# Patient Record
Sex: Male | Born: 1940
Health system: Southern US, Community
[De-identification: ages and names within clinical notes are randomized; demographics above are authoritative.]

## PROBLEM LIST (undated history)

## (undated) DIAGNOSIS — I4891 Unspecified atrial fibrillation: Secondary | ICD-10-CM

## (undated) DIAGNOSIS — I509 Heart failure, unspecified: Secondary | ICD-10-CM

## (undated) DIAGNOSIS — J189 Pneumonia, unspecified organism: Secondary | ICD-10-CM

## (undated) DIAGNOSIS — R739 Hyperglycemia, unspecified: Secondary | ICD-10-CM

## (undated) DIAGNOSIS — I639 Cerebral infarction, unspecified: Secondary | ICD-10-CM

## (undated) DIAGNOSIS — T7840XA Allergy, unspecified, initial encounter: Secondary | ICD-10-CM

## (undated) DIAGNOSIS — I10 Essential (primary) hypertension: Secondary | ICD-10-CM

## (undated) DIAGNOSIS — I447 Left bundle-branch block, unspecified: Secondary | ICD-10-CM

## (undated) DIAGNOSIS — K635 Polyp of colon: Secondary | ICD-10-CM

## (undated) DIAGNOSIS — N189 Chronic kidney disease, unspecified: Secondary | ICD-10-CM

## (undated) DIAGNOSIS — K5790 Diverticulosis of intestine, part unspecified, without perforation or abscess without bleeding: Secondary | ICD-10-CM

## (undated) DIAGNOSIS — E785 Hyperlipidemia, unspecified: Secondary | ICD-10-CM

## (undated) HISTORY — PX: KNEE ARTHROSCOPY: SUR90

## (undated) HISTORY — DX: Diverticulosis of intestine, part unspecified, without perforation or abscess without bleeding: K57.90

## (undated) HISTORY — PX: NECK SURGERY: SHX720

## (undated) HISTORY — DX: Allergy, unspecified, initial encounter: T78.40XA

## (undated) HISTORY — DX: Polyp of colon: K63.5

## (undated) HISTORY — DX: Hyperlipidemia, unspecified: E78.5

## (undated) HISTORY — DX: Unspecified atrial fibrillation: I48.91

## (undated) HISTORY — PX: AMPUTATION TOE: SHX6595

## (undated) HISTORY — PX: CATARACT EXTRACTION: SUR2

## (undated) HISTORY — PX: COLON SURGERY: SHX602

---
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
## 1999-06-24 ENCOUNTER — Emergency Department (HOSPITAL_COMMUNITY): Admission: EM | Admit: 1999-06-24 | Discharge: 1999-06-24 | Payer: Self-pay | Admitting: Emergency Medicine

## 1999-12-30 ENCOUNTER — Ambulatory Visit (HOSPITAL_COMMUNITY): Admission: RE | Admit: 1999-12-30 | Discharge: 1999-12-30 | Payer: Self-pay | Admitting: Internal Medicine

## 2000-06-22 ENCOUNTER — Emergency Department (HOSPITAL_COMMUNITY): Admission: EM | Admit: 2000-06-22 | Discharge: 2000-06-22 | Payer: Self-pay | Admitting: Emergency Medicine

## 2000-06-22 ENCOUNTER — Encounter: Payer: Self-pay | Admitting: Emergency Medicine

## 2005-03-31 ENCOUNTER — Emergency Department (HOSPITAL_COMMUNITY): Admission: EM | Admit: 2005-03-31 | Discharge: 2005-04-01 | Payer: Self-pay | Admitting: Emergency Medicine

## 2007-01-08 ENCOUNTER — Ambulatory Visit (HOSPITAL_COMMUNITY): Admission: RE | Admit: 2007-01-08 | Discharge: 2007-01-08 | Payer: Self-pay | Admitting: Ophthalmology

## 2007-01-21 ENCOUNTER — Emergency Department (HOSPITAL_COMMUNITY): Admission: EM | Admit: 2007-01-21 | Discharge: 2007-01-21 | Payer: Self-pay | Admitting: *Deleted

## 2009-06-15 ENCOUNTER — Ambulatory Visit (HOSPITAL_COMMUNITY): Admission: RE | Admit: 2009-06-15 | Discharge: 2009-06-16 | Payer: Self-pay | Admitting: Urology

## 2010-03-21 DIAGNOSIS — G589 Mononeuropathy, unspecified: Secondary | ICD-10-CM | POA: Insufficient documentation

## 2010-03-21 DIAGNOSIS — E785 Hyperlipidemia, unspecified: Secondary | ICD-10-CM | POA: Insufficient documentation

## 2010-03-21 DIAGNOSIS — N529 Male erectile dysfunction, unspecified: Secondary | ICD-10-CM | POA: Insufficient documentation

## 2010-03-21 DIAGNOSIS — E1139 Type 2 diabetes mellitus with other diabetic ophthalmic complication: Secondary | ICD-10-CM | POA: Insufficient documentation

## 2010-03-21 DIAGNOSIS — M5412 Radiculopathy, cervical region: Secondary | ICD-10-CM | POA: Insufficient documentation

## 2010-03-21 DIAGNOSIS — I10 Essential (primary) hypertension: Secondary | ICD-10-CM | POA: Insufficient documentation

## 2010-03-21 DIAGNOSIS — I447 Left bundle-branch block, unspecified: Secondary | ICD-10-CM | POA: Insufficient documentation

## 2010-03-22 ENCOUNTER — Ambulatory Visit: Payer: Self-pay | Admitting: Cardiovascular Disease

## 2010-04-06 ENCOUNTER — Telehealth (INDEPENDENT_AMBULATORY_CARE_PROVIDER_SITE_OTHER): Payer: Self-pay | Admitting: *Deleted

## 2010-04-07 ENCOUNTER — Ambulatory Visit: Payer: Self-pay

## 2010-04-07 ENCOUNTER — Encounter: Payer: Self-pay | Admitting: Cardiology

## 2010-04-07 ENCOUNTER — Encounter (HOSPITAL_COMMUNITY): Admission: RE | Admit: 2010-04-07 | Discharge: 2010-06-01 | Payer: Self-pay | Admitting: Cardiovascular Disease

## 2010-04-07 ENCOUNTER — Ambulatory Visit (HOSPITAL_COMMUNITY): Admission: RE | Admit: 2010-04-07 | Discharge: 2010-04-07 | Payer: Self-pay | Admitting: Cardiovascular Disease

## 2010-04-07 ENCOUNTER — Ambulatory Visit: Payer: Self-pay | Admitting: Cardiology

## 2010-04-07 ENCOUNTER — Encounter: Payer: Self-pay | Admitting: Cardiovascular Disease

## 2010-04-15 ENCOUNTER — Ambulatory Visit: Payer: Self-pay | Admitting: Cardiovascular Disease

## 2010-05-10 ENCOUNTER — Ambulatory Visit: Payer: Self-pay | Admitting: Cardiovascular Disease

## 2010-05-10 LAB — CONVERTED CEMR LAB
BUN: 23 mg/dL (ref 6–23)
Basophils Absolute: 0 10*3/uL (ref 0.0–0.1)
Basophils Relative: 0.4 % (ref 0.0–3.0)
CO2: 27 meq/L (ref 19–32)
Calcium: 8.8 mg/dL (ref 8.4–10.5)
Chloride: 103 meq/L (ref 96–112)
Creatinine, Ser: 1.2 mg/dL (ref 0.4–1.5)
Eosinophils Absolute: 0.1 10*3/uL (ref 0.0–0.7)
Eosinophils Relative: 2.7 % (ref 0.0–5.0)
GFR calc non Af Amer: 80.06 mL/min (ref >60)
Glucose, Bld: 147 mg/dL — ABNORMAL HIGH (ref 70–99)
HCT: 36.5 % — ABNORMAL LOW (ref 39.0–52.0)
Hemoglobin: 12.4 g/dL — ABNORMAL LOW (ref 13.0–17.0)
INR: 1 (ref 0.8–1.0)
Lymphocytes Relative: 30.2 % (ref 12.0–46.0)
Lymphs Abs: 1.7 10*3/uL (ref 0.7–4.0)
MCHC: 33.9 g/dL (ref 30.0–36.0)
MCV: 93.7 fL (ref 78.0–100.0)
Monocytes Absolute: 0.4 10*3/uL (ref 0.1–1.0)
Monocytes Relative: 6.6 % (ref 3.0–12.0)
Neutro Abs: 3.3 10*3/uL (ref 1.4–7.7)
Neutrophils Relative %: 60.1 % (ref 43.0–77.0)
Platelets: 194 10*3/uL (ref 150.0–400.0)
Potassium: 4.4 meq/L (ref 3.5–5.1)
Prothrombin Time: 10.2 s (ref 9.7–11.8)
RBC: 3.89 M/uL — ABNORMAL LOW (ref 4.22–5.81)
RDW: 14.9 % — ABNORMAL HIGH (ref 11.5–14.6)
Sodium: 142 meq/L (ref 135–145)
WBC: 5.5 10*3/uL (ref 4.5–10.5)

## 2010-05-11 ENCOUNTER — Ambulatory Visit: Payer: Self-pay | Admitting: Cardiovascular Disease

## 2010-05-11 ENCOUNTER — Inpatient Hospital Stay (HOSPITAL_BASED_OUTPATIENT_CLINIC_OR_DEPARTMENT_OTHER): Admission: RE | Admit: 2010-05-11 | Discharge: 2010-05-11 | Payer: Self-pay | Admitting: Cardiovascular Disease

## 2010-05-23 ENCOUNTER — Ambulatory Visit: Payer: Self-pay | Admitting: Cardiovascular Disease

## 2010-05-23 DIAGNOSIS — I2510 Atherosclerotic heart disease of native coronary artery without angina pectoris: Secondary | ICD-10-CM | POA: Insufficient documentation

## 2010-09-25 DIAGNOSIS — K5790 Diverticulosis of intestine, part unspecified, without perforation or abscess without bleeding: Secondary | ICD-10-CM

## 2010-09-25 DIAGNOSIS — K635 Polyp of colon: Secondary | ICD-10-CM

## 2010-09-25 HISTORY — DX: Polyp of colon: K63.5

## 2010-09-25 HISTORY — DX: Diverticulosis of intestine, part unspecified, without perforation or abscess without bleeding: K57.90

## 2010-10-25 NOTE — Assessment & Plan Note (Signed)
Summary: Cardiology Nuclear Study  Nuclear Med Background Indications for Stress Test: Evaluation for Ischemia, Abnormal EKG  Indications Comments: New LBBB  History: Echo  History Comments: 04/07/10 Echo:EF=45-50%  Symptoms: DOE, Palpitations, Rapid HR    Nuclear Pre-Procedure Cardiac Risk Factors: Family History - CAD, Hypertension, LBBB, Lipids, NIDDM, Smoker Caffeine/Decaff Intake: none NPO After: 8:30 PM Lungs: Clear.  O2 Sat 99% on RA. IV 0.9% NS with Angio Cath: 18g     IV Site: (R) AC IV Started by: Eliezer Lofts EMT-P Chest Size (in) 48     Height (in): 70 Weight (lb): 198 BMI: 28.51 Tech Comments: CBG= 148 @ 6:30 am this day, per patient.  Nuclear Med Study 1 or 2 day study:  1 day     Stress Test Type:  Adenosine Reading MD:  Kirk Ruths, MD     Referring MD:  Lauree Chandler, MD Resting Radionuclide:  Technetium 40m Tetrofosmin     Resting Radionuclide Dose:  11.0 mCi  Stress Radionuclide:  Technetium 24m Tetrofosmin     Stress Radionuclide Dose:  33.0 mCi   Stress Protocol  Dose of Adenosine:  50.4 mg    Stress Test Technologist:  Valetta Fuller CMA-N     Nuclear Technologist:  Charlton Amor CNMT  Rest Procedure  Myocardial perfusion imaging was performed at rest 45 minutes following the intravenous administration of Myoview Technetium 60m Tetrofosmin.  Stress Procedure  The patient received IV adenosine at 140 mcg/kg/min for 4 minutes. There was a brief episode of 2nd degree AVB with infusion. Myoview was injected at the 2 minute mark and quantitative spect images were obtained after a 45 minute delay.  QPS Raw Data Images:  There is no interference from other nuclear activity. Stress Images:  There is decreased uptake in the inferior wall and apex. Rest Images:  There is decreased uptake in the inferior wall and apex, less prominent compared to the stress images. Subtraction (SDS):  These findings are consistent with inferior thinning and small  prior apical infarct with very mild peri-infarct ischemia. Transient Ischemic Dilatation:  .93  (Normal <1.22)  Lung/Heart Ratio:  .33  (Normal <0.45)  Quantitative Gated Spect Images QGS EDV:  126 ml QGS ESV:  74 ml QGS EF:  41 % QGS cine images:  Apical hypokinesis; evidence of LVE.   Overall Impression  Exercise Capacity: Adenosine study with no exercise. BP Response: Normal blood pressure response. Clinical Symptoms: There is  chest pain ECG Impression: Uninterpretable due to baseline LBBB. Overall Impression: Abnormal adenosine nuclear study with inferior thinning and small prior apical infarct with mild peri-infarct ischemia.  Appended Document: Cardiology Nuclear Study Abnormal stress. I will need to see him back to discuss setting up a cath. Can we see when he is on the schedule to see me? If not next week, can we put him on to see me next week? thanks, cdm (see echo report too)  Appended Document: Cardiology Nuclear Study pt aware, appt sch for 7/22 at 9:45am

## 2010-10-25 NOTE — Assessment & Plan Note (Signed)
Summary: NP6/LBBB/SYMPTOMATIC/JML   Visit Type:  new pt visit Primary Provider:  Dr. Dagmar Hait  CC:  palpitations says he has had these all of his life..no other complaints today.  History of Present Illness: 70 yo AAM with history of hyperlipidemia, HTN, DM, neuropathy and diabetic retinopathy referred today for further evaluation of LBBB on EKG. He is a smoker and continues to smoke several cigarettes per day. He was in to see Dr. Dagmar Hait last week and had a routine EKG which showed LBBB. He has had no chest pain, SOB, dizziness, near syncope or syncope. No lower extremity edema. He has had no palpitations but is aware of his heart beating when laying on his side.  Current Medications (verified): 1)  Simvastatin 40 Mg Tabs (Simvastatin) .Marland Kitchen.. 1 Tab At Bedtime 2)  Metformin Hcl 500 Mg Tabs (Metformin Hcl) .Marland Kitchen.. 1 Tab Two Times A Day 3)  Actoplus Met 15-850 Mg Tabs (Pioglitazone Hcl-Metformin Hcl) .Marland Kitchen.. 1 Tab Three Times A Day 4)  Cozaar 100 Mg Tabs (Losartan Potassium) .... 1/2 Tab Two Times A Day  Allergies (verified): No Known Drug Allergies  Past History:  Past Medical History: Reviewed history from 03/21/2010 and no changes required. 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 Irine Seal 2011  Family History: Mother: deceased @ 29 ESRD/DM Father: deceased @ 61 accident Siblings: 26 sisters 1 brother (DM, CABG) Family History of Coronary Artery Disease:   Brother with CAD, now deceased.   Social History: Married, 3 daughters Retired -truck Geophysicist/field seismologist Tobacco Use - Yes. 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  Alcohol Use - yes, occasional No illlicit drugs  Review of Systems  The patient denies fatigue,  malaise, fever, weight gain/loss, vision loss, decreased hearing, hoarseness, chest pain, palpitations, shortness of breath, prolonged cough, wheezing, sleep apnea, coughing up blood, abdominal pain, blood in stool, nausea, vomiting, diarrhea, heartburn, incontinence, blood in urine, muscle weakness, joint pain, leg swelling, rash, skin lesions, headache, fainting, dizziness, depression, anxiety, enlarged lymph nodes, easy bruising or bleeding, and environmental allergies.    Vital Signs:  Patient profile:   70 year old male Height:      70 inches Weight:      199 pounds BMI:     28.66 Pulse rate:   84 / minute Pulse rhythm:   irregular BP sitting:   126 / 60  (left arm) Cuff size:   regular  Vitals Entered By: Julaine Hua, CMA (March 22, 2010 1:42 PM)  Physical Exam  General:  General: Well developed, well nourished, NAD HEENT: OP clear, mucus membranes moist SKIN: warm, dry Neuro: No focal deficits Musculoskeletal: Muscle strength 5/5 all ext Psychiatric: Mood and affect normal Neck: No JVD, no carotid bruits, no thyromegaly, no lymphadenopathy. Lungs:Clear bilaterally, no wheezes, rhonci, crackles CV: RRR no murmurs, gallops rubs Abdomen: soft, NT, ND, BS present Extremities: No edema, pulses 2+.    EKG  Procedure date:  03/22/2010  Findings:      NSR with sinus arrhythmia. Rate 84 bpm. LBBB  Impression & Recommendations:  Problem # 1:  LBBB (ICD-426.3)  He is asymptomatic at this time. He has no chest pain to suggest angina. I will arrange an echo to exclude structural heart disease and a Lexiscan stress myoview to assess for ischemia. I have had a long discussion with the  patient about the etiology of this rhythm disturbance.   Orders: EKG w/ Interpretation (93000) Echocardiogram (Echo)  Other Orders: Nuclear Stress Test (Nuc Stress Test)  Patient Instructions: 1)  Your physician recommends that you schedule a follow-up appointment in: 4 weeks 2)  Your  physician has requested that you have an echocardiogram.  Echocardiography is a painless test that uses sound waves to create images of your heart. It provides your doctor with information about the size and shape of your heart and how well your heart's chambers and valves are working.  This procedure takes approximately one hour. There are no restrictions for this procedure. 3)  Your physician has requested that you have an adenosine myoview.  For further information please visit [email or web address].  Please follow instruction sheet, as given.

## 2010-10-25 NOTE — Assessment & Plan Note (Signed)
Summary: 6wk f/u sl   Visit Type:  6 wk f/u Primary Provider:  Dr. Dagmar Hait  CC:  pt states he recently quit smoking....denies any cardiac complaints today.  History of Present Illness: 70 yo AAM with history of hyperlipidemia, HTN, DM, neuropathy and diabetic retinopathy seen as new patient in July 2011 for evaluation  of LBBB on EKG. He is a smoker and continues to smoke several cigarettes per day. He has no  chest pain, SOB, dizziness, near syncope or syncope. His stress test showed inferior wall thinning and apical ischemia. Overall EF of 41%. Diagnostic left heart cath was arranged and performed on May 11, 2010. He was found to mild non-obstructive CAD HIs LVEF is mildly reduced. I added Coreg twice daily. He has completely stopped smoking.     Current Medications (verified): 1)  Simvastatin 40 Mg Tabs (Simvastatin) .Marland Kitchen.. 1 Tab At Bedtime 2)  Metformin Hcl 500 Mg Tabs (Metformin Hcl) .Marland Kitchen.. 1 Tab Two Times A Day 3)  Actoplus Met 15-850 Mg Tabs (Pioglitazone Hcl-Metformin Hcl) .Marland Kitchen.. 1 Tab Three Times A Day 4)  Cozaar 100 Mg Tabs (Losartan Potassium) .... 1/2 Tab Two Times A Day 5)  Aspirin 81 Mg Tbec (Aspirin) .... Take One Tablet By Mouth Daily 6)  Carvedilol 3.125 Mg Tabs (Carvedilol) .Marland Kitchen.. 1 Tab Two Times A Day  Allergies (verified): No Known Drug Allergies  Past History:  Past Medical History: LBBB (ICD-426.3) CAD by cath-moderate with 50% mid LAD stenosis (August 2011) HYPERTENSION (ICD-401.9) DIABETIC  RETINOPATHY (ICD-250.50) NEUROPATHY (ICD-355.9) TOBACCO ABUSE (ICD-305.1) HYPERLIPIDEMIA (ICD-272.4) CERVICAL RADICULOPATHY (ICD-723.4) ORGANIC IMPOTENCE (ICD-607.84)    Social History: Reviewed history from 03/22/2010 and no changes required. Married, 3 daughters Retired -truck Geophysicist/field seismologist Tobacco Use - Yes. 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  Alcohol Use - yes, occasional No illlicit drugs  Review of Systems  The patient denies fatigue, malaise, fever, weight gain/loss, vision loss,  decreased hearing, hoarseness, chest pain, palpitations, shortness of breath, prolonged cough, wheezing, sleep apnea, coughing up blood, abdominal pain, blood in stool, nausea, vomiting, diarrhea, heartburn, incontinence, blood in urine, muscle weakness, joint pain, leg swelling, rash, skin lesions, headache, fainting, dizziness, depression, anxiety, enlarged lymph nodes, easy bruising or bleeding, and environmental allergies.    Vital Signs:  Patient profile:   70 year old male Height:      70 inches Weight:      196.4 pounds BMI:     28.28 Pulse rate:   80 / minute Pulse rhythm:   irregular BP sitting:   134 / 64  (left arm) Cuff size:   large  Vitals Entered By: Julaine Hua, CMA (May 23, 2010 11:14 AM)  Physical Exam  General:  General: Well developed, well nourished, NAD Musculoskeletal: Muscle strength 5/5 all ext Psychiatric: Mood and affect normal Neck: No JVD, no carotid bruits, no thyromegaly, no lymphadenopathy. Lungs:Clear bilaterally, no wheezes, rhonci, crackles CV: RRR no murmurs, gallops rubs Abdomen: soft, NT, ND, BS present Extremities: No edema, pulses 2+.    EKG  Procedure date:  05/23/2010  Findings:      NSR, rate 80 bpm. LBBB  Cardiac Cath  Procedure date:  05/11/2010  Findings:      HEMODYNAMIC FINDINGS:  Central aortic pressure 116/57.  Left ventricular pressure 116/30.  Left ventricular end-diastolic pressure 25.   ANGIOGRAPHIC FINDINGS: 1. The left main coronary artery had no evidence of disease.  This     artery bifurcated into the left anterior descending, ramus     intermediate branch, and  the circumflex branch. 2. The left anterior descending is a large vessel that courses to the     apex and wraps around the apex.  This vessel gives off one moderate-     sized diagonal branch and several very small-caliber diagonal     branches.  The first marginal branch has mild plaque disease.  The     other diagonal branches had mild plaque  disease.  The proximal LAD     has 20% plaque.  The mid LAD has an area of calcification with a     50% stenosis.  This does not appear to be flow limiting. 3. Circumflex artery gives off an early ramus intermediate branch,     which has mild plaque disease.  The AV groove circumflex gives off     several very small-caliber obtuse marginal branches that are free     of any significant disease.  The very distal portion of the AV     groove circumflex becomes very small caliber and appears to have a     60% stenosis. 4. The right coronary artery is a large dominant vessel that gives off     several small-caliber RV marginal branches.  The right coronary     artery has mild plaque in the proximal and midportion and     bifurcates into a posterolateral branch and a posterior descending     artery, which both have mild plaque. 5. Left ventricular angiogram was performed in the RAO projection and     showed global hypokinesis with ejection fraction of 40%.  Impression & Recommendations:  Problem # 1:  CAD, NATIVE VESSEL (ICD-414.01) He has no chest pain or SOB. He has mild to moderate disease by cath two weeks ago. Will continue ASA, Coreg and statin.  He has stopped smoking.   His updated medication list for this problem includes:    Aspirin 81 Mg Tbec (Aspirin) .Marland Kitchen... Take one tablet by mouth daily    Carvedilol 3.125 Mg Tabs (Carvedilol) .Marland Kitchen... 1 tab two times a day  Problem # 2:  LBBB (ICD-426.3) No changes.   His updated medication list for this problem includes:    Aspirin 81 Mg Tbec (Aspirin) .Marland Kitchen... Take one tablet by mouth daily    Carvedilol 3.125 Mg Tabs (Carvedilol) .Marland Kitchen... 1 tab two times a day  Patient Instructions: 1)  Your physician recommends that you schedule a follow-up appointment in 1 year. 2)  Your physician recommends that you continue on your current medications as directed. Please refer to the Current Medication list given to you today.

## 2010-10-25 NOTE — Letter (Signed)
Summary: Cardiac Catheterization Instructions- [place] [place]  [id number] [place]   [place], [place] [address]   Phone: [phone]  Fax: [phone]     04/15/2010 MRN: [id number]  [name] [address] [place], [place]  [address]  Dear [name],   You are scheduled for a Cardiac Catheterization on May 11, 2010 with Dr.McAlhany Please arrive to the 1st floor of the [facility] at [facility] at 6:30am  on the day of your procedure. Please do not arrive before 6:30 a.m. Call the Heart and Vascular Center at [phone] if you are unable to make your appointmnet. The Code to get into the parking garage under the building is 0002. Take the elevators to the 1st floor. You must have someone to drive you home. Someone must be with you for the first 24 hours after you arrive home. Please wear clothes that are easy to get on and off and wear slip-on shoes. Do not eat or drink after midnight except water with your medications that morning. Bring all your medications and current insurance cards with you.  _X__ DO NOT take these medications before your procedure: Do not take metformin or actoplus evening before and morning of procedure and for 48 hours after procedure.  _X__ Make sure you take your aspirin. On day of procedure take 4 81 mg aspirin tablets.  _X__ You may take ALL of your remaining medications with water that morning. ________________________________________________________________________________________________________________________________  Come to the [facility] office on [facility]. on May 09, 2010 for lab work.  The usual length of stay after your procedure is 2 to 3 hours. This can vary.  If you have any questions, please call the office at the number listed above.   Thompson Grayer, RN, BSN

## 2010-10-25 NOTE — Assessment & Plan Note (Signed)
Summary: abn myoview/hms   Visit Type:  Follow-up Primary Provider:  Dr. Dagmar Hait  CC:  Abnormal myoview.  History of Present Illness: 70 yo AAM with history of hyperlipidemia, HTN, DM, neuropathy and diabetic retinopathy herereferred today for further evaluation of LBBB on EKG. He is a smoker and continues to smoke several cigarettes per day. He was in to see Dr. Dagmar Hait last week and had a routine EKG which showed LBBB. He has had no chest pain, SOB, dizziness, near syncope or syncope. No lower extremity edema. He has had no palpitations but is aware of his heart beating when laying on his side.  His stress test showed inferior wall thinning and apical ischemia. Overall EF of 41%. He has no complaints.    Current Medications (verified): 1)  Simvastatin 40 Mg Tabs (Simvastatin) .Marland Kitchen.. 1 Tab At Bedtime 2)  Metformin Hcl 500 Mg Tabs (Metformin Hcl) .Marland Kitchen.. 1 Tab Two Times A Day 3)  Actoplus Met 15-850 Mg Tabs (Pioglitazone Hcl-Metformin Hcl) .Marland Kitchen.. 1 Tab Three Times A Day 4)  Cozaar 100 Mg Tabs (Losartan Potassium) .... 1/2 Tab Two Times A Day  Allergies (verified): No Known Drug Allergies  Past History:  Past Medical History: Reviewed history from 03/21/2010 and no changes required. LBBB (ICD-426.3) HYPERTENSION (ICD-401.9) DIABETIC  RETINOPATHY (ICD-250.50) NEUROPATHY (ICD-355.9) TOBACCO ABUSE (ICD-305.1) HYPERLIPIDEMIA (ICD-272.4) CERVICAL RADICULOPATHY (ICD-723.4) ORGANIC IMPOTENCE [id number])    Past Surgical History: Reviewed history from 03/22/2010 and no changes required. L Inguinal hernia repair Neck surgery Cataract Extraction both  eyes Penile implant .Marland KitchenMarland KitchenDr. Irine Seal 2011  Family History: Reviewed history from 03/22/2010 and no changes required. Mother: deceased @ 13 ESRD/DM Father: deceased @ 5 accident Siblings: 69 sisters 1 brother (DM, CABG) Family History of Coronary Artery Disease:   Brother with CAD, now deceased.   Social History: Reviewed history from  03/22/2010 and no changes required. Married, 3 daughters Retired -truck Geophysicist/field seismologist Tobacco Use - Yes. 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  Alcohol Use - yes, occasional No illlicit drugs  Review of Systems  The patient denies fatigue, malaise, fever, weight gain/loss, vision loss, decreased hearing, hoarseness, chest pain, palpitations, shortness of breath, prolonged cough, wheezing, sleep apnea, coughing up blood, abdominal pain, blood in stool, nausea, vomiting, diarrhea, heartburn, incontinence, blood in urine, muscle weakness, joint pain, leg swelling, rash, skin lesions, headache, fainting, dizziness, depression, anxiety, enlarged lymph nodes, easy bruising or bleeding, and environmental allergies.    Vital Signs:  Patient profile:   70 year old male Height:      70 inches Weight:      199.50 pounds BMI:     28.73 Pulse rate:   80 / minute Pulse rhythm:   irregular Resp:     18 per minute BP sitting:   130 / 66  (left arm) Cuff size:   large  Vitals Entered By: Sidney Ace (April 15, 2010 9:32 AM)  Physical Exam  General:  General: Well developed, well nourished, NAD HEENT: OP clear, mucus membranes moist SKIN: warm, dry Neuro: No focal deficits Musculoskeletal: Muscle strength 5/5 all ext Psychiatric: Mood and affect normal Neck: No JVD, no carotid bruits, no thyromegaly, no lymphadenopathy. Lungs:Clear bilaterally, no wheezes, rhonci, crackles CV: RRR no murmurs, gallops rubs Abdomen: soft, NT, ND, BS present Extremities: No edema, pulses 2+.    Nuclear Study  Procedure date:  04/07/2010  Findings:      Exercise Capacity: Adenosine study with no exercise. BP Response: Normal blood pressure response. Clinical Symptoms: There is  chest pain  ECG Impression: Uninterpretable due to baseline LBBB. Overall Impression: Abnormal adenosine nuclear study with inferior thinning and small prior apical infarct with mild peri-infarct ischemia.  Echocardiogram  Procedure date:   04/07/2010  Findings:      Study Conclusions            - Left ventricle: The cavity size was normal. Wall thickness was       increased in a pattern of mild LVH. There was mild focal basal       hypertrophy of the septum. Systolic function was mildly reduced.       The estimated ejection fraction was in the range of 45% to 50%.       Diffuse hypokinesis. Doppler parameters are consistent with       abnormal left ventricular relaxation (grade 1 diastolic       dysfunction).     - Ventricular septum: These changes are consistent with a left       bundle branch block.     - Left atrium: The atrium was mildly dilated.  Impression & Recommendations:  Problem # 1:  LBBB (ICD-426.3)  Abnormal myoview with possible ischemia, depressed LV function. Will arrange left heart cath to assess for CAD. Risks and benefits reviewed. Will arrange for August 17th in the outpatient cath lab. BMET, CBC and coags the week of procedure.  His updated medication list for this problem includes:    Aspirin 81 Mg Tbec (Aspirin) .Marland Kitchen... Take one tablet by mouth daily  Problem # 2:  HYPERTENSION (ICD-401.9)  Well controlled.   His updated medication list for this problem includes:    Cozaar 100 Mg Tabs (Losartan potassium) .Marland Kitchen... 1/2 tab two times a day    Aspirin 81 Mg Tbec (Aspirin) .Marland Kitchen... Take one tablet by mouth daily  Orders: Cardiac Catheterization (Cardiac Cath)  Patient Instructions: 1)  Your physician recommends that you schedule a follow-up appointment in: 6 weeks 2)  Your physician recommends that you return for lab work on May 09, 2010 3)  Your physician has recommended you make the following change in your medication: Start aspirin 81 mg by mouth daily. 4)  Your physician has requested that you have a cardiac catheterization.  Cardiac catheterization is used to diagnose and/or treat various heart conditions. Doctors may recommend this procedure for a number of different reasons. The most common  reason is to evaluate chest pain. Chest pain can be a symptom of coronary artery disease (CAD), and cardiac catheterization can show whether plaque is narrowing or blocking your heart's arteries. This procedure is also used to evaluate the valves, as well as measure the blood flow and oxygen levels in different parts of your heart.  For further information please visit [email or web address].  Please follow instruction sheet, as given.

## 2010-10-25 NOTE — Progress Notes (Signed)
Summary: Nuclear Pre-Procedure  Phone Note Outgoing Call   Call placed by: Perrin Maltese, EMT-P,  April 06, 2010 3:28 PM Summary of Call: .nuccall     Nuclear Med Background Indications for Stress Test: Evaluation for Ischemia, Abnormal EKG     Symptoms: Palpitations    Nuclear Pre-Procedure Cardiac Risk Factors: Family History - CAD, Hypertension, LBBB, Lipids, NIDDM, Smoker Height (in): 37  Nuclear Med Study Referring MD:  C.McAlhany

## 2010-12-30 LAB — BASIC METABOLIC PANEL
BUN: 20 mg/dL (ref 6–23)
BUN: 28 mg/dL — ABNORMAL HIGH (ref 6–23)
CO2: 27 mEq/L (ref 19–32)
CO2: 27 mEq/L (ref 19–32)
Calcium: 8.3 mg/dL — ABNORMAL LOW (ref 8.4–10.5)
Calcium: 9 mg/dL (ref 8.4–10.5)
Chloride: 105 mEq/L (ref 96–112)
Chloride: 108 mEq/L (ref 96–112)
Creatinine, Ser: 1.16 mg/dL (ref 0.4–1.5)
Creatinine, Ser: 1.35 mg/dL (ref 0.4–1.5)
GFR calc Af Amer: >60 mL/min (ref >60)
GFR calc Af Amer: >60 mL/min (ref >60)
GFR calc non Af Amer: 53 mL/min — ABNORMAL LOW (ref >60)
GFR calc non Af Amer: >60 mL/min (ref >60)
Glucose, Bld: 173 mg/dL — ABNORMAL HIGH (ref 70–99)
Glucose, Bld: 204 mg/dL — ABNORMAL HIGH (ref 70–99)
Potassium: 4.3 mEq/L (ref 3.5–5.1)
Potassium: 4.6 mEq/L (ref 3.5–5.1)
Sodium: 139 mEq/L (ref 135–145)
Sodium: 140 mEq/L (ref 135–145)

## 2010-12-30 LAB — GLUCOSE, CAPILLARY
Glucose-Capillary: 120 mg/dL — ABNORMAL HIGH (ref 70–99)
Glucose-Capillary: 124 mg/dL — ABNORMAL HIGH (ref 70–99)
Glucose-Capillary: 133 mg/dL — ABNORMAL HIGH (ref 70–99)
Glucose-Capillary: 162 mg/dL — ABNORMAL HIGH (ref 70–99)
Glucose-Capillary: 190 mg/dL — ABNORMAL HIGH (ref 70–99)

## 2010-12-30 LAB — HEMOGLOBIN AND HEMATOCRIT, BLOOD
HCT: 39.9 % (ref 39.0–52.0)
Hemoglobin: 13.4 g/dL (ref 13.0–17.0)

## 2011-07-27 ENCOUNTER — Encounter: Payer: Self-pay | Admitting: Gastroenterology

## 2011-07-27 ENCOUNTER — Ambulatory Visit (AMBULATORY_SURGERY_CENTER): Payer: Medicare HMO | Admitting: *Deleted

## 2011-07-27 VITALS — Ht 70.5 in | Wt 185.8 lb

## 2011-07-27 DIAGNOSIS — K921 Melena: Secondary | ICD-10-CM

## 2011-07-27 MED ORDER — PEG-KCL-NACL-NASULF-NA ASC-C 100 G PO SOLR: 1.0000 | Freq: Once | ORAL | Status: DC

## 2011-07-27 NOTE — Progress Notes (Signed)
Pt will bring in name of his cholesterol medication the day of the procedure

## 2011-08-10 ENCOUNTER — Encounter: Payer: Self-pay | Admitting: Gastroenterology

## 2011-08-10 ENCOUNTER — Ambulatory Visit (AMBULATORY_SURGERY_CENTER): Payer: Medicare PPO | Admitting: Gastroenterology

## 2011-08-10 VITALS — BP 150/87 | HR 70 | Temp 97.0°F | Resp 15 | Ht 70.0 in | Wt 185.0 lb

## 2011-08-10 DIAGNOSIS — K5730 Diverticulosis of large intestine without perforation or abscess without bleeding: Secondary | ICD-10-CM

## 2011-08-10 DIAGNOSIS — D126 Benign neoplasm of colon, unspecified: Secondary | ICD-10-CM

## 2011-08-10 DIAGNOSIS — K921 Melena: Secondary | ICD-10-CM

## 2011-08-10 LAB — GLUCOSE, CAPILLARY
Glucose-Capillary: 134 mg/dL — ABNORMAL HIGH (ref 70–99)
Glucose-Capillary: 129 mg/dL — ABNORMAL HIGH (ref 70–99)

## 2011-08-10 MED ORDER — SODIUM CHLORIDE 0.9 % IV SOLN: 500.0000 mL | INTRAVENOUS | Status: DC

## 2011-08-10 NOTE — Patient Instructions (Addendum)
Colon Polyps A polyp is extra tissue that grows inside your body. Colon polyps grow in the large intestine. The large intestine, also called the colon, is part of your digestive system. It is a long, hollow tube at the end of your digestive tract where your body makes and stores stool. Most polyps are not dangerous. They are benign. This means they are not cancerous. But over time, some types of polyps can turn into cancer. Polyps that are smaller than a pea are usually not harmful. But larger polyps could someday become or may already be cancerous. To be safe, doctors remove all polyps and test them.  WHO GETS POLYPS? Anyone can get polyps, but certain people are more likely than others. You may have a greater chance of getting polyps if:  You are over 50.   You have had polyps before.   Someone in your family has had polyps.   Someone in your family has had cancer of the large intestine.   Find out if someone in your family has had polyps. You may also be more likely to get polyps if you:   Eat a lot of fatty foods.   Smoke.   Drink alcohol.   Do not exercise.   Eat too much.  SYMPTOMS  Most small polyps do not cause symptoms. People often do not know they have one until their caregiver finds it during a regular checkup or while testing them for something else. Some people do have symptoms like these:  Bleeding from the anus. You might notice blood on your underwear or on toilet paper after you have had a bowel movement.   Constipation or diarrhea that lasts more than a week.   Blood in the stool. Blood can make stool look black or it can show up as red streaks in the stool.  If you have any of these symptoms, see your caregiver. HOW DOES THE DOCTOR TEST FOR POLYPS? The doctor can use four tests to check for polyps:  Digital rectal exam. The caregiver wears gloves and checks your rectum (the last part of the large intestine) to see if it feels normal. This test would find  polyps only in the rectum. Your caregiver may need to do one of the other tests listed below to find polyps higher up in the intestine.   Barium enema. The caregiver puts a liquid called barium into your rectum before taking x-rays of your large intestine. Barium makes your intestine look white in the pictures. Polyps are dark, so they are easy to see.   Sigmoidoscopy. With this test, the caregiver can see inside your large intestine. A thin flexible tube is placed into your rectum. The device is called a sigmoidoscope, which has a light and a tiny video camera in it. The caregiver uses the sigmoidoscope to look at the last third of your large intestine.   Colonoscopy. This test is like sigmoidoscopy, but the caregiver looks at all of the large intestine. It usually requires sedation. This is the most common method for finding and removing polyps.  TREATMENT   The caregiver will remove the polyp during sigmoidoscopy or colonoscopy. The polyp is then tested for cancer.   If you have had polyps, your caregiver may want you to get tested regularly in the future.  PREVENTION  There is not one sure way to prevent polyps. You might be able to lower your risk of getting them if you:  Eat more fruits and vegetables and less fatty  food.   Do not smoke.   Avoid alcohol.   Exercise every day.   Lose weight if you are overweight.   Eating more calcium and folate can also lower your risk of getting polyps. Some foods that are rich in calcium are milk, cheese, and broccoli. Some foods that are rich in folate are chickpeas, kidney beans, and spinach.   Aspirin might help prevent polyps. Studies are under way.  Document Released: 06/07/2004 Document Revised: 05/24/2011 Document Reviewed: 11/13/2007 [facility] Patient Information 2012 [place]  .Diverticulosis Diverticulosis is a common condition that develops when small pouches (diverticula) form in the wall of the colon. The risk of  diverticulosis increases with age. It happens more often in people who eat a low-fiber diet. Most individuals with diverticulosis have no symptoms. Those individuals with symptoms usually experience abdominal pain, constipation, or loose stools (diarrhea). HOME CARE INSTRUCTIONS   Increase the amount of fiber in your diet as directed by your caregiver or dietician. This may reduce symptoms of diverticulosis.   Your caregiver may recommend taking a dietary fiber supplement.   Drink at least 6 to 8 glasses of water each day to prevent constipation.   Try not to strain when you have a bowel movement.   Your caregiver may recommend avoiding nuts and seeds to prevent complications, although this is still an uncertain benefit.   Only take over-the-counter or prescription medicines for pain, discomfort, or fever as directed by your caregiver.  FOODS WITH HIGH FIBER CONTENT INCLUDE:  Fruits. Apple, peach, pear, tangerine, raisins, prunes.   Vegetables. Brussels sprouts, asparagus, broccoli, cabbage, carrot, cauliflower, romaine lettuce, spinach, summer squash, tomato, winter squash, zucchini.   Starchy Vegetables. Baked beans, kidney beans, lima beans, split peas, lentils, potatoes (with skin).   Grains. Whole wheat bread, brown rice, bran flake cereal, plain oatmeal, white rice, shredded wheat, bran muffins.  SEEK IMMEDIATE MEDICAL CARE IF:   You develop increasing pain or severe bloating.   You have an oral temperature above 102 F (38.9 C), not controlled by medicine.   You develop vomiting or bowel movements that are bloody or black.  Document Released: 06/08/2004 Document Revised: 05/24/2011 Document Reviewed: 02/09/2010 [facility] Patient Information 2012 [place].  Your polyp results will be mailed to you within 2 weeks.   You may resume your routine medications EXCEPT ASPIRIN AND NSAIDS. No nsaids or aspirin for 2 weeks.   Try to increase the fiber in your diet due to your  diverticulosis.   Please, call [phone] if you have any questions.

## 2011-08-11 ENCOUNTER — Telehealth: Payer: Self-pay

## 2011-08-11 NOTE — Telephone Encounter (Signed)

## 2011-08-16 ENCOUNTER — Telehealth: Payer: Self-pay

## 2011-08-16 NOTE — Telephone Encounter (Signed)
Pt scheduled for follow-up with Dr. Deatra Ina for 08/25/11@9 :30am. Pt aware of appt date and time.

## 2011-08-16 NOTE — Telephone Encounter (Signed)
Message copied by Faythe Casa on Wed Aug 16, 2011 12:02 PM ------      Message from: Erskine Emery D      Created: Wed Aug 16, 2011 11:27 AM       Pt needs an OV next week to discuss theraputic options.  Please inform him that there is no cancer.

## 2011-08-16 NOTE — Telephone Encounter (Signed)
Pt informed there is no cancer; he verbalized understanding.

## 2011-08-25 ENCOUNTER — Encounter: Payer: Self-pay | Admitting: Gastroenterology

## 2011-08-25 ENCOUNTER — Ambulatory Visit (INDEPENDENT_AMBULATORY_CARE_PROVIDER_SITE_OTHER): Payer: Medicare HMO | Admitting: Gastroenterology

## 2011-08-25 VITALS — BP 144/76 | HR 68 | Ht 70.0 in | Wt 193.0 lb

## 2011-08-25 DIAGNOSIS — D126 Benign neoplasm of colon, unspecified: Secondary | ICD-10-CM

## 2011-08-25 NOTE — Assessment & Plan Note (Signed)
He has a large, sessile right colon polyp. Options for removal were explained including surgical excision versus endoscopic mucosal resection. The risks of bleeding and perforation with the latter were explained to the patient. He wishes to proceed with colonoscopy and polyp removal within the next couple of months.

## 2011-08-25 NOTE — Patient Instructions (Signed)
You will need to call the office and speak to either Eleesha Purkey or Vaughan Basta to schedule your hospital Colonoscopy [phone]

## 2011-08-25 NOTE — Progress Notes (Signed)
History of Present Illness: [name] has returned following screening colonoscopy for Hemoccult positive stools. Multiple adenomatous polyps were seen and removed. A large right colon polyp was identified and biopsied only. Adenomatous changes only were seen. He has no chest complaints including abdominal pain, bleeding or change in bowel habits.    Past Medical History  Diagnosis Date  . Diabetes mellitus   . Hyperlipidemia   . Allergy   . Cataract   . Colon polyps 2012    Colonoscopy  . Diverticulosis 2012    Colonoscopy    Past Surgical History  Procedure Date  . Cataract extraction     both eyes  . Neck surgery   . Colon surgery     to repair intestines as child  . Knee arthroscopy     left   family history is negative for Colon cancer, and Esophageal cancer, and Stomach cancer, . Current Outpatient Prescriptions  Medication Sig Dispense Refill  . carvedilol (COREG) 3.125 MG tablet Take 3.125 mg by mouth 2 (two) times daily with a meal.        . losartan (COZAAR) 100 MG tablet Take 100 mg by mouth. Take 1/2 tablet twice daily       . metFORMIN (GLUCOPHAGE) 500 MG tablet Take 500 mg by mouth 2 (two) times daily with a meal.        . simvastatin (ZOCOR) 40 MG tablet Take 40 mg by mouth at bedtime.         Allergies as of 08/25/2011  . (No Known Allergies)    reports that he has been smoking.  He has never used smokeless tobacco. He reports that he does not drink alcohol or use illicit drugs.     Review of Systems: Pertinent positive and negative review of systems were noted in the above HPI section. All other review of systems were otherwise negative.  Vital signs were reviewed in today's medical record Physical Exam: General: Well developed , well nourished, no acute distress Head: Normocephalic and atraumatic Eyes:  sclerae anicteric, EOMI Ears: Normal auditory acuity Mouth: No deformity or lesions Neck: Supple, no masses or thyromegaly Lungs: Clear throughout  to auscultation Heart: Regular rate and rhythm; no murmurs, rubs or bruits Abdomen: Soft, non tender and non distended. No masses, hepatosplenomegaly or hernias noted. Normal Bowel sounds Rectal:deferred Musculoskeletal: Symmetrical with no gross deformities  Skin: No lesions on visible extremities Pulses:  Normal pulses noted Extremities: No clubbing, cyanosis, edema or deformities noted Neurological: Alert oriented x 4, grossly nonfocal Cervical Nodes:  No significant cervical adenopathy Inguinal Nodes: No significant inguinal adenopathy Psychological:  Alert and cooperative. Normal mood and affect

## 2011-08-31 ENCOUNTER — Ambulatory Visit: Payer: Medicare HMO | Admitting: Gastroenterology

## 2011-10-03 ENCOUNTER — Telehealth: Payer: Self-pay | Admitting: Gastroenterology

## 2011-10-04 NOTE — Telephone Encounter (Signed)
Spoke with patient and told him Dr. Kelby Fam nurse was working on scheduling him and we will call him back with an appointment.

## 2011-10-05 ENCOUNTER — Other Ambulatory Visit: Payer: Self-pay | Admitting: Gastroenterology

## 2011-10-05 MED ORDER — PEG-KCL-NACL-NASULF-NA ASC-C 100 G PO SOLR: 1.0000 | Freq: Once | ORAL | Status: DC

## 2011-10-05 NOTE — Telephone Encounter (Signed)
Pt scheduled for colon with erbe @[facility]  10/20/11 pt to arrive at 11:30am, procedure time 12:30pm. Moviprep rx sent to pharmacy and instructions mailed to pt.

## 2011-10-20 ENCOUNTER — Encounter (HOSPITAL_COMMUNITY)
Admission: RE | Disposition: A | Discharge status: Home / Self Care | Payer: Self-pay | Source: Ambulatory Visit | Attending: Gastroenterology

## 2011-10-20 ENCOUNTER — Other Ambulatory Visit: Payer: Self-pay | Admitting: Obstetrics & Gynecology

## 2011-10-20 ENCOUNTER — Encounter (HOSPITAL_COMMUNITY): Payer: Self-pay | Admitting: *Deleted

## 2011-10-20 ENCOUNTER — Ambulatory Visit (HOSPITAL_COMMUNITY)
Admission: RE | Admit: 2011-10-20 | Discharge: 2011-10-20 | Disposition: A | Discharge status: Home / Self Care | Payer: Medicare HMO | Source: Ambulatory Visit | Attending: Gastroenterology | Admitting: Gastroenterology

## 2011-10-20 DIAGNOSIS — D371 Neoplasm of uncertain behavior of stomach: Secondary | ICD-10-CM | POA: Insufficient documentation

## 2011-10-20 DIAGNOSIS — D378 Neoplasm of uncertain behavior of other specified digestive organs: Secondary | ICD-10-CM | POA: Insufficient documentation

## 2011-10-20 DIAGNOSIS — E785 Hyperlipidemia, unspecified: Secondary | ICD-10-CM | POA: Insufficient documentation

## 2011-10-20 DIAGNOSIS — D126 Benign neoplasm of colon, unspecified: Secondary | ICD-10-CM

## 2011-10-20 DIAGNOSIS — K5730 Diverticulosis of large intestine without perforation or abscess without bleeding: Secondary | ICD-10-CM | POA: Insufficient documentation

## 2011-10-20 DIAGNOSIS — Z79899 Other long term (current) drug therapy: Secondary | ICD-10-CM | POA: Insufficient documentation

## 2011-10-20 DIAGNOSIS — E119 Type 2 diabetes mellitus without complications: Secondary | ICD-10-CM | POA: Insufficient documentation

## 2011-10-20 HISTORY — PX: HOT HEMOSTASIS: SHX5433

## 2011-10-20 HISTORY — PX: COLONOSCOPY: SHX5424

## 2011-10-20 LAB — GLUCOSE, CAPILLARY: Glucose-Capillary: 141 mg/dL — ABNORMAL HIGH (ref 70–99)

## 2011-10-20 SURGERY — COLONOSCOPY
Anesthesia: Moderate Sedation

## 2011-10-20 MED ORDER — MIDAZOLAM HCL 2 MG/2ML IJ SOLN
INTRAMUSCULAR | Status: AC
  Filled 2011-10-20: qty 2

## 2011-10-20 MED ORDER — METHYLENE BLUE 1 % INJ SOLN
INTRAMUSCULAR | Status: DC | PRN
  Administered 2011-10-20: 4 mL via SUBMUCOSAL

## 2011-10-20 MED ORDER — MIDAZOLAM HCL 5 MG/5ML IJ SOLN
INTRAMUSCULAR | Status: DC | PRN
  Administered 2011-10-20 (×3): 2 mg via INTRAVENOUS
  Administered 2011-10-20: 1 mg via INTRAVENOUS
  Administered 2011-10-20 (×3): 2 mg via INTRAVENOUS

## 2011-10-20 MED ORDER — FENTANYL CITRATE 0.05 MG/ML IJ SOLN
INTRAMUSCULAR | Status: AC
  Filled 2011-10-20: qty 2

## 2011-10-20 MED ORDER — METHYLENE BLUE 1 % INJ SOLN
INTRAMUSCULAR | Status: AC
  Filled 2011-10-20: qty 10

## 2011-10-20 MED ORDER — MIDAZOLAM HCL 10 MG/2ML IJ SOLN
INTRAMUSCULAR | Status: AC
  Filled 2011-10-20: qty 2

## 2011-10-20 MED ORDER — FENTANYL NICU IV SYRINGE 50 MCG/ML
INJECTION | INTRAMUSCULAR | Status: DC | PRN
  Administered 2011-10-20 (×6): 25 ug via INTRAVENOUS

## 2011-10-20 MED ORDER — SODIUM CHLORIDE 0.9 % IV SOLN
Freq: Once | INTRAVENOUS | Status: AC
  Administered 2011-10-20: 500 mL via INTRAVENOUS

## 2011-10-20 NOTE — Op Note (Signed)
[facility] [place], [place]  [address]  COLONOSCOPY PROCEDURE REPORT  PATIENT:  [name], [name]  MR#:  [id number] BIRTHDATE:  01/10/1941, 69 yrs. old  GENDER:  male ENDOSCOPIST:  Sandy Salaam. Deatra Ina, MD REF. BY:  Prince Solian, M.D. PROCEDURE DATE:  10/20/2011 PROCEDURE:  Colonoscopy with polypectomy and submucosal injection, Colonoscopy with snare polypectomy, Colonoscopy with tumor ablation ASA CLASS:  Class II INDICATIONS:  excision of known colonic polyps MEDICATIONS:   These medications were titrated to patient response per physician's verbal order, Fentanyl 150 mcg IV, Versed 13 mg IV  DESCRIPTION OF PROCEDURE:   After the risks benefits and alternatives of the procedure were thoroughly explained, informed consent was obtained.  Digital rectal exam was performed and revealed no abnormalities.   The Pentax Colonoscope [id number] endoscope was introduced through the anus and advanced to the cecum, which was identified by both the appendix and ileocecal valve, without limitations.  The quality of the prep was excellent, using MoviPrep.  The instrument was then slowly withdrawn as the colon was fully examined. <<PROCEDUREIMAGES>>  FINDINGS:  A sessile polyp was found in the ascending colon. Large, sessile 3-4 cm polyp extending over a fold the polyp was viewed in both the cord and retroflex view. 4 cc of normal saline was injected submucosally in all areas surrounding the polyp. Nasal polypectomy was then performed utilizing electrocautery. The remnants of the polyp were then fold rise utilizing the argon coagulator. All specimens were then entered and removed and submitted to pathology (see image5, image6, and image3).  A sessile polyp was found in the cecum (see image2). 3 mm sessile polyp-removed with hot polypectomy snare and submitted to pathology  A sessile polyp was found in the proximal transverse colon (see image10). 5-6 mm sessile polyp removed  with hot polypectomy snare and submitted to pathology  Scattered diverticula were found. there is scattered diverticula in the proximal colon  This was otherwise a normal examination of the colon.   Retroflexed views in the rectum revealed no abnormalities.    The time to cecum =  minutes. The scope was then withdrawn in  1) 45  minutes from the cecum and the procedure completed. COMPLICATIONS:  None ENDOSCOPIC IMPRESSION: 1) Sessile polyp in the ascending colon 2) Sessile polyp in the cecum 3) Sessile polyp in the proximal transverse colon 4) Diverticula, scattered 5) Otherwise normal examination RECOMMENDATIONS: 1) Await biopsy results REPEAT EXAM:   You will receive a letter from Dr. Deatra Ina in 1-2 weeks, after reviewing the final pathology, with followup recommendations.  ______________________________ Sandy Salaam Deatra Ina, MD  CC:  n. eSIGNED:   Sandy Salaam. Vesna Kable at 10/20/2011 02:13 PM  [name], [id number]

## 2011-10-20 NOTE — H&P (Addendum)
[name]   08/25/2011 9:30 AM Office Visit  MRN: [id number]   Description: 71 year old male  Provider: Erskine Emery, MD  Department: [facility]        Diagnoses     Benign neoplasm of colon   - Primary    211.3      Reason for Visit     Follow-up    F/u from colon. Patient denies any GI complaints        Reason For Visit History Recorded        Vitals - Last Recorded       BP Pulse Ht Wt BMI    144/76  68  5\' 10"  (1.778 m)  87.544 kg (193 lb)  27.69 kg/m2       Progress Notes     Erskine Emery, MD  08/28/2011  8:59 AM  Signed History of Present Illness: [name] has returned following screening colonoscopy for Hemoccult positive stools. Multiple adenomatous polyps were seen and removed. A large right colon polyp was identified and biopsied only. Adenomatous changes only were seen. He has no chest complaints including abdominal pain, bleeding or change in bowel habits.        Past Medical History   Diagnosis  Date   .  Diabetes mellitus     .  Hyperlipidemia     .  Allergy     .  Cataract     .  Colon polyps  2012       Colonoscopy   .  Diverticulosis  2012       Colonoscopy     Past Surgical History   Procedure  Date   .  Cataract extraction         both eyes   .  Neck surgery     .  Colon surgery         to repair intestines as child   .  Knee arthroscopy         left    family history is negative for Colon cancer, and Esophageal cancer, and Stomach cancer, . Current Outpatient Prescriptions   Medication  Sig  Dispense  Refill   .  carvedilol (COREG) 3.125 MG tablet  Take 3.125 mg by mouth 2 (two) times daily with a meal.           .  losartan (COZAAR) 100 MG tablet  Take 100 mg by mouth. Take 1/2 tablet twice daily          .  metFORMIN (GLUCOPHAGE) 500 MG tablet  Take 500 mg by mouth 2 (two) times daily with a meal.           .  simvastatin (ZOCOR) 40 MG tablet  Take 40 mg by mouth at bedtime.            Allergies as of  08/25/2011   .  (No Known Allergies)    reports that he has been smoking.  He has never used smokeless tobacco. He reports that he does not drink alcohol or use illicit drugs.         Review of Systems: Pertinent positive and negative review of systems were noted in the above HPI section. All other review of systems were otherwise negative.   Vital signs were reviewed in today's medical record Physical Exam: General: Well developed , well nourished, no acute distress Head: Normocephalic and atraumatic Eyes:  sclerae anicteric, EOMI Ears: Normal auditory acuity Mouth:  No deformity or lesions Neck: Supple, no masses or thyromegaly Lungs: Clear throughout to auscultation Heart: Regular rate and rhythm; no murmurs, rubs or bruits Abdomen: Soft, non tender and non distended. No masses, hepatosplenomegaly or hernias noted. Normal Bowel sounds Rectal:deferred Musculoskeletal: Symmetrical with no gross deformities   Skin: No lesions on visible extremities Pulses:  Normal pulses noted Extremities: No clubbing, cyanosis, edema or deformities noted Neurological: Alert oriented x 4, grossly nonfocal Cervical Nodes:  No significant cervical adenopathy Inguinal Nodes: No significant inguinal adenopathy Psychological:  Alert and cooperative. Normal mood and affect                  Benign neoplasm of colon - Erskine Emery, MD  08/25/2011 10:19 AM  Signed He has a large, sessile right colon polyp. Options for removal were explained including surgical excision versus endoscopic mucosal resection. The risks of bleeding and perforation with the latter were explained to the patient. He wishes to proceed with colonoscopy and polyp removal within the next couple of months.     Electronic signature on 08/25/2011        Not recorded             Patient Instructions     You will need to call the office and speak to either Robin or Vaughan Basta to schedule your hospital Colonoscopy  [phone]       Level of Service     PR OFFICE CONSULTATION,LEVEL IV [id number]         All Flowsheet Templates (all recorded)     Encounter Cook    Anthropometrics Flowsheet               Referring Provider          Tivis Ringer, MD          Other Encounter Related Information     Allergies & Medications         Problem List         History         Patient-Entered Questionnaires     No data filed         The recent H&P (dated *08/19/11**) was reviewed, the patient was examined and there is no change in the patients condition since that H&P was completed.   Erskine Emery  10/20/2011, 12:41 PM

## 2011-10-23 ENCOUNTER — Encounter (HOSPITAL_COMMUNITY): Payer: Self-pay | Admitting: Gastroenterology

## 2011-10-27 NOTE — H&P (Signed)
[name] is a 71 year old African male a large, benign left colon polyp seen by prior colonoscopy in November, 2012. He is here for colonoscopy and polypectomy.  Physical exam is unremarkable including normal chest cardiac and abdominal exam.  Impression-large right colon polp  Plan-colonoscopy

## 2011-11-07 ENCOUNTER — Ambulatory Visit (INDEPENDENT_AMBULATORY_CARE_PROVIDER_SITE_OTHER): Payer: Medicare HMO | Admitting: Gastroenterology

## 2011-11-07 ENCOUNTER — Encounter: Payer: Self-pay | Admitting: Gastroenterology

## 2011-11-07 VITALS — BP 146/72 | HR 80 | Ht 70.0 in | Wt 189.0 lb

## 2011-11-07 DIAGNOSIS — D126 Benign neoplasm of colon, unspecified: Secondary | ICD-10-CM

## 2011-11-07 NOTE — Assessment & Plan Note (Addendum)
A large colon polyp in the descending colon was resected. Remnants were fulgurated utilizing the ATC. 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 I explained the findings to the patient and reviewed options including surgical resection of the area versus continued colonoscopic surveillance for polyp remnants and recurrence. He has chosen the latter.  Recommendations #1 as per discussed above I will repeat his colonoscopy in 3 months

## 2011-11-07 NOTE — Patient Instructions (Addendum)
You have been given a separate informational sheet regarding your tobacco use, the importance of quitting and local resources to help you quit. You will need to call back in 3 months to schedule your Colonoscopy for the [facility]

## 2011-11-07 NOTE — Progress Notes (Signed)
History of Present Illness:  [name] has returned following colonoscopy where he underwent EMR for a large right colon polyp. 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 He has no GI complaints.    Review of Systems: Pertinent positive and negative review of systems were noted in the above HPI section. All other review of systems were otherwise negative.    Current Medications, Allergies, Past Medical History, Past Surgical History, Family History and Social History were reviewed in [facility] record  Vital signs were reviewed in today's medical record. Physical Exam: General: Well developed , well nourished, no acute distress

## 2012-01-24 ENCOUNTER — Telehealth: Payer: Self-pay | Admitting: *Deleted

## 2012-01-24 NOTE — Telephone Encounter (Signed)
Message copied by Oda Kilts on Wed Jan 24, 2012  8:31 AM ------      Message from: Oda Kilts      Created: Tue Nov 07, 2011 10:41 AM      Regarding: colon with erby       MAKE SURE PT CALL TO SCHEDULE HIS COLON/ERBY FOR MAY

## 2012-02-06 NOTE — Telephone Encounter (Signed)
Patient wants to decide about when to have this. Will call back around the first of the week to schedule

## 2012-12-31 ENCOUNTER — Encounter: Payer: Self-pay | Admitting: Gastroenterology

## 2013-09-25 DIAGNOSIS — J189 Pneumonia, unspecified organism: Secondary | ICD-10-CM

## 2013-09-25 DIAGNOSIS — I639 Cerebral infarction, unspecified: Secondary | ICD-10-CM

## 2013-09-25 HISTORY — DX: Pneumonia, unspecified organism: J18.9

## 2013-09-25 HISTORY — DX: Cerebral infarction, unspecified: I63.9

## 2014-01-23 ENCOUNTER — Inpatient Hospital Stay (HOSPITAL_COMMUNITY)
Admission: EM | Admit: 2014-01-23 | Discharge: 2014-01-26 | DRG: 066 | Disposition: A | Discharge status: Home / Self Care | Payer: Medicare Other | Attending: Internal Medicine | Admitting: Internal Medicine

## 2014-01-23 ENCOUNTER — Inpatient Hospital Stay (HOSPITAL_COMMUNITY): Payer: Medicare Other

## 2014-01-23 ENCOUNTER — Emergency Department (HOSPITAL_COMMUNITY): Payer: Medicare Other

## 2014-01-23 ENCOUNTER — Encounter (HOSPITAL_COMMUNITY): Payer: Self-pay | Admitting: Emergency Medicine

## 2014-01-23 DIAGNOSIS — E785 Hyperlipidemia, unspecified: Secondary | ICD-10-CM | POA: Diagnosis present

## 2014-01-23 DIAGNOSIS — F17200 Nicotine dependence, unspecified, uncomplicated: Secondary | ICD-10-CM | POA: Diagnosis present

## 2014-01-23 DIAGNOSIS — R209 Unspecified disturbances of skin sensation: Secondary | ICD-10-CM | POA: Diagnosis present

## 2014-01-23 DIAGNOSIS — H5509 Other forms of nystagmus: Secondary | ICD-10-CM | POA: Diagnosis present

## 2014-01-23 DIAGNOSIS — Z9849 Cataract extraction status, unspecified eye: Secondary | ICD-10-CM

## 2014-01-23 DIAGNOSIS — E119 Type 2 diabetes mellitus without complications: Secondary | ICD-10-CM | POA: Diagnosis present

## 2014-01-23 DIAGNOSIS — I6330 Cerebral infarction due to thrombosis of unspecified cerebral artery: Principal | ICD-10-CM | POA: Diagnosis present

## 2014-01-23 DIAGNOSIS — I639 Cerebral infarction, unspecified: Secondary | ICD-10-CM | POA: Diagnosis present

## 2014-01-23 DIAGNOSIS — I10 Essential (primary) hypertension: Secondary | ICD-10-CM | POA: Diagnosis present

## 2014-01-23 DIAGNOSIS — E1165 Type 2 diabetes mellitus with hyperglycemia: Secondary | ICD-10-CM

## 2014-01-23 DIAGNOSIS — IMO0001 Reserved for inherently not codable concepts without codable children: Secondary | ICD-10-CM | POA: Diagnosis present

## 2014-01-23 DIAGNOSIS — R29898 Other symptoms and signs involving the musculoskeletal system: Secondary | ICD-10-CM | POA: Diagnosis present

## 2014-01-23 DIAGNOSIS — Z79899 Other long term (current) drug therapy: Secondary | ICD-10-CM

## 2014-01-23 DIAGNOSIS — R269 Unspecified abnormalities of gait and mobility: Secondary | ICD-10-CM | POA: Diagnosis present

## 2014-01-23 DIAGNOSIS — Z7982 Long term (current) use of aspirin: Secondary | ICD-10-CM

## 2014-01-23 DIAGNOSIS — I6789 Other cerebrovascular disease: Secondary | ICD-10-CM | POA: Diagnosis present

## 2014-01-23 DIAGNOSIS — H532 Diplopia: Secondary | ICD-10-CM | POA: Diagnosis present

## 2014-01-23 DIAGNOSIS — R29810 Facial weakness: Secondary | ICD-10-CM | POA: Diagnosis present

## 2014-01-23 DIAGNOSIS — Z87891 Personal history of nicotine dependence: Secondary | ICD-10-CM | POA: Diagnosis present

## 2014-01-23 DIAGNOSIS — M25469 Effusion, unspecified knee: Secondary | ICD-10-CM | POA: Diagnosis not present

## 2014-01-23 DIAGNOSIS — I2510 Atherosclerotic heart disease of native coronary artery without angina pectoris: Secondary | ICD-10-CM | POA: Diagnosis present

## 2014-01-23 DIAGNOSIS — R42 Dizziness and giddiness: Secondary | ICD-10-CM | POA: Diagnosis present

## 2014-01-23 DIAGNOSIS — E1139 Type 2 diabetes mellitus with other diabetic ophthalmic complication: Secondary | ICD-10-CM

## 2014-01-23 LAB — COMPREHENSIVE METABOLIC PANEL
ALT: 14 U/L (ref 0–53)
AST: 16 U/L (ref 0–37)
Albumin: 3.4 g/dL — ABNORMAL LOW (ref 3.5–5.2)
Alkaline Phosphatase: 73 U/L (ref 39–117)
BUN: 17 mg/dL (ref 6–23)
CO2: 21 mEq/L (ref 19–32)
Calcium: 8.7 mg/dL (ref 8.4–10.5)
Chloride: 101 mEq/L (ref 96–112)
Creatinine, Ser: 1.01 mg/dL (ref 0.50–1.35)
GFR calc Af Amer: 85 mL/min — ABNORMAL LOW (ref >90)
GFR calc non Af Amer: 73 mL/min — ABNORMAL LOW (ref >90)
Glucose, Bld: 233 mg/dL — ABNORMAL HIGH (ref 70–99)
Potassium: 4.3 mEq/L (ref 3.7–5.3)
Sodium: 138 mEq/L (ref 137–147)
Total Bilirubin: 0.4 mg/dL (ref 0.3–1.2)
Total Protein: 6.7 g/dL (ref 6.0–8.3)

## 2014-01-23 LAB — URINE MICROSCOPIC-ADD ON

## 2014-01-23 LAB — RAPID URINE DRUG SCREEN, HOSP PERFORMED
Amphetamines: NOT DETECTED
Barbiturates: NOT DETECTED
Benzodiazepines: NOT DETECTED
Cocaine: NOT DETECTED
Opiates: NOT DETECTED
Tetrahydrocannabinol: NOT DETECTED

## 2014-01-23 LAB — URINALYSIS, ROUTINE W REFLEX MICROSCOPIC
Bilirubin Urine: NEGATIVE
Glucose, UA: >1000 mg/dL — AB
Ketones, ur: 15 mg/dL — AB
Leukocytes, UA: NEGATIVE
Nitrite: NEGATIVE
Protein, ur: >300 mg/dL — AB
Specific Gravity, Urine: 1.025 (ref 1.005–1.030)
Urobilinogen, UA: 0.2 mg/dL (ref 0.0–1.0)
pH: 6.5 (ref 5.0–8.0)

## 2014-01-23 LAB — CBC
HCT: 39.8 % (ref 39.0–52.0)
Hemoglobin: 13.5 g/dL (ref 13.0–17.0)
MCH: 30.8 pg (ref 26.0–34.0)
MCHC: 33.9 g/dL (ref 30.0–36.0)
MCV: 90.7 fL (ref 78.0–100.0)
Platelets: 227 10*3/uL (ref 150–400)
RBC: 4.39 MIL/uL (ref 4.22–5.81)
RDW: 13.8 % (ref 11.5–15.5)
WBC: 10.4 10*3/uL (ref 4.0–10.5)

## 2014-01-23 LAB — I-STAT TROPONIN, ED: Troponin i, poc: 0.03 ng/mL (ref 0.00–0.08)

## 2014-01-23 LAB — GLUCOSE, CAPILLARY: Glucose-Capillary: 135 mg/dL — ABNORMAL HIGH (ref 70–99)

## 2014-01-23 LAB — DIFFERENTIAL
Basophils Absolute: 0 10*3/uL (ref 0.0–0.1)
Basophils Relative: 0 % (ref 0–1)
Eosinophils Absolute: 0 10*3/uL (ref 0.0–0.7)
Eosinophils Relative: 0 % (ref 0–5)
Lymphocytes Relative: 10 % — ABNORMAL LOW (ref 12–46)
Lymphs Abs: 1 10*3/uL (ref 0.7–4.0)
Monocytes Absolute: 0.3 10*3/uL (ref 0.1–1.0)
Monocytes Relative: 3 % (ref 3–12)
Neutro Abs: 9 10*3/uL — ABNORMAL HIGH (ref 1.7–7.7)
Neutrophils Relative %: 87 % — ABNORMAL HIGH (ref 43–77)

## 2014-01-23 LAB — ETHANOL: Alcohol, Ethyl (B): <11 mg/dL (ref 0–11)

## 2014-01-23 LAB — PROTIME-INR
INR: 0.99 (ref 0.00–1.49)
Prothrombin Time: 12.9 seconds (ref 11.6–15.2)

## 2014-01-23 LAB — APTT: aPTT: 31 seconds (ref 24–37)

## 2014-01-23 LAB — CBG MONITORING, ED: Glucose-Capillary: 205 mg/dL — ABNORMAL HIGH (ref 70–99)

## 2014-01-23 MED ORDER — INSULIN ASPART 100 UNIT/ML ~~LOC~~ SOLN: 0.0000 [IU] | Freq: Every day | SUBCUTANEOUS | Status: DC

## 2014-01-23 MED ORDER — ASPIRIN 325 MG PO TABS
325.0000 mg | ORAL_TABLET | Freq: Every day | ORAL | Status: DC
  Administered 2014-01-24 – 2014-01-26 (×4): 325 mg via ORAL
  Filled 2014-01-23 (×4): qty 1

## 2014-01-23 MED ORDER — ENOXAPARIN SODIUM 40 MG/0.4ML ~~LOC~~ SOLN
40.0000 mg | SUBCUTANEOUS | Status: DC
  Administered 2014-01-24 – 2014-01-25 (×3): 40 mg via SUBCUTANEOUS
  Filled 2014-01-23 (×4): qty 0.4

## 2014-01-23 MED ORDER — SENNOSIDES-DOCUSATE SODIUM 8.6-50 MG PO TABS: 1.0000 | ORAL_TABLET | Freq: Every evening | ORAL | Status: DC | PRN

## 2014-01-23 MED ORDER — ACETAMINOPHEN 650 MG RE SUPP: 650.0000 mg | RECTAL | Status: DC | PRN

## 2014-01-23 MED ORDER — MECLIZINE HCL 25 MG PO TABS
25.0000 mg | ORAL_TABLET | Freq: Three times a day (TID) | ORAL | Status: DC | PRN
  Filled 2014-01-23: qty 1

## 2014-01-23 MED ORDER — ASPIRIN 300 MG RE SUPP
300.0000 mg | Freq: Every day | RECTAL | Status: DC
  Filled 2014-01-23 (×3): qty 1

## 2014-01-23 MED ORDER — ONDANSETRON HCL 4 MG/2ML IJ SOLN: 4.0000 mg | Freq: Four times a day (QID) | INTRAMUSCULAR | Status: DC | PRN

## 2014-01-23 MED ORDER — ACETAMINOPHEN 325 MG PO TABS: 650.0000 mg | ORAL_TABLET | ORAL | Status: DC | PRN

## 2014-01-23 MED ORDER — INSULIN ASPART 100 UNIT/ML ~~LOC~~ SOLN
0.0000 [IU] | Freq: Three times a day (TID) | SUBCUTANEOUS | Status: DC
  Administered 2014-01-24: 3 [IU] via SUBCUTANEOUS
  Administered 2014-01-24 – 2014-01-25 (×2): 8 [IU] via SUBCUTANEOUS
  Administered 2014-01-25: 5 [IU] via SUBCUTANEOUS
  Administered 2014-01-25: 3 [IU] via SUBCUTANEOUS
  Administered 2014-01-26 (×2): 5 [IU] via SUBCUTANEOUS

## 2014-01-23 MED ORDER — SIMVASTATIN 40 MG PO TABS
40.0000 mg | ORAL_TABLET | Freq: Every day | ORAL | Status: DC
  Administered 2014-01-24 – 2014-01-25 (×3): 40 mg via ORAL
  Filled 2014-01-23 (×4): qty 1

## 2014-01-23 MED ORDER — SODIUM CHLORIDE 0.9 % IV SOLN
INTRAVENOUS | Status: DC
  Administered 2014-01-23 – 2014-01-26 (×4): via INTRAVENOUS

## 2014-01-23 MED ORDER — CARVEDILOL 3.125 MG PO TABS
3.1250 mg | ORAL_TABLET | Freq: Two times a day (BID) | ORAL | Status: DC
  Administered 2014-01-24 – 2014-01-26 (×5): 3.125 mg via ORAL
  Filled 2014-01-23 (×7): qty 1

## 2014-01-23 MED ORDER — ONDANSETRON HCL 4 MG/2ML IJ SOLN
4.0000 mg | Freq: Once | INTRAMUSCULAR | Status: AC
  Administered 2014-01-23: 4 mg via INTRAVENOUS
  Filled 2014-01-23: qty 2

## 2014-01-23 NOTE — ED Notes (Signed)
Pt states he awoke from sleep around 4 am feeling as if the room was spinning.  He tried to use the bathroom and fell.  Pt c/o vomiting, a falling several times.  No loc.  Hypertensive and vomiting in triage 207/106.  Also c/o R facial numbness, bil blurred vision, since 4 am as well.  AO x 4.

## 2014-01-23 NOTE — ED Provider Notes (Signed)
CSN: [id number]     Arrival date & time 01/23/14  1357 History   First MD Initiated Contact with Patient 01/23/14 1514     Chief Complaint  Patient presents with  . Dizziness     (Consider location/radiation/quality/duration/timing/severity/associated sxs/prior Treatment) HPI  This is a 73 y.o. male with PMH of DM, HLD, HTN, presenting with dizziness, paresthesias, weakness to the face. Patient was last seen normal at 11:30 PM last night, at which time he went to bed. Patient awoke at 0400 on day of presentation, noticed profound dizziness, tingling of the right side of, weakness to the right side of his face. It has been persistent, has not worsened or improved. Described as "tingly." No meds taken besides home medications. Patient denies weakness, numbness, tingling of the remainder of his body. He denies dysarthria, aphasia. Negative for chest pain, shortness of breath, syncope. The patient has fallen 3 times due to this dizziness, and has hit his head on one of these occasions.  Past Medical History  Diagnosis Date  . Diabetes mellitus   . Hyperlipidemia   . Allergy   . Cataract   . Colon polyps 2012    Colonoscopy  . Diverticulosis 2012    Colonoscopy    Past Surgical History  Procedure Laterality Date  . Cataract extraction      both eyes  . Neck surgery    . Colon surgery      to repair intestines as child  . Knee arthroscopy      left  . Colonoscopy  10/20/2011    Procedure: COLONOSCOPY;  Surgeon: Inda Castle, MD;  Location: [facility];  Service: Endoscopy;  Laterality: N/A;  . Hot hemostasis  10/20/2011    Procedure: HOT HEMOSTASIS (ARGON PLASMA COAGULATION/BICAP);  Surgeon: Inda Castle, MD;  Location: [facility];  Service: Endoscopy;  Laterality: N/A;   Family History  Problem Relation Age of Onset  . Colon cancer Neg Hx   . Esophageal cancer Neg Hx   . Stomach cancer Neg Hx   . Anesthesia problems Neg Hx   . Hypotension Neg Hx   . Malignant  hyperthermia Neg Hx   . Pseudochol deficiency Neg Hx    History  Substance Use Topics  . Smoking status: Current Some Day Smoker    Types: Cigarettes  . Smokeless tobacco: Never Used     Comment: tobacco info given 11/07/2011  . Alcohol Use: Yes     Comment: rarely    Review of Systems  Constitutional: Negative for fever and chills.  HENT: Negative for facial swelling.   Eyes: Negative for pain and visual disturbance.  Respiratory: Negative for chest tightness and shortness of breath.   Cardiovascular: Negative for chest pain.  Gastrointestinal: Negative for nausea and vomiting.  Genitourinary: Negative for dysuria.  Musculoskeletal: Negative for arthralgias and myalgias.  Neurological: Positive for dizziness and facial asymmetry. Negative for headaches.  Psychiatric/Behavioral: Negative for behavioral problems.      Allergies  Review of patient's allergies indicates no known allergies.  Home Medications   Prior to Admission medications   Medication Sig Start Date End Date Taking? Authorizing Provider  aspirin 81 MG tablet Take 81 mg by mouth daily.   Yes Historical Provider, MD  carvedilol (COREG) 3.125 MG tablet Take 3.125 mg by mouth 2 (two) times daily with a meal.     Yes Historical Provider, MD  losartan (COZAAR) 100 MG tablet Take 100 mg by mouth. Take 1/2 tablet twice daily  Yes Historical Provider, MD  metFORMIN (GLUCOPHAGE) 500 MG tablet Take 500 mg by mouth 2 (two) times daily with a meal.     Yes Historical Provider, MD  simvastatin (ZOCOR) 40 MG tablet Take 40 mg by mouth at bedtime.     Yes Historical Provider, MD   BP 180/63  Pulse 70  Temp(Src) 97.6 F (36.4 C) (Oral)  Resp 17  Ht 5\' 10"  (1.778 m)  Wt 179 lb (81.194 kg)  BMI 25.68 kg/m2  SpO2 96% Physical Exam  Constitutional: He is oriented to person, place, and time. He appears well-developed and well-nourished. No distress.  HENT:  Head: Normocephalic and atraumatic.  Mouth/Throat: No  oropharyngeal exudate.  Eyes: Conjunctivae are normal. Pupils are equal, round, and reactive to light. No scleral icterus.  Neck: Normal range of motion. No tracheal deviation present. No thyromegaly present.  Cardiovascular: Normal rate, regular rhythm and normal heart sounds.  Exam reveals no gallop and no friction rub.   No murmur heard. Pulmonary/Chest: Effort normal and breath sounds normal. No stridor. No respiratory distress. He has no wheezes. He has no rales. He exhibits no tenderness.  Abdominal: Soft. He exhibits no distension and no mass. There is no tenderness. There is no rebound and no guarding.  Musculoskeletal: Normal range of motion. He exhibits no edema.  Neurological: He is alert and oriented to person, place, and time. He has normal strength. A sensory deficit (decreased sensation on the right side of his face) is present. GCS eye subscore is 4. GCS verbal subscore is 5. GCS motor subscore is 6.  Reflex Scores:      Achilles reflexes are 2+ on the right side and 0 on the left side. Positive for right-sided facial droop, paresthesias  The patient has an abnormal test of skew, vertical nystagmus  Skin: Skin is warm and dry. He is not diaphoretic.    ED Course  Procedures (including critical care time) Labs Review Labs Reviewed  DIFFERENTIAL - Abnormal; Notable for the following:    Neutrophils Relative % 87 (*)    Neutro Abs 9.0 (*)    Lymphocytes Relative 10 (*)    All other components within normal limits  COMPREHENSIVE METABOLIC PANEL - Abnormal; Notable for the following:    Glucose, Bld 233 (*)    Albumin 3.4 (*)    GFR calc non Af Amer 73 (*)    GFR calc Af Amer 85 (*)    All other components within normal limits  URINALYSIS, ROUTINE W REFLEX MICROSCOPIC - Abnormal; Notable for the following:    Glucose, UA >1000 (*)    Hgb urine dipstick SMALL (*)    Ketones, ur 15 (*)    Protein, ur >300 (*)    All other components within normal limits  GLUCOSE,  CAPILLARY - Abnormal; Notable for the following:    Glucose-Capillary 135 (*)    All other components within normal limits  CBG MONITORING, ED - Abnormal; Notable for the following:    Glucose-Capillary 205 (*)    All other components within normal limits  PROTIME-INR  APTT  CBC  URINE RAPID DRUG SCREEN (HOSP PERFORMED)  ETHANOL  URINE MICROSCOPIC-ADD ON  HEMOGLOBIN A1C  LIPID PANEL  I-STAT TROPOININ, ED    Imaging Review Dg Chest 2 View  01/23/2014   CLINICAL DATA:  Dizziness.  Fell.  Smoker.  EXAM: CHEST  2 VIEW  COMPARISON:  None.  FINDINGS: Normal sized heart. Elevated right hemidiaphragm. Mildly prominent interstitial markings with  diffuse peribronchial thickening. Thoracic spine degenerative changes. No fracture or pneumothorax seen.  IMPRESSION: 1. Chronic bronchitic changes. 2. Elevated right hemidiaphragm.   Electronically Signed   By: Enrique Sack M.D.   On: 01/23/2014 20:43   Ct Head Wo Contrast  01/23/2014   CLINICAL DATA:  Cough.  EXAM: CT HEAD WITHOUT CONTRAST  TECHNIQUE: Contiguous axial images were obtained from the base of the skull through the vertex without intravenous contrast.  COMPARISON:  MR C SPINE W/O CM dated 01/21/2007 .  FINDINGS: Small lucency is noted left body of the pons. 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 White matter changes are present most consistent with chronic ischemia. No evidence of mass effect. No evidence of hemorrhage. Optic globes are normal. Visualized paranasal sinuses are clear. No acute bony abnormality.  IMPRESSION: 1. Focal lucency in the left anterior aspect of the body of the pons. 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 Gadolinium-enhanced MRI of the brain however should be considered for further evaluation to exclude a focal enhancing lesion. 2. Chronic white matter ischemia.  No acute abnormality.   Electronically Signed   By: Marcello Moores  Register   On: 01/23/2014  17:04     MDM   Final diagnoses:  None    This is a 73 y.o. male with PMH of DM, HLD, HTN, presenting with dizziness, paresthesias, weakness to the face. Patient was last seen normal at 11:30 PM last night, at which time he went to bed. Patient awoke at 0400 on day of presentation, noticed profound dizziness, tingling of the right side of, weakness to the right side of his face. It has been persistent, has not worsened or improved. Described as "tingly." No meds taken besides home medications. Patient denies weakness, numbness, tingling of the remainder of his body. He denies dysarthria, aphasia. Negative for chest pain, shortness of breath, syncope. The patient has fallen 3 times due to this dizziness, and has hit his head on one of these occasions.  Examination as above, he should blood pressure has been decreasing since bed from [id number] systolic in triage. Heart and lung sounds within normal limits. Neurologic exam pertinent for right-sided facial droop, significant horizontal and vertical nystagmus, abnormal test of skew exam.   Date: 01/23/2014  Rate: 106  Rhythm: sinus tachycardia  QRS Axis: normal  Intervals: normal  ST/T Wave abnormalities: nonspecific ST changes  Conduction Disutrbances:left bundle branch block  Narrative Interpretation: Sinus tachycardia, left sided bundle branch block  Old EKG Reviewed: unchanged in comparison to EKG taken on 05/23/2000  Considering age, multiple risk factors, concerning story for stroke, will order CT scan to rule out intracranial hemorrhage. 5 consult neurology they agree with admission, further workup. Once CT scan is resulted, will call hospitalist service for admission. Obviously, with last normal seen being yesterday evening, we are well without thrombolysis window. The patient remains stable at this time.  Negative for hypoglycemia. Troponin is normal. INR is normal. CBC, CMP grossly within normal limits. He is hyperglycemic, without anion  gap.  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  We'll admit the patient for further stroke workup. He remained stable  Patient is being admitted in stable condition.  I have discussed case and care has been guided by my attending physician, Dr. Audie Pinto.  Doy Hutching, MD 01/24/14 0020

## 2014-01-23 NOTE — ED Notes (Signed)
Clarified with MD that code stroke was canceled.

## 2014-01-23 NOTE — ED Notes (Signed)
Pt states since the dizziness he has fell three times and one time hit right side of face, pt denies LOC.  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  Pt denies chest pain.  Pt appears pale, states he has vomited some phlegm today.  PT is alert and follows commands.  Pt feels better with eyes closed and describes room as being in a 45 degree angle

## 2014-01-23 NOTE — Consult Note (Signed)
Referring Physician: Audie Pinto    Chief Complaint: stroke  HPI:                                                                                                                                         [name] is an 73 y.o. male who went to sleep last night at 11:30 PM feeling normal.  He awoke at 0415 hours this AM and noted the room was spinning from left to right.  This vertiginous sensation was present with eye both open and closed and movement did not effect the vertigo.  In addition he noted diplopia when looking to the right and left.  When he finally tried to stand he noted he was falling to the left. He fell three times prior to arrival in ED. Pateint was brought to the ED by family. Currently he is still having vertigo, and nauseated.  He states he takes a ASA daily.  On arrival to ED his BP was 223/80   Date last known well: Date: 01/22/2014 Time last known well: Time: 23:30 tPA Given: No: out of window  Past Medical History  Diagnosis Date  . Diabetes mellitus   . Hyperlipidemia   . Allergy   . Cataract   . Colon polyps 2012    Colonoscopy  . Diverticulosis 2012    Colonoscopy     Past Surgical History  Procedure Laterality Date  . Cataract extraction      both eyes  . Neck surgery    . Colon surgery      to repair intestines as child  . Knee arthroscopy      left  . Colonoscopy  10/20/2011    Procedure: COLONOSCOPY;  Surgeon: Inda Castle, MD;  Location: [facility];  Service: Endoscopy;  Laterality: N/A;  . Hot hemostasis  10/20/2011    Procedure: HOT HEMOSTASIS (ARGON PLASMA COAGULATION/BICAP);  Surgeon: Inda Castle, MD;  Location: [facility];  Service: Endoscopy;  Laterality: N/A;    Family History  Problem Relation Age of Onset  . Colon cancer Neg Hx   . Esophageal cancer Neg Hx   . Stomach cancer Neg Hx   . Anesthesia problems Neg Hx   . Hypotension Neg Hx   . Malignant hyperthermia Neg Hx   . Pseudochol deficiency Neg Hx    Social History:   reports that he has been smoking Cigarettes.  He has been smoking about 0.00 packs per day. He has never used smokeless tobacco. He reports that he drinks alcohol. He reports that he does not use illicit drugs.  Allergies: No Known Allergies  Medications:  No current facility-administered medications for this encounter.   Current Outpatient Prescriptions  Medication Sig Dispense Refill  . aspirin 81 MG tablet Take 81 mg by mouth daily.      . carvedilol (COREG) 3.125 MG tablet Take 3.125 mg by mouth 2 (two) times daily with a meal.        . losartan (COZAAR) 100 MG tablet Take 100 mg by mouth. Take 1/2 tablet twice daily       . metFORMIN (GLUCOPHAGE) 500 MG tablet Take 500 mg by mouth 2 (two) times daily with a meal.        . simvastatin (ZOCOR) 40 MG tablet Take 40 mg by mouth at bedtime.           ROS:                                                                                                                                       History obtained from the patient  General ROS: negative for - chills, fatigue, fever, night sweats, weight gain or weight loss Psychological ROS: negative for - behavioral disorder, hallucinations, memory difficulties, mood swings or suicidal ideation Ophthalmic ROS: negative for - blurry vision, double vision, eye pain or loss of vision ENT ROS: negative for - epistaxis, nasal discharge, oral lesions, sore throat, tinnitus or vertigo Allergy and Immunology ROS: negative for - hives or itchy/watery eyes Hematological and Lymphatic ROS: negative for - bleeding problems, bruising or swollen lymph nodes Endocrine ROS: negative for - galactorrhea, hair pattern changes, polydipsia/polyuria or temperature intolerance Respiratory ROS: negative for - cough, hemoptysis, shortness of breath or wheezing Cardiovascular ROS: negative for - chest  pain, dyspnea on exertion, edema or irregular heartbeat Gastrointestinal ROS: negative for - abdominal pain, diarrhea, hematemesis, nausea/vomiting or stool incontinence Genito-Urinary ROS: negative for - dysuria, hematuria, incontinence or urinary frequency/urgency Musculoskeletal ROS: negative for - joint swelling or muscular weakness Neurological ROS: as noted in HPI Dermatological ROS: negative for rash and skin lesion changes  Neurologic Examination:                                                                                                      Blood pressure 180/63, pulse 70, temperature 97.6 F (36.4 C), temperature source Oral, resp. rate 17, height 5\' 10"  (1.778 m), weight 81.194 kg (179 lb), SpO2 96.00%.   Mental Status: Alert, oriented, thought content appropriate.  Speech fluent without evidence of aphasia.  Able to follow 3 step commands without  difficulty. Cranial Nerves: II: Discs flat bilaterally; Visual fields grossly normal, pupils equal, round, reactive to light and accommodation III,IV, VI: ptosis not present, extra-ocular motions intact bilaterally, with disconjugate movements (right eye is more inferiorly displaced), vertical nystagmus more pronounced when looking up and lateral nystagmus with fast phase to the left (more pronounced when looking to the left) V,VII: smile symmetric but at rest right NLF decreased, facial light touch sensation normal bilaterally VIII: hearing normal bilaterally IX,X: gag reflex present XI: bilateral shoulder shrug XII: midline tongue extension without atrophy or fasciculations  Motor: Right : Upper extremity   5/5    Left:     Upper extremity   5/5  Lower extremity   5/5     Lower extremity   5/5 Tone and bulk:normal tone throughout; no atrophy noted Sensory: Pinprick and light touch intact throughout, bilaterally Deep Tendon Reflexes:  Right: Upper Extremity   Left: Upper extremity   biceps (C-5 to C-6) 2/4   biceps (C-5 to  C-6) 2/4 tricep (C7) 2/4    triceps (C7) 2/4 Brachioradialis (C6) 2/4  Brachioradialis (C6) 2/4  Lower Extremity Lower Extremity  quadriceps (L-2 to L-4) 0/4   quadriceps (L-2 to L-4) 0/4 Achilles (S1) 0/4   Achilles (S1) 0/4  Plantars: Right: downgoing   Left: downgoing Cerebellar: normal finger-to-nose,  normal heel-to-shin test Gait: not tested due to concern of falling CV: pulses palpable throughout    Lab Results: Basic Metabolic Panel:  Recent Labs Lab 01/23/14 1411  NA 138  K 4.3  CL 101  CO2 21  GLUCOSE 233*  BUN 17  CREATININE 1.01  CALCIUM 8.7    Liver Function Tests:  Recent Labs Lab 01/23/14 1411  AST 16  ALT 14  ALKPHOS 73  BILITOT 0.4  PROT 6.7  ALBUMIN 3.4*   No results found for this basename: LIPASE, AMYLASE,  in the last 168 hours No results found for this basename: AMMONIA,  in the last 168 hours  CBC:  Recent Labs Lab 01/23/14 1411  WBC 10.4  NEUTROABS 9.0*  HGB 13.5  HCT 39.8  MCV 90.7  PLT 227    Cardiac Enzymes: No results found for this basename: CKTOTAL, CKMB, CKMBINDEX, TROPONINI,  in the last 168 hours  Lipid Panel: No results found for this basename: CHOL, TRIG, HDL, CHOLHDL, VLDL, LDLCALC,  in the last 168 hours  CBG:  Recent Labs Lab 01/23/14 [place]*    Microbiology: No results found for this or any previous visit.  Coagulation Studies:  Recent Labs  01/23/14 1411  LABPROT 12.9  INR 0.99    Imaging: No results found.     Assessment and plan discussed with with attending physician and they are in agreement.    Etta Quill PA-C [facility] [phone]  01/23/2014, 3:40 PM   Assessment: 73 y.o. male presenting with new onset vertigo, N/V, and gait instability.  Exam is positive for vertical and horizontal nystagmus, disconjugate gaze and resting decrease in the right NLF. 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    ED has ordered CT brain (pending)  Stroke Risk Factors - diabetes mellitus, hyperlipidemia and hypertension    Recommend: 1. HgbA1c, fasting lipid panel 2. MRI, MRA  of the brain without contrast, MRA neck with and without contrast 3. PT consult, OT consult, Speech consult 4. Echocardiogram 5. Prophylactic therapy-Antiplatelet med: Aspirin -  dose 325 6. Risk  Factor modification   I personally participated in this patient's evaluation and management, including formulating the above clinical assessment and management recommendations.   Rush Farmer M.D. [facility] [phone]

## 2014-01-23 NOTE — H&P (Signed)
[facility] History and Physical  [name] [id number] DOB: [birth year] DOA: 01/23/2014  Referring physician: EDP PCP: Peter Ringer, MD   Chief Complaint: Right facial droop and numbness  HPI: [name] is a 73 y.o. male past medical history significant for diabetes mellitus, hypertension, hyperlipidemia who presents with above complaints. He states that he woke up at about 4 AM today with right facial weakness and some right upper extremity weakness as well. He states that the room felt like it was spinning and he was unable to get his balance. As a result he states he fell x3 today. He reports nausea or vomiting associated with the vertigo. Upon arrival in the ED CT scan of his head was done and showed focal lucency in the left anterior aspect of the body of the pons most likely an old infarct-no acute findings. Neurology was consulted and admission to [facility] is requested.    Review of Systems The patient denies anorexia, fever, weight loss,, vision loss, decreased hearing, hoarseness, chest pain, syncope, dyspnea on exertion, peripheral edema, hemoptysis, abdominal pain, melena, hematochezia, severe indigestion/heartburn, hematuria, incontinence, genital sores, muscle weakness bleeding, enlarged lymph nodes, angioedema, and breast masses.   Past Medical History  Diagnosis Date  . Diabetes mellitus   . Hyperlipidemia   . Allergy   . Cataract   . Colon polyps 2012    Colonoscopy  . Diverticulosis 2012    Colonoscopy    Past Surgical History  Procedure Laterality Date  . Cataract extraction      both eyes  . Neck surgery    . Colon surgery      to repair intestines as child  . Knee arthroscopy      left  . Colonoscopy  10/20/2011    Procedure: COLONOSCOPY;  Surgeon: Inda Castle, MD;  Location: [facility] ENDOSCOPY;  Service: Endoscopy;  Laterality: N/A;  . Hot hemostasis  10/20/2011    Procedure: HOT HEMOSTASIS (ARGON PLASMA COAGULATION/BICAP);  Surgeon:  Inda Castle, MD;  Location: [facility];  Service: Endoscopy;  Laterality: N/A;   Social History:  reports that he has been smoking Cigarettes.  He has been smoking about 0.00 packs per day. He has never used smokeless tobacco. He reports that he drinks alcohol. He reports that he does not use illicit drugs.  No Known Allergies  Family History  Problem Relation Age of Onset  . Colon cancer Neg Hx   . Esophageal cancer Neg Hx   . Stomach cancer Neg Hx   . Anesthesia problems Neg Hx   . Hypotension Neg Hx   . Malignant hyperthermia Neg Hx   . Pseudochol deficiency Neg Hx      Prior to Admission medications   Medication Sig Start Date End Date Taking? Authorizing Provider  aspirin 81 MG tablet Take 81 mg by mouth daily.   Yes Historical Provider, MD  carvedilol (COREG) 3.125 MG tablet Take 3.125 mg by mouth 2 (two) times daily with a meal.     Yes Historical Provider, MD  losartan (COZAAR) 100 MG tablet Take 100 mg by mouth. Take 1/2 tablet twice daily    Yes Historical Provider, MD  metFORMIN (GLUCOPHAGE) 500 MG tablet Take 500 mg by mouth 2 (two) times daily with a meal.     Yes Historical Provider, MD  simvastatin (ZOCOR) 40 MG tablet Take 40 mg by mouth at bedtime.     Yes Historical Provider, MD   Physical Exam: Filed Vitals:   01/23/14 1806  BP: 160/68  Pulse: 70  Temp:   Resp: 20    BP 160/68  Pulse 70  Temp(Src) 97.6 F (36.4 C) (Oral)  Resp 20  Ht 5\' 10"  (1.778 m)  Wt 81.194 kg (179 lb)  BMI 25.68 kg/m2  SpO2 96% Constitutional: Vital signs reviewed.  Patient is a well-developed and well-nourished  in no acute distress and cooperative with exam. Alert and oriented x3.  Head: Normocephalic and atraumatic, mildly diminished right nasolabial fold Ear: TM normal bilaterally Nose: No erythema or drainage noted.  Turbinates normal Mouth: no erythema or exudates, MMM Eyes: PERRL, EOMI, conjunctivae normal, No scleral icterus. Vertical nystagmus present  Neck:  Supple, Trachea midline normal ROM, No JVD, mass, thyromegaly, or carotid bruit present.  Cardiovascular: RRR, S1 normal, S2 normal, no MRG, pulses symmetric and intact bilaterally Pulmonary/Chest: normal respiratory effort, CTAB, no wheezes, rales, or rhonchi Abdominal: Soft. Non-tender, non-distended, bowel sounds are normal, no masses, organomegaly, or guarding present.  GU: no CVA tenderness  extremities: No cyanosis and no edema  Neurological: A&O x3, mildly diminished right nasolabial fold, otherwise Strength is normal and symmetric bilaterally, cranial nerve II-XII are grossly intact, no focal motor deficit, sensory intact to light touch bilaterally.  Skin: Warm, dry and intact. No rash, cyanosis, or clubbing.  Psychiatric: Normal mood and affect. speech and behavior is normal. Judgment and thought content normal. Cognition and memory are normal.                Labs on Admission:  Basic Metabolic Panel:  Recent Labs Lab 01/23/14 1411  NA 138  K 4.3  CL 101  CO2 21  GLUCOSE 233*  BUN 17  CREATININE 1.01  CALCIUM 8.7   Liver Function Tests:  Recent Labs Lab 01/23/14 1411  AST 16  ALT 14  ALKPHOS 73  BILITOT 0.4  PROT 6.7  ALBUMIN 3.4*   No results found for this basename: LIPASE, AMYLASE,  in the last 168 hours No results found for this basename: AMMONIA,  in the last 168 hours CBC:  Recent Labs Lab 01/23/14 1411  WBC 10.4  NEUTROABS 9.0*  HGB 13.5  HCT 39.8  MCV 90.7  PLT 227   Cardiac Enzymes: No results found for this basename: CKTOTAL, CKMB, CKMBINDEX, TROPONINI,  in the last 168 hours  BNP (last 3 results) No results found for this basename: PROBNP,  in the last 8760 hours CBG:  Recent Labs Lab 01/23/14 1415  GLUCAP 205*    Radiological Exams on Admission: Ct Head Wo Contrast  01/23/2014   CLINICAL DATA:  Cough.  EXAM: CT HEAD WITHOUT CONTRAST  TECHNIQUE: Contiguous axial images were obtained from the base of the skull through the  vertex without intravenous contrast.  COMPARISON:  MR C SPINE W/O CM dated 01/21/2007 .  FINDINGS: Small lucency is noted left body of the pons. 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 White matter changes are present most consistent with chronic ischemia. No evidence of mass effect. No evidence of hemorrhage. Optic globes are normal. Visualized paranasal sinuses are clear. No acute bony abnormality.  IMPRESSION: 1. Focal lucency in the left anterior aspect of the body of the pons. 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 Gadolinium-enhanced MRI of the brain however should be considered for further evaluation to exclude a focal enhancing lesion. 2. Chronic white matter ischemia.  No acute abnormality.   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 -Place on aspirin 325 mg as per neuro recommendations(note patient was on 81 mg aspirin prior to admission) -Appreciate neurology input, follow for further recommendations -PT OT ST consults  Diabetes   -Hold off metformin for now monitor Accu-Cheks with sliding scale insulin   HYPERLIPIDEMIA -Continue Zocor   TOBACCO ABUSE -Counseled to quit tobacco   HYPERTENSION -Continue carvedilol, hold off on Cozaar to allow for permissive hypertension in the first couple of days -Monitor and further treat accordingly   CAD, NATIVE VESSEL -He is chest pain-free, continue carvedilol        Code Status: Full Family Communication: None at Bedside Disposition Plan: Admit to neuro telemetry bed  Time spent:>62min  Sheila Oats [facility] Pager [phone]

## 2014-01-23 NOTE — Progress Notes (Signed)
Patient just got to the floor from ER. Patient is alert and oriented x4

## 2014-01-24 DIAGNOSIS — I6330 Cerebral infarction due to thrombosis of unspecified cerebral artery: Principal | ICD-10-CM

## 2014-01-24 DIAGNOSIS — E785 Hyperlipidemia, unspecified: Secondary | ICD-10-CM

## 2014-01-24 DIAGNOSIS — I517 Cardiomegaly: Secondary | ICD-10-CM

## 2014-01-24 LAB — GLUCOSE, CAPILLARY
Glucose-Capillary: 118 mg/dL — ABNORMAL HIGH (ref 70–99)
Glucose-Capillary: 298 mg/dL — ABNORMAL HIGH (ref 70–99)
Glucose-Capillary: 172 mg/dL — ABNORMAL HIGH (ref 70–99)
Glucose-Capillary: 196 mg/dL — ABNORMAL HIGH (ref 70–99)

## 2014-01-24 LAB — LIPID PANEL
Cholesterol: 124 mg/dL (ref 0–200)
HDL: 39 mg/dL — ABNORMAL LOW (ref >39)
LDL Cholesterol: 65 mg/dL (ref 0–99)
Total CHOL/HDL Ratio: 3.2 RATIO
Triglycerides: 101 mg/dL (ref <150)
VLDL: 20 mg/dL (ref 0–40)

## 2014-01-24 LAB — HEMOGLOBIN A1C
Hgb A1c MFr Bld: 9 % — ABNORMAL HIGH (ref <5.7)
Mean Plasma Glucose: 212 mg/dL — ABNORMAL HIGH (ref <117)

## 2014-01-24 MED ORDER — METFORMIN HCL 500 MG PO TABS
500.0000 mg | ORAL_TABLET | Freq: Two times a day (BID) | ORAL | Status: DC
  Administered 2014-01-24 – 2014-01-26 (×4): 500 mg via ORAL
  Filled 2014-01-24 (×6): qty 1

## 2014-01-24 MED ORDER — STROKE: EARLY STAGES OF RECOVERY BOOK
Freq: Once | Status: AC
  Administered 2014-01-24: 01:00:00
  Filled 2014-01-24: qty 1

## 2014-01-24 NOTE — Progress Notes (Signed)
Stroke Team Progress Note  HISTORY [name] is a 73 y.o. male who went to sleep 01/22/2014 at 11:30 PM feeling normal. He awoke at 0415 hours 01/23/2014 and noted the room was spinning from left to right. This vertiginous sensation was present with eye both open and closed and movement did not effect the vertigo. In addition he noted diplopia when looking to the right and left. When he finally tried to stand he noted he was falling to the left. He fell three times prior to arrival in ED. Pateint was brought to the ED by family. When seen by Dr. Nicole Kindred he was still having vertigo and nausea. He stated that he takes ASA daily.  On arrival to ED his BP was 223/80    Date last known well: Date: 01/22/2014  Time last known well: Time: 23:30  tPA Given: No: out of window   SUBJECTIVE Two family members were at the bedside. The patient states his vision is somewhat improved; although he still has occasional dizziness the room does not appear to be spinning, and the right side of his face is numb.  OBJECTIVE Most recent Vital Signs: Filed Vitals:   01/24/14 0200 01/24/14 0410 01/24/14 0639 01/24/14 1028  BP: 167/66 173/73 162/66 175/75  Pulse: 68 78 73 78  Temp: 97.6 F (36.4 C) 97.6 F (36.4 C) 97.8 F (36.6 C) 97.5 F (36.4 C)  TempSrc: Oral Oral Oral Oral  Resp: 18 18 18 18   Height:      Weight:      SpO2: 98% 98% 98% 96%   CBG (last 3)   Recent Labs  01/23/14 2259 01/24/14 0700 01/24/14 1128  GLUCAP 135* 118* 298*    IV Fluid Intake:   . sodium chloride 100 mL/hr at 01/23/14 2022    MEDICATIONS  . aspirin  300 mg Rectal Daily   Or  . aspirin  325 mg Oral Daily  . carvedilol  3.125 mg Oral BID WC  . enoxaparin (LOVENOX) injection  40 mg Subcutaneous Q24H  . insulin aspart  0-15 Units Subcutaneous TID WC  . insulin aspart  0-5 Units Subcutaneous QHS  . simvastatin  40 mg Oral QHS   PRN:  acetaminophen, acetaminophen, meclizine, ondansetron (ZOFRAN) IV,  senna-docusate  Diet:  Cardiac thin liquids Activity: Up with assistance DVT Prophylaxis:  Lovenox  CLINICALLY SIGNIFICANT STUDIES Basic Metabolic Panel:  Recent Labs Lab 01/23/14 1411  NA 138  K 4.3  CL 101  CO2 21  GLUCOSE 233*  BUN 17  CREATININE 1.01  CALCIUM 8.7   Liver Function Tests:  Recent Labs Lab 01/23/14 1411  AST 16  ALT 14  ALKPHOS 73  BILITOT 0.4  PROT 6.7  ALBUMIN 3.4*   CBC:  Recent Labs Lab 01/23/14 1411  WBC 10.4  NEUTROABS 9.0*  HGB 13.5  HCT 39.8  MCV 90.7  PLT 227   Coagulation:  Recent Labs Lab 01/23/14 1411  LABPROT 12.9  INR 0.99   Cardiac Enzymes: No results found for this basename: CKTOTAL, CKMB, CKMBINDEX, TROPONINI,  in the last 168 hours Urinalysis:  Recent Labs Lab 01/23/14 1600  COLORURINE YELLOW  LABSPEC 1.025  PHURINE 6.5  GLUCOSEU >1000*  HGBUR SMALL*  BILIRUBINUR NEGATIVE  KETONESUR 15*  PROTEINUR >300*  UROBILINOGEN 0.2  NITRITE NEGATIVE  LEUKOCYTESUR NEGATIVE   Lipid Panel    Component Value Date/Time   CHOL 124 01/24/2014 0406   TRIG 101 01/24/2014 0406   HDL 39* 01/24/2014 0406  CHOLHDL 3.2 01/24/2014 0406   VLDL 20 01/24/2014 0406   LDLCALC 65 01/24/2014 0406   HgbA1C  No results found for this basename: HGBA1C    Urine Drug Screen:     Component Value Date/Time   LABOPIA NONE DETECTED 01/23/2014 1601   COCAINSCRNUR NONE DETECTED 01/23/2014 1601   LABBENZ NONE DETECTED 01/23/2014 1601   AMPHETMU NONE DETECTED 01/23/2014 1601   THCU NONE DETECTED 01/23/2014 1601   LABBARB NONE DETECTED 01/23/2014 1601    Alcohol Level:  Recent Labs Lab 01/23/14 1424  ETH <11    Dg Chest 2 View 01/23/2014    1. Chronic bronchitic changes. 2. Elevated right hemidiaphragm.   Ct Head Wo Contrast 01/23/2014    1. Focal lucency in the left anterior aspect of the body of the pons. 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 Gadolinium-enhanced MRI of the brain however should be considered for further evaluation to exclude a focal  enhancing lesion. 2. Chronic white matter ischemia.  No acute abnormality.   [name] 01/24/2014    MRI HEAD   1. 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 No intracranial hemorrhage.  2. 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  3. Subcentimeter remote infarcts within the left pons and right periventricular white matter.    MRA HEAD 1. No proximal branch occlusion or high-grade flow-limiting stenosis identified within the intracranial circulation.  2. Mild multi focal atherosclerotic irregularity within the cavernous segments of the internal carotid arteries bilaterally left greater than right. There is associated short segment stenosis of approximately 50% within the left cavernous carotid artery.      2D Echocardiogram  pending  Carotid Doppler  pending   EKG sinus tachycardia rate 106 beats per minute . Left bundle-branch block. For complete results please see formal report.   Therapy Recommendations  pending  Physical Exam    Mental Status:  Alert, oriented, thought content appropriate. Speech fluent without evidence of aphasia. Able to follow 3 step commands without difficulty.  Cranial Nerves:  II: Discs flat bilaterally; Visual fields grossly normal, pupils equal, round, reactive to light and accommodation  III,IV, VI: ptosis not present, extra-ocular motions intact bilaterally, with disconjugate movements (right eye is more inferiorly displaced), vertical nystagmus more pronounced when looking up and lateral nystagmus with fast phase to the left (more pronounced when looking to the left)  V,VII: smile symmetric but at rest right NLF decreased. Decreased sensation right side of face. VIII: hearing normal bilaterally  IX,X: gag reflex present  XI: bilateral shoulder shrug  XII: midline tongue extension without atrophy or fasciculations  Motor:  Right : Upper extremity 5/5 Left:  Upper extremity 5/5  Lower extremity 5/5 Lower extremity 5/5  Tone and bulk:normal tone throughout; no atrophy noted  Sensory: Pinprick and light touch intact throughout, bilaterally  Deep Tendon Reflexes:  Right: Upper Extremity Left: Upper extremity  biceps (C-5 to C-6) 2/4 biceps (C-5 to C-6) 2/4  tricep (C7) 2/4 triceps (C7) 2/4  Brachioradialis (C6) 2/4 Brachioradialis (C6) 2/4  Lower Extremity Lower Extremity  quadriceps (L-2 to L-4) 0/4 quadriceps (L-2 to L-4) 0/4  Achilles (S1) 0/4 Achilles (S1) 0/4  Plantars:  Right: downgoing Left: downgoing  Cerebellar:  normal finger-to-nose, normal heel-to-shin test  Gait: not tested due to concern of falling   ASSESSMENT Mr. Latasha Holgate is a 73 y.o. 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 The infarct was felt to be thrombotic secondary to small vessel disease. On aspirin 81 mg orally every day prior to admission. Now on aspirin 325 mg orally every day for secondary stroke prevention. Patient with resultant diplopia, dizziness, and right facial numbness.. Stroke work up underway.   Diabetes mellitus - hemoglobin A1c pending  Hypertension  Hyperlipidemia - on Zocor prior to admission - cholesterol 124;  LDL 65  Ongoing tobacco use   Remote infarcts within the left pons and right periventricular white matter.    Hospital day # 1  TREATMENT/PLAN  Continue aspirin 325 mg orally every day for secondary stroke prevention.  Risk factor modification - smoking cessation was discussed with the patient.  Await therapy evaluations  Await 2-D echo, hemoglobin A1c, and carotid Dopplers   Mikey Bussing PA-C [facility] Pager [phone] 01/24/2014, 12:04 PM  I have personally obtained a history, examined the patient, evaluated imaging results, and formulated  the assessment and plan of care. I agree with the above.   Jim Like, DO Neurology-Stroke  To contact Stroke Continuity provider, please refer to [email or web address] After hours, contact General Neurology

## 2014-01-24 NOTE — Evaluation (Addendum)
Physical Therapy Evaluation Patient Details Name: [name] MRN: [id number] DOB: 1941-04-07 Today's Date: 01/24/2014   History of Present Illness  73 y.o. 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  Clinical Impression  Pt adm due to the above. Presents with limitations in functional mobility secondary to deficits indicated below. Pt to benefit from skilled acute PT to address deficits and maximize independence with mobility prior to D/C home. Will plan to address use of AD next session to assess need for further DME upon D/C.     Follow Up Recommendations Outpatient PT;Supervision/Assistance - 24 hour;Other (comment) (for neurorehab )    Equipment Recommendations  Other (comment) (TBD)    Recommendations for Other Services OT consult     Precautions / Restrictions Precautions Precautions: Fall Precaution Comments: c/o numbness on rt side of face  Restrictions Weight Bearing Restrictions: No      Mobility  Bed Mobility Overal bed mobility: Modified Independent             General bed mobility comments: HOB elevated and relying on handrails   Transfers Overall transfer level: Needs assistance Equipment used: None Transfers: Sit to/from Stand Sit to Stand: Min guard         General transfer comment: initial sit to stand pt with LOB to Rt and posteriorly and unable to achieve; cues to slow down and for technique; pt able to achieve second attempt to stand with min guard (A)   Ambulation/Gait Ambulation/Gait assistance: Min assist Ambulation Distance (Feet): 80 Feet Assistive device: 1 person hand held assist Gait Pattern/deviations: Step-through pattern;Narrow base of support;Staggering right;Staggering left;Ataxic Gait velocity: decreased Gait velocity interpretation: Below normal speed for  age/gender General Gait Details: pt staggering with head turns and when he becomes distracted; may benefit from cane to stabilize and reduce risk of falls; cues for safety and technique throughout; decreased safety awareness with gt   Stairs            Wheelchair Mobility    Modified Rankin (Stroke Patients Only) Modified Rankin (Stroke Patients Only) Pre-Morbid Rankin Score: No symptoms Modified Rankin: Moderately severe disability     Balance Overall balance assessment: Needs assistance Sitting-balance support: Feet supported;No upper extremity supported Sitting balance-Leahy Scale: Good   Postural control: Posterior lean Standing balance support: During functional activity;No upper extremity supported Standing balance-Leahy Scale: Fair Standing balance comment: initially unsteady standind at EOB; decreased ability to accept weightshift                              Pertinent Vitals/Pain C/o Rt sided face numbness.     Home Living Family/patient expects to be discharged to:: Private residence Living Arrangements: Spouse/significant other Available Help at Discharge: Family;Available 24 hours/day Type of Home: House Home Access: Stairs to enter Entrance Stairs-Rails: Can reach both;Left;Right Entrance Stairs-Number of Steps: 7 Home Layout: Two level;Able to live on main level with bedroom/bathroom Home Equipment: None      Prior Function Level of Independence: Independent         Comments: pt driving and independent with all ADLs      Hand Dominance        Extremity/Trunk Assessment   Upper Extremity Assessment: Defer to OT evaluation           Lower Extremity Assessment: Overall WFL for  tasks assessed      Cervical / Trunk Assessment: Kyphotic  Communication   Communication: No difficulties  Cognition Arousal/Alertness: Awake/alert Behavior During Therapy: Impulsive Overall Cognitive Status: Impaired/Different from  baseline Area of Impairment: Safety/judgement;Problem solving         Safety/Judgement: Decreased awareness of deficits   Problem Solving: Difficulty sequencing;Requires verbal cues;Requires tactile cues General Comments: pt attempting to get OOB when instructed not to yet; pt unawre of balance deficits and stated he can walk independently     General Comments General comments (skin integrity, edema, etc.): began stroke education     Exercises        Assessment/Plan    PT Assessment Patient needs continued PT services  PT Diagnosis Abnormality of gait   PT Problem List Decreased balance;Decreased activity tolerance;Decreased mobility;Decreased cognition;Decreased knowledge of use of DME;Decreased safety awareness;Impaired sensation  PT Treatment Interventions DME instruction;Gait training;Stair training;Functional mobility training;Therapeutic exercise;Balance training;Therapeutic activities;Neuromuscular re-education;Patient/family education   PT Goals (Current goals can be found in the Care Plan section) Acute Rehab PT Goals Patient Stated Goal: to go home PT Goal Formulation: With patient Time For Goal Achievement: 01/31/14 Potential to Achieve Goals: Good    Frequency Min 4X/week   Barriers to discharge Decreased caregiver support wife works during the day    Co-evaluation               End of Session Equipment Utilized During Treatment: Gait belt Activity Tolerance: Patient limited by fatigue Patient left: in bed;with call bell/phone within reach;with bed alarm set;with family/visitor present Nurse Communication: Mobility status;Precautions         Time: [id number] PT Time Calculation (min): 17 min   Charges:   PT Evaluation $Initial PT Evaluation Tier I: 1 Procedure PT Treatments $Gait Training: 8-22 mins   PT G CodesGustavus Bryant, Bliss 01/24/2014, 3:08 PM

## 2014-01-24 NOTE — Progress Notes (Signed)
Echo Lab  2D Echocardiogram completed.  Porter, RDCS 01/24/2014 10:31 AM

## 2014-01-24 NOTE — Progress Notes (Signed)
[facility] PROGRESS NOTE  [name] [id number] DOB: 02-13-1941 DOA: 01/23/2014 PCP: Tivis Ringer, MD  Assessment/Plan:  Right middle cerebellar peduncle CVA -As discussed above CT of head only with focal lucency in anterior aspect of pons-likely old infarct  -MRI with 7 mm acute ischemic infarct involving the right middle cerebellar peduncle, await carotid Dopplers, 2-D echocardiogram, hemoglobin A1c is 9.0, fasting lipid profile and follow.  -Continue aspirin 325 mg as per neuro recommendations(note patient was on 81 mg aspirin prior to admission)  -Appreciate neurology input, follow for further recommendations  -PT OT ST consults  Diabetes  -Poorly controlled diabetes, will follow and discuss the recommendation for insulin given his elevated A1c with patient -Resume at home and for now [place] to quit tobacco  HYPERTENSION  -Continue carvedilol, hold off on Cozaar to allow for permissive hypertension in the first couple of days  -Monitor and further treat accordingly  CAD, NATIVE VESSEL  -He remainschest pain-free, continue carvedilol   Code Status: Full Family Communication: Family/niece at bedside Disposition Plan: To home when medically ready   Consultants:  Neurology  Procedures:  Echocardiogram-results pending  Antibiotics:  None  HPI/Subjective: States no further vertigo, reports still problem with his balance  Objective: Filed Vitals:   01/24/14 1028  BP: 175/75  Pulse: 78  Temp: 97.5 F (36.4 C)  Resp: 18   No intake or output data in the 24 hours ending 01/24/14 1607 Filed Weights   01/23/14 1405 01/23/14 1943  Weight: 81.194 kg (179 lb) 84.188 kg (185 lb 9.6 oz)    Exam:  General: alert & oriented x 3 In NAD Cardiovascular: RRR, nl S1 s2 Respiratory: CTAB Abdomen: soft +BS NT/ND, no masses palpable Extremities: No cyanosis and no edema    Data Reviewed: Basic  Metabolic Panel:  Recent Labs Lab 01/23/14 1411  NA 138  K 4.3  CL 101  CO2 21  GLUCOSE 233*  BUN 17  CREATININE 1.01  CALCIUM 8.7   Liver Function Tests:  Recent Labs Lab 01/23/14 1411  AST 16  ALT 14  ALKPHOS 73  BILITOT 0.4  PROT 6.7  ALBUMIN 3.4*   No results found for this basename: LIPASE, AMYLASE,  in the last 168 hours No results found for this basename: AMMONIA,  in the last 168 hours CBC:  Recent Labs Lab 01/23/14 1411  WBC 10.4  NEUTROABS 9.0*  HGB 13.5  HCT 39.8  MCV 90.7  PLT 227   Cardiac Enzymes: No results found for this basename: CKTOTAL, CKMB, CKMBINDEX, TROPONINI,  in the last 168 hours BNP (last 3 results) No results found for this basename: PROBNP,  in the last 8760 hours CBG:  Recent Labs Lab 01/23/14 1415 01/23/14 2259 01/24/14 0700 01/24/14 1128  GLUCAP 205* 135* 118* 298*    No results found for this or any previous visit (from the past 240 hour(s)).   Studies: Dg Chest 2 View  01/23/2014   CLINICAL DATA:  Dizziness.  Fell.  Smoker.  EXAM: CHEST  2 VIEW  COMPARISON:  None.  FINDINGS: Normal sized heart. Elevated right hemidiaphragm. Mildly prominent interstitial markings with diffuse peribronchial thickening. Thoracic spine degenerative changes. No fracture or pneumothorax seen.  IMPRESSION: 1. Chronic bronchitic changes. 2. Elevated right hemidiaphragm.   Electronically Signed   By: Enrique Sack M.D.   On: 01/23/2014 20:43   Ct Head Wo Contrast  01/23/2014   CLINICAL DATA:  Cough.  EXAM: CT HEAD  WITHOUT CONTRAST  TECHNIQUE: Contiguous axial images were obtained from the base of the skull through the vertex without intravenous contrast.  COMPARISON:  MR C SPINE W/O CM dated 01/21/2007 .  FINDINGS: Small lucency is noted left body of the pons. 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 White matter changes are present most consistent with chronic ischemia. No  evidence of mass effect. No evidence of hemorrhage. Optic globes are normal. Visualized paranasal sinuses are clear. No acute bony abnormality.  IMPRESSION: 1. Focal lucency in the left anterior aspect of the body of the pons. 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 Gadolinium-enhanced MRI of the brain however should be considered for further evaluation to exclude a focal enhancing lesion. 2. Chronic white matter ischemia.  No acute abnormality.   Electronically Signed   By: Marcello Moores  Register   On: 01/23/2014 17:04   [name]  01/24/2014   CLINICAL DATA:  Right facial weakness with some right upper extremity weakness. Nausea vomiting, vertigo and gait instability. Evaluate for stroke.  EXAM: MRI HEAD WITHOUT CONTRAST  MRA HEAD WITHOUT CONTRAST  TECHNIQUE: Multiplanar, multiecho pulse sequences of the brain and surrounding structures were obtained without intravenous contrast. Angiographic images of the head were obtained using MRA technique without contrast.  COMPARISON:  Prior CT performed earlier on the same day.  FINDINGS: MRI HEAD FINDINGS  Mild diffuse prominence of the CSF containing spaces is compatible with generalized cerebral atrophy. Scattered and confluent T2/FLAIR hyperintensity within the periventricular and deep white matter are most compatible with chronic small vessel ischemic changes, mild for patient age. A remote subcentimeter a lacunar infarct noted within the periventricular white matter of the right corona radiata (series 8, image 15). Small remote infarct noted within the left pons as well.  No mass lesion, midline shift, or extra-axial fluid collection. Ventricles are normal in size without evidence of hydrocephalus.  There is a 7 mm focus of restricted diffusion within the right middle cerebellar peduncle, suggestive of acute ischemic infarct (series 4, image 8). No associated hemorrhage or significant mass effect. There is a small amount of associated T2/FLAIR hyperintensity.  Gray-white matter differentiation is maintained.  The cervicomedullary junction is normal. Pituitary gland is within normal limits. Pituitary stalk is midline. The globes and optic nerves demonstrate a normal appearance with normal signal intensity.  The bone marrow signal intensity is normal. Calvarium is intact. Visualized upper cervical spine is within normal limits.  Scalp soft tissues are unremarkable.  Paranasal sinuses are clear.  No mastoid effusion.  MRA HEAD FINDINGS  Anterior circulation: Normal flow related enhancement of the included cervical, petrous, cavernous and supra clinoid internal carotid arteries. Mild multi focal atherosclerotic irregularity seen within the cavernous segments of the internal carotid arteries bilaterally without high-grade flow-limiting stenosis, left greater than right. There is approximately 50% stenosis within the cavernous left ICA (series 5, image 71). Patent anterior communicating artery. Normal flow related enhancement of the anterior and middle cerebral arteries, including more distal segments.  Posterior circulation: Vertebral arteries are codominant. Basilar artery is patent, with normal flow related enhancement of the main branch vessels. Normal flow related enhancement of the posterior cerebral arteries.  No large vessel occlusion, hemodynamically significant stenosis, abnormal luminal irregularity, aneurysm within the anterior nor posterior circulation.  IMPRESSION: MRI HEAD IMPRESSION:  1. 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 No intracranial hemorrhage. 2.  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 3. Subcentimeter remote infarcts within the left pons and right periventricular white matter.  MRA HEAD IMPRESSION:  1. No proximal branch occlusion or high-grade flow-limiting stenosis identified within the intracranial circulation. 2. Mild multi focal atherosclerotic irregularity  within the cavernous segments of the internal carotid arteries bilaterally left greater than right. There is associated short segment stenosis of approximately 50% within the left cavernous carotid artery.   Electronically Signed   By: Jeannine Boga M.D.   On: 01/24/2014 05:33   Mr Jodene Nam Head/brain Wo Cm  01/24/2014   CLINICAL DATA:  Right facial weakness with some right upper extremity weakness. Nausea vomiting, vertigo and gait instability. Evaluate for stroke.  EXAM: MRI HEAD WITHOUT CONTRAST  MRA HEAD WITHOUT CONTRAST  TECHNIQUE: Multiplanar, multiecho pulse sequences of the brain and surrounding structures were obtained without intravenous contrast. Angiographic images of the head were obtained using MRA technique without contrast.  COMPARISON:  Prior CT performed earlier on the same day.  FINDINGS: MRI HEAD FINDINGS  Mild diffuse prominence of the CSF containing spaces is compatible with generalized cerebral atrophy. Scattered and confluent T2/FLAIR hyperintensity within the periventricular and deep white matter are most compatible with chronic small vessel ischemic changes, mild for patient age. A remote subcentimeter a lacunar infarct noted within the periventricular white matter of the right corona radiata (series 8, image 15). Small remote infarct noted within the left pons as well.  No mass lesion, midline shift, or extra-axial fluid collection. Ventricles are normal in size without evidence of hydrocephalus.  There is a 7 mm focus of restricted diffusion within the right middle cerebellar peduncle, suggestive of acute ischemic infarct (series 4, image 8). No associated hemorrhage or significant mass effect. There is a small amount of associated T2/FLAIR hyperintensity. Gray-white matter differentiation is maintained.  The cervicomedullary junction is normal. Pituitary gland is within normal limits. Pituitary stalk is midline. The globes and optic nerves demonstrate a normal appearance with normal  signal intensity.  The bone marrow signal intensity is normal. Calvarium is intact. Visualized upper cervical spine is within normal limits.  Scalp soft tissues are unremarkable.  Paranasal sinuses are clear.  No mastoid effusion.  MRA HEAD FINDINGS  Anterior circulation: Normal flow related enhancement of the included cervical, petrous, cavernous and supra clinoid internal carotid arteries. Mild multi focal atherosclerotic irregularity seen within the cavernous segments of the internal carotid arteries bilaterally without high-grade flow-limiting stenosis, left greater than right. There is approximately 50% stenosis within the cavernous left ICA (series 5, image 71). Patent anterior communicating artery. Normal flow related enhancement of the anterior and middle cerebral arteries, including more distal segments.  Posterior circulation: Vertebral arteries are codominant. Basilar artery is patent, with normal flow related enhancement of the main branch vessels. Normal flow related enhancement of the posterior cerebral arteries.  No large vessel occlusion, hemodynamically significant stenosis, abnormal luminal irregularity, aneurysm within the anterior nor posterior circulation.  IMPRESSION: MRI HEAD IMPRESSION:  1. 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 No intracranial hemorrhage. 2. 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 3. Subcentimeter remote infarcts within the left pons and right periventricular white matter.  MRA HEAD IMPRESSION:  1. No proximal branch occlusion or high-grade flow-limiting stenosis identified within the intracranial circulation. 2. Mild multi focal atherosclerotic irregularity within the cavernous segments of the internal carotid arteries bilaterally left greater than right. There is associated short segment stenosis of approximately  50% within the left cavernous carotid artery.   Electronically Signed   By:  Jeannine Boga M.D.   On: 01/24/2014 05:33    Scheduled Meds: . aspirin  300 mg Rectal Daily   Or  . aspirin  325 mg Oral Daily  . carvedilol  3.125 mg Oral BID WC  . enoxaparin (LOVENOX) injection  40 mg Subcutaneous Q24H  . insulin aspart  0-15 Units Subcutaneous TID WC  . insulin aspart  0-5 Units Subcutaneous QHS  . simvastatin  40 mg Oral QHS   Continuous Infusions: . sodium chloride 100 mL/hr at 01/24/14 1213    Active Problems:   HYPERLIPIDEMIA   TOBACCO ABUSE   HYPERTENSION   CAD, NATIVE VESSEL   Thrombotic stroke   Dizziness   Diabetes   Facial droop    Time spent: [facility] Pager [phone]. If 7PM-7AM, please contact night-coverage at [email or web address], password [facility] 01/24/2014, 4:07 PM  LOS: 1 day               81

## 2014-01-25 ENCOUNTER — Inpatient Hospital Stay (HOSPITAL_COMMUNITY): Payer: Medicare Other

## 2014-01-25 DIAGNOSIS — I6350 Cerebral infarction due to unspecified occlusion or stenosis of unspecified cerebral artery: Secondary | ICD-10-CM

## 2014-01-25 LAB — GLUCOSE, CAPILLARY
Glucose-Capillary: 232 mg/dL — ABNORMAL HIGH (ref 70–99)
Glucose-Capillary: 255 mg/dL — ABNORMAL HIGH (ref 70–99)
Glucose-Capillary: 167 mg/dL — ABNORMAL HIGH (ref 70–99)
Glucose-Capillary: 146 mg/dL — ABNORMAL HIGH (ref 70–99)

## 2014-01-25 LAB — BASIC METABOLIC PANEL
BUN: 14 mg/dL (ref 6–23)
CO2: 23 mEq/L (ref 19–32)
Calcium: 8.2 mg/dL — ABNORMAL LOW (ref 8.4–10.5)
Chloride: 109 mEq/L (ref 96–112)
Creatinine, Ser: 0.96 mg/dL (ref 0.50–1.35)
GFR calc Af Amer: >90 mL/min (ref >90)
GFR calc non Af Amer: 82 mL/min — ABNORMAL LOW (ref >90)
Glucose, Bld: 223 mg/dL — ABNORMAL HIGH (ref 70–99)
Potassium: 3.9 mEq/L (ref 3.7–5.3)
Sodium: 142 mEq/L (ref 137–147)

## 2014-01-25 MED ORDER — INSULIN GLARGINE 100 UNIT/ML ~~LOC~~ SOLN
8.0000 [IU] | Freq: Every day | SUBCUTANEOUS | Status: DC
  Administered 2014-01-25: 8 [IU] via SUBCUTANEOUS
  Filled 2014-01-25 (×2): qty 0.08

## 2014-01-25 MED ORDER — LOSARTAN POTASSIUM 50 MG PO TABS
100.0000 mg | ORAL_TABLET | Freq: Every day | ORAL | Status: DC
  Administered 2014-01-25 – 2014-01-26 (×2): 100 mg via ORAL
  Filled 2014-01-25 (×2): qty 2

## 2014-01-25 NOTE — Progress Notes (Signed)
Physical Therapy Treatment Patient Details Name: [name] MRN: [id number] DOB: 07/27/1941 Today's Date: 01/25/2014    History of Present Illness 73 y.o. 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    PT Comments    Session focused on increasing mobilization and assessing high level balance deficits. Pt scored 10 on DGI, indicating he is at high risk for falls. Pt educated throughout session on appropriate technique to ambulate with quad cane. Pt would benefit from ambulating with RW, however, he is adamantly against the use of RW. Pt and wife educated on balance deficits pt is experiencing and encouraged pt to have 24/7 (A) upon acute D/C. Will cont to follow per POC.   Follow Up Recommendations  Outpatient PT;Supervision/Assistance - 24 hour;Other (comment)     Equipment Recommendations  Other (comment) (quad cane )    Recommendations for Other Services OT consult     Precautions / Restrictions Precautions Precautions: Fall Precaution Comments: c/o numbness on rt side of face  Restrictions Weight Bearing Restrictions: No    Mobility  Bed Mobility Overal bed mobility: Modified Independent             General bed mobility comments: effortful; no physical (A) needed   Transfers Overall transfer level: Needs assistance Equipment used: None Transfers: Sit to/from Stand Sit to Stand: Supervision         General transfer comment: supervision for safety; pt bracing LEs on bedside; cues for safety   Ambulation/Gait Ambulation/Gait assistance: Min assist Ambulation Distance (Feet): 180 Feet Assistive device: 1 person hand held assist;Quad cane Gait Pattern/deviations: Narrow base of support;Staggering right;Scissoring;Decreased stride length Gait velocity: decreased; unable to safely incr at this time Gait  velocity interpretation: Below normal speed for age/gender General Gait Details: pt continues to stagger Rt; pt primarily with balance disturbances when challenged with high level gt; see DGI; pt also easily distractable in hallway and demo balance disturbances    Stairs Stairs: Yes Stairs assistance: Min guard Stair Management: Two rails;Step to pattern;Forwards Number of Stairs: 4 General stair comments: cues for technique and safety; min guard to stabilize   Wheelchair Mobility    Modified Rankin (Stroke Patients Only) Modified Rankin (Stroke Patients Only) Pre-Morbid Rankin Score: No symptoms Modified Rankin: Moderately severe disability     Balance Overall balance assessment: Needs assistance Sitting-balance support: Feet supported;No upper extremity supported Sitting balance-Leahy Scale: Good     Standing balance support: During functional activity;No upper extremity supported Standing balance-Leahy Scale: Fair Standing balance comment: performed static standing balance activiites; pt unable to maintain balance >5 seconds with narrow BOS; with standard BOS and eyese closed tolerated ~30 seconds; all activities with supervision; pt reaching out of BOS with bil UEs x10 each to incorporate functional balance challenges at bedside                High Level Balance Comments: see DGI    Cognition Arousal/Alertness: Awake/alert Behavior During Therapy: Impulsive Overall Cognitive Status: Impaired/Different from baseline Area of Impairment: Safety/judgement;Problem solving         Safety/Judgement: Decreased awareness of deficits   Problem Solving: Difficulty sequencing;Requires verbal cues;Requires tactile cues General Comments: pt continues to be impulsive; may be at baseline for safety awareness     Exercises      General Comments General comments (skin integrity, edema, etc.): discussed stroke education and risk factors; wife  concerned about pt smoking        Pertinent Vitals/Pain C/o numbness on rt side of face; no pain.     Home Living                      Prior Function            PT Goals (current goals can now be found in the care plan section) Acute Rehab PT Goals Patient Stated Goal: to go home PT Goal Formulation: With patient Time For Goal Achievement: 01/31/14 Potential to Achieve Goals: Good Progress towards PT goals: Progressing toward goals    Frequency  Min 4X/week    PT Plan Current plan remains appropriate    Co-evaluation             End of Session Equipment Utilized During Treatment: Gait belt Activity Tolerance: Patient tolerated treatment well Patient left: in bed;with call bell/phone within reach;with bed alarm set;with family/visitor present     Time: 1352-1416 PT Time Calculation (min): 24 min  Charges:  $Gait Training: 8-22 mins $Neuromuscular Re-education: 8-22 mins                    G CodesKennis Carina [place], [place]  [id number] 01/25/2014, 2:24 PM

## 2014-01-25 NOTE — Progress Notes (Addendum)
Stroke Team Progress Note  HISTORY [name] is a 73 y.o. male who went to sleep 01/22/2014 at 11:30 PM feeling normal. He awoke at 0415 hours 01/23/2014 and noted the room was spinning from left to right. This vertiginous sensation was present with eye both open and closed and movement did not effect the vertigo. In addition he noted diplopia when looking to the right and left. When he finally tried to stand he noted he was falling to the left. He fell three times prior to arrival in ED. Pateint was brought to the ED by family. When seen by Dr. Nicole Kindred he was still having vertigo and nausea. He stated that he takes ASA daily.  On arrival to ED his BP was 223/80    Date last known well: Date: 01/22/2014  Time last known well: Time: 23:30  tPA Given: No: out of window   SUBJECTIVE The patient's wife is at the bedside. Overall he is unchanged except his vision is improving. He states that the diplopia comes and goes. Anxious for discharge. His left knee is bothering him. He believes he injured it when he fell. It appears swollen. May need x-ray.  OBJECTIVE Most recent Vital Signs: Filed Vitals:   01/24/14 2211 01/25/14 0248 01/25/14 0618 01/25/14 0845  BP: 173/72 170/80 173/48 156/62  Pulse: 70 78 71 75  Temp: 98 F (36.7 C) 98.2 F (36.8 C) 98.1 F (36.7 C) 97.7 F (36.5 C)  TempSrc: Oral Oral Oral Oral  Resp: 16 18 18 18   Height:      Weight:      SpO2: 96% 99% 99% 97%   CBG (last 3)   Recent Labs  01/24/14 1610 01/24/14 2212 01/25/14 0703  GLUCAP 172* 196* 232*    IV Fluid Intake:   . sodium chloride 100 mL/hr at 01/25/14 0128    MEDICATIONS  . aspirin  300 mg Rectal Daily   Or  . aspirin  325 mg Oral Daily  . carvedilol  3.125 mg Oral BID WC  . enoxaparin (LOVENOX) injection  40 mg Subcutaneous Q24H  . insulin aspart  0-15 Units Subcutaneous TID WC  . insulin aspart  0-5 Units Subcutaneous QHS  . metFORMIN  500 mg Oral BID WC  . simvastatin  40 mg Oral QHS    PRN:  acetaminophen, acetaminophen, meclizine, ondansetron (ZOFRAN) IV, senna-docusate  Diet:  Cardiac thin liquids Activity: Up with assistance DVT Prophylaxis:  Lovenox  CLINICALLY SIGNIFICANT STUDIES Basic Metabolic Panel:   Recent Labs Lab 01/23/14 1411 01/25/14 0702  NA 138 142  K 4.3 3.9  CL 101 109  CO2 21 23  GLUCOSE 233* 223*  BUN 17 14  CREATININE 1.01 0.96  CALCIUM 8.7 8.2*   Liver Function Tests:   Recent Labs Lab 01/23/14 1411  AST 16  ALT 14  ALKPHOS 73  BILITOT 0.4  PROT 6.7  ALBUMIN 3.4*   CBC:   Recent Labs Lab 01/23/14 1411  WBC 10.4  NEUTROABS 9.0*  HGB 13.5  HCT 39.8  MCV 90.7  PLT 227   Coagulation:   Recent Labs Lab 01/23/14 1411  LABPROT 12.9  INR 0.99   Cardiac Enzymes: No results found for this basename: CKTOTAL, CKMB, CKMBINDEX, TROPONINI,  in the last 168 hours Urinalysis:   Recent Labs Lab 01/23/14 1600  COLORURINE YELLOW  LABSPEC 1.025  PHURINE 6.5  GLUCOSEU >1000*  HGBUR SMALL*  BILIRUBINUR NEGATIVE  KETONESUR 15*  PROTEINUR >300*  UROBILINOGEN 0.2  NITRITE NEGATIVE  LEUKOCYTESUR NEGATIVE   Lipid Panel    Component Value Date/Time   CHOL 124 01/24/2014 0406   TRIG 101 01/24/2014 0406   HDL 39* 01/24/2014 0406   CHOLHDL 3.2 01/24/2014 0406   VLDL 20 01/24/2014 0406   LDLCALC 65 01/24/2014 0406   HgbA1C  Lab Results  Component Value Date   HGBA1C 9.0* 01/24/2014    Urine Drug Screen:     Component Value Date/Time   LABOPIA NONE DETECTED 01/23/2014 1601   COCAINSCRNUR NONE DETECTED 01/23/2014 1601   LABBENZ NONE DETECTED 01/23/2014 1601   AMPHETMU NONE DETECTED 01/23/2014 1601   THCU NONE DETECTED 01/23/2014 1601   LABBARB NONE DETECTED 01/23/2014 1601    Alcohol Level:   Recent Labs Lab 01/23/14 1424  ETH <11    Dg Chest 2 View 01/23/2014    1. Chronic bronchitic changes. 2. Elevated right hemidiaphragm.   Ct Head Wo Contrast 01/23/2014    1. Focal lucency in the left anterior aspect of the body of the  pons. 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 Gadolinium-enhanced MRI of the brain however should be considered for further evaluation to exclude a focal enhancing lesion. 2. Chronic white matter ischemia.  No acute abnormality.   [name] 01/24/2014    MRI HEAD   1. 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 No intracranial hemorrhage.  2. 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  3. Subcentimeter remote infarcts within the left pons and right periventricular white matter.    MRA HEAD 1. No proximal branch occlusion or high-grade flow-limiting stenosis identified within the intracranial circulation.  2. Mild multi focal atherosclerotic irregularity within the cavernous segments of the internal carotid arteries bilaterally left greater than right. There is associated short segment stenosis of approximately 50% within the left cavernous carotid artery.      2D Echocardiogram  ejection fraction 40-45%. No cardiac source of emboli identified.  Carotid Doppler  pending   EKG sinus tachycardia rate 106 beats per minute . Left bundle-branch block. For complete results please see formal report.   Therapy Recommendations  outpatient physical therapy with 24-hour per day supervision recommended.  Physical Exam    Mental Status:  Alert, oriented, thought content appropriate. Speech fluent without evidence of aphasia. Able to follow 3 step commands without difficulty.  Cranial Nerves:  II: Discs flat bilaterally; Visual fields grossly normal, pupils equal, round, reactive to light and accommodation  III,IV, VI: ptosis not present, extra-ocular motions intact bilaterally, with disconjugate movements (right eye is more inferiorly displaced), vertical nystagmus more pronounced when looking up and lateral nystagmus with fast phase to the left (more pronounced when looking to the left)  V,VII: smile symmetric but  at rest right NLF decreased. Decreased sensation right side of face. VIII: hearing normal bilaterally  IX,X: gag reflex present  XI: bilateral shoulder shrug  XII: midline tongue extension without atrophy or fasciculations  Motor:  Right : Upper extremity 5/5 Left: Upper extremity 5/5  Lower extremity 5/5 Lower extremity 5/5  Tone and bulk:normal tone throughout; no atrophy noted  Sensory: Pinprick and light touch intact throughout, bilaterally  Deep Tendon Reflexes:  Right: Upper Extremity Left: Upper extremity  biceps (C-5 to C-6) 2/4 biceps (C-5 to C-6) 2/4  tricep (C7) 2/4 triceps (C7) 2/4  Brachioradialis (C6) 2/4 Brachioradialis (C6) 2/4  Lower Extremity Lower Extremity  quadriceps (L-2 to L-4) 0/4 quadriceps (L-2 to L-4) 0/4  Achilles (  S1) 0/4 Achilles (S1) 0/4  Plantars:  Right: downgoing Left: downgoing  Cerebellar:  normal finger-to-nose, normal heel-to-shin test  Gait: not tested due to concern of falling   ASSESSMENT Mr. [name] is a 73 y.o. 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 The infarct was felt to be thrombotic secondary to small vessel disease. On aspirin 81 mg orally every day prior to admission. Now on aspirin 325 mg orally every day for secondary stroke prevention. Patient with resultant diplopia, dizziness, and right facial numbness.. Stroke work up underway.   Diabetes mellitus - hemoglobin A1c 9.0  Hypertension  Hyperlipidemia - on Zocor prior to admission - cholesterol 124;  LDL 65  Ongoing tobacco use   Remote infarcts within the left pons and right periventricular white matter.    Hospital day # 2  TREATMENT/PLAN  Continue aspirin 325 mg orally every day for secondary stroke prevention.  Risk factor modification - smoking cessation was discussed with the patient.  Outpatient  physical therapy with 24-hour per day provision recommended.  Await carotid Dopplers   Followup Dr. Leonie Man in 2 months.   Mikey Bussing PA-C [facility] Pager [phone] 01/25/2014, 9:14 AM  I have personally obtained a history, examined the patient, evaluated imaging results, and formulated the assessment and plan of care. I agree with the above.   Jim Like, DO Neurology-Stroke  To contact Stroke Continuity provider, please refer to [email or web address] After hours, contact General Neurology

## 2014-01-25 NOTE — Progress Notes (Signed)
[facility] PROGRESS NOTE  [name] [id number] DOB: 1941/08/10 DOA: 01/23/2014 PCP: Tivis Ringer, MD  Assessment/Plan:  Right middle cerebellar peduncle CVA -As discussed above CT of head only with focal lucency in anterior aspect of pons-likely old infarct  -MRI with 7 mm acute ischemic infarct involving the right middle cerebellar peduncle, await carotid Dopplers, 2-D echocardiogram, hemoglobin A1c is 9.0, fasting lipid profile and follow.  -Continue aspirin 325 mg as per neuro recommendations(note patient was on 81 mg aspirin prior to admission) -Resume Cozaar for better blood pressure control  -Appreciate neurology input, follow for further recommendations  -PT -recommending outpatient PT Diabetes  -Poorly controlled diabetes, discussed patient/family will add at bedtime Lantus and follow -Continue metformin HYPERLIPIDEMIA  -Continue Zocor  TOBACCO ABUSE  -Counseled to quit tobacco  HYPERTENSION  -Continue carvedilol, resume Cozaar and follow -Monitor and further treat accordingly  CAD, NATIVE VESSEL  -He remains chest pain-free, continue carvedilol   Code Status: Full Family Communication: Wife at bedside Disposition Plan: To home when medically ready   Consultants:  Neurology  Procedures:  Echocardiogram-results pending  Antibiotics:  None  HPI/Subjective: Denies any new complaints, wife at bedside  Objective: Filed Vitals:   01/25/14 0845  BP: 156/62  Pulse: 75  Temp: 97.7 F (36.5 C)  Resp: 18    Intake/Output Summary (Last 24 hours) at 01/25/14 1052 Last data filed at 01/25/14 0400  Gross per 24 hour  Intake    120 ml  Output    750 ml  Net   -630 ml   Filed Weights   01/23/14 1405 01/23/14 1943  Weight: 81.194 kg (179 lb) 84.188 kg (185 lb 9.6 oz)    Exam:  General: alert & oriented x 3 In NAD Cardiovascular: RRR, nl S1 s2 Respiratory: CTAB Abdomen: soft +BS NT/ND, no masses palpable Extremities: No cyanosis and no  edema    Data Reviewed: Basic Metabolic Panel:  Recent Labs Lab 01/23/14 1411 01/25/14 0702  NA 138 142  K 4.3 3.9  CL 101 109  CO2 21 23  GLUCOSE 233* 223*  BUN 17 14  CREATININE 1.01 0.96  CALCIUM 8.7 8.2*   Liver Function Tests:  Recent Labs Lab 01/23/14 1411  AST 16  ALT 14  ALKPHOS 73  BILITOT 0.4  PROT 6.7  ALBUMIN 3.4*   No results found for this basename: LIPASE, AMYLASE,  in the last 168 hours No results found for this basename: AMMONIA,  in the last 168 hours CBC:  Recent Labs Lab 01/23/14 1411  WBC 10.4  NEUTROABS 9.0*  HGB 13.5  HCT 39.8  MCV 90.7  PLT 227   Cardiac Enzymes: No results found for this basename: CKTOTAL, CKMB, CKMBINDEX, TROPONINI,  in the last 168 hours BNP (last 3 results) No results found for this basename: PROBNP,  in the last 8760 hours CBG:  Recent Labs Lab 01/24/14 0700 01/24/14 1128 01/24/14 1610 01/24/14 2212 01/25/14 0703  GLUCAP 118* 298* 172* 196* 232*    No results found for this or any previous visit (from the past 240 hour(s)).   Studies: Dg Chest 2 View  01/23/2014   CLINICAL DATA:  Dizziness.  Fell.  Smoker.  EXAM: CHEST  2 VIEW  COMPARISON:  None.  FINDINGS: Normal sized heart. Elevated right hemidiaphragm. Mildly prominent interstitial markings with diffuse peribronchial thickening. Thoracic spine degenerative changes. No fracture or pneumothorax seen.  IMPRESSION: 1. Chronic bronchitic changes. 2. Elevated right hemidiaphragm.   Electronically Signed   By: Richardson Landry  Joneen Caraway M.D.   On: 01/23/2014 20:43   Ct Head Wo Contrast  01/23/2014   CLINICAL DATA:  Cough.  EXAM: CT HEAD WITHOUT CONTRAST  TECHNIQUE: Contiguous axial images were obtained from the base of the skull through the vertex without intravenous contrast.  COMPARISON:  MR C SPINE W/O CM dated 01/21/2007 .  FINDINGS: Small lucency is noted left body of the pons. Although this is most likely an old infarct, gadolinium-enhanced MRI of the brain is  suggested for further evaluation. White matter changes are present most consistent with chronic ischemia. No evidence of mass effect. No evidence of hemorrhage. Optic globes are normal. Visualized paranasal sinuses are clear. No acute bony abnormality.  IMPRESSION: 1. Focal lucency in the left anterior aspect of the body of the pons. 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 Gadolinium-enhanced MRI of the brain however should be considered for further evaluation to exclude a focal enhancing lesion. 2. Chronic white matter ischemia.  No acute abnormality.   Electronically Signed   By: Marcello Moores  Register   On: 01/23/2014 17:04   [name]  01/24/2014   CLINICAL DATA:  Right facial weakness with some right upper extremity weakness. Nausea vomiting, vertigo and gait instability. Evaluate for stroke.  EXAM: MRI HEAD WITHOUT CONTRAST  MRA HEAD WITHOUT CONTRAST  TECHNIQUE: Multiplanar, multiecho pulse sequences of the brain and surrounding structures were obtained without intravenous contrast. Angiographic images of the head were obtained using MRA technique without contrast.  COMPARISON:  Prior CT performed earlier on the same day.  FINDINGS: MRI HEAD FINDINGS  Mild diffuse prominence of the CSF containing spaces is compatible with generalized cerebral atrophy. Scattered and confluent T2/FLAIR hyperintensity within the periventricular and deep white matter are most compatible with chronic small vessel ischemic changes, mild for patient age. A remote subcentimeter a lacunar infarct noted within the periventricular white matter of the right corona radiata (series 8, image 15). Small remote infarct noted within the left pons as well.  No mass lesion, midline shift, or extra-axial fluid collection. Ventricles are normal in size without evidence of hydrocephalus.  There is a 7 mm focus of restricted diffusion within the right middle cerebellar peduncle, suggestive of acute ischemic infarct (series 4, image 8). No  associated hemorrhage or significant mass effect. There is a small amount of associated T2/FLAIR hyperintensity. Gray-white matter differentiation is maintained.  The cervicomedullary junction is normal. Pituitary gland is within normal limits. Pituitary stalk is midline. The globes and optic nerves demonstrate a normal appearance with normal signal intensity.  The bone marrow signal intensity is normal. Calvarium is intact. Visualized upper cervical spine is within normal limits.  Scalp soft tissues are unremarkable.  Paranasal sinuses are clear.  No mastoid effusion.  MRA HEAD FINDINGS  Anterior circulation: Normal flow related enhancement of the included cervical, petrous, cavernous and supra clinoid internal carotid arteries. Mild multi focal atherosclerotic irregularity seen within the cavernous segments of the internal carotid arteries bilaterally without high-grade flow-limiting stenosis, left greater than right. There is approximately 50% stenosis within the cavernous left ICA (series 5, image 71). Patent anterior communicating artery. Normal flow related enhancement of the anterior and middle cerebral arteries, including more distal segments.  Posterior circulation: Vertebral arteries are codominant. Basilar artery is patent, with normal flow related enhancement of the main branch vessels. Normal flow related enhancement of the posterior cerebral arteries.  No large vessel occlusion, hemodynamically significant stenosis, abnormal luminal irregularity, aneurysm within the anterior nor posterior circulation.  IMPRESSION: MRI HEAD IMPRESSION:  1. 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 No intracranial hemorrhage. 2. 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 3. Subcentimeter remote infarcts within the left pons and right periventricular white matter.  MRA HEAD IMPRESSION:  1. No proximal branch occlusion or high-grade  flow-limiting stenosis identified within the intracranial circulation. 2. Mild multi focal atherosclerotic irregularity within the cavernous segments of the internal carotid arteries bilaterally left greater than right. There is associated short segment stenosis of approximately 50% within the left cavernous carotid artery.   Electronically Signed   By: Jeannine Boga M.D.   On: 01/24/2014 05:33   Mr Jodene Nam Head/brain Wo Cm  01/24/2014   CLINICAL DATA:  Right facial weakness with some right upper extremity weakness. Nausea vomiting, vertigo and gait instability. Evaluate for stroke.  EXAM: MRI HEAD WITHOUT CONTRAST  MRA HEAD WITHOUT CONTRAST  TECHNIQUE: Multiplanar, multiecho pulse sequences of the brain and surrounding structures were obtained without intravenous contrast. Angiographic images of the head were obtained using MRA technique without contrast.  COMPARISON:  Prior CT performed earlier on the same day.  FINDINGS: MRI HEAD FINDINGS  Mild diffuse prominence of the CSF containing spaces is compatible with generalized cerebral atrophy. Scattered and confluent T2/FLAIR hyperintensity within the periventricular and deep white matter are most compatible with chronic small vessel ischemic changes, mild for patient age. A remote subcentimeter a lacunar infarct noted within the periventricular white matter of the right corona radiata (series 8, image 15). Small remote infarct noted within the left pons as well.  No mass lesion, midline shift, or extra-axial fluid collection. Ventricles are normal in size without evidence of hydrocephalus.  There is a 7 mm focus of restricted diffusion within the right middle cerebellar peduncle, suggestive of acute ischemic infarct (series 4, image 8). No associated hemorrhage or significant mass effect. There is a small amount of associated T2/FLAIR hyperintensity. Gray-white matter differentiation is maintained.  The cervicomedullary junction is normal. Pituitary gland is  within normal limits. Pituitary stalk is midline. The globes and optic nerves demonstrate a normal appearance with normal signal intensity.  The bone marrow signal intensity is normal. Calvarium is intact. Visualized upper cervical spine is within normal limits.  Scalp soft tissues are unremarkable.  Paranasal sinuses are clear.  No mastoid effusion.  MRA HEAD FINDINGS  Anterior circulation: Normal flow related enhancement of the included cervical, petrous, cavernous and supra clinoid internal carotid arteries. Mild multi focal atherosclerotic irregularity seen within the cavernous segments of the internal carotid arteries bilaterally without high-grade flow-limiting stenosis, left greater than right. There is approximately 50% stenosis within the cavernous left ICA (series 5, image 71). Patent anterior communicating artery. Normal flow related enhancement of the anterior and middle cerebral arteries, including more distal segments.  Posterior circulation: Vertebral arteries are codominant. Basilar artery is patent, with normal flow related enhancement of the main branch vessels. Normal flow related enhancement of the posterior cerebral arteries.  No large vessel occlusion, hemodynamically significant stenosis, abnormal luminal irregularity, aneurysm within the anterior nor posterior circulation.  IMPRESSION: MRI HEAD IMPRESSION:  1. 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 No intracranial hemorrhage. 2. 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 3. Subcentimeter remote infarcts within the left pons and right periventricular white matter.  MRA HEAD IMPRESSION:  1. No proximal branch occlusion or high-grade flow-limiting stenosis identified within the intracranial circulation. 2. Mild multi  focal atherosclerotic irregularity within the cavernous segments of the internal carotid arteries bilaterally left greater than right. There is  associated short segment stenosis of approximately 50% within the left cavernous carotid artery.   Electronically Signed   By: Jeannine Boga M.D.   On: 01/24/2014 05:33    Scheduled Meds: . aspirin  300 mg Rectal Daily   Or  . aspirin  325 mg Oral Daily  . carvedilol  3.125 mg Oral BID WC  . enoxaparin (LOVENOX) injection  40 mg Subcutaneous Q24H  . insulin aspart  0-15 Units Subcutaneous TID WC  . insulin aspart  0-5 Units Subcutaneous QHS  . metFORMIN  500 mg Oral BID WC  . simvastatin  40 mg Oral QHS   Continuous Infusions: . sodium chloride 100 mL/hr at 01/25/14 0128    Active Problems:   HYPERLIPIDEMIA   TOBACCO ABUSE   HYPERTENSION   CAD, NATIVE VESSEL   Thrombotic stroke   Dizziness   Diabetes   Facial droop    Time spent: [facility] Pager [phone]. If 7PM-7AM, please contact night-coverage at [email or web address], password [email or web address] 01/25/2014, 10:52 AM  LOS: 2 days               81

## 2014-01-25 NOTE — ED Provider Notes (Signed)
I saw and discussed the patient with the resident, reviewed the resident's note and I agree with the findings and plan  .Face to face Exam:  General:  Awake HEENT:  Atraumatic Resp:  Normal effort Abd:  Nondistended Neuro:He is alert and oriented to person, place, and time. He has normal strength. A sensory deficit (decreased sensation on the right side of his face) is present. GCS eye subscore is 4. GCS verbal subscore is 5.  Positive for right-sided facial droop, paresthesias  The patient has an abnormal test of skew, vertical nystagmus   CRITICAL CARE Performed by: Dot Lanes Total critical care time: 30 min Critical care time was exclusive of separately billable procedures and treating other patients. Critical care was necessary to treat or prevent imminent or life-threatening deterioration. Critical care was time spent personally by me on the following activities: development of treatment plan with patient and/or surrogate as well as nursing, discussions with consultants, evaluation of patient's response to treatment, examination of patient, obtaining history from patient or surrogate, ordering and performing treatments and interventions, ordering and review of laboratory studies, ordering and review of radiographic studies, pulse oximetry and re-evaluation of patient's condition.    Dot Lanes, MD 01/25/14 [phone]

## 2014-01-25 NOTE — Progress Notes (Signed)
VASCULAR LAB PRELIMINARY  PRELIMINARY  PRELIMINARY  PRELIMINARY  Carotid Dopplers completed.    Preliminary report:  1-39% ICA stenosis.  Vertebral artery flow is antegrade.  Iantha Fallen, RVT 01/25/2014, 11:39 AM

## 2014-01-26 LAB — GLUCOSE, CAPILLARY
Glucose-Capillary: 203 mg/dL — ABNORMAL HIGH (ref 70–99)
Glucose-Capillary: 230 mg/dL — ABNORMAL HIGH (ref 70–99)
Glucose-Capillary: 157 mg/dL — ABNORMAL HIGH (ref 70–99)

## 2014-01-26 MED ORDER — CLOPIDOGREL BISULFATE 75 MG PO TABS: 75.0000 mg | ORAL_TABLET | Freq: Every day | ORAL | Status: DC

## 2014-01-26 MED ORDER — ASPIRIN 325 MG PO TABS: 325.0000 mg | ORAL_TABLET | Freq: Every day | ORAL | Status: DC

## 2014-01-26 MED ORDER — INSULIN GLARGINE 100 UNIT/ML ~~LOC~~ SOLN
10.0000 [IU] | Freq: Every day | SUBCUTANEOUS | Status: DC
  Filled 2014-01-26: qty 0.1

## 2014-01-26 MED ORDER — INSULIN PEN NEEDLE 30G X 8 MM MISC: 1.0000 | Status: DC | PRN

## 2014-01-26 MED ORDER — LOSARTAN POTASSIUM 100 MG PO TABS: 100.0000 mg | ORAL_TABLET | Freq: Every day | ORAL | Status: DC

## 2014-01-26 MED ORDER — CARVEDILOL 6.25 MG PO TABS: 3.1250 mg | ORAL_TABLET | Freq: Two times a day (BID) | ORAL | Status: DC

## 2014-01-26 MED ORDER — HYDROCODONE-ACETAMINOPHEN 10-500 MG PO TABS: 1.0000 | ORAL_TABLET | Freq: Four times a day (QID) | ORAL | Status: DC | PRN

## 2014-01-26 MED ORDER — INSULIN GLARGINE 100 UNITS/ML SOLOSTAR PEN: 10.0000 [IU] | PEN_INJECTOR | Freq: Every day | SUBCUTANEOUS | Status: DC

## 2014-01-26 NOTE — Discharge Summary (Addendum)
Physician Discharge Summary  [name] [id number] DOB: 1940/11/09 DOA: 01/23/2014  PCP: Tivis Ringer, MD  Admit date: 01/23/2014 Discharge date: 01/26/2014  Time spent: >30 minutes  Recommendations for Outpatient Follow-up:  Follow-up Information   Follow up with SETHI,PRAMODKUMAR P, MD. Schedule an appointment as soon as possible for a visit in 2 months. (Stroke Clinic)    Specialties:  Neurology, Radiology   Contact information:   [address] [place] [address] [phone]       Follow up with Tivis Ringer, MD. (in 1week, call for appt upon discharge)    Specialty:  Internal Medicine   Contact information:   [place] [place] [address] [phone]        Discharge Diagnoses:  Active Problems:   HYPERLIPIDEMIA   TOBACCO ABUSE   HYPERTENSION   CAD, NATIVE VESSEL   Thrombotic stroke   Dizziness   Diabetes   Facial droop uncontrolled type 2 DM LV dysfunction Left knee effusion-follow up with PCP for ortho referral outpatient  Discharge Condition: Improved/stable  Diet recommendation: Modified carbohydrate  Filed Weights   01/23/14 1405 01/23/14 1943  Weight: 81.194 kg (179 lb) 84.188 kg (185 lb 9.6 oz)    History of present illness:  [name] is a 72 y.o. male past medical history significant for diabetes mellitus, hypertension, hyperlipidemia who presents with above complaints. He states that he woke up at about 4 AM today with right facial weakness and some right upper extremity weakness as well. He states that the room felt like it was spinning and he was unable to get his balance. As a result he states he fell x3 today. He reports nausea or vomiting associated with the vertigo. Upon arrival in the ED CT scan of his head was done and showed focal lucency in the left anterior aspect of the body of the pons most likely an old infarct-no acute findings. Neurology was consulted and  admission to [facility] requested.   Hospital Course:  Right middle cerebellar peduncle CVA  -As discussed above CT of head on admission showed focal lucency in anterior aspect of pons-likely old infarct  -MRI was done and revealed 7 mm acute ischemic infarct involving the right middle cerebellar peduncle 2-D echocardiogram was done and showed and EF of [id number] grade 1 diastolic dysfunction, and carotid Doppler showed no significant ICA stenosis. hemoglobin A1c is 9.0, fasting lipid profile and follow.  -Neurology was consulted and saw patient and recommended to continue Continue aspirin 325 mg (increased from 81 mg on admission) but Dr Leonie Man followed up today and pt was changed to plavix. -His blood pressure was monitored and initially permissive hypertension was allowed and subsequently his Cozaar was resumed and he was also maintained on Coreg for blood pressure control. -PT -recommending outpatient PT and this was set up by case management. -He is to followup with neurology outpatient as directed. LV dysfunction -No evidence of CHF -Patient was maintained on Coreg which she is to continue a 6.25 mg twice a day and followup with PCP -His Cozaar was also resumed and his to continue this upon discharge.  Diabetes  -Poorly controlled diabetes-A1c was done and elevated at 9, discussed patient/family Lantus at bedtime was added -His to continue his metformin on discharge. His blood glucose control improved with the addition of Lantus. He is to followup with his PCP for close monitoring of his sugars and further adjustment of his medications as clinically appropriate. HYPERLIPIDEMIA  -Continue  Zocor  TOBACCO ABUSE  -Counseled to quit tobacco  HYPERTENSION  -Continue carvedilol, resume Cozaar and follow up with PCP as above CAD, NATIVE VESSEL  -He remained chest pain-free, he was maintained on carvedilol Left knee effusion -Patient complain of pain in left knee and x-ray showed a moderate effusion.  His pain improved on followup and he states that he had some knee surgery in the past on that knee.he states he would followup with his PCP -he does not remember the name of the orthopedic M.D. that performed the procedure in the past.    Procedures:   Echo  Study Conclusions  - Left ventricle: E/e'>10 consistent with elevated LV filling pressures The cavity size was normal. There was moderate concentric hypertrophy. Systolic function was mildly to moderately reduced. The estimated ejection fraction was in the range of 40% to 45%. Wall motion was normal; there were no regional wall motion abnormalities. There was an increased relative contribution of atrial contraction to ventricular filling. Doppler parameters are consistent with abnormal left ventricular relaxation (grade 1 diastolic dysfunction). - Left atrium: The atrium was mildly dilated. - Right ventricle: The cavity size was mildly dilated. Wall thickness was normal. Transthoracic echocardiography   Carotid Doppler-no significant ICA stenosis   Consultations:   neurology  Discharge Exam: Filed Vitals:   01/26/14 0955  BP: 162/73  Pulse: 70  Temp: 97.6 F (36.4 C)  Resp: 18   am:  General: alert & oriented x 3 In NAD  Cardiovascular: RRR, nl S1 s2  Respiratory: CTAB  Abdomen: soft +BS NT/ND, no masses palpable  Extremities: No cyanosis and no edema. Left knee with ballotable effusion. Nontender no warmth and no erythema    Discharge Instructions You were cared for by a hospitalist during your hospital stay. If you have any questions about your discharge medications or the care you received while you were in the hospital after you are discharged, you can call the unit and asked to speak with the hospitalist on call if the hospitalist that took care of you is not available. Once you are discharged, your primary care physician will handle any further medical issues. Please note that NO REFILLS for any discharge  medications will be authorized once you are discharged, as it is imperative that you return to your primary care physician (or establish a relationship with a primary care physician if you do not have one) for your aftercare needs so that they can reassess your need for medications and monitor your lab values.     Medication List         Plavix 75 MG tablet  Take 1 tablet 75 mg total) by mouth daily.     carvedilol 6.25 MG tablet  Commonly known as:  COREG  Take 0.5 tablets (3.125 mg total) by mouth 2 (two) times daily with a meal.     HYDROcodone-acetaminophen 10-500 MG per tablet  Commonly known as:  LORTAB  Take 1 tablet by mouth every 6 (six) hours as needed for pain.     insulin glargine 100 unit/mL Sopn  Commonly known as:  LANTUS  Inject 0.1 mLs (10 Units total) into the skin at bedtime.     Insulin Pen Needle 30G X 8 MM Misc  Commonly known as:  CAREFINE PEN NEEDLES  Inject 10 each into the skin as needed.     losartan 100 MG tablet  Commonly known as:  COZAAR  Take 1 tablet (100 mg total) by mouth daily.  metFORMIN 500 MG tablet  Commonly known as:  GLUCOPHAGE  Take 500 mg by mouth 2 (two) times daily with a meal.     simvastatin 40 MG tablet  Commonly known as:  ZOCOR  Take 40 mg by mouth at bedtime.       No Known Allergies     Follow-up Information   Follow up with Forbes Cellar, MD. Schedule an appointment as soon as possible for a visit in 2 months. (Stroke Clinic)    Specialties:  Neurology, Radiology   Contact information:   [address] [place] [address] [phone]       Follow up with Tivis Ringer, MD. (in 1week, call for appt upon discharge)    Specialty:  Internal Medicine   Contact information:   [place] [place] [address] [phone]        The results of significant diagnostics from this hospitalization (including imaging, microbiology, ancillary and  laboratory) are listed below for reference.    Significant Diagnostic Studies: Dg Chest 2 View  01/23/2014   CLINICAL DATA:  Dizziness.  Fell.  Smoker.  EXAM: CHEST  2 VIEW  COMPARISON:  None.  FINDINGS: Normal sized heart. Elevated right hemidiaphragm. Mildly prominent interstitial markings with diffuse peribronchial thickening. Thoracic spine degenerative changes. No fracture or pneumothorax seen.  IMPRESSION: 1. Chronic bronchitic changes. 2. Elevated right hemidiaphragm.   Electronically Signed   By: Enrique Sack M.D.   On: 01/23/2014 20:43   Ct Head Wo Contrast  01/23/2014   CLINICAL DATA:  Cough.  EXAM: CT HEAD WITHOUT CONTRAST  TECHNIQUE: Contiguous axial images were obtained from the base of the skull through the vertex without intravenous contrast.  COMPARISON:  MR C SPINE W/O CM dated 01/21/2007 .  FINDINGS: Small lucency is noted left body of the pons. 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 White matter changes are present most consistent with chronic ischemia. No evidence of mass effect. No evidence of hemorrhage. Optic globes are normal. Visualized paranasal sinuses are clear. No acute bony abnormality.  IMPRESSION: 1. Focal lucency in the left anterior aspect of the body of the pons. 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 Gadolinium-enhanced MRI of the brain however should be considered for further evaluation to exclude a focal enhancing lesion. 2. Chronic white matter ischemia.  No acute abnormality.   Electronically Signed   By: Marcello Moores  Register   On: 01/23/2014 17:04   [name]  01/24/2014   CLINICAL DATA:  Right facial weakness with some right upper extremity weakness. Nausea vomiting, vertigo and gait instability. Evaluate for stroke.  EXAM: MRI HEAD WITHOUT CONTRAST  MRA HEAD WITHOUT CONTRAST  TECHNIQUE: Multiplanar, multiecho pulse sequences of the brain and surrounding structures were obtained without  intravenous contrast. Angiographic images of the head were obtained using MRA technique without contrast.  COMPARISON:  Prior CT performed earlier on the same day.  FINDINGS: MRI HEAD FINDINGS  Mild diffuse prominence of the CSF containing spaces is compatible with generalized cerebral atrophy. Scattered and confluent T2/FLAIR hyperintensity within the periventricular and deep white matter are most compatible with chronic small vessel ischemic changes, mild for patient age. A remote subcentimeter a lacunar infarct noted within the periventricular white matter of the right corona radiata (series 8, image 15). Small remote infarct noted within the left pons as well.  No mass lesion, midline shift, or  extra-axial fluid collection. Ventricles are normal in size without evidence of hydrocephalus.  There is a 7 mm focus of restricted diffusion within the right middle cerebellar peduncle, suggestive of acute ischemic infarct (series 4, image 8). No associated hemorrhage or significant mass effect. There is a small amount of associated T2/FLAIR hyperintensity. Gray-white matter differentiation is maintained.  The cervicomedullary junction is normal. Pituitary gland is within normal limits. Pituitary stalk is midline. The globes and optic nerves demonstrate a normal appearance with normal signal intensity.  The bone marrow signal intensity is normal. Calvarium is intact. Visualized upper cervical spine is within normal limits.  Scalp soft tissues are unremarkable.  Paranasal sinuses are clear.  No mastoid effusion.  MRA HEAD FINDINGS  Anterior circulation: Normal flow related enhancement of the included cervical, petrous, cavernous and supra clinoid internal carotid arteries. Mild multi focal atherosclerotic irregularity seen within the cavernous segments of the internal carotid arteries bilaterally without high-grade flow-limiting stenosis, left greater than right. There is approximately 50% stenosis within the cavernous  left ICA (series 5, image 71). Patent anterior communicating artery. Normal flow related enhancement of the anterior and middle cerebral arteries, including more distal segments.  Posterior circulation: Vertebral arteries are codominant. Basilar artery is patent, with normal flow related enhancement of the main branch vessels. Normal flow related enhancement of the posterior cerebral arteries.  No large vessel occlusion, hemodynamically significant stenosis, abnormal luminal irregularity, aneurysm within the anterior nor posterior circulation.  IMPRESSION: MRI HEAD IMPRESSION:  1. 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 No intracranial hemorrhage. 2. 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 3. Subcentimeter remote infarcts within the left pons and right periventricular white matter.  MRA HEAD IMPRESSION:  1. No proximal branch occlusion or high-grade flow-limiting stenosis identified within the intracranial circulation. 2. Mild multi focal atherosclerotic irregularity within the cavernous segments of the internal carotid arteries bilaterally left greater than right. There is associated short segment stenosis of approximately 50% within the left cavernous carotid artery.   Electronically Signed   By: Jeannine Boga M.D.   On: 01/24/2014 05:33   Dg Knee Complete 4 Views Left  01/25/2014   CLINICAL DATA:  Left lower rib pain.  Fall 2 days ago.  EXAM: LEFT KNEE - COMPLETE 4+ VIEW  COMPARISON:  None.  FINDINGS: No convincing fracture. The joint is normally aligned. There are minor marginal osteophytes from the patella. Well-defined foci of ossification lie below the patella projecting within the substance of the proximal patellar tendon.  Bones are demineralized.  There is a moderate joint effusion.  Vascular calcifications are noted posteriorly.  IMPRESSION: No fracture or dislocation.  Moderate joint effusion.   Electronically  Signed   By: Lajean Manes M.D.   On: 01/25/2014 17:25   Mr Jodene Nam Head/brain Wo Cm  01/24/2014   CLINICAL DATA:  Right facial weakness with some right upper extremity weakness. Nausea vomiting, vertigo and gait instability. Evaluate for stroke.  EXAM: MRI HEAD WITHOUT CONTRAST  MRA HEAD WITHOUT CONTRAST  TECHNIQUE: Multiplanar, multiecho pulse sequences of the brain and surrounding structures were obtained without intravenous contrast. Angiographic images of the head were obtained using MRA technique without contrast.  COMPARISON:  Prior CT performed earlier on the same day.  FINDINGS: MRI HEAD FINDINGS  Mild diffuse prominence of the CSF containing spaces is compatible with generalized cerebral atrophy. Scattered and confluent T2/FLAIR hyperintensity within the periventricular and deep white matter are most compatible with  chronic small vessel ischemic changes, mild for patient age. A remote subcentimeter a lacunar infarct noted within the periventricular white matter of the right corona radiata (series 8, image 15). Small remote infarct noted within the left pons as well.  No mass lesion, midline shift, or extra-axial fluid collection. Ventricles are normal in size without evidence of hydrocephalus.  There is a 7 mm focus of restricted diffusion within the right middle cerebellar peduncle, suggestive of acute ischemic infarct (series 4, image 8). No associated hemorrhage or significant mass effect. There is a small amount of associated T2/FLAIR hyperintensity. Gray-white matter differentiation is maintained.  The cervicomedullary junction is normal. Pituitary gland is within normal limits. Pituitary stalk is midline. The globes and optic nerves demonstrate a normal appearance with normal signal intensity.  The bone marrow signal intensity is normal. Calvarium is intact. Visualized upper cervical spine is within normal limits.  Scalp soft tissues are unremarkable.  Paranasal sinuses are clear.  No mastoid effusion.   MRA HEAD FINDINGS  Anterior circulation: Normal flow related enhancement of the included cervical, petrous, cavernous and supra clinoid internal carotid arteries. Mild multi focal atherosclerotic irregularity seen within the cavernous segments of the internal carotid arteries bilaterally without high-grade flow-limiting stenosis, left greater than right. There is approximately 50% stenosis within the cavernous left ICA (series 5, image 71). Patent anterior communicating artery. Normal flow related enhancement of the anterior and middle cerebral arteries, including more distal segments.  Posterior circulation: Vertebral arteries are codominant. Basilar artery is patent, with normal flow related enhancement of the main branch vessels. Normal flow related enhancement of the posterior cerebral arteries.  No large vessel occlusion, hemodynamically significant stenosis, abnormal luminal irregularity, aneurysm within the anterior nor posterior circulation.  IMPRESSION: MRI HEAD IMPRESSION:  1. 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 No intracranial hemorrhage. 2. 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 3. Subcentimeter remote infarcts within the left pons and right periventricular white matter.  MRA HEAD IMPRESSION:  1. No proximal branch occlusion or high-grade flow-limiting stenosis identified within the intracranial circulation. 2. Mild multi focal atherosclerotic irregularity within the cavernous segments of the internal carotid arteries bilaterally left greater than right. There is associated short segment stenosis of approximately 50% within the left cavernous carotid artery.   Electronically Signed   By: Jeannine Boga M.D.   On: 01/24/2014 05:33    Microbiology: No results found for this or any previous visit (from the past 240 hour(s)).   Labs: Basic Metabolic Panel:  Recent Labs Lab 01/23/14 1411 01/25/14 0702   NA 138 142  K 4.3 3.9  CL 101 109  CO2 21 23  GLUCOSE 233* 223*  BUN 17 14  CREATININE 1.01 0.96  CALCIUM 8.7 8.2*   Liver Function Tests:  Recent Labs Lab 01/23/14 1411  AST 16  ALT 14  ALKPHOS 73  BILITOT 0.4  PROT 6.7  ALBUMIN 3.4*   No results found for this basename: LIPASE, AMYLASE,  in the last 168 hours No results found for this basename: AMMONIA,  in the last 168 hours CBC:  Recent Labs Lab 01/23/14 1411  WBC 10.4  NEUTROABS 9.0*  HGB 13.5  HCT 39.8  MCV 90.7  PLT 227   Cardiac Enzymes: No results found for this basename: CKTOTAL, CKMB, CKMBINDEX, TROPONINI,  in the last 168 hours BNP: BNP (last 3 results) No results found for this basename: PROBNP,  in the last 8760 hours CBG:  Recent Labs Lab 01/25/14 1125 01/25/14 1802 01/25/14 2143 01/26/14 0636 01/26/14 1140  GLUCAP 255* 167* 146* 203* 230*       Signed:  [facility] 01/26/2014, 12:43 PM

## 2014-01-26 NOTE — Clinical Documentation Improvement (Signed)
Possible Clinical Conditions?   _______Diabetes Type 1  or 2 _______Controlled or Uncontrolled  Manifestations:  _______DM retinopathy  _______DM PVD _______DM neuropathy   _______DM nephropathy _______Other Condition _______Cannot Clinically determine    Risk Factors: Poorly controlled diabetes, elevated A1c noted per 5/02 progress notes.  Diagnostics: 5/02:  HgbA1c:  9.0 5/02:  Mean plasma glucose: 212  Thank You, Theron Arista, Clinical Documentation Specialist:  [facility] Information Management

## 2014-01-26 NOTE — Care Management Note (Signed)
    Page 1 of 1   01/26/2014     4:06:29 PM CARE MANAGEMENT NOTE 01/26/2014  Patient:  [name],[name]   Account Number:  [id number]  Date Initiated:  01/26/2014  Documentation initiated by:  Lorne Skeens  Subjective/Objective Assessment:   Patient was admitted with CVA. Lives at home with wife.     Action/Plan:   Will follow for discharge needs pending PT/OT evals and physician orders.   Anticipated DC Date:  01/26/2014   Anticipated DC Plan:  HOME/SELF CARE      DC Planning Services  CM consult  OP Neuro Rehab      Choice offered to / List presented to:  C-1 Patient           Status of service:  Completed, signed off Medicare Important Message given?   (If response is "NO", the following Medicare IM given date fields will be blank) Date Medicare IM given:   Date Additional Medicare IM given:    Discharge Disposition:  HOME/SELF CARE  Per UR Regulation:  Reviewed for med. necessity/level of care/duration of stay  If discussed at [place] of Stay Meetings, dates discussed:    Comments:  01/26/14 [facility], MSN, CM- Met with patient and wife to discuss outpatient PT.  Patient is agreeable and has chosen [facility] outpatient neuro rehab.  Information was faxed.  RN updated.

## 2014-01-26 NOTE — Progress Notes (Signed)
Physical Therapy Treatment Patient Details Name: [name] MRN: [id number] DOB: 04-23-1941 Today's Date: 01/26/2014    History of Present Illness 73 y.o. 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    PT Comments    Pt very pleasant & willing to participate in therapy.  Cont's to be somewhat impulsive & unaware of balance deficits as he states multiple times throughout session that he can walk independently.  Pt ambulated entire unit with min guard due to mild unsteadiness.       Follow Up Recommendations  Outpatient PT;Supervision/Assistance - 24 hour;Other (comment)     Equipment Recommendations       Recommendations for Other Services OT consult     Precautions / Restrictions Precautions Precautions: Fall Restrictions Weight Bearing Restrictions: No    Mobility  Bed Mobility                  Transfers Overall transfer level: Needs assistance Equipment used: None Transfers: Sit to/from Stand Sit to Stand: Supervision         General transfer comment: Supervision for safety  Ambulation/Gait Ambulation/Gait assistance: Min assist Ambulation Distance (Feet): 400 Feet Assistive device: Quad cane Gait Pattern/deviations: Step-through pattern;Narrow base of support;Staggering left;Staggering right     General Gait Details: Pt staggering at times especially with head turns in all directions.  Cues for safe use of quad cane.  Pt kept saying that he can walk independently but was more unsteady with cane.     Stairs Stairs: Yes Stairs assistance: Supervision Stair Management: Two rails;Forwards;Alternating pattern Number of Stairs: 4    Wheelchair Mobility    Modified Rankin (Stroke Patients Only) Modified Rankin (Stroke Patients Only) Pre-Morbid Rankin Score: No symptoms Modified Rankin:  Moderately severe disability     Balance                                    Cognition Arousal/Alertness: Awake/alert Behavior During Therapy: Impulsive Overall Cognitive Status: Impaired/Different from baseline Area of Impairment: Safety/judgement;Problem solving         Safety/Judgement: Decreased awareness of deficits   Problem Solving: Difficulty sequencing;Requires verbal cues;Requires tactile cues General Comments: pt continues to be impulsive; may be at baseline for safety awareness.   Unaware of balance deficits as he states he can walk independently.      Exercises      General Comments        Pertinent Vitals/Pain C/o Lt knee pain but did not rate.      Home Living                      Prior Function            PT Goals (current goals can now be found in the care plan section) Acute Rehab PT Goals Patient Stated Goal: to go home PT Goal Formulation: With patient Time For Goal Achievement: 01/31/14 Potential to Achieve Goals: Good Progress towards PT goals: Progressing toward goals    Frequency  Min 4X/week    PT Plan Current plan remains appropriate    Co-evaluation             End of Session Equipment Utilized During Treatment: Gait belt Activity Tolerance: Patient tolerated treatment well Patient left: in chair;with call bell/phone  within reach;with family/visitor present     Time: 1036-1100 PT Time Calculation (min): 24 min  Charges:  $Gait Training: 23-37 mins                      Sena Hitch 01/26/2014, 2:27 PM   Sarajane Marek, PTA [phone] 01/26/2014

## 2014-01-26 NOTE — Progress Notes (Signed)
Stroke Team Progress Note  HISTORY [name] is a 73 y.o. male who went to sleep 01/22/2014 at 11:30 PM feeling normal. He awoke at 0415 hours 01/23/2014 and noted the room was spinning from left to right. This vertiginous sensation was present with eye both open and closed and movement did not effect the vertigo. In addition he noted diplopia when looking to the right and left. When he finally tried to stand he noted he was falling to the left. He fell three times prior to arrival in ED. Pateint was brought to the ED by family. When seen by Dr. Nicole Kindred he was still having vertigo and nausea. He stated that he takes ASA daily. On arrival to ED his BP was 223/80. tPA was not Given: as pt was out of window  SUBJECTIVE The patient's wife is at the bedside.   OBJECTIVE Most recent Vital Signs: Filed Vitals:   01/26/14 0146 01/26/14 0150 01/26/14 0551 01/26/14 0955  BP: 177/70 172/69 179/94 162/73  Pulse: 77 72  70  Temp: 98.1 F (36.7 C)  98.3 F (36.8 C) 97.6 F (36.4 C)  TempSrc: Oral  Oral Oral  Resp: 18  18 18   Height:      Weight:      SpO2: 97%  96% 100%   CBG (last 3)   Recent Labs  01/25/14 2143 01/26/14 0636 01/26/14 1140  GLUCAP 146* 203* 230*   MEDICATIONS  . aspirin  300 mg Rectal Daily   Or  . aspirin  325 mg Oral Daily  . carvedilol  3.125 mg Oral BID WC  . enoxaparin (LOVENOX) injection  40 mg Subcutaneous Q24H  . insulin aspart  0-15 Units Subcutaneous TID WC  . insulin glargine  10 Units Subcutaneous QHS  . losartan  100 mg Oral Daily  . metFORMIN  500 mg Oral BID WC  . simvastatin  40 mg Oral QHS   PRN:  acetaminophen, acetaminophen, meclizine, ondansetron (ZOFRAN) IV, senna-docusate  Diet:  Cardiac thin liquids Activity: Up with assistance DVT Prophylaxis:  Lovenox  CLINICALLY SIGNIFICANT STUDIES Basic Metabolic Panel:   Recent Labs Lab 01/23/14 1411 01/25/14 0702  NA 138 142  K 4.3 3.9  CL 101 109  CO2 21 23  GLUCOSE 233* 223*  BUN 17 14   CREATININE 1.01 0.96  CALCIUM 8.7 8.2*   Liver Function Tests:   Recent Labs Lab 01/23/14 1411  AST 16  ALT 14  ALKPHOS 73  BILITOT 0.4  PROT 6.7  ALBUMIN 3.4*   CBC:   Recent Labs Lab 01/23/14 1411  WBC 10.4  NEUTROABS 9.0*  HGB 13.5  HCT 39.8  MCV 90.7  PLT 227   Coagulation:   Recent Labs Lab 01/23/14 1411  LABPROT 12.9  INR 0.99   Cardiac Enzymes: No results found for this basename: CKTOTAL, CKMB, CKMBINDEX, TROPONINI,  in the last 168 hours Urinalysis:   Recent Labs Lab 01/23/14 1600  COLORURINE YELLOW  LABSPEC 1.025  PHURINE 6.5  GLUCOSEU >1000*  HGBUR SMALL*  BILIRUBINUR NEGATIVE  KETONESUR 15*  PROTEINUR >300*  UROBILINOGEN 0.2  NITRITE NEGATIVE  LEUKOCYTESUR NEGATIVE   Lipid Panel    Component Value Date/Time   CHOL 124 01/24/2014 0406   TRIG 101 01/24/2014 0406   HDL 39* 01/24/2014 0406   CHOLHDL 3.2 01/24/2014 0406   VLDL 20 01/24/2014 0406   LDLCALC 65 01/24/2014 0406   HgbA1C  Lab Results  Component Value Date   HGBA1C 9.0* 01/24/2014  Urine Drug Screen:     Component Value Date/Time   LABOPIA NONE DETECTED 01/23/2014 1601   COCAINSCRNUR NONE DETECTED 01/23/2014 1601   LABBENZ NONE DETECTED 01/23/2014 1601   AMPHETMU NONE DETECTED 01/23/2014 1601   THCU NONE DETECTED 01/23/2014 1601   LABBARB NONE DETECTED 01/23/2014 1601    Alcohol Level:   Recent Labs Lab 01/23/14 1424  ETH <11    Dg Chest 2 View 01/23/2014    1. Chronic bronchitic changes. 2. Elevated right hemidiaphragm.   Ct Head Wo Contrast 01/23/2014    1. Focal lucency in the left anterior aspect of the body of the pons. 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 Gadolinium-enhanced MRI of the brain however should be considered for further evaluation to exclude a focal enhancing lesion. 2. Chronic white matter ischemia.  No acute abnormality.   [name] 01/24/2014    MRI HEAD   1. 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 No intracranial hemorrhage.  2. 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  3. Subcentimeter remote infarcts within the left pons and right periventricular white matter.    MRA HEAD 1. No proximal branch occlusion or high-grade flow-limiting stenosis identified within the intracranial circulation.  2. Mild multi focal atherosclerotic irregularity within the cavernous segments of the internal carotid arteries bilaterally left greater than right. There is associated short segment stenosis of approximately 50% within the left cavernous carotid artery.     2D Echocardiogram  ejection fraction 40-45%. No cardiac source of emboli identified.  Carotid Doppler  No evidence of hemodynamically significant internal carotid artery stenosis. Vertebral artery flow is antegrade.   EKG sinus tachycardia rate 106 beats per minute . Left bundle-branch block. For complete results please see formal report.   Therapy Recommendations  outpatient physical therapy with 24-hour per day supervision recommended.  Physical Exam   Mental Status:  Alert, oriented, thought content appropriate. Speech fluent without evidence of aphasia. Able to follow 3 step commands without difficulty.  Cranial Nerves:  II: Discs flat bilaterally; Visual fields grossly normal, pupils equal, round, reactive to light and accommodation  III,IV, VI: ptosis not present, extra-ocular motions intact bilaterally, with disconjugate movements (right eye is more inferiorly displaced), vertical nystagmus more pronounced when looking up and lateral nystagmus with fast phase to the left (more pronounced when looking to the left)  V,VII: smile symmetric but at rest right NLF decreased. Decreased sensation right side of face. VIII: hearing normal bilaterally  IX,X: gag reflex present  XI: bilateral shoulder shrug  XII: midline tongue extension without atrophy or fasciculations  Motor:  Right : Upper extremity  5/5 Left: Upper extremity 5/5  Lower extremity 5/5 Lower extremity 5/5  Tone and bulk:normal tone throughout; no atrophy noted  Sensory: Pinprick and light touch intact throughout, bilaterally  Deep Tendon Reflexes:  Right: Upper Extremity Left: Upper extremity  biceps (C-5 to C-6) 2/4 biceps (C-5 to C-6) 2/4  tricep (C7) 2/4 triceps (C7) 2/4  Brachioradialis (C6) 2/4 Brachioradialis (C6) 2/4  Lower Extremity Lower Extremity  quadriceps (L-2 to L-4) 0/4 quadriceps (L-2 to L-4) 0/4  Achilles (S1) 0/4 Achilles (S1) 0/4  Plantars:  Right: downgoing Left: downgoing  Cerebellar:  normal finger-to-nose, normal heel-to-shin test  Gait: not tested due to concern of falling  ASSESSMENT [name] is a 73 y.o. 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 The infarct was felt to be thrombotic secondary to small vessel disease. On aspirin 81 mg orally every day prior to admission. Now on aspirin 325 mg orally every day for secondary stroke prevention. Patient with resultant diplopia, dizziness, and right facial numbness. Stroke work up completed.   Diabetes mellitus - hemoglobin A1c 9.0, goal < 7.0  Hypertension  Hyperlipidemia - on Zocor prior to admission - cholesterol 124;  LDL 65  Ongoing tobacco use   Remote infarcts within the left pons and right periventricular white matter.  Hospital day # 3  TREATMENT/PLAN  Change aspirin to clopidogrel 75 mg orally every day for secondary stroke prevention.  Outpatient physical therapy with 24-hour per day provision recommended.  No further stroke workup indicated. Patient has a 10-15% risk of having another stroke over the next year, the highest risk is within 2 weeks of the most recent stroke/TIA (risk of having a stroke following a stroke or TIA is the same). Ongoing risk factor  control by Primary Care Physician Stroke Service will sign off. Please call should any needs arise. Follow up with Dr. Leonie Man, [facility], in 2 months.  Burnetta Sabin, MSN, RN, ANVP-BC, ANP-BC, [facility] Pager: [phone] 01/26/2014 12:41 PM  I have personally obtained a history, examined the patient, evaluated imaging results, and formulated the assessment and plan of care. I agree with the above. Antony Contras, MD    To contact Stroke Continuity provider, please refer to [email or web address] After hours, contact General Neurology

## 2014-01-26 NOTE — Evaluation (Signed)
Occupational Therapy Evaluation Patient Details Name: [name] MRN: [id number] DOB: [birth year] Today's Date: 01/26/2014    History of Present Illness 73 y.o. 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   Clinical Impression   Pt presents with below problem list. Pt independent with ADLs, PTA. Feel pt will benefit from acute OT to increase independence and safety prior to d/c. Pt with inconsistencies with field testing on left side. Recommending pt get a full visual field assessment at opthamologist.     Follow Up Recommendations  Outpatient OT;Supervision - Intermittent (full visual field assessment at opthamologist)   Equipment Recommendations  None recommended by OT    Recommendations for Other Services       Precautions / Restrictions Precautions Precautions: Fall Precaution Comments: c/o numbness on rt side of face  Restrictions Weight Bearing Restrictions: No      Mobility Bed Mobility                  Transfers Overall transfer level: Needs assistance   Transfers: Sit to/from Stand Sit to Stand: Supervision              Balance                                            ADL Overall ADL's : Needs assistance/impaired                     Lower Body Dressing: Sit to/from stand;Supervision/safety   Toilet Transfer: Min guard;Ambulation (couch)       Tub/ Shower Transfer: Ambulation;Supervision/safety (quad cane; practiced stepping over)   Functional mobility during ADLs: Min guard (quad cane)-tried walking with and without cane.  General ADL Comments: Recommended sitting for bathing and dressing. Recommended spouse be with him for shower transfer. Discussed rugs. Educated on signs/symptoms of stroke and importance of getting help right away. Recommended to quit  smoking and to avoid canned foods, as they have increased sodium.   Recommended pt get a full visual field assessment completed. Spouse states MD said no driving. Educated on gross motor activity pt could do at home. Recommended pt use cane for balance at this time-tried walking with and without it.     Vision       Tracking/Visual Pursuits: Able to track stimulus in all quads without difficulty Saccades: Within functional limits Visual fields-inconsistent in left field           Perception     Praxis      Pertinent Vitals/Pain No apparent distress.     Hand Dominance     Extremity/Trunk Assessment Upper Extremity Assessment Upper Extremity Assessment: RUE deficits/detail RUE Coordination: decreased gross motor   Lower Extremity Assessment Lower Extremity Assessment: Defer to PT evaluation       Communication Communication Communication: No difficulties   Cognition Arousal/Alertness: Awake/alert Behavior During Therapy: Impulsive Overall Cognitive Status:  (unsure if this is baseline) Area of Impairment: Safety/judgement         Safety/Judgement: Decreased awareness of safety         General Comments       Exercises       Shoulder Instructions      Home Living Family/patient expects to be discharged to:: Private  residence Living Arrangements: Spouse/significant other Available Help at Discharge: Family (wife states she is not there 24/7) Type of Home: House Home Access: Stairs to enter [employer] of Steps: 7 Entrance Stairs-Rails: Can reach both;Left;Right Home Layout: Two level;Able to live on main level with bedroom/bathroom Alternate Level Stairs-Number of Steps: 12 Alternate Level Stairs-Rails: Right;Left;Can reach both Bathroom Shower/Tub: Tub/shower unit;Walk-in shower         Home Equipment: Shower seat;Shower seat - built in          Prior Functioning/Environment Level of Independence: Independent         Comments: pt driving and independent with all ADLs     OT Diagnosis: Other (comment);Disturbance of vision (decreased balance)   OT Problem List: Impaired vision/perception;Impaired balance (sitting and/or standing);Decreased coordination;Decreased safety awareness;Decreased knowledge of use of DME or AE   OT Treatment/Interventions: Self-care/ADL training;DME and/or AE instruction;Therapeutic activities;Visual/perceptual remediation/compensation;Patient/family education;Balance training    OT Goals(Current goals can be found in the care plan section) Acute Rehab OT Goals Patient Stated Goal: go home OT Goal Formulation: With patient Time For Goal Achievement: 02/02/14 Potential to Achieve Goals: Good ADL Goals Pt Will Perform Lower Body Dressing: with modified independence;sit to/from stand (including gathering clothing from drawers) Pt Will Transfer to Toilet: with modified independence;ambulating Additional ADL Goal #1: Pt will participate in further vision assessment.  OT Frequency: Min 2X/week   Barriers to D/C:            Co-evaluation              End of Session Equipment Utilized During Treatment: Gait belt  Activity Tolerance: Patient tolerated treatment well Patient left: in chair;with family/visitor present   Time: [id number] OT Time Calculation (min): 32 min Charges:  OT General Charges $OT Visit: 1 Procedure OT Evaluation $Initial OT Evaluation Tier I: 1 Procedure OT Treatments $Therapeutic Activity: 8-22 mins G-CodesBenito Mccreedy OTR/L [id number] 01/26/2014, 6:36 PM

## 2014-01-26 NOTE — Progress Notes (Signed)
Utilization Review Completed.Neoma Laming T Dowell5/12/2013

## 2014-01-30 ENCOUNTER — Ambulatory Visit: Payer: Medicare Other | Attending: Internal Medicine | Admitting: Physical Therapy

## 2014-01-30 DIAGNOSIS — M6281 Muscle weakness (generalized): Secondary | ICD-10-CM | POA: Insufficient documentation

## 2014-01-30 DIAGNOSIS — R269 Unspecified abnormalities of gait and mobility: Secondary | ICD-10-CM | POA: Insufficient documentation

## 2014-01-30 DIAGNOSIS — IMO0001 Reserved for inherently not codable concepts without codable children: Secondary | ICD-10-CM | POA: Insufficient documentation

## 2014-02-03 ENCOUNTER — Ambulatory Visit: Payer: Medicare Other | Admitting: Rehabilitative and Restorative Service Providers"

## 2014-02-05 ENCOUNTER — Ambulatory Visit: Payer: Medicare Other | Admitting: Physical Therapy

## 2014-02-10 ENCOUNTER — Ambulatory Visit: Payer: Medicare Other | Admitting: Physical Therapy

## 2014-02-12 ENCOUNTER — Ambulatory Visit: Payer: Medicare Other | Admitting: Physical Therapy

## 2014-02-17 ENCOUNTER — Ambulatory Visit: Payer: Medicare Other | Admitting: Physical Therapy

## 2014-02-19 ENCOUNTER — Ambulatory Visit: Payer: Medicare Other | Admitting: Physical Therapy

## 2014-02-24 ENCOUNTER — Ambulatory Visit: Payer: Medicare Other | Admitting: Physical Therapy

## 2014-02-26 ENCOUNTER — Ambulatory Visit: Payer: Medicare Other | Admitting: Physical Therapy

## 2014-04-17 ENCOUNTER — Telehealth: Payer: Self-pay

## 2014-04-17 NOTE — Telephone Encounter (Signed)
Spoke to patient. On wait list. Scheduled earlier appt for 04/21/14. Patient verbalized understanding.

## 2014-04-21 ENCOUNTER — Ambulatory Visit (INDEPENDENT_AMBULATORY_CARE_PROVIDER_SITE_OTHER): Payer: Medicare Other | Admitting: Nurse Practitioner

## 2014-04-21 ENCOUNTER — Encounter: Payer: Self-pay | Admitting: Nurse Practitioner

## 2014-04-21 VITALS — BP 117/62 | HR 46 | Temp 97.7°F | Ht 70.0 in | Wt 183.0 lb

## 2014-04-21 DIAGNOSIS — Z87891 Personal history of nicotine dependence: Secondary | ICD-10-CM

## 2014-04-21 DIAGNOSIS — E1139 Type 2 diabetes mellitus with other diabetic ophthalmic complication: Secondary | ICD-10-CM

## 2014-04-21 DIAGNOSIS — I6330 Cerebral infarction due to thrombosis of unspecified cerebral artery: Secondary | ICD-10-CM

## 2014-04-21 DIAGNOSIS — I639 Cerebral infarction, unspecified: Secondary | ICD-10-CM

## 2014-04-21 MED ORDER — CLOPIDOGREL BISULFATE 75 MG PO TABS: 75.0000 mg | ORAL_TABLET | Freq: Every day | ORAL | Status: DC

## 2014-04-21 NOTE — Progress Notes (Signed)
I agree with the above plan 

## 2014-04-21 NOTE — Progress Notes (Signed)
PATIENT: [name] DOB: 1941/04/12  REASON FOR VISIT: hospital follow up for stroke HISTORY FROM: patient  HISTORY OF PRESENT ILLNESS: [name] is a 73 y.o. male who comes in for first office visit post stroke.  He went to sleep 01/22/2014 at 11:30 PM feeling normal. He awoke at 0415 hours 01/23/2014 and noted the room was spinning from left to right. This vertiginous sensation was present with eyes both open and closed and movement did not effect the vertigo. In addition he noted diplopia when looking to the right and left. When he finally tried to stand he noted he was falling to the left. He fell three times prior to arrival in ED. Pateint was brought to the ED by family. When seen by Dr. Nicole Kindred he was still having vertigo and nausea. He stated that he takes ASA daily. On arrival to ED his BP was 223/80. tPA was not given as pt was out of window.  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 Subcentimeter remote infarcts within the left pons and right periventricular white matter. MRA showed no proximal branch occlusion or high-grade flow-limiting stenosis identified within the intracranial circulation. Mild multi focal atherosclerotic irregularity within the cavernous segments of the internal carotid arteries bilaterally left greater than right. There is associated short segment stenosis of approximately 50% within the left cavernous carotid artery.  2D Echocardiogram showed an ejection fraction 40-45%. No cardiac source of emboli identified. Carotid Doppler with no evidence of hemodynamically significant internal carotid artery stenosis.Therapy recommendations were for outpatient physical therapy with 24-hour per day supervision. LDL 65, Hgb a1c 9.0.  Since discharge his diplopia resolved quickly and he has only mild numbness on the right lower half of  his face with a change in taste sensation. He states he stumble sometimes, veering to the right but has not fallen. Blood pressure is well controlled, it is 117/62 in the office today. He is quit smoking completely. He's changed his diet and reduced his blood sugars to average fasting between 110 and 130 daily. He's been diabetic since age 89 and has been on oral medications.  He does not want to use insulin. His hemoglobin A1c has not been rechecked since hospital discharge. He was to be discharged on Plavix but states he was never started on that medication, has been using 325 mg aspirin daily instead. He denies any significant bruising or bleeding.  REVIEW OF SYSTEMS: Full 14 system review of systems performed and notable only for:  Eye itching, eye redness, joint pain, walking difficulty, itching and all the systems negative  ALLERGIES: No Known Allergies  HOME MEDICATIONS: Outpatient Prescriptions Prior to Visit  Medication Sig Dispense Refill  . carvedilol (COREG) 6.25 MG tablet Take 0.5 tablets (3.125 mg total) by mouth 2 (two) times daily with a meal.      . HYDROcodone-acetaminophen (LORTAB) 10-500 MG per tablet Take 1 tablet by mouth every 6 (six) hours as needed for pain.  30 tablet  0  . losartan (COZAAR) 100 MG tablet Take 1 tablet (100 mg total) by mouth daily.  30 tablet  0  . metFORMIN (GLUCOPHAGE) 500 MG tablet Take 500 mg by mouth 2 (two) times daily with a meal.        . simvastatin (ZOCOR) 40 MG tablet Take 40 mg by mouth at bedtime.        . clopidogrel (  PLAVIX) 75 MG tablet Take 1 tablet (75 mg total) by mouth daily with breakfast.  30 tablet  0   PHYSICAL EXAM Filed Vitals:   04/21/14 0851  BP: 117/62  Pulse: 46  Temp: 97.7 F (36.5 C)  TempSrc: Oral  Height: 5\' 10"  (1.778 m)  Weight: 183 lb (83.008 kg)   Body mass index is 26.26 kg/(m^2).  Generalized: Well developed, in no acute distress, pleasant AA male Head: normocephalic and atraumatic. Oropharynx benign    Neck: Supple, no carotid bruits  Cardiac: Regular rate rhythm, no murmur  Musculoskeletal: No deformity   Neurological examination  Mentation: Alert oriented to time, place, history taking. Follows all commands speech and language fluent  Cranial nerve II-XII: Pupils were equal round reactive to light extraocular movements were full, visual field were full on confrontational test. Smile symmetric but at rest right NLF decreased. Decreased sensation right side of face.  Hearing was intact to finger rubbing bilaterally. Uvula tongue midline. head turning and shoulder shrug and were normal and symmetric.Tongue protrusion into cheek strength decreased on the right.  Motor: normal bulk and tone, full strength in the BUE, BLE, fine finger movements normal, no pronator drift. No focal weakness  Sensory: normal and symmetric to light touch, pinprick, and vibration  Coordination: finger-nose-finger, heel-to-shin bilaterally, no dysmetria  Reflexes: Deep tendon reflexes in the upper and lower extremities are present and symmetric.  Gait and Station: Rising up from seated position without assistance, normal stance, without trunk ataxia, moderate stride, good arm swing, smooth turning, able to perform tiptoe, and heel walking without difficulty. Romberg negative.  DIAGNOSTIC DATA (LABS, IMAGING, TESTING) - I reviewed patient records, labs, notes, testing and imaging myself where available.  Lab Results  Component Value Date   WBC 10.4 01/23/2014   HGB 13.5 01/23/2014   HCT 39.8 01/23/2014   MCV 90.7 01/23/2014   PLT 227 01/23/2014      Component Value Date/Time   NA 142 01/25/2014 0702   K 3.9 01/25/2014 0702   CL 109 01/25/2014 0702   CO2 23 01/25/2014 0702   GLUCOSE 223* 01/25/2014 0702   BUN 14 01/25/2014 0702   CREATININE 0.96 01/25/2014 0702   CALCIUM 8.2* 01/25/2014 0702   PROT 6.7 01/23/2014 1411   ALBUMIN 3.4* 01/23/2014 1411   AST 16 01/23/2014 1411   ALT 14 01/23/2014 1411   ALKPHOS 73 01/23/2014 1411   BILITOT  0.4 01/23/2014 1411   GFRNONAA 82* 01/25/2014 0702   GFRAA >90 01/25/2014 0702   Lab Results  Component Value Date   CHOL 124 01/24/2014   HDL 39* 01/24/2014   LDLCALC 65 01/24/2014   TRIG 101 01/24/2014   CHOLHDL 3.2 01/24/2014   Lab Results  Component Value Date   HGBA1C 9.0* 01/24/2014    ASSESSMENT: Mr. Zalyn Sannicolas is a 73 y.o. AA male presenting with vertigo, diplopia, and falls TPA was not administered secondary to late presentation. 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 The infarct was felt to be thrombotic secondary to small vessel disease. Patient with resultant intermittent dizziness, and right facial numbness. Vascular risk factors of Diabetes mellitus (50 year history); Hypertension; Hyperlipidemia; former tobacco use; Remote infarcts within the left pons and right periventricular white matter.  PLAN: Start clopidogrel 75 mg orally every day  for secondary stroke prevention. When you start this medication, stop the aspirin. Maintain strict control of hypertension with blood pressure goal below 140/90, diabetes with hemoglobin  A1c goal below 7% and lipids with LDL cholesterol goal below 70 mg/dL. Commended patient on quitting smoking. Followup in the future in 3 months with Dr. Erlinda Hong or Leonie Man, sooner as needed.  Meds ordered this encounter  Medications  . DISCONTD: ASPIRIN ADULT LOW STRENGTH PO    Sig: Take 123 mg by mouth daily.  . clopidogrel (PLAVIX) 75 MG tablet    Sig: Take 1 tablet (75 mg total) by mouth daily with breakfast.    Dispense:  90 tablet    Refill:  3    Order Specific Question:  Supervising Provider    Answer:  Leonie Man, PRAMOD [[id number]]   Rudi Rummage LAM, MSN, FNP-BC, A/GNP-C 04/21/2014, 10:23 AM [facility] [address] [place], [place] [address] [phone]  Note: This document was prepared with digital dictation and possible smart phrase technology. Any transcriptional errors that result  from this process are unintentional.

## 2014-04-21 NOTE — Patient Instructions (Signed)
Start clopidogrel 75 mg orally every day  for secondary stroke prevention. When you start this medication, stop the aspirin. Maintain strict control of hypertension with blood pressure goal below 140/90, diabetes with hemoglobin A1c goal below 7% and lipids with LDL cholesterol goal below 70 mg/dL.  Followup in the future in 3 months, sooner as needed.  Stroke Prevention Some medical conditions and behaviors are associated with an increased chance of having a stroke. You may prevent a stroke by making healthy choices and managing medical conditions. HOW CAN I REDUCE MY RISK OF HAVING A STROKE?   Stay physically active. Get at least 30 minutes of activity on most or all days.  Do not smoke. It may also be helpful to avoid exposure to secondhand smoke.  Limit alcohol use. Moderate alcohol use is considered to be:  No more than 2 drinks per day for men.  No more than 1 drink per day for nonpregnant women.  Eat healthy foods. This involves:  Eating 5 or more servings of fruits and vegetables a day.  Making dietary changes that address high blood pressure (hypertension), high cholesterol, diabetes, or obesity.  Manage your cholesterol levels.  Making food choices that are high in fiber and low in saturated fat, trans fat, and cholesterol may control cholesterol levels.  Take any prescribed medicines to control cholesterol as directed by your health care provider.  Manage your diabetes.  Controlling your carbohydrate and sugar intake is recommended to manage diabetes.  Take any prescribed medicines to control diabetes as directed by your health care provider.  Control your hypertension.  Making food choices that are low in salt (sodium), saturated fat, trans fat, and cholesterol is recommended to manage hypertension.  Take any prescribed medicines to control hypertension as directed by your health care provider.  Maintain a healthy weight.  Reducing calorie intake and making  food choices that are low in sodium, saturated fat, trans fat, and cholesterol are recommended to manage weight.  Stop drug abuse.  Avoid taking birth control pills.  Talk to your health care provider about the risks of taking birth control pills if you are over 57 years old, smoke, get migraines, or have ever had a blood clot.  Get evaluated for sleep disorders (sleep apnea).  Talk to your health care provider about getting a sleep evaluation if you snore a lot or have excessive sleepiness.  Take medicines only as directed by your health care provider.  For some people, aspirin or blood thinners (anticoagulants) are helpful in reducing the risk of forming abnormal blood clots that can lead to stroke. If you have the irregular heart rhythm of atrial fibrillation, you should be on a blood thinner unless there is a good reason you cannot take them.  Understand all your medicine instructions.  Make sure that other conditions (such as anemia or atherosclerosis) are addressed. SEEK IMMEDIATE MEDICAL CARE IF:   You have sudden weakness or numbness of the face, arm, or leg, especially on one side of the body.  Your face or eyelid droops to one side.  You have sudden confusion.  You have trouble speaking (aphasia) or understanding.  You have sudden trouble seeing in one or both eyes.  You have sudden trouble walking.  You have dizziness.  You have a loss of balance or coordination.  You have a sudden, severe headache with no known cause.  You have new chest pain or an irregular heartbeat. Any of these symptoms may represent a serious problem  that is an emergency. Do not wait to see if the symptoms will go away. Get medical help at once. Call your local emergency services (911 in [place] Do not drive yourself to the hospital. Document Released: 10/19/2004 Document Revised: 01/26/2014 Document Reviewed: 03/14/2013 [facility] Patient Information 2015 [place], [place]. This information is  not intended to replace advice given to you by your health care provider. Make sure you discuss any questions you have with your health care provider.

## 2014-05-05 ENCOUNTER — Ambulatory Visit: Payer: Self-pay | Admitting: Nurse Practitioner

## 2014-07-16 ENCOUNTER — Encounter: Payer: Self-pay | Admitting: Neurology

## 2014-07-16 ENCOUNTER — Ambulatory Visit: Payer: Medicare Other | Admitting: Neurology

## 2014-07-16 ENCOUNTER — Ambulatory Visit (INDEPENDENT_AMBULATORY_CARE_PROVIDER_SITE_OTHER): Payer: Medicare Other | Admitting: Neurology

## 2014-07-16 VITALS — BP 152/78 | HR 76 | Ht 70.0 in | Wt 183.4 lb

## 2014-07-16 DIAGNOSIS — I63011 Cerebral infarction due to thrombosis of right vertebral artery: Secondary | ICD-10-CM

## 2014-07-16 DIAGNOSIS — E1159 Type 2 diabetes mellitus with other circulatory complications: Secondary | ICD-10-CM

## 2014-07-16 DIAGNOSIS — I10 Essential (primary) hypertension: Secondary | ICD-10-CM

## 2014-07-16 DIAGNOSIS — Z87891 Personal history of nicotine dependence: Secondary | ICD-10-CM

## 2014-07-16 NOTE — Patient Instructions (Signed)
-   check BP and glucose at home - had blood draw yesterday and follow up with PCP next Tuesday for stroke risk factor modification - continue the plavix but stop the ASA 325mg  - continue zocor for stroke prevention - follow up as needed.

## 2014-07-16 NOTE — Progress Notes (Signed)
PATIENT: [name] DOB: [birth year]  REASON FOR VISIT: hospital follow up for stroke HISTORY FROM: patient  HISTORY OF PRESENT ILLNESS: [name] is a 73 y.o. male who comes in for first office visit post stroke.  He went to sleep 01/22/2014 at 11:30 PM feeling normal. He awoke at 0415 hours 01/23/2014 and noted the room was spinning from left to right. This vertiginous sensation was present with eyes both open and closed and movement did not effect the vertigo. In addition he noted diplopia when looking to the right and left. When he finally tried to stand he noted he was falling to the left. He fell three times prior to arrival in ED. Pateint was brought to the ED by family. When seen by Dr. Nicole Kindred he was still having vertigo and nausea. He stated that he takes ASA daily. On arrival to ED his BP was 223/80. tPA was not given as pt was out of window.  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 Subcentimeter remote infarcts within the left pons and right periventricular white matter. MRA showed no proximal branch occlusion or high-grade flow-limiting stenosis identified within the intracranial circulation. Mild multi focal atherosclerotic irregularity within the cavernous segments of the internal carotid arteries bilaterally left greater than right. There is associated short segment stenosis of approximately 50% within the left cavernous carotid artery.  2D Echocardiogram showed an ejection fraction 40-45%. No cardiac source of emboli identified. Carotid Doppler with no evidence of hemodynamically significant internal carotid artery stenosis.Therapy recommendations were for outpatient physical therapy with 24-hour per day supervision. LDL 65, Hgb a1c 9.0.  Since discharge his diplopia resolved quickly and he has only mild numbness on the right lower half of  his face with a change in taste sensation. He states he stumble sometimes, veering to the right but has not fallen. Blood pressure is well controlled, it is 117/62 in the office today. He is quit smoking completely. He's changed his diet and reduced his blood sugars to average fasting between 110 and 130 daily. He's been diabetic since age 58 and has been on oral medications.  He does not want to use insulin. His hemoglobin A1c has not been rechecked since hospital discharge. He was to be discharged on Plavix but states he was never started on that medication, has been using 325 mg aspirin daily instead. He denies any significant bruising or bleeding.  Interval history: During the interval time, he was started on plavix and instructed to stop ASA 325mg . However, he stated that he still taking ASA 325mg  along with plavix. I instructed him to stop ASA. He stated that he has not had any stroke like symptoms. He sometimes leaning toward right on walking if too tired. He intermittently has left finger tips numbness. He did not check BP at home and today was 152/78. He said his glucose level at home 120-130. He had blood draw yesterday and he will go to see his PCP next Tuesday.  REVIEW OF SYSTEMS: Full 14 system review of systems performed and notable only for:  Eye itching, eye redness, joint pain, walking difficulty, itching and all the systems negative  ALLERGIES: No Known Allergies  HOME MEDICATIONS: Outpatient Prescriptions Prior to Visit  Medication Sig Dispense Refill  . carvedilol (COREG) 6.25 MG tablet Take 0.5 tablets (3.125 mg total) by mouth 2 (two) times daily with a meal.      .  HYDROcodone-acetaminophen (LORTAB) 10-500 MG per tablet Take 1 tablet by mouth every 6 (six) hours as needed for pain.  30 tablet  0  . losartan (COZAAR) 100 MG tablet Take 1 tablet (100 mg total) by mouth daily.  30 tablet  0  . metFORMIN (GLUCOPHAGE) 500 MG tablet Take 500 mg by mouth 2 (two) times daily with a  meal.        . simvastatin (ZOCOR) 40 MG tablet Take 40 mg by mouth at bedtime.        . clopidogrel (PLAVIX) 75 MG tablet Take 1 tablet (75 mg total) by mouth daily with breakfast.  30 tablet  0   PHYSICAL EXAM Filed Vitals:   07/16/14 0904  BP: 152/78  Pulse: 76  Height: 5\' 10"  (1.778 m)  Weight: 183 lb 6.4 oz (83.19 kg)   Body mass index is 26.32 kg/(m^2).  Generalized: Well developed, in no acute distress, pleasant AA male Head: normocephalic and atraumatic. Oropharynx benign  Neck: Supple, no carotid bruits  Cardiac: Regular rate rhythm, no murmur  Musculoskeletal: No deformity   Neurological examination  Mentation: Alert oriented to time, place, history taking. Follows all commands speech and language fluent  Cranial nerve II-XII: Pupils were equal round reactive to light extraocular movements were full, visual field were full on confrontational test. Smile symmetric but at rest right NLF decreased. Decreased sensation right side of face, V2/V3 distribution, 70% of left.  Hearing was intact to finger rubbing bilaterally. Uvula tongue midline. head turning and shoulder shrug and were normal and symmetric.Tongue protrusion into cheek strength decreased on the right.  Motor: normal bulk and tone, full strength in the BUE, BLE, fine finger movements normal, no pronator drift. No focal weakness  Sensory: normal and symmetric to light touch, pinprick, and vibration  Coordination: finger-nose-finger, heel-to-shin bilaterally, no dysmetria  Reflexes: Deep tendon reflexes in the upper and lower extremities are present and symmetric.  Gait and Station: Rising up from seated position without assistance, normal stance, without trunk ataxia, moderate stride, good arm swing, smooth turning, able to perform tiptoe, and heel walking without difficulty. Romberg negative. Wide-based gait noted, mild difficulty with tandem gait.  DIAGNOSTIC DATA (LABS, IMAGING, TESTING) - I reviewed patient records,  labs, notes, testing and imaging myself where available.  Lab Results  Component Value Date   WBC 10.4 01/23/2014   HGB 13.5 01/23/2014   HCT 39.8 01/23/2014   MCV 90.7 01/23/2014   PLT 227 01/23/2014      Component Value Date/Time   NA 142 01/25/2014 0702   K 3.9 01/25/2014 0702   CL 109 01/25/2014 0702   CO2 23 01/25/2014 0702   GLUCOSE 223* 01/25/2014 0702   BUN 14 01/25/2014 0702   CREATININE 0.96 01/25/2014 0702   CALCIUM 8.2* 01/25/2014 0702   PROT 6.7 01/23/2014 1411   ALBUMIN 3.4* 01/23/2014 1411   AST 16 01/23/2014 1411   ALT 14 01/23/2014 1411   ALKPHOS 73 01/23/2014 1411   BILITOT 0.4 01/23/2014 1411   GFRNONAA 82* 01/25/2014 0702   GFRAA >90 01/25/2014 0702   Lab Results  Component Value Date   CHOL 124 01/24/2014   HDL 39* 01/24/2014   LDLCALC 65 01/24/2014   TRIG 101 01/24/2014   CHOLHDL 3.2 01/24/2014   Lab Results  Component Value Date   HGBA1C 9.0* 01/24/2014    ASSESSMENT: Mr. Merritt Achee is a 73 y.o. AA male was admitted in 01/2014 due to vertigo, diplopia, and falls. MRI revealed  a 7 mm acute ischemic infarct involving the right middle cerebellar peduncle as well as old left pon and periventricular WM lacunar strokes. The acute infarct was felt to be thrombotic secondary to small vessel disease. Vascular risk factors of Diabetes mellitus (50 year history); Hypertension; Hyperlipidemia; former tobacco use; Remote infarcts within the left pons and right periventricular white matter. He is doing well, and close follow up with PCP for stroke risk factor modification. Continue plavix and zocor. Stop ASA  PLAN: - continue plavix and zocor for stroke prevention - stop ASA 325mg   - follow up with PCP for stroke risk factor modification. Maintain strict control of hypertension with blood pressure goal below 140/90, diabetes with hemoglobin A1c goal below 7% and lipids with LDL cholesterol goal below 70 mg/dL.  - continue to quit smoking - check BP and glucose at home, record and bring over to PCP visit - RTC  PRN  No orders of the defined types were placed in this encounter.   Rosalin Hawking, MD PhD [facility] [address] [place], [place] [address] [phone]  Patient Instructions  - check BP and glucose at home - had blood draw yesterday and follow up with PCP next Tuesday for stroke risk factor modification - continue the plavix but stop the ASA 325mg  - continue zocor for stroke prevention - follow up as needed.

## 2014-11-20 ENCOUNTER — Other Ambulatory Visit (HOSPITAL_COMMUNITY): Payer: Self-pay | Admitting: Radiology

## 2014-11-20 DIAGNOSIS — J441 Chronic obstructive pulmonary disease with (acute) exacerbation: Secondary | ICD-10-CM

## 2014-11-20 DIAGNOSIS — R0602 Shortness of breath: Secondary | ICD-10-CM

## 2014-11-25 ENCOUNTER — Encounter (HOSPITAL_COMMUNITY): Payer: Medicare PPO

## 2014-11-27 ENCOUNTER — Ambulatory Visit (HOSPITAL_COMMUNITY)
Admission: RE | Admit: 2014-11-27 | Discharge: 2014-11-27 | Disposition: A | Discharge status: Home / Self Care | Payer: Medicare Other | Source: Ambulatory Visit | Attending: Internal Medicine | Admitting: Internal Medicine

## 2014-11-27 ENCOUNTER — Ambulatory Visit (HOSPITAL_BASED_OUTPATIENT_CLINIC_OR_DEPARTMENT_OTHER): Payer: Medicare Other | Admitting: Radiology

## 2014-11-27 ENCOUNTER — Other Ambulatory Visit (HOSPITAL_COMMUNITY): Payer: Self-pay | Admitting: Internal Medicine

## 2014-11-27 DIAGNOSIS — I447 Left bundle-branch block, unspecified: Secondary | ICD-10-CM

## 2014-11-27 DIAGNOSIS — R0602 Shortness of breath: Secondary | ICD-10-CM

## 2014-11-27 DIAGNOSIS — J441 Chronic obstructive pulmonary disease with (acute) exacerbation: Secondary | ICD-10-CM | POA: Diagnosis not present

## 2014-11-27 DIAGNOSIS — I2510 Atherosclerotic heart disease of native coronary artery without angina pectoris: Secondary | ICD-10-CM

## 2014-11-27 LAB — PULMONARY FUNCTION TEST
DL/VA % pred: 67 %
DL/VA: 3.12 ml/min/mmHg/L
DLCO cor % pred: 31 %
DLCO cor: 10.04 ml/min/mmHg
DLCO unc % pred: 31 %
DLCO unc: 10.04 ml/min/mmHg
FEF 25-75 Post: 1.63 L/sec
FEF 25-75 Pre: 1.43 L/sec
FEF2575-%Change-Post: 13 %
FEF2575-%Pred-Post: 66 %
FEF2575-%Pred-Pre: 58 %
FEV1-%Change-Post: 2 %
FEV1-%Pred-Post: 66 %
FEV1-%Pred-Pre: 64 %
FEV1-Post: 1.91 L
FEV1-Pre: 1.86 L
FEV1FVC-%Change-Post: 5 %
FEV1FVC-%Pred-Pre: 98 %
FEV6-%Change-Post: -2 %
FEV6-%Pred-Post: 66 %
FEV6-%Pred-Pre: 68 %
FEV6-Post: 2.41 L
FEV6-Pre: 2.47 L
FEV6FVC-%Pred-Post: 105 %
FEV6FVC-%Pred-Pre: 105 %
FVC-%Change-Post: -2 %
FVC-%Pred-Post: 63 %
FVC-%Pred-Pre: 64 %
FVC-Post: 2.41 L
FVC-Pre: 2.47 L
Post FEV1/FVC ratio: 79 %
Post FEV6/FVC ratio: 100 %
Pre FEV1/FVC ratio: 75 %
Pre FEV6/FVC Ratio: 100 %
RV % pred: 67 %
RV: 1.66 L
TLC % pred: 59 %
TLC: 4.22 L

## 2014-11-27 MED ORDER — ALBUTEROL SULFATE (2.5 MG/3ML) 0.083% IN NEBU
2.5000 mg | INHALATION_SOLUTION | Freq: Once | RESPIRATORY_TRACT | Status: AC
  Administered 2014-11-27: 2.5 mg via RESPIRATORY_TRACT

## 2014-11-27 NOTE — Progress Notes (Signed)
Echocardiogram performed.  

## 2014-12-16 ENCOUNTER — Encounter: Payer: Self-pay | Admitting: Cardiovascular Disease

## 2014-12-16 ENCOUNTER — Ambulatory Visit (INDEPENDENT_AMBULATORY_CARE_PROVIDER_SITE_OTHER): Payer: Medicare Other | Admitting: Cardiovascular Disease

## 2014-12-16 VITALS — BP 160/80 | HR 76 | Ht 70.0 in | Wt 174.8 lb

## 2014-12-16 DIAGNOSIS — I10 Essential (primary) hypertension: Secondary | ICD-10-CM

## 2014-12-16 DIAGNOSIS — I5022 Chronic systolic (congestive) heart failure: Secondary | ICD-10-CM | POA: Diagnosis not present

## 2014-12-16 MED ORDER — FUROSEMIDE 40 MG PO TABS: 40.0000 mg | ORAL_TABLET | Freq: Every day | ORAL | Status: DC

## 2014-12-16 MED ORDER — POTASSIUM CHLORIDE CRYS ER 20 MEQ PO TBCR: 20.0000 meq | EXTENDED_RELEASE_TABLET | Freq: Every day | ORAL | Status: DC

## 2014-12-16 MED ORDER — CARVEDILOL 6.25 MG PO TABS: ORAL_TABLET | ORAL | Status: DC

## 2014-12-16 NOTE — Patient Instructions (Addendum)
Your physician has recommended you make the following change in your medication:  1) START Coreg 6.25 mg twice a day 2) START Lasix 40 mg daily 3) START Potassium 20 mg daily  Your physician recommends that you return for lab work in: April 11th (BMET)   Your physician recommends that you schedule a follow-up appointment in: April 11th with Dr. Acie Fredrickson

## 2014-12-16 NOTE — Progress Notes (Signed)
Cardiology Office Note   Date:  12/16/2014   ID:  [name], DOB 06/26/1941, MRN [id number]  PCP:  Tivis Ringer, MD  Cardiologist:   [name], MD   Chief Complaint  Patient presents with  . Congestive Heart Failure   Problem list: 1. Essential hypertension-  2. History of stroke - April 2015 3. Acute on chronic congestive heart failure    History of Present Illness: [name] is a 74 y.o. male who presents for evaluation for recently found CHF He has noticed that he is fatigued recently.  He has a long history of hypertension. He has a history of mildly reduced left ventricle systolic function. He was recent seen by his pulmonary doctors or worsening shortness of breath. He was found have pneumonia. An echocardiogram revealed that he has severe left trigger dysfunction with an ejection fraction of around 25%. He denies having any chest pain. He eats a low salt diet. His wife is a Microbiologist at [employer] and feeds him a very low-salt diet.  He denies any chest pain. He has shortness of breath with exertion. He occasionally has shortness of breath at rest. He denies any syncopal episodes.  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  Past Medical History  Diagnosis Date  . Diabetes mellitus   . Hyperlipidemia   . Allergy   . Cataract   . Colon polyps 2012    Colonoscopy  . Diverticulosis 2012    Colonoscopy     Past Surgical History  Procedure Laterality Date  . Cataract extraction      both eyes  . Neck surgery    . Colon surgery      to repair intestines as child  . Knee arthroscopy      left  . Colonoscopy  10/20/2011    Procedure: COLONOSCOPY;  Surgeon: Inda Castle, MD;  Location: [facility];  Service: Endoscopy;  Laterality: N/A;  . Hot hemostasis  10/20/2011    Procedure: HOT HEMOSTASIS (ARGON PLASMA COAGULATION/BICAP);  Surgeon: Inda Castle, MD;   Location: [facility];  Service: Endoscopy;  Laterality: N/A;     Current Outpatient Prescriptions  Medication Sig Dispense Refill  . carvedilol (COREG) 6.25 MG tablet Take 0.5 tablets (3.125 mg total) by mouth 2 (two) times daily with a meal.    . clopidogrel (PLAVIX) 75 MG tablet Take 1 tablet (75 mg total) by mouth daily with breakfast. 90 tablet 3  . HYDROcodone-acetaminophen (LORTAB) 10-500 MG per tablet Take 1 tablet by mouth every 6 (six) hours as needed for pain. 30 tablet 0  . losartan (COZAAR) 100 MG tablet Take 1 tablet (100 mg total) by mouth daily. 30 tablet 0  . metFORMIN (GLUCOPHAGE) 500 MG tablet Take 500 mg by mouth 2 (two) times daily with a meal.      . simvastatin (ZOCOR) 40 MG tablet Take 40 mg by mouth at bedtime.       No current facility-administered medications for this visit.    Allergies:   Review of patient's allergies indicates no known allergies.    Social History:  The patient  reports that he quit smoking about 9 months ago. His smoking use included Cigarettes. He has never used smokeless tobacco. He reports that he drinks alcohol. He reports that he does not use illicit drugs.   Family History:  The patient's family history is negative for Colon cancer,  Esophageal cancer, Stomach cancer, Anesthesia problems, Hypotension, Malignant hyperthermia, and Pseudochol deficiency.    ROS:  Please see the history of present illness.    Review of Systems: Constitutional:  denies fever, chills, diaphoresis, appetite change and fatigue.  HEENT: denies photophobia, eye pain, redness, hearing loss, ear pain, congestion, sore throat, rhinorrhea, sneezing, neck pain, neck stiffness and tinnitus.  Respiratory: admits to SOB, DOE,    Cardiovascular: denies chest pain, palpitations and leg swelling.  Gastrointestinal: denies nausea, vomiting, abdominal pain, diarrhea, constipation, blood in stool.  Genitourinary: denies dysuria, urgency, frequency, hematuria, flank pain  and difficulty urinating.  Musculoskeletal: denies  myalgias, back pain, joint swelling, arthralgias and gait problem.   Skin: denies pallor, rash and wound.  Neurological: denies dizziness, seizures, syncope, weakness, light-headedness, numbness and headaches.   Hematological: denies adenopathy, easy bruising, personal or family bleeding history.  Psychiatric/ Behavioral: denies suicidal ideation, mood changes, confusion, nervousness, sleep disturbance and agitation.       All other systems are reviewed and negative.    PHYSICAL EXAM: VS:  BP 160/80 mmHg  Pulse 76  Ht 5\' 10"  (1.778 m)  Wt 174 lb 12.8 oz (79.289 kg)  BMI 25.08 kg/m2 , BMI Body mass index is 25.08 kg/(m^2). GEN: Well nourished, well developed, in no acute distress HEENT: normal Neck: no JVD, carotid bruits, or masses Cardiac: RRR; no murmurs, rubs, or gallops,no edema  Respiratory:  clear to auscultation bilaterally, normal work of breathing GI: soft, nontender, nondistended, + BS MS: no deformity or atrophy Skin: warm and dry, no rash Neuro:  Strength and sensation are intact Psych: normal   EKG:  EKG is ordered today. The ekg ordered today demonstrates :  Normal sinus rhythm at 76. Sinus arrhythmia. He has left ventricular hypertrophy with QRS widening and repolarization every now.   Recent Labs: 01/23/2014: ALT 14; Hemoglobin 13.5; Platelets 227 01/25/2014: BUN 14; Creatinine 0.96; Potassium 3.9; Sodium 142    Lipid Panel    Component Value Date/Time   CHOL 124 01/24/2014 0406   TRIG 101 01/24/2014 0406   HDL 39* 01/24/2014 0406   CHOLHDL 3.2 01/24/2014 0406   VLDL 20 01/24/2014 0406   LDLCALC 65 01/24/2014 0406      Wt Readings from Last 3 Encounters:  12/16/14 174 lb 12.8 oz (79.289 kg)  07/16/14 183 lb 6.4 oz (83.19 kg)  04/21/14 183 lb (83.008 kg)      Other studies Reviewed: Additional studies/ records that were reviewed today include: . Review of the above records demonstrates:     ASSESSMENT AND PLAN:  1. Essential hypertension-  - his blood pressure remains elevated. We will add Lasix 40 mg a day as well as potassium chloride 20 mg a day. This will help his hypertension as well as his CHF. He's very cautious about his diet. If his blood pressure remains elevated after these changes, we will try him on valsartan instead of losartan.  2. History of stroke - April 2015- he's not felt well since his stroke last year. He still has some episodes. He'll follow-up with his general medical doctor.  3. Acute on chronic congestive heart failure- [name] has a history of mild CHF by echocardiogram last year. His most recent echo shows severe left ventricular dysfunction.   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 He does not have any episodes of chest pain so there is no evidence of ischemic cardiomyopathy. We'll need to get a stress Myoview study at some point in the  future.  We'll start him on Lasix 40 mg a day and potassium chloride 20 mg a day.  We'll increase carvedilol to 6.25 g twice day. We'll consider adding Aldactone at some point if needed.  We will continue to titrate his other medications.  I'll see him in 2 and half to 3 weeks.- April 11 at a work in slot.    Current medicines are reviewed at length with the patient today.  The patient does not have concerns regarding medicines.  The following changes have been made:  no change  Labs/ tests ordered today include:  No orders of the defined types were placed in this encounter.     Disposition:   FU with me in 2-3 weeks     Signed, Nahser, Wonda Cheng, MD  12/16/2014 11:04 AM    [address], [place], [place]  [address] Phone: [phone]; Fax: [phone]

## 2014-12-22 ENCOUNTER — Telehealth: Payer: Self-pay | Admitting: Cardiovascular Disease

## 2014-12-22 NOTE — Telephone Encounter (Signed)
Spoke with patient and advised him that he is cleared to travel per Dr. Acie Fredrickson.  Patient verbalized understanding and gratitude and states he will not leave until after his appointment with Dr. Acie Fredrickson on 4/11.

## 2014-12-22 NOTE — Telephone Encounter (Signed)
New message    Patient wife calling wants to know can patient fly with his medical condition. Leaving on 4.11.2016.

## 2015-01-04 ENCOUNTER — Encounter: Payer: Self-pay | Admitting: Cardiovascular Disease

## 2015-01-04 ENCOUNTER — Other Ambulatory Visit (INDEPENDENT_AMBULATORY_CARE_PROVIDER_SITE_OTHER): Payer: Medicare Other | Admitting: *Deleted

## 2015-01-04 ENCOUNTER — Ambulatory Visit (INDEPENDENT_AMBULATORY_CARE_PROVIDER_SITE_OTHER): Payer: Medicare Other | Admitting: Cardiovascular Disease

## 2015-01-04 VITALS — BP 120/50 | HR 76 | Ht 70.0 in | Wt 166.1 lb

## 2015-01-04 DIAGNOSIS — I5022 Chronic systolic (congestive) heart failure: Secondary | ICD-10-CM | POA: Diagnosis not present

## 2015-01-04 NOTE — Progress Notes (Signed)
Cardiology Office Note   Date:  01/04/2015   ID:  [name], DOB 1941/02/21, MRN [id number]  PCP:  Tivis Ringer, MD  Cardiologist:   [name], MD   Chief Complaint  Patient presents with  . Follow-up   Problem list: 1. Essential hypertension-  2. History of stroke - April 2015 3. Acute on chronic congestive heart failure    History of Present Illness: [name] is a 74 y.o. male who presents for evaluation for recently found CHF He has noticed that he is fatigued recently.  He has a long history of hypertension. He has a history of mildly reduced left ventricle systolic function. He was recent seen by his pulmonary doctors or worsening shortness of breath. He was found have pneumonia. An echocardiogram revealed that he has severe left trigger dysfunction with an ejection fraction of around 25%. He denies having any chest pain. He eats a low salt diet. His wife is a Microbiologist at [facility] and feeds him a very low-salt diet.  He denies any chest pain. He has shortness of breath with exertion. He occasionally has shortness of breath at rest. He denies any syncopal episodes.  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  January 04, 2015;  He is feeling much better. We've added Lasix and potassium to his medical regimen.  Breathing well.   Past Medical History  Diagnosis Date  . Diabetes mellitus   . Hyperlipidemia   . Allergy   . Cataract   . Colon polyps 2012    Colonoscopy  . Diverticulosis 2012    Colonoscopy     Past Surgical History  Procedure Laterality Date  . Cataract extraction      both eyes  . Neck surgery    . Colon surgery      to repair intestines as child  . Knee arthroscopy      left  . Colonoscopy  10/20/2011    Procedure: COLONOSCOPY;  Surgeon: Inda Castle, MD;  Location: [facility];  Service: Endoscopy;  Laterality: N/A;  . Hot hemostasis   10/20/2011    Procedure: HOT HEMOSTASIS (ARGON PLASMA COAGULATION/BICAP);  Surgeon: Inda Castle, MD;  Location: [facility];  Service: Endoscopy;  Laterality: N/A;     Current Outpatient Prescriptions  Medication Sig Dispense Refill  . carvedilol (COREG) 6.25 MG tablet Take one tablet by mouth daily 30 tablet 6  . clopidogrel (PLAVIX) 75 MG tablet Take 1 tablet (75 mg total) by mouth daily with breakfast. 90 tablet 3  . furosemide (LASIX) 40 MG tablet Take 1 tablet (40 mg total) by mouth daily. 90 tablet 3  . glimepiride (AMARYL) 1 MG tablet Take 1 mg by mouth 2 (two) times daily.    Marland Kitchen losartan (COZAAR) 100 MG tablet Take 1 tablet (100 mg total) by mouth daily. 30 tablet 0  . metFORMIN (GLUCOPHAGE) 500 MG tablet Take 500 mg by mouth 2 (two) times daily with a meal.      . potassium chloride SA (K-DUR,KLOR-CON) 20 MEQ tablet Take 1 tablet (20 mEq total) by mouth daily. 90 tablet 3  . simvastatin (ZOCOR) 40 MG tablet Take 40 mg by mouth at bedtime.       No current facility-administered medications for this visit.    Allergies:   Review of patient's allergies indicates no known allergies.    Social History:  The patient  reports that  he quit smoking about 10 months ago. His smoking use included Cigarettes. He has never used smokeless tobacco. He reports that he drinks alcohol. He reports that he does not use illicit drugs.   Family History:  The patient's family history is negative for Colon cancer, Esophageal cancer, Stomach cancer, Anesthesia problems, Hypotension, Malignant hyperthermia, and Pseudochol deficiency.    ROS:  Please see the history of present illness.    Review of Systems: Constitutional:  denies fever, chills, diaphoresis, appetite change and fatigue.  HEENT: denies photophobia, eye pain, redness, hearing loss, ear pain, congestion, sore throat, rhinorrhea, sneezing, neck pain, neck stiffness and tinnitus.  Respiratory: admits to SOB, DOE,    Cardiovascular:  denies chest pain, palpitations and leg swelling.  Gastrointestinal: denies nausea, vomiting, abdominal pain, diarrhea, constipation, blood in stool.  Genitourinary: denies dysuria, urgency, frequency, hematuria, flank pain and difficulty urinating.  Musculoskeletal: denies  myalgias, back pain, joint swelling, arthralgias and gait problem.   Skin: denies pallor, rash and wound.  Neurological: denies dizziness, seizures, syncope, weakness, light-headedness, numbness and headaches.   Hematological: denies adenopathy, easy bruising, personal or family bleeding history.  Psychiatric/ Behavioral: denies suicidal ideation, mood changes, confusion, nervousness, sleep disturbance and agitation.       All other systems are reviewed and negative.    PHYSICAL EXAM: VS:  BP 120/50 mmHg  Pulse 76  Ht 5\' 10"  (1.778 m)  Wt 166 lb 1.9 oz (75.352 kg)  BMI 23.84 kg/m2  SpO2 97% , BMI Body mass index is 23.84 kg/(m^2). GEN: Well nourished, well developed, in no acute distress HEENT: normal Neck: no JVD, carotid bruits, or masses Cardiac: RRR; no murmurs, rubs, or gallops,no edema  Respiratory:  clear to auscultation bilaterally, normal work of breathing GI: soft, nontender, nondistended, + BS MS: no deformity or atrophy Skin: warm and dry, no rash Neuro:  Strength and sensation are intact Psych: normal   EKG:  EKG is not ordered today.   Recent Labs: 01/23/2014: ALT 14; Hemoglobin 13.5; Platelets 227 01/25/2014: BUN 14; Creatinine 0.96; Potassium 3.9; Sodium 142    Lipid Panel    Component Value Date/Time   CHOL 124 01/24/2014 0406   TRIG 101 01/24/2014 0406   HDL 39* 01/24/2014 0406   CHOLHDL 3.2 01/24/2014 0406   VLDL 20 01/24/2014 0406   LDLCALC 65 01/24/2014 0406      Wt Readings from Last 3 Encounters:  01/04/15 166 lb 1.9 oz (75.352 kg)  12/16/14 174 lb 12.8 oz (79.289 kg)  07/16/14 183 lb 6.4 oz (83.19 kg)      Other studies Reviewed: Additional studies/ records that  were reviewed today include: . Review of the above records demonstrates:    ASSESSMENT AND PLAN:  1. Essential hypertension-  BP is much better.  Continue current meds.   2. History of stroke - April 2015- he's not felt well since his stroke last year. He still has some episodes. He'll follow-up with his general medical doctor.  3. Acute on chronic congestive heart failure- Mr. Kerin has a history of mild CHF by echocardiogram last year. His most recent echo shows severe left ventricular dysfunction.   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 He does not have any episodes of chest pain so there is no evidence of ischemic cardiomyopathy. We'll need to get a stress Myoview study at some point in the future.  He's doing well very well on the Lasix and potassium. He's also doing quite well on carvedilol. We will  consider adding Aldactone at some point in the future.   Current medicines are reviewed at length with the patient today.  The patient does not have concerns regarding medicines.  The following changes have been made:  no change  Labs/ tests ordered today include:  No orders of the defined types were placed in this encounter.     Disposition:   FU with me in 3 months    Signed, Nahser, Wonda Cheng, MD  01/04/2015 3:59 PM    [facility] [address], [place], [place]  [address] Phone: [phone]; Fax: [phone]

## 2015-01-04 NOTE — Patient Instructions (Addendum)
Medication Instructions:  Your physician recommends that you continue on your current medications as directed. Please refer to the Current Medication list given to you today.   Labwork: Your physician recommends that you have lab work:  TODAY - basic metabolic panel   Testing/Procedures: Your physician has requested that you have an echocardiogram in 3 months BEFORE your visit with Dr. Acie Fredrickson. Echocardiography is a painless test that uses sound waves to create images of your heart. It provides your doctor with information about the size and shape of your heart and how well your heart's chambers and valves are working. This procedure takes approximately one hour. There are no restrictions for this procedure.   Follow-Up: Your physician recommends that you schedule a follow-up appointment in: 3 months with Dr. Acie Fredrickson (needs echo before office visit)

## 2015-01-05 LAB — BASIC METABOLIC PANEL
BUN: 28 mg/dL — ABNORMAL HIGH (ref 6–23)
CO2: 26 mEq/L (ref 19–32)
Calcium: 9.2 mg/dL (ref 8.4–10.5)
Chloride: 108 mEq/L (ref 96–112)
Creatinine, Ser: 1.42 mg/dL (ref 0.40–1.50)
GFR: 63.15 mL/min (ref >60.00)
Glucose, Bld: 108 mg/dL — ABNORMAL HIGH (ref 70–99)
Potassium: 5.3 mEq/L — ABNORMAL HIGH (ref 3.5–5.1)
Sodium: 139 mEq/L (ref 135–145)

## 2015-01-06 ENCOUNTER — Telehealth: Payer: Self-pay | Admitting: Nurse Practitioner

## 2015-01-06 DIAGNOSIS — I10 Essential (primary) hypertension: Secondary | ICD-10-CM

## 2015-01-06 DIAGNOSIS — I5022 Chronic systolic (congestive) heart failure: Secondary | ICD-10-CM

## 2015-01-06 MED ORDER — POTASSIUM CHLORIDE CRYS ER 20 MEQ PO TBCR: 10.0000 meq | EXTENDED_RELEASE_TABLET | Freq: Every day | ORAL | Status: DC

## 2015-01-06 NOTE — Telephone Encounter (Signed)
Called patient and reviewed lab results and plan of care to reduce potassium supplement to 10 meq daily and repeat lab work in 1 month.  Lab appointment scheduled for 5/17 and medication change documented in patient's chart.  Patient verbalized understanding and agreement.

## 2015-02-09 ENCOUNTER — Other Ambulatory Visit (INDEPENDENT_AMBULATORY_CARE_PROVIDER_SITE_OTHER): Payer: Medicare Other

## 2015-02-09 DIAGNOSIS — I5022 Chronic systolic (congestive) heart failure: Secondary | ICD-10-CM

## 2015-02-09 DIAGNOSIS — I10 Essential (primary) hypertension: Secondary | ICD-10-CM

## 2015-02-09 LAB — BASIC METABOLIC PANEL
BUN: 27 mg/dL — ABNORMAL HIGH (ref 6–23)
CO2: 28 mEq/L (ref 19–32)
Calcium: 9.7 mg/dL (ref 8.4–10.5)
Chloride: 105 mEq/L (ref 96–112)
Creatinine, Ser: 1.5 mg/dL (ref 0.40–1.50)
GFR: 59.27 mL/min — ABNORMAL LOW (ref >60.00)
Glucose, Bld: 163 mg/dL — ABNORMAL HIGH (ref 70–99)
Potassium: 5.1 mEq/L (ref 3.5–5.1)
Sodium: 137 mEq/L (ref 135–145)

## 2015-03-31 ENCOUNTER — Other Ambulatory Visit: Payer: Self-pay | Admitting: Cardiovascular Disease

## 2015-03-31 ENCOUNTER — Other Ambulatory Visit: Payer: Self-pay

## 2015-03-31 ENCOUNTER — Ambulatory Visit (HOSPITAL_COMMUNITY): Payer: Medicare Other | Attending: Cardiovascular Disease

## 2015-03-31 ENCOUNTER — Other Ambulatory Visit (HOSPITAL_COMMUNITY): Payer: Medicare Other

## 2015-03-31 DIAGNOSIS — I5022 Chronic systolic (congestive) heart failure: Secondary | ICD-10-CM

## 2015-03-31 DIAGNOSIS — I509 Heart failure, unspecified: Secondary | ICD-10-CM | POA: Diagnosis present

## 2015-03-31 DIAGNOSIS — I517 Cardiomegaly: Secondary | ICD-10-CM | POA: Diagnosis not present

## 2015-03-31 DIAGNOSIS — I358 Other nonrheumatic aortic valve disorders: Secondary | ICD-10-CM | POA: Insufficient documentation

## 2015-04-05 ENCOUNTER — Other Ambulatory Visit: Payer: Self-pay

## 2015-04-05 ENCOUNTER — Telehealth: Payer: Self-pay | Admitting: Nurse Practitioner

## 2015-04-05 DIAGNOSIS — I10 Essential (primary) hypertension: Secondary | ICD-10-CM

## 2015-04-05 DIAGNOSIS — I5022 Chronic systolic (congestive) heart failure: Secondary | ICD-10-CM

## 2015-04-05 MED ORDER — CARVEDILOL 12.5 MG PO TABS: ORAL_TABLET | ORAL | Status: DC

## 2015-04-05 MED ORDER — CLOPIDOGREL BISULFATE 75 MG PO TABS: 75.0000 mg | ORAL_TABLET | Freq: Every day | ORAL | Status: DC

## 2015-04-05 NOTE — Telephone Encounter (Signed)
-----   Message from [name], MD sent at 04/01/2015  5:42 PM EDT ----- persistantly low EF. increse coreg to 12. 5 bid  Will need an office visit in 2 months

## 2015-04-05 NOTE — Telephone Encounter (Signed)
Reviewed results of echo and plan of care with patient who verbalized understanding and agreement to increase Coreg to 12.5 mg twice daily.  I advised him that I will send refills of the 12.5 mg tabs and that until that time he may take 2 of his 6.25 mg tabs twice daily.  I scheduled him to see Dr. Acie Fredrickson on 8/10 for follow-up.  After a few minutes, the patient called me back with his pill bottle to verify that he has the correct understanding of the instructions given.  He verbalized understanding and thanked me for the call.

## 2015-05-05 ENCOUNTER — Ambulatory Visit (INDEPENDENT_AMBULATORY_CARE_PROVIDER_SITE_OTHER): Payer: Medicare Other | Admitting: Cardiovascular Disease

## 2015-05-05 ENCOUNTER — Encounter: Payer: Self-pay | Admitting: Cardiovascular Disease

## 2015-05-05 VITALS — BP 118/66 | HR 67 | Ht 70.0 in | Wt 166.4 lb

## 2015-05-05 DIAGNOSIS — I5022 Chronic systolic (congestive) heart failure: Secondary | ICD-10-CM | POA: Diagnosis not present

## 2015-05-05 MED ORDER — SPIRONOLACTONE 25 MG PO TABS: 12.5000 mg | ORAL_TABLET | Freq: Every day | ORAL | Status: DC

## 2015-05-05 NOTE — Patient Instructions (Signed)
Medication Instructions:  START Aldactone 12.5 mg once daily  Labwork: Your physician recommends that you return for lab work in: 1 week for basic metabolic panel   Testing/Procedures: None Ordered   Follow-Up: Your physician wants you to follow-up in: 6 months with Dr. Acie Fredrickson.  You will receive a reminder letter in the mail two months in advance. If you don't receive a letter, please call our office to schedule the follow-up appointment.

## 2015-05-05 NOTE — Progress Notes (Signed)
Cardiology Office Note   Date:  05/05/2015   ID:  [name], DOB [birth year], MRN [id number]  PCP:  Tivis Ringer, MD  Cardiologist:   [name], MD   Chief Complaint  Patient presents with  . Follow-up    chronic systolic chf   Problem list: 1. Essential hypertension-  2. History of stroke - April 2015 3. Acute on chronic congestive heart failure    History of Present Illness: [name] is a 74 y.o. male who presents for evaluation for recently found CHF He has noticed that he is fatigued recently.  He has a long history of hypertension. He has a history of mildly reduced left ventricle systolic function. He was recent seen by his pulmonary doctors or worsening shortness of breath. He was found have pneumonia. An echocardiogram revealed that he has severe left ventricular dysfunction with an ejection fraction of around 25%. He denies having any chest pain. He eats a low salt diet. His wife is a Microbiologist at [employer] [facility] and feeds him a very low-salt diet.  He denies any chest pain. He has shortness of breath with exertion. He occasionally has shortness of breath at rest. He denies any syncopal episodes.  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  January 04, 2015;  He is feeling much better. We've added Lasix and potassium to his medical regimen.  Breathing well.   May 05, 2015: Doing well. Minimal dyspnea Occasional episodes of orthostatic hypotension.  Overall doing well.    Past Medical History  Diagnosis Date  . Diabetes mellitus   . Hyperlipidemia   . Allergy   . Cataract   . Colon polyps 2012    Colonoscopy  . Diverticulosis 2012    Colonoscopy     Past Surgical History  Procedure Laterality Date  . Cataract extraction      both eyes  . Neck surgery    . Colon surgery      to repair intestines as child  . Knee arthroscopy      left  . Colonoscopy   10/20/2011    Procedure: COLONOSCOPY;  Surgeon: Inda Castle, MD;  Location: [facility];  Service: Endoscopy;  Laterality: N/A;  . Hot hemostasis  10/20/2011    Procedure: HOT HEMOSTASIS (ARGON PLASMA COAGULATION/BICAP);  Surgeon: Inda Castle, MD;  Location: [facility];  Service: Endoscopy;  Laterality: N/A;     Current Outpatient Prescriptions  Medication Sig Dispense Refill  . carvedilol (COREG) 12.5 MG tablet Take one tablet by mouth daily 62 tablet 11  . clopidogrel (PLAVIX) 75 MG tablet Take 1 tablet (75 mg total) by mouth daily with breakfast. 90 tablet 1  . furosemide (LASIX) 40 MG tablet Take 1 tablet (40 mg total) by mouth daily. 90 tablet 3  . glimepiride (AMARYL) 1 MG tablet Take 1 mg by mouth 2 (two) times daily.    Marland Kitchen losartan (COZAAR) 100 MG tablet Take 1 tablet (100 mg total) by mouth daily. 30 tablet 0  . metFORMIN (GLUCOPHAGE) 500 MG tablet Take 500 mg by mouth 2 (two) times daily with a meal.      . potassium chloride SA (K-DUR,KLOR-CON) 20 MEQ tablet Take 0.5 tablets (10 mEq total) by mouth daily. 90 tablet 3  . simvastatin (ZOCOR) 40 MG tablet Take 40 mg by mouth at bedtime.       No current facility-administered medications for  this visit.    Allergies:   Review of patient's allergies indicates no known allergies.    Social History:  The patient  reports that he quit smoking about 14 months ago. His smoking use included Cigarettes. He has never used smokeless tobacco. He reports that he drinks alcohol. He reports that he does not use illicit drugs.   Family History:  The patient's family history is negative for Colon cancer, Esophageal cancer, Stomach cancer, Anesthesia problems, Hypotension, Malignant hyperthermia, and Pseudochol deficiency.    ROS:  Please see the history of present illness.    Review of Systems: Constitutional:  denies fever, chills, diaphoresis, appetite change and fatigue.  HEENT: denies photophobia, eye pain, redness, hearing loss,  ear pain, congestion, sore throat, rhinorrhea, sneezing, neck pain, neck stiffness and tinnitus.  Respiratory: admits to SOB, DOE,    Cardiovascular: denies chest pain, palpitations and leg swelling.  Gastrointestinal: denies nausea, vomiting, abdominal pain, diarrhea, constipation, blood in stool.  Genitourinary: denies dysuria, urgency, frequency, hematuria, flank pain and difficulty urinating.  Musculoskeletal: denies  myalgias, back pain, joint swelling, arthralgias and gait problem.   Skin: denies pallor, rash and wound.  Neurological: denies dizziness, seizures, syncope, weakness, light-headedness, numbness and headaches.   Hematological: denies adenopathy, easy bruising, personal or family bleeding history.  Psychiatric/ Behavioral: denies suicidal ideation, mood changes, confusion, nervousness, sleep disturbance and agitation.       All other systems are reviewed and negative.    PHYSICAL EXAM: VS:  BP 118/66 mmHg  Pulse 67  Ht 5\' 10"  (1.778 m)  Wt 75.479 kg (166 lb 6.4 oz)  BMI 23.88 kg/m2  SpO2 90% , BMI Body mass index is 23.88 kg/(m^2). GEN: Well nourished, well developed, in no acute distress HEENT: normal Neck: no JVD, carotid bruits, or masses Cardiac: RRR; no murmurs, rubs, or gallops,no edema  Respiratory:  clear to auscultation bilaterally, normal work of breathing GI: soft, nontender, nondistended, + BS MS: no deformity or atrophy Skin: warm and dry, no rash Neuro:  Strength and sensation are intact Psych: normal   EKG:  EKG is not ordered today.   Recent Labs: 02/09/2015: BUN 27*; Creatinine, Ser 1.50; Potassium 5.1; Sodium 137    Lipid Panel    Component Value Date/Time   CHOL 124 01/24/2014 0406   TRIG 101 01/24/2014 0406   HDL 39* 01/24/2014 0406   CHOLHDL 3.2 01/24/2014 0406   VLDL 20 01/24/2014 0406   LDLCALC 65 01/24/2014 0406      Wt Readings from Last 3 Encounters:  05/05/15 75.479 kg (166 lb 6.4 oz)  01/04/15 75.352 kg (166 lb 1.9  oz)  12/16/14 79.289 kg (174 lb 12.8 oz)      Other studies Reviewed: Additional studies/ records that were reviewed today include: . Review of the above records demonstrates:    ASSESSMENT AND PLAN:  1. Essential hypertension-  BP is much better.  Continue current meds.   2. History of stroke - April 2015- he's not felt well since his stroke last year. He still has some episodes. He'll follow-up with his general medical doctor.  3. Acute on chronic congestive heart failure- [name] has a history of mild CHF by echocardiogram last year. His most recent echo shows severe left ventricular dysfunction.   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 He does not have any episodes of chest pain so there is no evidence of ischemic cardiomyopathy.   We rechecked his echo several months after initiating standard CHF therapy  and his ejection fraction had not changed much. We will add Aldactone 12.5 mg once a day. We'll check a basic metabolic profile in one week. I'll see him again in approximately 6 weeks. I anticipate getting an echocardiogram  in several months. If his ejection fraction remains low then we will need to consider referring him to electrophysiology.  He's already having some episodes of orthostatic hypotension. I'm not sure that we can titrate up his medications to much further but we will continue to try.  Current medicines are reviewed at length with the patient today.  The patient does not have concerns regarding medicines.  The following changes have been made:  no change  Labs/ tests ordered today include:  No orders of the defined types were placed in this encounter.     Disposition:   FU with me in 6 weeks   Signed, Joann Kulpa, Wonda Cheng, MD  05/05/2015 11:43 AM    [facility] [address], [place], [place]  [address] Phone: [phone]; Fax: [phone]

## 2015-05-12 ENCOUNTER — Other Ambulatory Visit: Payer: Medicare Other

## 2015-05-24 ENCOUNTER — Telehealth: Payer: Self-pay

## 2015-05-24 NOTE — Telephone Encounter (Signed)
Patient states [facility] requesting a list of all his medications. When reviewing these, Losartan is on his list of "taking" meds. It has not been refilled, ever, unless his PCP is giving it. Should he be taking it?

## 2015-05-24 NOTE — Telephone Encounter (Signed)
He should be taking Losartan 100 mg a day If he has not been on it, he could start with 1/2 tablet a day for the first week or so Please get BMP in 3-4 weeks after starting

## 2015-05-25 NOTE — Telephone Encounter (Signed)
Patient has verified that he is taking Losartan 1/2 of a 100mg  bid.

## 2015-05-25 NOTE — Telephone Encounter (Signed)
Continue same dose. Will address at next office visit

## 2015-06-01 ENCOUNTER — Other Ambulatory Visit: Payer: Self-pay

## 2015-06-01 MED ORDER — SPIRONOLACTONE 25 MG PO TABS: 12.5000 mg | ORAL_TABLET | Freq: Every day | ORAL | Status: DC

## 2015-06-01 NOTE — Telephone Encounter (Signed)
[name], MD at 05/05/2015 11:43 AM  Current medicines are reviewed at length with the patient today. The patient does not have concerns regarding medicines. The following changes have been made: no change   spironolactone (ALDACTONE) 25 MG tablet 45 tablet 3 05/05/2015    Take 0.5 tablets (12.5 mg total) by mouth daily. - Oral    90 day supply refill request received from [facility]

## 2015-11-01 ENCOUNTER — Encounter: Payer: Self-pay | Admitting: Cardiovascular Disease

## 2015-11-01 ENCOUNTER — Ambulatory Visit (INDEPENDENT_AMBULATORY_CARE_PROVIDER_SITE_OTHER): Payer: Medicare Other | Admitting: Cardiovascular Disease

## 2015-11-01 ENCOUNTER — Other Ambulatory Visit: Payer: Self-pay | Admitting: *Deleted

## 2015-11-01 ENCOUNTER — Encounter: Payer: Self-pay | Admitting: *Deleted

## 2015-11-01 VITALS — BP 132/70 | HR 76 | Ht 70.0 in | Wt 173.0 lb

## 2015-11-01 DIAGNOSIS — I5022 Chronic systolic (congestive) heart failure: Secondary | ICD-10-CM

## 2015-11-01 DIAGNOSIS — I10 Essential (primary) hypertension: Secondary | ICD-10-CM

## 2015-11-01 DIAGNOSIS — I447 Left bundle-branch block, unspecified: Secondary | ICD-10-CM

## 2015-11-01 MED ORDER — SPIRONOLACTONE 25 MG PO TABS: 25.0000 mg | ORAL_TABLET | Freq: Every day | ORAL | Status: DC

## 2015-11-01 NOTE — Patient Instructions (Signed)
Medication Instructions:  Increase spironolactone to 25mg  daily  Labwork: Your physician recommends that you return for lab work in: 1 week--BMET.   Testing/Procedures: Your physician has requested that you have a lexiscan myoview. For further information please visit [email or web address]. Please follow instruction sheet, as given.  Your physician has requested that you have an echocardiogram. Echocardiography is a painless test that uses sound waves to create images of your heart. It provides your doctor with information about the size and shape of your heart and how well your heart's chambers and valves are working. This procedure takes approximately one hour. There are no restrictions for this procedure. IN 6 WEEKS     Follow-Up: Your physician recommends that you schedule a follow-up appointment in: 3 months with Dr Jetty Duhamel will do an EKG at that time.         If you need a refill on your cardiac medications before your next appointment, please call your pharmacy.

## 2015-11-01 NOTE — Progress Notes (Signed)
Cardiology Office Note   Date:  11/01/2015   ID:  [name], DOB 1941/09/20, MRN [id number]  PCP:  Peter Ringer, MD  Cardiologist:   [name], MD   Chief Complaint  [name] presents with  . Follow-up   Problem list: 1. Essential hypertension-  2. History of stroke - April 2015 3. Acute on chronic congestive heart failure    History of Present Illness: [name] is a 75 y.o. male who presents for evaluation for recently found CHF He has noticed that he is fatigued recently.  He has a long history of hypertension. He has a history of mildly reduced left ventricle systolic function. He was recent seen by his pulmonary doctors or worsening shortness of breath. He was found have pneumonia. An echocardiogram revealed that he has severe left ventricular dysfunction with an ejection fraction of around 25%. He denies having any chest pain. He eats a low salt diet. His wife is a Microbiologist at [facility] [employer] and feeds him a very low-salt diet.  He denies any chest pain. He has shortness of breath with exertion. He occasionally has shortness of breath at rest. He denies any syncopal episodes.  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  January 04, 2015;  He is feeling much better. We've added Lasix and potassium to his medical regimen.  Breathing well.   May 05, 2015: Doing well. Minimal dyspnea Occasional episodes of orthostatic hypotension.  Overall doing well.   Feb. 6, 2017: Doing well. BP is well controlled.  Has had some chills,  Sneezing , coughing .  Has talked to Dr. Dagmar Hait about this   Does yardwork with out trouble   His EF is persistly low - EF 25-30% by echo in July.  - Left ventricle: Septal apical and inferior wall hypokinesis. Peter cavity size was severely dilated. Wall thickness was increased in a pattern of mild LVH. There was mild focal basal hypertrophy of Peter  septum. Systolic function was severely reduced. Peter estimated ejection fraction was in Peter range of 25% to 30%. Doppler parameters are consistent with abnormal left ventricular relaxation (grade 1 diastolic dysfunction). Doppler parameters are consistent with elevated ventricular end-diastolic filling pressure. - Aortic valve: Calcified left coronary cusp. - Left atrium: Peter atrium was mildly dilated. 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   EF at that time was 40 %  Past Medical History  Diagnosis Date  . Diabetes mellitus   . Hyperlipidemia   . Allergy   . Cataract   . Colon polyps 2012    Colonoscopy  . Diverticulosis 2012    Colonoscopy     Past Surgical History  Procedure Laterality Date  . Cataract extraction      both eyes  . Neck surgery    . Colon surgery      to repair intestines as child  . Knee arthroscopy      left  . Colonoscopy  10/20/2011    Procedure: COLONOSCOPY;  Surgeon: Inda Castle, MD;  Location: [facility];  Service: Endoscopy;  Laterality: N/A;  . Hot hemostasis  10/20/2011    Procedure: HOT HEMOSTASIS (ARGON PLASMA COAGULATION/BICAP);  Surgeon: Inda Castle, MD;  Location: [facility];  Service: Endoscopy;  Laterality: N/A;     Current Outpatient Prescriptions  Medication Sig Dispense Refill  . carvedilol (COREG) 12.5 MG tablet Take one tablet by mouth daily 62 tablet 11  . clopidogrel (PLAVIX) 75 MG tablet Take 1 tablet (75 mg total) by mouth daily with breakfast. 90 tablet 1  . furosemide (LASIX) 40 MG tablet Take 1 tablet (40 mg total) by mouth daily. 90 tablet 3  . glimepiride (AMARYL) 1 MG tablet Take 1 mg by mouth 2 (two) times daily.    Marland Kitchen losartan (COZAAR) 100 MG tablet Take 1 tablet (100 mg total) by mouth daily. 30 tablet 0  . metFORMIN (GLUCOPHAGE) 1000 MG tablet Take 1,000 mg by mouth 2 (two) times daily.     .  montelukast (SINGULAIR) 10 MG tablet Take 10 mg by mouth at bedtime.    . potassium chloride SA (K-DUR,KLOR-CON) 20 MEQ tablet Take 0.5 tablets (10 mEq total) by mouth daily. 90 tablet 3  . simvastatin (ZOCOR) 40 MG tablet Take 40 mg by mouth at bedtime.      Marland Kitchen spironolactone (ALDACTONE) 25 MG tablet Take 0.5 tablets (12.5 mg total) by mouth daily. 45 tablet 2  . Umeclidinium-Vilanterol (ANORO ELLIPTA) 62.5-25 MCG/INH AEPB Inhale 1 puff into Peter lungs daily.     No current facility-administered medications for this visit.    Allergies:   Review of [name]'s allergies indicates no known allergies.    Social History:  [name]  reports that he quit smoking about 20 months ago. His smoking use included Cigarettes. He has never used smokeless tobacco. He reports that he drinks alcohol. He reports that he does not use illicit drugs.   Family History:  [name]'s family history is negative for Colon cancer, Esophageal cancer, Stomach cancer, Anesthesia problems, Hypotension, Malignant hyperthermia, and Pseudochol deficiency.    ROS:  Please see Peter history of present illness.    Review of Systems: Constitutional:  denies fever, chills, diaphoresis, appetite change and fatigue.  HEENT: denies photophobia, eye pain, redness, hearing loss, ear pain, congestion, sore throat, rhinorrhea, sneezing, neck pain, neck stiffness and tinnitus.  Respiratory: admits to SOB, DOE,    Cardiovascular: denies chest pain, palpitations and leg swelling.  Gastrointestinal: denies nausea, vomiting, abdominal pain, diarrhea, constipation, blood in stool.  Genitourinary: denies dysuria, urgency, frequency, hematuria, flank pain and difficulty urinating.  Musculoskeletal: denies  myalgias, back pain, joint swelling, arthralgias and gait problem.   Skin: denies pallor, rash and wound.  Neurological: denies dizziness, seizures, syncope, weakness, light-headedness, numbness and headaches.   Hematological: denies  adenopathy, easy bruising, personal or family bleeding history.  Psychiatric/ Behavioral: denies suicidal ideation, mood changes, confusion, nervousness, sleep disturbance and agitation.       All other systems are reviewed and negative.    PHYSICAL EXAM: VS:  BP 132/70 mmHg  Pulse 76  Ht 5\' 10"  (1.778 m)  Wt 173 lb (78.472 kg)  BMI 24.82 kg/m2 , BMI Body mass index is 24.82 kg/(m^2). GEN: Well nourished, well developed, in no acute distress HEENT: normal Neck: no JVD, carotid bruits, or masses Cardiac: RRR; no murmurs, rubs, or gallops,no edema  Respiratory:  clear to auscultation bilaterally, normal work of breathing GI: soft, nontender, nondistended, + BS MS: no deformity or atrophy Skin: warm and dry, no rash Neuro:  Strength and sensation are intact Psych: normal   EKG:  EKG is ordered today.   NSR at 76 with LBBB.    Recent Labs: 02/09/2015: BUN 27*; Creatinine, Ser 1.50; Potassium 5.1; Sodium 137    Lipid Panel  Component Value Date/Time   CHOL 124 01/24/2014 0406   TRIG 101 01/24/2014 0406   HDL 39* 01/24/2014 0406   CHOLHDL 3.2 01/24/2014 0406   VLDL 20 01/24/2014 0406   LDLCALC 65 01/24/2014 0406      Wt Readings from Last 3 Encounters:  11/01/15 173 lb (78.472 kg)  05/05/15 166 lb 6.4 oz (75.479 kg)  01/04/15 166 lb 1.9 oz (75.352 kg)      Other studies Reviewed: Additional studies/ records that were reviewed today include: . Review of Peter above records demonstrates:    ASSESSMENT AND PLAN:  1. Essential hypertension-  BP is much better.   Will continue to gradually titreate up his meds as needed.   2. History of stroke - April 2015- he's not felt well since his stroke last year. He still has some episodes. He'll follow-up with his general medical doctor.  3. Acute on chronic congestive heart failure- [name] has a history of mild CHF by echocardiogram last year.   His EF is still low  He had a cardiac cathetererization  in 2011 which  revealed mild coronary artery irregularities.  . His ejection fraction was 40% at that time.   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    He has never had any angina  Will increase his Spirinolactone to 25, we will need a basic medical profile in one week. Will get a myoview to ensure that his recent decline of EF is not due to worsening CAD Assuming that Peter Myoview shows no ischemia, we will get an echocardiogram in 6 weeks and refer him to keep EP  if his ejection fraction is less than 35%. At this point I think that he'll need a biventricular pacer/ICD. Long discussion with [name] and his daughter regarding why this is necessary.  Peter following changes have been made:    Labs/ tests ordered today include:  No orders of Peter defined types were placed in this encounter.     Disposition:   FU with me in 3 months  Signed, Quitman Norberto, Wonda Cheng, MD  11/01/2015 9:42 AM    [facility] [address], [place], [place]  [address] Phone: [phone]; Fax: [phone]

## 2015-11-03 ENCOUNTER — Other Ambulatory Visit: Payer: Self-pay

## 2015-11-03 DIAGNOSIS — I10 Essential (primary) hypertension: Secondary | ICD-10-CM

## 2015-11-03 DIAGNOSIS — I5022 Chronic systolic (congestive) heart failure: Secondary | ICD-10-CM

## 2015-11-03 MED ORDER — CARVEDILOL 12.5 MG PO TABS: ORAL_TABLET | ORAL | Status: DC

## 2015-11-08 ENCOUNTER — Other Ambulatory Visit (INDEPENDENT_AMBULATORY_CARE_PROVIDER_SITE_OTHER): Payer: Medicare Other | Admitting: *Deleted

## 2015-11-08 DIAGNOSIS — I447 Left bundle-branch block, unspecified: Secondary | ICD-10-CM | POA: Diagnosis not present

## 2015-11-08 DIAGNOSIS — I5022 Chronic systolic (congestive) heart failure: Secondary | ICD-10-CM | POA: Diagnosis not present

## 2015-11-08 LAB — BASIC METABOLIC PANEL
BUN: 31 mg/dL — ABNORMAL HIGH (ref 7–25)
CO2: 21 mmol/L (ref 20–31)
Calcium: 9.3 mg/dL (ref 8.6–10.3)
Chloride: 109 mmol/L (ref 98–110)
Creat: 1.71 mg/dL — ABNORMAL HIGH (ref 0.70–1.18)
Glucose, Bld: 135 mg/dL — ABNORMAL HIGH (ref 65–99)
Potassium: 5 mmol/L (ref 3.5–5.3)
Sodium: 140 mmol/L (ref 135–146)

## 2015-11-08 NOTE — Addendum Note (Signed)
Addended by: Eulis Foster on: 11/08/2015 09:01 AM   Modules accepted: Orders

## 2015-11-10 ENCOUNTER — Telehealth (HOSPITAL_COMMUNITY): Payer: Self-pay | Admitting: *Deleted

## 2015-11-10 NOTE — Telephone Encounter (Signed)
Patient given detailed instructions per Myocardial Perfusion Study Information Sheet for the test on 11/12/15 at [facility]. Patient notified to arrive 15 minutes early and that it is imperative to arrive on time for appointment to keep from having the test rescheduled.  If you need to cancel or reschedule your appointment, please call the office within 24 hours of your appointment. Failure to do so may result in a cancellation of your appointment, and a $50 no show fee. Patient verbalized understanding.Trigg Delarocha, Ranae Palms

## 2015-11-12 ENCOUNTER — Ambulatory Visit (HOSPITAL_COMMUNITY): Payer: Medicare Other | Attending: Cardiology

## 2015-11-12 DIAGNOSIS — I447 Left bundle-branch block, unspecified: Secondary | ICD-10-CM | POA: Diagnosis not present

## 2015-11-12 DIAGNOSIS — R9439 Abnormal result of other cardiovascular function study: Secondary | ICD-10-CM | POA: Diagnosis not present

## 2015-11-12 DIAGNOSIS — I110 Hypertensive heart disease with heart failure: Secondary | ICD-10-CM | POA: Insufficient documentation

## 2015-11-12 DIAGNOSIS — R0609 Other forms of dyspnea: Secondary | ICD-10-CM | POA: Diagnosis not present

## 2015-11-12 DIAGNOSIS — I5022 Chronic systolic (congestive) heart failure: Secondary | ICD-10-CM

## 2015-11-12 DIAGNOSIS — R5383 Other fatigue: Secondary | ICD-10-CM | POA: Diagnosis not present

## 2015-11-12 LAB — MYOCARDIAL PERFUSION IMAGING
Estimated workload: 1 METS
LV dias vol: 164 mL
LV sys vol: 108 mL
Peak HR: 101 {beats}/min
Percent of predicted max HR: 68 %
RATE: 0.25
Rest HR: 73 {beats}/min
SDS: 3
SRS: 5
SSS: 8
Stage 1 DBP: 77 mmHg
Stage 1 Grade: 0 %
Stage 1 HR: 68 {beats}/min
Stage 1 SBP: 154 mmHg
Stage 1 Speed: 0 mph
Stage 2 Grade: 0 %
Stage 2 HR: 68 {beats}/min
Stage 2 Speed: 0 mph
Stage 3 DBP: 89 mmHg
Stage 3 Grade: 0 %
Stage 3 HR: 85 {beats}/min
Stage 3 SBP: 136 mmHg
Stage 3 Speed: 0 mph
Stage 4 Grade: 0 %
Stage 4 HR: 101 {beats}/min
Stage 4 Speed: 0 mph
Stage 5 DBP: 76 mmHg
Stage 5 Grade: 0 %
Stage 5 HR: 96 {beats}/min
Stage 5 SBP: 138 mmHg
Stage 5 Speed: 0 mph
Stage 6 DBP: 71 mmHg
Stage 6 Grade: 0 %
Stage 6 HR: 90 {beats}/min
Stage 6 SBP: 148 mmHg
Stage 6 Speed: 0 mph
TID: 1.22

## 2015-11-12 MED ORDER — TECHNETIUM TC 99M SESTAMIBI GENERIC - CARDIOLITE
10.2000 | Freq: Once | INTRAVENOUS | Status: AC | PRN
  Administered 2015-11-12: 10 via INTRAVENOUS

## 2015-11-12 MED ORDER — REGADENOSON 0.4 MG/5ML IV SOLN
0.4000 mg | Freq: Once | INTRAVENOUS | Status: AC
  Administered 2015-11-12: 0.4 mg via INTRAVENOUS

## 2015-11-12 MED ORDER — TECHNETIUM TC 99M SESTAMIBI GENERIC - CARDIOLITE
32.4000 | Freq: Once | INTRAVENOUS | Status: AC | PRN
  Administered 2015-11-12: 32 via INTRAVENOUS

## 2015-11-15 ENCOUNTER — Telehealth: Payer: Self-pay | Admitting: *Deleted

## 2015-11-15 ENCOUNTER — Telehealth: Payer: Self-pay | Admitting: Cardiovascular Disease

## 2015-11-15 NOTE — Telephone Encounter (Signed)
Creatinine is up a little but not enough to warrant stopping the medication     Continue meds.     We will continue to follow closely        ----- Message -----     From: Richmond Campbell, LPN     Sent: [id number] 10:32 AM      To: [name], MD    PT'S DAUGHTER  AWARE .Adonis Housekeeper

## 2015-11-15 NOTE — Telephone Encounter (Signed)
She would like his stress test and lab results from last week please.

## 2015-11-15 NOTE — Telephone Encounter (Signed)
PT'S DAUGHTER AWARE OF MYOVIEW RESULTS .Adonis Housekeeper

## 2015-12-13 ENCOUNTER — Other Ambulatory Visit: Payer: Self-pay

## 2015-12-13 ENCOUNTER — Ambulatory Visit (HOSPITAL_COMMUNITY): Payer: Medicare Other | Attending: Cardiology

## 2015-12-13 DIAGNOSIS — I110 Hypertensive heart disease with heart failure: Secondary | ICD-10-CM | POA: Insufficient documentation

## 2015-12-13 DIAGNOSIS — I5022 Chronic systolic (congestive) heart failure: Secondary | ICD-10-CM | POA: Insufficient documentation

## 2015-12-13 DIAGNOSIS — I059 Rheumatic mitral valve disease, unspecified: Secondary | ICD-10-CM | POA: Insufficient documentation

## 2015-12-13 DIAGNOSIS — E785 Hyperlipidemia, unspecified: Secondary | ICD-10-CM | POA: Insufficient documentation

## 2015-12-13 DIAGNOSIS — I253 Aneurysm of heart: Secondary | ICD-10-CM | POA: Diagnosis not present

## 2015-12-13 DIAGNOSIS — I447 Left bundle-branch block, unspecified: Secondary | ICD-10-CM | POA: Insufficient documentation

## 2015-12-13 DIAGNOSIS — E119 Type 2 diabetes mellitus without complications: Secondary | ICD-10-CM | POA: Insufficient documentation

## 2015-12-13 DIAGNOSIS — I509 Heart failure, unspecified: Secondary | ICD-10-CM | POA: Diagnosis present

## 2016-02-07 ENCOUNTER — Encounter: Payer: Self-pay | Admitting: Cardiovascular Disease

## 2016-02-07 ENCOUNTER — Ambulatory Visit (INDEPENDENT_AMBULATORY_CARE_PROVIDER_SITE_OTHER): Payer: Medicare Other | Admitting: Cardiovascular Disease

## 2016-02-07 VITALS — BP 110/60 | HR 75 | Ht 70.0 in | Wt 163.0 lb

## 2016-02-07 DIAGNOSIS — I5022 Chronic systolic (congestive) heart failure: Secondary | ICD-10-CM | POA: Diagnosis not present

## 2016-02-07 DIAGNOSIS — Z13220 Encounter for screening for lipoid disorders: Secondary | ICD-10-CM | POA: Diagnosis not present

## 2016-02-07 LAB — COMPREHENSIVE METABOLIC PANEL
ALT: 14 U/L (ref 9–46)
AST: 17 U/L (ref 10–35)
Albumin: 3.9 g/dL (ref 3.6–5.1)
Alkaline Phosphatase: 65 U/L (ref 40–115)
BUN: 37 mg/dL — ABNORMAL HIGH (ref 7–25)
CO2: 20 mmol/L (ref 20–31)
Calcium: 9.2 mg/dL (ref 8.6–10.3)
Chloride: 109 mmol/L (ref 98–110)
Creat: 1.97 mg/dL — ABNORMAL HIGH (ref 0.70–1.18)
Glucose, Bld: 131 mg/dL — ABNORMAL HIGH (ref 65–99)
Potassium: 5.5 mmol/L — ABNORMAL HIGH (ref 3.5–5.3)
Sodium: 140 mmol/L (ref 135–146)
Total Bilirubin: 0.4 mg/dL (ref 0.2–1.2)
Total Protein: 6.5 g/dL (ref 6.1–8.1)

## 2016-02-07 LAB — LIPID PANEL
Cholesterol: 109 mg/dL — ABNORMAL LOW (ref 125–200)
HDL: 29 mg/dL — ABNORMAL LOW (ref >=40)
LDL Cholesterol: 57 mg/dL (ref <130)
Total CHOL/HDL Ratio: 3.8 Ratio (ref <=5.0)
Triglycerides: 117 mg/dL (ref <150)
VLDL: 23 mg/dL (ref <30)

## 2016-02-07 NOTE — Patient Instructions (Signed)

## 2016-02-07 NOTE — Progress Notes (Signed)
Cardiology Office Note   Date:  02/07/2016   ID:  [name], DOB 1941-06-28, MRN [id number]  PCP:  Tivis Ringer, MD  Cardiologist:   Mertie Moores, MD   Chief Complaint  Patient presents with  . Congestive Heart Failure   Problem list: 1. Essential hypertension-  2. History of stroke - April 2015 3. Acute on chronic congestive heart failure 4. hyperlipidemia   History of Present Illness: [name] is a 75 y.o. male who presents for evaluation for recently found CHF He has noticed that he is fatigued recently.  He has a long history of hypertension. He has a history of mildly reduced left ventricle systolic function. He was recent seen by his pulmonary doctors or worsening shortness of breath. He was found have pneumonia. An echocardiogram revealed that he has severe left ventricular dysfunction with an ejection fraction of around 25%. He denies having any chest pain. He eats a low salt diet. His wife is a Microbiologist at [employer] and feeds him a very low-salt diet.  He denies any chest pain. He has shortness of breath with exertion. He occasionally has shortness of breath at rest. He denies any syncopal episodes.  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  January 04, 2015;  He is feeling much better. We've added Lasix and potassium to his medical regimen.  Breathing well.   May 05, 2015: Doing well. Minimal dyspnea Occasional episodes of orthostatic hypotension.  Overall doing well.   Feb. 6, 2017: Doing well. BP is well controlled.  Has had some chills,  Sneezing , coughing .  Has talked to Dr. Dagmar Hait about this   Does yardwork with out trouble   His EF is persistly low - EF 25-30% by echo in July.  - Left ventricle: Septal apical and inferior wall hypokinesis. The cavity size was severely dilated. Wall thickness was increased in a pattern of mild LVH. There was mild  focal basal hypertrophy of the septum. Systolic function was severely reduced. The estimated ejection fraction was in the range of 25% to 30%. Doppler parameters are consistent with abnormal left ventricular relaxation (grade 1 diastolic dysfunction). Doppler parameters are consistent with elevated ventricular end-diastolic filling pressure. - Aortic valve: Calcified left coronary cusp. - Left atrium: The atrium was mildly dilated. 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   EF at that time was 40 %  Feb 07, 2016:  Doing ok Echo December 13 2015 showed slight improvement in his LV function - EF 35-40%.  Still active, does yard work without any problems.  Still smoking some .   Some DOE with strenuous exertion   Past Medical History  Diagnosis Date  . Diabetes mellitus   . Hyperlipidemia   . Allergy   . Cataract   . Colon polyps 2012    Colonoscopy  . Diverticulosis 2012    Colonoscopy     Past Surgical History  Procedure Laterality Date  . Cataract extraction      both eyes  . Neck surgery    . Colon surgery      to repair intestines as child  . Knee arthroscopy      left  . Colonoscopy  10/20/2011    Procedure: COLONOSCOPY;  Surgeon: Inda Castle, MD;  Location: [facility];  Service: Endoscopy;  Laterality: N/A;  . Hot hemostasis  10/20/2011    Procedure: HOT HEMOSTASIS (ARGON PLASMA COAGULATION/BICAP);  Surgeon: Inda Castle, MD;  Location: [facility];  Service: Endoscopy;  Laterality: N/A;     Current Outpatient Prescriptions  Medication Sig Dispense Refill  . carvedilol (COREG) 12.5 MG tablet Take one tablet by mouth daily 90 tablet 3  . clopidogrel (PLAVIX) 75 MG tablet Take 1 tablet (75 mg total) by mouth daily with breakfast. 90 tablet 1  . furosemide (LASIX) 40 MG tablet Take 1 tablet (40 mg total) by mouth daily. 90 tablet 3  . glimepiride  (AMARYL) 1 MG tablet Take 1 mg by mouth 2 (two) times daily.    Marland Kitchen losartan (COZAAR) 100 MG tablet Take 1 tablet (100 mg total) by mouth daily. 30 tablet 0  . metFORMIN (GLUCOPHAGE) 1000 MG tablet Take 1,000 mg by mouth 2 (two) times daily.     . montelukast (SINGULAIR) 10 MG tablet Take 10 mg by mouth at bedtime.    . potassium chloride SA (K-DUR,KLOR-CON) 20 MEQ tablet Take 0.5 tablets (10 mEq total) by mouth daily. 90 tablet 3  . simvastatin (ZOCOR) 40 MG tablet Take 40 mg by mouth at bedtime.      Marland Kitchen spironolactone (ALDACTONE) 25 MG tablet Take 1 tablet (25 mg total) by mouth daily. 90 tablet 1   No current facility-administered medications for this visit.   Allergies:   Review of patient's allergies indicates no known allergies.   Social History:  The patient  reports that he quit smoking about 1 years ago. His smoking use included Cigarettes. He has never used smokeless tobacco. He reports that he drinks alcohol. He reports that he does not use illicit drugs.   Family History:  The patient's family history is negative for Colon cancer, Esophageal cancer, Stomach cancer, Anesthesia problems, Hypotension, Malignant hyperthermia, and Pseudochol deficiency.    ROS:  Please see the history of present illness.   Review of Systems: Constitutional:  denies fever, chills, diaphoresis, appetite change and fatigue.  HEENT: denies photophobia, eye pain, redness, hearing loss, ear pain, congestion, sore throat, rhinorrhea, sneezing, neck pain, neck stiffness and tinnitus.  Respiratory: admits to SOB, DOE,    Cardiovascular: denies chest pain, palpitations and leg swelling.  Gastrointestinal: denies nausea, vomiting, abdominal pain, diarrhea, constipation, blood in stool.  Genitourinary: denies dysuria, urgency, frequency, hematuria, flank pain and difficulty urinating.  Musculoskeletal: denies  myalgias, back pain, joint swelling, arthralgias and gait problem.   Skin: denies pallor, rash and wound.   Neurological: denies dizziness, seizures, syncope, weakness, light-headedness, numbness and headaches.   Hematological: denies adenopathy, easy bruising, personal or family bleeding history.  Psychiatric/ Behavioral: denies suicidal ideation, mood changes, confusion, nervousness, sleep disturbance and agitation.      All other systems are reviewed and negative.    PHYSICAL EXAM: VS:  BP 110/60 mmHg  Pulse 75  Ht '5\' 10"'  (1.778 m)  Wt 163 lb (73.936 kg)  BMI 23.39 kg/m2 , BMI Body mass index is 23.39 kg/(m^2). GEN: Well nourished, well developed, in no acute distress HEENT: normal Neck: no JVD, carotid bruits, or masses Cardiac: RRR; no murmurs, rubs, or gallops,no edema  Respiratory:  clear to auscultation bilaterally, normal work of breathing GI: soft, nontender, nondistended, + BS MS: no deformity or atrophy Skin: warm and dry, no rash Neuro:  Strength and sensation are intact Psych: normal   EKG:  EKG is ordered today.   NSR at 75  with LBBB.   Recent Labs: 11/08/2015: BUN 31*; Creat 1.71*; Potassium 5.0; Sodium 140    Lipid Panel    Component Value Date/Time   CHOL 124 01/24/2014 0406   TRIG 101 01/24/2014 0406   HDL 39* 01/24/2014 0406   CHOLHDL 3.2 01/24/2014 0406   VLDL 20 01/24/2014 0406   LDLCALC 65 01/24/2014 0406    Wt Readings from Last 3 Encounters:  02/07/16 163 lb (73.936 kg)  11/01/15 173 lb (78.472 kg)  05/05/15 166 lb 6.4 oz (75.479 kg)     Other studies Reviewed: Additional studies/ records that were reviewed today include: . Review of the above records demonstrates:   ASSESSMENT AND PLAN:  1. Essential hypertension-  BP is much better.   Continue same meds    2. History of stroke - April 2015- he's not felt well since his stroke last year. He still has some episodes. He'll follow-up with his general medical doctor.  3. Acute on chronic congestive heart failure- Mr. Winkels has a history of mild CHF by echocardiogram last year.   His EF is  still low  He had a cardiac cathetererization  in 2011 which revealed mild coronary artery irregularities.  . His ejection fraction was 40% at that time.   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    He has never had any angina   on Spirinolactone to 25 a day , lasix 40 a day , losartan 100 a day , coreg 12.5 BID    4. Hyperlipidemia:   Currently on Simvastatin 40 at night. Will check CMET and lipid profile.  Labs/ tests ordered today include:   Orders Placed This Encounter  Procedures  . Comp Met (CMET)  . Lipid Profile  . EKG 12-Lead    Disposition:   FU with me in 6 months   Mertie Moores, MD  02/07/2016 8:16 AM    [facility] [address], [place], [place]  [address] Phone: [phone]; Fax: [phone]

## 2016-02-09 ENCOUNTER — Other Ambulatory Visit: Payer: Self-pay

## 2016-02-09 DIAGNOSIS — I10 Essential (primary) hypertension: Secondary | ICD-10-CM

## 2016-02-16 ENCOUNTER — Telehealth: Payer: Self-pay | Admitting: Nurse Practitioner

## 2016-02-16 ENCOUNTER — Other Ambulatory Visit (INDEPENDENT_AMBULATORY_CARE_PROVIDER_SITE_OTHER): Payer: Medicare Other | Admitting: *Deleted

## 2016-02-16 DIAGNOSIS — I447 Left bundle-branch block, unspecified: Secondary | ICD-10-CM

## 2016-02-16 DIAGNOSIS — I5022 Chronic systolic (congestive) heart failure: Secondary | ICD-10-CM

## 2016-02-16 LAB — BASIC METABOLIC PANEL
BUN: 30 mg/dL — ABNORMAL HIGH (ref 7–25)
CO2: 20 mmol/L (ref 20–31)
Calcium: 8.6 mg/dL (ref 8.6–10.3)
Chloride: 111 mmol/L — ABNORMAL HIGH (ref 98–110)
Creat: 1.72 mg/dL — ABNORMAL HIGH (ref 0.70–1.18)
Glucose, Bld: 133 mg/dL — ABNORMAL HIGH (ref 65–99)
Potassium: 5.6 mmol/L — ABNORMAL HIGH (ref 3.5–5.3)
Sodium: 140 mmol/L (ref 135–146)

## 2016-02-16 MED ORDER — SPIRONOLACTONE 25 MG PO TABS: 12.5000 mg | ORAL_TABLET | Freq: Every day | ORAL | Status: DC

## 2016-02-16 NOTE — Addendum Note (Signed)
Addended by: Eulis Foster on: 02/16/2016 07:59 AM   Modules accepted: Orders

## 2016-02-16 NOTE — Telephone Encounter (Signed)
Spoke with patient to review today's repeat bmet.  I advised patient that per Dr. Acie Fredrickson he is to decrease spironolactone to 12.5 mg daily and repeat bmet in 2 weeks.  He states he decreased the amount of dietary potassium he was consuming several weeks ago.  He is scheduled for repeat bmet on Wed. June 7.  He thanked me for the call.

## 2016-02-16 NOTE — Addendum Note (Signed)
Addended by: Eulis Foster on: 02/16/2016 08:00 AM   Modules accepted: Orders

## 2016-02-27 ENCOUNTER — Inpatient Hospital Stay (HOSPITAL_COMMUNITY)
Admission: EM | Admit: 2016-02-27 | Discharge: 2016-02-29 | DRG: 065 | Disposition: A | Discharge status: Home / Self Care | Payer: Medicare Other | Attending: Internal Medicine | Admitting: Internal Medicine

## 2016-02-27 ENCOUNTER — Encounter (HOSPITAL_COMMUNITY): Payer: Self-pay | Admitting: Emergency Medicine

## 2016-02-27 ENCOUNTER — Inpatient Hospital Stay (HOSPITAL_COMMUNITY): Payer: Medicare Other

## 2016-02-27 ENCOUNTER — Emergency Department (HOSPITAL_COMMUNITY): Payer: Medicare Other

## 2016-02-27 DIAGNOSIS — I10 Essential (primary) hypertension: Secondary | ICD-10-CM | POA: Diagnosis not present

## 2016-02-27 DIAGNOSIS — Z7984 Long term (current) use of oral hypoglycemic drugs: Secondary | ICD-10-CM | POA: Diagnosis not present

## 2016-02-27 DIAGNOSIS — E1149 Type 2 diabetes mellitus with other diabetic neurological complication: Secondary | ICD-10-CM | POA: Diagnosis not present

## 2016-02-27 DIAGNOSIS — G8191 Hemiplegia, unspecified affecting right dominant side: Secondary | ICD-10-CM | POA: Diagnosis present

## 2016-02-27 DIAGNOSIS — I639 Cerebral infarction, unspecified: Principal | ICD-10-CM | POA: Diagnosis present

## 2016-02-27 DIAGNOSIS — Z79899 Other long term (current) drug therapy: Secondary | ICD-10-CM

## 2016-02-27 DIAGNOSIS — Z8673 Personal history of transient ischemic attack (TIA), and cerebral infarction without residual deficits: Secondary | ICD-10-CM

## 2016-02-27 DIAGNOSIS — Z833 Family history of diabetes mellitus: Secondary | ICD-10-CM | POA: Diagnosis not present

## 2016-02-27 DIAGNOSIS — E1129 Type 2 diabetes mellitus with other diabetic kidney complication: Secondary | ICD-10-CM

## 2016-02-27 DIAGNOSIS — E119 Type 2 diabetes mellitus without complications: Secondary | ICD-10-CM | POA: Diagnosis present

## 2016-02-27 DIAGNOSIS — D509 Iron deficiency anemia, unspecified: Secondary | ICD-10-CM | POA: Diagnosis present

## 2016-02-27 DIAGNOSIS — I6789 Other cerebrovascular disease: Secondary | ICD-10-CM | POA: Diagnosis not present

## 2016-02-27 DIAGNOSIS — R062 Wheezing: Secondary | ICD-10-CM

## 2016-02-27 DIAGNOSIS — I2510 Atherosclerotic heart disease of native coronary artery without angina pectoris: Secondary | ICD-10-CM | POA: Diagnosis present

## 2016-02-27 DIAGNOSIS — I5022 Chronic systolic (congestive) heart failure: Secondary | ICD-10-CM | POA: Diagnosis present

## 2016-02-27 DIAGNOSIS — Z7902 Long term (current) use of antithrombotics/antiplatelets: Secondary | ICD-10-CM

## 2016-02-27 DIAGNOSIS — I6350 Cerebral infarction due to unspecified occlusion or stenosis of unspecified cerebral artery: Secondary | ICD-10-CM | POA: Diagnosis not present

## 2016-02-27 DIAGNOSIS — D649 Anemia, unspecified: Secondary | ICD-10-CM | POA: Diagnosis present

## 2016-02-27 DIAGNOSIS — Z87891 Personal history of nicotine dependence: Secondary | ICD-10-CM

## 2016-02-27 DIAGNOSIS — F17200 Nicotine dependence, unspecified, uncomplicated: Secondary | ICD-10-CM | POA: Diagnosis present

## 2016-02-27 DIAGNOSIS — E785 Hyperlipidemia, unspecified: Secondary | ICD-10-CM | POA: Diagnosis present

## 2016-02-27 DIAGNOSIS — I110 Hypertensive heart disease with heart failure: Secondary | ICD-10-CM | POA: Diagnosis present

## 2016-02-27 LAB — URINALYSIS, ROUTINE W REFLEX MICROSCOPIC
Bilirubin Urine: NEGATIVE
Glucose, UA: NEGATIVE mg/dL
Hgb urine dipstick: NEGATIVE
Ketones, ur: NEGATIVE mg/dL
Leukocytes, UA: NEGATIVE
Nitrite: NEGATIVE
Protein, ur: NEGATIVE mg/dL
Specific Gravity, Urine: 1.019 (ref 1.005–1.030)
pH: 5.5 (ref 5.0–8.0)

## 2016-02-27 LAB — CBC WITH DIFFERENTIAL/PLATELET
Basophils Absolute: 0 10*3/uL (ref 0.0–0.1)
Basophils Relative: 0 %
Eosinophils Absolute: 0.1 10*3/uL (ref 0.0–0.7)
Eosinophils Relative: 2 %
HCT: 34.7 % — ABNORMAL LOW (ref 39.0–52.0)
Hemoglobin: 11.2 g/dL — ABNORMAL LOW (ref 13.0–17.0)
Lymphocytes Relative: 28 %
Lymphs Abs: 1.7 10*3/uL (ref 0.7–4.0)
MCH: 29.2 pg (ref 26.0–34.0)
MCHC: 32.3 g/dL (ref 30.0–36.0)
MCV: 90.6 fL (ref 78.0–100.0)
Monocytes Absolute: 0.4 10*3/uL (ref 0.1–1.0)
Monocytes Relative: 7 %
Neutro Abs: 3.9 10*3/uL (ref 1.7–7.7)
Neutrophils Relative %: 63 %
Platelets: 228 10*3/uL (ref 150–400)
RBC: 3.83 MIL/uL — ABNORMAL LOW (ref 4.22–5.81)
RDW: 14.2 % (ref 11.5–15.5)
WBC: 6.1 10*3/uL (ref 4.0–10.5)

## 2016-02-27 LAB — COMPREHENSIVE METABOLIC PANEL
ALT: 15 U/L — ABNORMAL LOW (ref 17–63)
AST: 19 U/L (ref 15–41)
Albumin: 3.1 g/dL — ABNORMAL LOW (ref 3.5–5.0)
Alkaline Phosphatase: 62 U/L (ref 38–126)
Anion gap: 6 (ref 5–15)
BUN: 25 mg/dL — ABNORMAL HIGH (ref 6–20)
CO2: 24 mmol/L (ref 22–32)
Calcium: 9.2 mg/dL (ref 8.9–10.3)
Chloride: 109 mmol/L (ref 101–111)
Creatinine, Ser: 1.43 mg/dL — ABNORMAL HIGH (ref 0.61–1.24)
GFR calc Af Amer: 55 mL/min — ABNORMAL LOW (ref >60)
GFR calc non Af Amer: 47 mL/min — ABNORMAL LOW (ref >60)
Glucose, Bld: 117 mg/dL — ABNORMAL HIGH (ref 65–99)
Potassium: 4.8 mmol/L (ref 3.5–5.1)
Sodium: 139 mmol/L (ref 135–145)
Total Bilirubin: 0.5 mg/dL (ref 0.3–1.2)
Total Protein: 6.1 g/dL — ABNORMAL LOW (ref 6.5–8.1)

## 2016-02-27 LAB — RAPID URINE DRUG SCREEN, HOSP PERFORMED
Amphetamines: NOT DETECTED
Barbiturates: NOT DETECTED
Benzodiazepines: NOT DETECTED
Cocaine: NOT DETECTED
Opiates: NOT DETECTED
Tetrahydrocannabinol: NOT DETECTED

## 2016-02-27 LAB — TROPONIN I: Troponin I: <0.03 ng/mL (ref <0.031)

## 2016-02-27 LAB — PROTIME-INR
INR: 1.05 (ref 0.00–1.49)
Prothrombin Time: 13.9 seconds (ref 11.6–15.2)

## 2016-02-27 LAB — GLUCOSE, CAPILLARY: Glucose-Capillary: 193 mg/dL — ABNORMAL HIGH (ref 65–99)

## 2016-02-27 LAB — APTT: aPTT: 36 seconds (ref 24–37)

## 2016-02-27 MED ORDER — INSULIN ASPART 100 UNIT/ML ~~LOC~~ SOLN: 0.0000 [IU] | Freq: Every day | SUBCUTANEOUS | Status: DC

## 2016-02-27 MED ORDER — IPRATROPIUM-ALBUTEROL 0.5-2.5 (3) MG/3ML IN SOLN: 3.0000 mL | Freq: Four times a day (QID) | RESPIRATORY_TRACT | Status: DC | PRN

## 2016-02-27 MED ORDER — STROKE: EARLY STAGES OF RECOVERY BOOK
Freq: Once | Status: AC
Start: 2016-02-27 — End: 2016-02-28
  Administered 2016-02-28: 06:00:00
  Filled 2016-02-27: qty 1

## 2016-02-27 MED ORDER — INSULIN ASPART 100 UNIT/ML ~~LOC~~ SOLN
0.0000 [IU] | Freq: Three times a day (TID) | SUBCUTANEOUS | Status: DC
  Administered 2016-02-28 – 2016-02-29 (×3): 2 [IU] via SUBCUTANEOUS
  Administered 2016-02-29: 5 [IU] via SUBCUTANEOUS

## 2016-02-27 MED ORDER — SODIUM CHLORIDE 0.9 % IV SOLN
100.0000 mL/h | INTRAVENOUS | Status: DC
  Administered 2016-02-27 – 2016-02-28 (×2): 100 mL/h via INTRAVENOUS

## 2016-02-27 MED ORDER — SODIUM CHLORIDE 0.9% FLUSH
3.0000 mL | Freq: Two times a day (BID) | INTRAVENOUS | Status: DC
  Administered 2016-02-28: 3 mL via INTRAVENOUS

## 2016-02-27 MED ORDER — SODIUM CHLORIDE 0.9 % IV BOLUS (SEPSIS)
500.0000 mL | Freq: Once | INTRAVENOUS | Status: AC
  Administered 2016-02-27: 500 mL via INTRAVENOUS

## 2016-02-27 MED ORDER — ENOXAPARIN SODIUM 40 MG/0.4ML ~~LOC~~ SOLN
40.0000 mg | Freq: Every day | SUBCUTANEOUS | Status: DC
Start: 2016-02-28 — End: 2016-02-29
  Administered 2016-02-28 (×2): 40 mg via SUBCUTANEOUS
  Filled 2016-02-27 (×2): qty 0.4

## 2016-02-27 NOTE — ED Notes (Signed)
Admitting at bedside.  Requesting pt to chest xray before going to room on 19M.  Aaron Edelman, RN to accompany to xray and then to floor.

## 2016-02-27 NOTE — ED Notes (Signed)
Patient complains of extremity weakness since 1000 this morning.  States he feels similar to how he felt one year ago when he had a previous stroke.  Patient alert and oriented at this time.

## 2016-02-27 NOTE — ED Notes (Signed)
Pt provided with [place] Sandwich and Diet Coke.

## 2016-02-27 NOTE — ED Notes (Signed)
Pt taken to room by nurse first for evaluation by EDP. No code stroke called at this time.

## 2016-02-27 NOTE — ED Provider Notes (Signed)
CSN: [id number]     Arrival date & time 02/27/16  1717 History   First MD Initiated Contact with Patient 02/27/16 1725     No chief complaint on file.    (Consider location/radiation/quality/duration/timing/severity/associated sxs/prior Treatment) HPI Patient presents with concern of right-sided numbness. Symptoms began 7.5 hours prior to my evaluation. precipitant. Since onset symptoms of been persistent, with no new right headedness, syncope, chest pain, dyspnea, weakness in any extremity, falling. No recent medication changes, diet changes. Patient has history of prior stroke with family states that he has been compliant with all medication.  Past Medical History  Diagnosis Date  . Diabetes mellitus   . Hyperlipidemia   . Allergy   . Cataract   . Colon polyps 2012    Colonoscopy  . Diverticulosis 2012    Colonoscopy    Past Surgical History  Procedure Laterality Date  . Cataract extraction      both eyes  . Neck surgery    . Colon surgery      to repair intestines as child  . Knee arthroscopy      left  . Colonoscopy  10/20/2011    Procedure: COLONOSCOPY;  Surgeon: Inda Castle, MD;  Location: [facility];  Service: Endoscopy;  Laterality: N/A;  . Hot hemostasis  10/20/2011    Procedure: HOT HEMOSTASIS (ARGON PLASMA COAGULATION/BICAP);  Surgeon: Inda Castle, MD;  Location: [facility];  Service: Endoscopy;  Laterality: N/A;   Family History  Problem Relation Age of Onset  . Colon cancer Neg Hx   . Esophageal cancer Neg Hx   . Stomach cancer Neg Hx   . Anesthesia problems Neg Hx   . Hypotension Neg Hx   . Malignant hyperthermia Neg Hx   . Pseudochol deficiency Neg Hx    Social History  Substance Use Topics  . Smoking status: Former Smoker    Types: Cigarettes    Quit date: 02/23/2014  . Smokeless tobacco: Never Used     Comment: tobacco info given 11/07/2011  . Alcohol Use: Yes     Comment: rarely    Review of Systems  Constitutional:       Per  HPI, otherwise negative  HENT:       Per HPI, otherwise negative  Respiratory:       Per HPI, otherwise negative  Cardiovascular:       Per HPI, otherwise negative  Gastrointestinal: Negative for vomiting.  Endocrine:       Negative aside from HPI  Genitourinary:       Neg aside from HPI   Musculoskeletal:       Per HPI, otherwise negative  Skin: Negative.   Neurological: Positive for numbness. Negative for syncope.      Allergies  Review of patient's allergies indicates no known allergies.  Home Medications   Prior to Admission medications   Medication Sig Start Date End Date Taking? Authorizing Provider  carvedilol (COREG) 12.5 MG tablet Take one tablet by mouth daily 11/03/15   [name], MD  clopidogrel (PLAVIX) 75 MG tablet Take 1 tablet (75 mg total) by mouth daily with breakfast. 04/05/15   Rosalin Hawking, MD  furosemide (LASIX) 40 MG tablet Take 1 tablet (40 mg total) by mouth daily. 12/16/14   [name], MD  glimepiride (AMARYL) 1 MG tablet Take 1 mg by mouth 2 (two) times daily.    Historical Provider, MD  losartan (COZAAR) 100 MG tablet Take 1 tablet (100 mg total) by  mouth daily. 01/26/14   Sheila Oats, MD  metFORMIN (GLUCOPHAGE) 1000 MG tablet Take 1,000 mg by mouth 2 (two) times daily.  10/11/15   Historical Provider, MD  montelukast (SINGULAIR) 10 MG tablet Take 10 mg by mouth at bedtime.    Historical Provider, MD  potassium chloride SA (K-DUR,KLOR-CON) 20 MEQ tablet Take 0.5 tablets (10 mEq total) by mouth daily. 01/06/15   [name], MD  simvastatin (ZOCOR) 40 MG tablet Take 40 mg by mouth at bedtime.      Historical Provider, MD  spironolactone (ALDACTONE) 25 MG tablet Take 0.5 tablets (12.5 mg total) by mouth daily. 02/16/16   Wonda Cheng Nahser, MD   BP 152/62 mmHg  Pulse 70  Resp 17  SpO2 100% Physical Exam  Constitutional: He is oriented to person, place, and time. He appears well-developed. No distress.  HENT:  Head: Normocephalic and  atraumatic.  Eyes: Conjunctivae and EOM are normal.  Cardiovascular: Normal rate and regular rhythm.   Pulmonary/Chest: Effort normal. No stridor. No respiratory distress.  Abdominal: He exhibits no distension.  Musculoskeletal: He exhibits no edema.  Neurological: He is alert and oriented to person, place, and time. He displays no atrophy and no tremor. No cranial nerve deficit or sensory deficit. He exhibits normal muscle tone. He displays no seizure activity. Coordination normal.  Skin: Skin is warm and dry.  Psychiatric: He has a normal mood and affect.  Nursing note and vitals reviewed.   ED Course  Procedures (including critical care time) Labs Review Labs Reviewed  COMPREHENSIVE METABOLIC PANEL - Abnormal; Notable for the following:    Glucose, Bld 117 (*)    BUN 25 (*)    Creatinine, Ser 1.43 (*)    Total Protein 6.1 (*)    Albumin 3.1 (*)    ALT 15 (*)    GFR calc non Af Amer 47 (*)    GFR calc Af Amer 55 (*)    All other components within normal limits  CBC WITH DIFFERENTIAL/PLATELET - Abnormal; Notable for the following:    RBC 3.83 (*)    Hemoglobin 11.2 (*)    HCT 34.7 (*)    All other components within normal limits  APTT  TROPONIN I  PROTIME-INR  URINE RAPID DRUG SCREEN, HOSP PERFORMED  URINALYSIS, ROUTINE W REFLEX MICROSCOPIC (NOT AT [facility])  ETHANOL    Imaging Review Ct Head Wo Contrast  02/27/2016  CLINICAL DATA:  75 year old male with bilateral upper and lower extremity weakness, blurred vision and slurred speech. EXAM: CT HEAD WITHOUT CONTRAST TECHNIQUE: Contiguous axial images were obtained from the base of the skull through the vertex without intravenous contrast. COMPARISON:  01/24/2014 MR and prior studies FINDINGS: Mild chronic small-vessel white matter ischemic changes again noted. No acute intracranial abnormalities are identified, including mass lesion or mass effect, hydrocephalus, extra-axial fluid collection, midline shift, hemorrhage, or acute  infarction. The visualized bony calvarium is unremarkable. IMPRESSION: No evidence of acute intracranial abnormality. Mild chronic small-vessel white matter ischemic changes. Electronically Signed   By: Margarette Canada M.D.   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 Upper and lower extremity weakness, blurred vision and slurred speech. Initial encounter. 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 EXAM: MRI HEAD WITHOUT CONTRAST TECHNIQUE: Multiplanar, multiecho pulse sequences of the brain and surrounding structures were obtained without intravenous contrast. COMPARISON:  Head CT  without contrast 02/27/2016. Brain MRI 01/24/2014. FINDINGS: Major intracranial vascular flow voids are stable. Linear and nodular restricted diffusion in the left paracentral pons (series 4, image 17) with no associated hemorrhage or mass effect. No other restricted diffusion identified. Extensive chronic small vessel ischemia in the brainstem with other progression since 2015 - including new chronic lacunes the central and right paracentral pons. Expected evolution of the right middle cerebellar peduncle lacune seen on the prior study. A chronic lacune in the right thalamus is new since 2015. Occasional chronic cerebral white matter lacunar infarcts appear stable. No supratentorial cortical encephalomalacia. No chronic hemorrhage identified. No midline shift, mass effect, evidence of mass lesion, ventriculomegaly, extra-axial collection or acute intracranial hemorrhage. Cervicomedullary junction and pituitary are within normal limits. Negative visualized cervical spine. Visible internal auditory structures appear normal. Stable and relatively well pneumatized paranasal sinuses and mastoids. Postoperative changes to both globes. Negative orbit and scalp soft tissues otherwise. Bone marrow signal is stable and  within normal limits. IMPRESSION: 1. Acute lacunar infarcts in the left pons. No associated hemorrhage or mass effect. 2. Underlying advanced chronic small vessel disease with progression with other progression in the brain stem and right thalamus since 2015. Electronically Signed   By: Genevie Ann M.D.   On: 02/27/2016 21:39   I have personally reviewed and evaluated these images and lab results as part of my medical decision-making.   EKG Interpretation   Date/Time:  Sunday February 27 2016 17:30:30 EDT Ventricular Rate:  70 PR Interval:  205 QRS Duration: 134 QT Interval:  389 QTC Calculation: 420 R Axis:   1 Text Interpretation:  Sinus rhythm Left bundle branch block Abnormal ekg  Confirmed by Brizeida Mcmurry  MD ([id number]) on 02/27/2016 5:34:42 PM     10 :12 PM Patient and family aware of results, I discussed them with neurology, and our neurologist has seen the patient. As the patient is oriented Plavix, new medication for additional emergent treatment.  MDM   Patient with history of prior stroke presents with new right leg, right arm dysesthesia. Patient has no objective deficiency, but given his description of a new sensory loss, history of stroke CT, MRI were performed. Patient's found to have a new acute stroke. After discussion with neurology patient was admitted for further evaluation, management.   Carmin Muskrat, MD 02/27/16 2213

## 2016-02-27 NOTE — Consult Note (Signed)
Admission H&P    Chief Complaint: New onset numbness and weakness involving right extremities.  HPI: [name] is an 75 y.o. male with a history of diabetes mellitus, hyperlipidemia and previous stroke presenting with new onset weakness and numbness involving right extremities as well as slight dizziness. Features also become more slurred. CT scan of his head was unremarkable. MRI showed acute left pontine infarction. Patient has been taking Plavix 75 mg per day. He has noticed slight worsening of dysphagia and some onset of his symptoms at about 10 AM today. Weakness and numbness involving right extremities has improved but still aren't back to baseline. NIH stroke score was 5.  LSN: 10 AM on 02/27/2016 tPA Given: No: Beyond time under for treatment consideration mRankin:  Past Medical History  Diagnosis Date  . Diabetes mellitus   . Hyperlipidemia   . Allergy   . Cataract   . Colon polyps 2012    Colonoscopy  . Diverticulosis 2012    Colonoscopy     Past Surgical History  Procedure Laterality Date  . Cataract extraction      both eyes  . Neck surgery    . Colon surgery      to repair intestines as child  . Knee arthroscopy      left  . Colonoscopy  10/20/2011    Procedure: COLONOSCOPY;  Surgeon: Inda Castle, MD;  Location: [facility];  Service: Endoscopy;  Laterality: N/A;  . Hot hemostasis  10/20/2011    Procedure: HOT HEMOSTASIS (ARGON PLASMA COAGULATION/BICAP);  Surgeon: Inda Castle, MD;  Location: [facility];  Service: Endoscopy;  Laterality: N/A;    Family History  Problem Relation Age of Onset  . Colon cancer Neg Hx   . Esophageal cancer Neg Hx   . Stomach cancer Neg Hx   . Anesthesia problems Neg Hx   . Hypotension Neg Hx   . Malignant hyperthermia Neg Hx   . Pseudochol deficiency Neg Hx    Social History:  reports that he quit smoking about 2 years ago. His smoking use included Cigarettes. He has never used smokeless tobacco. He reports that he  drinks alcohol. He reports that he does not use illicit drugs.  Allergies: No Known Allergies  Medications: Preadmission medications were reviewed by me.  ROS: History obtained from the patient  General ROS: negative for - chills, fatigue, fever, night sweats, weight gain or weight loss Psychological ROS: negative for - behavioral disorder, hallucinations, memory difficulties, mood swings or suicidal ideation Ophthalmic ROS: negative for - blurry vision, double vision, eye pain or loss of vision ENT ROS: negative for - epistaxis, nasal discharge, oral lesions, sore throat, tinnitus or vertigo Allergy and Immunology ROS: negative for - hives or itchy/watery eyes Hematological and Lymphatic ROS: negative for - bleeding problems, bruising or swollen lymph nodes Endocrine ROS: negative for - galactorrhea, hair pattern changes, polydipsia/polyuria or temperature intolerance Respiratory ROS: negative for - cough, hemoptysis, shortness of breath or wheezing Cardiovascular ROS: negative for - chest pain, dyspnea on exertion, edema or irregular heartbeat Gastrointestinal ROS: negative for - abdominal pain, diarrhea, hematemesis, nausea/vomiting or stool incontinence Genito-Urinary ROS: negative for - dysuria, hematuria, incontinence or urinary frequency/urgency Musculoskeletal ROS: negative for - joint swelling or muscular weakness Neurological ROS: as noted in HPI Dermatological ROS: negative for rash and skin lesion changes  Physical Examination: Blood pressure 143/62, pulse 67, temperature 98.2 F (36.8 C), temperature source Oral, resp. rate 18, height '5\' 10"'  (1.778 m), weight 72.576 kg (  160 lb), SpO2 99 %.  HEENT-  Normocephalic, no lesions, without obvious abnormality.  Normal external eye and conjunctiva.  Normal TM's bilaterally.  Normal auditory canals and external ears. Normal external nose, mucus membranes and septum.  Normal pharynx. Neck supple with no masses, nodes, nodules or  enlargement. Cardiovascular - regular rate and rhythm, S1, S2 normal, no murmur, click, rub or gallop Lungs - chest clear, no wheezing, rales, normal symmetric air entry Abdomen - soft, non-tender; bowel sounds normal; no masses,  no organomegaly Extremities - no joint deformities, effusion, or inflammation and no edema  Neurologic Examination: Mental Status: Alert, oriented, thought content appropriate.  Speech slightly slurred without evidence of aphasia. Able to follow commands without difficulty. Cranial Nerves: II-Visual fields were normal. III/IV/VI-Pupils were equal and reacted normally to light. Extraocular movements were full and conjugate.    V/VII-this perception of tactile sensation over right side of the face compared to left no facial weakness. VIII-normal. X-mild dysarthria; symmetrical palatal movement. XI: trapezius strength/neck flexion strength normal bilaterally XII-midline tongue extension with normal strength. Motor: Slight drift of right upper and lower extremities; motor exam otherwise unremarkable. Sensory: Reduced perception of tactile sensation over right extremities compared to left extremities. Deep Tendon Reflexes: 1+ and symmetric. Plantars: Mute bilaterally Cerebellar: Slight difficulty with to nose testing with use of right upper extremity. Carotid auscultation: Normal  Results for orders placed or performed during the hospital encounter of 02/27/16 (from the past 48 hour(s))  APTT     Status: None   Collection Time: 02/27/16  5:44 PM  Result Value Ref Range   aPTT 36 24 - 37 seconds  Comprehensive metabolic panel     Status: Abnormal   Collection Time: 02/27/16  5:44 PM  Result Value Ref Range   Sodium 139 135 - 145 mmol/L   Potassium 4.8 3.5 - 5.1 mmol/L   Chloride 109 101 - 111 mmol/L   CO2 24 22 - 32 mmol/L   Glucose, Bld 117 (H) 65 - 99 mg/dL   BUN 25 (H) 6 - 20 mg/dL   Creatinine, Ser 1.43 (H) 0.61 - 1.24 mg/dL   Calcium 9.2 8.9 - 10.3  mg/dL   Total Protein 6.1 (L) 6.5 - 8.1 g/dL   Albumin 3.1 (L) 3.5 - 5.0 g/dL   AST 19 15 - 41 U/L   ALT 15 (L) 17 - 63 U/L   Alkaline Phosphatase 62 38 - 126 U/L   Total Bilirubin 0.5 0.3 - 1.2 mg/dL   GFR calc non Af Amer 47 (L) >60 mL/min   GFR calc Af Amer 55 (L) >60 mL/min    Comment: (NOTE) The eGFR has been calculated using the CKD EPI equation. This calculation has not been validated in all clinical situations. eGFR's persistently <60 mL/min signify possible Chronic Kidney Disease.    Anion gap 6 5 - 15  CBC WITH DIFFERENTIAL     Status: Abnormal   Collection Time: 02/27/16  5:44 PM  Result Value Ref Range   WBC 6.1 4.0 - 10.5 K/uL   RBC 3.83 (L) 4.22 - 5.81 MIL/uL   Hemoglobin 11.2 (L) 13.0 - 17.0 g/dL   HCT 34.7 (L) 39.0 - 52.0 %   MCV 90.6 78.0 - 100.0 fL   MCH 29.2 26.0 - 34.0 pg   MCHC 32.3 30.0 - 36.0 g/dL   RDW 14.2 11.5 - 15.5 %   Platelets 228 150 - 400 K/uL   Neutrophils Relative % 63 %   Neutro  Abs 3.9 1.7 - 7.7 K/uL   Lymphocytes Relative 28 %   Lymphs Abs 1.7 0.7 - 4.0 K/uL   Monocytes Relative 7 %   Monocytes Absolute 0.4 0.1 - 1.0 K/uL   Eosinophils Relative 2 %   Eosinophils Absolute 0.1 0.0 - 0.7 K/uL   Basophils Relative 0 %   Basophils Absolute 0.0 0.0 - 0.1 K/uL  Troponin I     Status: None   Collection Time: 02/27/16  5:44 PM  Result Value Ref Range   Troponin I <0.03 <0.031 ng/mL    Comment:        NO INDICATION OF MYOCARDIAL INJURY.   Protime-INR     Status: None   Collection Time: 02/27/16  5:44 PM  Result Value Ref Range   Prothrombin Time 13.9 11.6 - 15.2 seconds   INR 1.05 0.00 - 1.49  Urine rapid drug screen (hosp performed)not at [facility]     Status: None   Collection Time: 02/27/16  7:45 PM  Result Value Ref Range   Opiates NONE DETECTED NONE DETECTED   Cocaine NONE DETECTED NONE DETECTED   Benzodiazepines NONE DETECTED NONE DETECTED   Amphetamines NONE DETECTED NONE DETECTED   Tetrahydrocannabinol NONE DETECTED NONE DETECTED    Barbiturates NONE DETECTED NONE DETECTED    Comment:        DRUG SCREEN FOR MEDICAL PURPOSES ONLY.  IF CONFIRMATION IS NEEDED FOR ANY PURPOSE, NOTIFY LAB WITHIN 5 DAYS.        LOWEST DETECTABLE LIMITS FOR URINE DRUG SCREEN Drug Class       Cutoff (ng/mL) Amphetamine      1000 Barbiturate      200 Benzodiazepine   631 Tricyclics       497 Opiates          300 Cocaine          300 THC              50   Urinalysis, Routine w reflex microscopic (not at [facility])     Status: None   Collection Time: 02/27/16  7:45 PM  Result Value Ref Range   Color, Urine YELLOW YELLOW   APPearance CLEAR CLEAR   Specific Gravity, Urine 1.019 1.005 - 1.030   pH 5.5 5.0 - 8.0   Glucose, UA NEGATIVE NEGATIVE mg/dL   Hgb urine dipstick NEGATIVE NEGATIVE   Bilirubin Urine NEGATIVE NEGATIVE   Ketones, ur NEGATIVE NEGATIVE mg/dL   Protein, ur NEGATIVE NEGATIVE mg/dL   Nitrite NEGATIVE NEGATIVE   Leukocytes, UA NEGATIVE NEGATIVE    Comment: MICROSCOPIC NOT DONE ON URINES WITH NEGATIVE PROTEIN, BLOOD, LEUKOCYTES, NITRITE, OR GLUCOSE <1000 mg/dL.   Ct Head Wo Contrast  02/27/2016  CLINICAL DATA:  75 year old male with bilateral upper and lower extremity weakness, blurred vision and slurred speech. EXAM: CT HEAD WITHOUT CONTRAST TECHNIQUE: Contiguous axial images were obtained from the base of the skull through the vertex without intravenous contrast. COMPARISON:  01/24/2014 MR and prior studies FINDINGS: Mild chronic small-vessel white matter ischemic changes again noted. No acute intracranial abnormalities are identified, including mass lesion or mass effect, hydrocephalus, extra-axial fluid collection, midline shift, hemorrhage, or acute infarction. The visualized bony calvarium is unremarkable. IMPRESSION: No evidence of acute intracranial abnormality. Mild chronic small-vessel white matter ischemic changes. Electronically Signed   By: Margarette Canada M.D.   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 Upper  and lower extremity weakness, blurred vision and slurred speech. Initial encounter. 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 EXAM: MRI HEAD WITHOUT CONTRAST TECHNIQUE: Multiplanar, multiecho pulse sequences of the brain and surrounding structures were obtained without intravenous contrast. COMPARISON:  Head CT without contrast 02/27/2016. Brain MRI 01/24/2014. FINDINGS: Major intracranial vascular flow voids are stable. Linear and nodular restricted diffusion in the left paracentral pons (series 4, image 17) with no associated hemorrhage or mass effect. No other restricted diffusion identified. Extensive chronic small vessel ischemia in the brainstem with other progression since 2015 - including new chronic lacunes the central and right paracentral pons. Expected evolution of the right middle cerebellar peduncle lacune seen on the prior study. A chronic lacune in the right thalamus is new since 2015. Occasional chronic cerebral white matter lacunar infarcts appear stable. No supratentorial cortical encephalomalacia. No chronic hemorrhage identified. No midline shift, mass effect, evidence of mass lesion, ventriculomegaly, extra-axial collection or acute intracranial hemorrhage. Cervicomedullary junction and pituitary are within normal limits. Negative visualized cervical spine. Visible internal auditory structures appear normal. Stable and relatively well pneumatized paranasal sinuses and mastoids. Postoperative changes to both globes. Negative orbit and scalp soft tissues otherwise. Bone marrow signal is stable and within normal limits. IMPRESSION: 1. Acute lacunar infarcts in the left pons. No associated hemorrhage or mass effect. 2. Underlying advanced chronic small vessel disease with progression with other progression in the brain stem and right thalamus since 2015. Electronically  Signed   By: Genevie Ann M.D.   On: 02/27/2016 21:39    Assessment: 75 y.o. male with multiple risk factors for stroke as well as previous stroke presenting with acute recurrent ischemic stroke involving the left medial pons.  Stroke Risk Factors - diabetes mellitus, family history and hyperlipidemia, smoking  Plan: 1. HgbA1c, fasting lipid panel 2. MRA  of the brain without contrast 3. PT consult, OT consult, Speech consult 4. Echocardiogram 5. Carotid dopplers 6. Prophylactic therapy-Antiplatelet med: Plavix  7. Risk factor modification 8. Telemetry monitoring  C.R. Nicole Kindred, MD  02/27/2016, 10:29 PM

## 2016-02-27 NOTE — H&P (Signed)
History and Physical    [name] [id number] DOB: [birth year] DOA: 02/27/2016  PCP: Tivis Ringer, MD   Patient coming from: Home.  Chief Complaint: Right-sided weakness.  HPI: [name] is a 75 y.o. male with medical history significant of CVA, type 2 diabetes, hyperlipidemia, seasonal allergy, tobacco use disorder, cataracts, diverticulosis, polyposis coli who comes in the emergency department due to right-sided weakness since 10 AM.  Per patient, he woke up around 6:30 in the morning and was feeling normal. Around 10 AM, he went outside his yard and started experiencing numbness/ weakness of the right sided extremities, gait disturbance and slurred speech. Since then, his symptoms have improved, but still persists. Per patient's wife, his speech is close to baseline, but still mildly slurred. The patient came into the emergency department about 7-1/2 hours after symptoms started, outside of the window for tPA administration.  ED Course: Workup is significant for anemia and acute lacunar infarcts of the pons.  Review of Systems: As per HPI otherwise 10 point review of systems negative.    Past Medical History  Diagnosis Date  . Diabetes mellitus   . Hyperlipidemia   . Allergy   . Cataract   . Colon polyps 2012    Colonoscopy  . Diverticulosis 2012    Colonoscopy     Past Surgical History  Procedure Laterality Date  . Cataract extraction      both eyes  . Neck surgery    . Colon surgery      to repair intestines as child  . Knee arthroscopy      left  . Colonoscopy  10/20/2011    Procedure: COLONOSCOPY;  Surgeon: Inda Castle, MD;  Location: [facility];  Service: Endoscopy;  Laterality: N/A;  . Hot hemostasis  10/20/2011    Procedure: HOT HEMOSTASIS (ARGON PLASMA COAGULATION/BICAP);  Surgeon: Inda Castle, MD;  Location: [facility];  Service: Endoscopy;  Laterality: N/A;     reports that he quit smoking about 2 years ago. His smoking use included  Cigarettes. He has never used smokeless tobacco. He reports that he drinks alcohol. He reports that he does not use illicit drugs.  No Known Allergies  Family History  Problem Relation Age of Onset  . Colon cancer Neg Hx   . Esophageal cancer Neg Hx   . Stomach cancer Neg Hx   . Anesthesia problems Neg Hx   . Hypotension Neg Hx   . Malignant hyperthermia Neg Hx   . Pseudochol deficiency Neg Hx   . Diabetes Mellitus II Mother     Prior to Admission medications   Medication Sig Start Date End Date Taking? Authorizing Provider  carvedilol (COREG) 12.5 MG tablet Take one tablet by mouth daily 11/03/15  Yes [name], MD  clopidogrel (PLAVIX) 75 MG tablet Take 1 tablet (75 mg total) by mouth daily with breakfast. 04/05/15  Yes Rosalin Hawking, MD  furosemide (LASIX) 40 MG tablet Take 1 tablet (40 mg total) by mouth daily. 12/16/14  Yes [name], MD  glimepiride (AMARYL) 1 MG tablet Take 1 mg by mouth 2 (two) times daily.   Yes Historical Provider, MD  losartan (COZAAR) 100 MG tablet Take 1 tablet (100 mg total) by mouth daily. 01/26/14  Yes Adeline Saralyn Pilar, MD  metFORMIN (GLUCOPHAGE) 1000 MG tablet Take 1,000 mg by mouth 2 (two) times daily.  10/11/15  Yes Historical Provider, MD  montelukast (SINGULAIR) 10 MG tablet Take 10 mg by mouth every morning.  Yes Historical Provider, MD  potassium chloride SA (K-DUR,KLOR-CON) 20 MEQ tablet Take 0.5 tablets (10 mEq total) by mouth daily. 01/06/15  Yes [name], MD  simvastatin (ZOCOR) 40 MG tablet Take 40 mg by mouth daily.    Yes Historical Provider, MD  spironolactone (ALDACTONE) 25 MG tablet Take 0.5 tablets (12.5 mg total) by mouth daily. 02/16/16  Yes [name], MD    Physical Exam: Filed Vitals:   02/27/16 1945 02/27/16 2230 02/27/16 2245 02/27/16 2300  BP: 143/62 156/62 173/84 161/66  Pulse: 67 66 68 65  Temp:      TempSrc:      Resp: 18 18 27 17   Height:      Weight:      SpO2: 99% 100% 99% 99%       Constitutional: NAD, calm, comfortable Filed Vitals:   02/27/16 1945 02/27/16 2230 02/27/16 2245 02/27/16 2300  BP: 143/62 156/62 173/84 161/66  Pulse: 67 66 68 65  Temp:      TempSrc:      Resp: 18 18 27 17   Height:      Weight:      SpO2: 99% 100% 99% 99%   Eyes: PERRL, lids and conjunctivae normal ENMT: Mucous membranes are moist. Posterior pharynx clear of any exudate or lesions.Normal dentition.  Neck: normal, supple, no masses, no thyromegaly Respiratory: Bilateral wheezing, scattered rhonchi, no crackles. Normal respiratory effort. No accessory muscle use.  Cardiovascular: Regular rate and rhythm, no murmurs / rubs / gallops. No extremity edema. 2+ pedal pulses. No carotid bruits.  Abdomen: no tenderness, no masses palpated. No hepatosplenomegaly. Bowel sounds positive.  Musculoskeletal: no clubbing / cyanosis. No joint deformity upper and lower extremities. Good ROM, no contractures. Normal muscle tone.  Skin: no rashes, lesions, ulcers. No induration Neurologic: CN 2-12 grossly intact. Decreased sensation on right lower extremity, mild right pronator drift, 4.5/ 5 right-sided hemiparesis. Mildly impaired nose to finger coordination on the right. Psychiatric: Normal judgment and insight. Alert and oriented x 4. Normal mood.     Labs on Admission: I have personally reviewed following labs and imaging studies  CBC:  Recent Labs Lab 02/27/16 1744  WBC 6.1  NEUTROABS 3.9  HGB 11.2*  HCT 34.7*  MCV 90.6  PLT [id number]   Basic Metabolic Panel:  Recent Labs Lab 02/27/16 1744  NA 139  K 4.8  CL 109  CO2 24  GLUCOSE 117*  BUN 25*  CREATININE 1.43*  CALCIUM 9.2   GFR: Estimated Creatinine Clearance: 47.9 mL/min (by C-G formula based on Cr of 1.43). Liver Function Tests:  Recent Labs Lab 02/27/16 1744  AST 19  ALT 15*  ALKPHOS 62  BILITOT 0.5  PROT 6.1*  ALBUMIN 3.1*   Coagulation Profile:  Recent Labs Lab 02/27/16 1744  INR 1.05   Cardiac  Enzymes:  Recent Labs Lab 02/27/16 1744  TROPONINI <0.03   Urine analysis:    Component Value Date/Time   COLORURINE YELLOW 02/27/2016 1945   APPEARANCEUR CLEAR 02/27/2016 1945   LABSPEC 1.019 02/27/2016 1945   PHURINE 5.5 02/27/2016 1945   GLUCOSEU NEGATIVE 02/27/2016 1945   HGBUR NEGATIVE 02/27/2016 1945   BILIRUBINUR NEGATIVE 02/27/2016 1945   KETONESUR NEGATIVE 02/27/2016 1945   PROTEINUR NEGATIVE 02/27/2016 1945   UROBILINOGEN 0.2 01/23/2014 1600   NITRITE NEGATIVE 02/27/2016 1945   LEUKOCYTESUR NEGATIVE 02/27/2016 1945    Radiological Exams on Admission: Ct Head Wo Contrast  02/27/2016  CLINICAL DATA:  75 year old male with bilateral upper  and lower extremity weakness, blurred vision and slurred speech. EXAM: CT HEAD WITHOUT CONTRAST TECHNIQUE: Contiguous axial images were obtained from the base of the skull through the vertex without intravenous contrast. COMPARISON:  01/24/2014 MR and prior studies FINDINGS: Mild chronic small-vessel white matter ischemic changes again noted. No acute intracranial abnormalities are identified, including mass lesion or mass effect, hydrocephalus, extra-axial fluid collection, midline shift, hemorrhage, or acute infarction. The visualized bony calvarium is unremarkable. IMPRESSION: No evidence of acute intracranial abnormality. Mild chronic small-vessel white matter ischemic changes. Electronically Signed   By: Margarette Canada M.D.   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 Upper and lower extremity weakness, blurred vision and slurred speech. Initial encounter. 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 EXAM: MRI HEAD WITHOUT CONTRAST TECHNIQUE: Multiplanar, multiecho pulse sequences of the brain and surrounding structures were obtained without intravenous contrast. COMPARISON:  Head CT without contrast 02/27/2016. Brain MRI  01/24/2014. FINDINGS: Major intracranial vascular flow voids are stable. Linear and nodular restricted diffusion in the left paracentral pons (series 4, image 17) with no associated hemorrhage or mass effect. No other restricted diffusion identified. Extensive chronic small vessel ischemia in the brainstem with other progression since 2015 - including new chronic lacunes the central and right paracentral pons. Expected evolution of the right middle cerebellar peduncle lacune seen on the prior study. A chronic lacune in the right thalamus is new since 2015. Occasional chronic cerebral white matter lacunar infarcts appear stable. No supratentorial cortical encephalomalacia. No chronic hemorrhage identified. No midline shift, mass effect, evidence of mass lesion, ventriculomegaly, extra-axial collection or acute intracranial hemorrhage. Cervicomedullary junction and pituitary are within normal limits. Negative visualized cervical spine. Visible internal auditory structures appear normal. Stable and relatively well pneumatized paranasal sinuses and mastoids. Postoperative changes to both globes. Negative orbit and scalp soft tissues otherwise. Bone marrow signal is stable and within normal limits. IMPRESSION: 1. Acute lacunar infarcts in the left pons. No associated hemorrhage or mass effect. 2. Underlying advanced chronic small vessel disease with progression with other progression in the brain stem and right thalamus since 2015. Electronically Signed   By: Genevie Ann M.D.   On: 02/27/2016 21:39    EKG: Independently reviewed. Vent. rate 70 BPM PR interval 205 ms QRS duration 134 ms QT/QTc 389/420 ms P-R-T axes 71 1 152 Sinus rhythm Left bundle branch block Abnormal ekg  Assessment/Plan Principal Problem:   Acute CVA (cerebrovascular accident) ([place]) Admit to telemetry/inpatient. Frequent neuro checks. Check fasting lipids and hemoglobin A1c. Check echocardiogram. Check carotid Doppler. PT/OT  evaluation Neurology is following.  Active Problems:   Hyperlipidemia On simvastatin. Monitor LFTs.    CAD, NATIVE VESSEL Resume Plavix, simvastatin and carvedilol once he passes a swallow screen    Essential hypertension Resume carvedilol, furosemide, losartan, spironolactone and potassium supplementation once he passes the swallow screening. Monitor blood pressure    Chronic systolic CHF (congestive heart failure) ([facility]) Resume carvedilol, furosemide, losartan, spironolactone and potassium supplementation once he passes the swallow screening.    DM2 (diabetes mellitus, type 2) (HCC) Carbohydrate modified diet once cleared for oral intake. CBG monitoring with regular insulin sliding scale.  Resume metformin and Amaryl once cleared for oral intake.    Anemia Check anemia panel. Monitor H&H.    Tobacco use disorder Declined nicotine replacement therapy.    DVT prophylaxis: Lovenox subcutaneous. Code Status: Full code.  Family Communication: His wife is present in the room. Disposition Plan: Admit for further evaluation and treatment. Consults called: Neurology (Dr. Wallie Char) Admission status: Inpatient/telemetry   Reubin Milan MD [facility] Pager [phone].  If 7PM-7AM, please contact night-coverage [email or web address] Password [email or web address]  02/27/2016, 11:16 PM

## 2016-02-27 NOTE — ED Notes (Signed)
MD at bedside. 

## 2016-02-28 ENCOUNTER — Ambulatory Visit (HOSPITAL_COMMUNITY): Payer: Medicare Other

## 2016-02-28 ENCOUNTER — Inpatient Hospital Stay (HOSPITAL_COMMUNITY): Payer: Medicare Other

## 2016-02-28 DIAGNOSIS — E1149 Type 2 diabetes mellitus with other diabetic neurological complication: Secondary | ICD-10-CM

## 2016-02-28 DIAGNOSIS — I639 Cerebral infarction, unspecified: Principal | ICD-10-CM

## 2016-02-28 DIAGNOSIS — E785 Hyperlipidemia, unspecified: Secondary | ICD-10-CM

## 2016-02-28 DIAGNOSIS — D509 Iron deficiency anemia, unspecified: Secondary | ICD-10-CM

## 2016-02-28 DIAGNOSIS — I5022 Chronic systolic (congestive) heart failure: Secondary | ICD-10-CM

## 2016-02-28 DIAGNOSIS — I10 Essential (primary) hypertension: Secondary | ICD-10-CM

## 2016-02-28 DIAGNOSIS — F17200 Nicotine dependence, unspecified, uncomplicated: Secondary | ICD-10-CM | POA: Diagnosis present

## 2016-02-28 LAB — CBC
HCT: 31.1 % — ABNORMAL LOW (ref 39.0–52.0)
Hemoglobin: 10 g/dL — ABNORMAL LOW (ref 13.0–17.0)
MCH: 29.2 pg (ref 26.0–34.0)
MCHC: 32.2 g/dL (ref 30.0–36.0)
MCV: 90.7 fL (ref 78.0–100.0)
Platelets: 210 10*3/uL (ref 150–400)
RBC: 3.43 MIL/uL — ABNORMAL LOW (ref 4.22–5.81)
RDW: 14.2 % (ref 11.5–15.5)
WBC: 5 10*3/uL (ref 4.0–10.5)

## 2016-02-28 LAB — VITAMIN B12: Vitamin B-12: 308 pg/mL (ref 180–914)

## 2016-02-28 LAB — LIPID PANEL
Cholesterol: 97 mg/dL (ref 0–200)
HDL: 28 mg/dL — ABNORMAL LOW (ref >40)
LDL Cholesterol: 51 mg/dL (ref 0–99)
Total CHOL/HDL Ratio: 3.5 RATIO
Triglycerides: 92 mg/dL (ref <150)
VLDL: 18 mg/dL (ref 0–40)

## 2016-02-28 LAB — COMPREHENSIVE METABOLIC PANEL
ALT: 14 U/L — ABNORMAL LOW (ref 17–63)
AST: 18 U/L (ref 15–41)
Albumin: 3 g/dL — ABNORMAL LOW (ref 3.5–5.0)
Alkaline Phosphatase: 55 U/L (ref 38–126)
Anion gap: 5 (ref 5–15)
BUN: 23 mg/dL — ABNORMAL HIGH (ref 6–20)
CO2: 23 mmol/L (ref 22–32)
Calcium: 8.3 mg/dL — ABNORMAL LOW (ref 8.9–10.3)
Chloride: 110 mmol/L (ref 101–111)
Creatinine, Ser: 1.34 mg/dL — ABNORMAL HIGH (ref 0.61–1.24)
GFR calc Af Amer: 59 mL/min — ABNORMAL LOW (ref >60)
GFR calc non Af Amer: 51 mL/min — ABNORMAL LOW (ref >60)
Glucose, Bld: 210 mg/dL — ABNORMAL HIGH (ref 65–99)
Potassium: 4.4 mmol/L (ref 3.5–5.1)
Sodium: 138 mmol/L (ref 135–145)
Total Bilirubin: 0.6 mg/dL (ref 0.3–1.2)
Total Protein: 5.5 g/dL — ABNORMAL LOW (ref 6.5–8.1)

## 2016-02-28 LAB — RETICULOCYTES
RBC.: 3.43 MIL/uL — ABNORMAL LOW (ref 4.22–5.81)
Retic Count, Absolute: 41.2 10*3/uL (ref 19.0–186.0)
Retic Ct Pct: 1.2 % (ref 0.4–3.1)

## 2016-02-28 LAB — IRON AND TIBC
Iron: 45 ug/dL (ref 45–182)
Saturation Ratios: 16 % — ABNORMAL LOW (ref 17.9–39.5)
TIBC: 277 ug/dL (ref 250–450)
UIBC: 232 ug/dL

## 2016-02-28 LAB — GLUCOSE, CAPILLARY
Glucose-Capillary: 127 mg/dL — ABNORMAL HIGH (ref 65–99)
Glucose-Capillary: 137 mg/dL — ABNORMAL HIGH (ref 65–99)
Glucose-Capillary: 173 mg/dL — ABNORMAL HIGH (ref 65–99)

## 2016-02-28 LAB — ETHANOL: Alcohol, Ethyl (B): <5 mg/dL (ref <5)

## 2016-02-28 LAB — FOLATE: Folate: 31.4 ng/mL (ref >5.9)

## 2016-02-28 LAB — FERRITIN: Ferritin: 18 ng/mL — ABNORMAL LOW (ref 24–336)

## 2016-02-28 MED ORDER — IOPAMIDOL (ISOVUE-370) INJECTION 76%
INTRAVENOUS | Status: AC
  Administered 2016-02-28: 50 mL
  Filled 2016-02-28: qty 50

## 2016-02-28 MED ORDER — GLUCERNA SHAKE PO LIQD
237.0000 mL | ORAL | Status: DC
  Filled 2016-02-28 (×2): qty 237

## 2016-02-28 NOTE — Evaluation (Signed)
Physical Therapy Evaluation Patient Details Name: [name] MRN: [id number] DOB: 10-02-1940 Today's Date: 02/28/2016   History of Present Illness  75 y.o. male admitted with a CVA which started while he was outside working in the yard and he noticed having Rt sided weakness. PMH: CVA, type 2 diabetes, hyperlipidemia, seasonal allergy, tobacco use disorder, cataracts, diverticulosis, polyposis coli.   Clinical Impression  Pt overall doing well with mobility but having mild instability during ambulation with independent to min assist to recover. Discussed with pt and daughter recommendation of having assist when up and walking at home. Pt states that his wife may take a couple weeks off work to stay at home with him. Anticipate pt will continue to progress and D/C to home with family support.     Follow Up Recommendations Outpatient PT;Supervision for mobility/OOB (pt declinced HHPT)    Equipment Recommendations  None recommended by PT    Recommendations for Other Services       Precautions / Restrictions Precautions Precautions: Fall Restrictions Weight Bearing Restrictions: No      Mobility  Bed Mobility               General bed mobility comments: up in chair upon arrival  Transfers Overall transfer level: Needs assistance Equipment used: None Transfers: Sit to/from Stand Sit to Stand: Min guard         General transfer comment: min guard for safety, mild instability but no gross loss of balance.   Ambulation/Gait Ambulation/Gait assistance: Min guard Ambulation Distance (Feet): 300 Feet Assistive device: None Gait Pattern/deviations: Step-through pattern Gait velocity: mild decrease   General Gait Details: Pt with intermittant mild loss of balance but able to recover with min support.   Stairs            Wheelchair Mobility    Modified Rankin (Stroke Patients Only) Modified Rankin (Stroke Patients Only) Pre-Morbid Rankin Score: No  symptoms Modified Rankin: Moderately severe disability     Balance Overall balance assessment: Needs assistance Sitting-balance support: No upper extremity supported Sitting balance-Leahy Scale: Good     Standing balance support: During functional activity Standing balance-Leahy Scale: Good Standing balance comment: mild instability during ambulation                             Pertinent Vitals/Pain Pain Assessment: No/denies pain    Home Living Family/patient expects to be discharged to:: Private residence Living Arrangements: Spouse/significant other Available Help at Discharge: Family Type of Home: House Home Access: Stairs to enter Entrance Stairs-Rails: Right;Left;Can reach both Entrance Stairs-Number of Steps: 7 Home Layout: Two level;Able to live on main level with bedroom/bathroom   Additional Comments: Pt reports that his spouse does work full time but she might be able to take some time off.     Prior Function Level of Independence: Independent         Comments: Enjoys yardwork.      Hand Dominance        Extremity/Trunk Assessment   Upper Extremity Assessment: Overall WFL for tasks assessed           Lower Extremity Assessment: Overall WFL for tasks assessed         Communication   Communication: No difficulties  Cognition Arousal/Alertness: Awake/alert Behavior During Therapy: WFL for tasks assessed/performed Overall Cognitive Status: Within Functional Limits for tasks assessed  General Comments      Exercises        Assessment/Plan    PT Assessment Patient needs continued PT services  PT Diagnosis Difficulty walking   PT Problem List Decreased strength;Decreased activity tolerance;Decreased balance;Decreased mobility  PT Treatment Interventions DME instruction;Gait training;Stair training;Functional mobility training;Therapeutic activities;Therapeutic exercise;Balance  training;Neuromuscular re-education;Patient/family education   PT Goals (Current goals can be found in the Care Plan section) Acute Rehab PT Goals Patient Stated Goal: go home PT Goal Formulation: With patient Time For Goal Achievement: 03/13/16 Potential to Achieve Goals: Good    Frequency Min 4X/week   Barriers to discharge        Co-evaluation               End of Session Equipment Utilized During Treatment: Gait belt Activity Tolerance: Patient tolerated treatment well Patient left: in chair;with call bell/phone within reach;with chair alarm set;with family/visitor present Nurse Communication: Mobility status         Time: [id number] PT Time Calculation (min) (ACUTE ONLY): 20 min   Charges:   PT Evaluation $PT Eval Moderate Complexity: 1 Procedure     PT G Codes:        Cassell Clement, PT, CSCS Pager [phone] Office [phone]  02/28/2016, 1:54 PM

## 2016-02-28 NOTE — Evaluation (Signed)
Occupational Therapy Evaluation Patient Details Name: [name] MRN: [id number] DOB: [birth year] Today's Date: 02/28/2016    History of Present Illness 75 y.o. male admitted due to right sided weakness. PMH: CVA, type 2 diabetes, hyperlipidemia, seasonal allergy, tobacco use disorder, cataracts, diverticulosis, polyposis coli, CHF. MRI showed Acute lacunar infarcts in the left pons.   Clinical Impression   Pt admitted with above. Pt independent with ADLs, PTA. Feel pt will benefit from acute OT to increase independence prior to d/c. Pt with apparent vision deficit. Recommended pt get a full vision assessment at eye doctor and not to drive until he gets the results. Recommending Outpatient OT.     Follow Up Recommendations  Outpatient OT;Supervision/Assistance - 24 hour;Other (comment) (full visual field assessment at eye doctor)    Equipment Recommendations  None recommended by OT    Recommendations for Other Services       Precautions / Restrictions Precautions Precautions: Fall Restrictions Weight Bearing Restrictions: No      Mobility Bed Mobility Overal bed mobility: Needs Assistance Bed Mobility: Supine to Sit;Sit to Supine     Supine to sit: Supervision Sit to supine: Supervision    Transfers Overall transfer level: Needs assistance Transfers: Sit to/from Stand Sit to Stand: Supervision           Balance LOB when simulating functional task while standing with single leg stance and without UE support-assist given.  History of falls.                          ADL Overall ADL's : Needs assistance/impaired     Grooming: Wash/dry hands;Supervision/safety;Standing;Set up       Lower Body Bathing: Minimal assistance (standing-assist for balance) Lower Body Bathing Details (indicate cue type and reason): pt at setup and supervision level for LB bathing for doing part of LB task sitting and higher areas standing     Lower Body Dressing: Sit  to/from stand;Supervision/safety;Set up   Toilet Transfer: Min guard;Ambulation;Regular Toilet (stood and urinated at toilet)   Toileting- Water quality scientist and Hygiene: Supervision/safety (standing)       Functional mobility during ADLs:  (Min guard for ambulation; Assist for balance when simulating LB bathing while standing with single leg stance without UE support)  General ADL Comments: Recommended sitting for LB bathing on chair.      Vision Pt has glasses for all the time; reports history of cataract surgery; reports no change from baseline Vision Assessment?: Yes Visual Fields:  (inconsistent in both right and left fields)   Perception     Praxis      Pertinent Vitals/Pain Pain Assessment: 0-10 Pain Score: 7  Pain Location: right leg Pain Descriptors / Indicators: Aching Pain Intervention(s): Monitored during session     Hand Dominance Right   Extremity/Trunk Assessment Upper Extremity Assessment Upper Extremity Assessment: RUE deficits/detail;LUE deficits/detail RUE Deficits / Details: right elbow flexors slightly weaker compared to left; slight weakness in bilateral shoulder flexors RUE Sensation: decreased light touch RUE Coordination: decreased gross motor LUE Deficits / Details: slight weakness in bilateral shoulder flexors LUE Coordination: decreased gross motor (slightly off)   Lower Extremity Assessment Lower Extremity Assessment: Defer to PT evaluation; RLE felt slightly weaker       Communication Communication Communication: No difficulties   Cognition Arousal/Alertness: Awake/alert Behavior During Therapy: WFL for tasks assessed/performed Overall Cognitive Status:  (unsure of baseline-decreased safety awareness)  General Comments       Exercises       Shoulder Instructions      Home Living Family/patient expects to be discharged to:: Private residence Living Arrangements: Spouse/significant  other Available Help at Discharge: Family Type of Home: House Home Access: Stairs to enter [employer] of Steps: 7 Entrance Stairs-Rails: Right;Left;Can reach both [place]: Two level;Able to live on main level with bedroom/bathroom     Bathroom Shower/Tub: Occupational psychologist: Standard     Home Equipment: Shower seat   Additional Comments: per PT eval, pt reports that his spouse does work full time but she might be able to take some time off.       Prior Functioning/Environment Level of Independence: Independent        Comments: Enjoys yardwork.     OT Diagnosis: Disturbance of vision; Other (comment)-(decreased balance)   OT Problem List: Decreased strength;Pain;Decreased coordination;Decreased safety awareness;Decreased knowledge of precautions;Impaired balance (sitting and/or standing);Impaired vision/perception   OT Treatment/Interventions: Self-care/ADL training;DME and/or AE instruction;Therapeutic exercise;Therapeutic activities;Cognitive remediation/compensation;Visual/perceptual remediation/compensation;Patient/family education;Balance training    OT Goals(Current goals can be found in the care plan section) Acute Rehab OT Goals Patient Stated Goal: not stated OT Goal Formulation: With patient Time For Goal Achievement: 03/06/16 Potential to Achieve Goals: Good ADL Goals Pt Will Perform Lower Body Dressing: with modified independence;sit to/from stand (including going to gather items from low surface) Pt Will Transfer to Toilet: with modified independence;ambulating;regular height toilet Additional ADL Goal #1: Pt will independently perform HEP for bilateral UEs to increase gross motor coordination.  OT Frequency: Min 2X/week   Barriers to D/C:            Co-evaluation              End of Session Equipment Utilized During Treatment: Gait belt  Activity Tolerance: Patient tolerated treatment well Patient left: in bed;with  bed alarm set;with family/visitor present;with call bell/phone within reach   Time: 1639-1700 OT Time Calculation (min): 21 min Charges:  OT General Charges $OT Visit: 1 Procedure OT Evaluation $OT Eval Moderate Complexity: 1 Procedure G-CodesBenito Mccreedy OTR/L [id number] 02/28/2016, 5:19 PM

## 2016-02-28 NOTE — Care Management Important Message (Signed)
Important Message  Patient Details  Name: [name] MRN: [id number] Date of Birth: 07-05-1941   Medicare Important Message Given:  Yes    Loann Quill 02/28/2016, 11:59 AM

## 2016-02-28 NOTE — Progress Notes (Signed)
Pt arrived to unit via stretcher with RN from ED accompanied by spouse. Vs taken, oriented pt to unit policy.

## 2016-02-28 NOTE — Progress Notes (Signed)
[facility] PROGRESS NOTE  [name] [id number] DOB: 10-16-1940 DOA: 02/27/2016 PCP: Tivis Ringer, MD  Interim summary and HPI 75 y.o. male with medical history significant of CVA, type 2 diabetes, hyperlipidemia, seasonal allergy, tobacco use disorder, cataracts, diverticulosis, polyposis coli who comes in the emergency department due to right-sided weakness since 10 AM.  Per patient, he woke up around 6:30 in the morning and was feeling normal. Around 10 AM, he went outside his yard and started experiencing numbness/ weakness of the right sided extremities, gait disturbance and slurred speech. Since then, his symptoms have improved, but still persists. Per patient's wife, his speech is close to baseline, but still mildly slurred. The patient came into the emergency department about 7-1/2 hours   Complete work up and follow neurology recommendations  Assessment/Plan: Acute CVA (cerebrovascular accident) [facility]) Follow neurology rec's continue plavix for secondary prevention Complete stroke work up Follow A1C and continue risk factors modifications    Hyperlipidemia On simvastatin.   CAD, NATIVE VESSEL Continue statins, plavix and b-blockers  No CP or SOB   Essential hypertension Resume carvedilol, furosemide, losartan, spironolactone and potassium supplementation once he passes the swallow screening. Monitor blood pressure   Chronic systolic CHF (congestive heart failure) (HCC) Continue carvedilol, furosemide, losartan, spironolactone and potassium supplementation. Daily weights Low sodium diet Strict intake and output  Repeat echo as part of stroke work up pending    DM2 (diabetes mellitus, type 2) (HCC) Carbohydrate modified diet  CBG monitoring with regular insulin sliding scale.  Resume metformin and Amaryl at discharge Follow A1C   Anemia Anemia panel demonstrating iron deficiency anemia  Hgb 10.0 No overt bleeding appreciated At discharge  will start iron supplementation    Tobacco use disorder Declined nicotine replacement therapy. Cessation counseling provided   Code Status: Full Family Communication: no family at bedisde Disposition Plan: home with HHPT recommended (patient decline it); will complete work up and follow neurology rec's   Consultants:  Neurology   Procedures:  MRI: with Acute lacunar infarcts in the left pons. No associated hemorrhage or mass effect.  2-D echo: Pending  CT head and neck: pending   Antibiotics:  None   HPI/Subjective: Afebrile, CP, no SOB. No focal motor deficit appreciated. No dysarthria and no apraxia  Objective: Filed Vitals:   02/28/16 1120 02/28/16 1338  BP: 110/62 134/60  Pulse: 71 65  Temp: 98.1 F (36.7 C) 98.3 F (36.8 C)  Resp: 20 20    Intake/Output Summary (Last 24 hours) at 02/28/16 1432 Last data filed at 02/28/16 0700  Gross per 24 hour  Intake    240 ml  Output    425 ml  Net   -185 ml   Filed Weights   02/27/16 1737  Weight: 72.576 kg (160 lb)    Exam:   General:  Afebrile, no chest pain, no shortness of breath. Patient without dysarthria, oriented 3 and able to follow commands.  Cardiovascular: S1 and S2, no rubs, no gallops  Respiratory: Good air movement bilaterally, no wheezing, scattered rhonchi  Abdomen: Soft, nontender, nondistended, positive bowel sounds  Musculoskeletal: No edema, no cyanosis or clubbing  neurologic exam: Cranial nerves grossly intact, no dysarthria, apraxia or focal motor deficit. Patient endorses some numbness sensation of his right upper extremity and also mild weakness on his right lower extremity (muscle strength appears to be 5 out of 5 bilaterally).  Data Reviewed: Basic Metabolic Panel:  Recent Labs Lab 02/27/16 1744 02/28/16 0255  NA 139 138  K  4.8 4.4  CL 109 110  CO2 24 23  GLUCOSE 117* 210*  BUN 25* 23*  CREATININE 1.43* 1.34*  CALCIUM 9.2 8.3*   Liver Function Tests:  Recent  Labs Lab 02/27/16 1744 02/28/16 0255  AST 19 18  ALT 15* 14*  ALKPHOS 62 55  BILITOT 0.5 0.6  PROT 6.1* 5.5*  ALBUMIN 3.1* 3.0*   CBC:  Recent Labs Lab 02/27/16 1744 02/28/16 0255  WBC 6.1 5.0  NEUTROABS 3.9  --   HGB 11.2* 10.0*  HCT 34.7* 31.1*  MCV 90.6 90.7  PLT 228 210   Cardiac Enzymes:  Recent Labs Lab 02/27/16 1744  TROPONINI <0.03   CBG:  Recent Labs Lab 02/27/16 2342 02/28/16 1119  GLUCAP 193* 127*    No results found for this or any previous visit (from the past 240 hour(s)).   Studies: Dg Chest 2 View  02/28/2016  CLINICAL DATA:  Wheezing EXAM: CHEST  2 VIEW COMPARISON:  01/23/2014 FINDINGS: Chronic elevation of the right diaphragm. Stable borderline cardiomegaly. Negative aortic and hilar contours. No edema, consolidation, effusion, or pneumothorax. IMPRESSION: Stable exam.  No evidence of acute disease. Electronically Signed   By: Monte Fantasia M.D.   On: 02/28/2016 00:02   Ct Head Wo Contrast  02/27/2016  CLINICAL DATA:  75 year old male with bilateral upper and lower extremity weakness, blurred vision and slurred speech. EXAM: CT HEAD WITHOUT CONTRAST TECHNIQUE: Contiguous axial images were obtained from the base of the skull through the vertex without intravenous contrast. COMPARISON:  01/24/2014 MR and prior studies FINDINGS: Mild chronic small-vessel white matter ischemic changes again noted. No acute intracranial abnormalities are identified, including mass lesion or mass effect, hydrocephalus, extra-axial fluid collection, midline shift, hemorrhage, or acute infarction. The visualized bony calvarium is unremarkable. IMPRESSION: No evidence of acute intracranial abnormality. Mild chronic small-vessel white matter ischemic changes. Electronically Signed   By: Margarette Canada M.D.   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 Upper and lower extremity weakness, blurred  vision and slurred speech. Initial encounter. 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 EXAM: MRI HEAD WITHOUT CONTRAST TECHNIQUE: Multiplanar, multiecho pulse sequences of the brain and surrounding structures were obtained without intravenous contrast. COMPARISON:  Head CT without contrast 02/27/2016. Brain MRI 01/24/2014. FINDINGS: Major intracranial vascular flow voids are stable. Linear and nodular restricted diffusion in the left paracentral pons (series 4, image 17) with no associated hemorrhage or mass effect. No other restricted diffusion identified. Extensive chronic small vessel ischemia in the brainstem with other progression since 2015 - including new chronic lacunes the central and right paracentral pons. Expected evolution of the right middle cerebellar peduncle lacune seen on the prior study. A chronic lacune in the right thalamus is new since 2015. Occasional chronic cerebral white matter lacunar infarcts appear stable. No supratentorial cortical encephalomalacia. No chronic hemorrhage identified. No midline shift, mass effect, evidence of mass lesion, ventriculomegaly, extra-axial collection or acute intracranial hemorrhage. Cervicomedullary junction and pituitary are within normal limits. Negative visualized cervical spine. Visible internal auditory structures appear normal. Stable and relatively well pneumatized paranasal sinuses and mastoids. Postoperative changes to both globes. Negative orbit and scalp soft tissues otherwise. Bone marrow signal is stable and within normal limits. IMPRESSION: 1. Acute lacunar infarcts in the left pons. No associated hemorrhage or mass effect. 2. Underlying advanced chronic small vessel disease with progression with other  progression in the brain stem and right thalamus since 2015. Electronically Signed   By: Genevie Ann M.D.   On: 02/27/2016 21:39    Scheduled Meds: . enoxaparin (LOVENOX) injection  40 mg Subcutaneous QHS   . feeding supplement (GLUCERNA SHAKE)  237 mL Oral Q24H  . insulin aspart  0-5 Units Subcutaneous QHS  . insulin aspart  0-9 Units Subcutaneous TID WC  . sodium chloride flush  3 mL Intravenous Q12H   Continuous Infusions: . sodium chloride 100 mL/hr (02/28/16 0024)    Principal Problem:   Acute CVA (cerebrovascular accident) ([place]) Active Problems:   Hyperlipidemia   CAD, NATIVE VESSEL   Essential hypertension   Chronic systolic CHF (congestive heart failure) (HCC)   DM2 (diabetes mellitus, type 2) ([place])   Anemia   Tobacco use disorder    Time spent: 30 minutes    Barton Dubois  [facility] Pager [phone]. If 7PM-7AM, please contact night-coverage at [email or web address], password [facility] 02/28/2016, 2:32 PM  LOS: 1 day

## 2016-02-28 NOTE — Progress Notes (Signed)
Initial Nutrition Assessment   INTERVENTION:  Provide Glucerna Shake po once daily, provides 220 kcal and 10 grams of protein Monitor PO intake for adequacy   NUTRITION DIAGNOSIS:   Predicted suboptimal nutrient intake related to acute illness, poor appetite as evidenced by per patient/family report, mild depletion of muscle mass.   GOAL:   Patient will meet greater than or equal to 90% of their needs   MONITOR:   PO intake, Supplement acceptance, Labs, Skin, I & O's, Weight trends  REASON FOR ASSESSMENT:   Malnutrition Screening Tool    ASSESSMENT:   75 y.o. male with medical history significant of CVA, type 2 diabetes, hyperlipidemia, seasonal allergy, tobacco use disorder, cataracts, diverticulosis, polyposis coli who comes in the emergency department due to right-sided weakness .  Pt states that he used to weigh 265 lbs 2 years ago, lost weight after having a stroke followed by rehab and pneumonia. She reports maintaining 160 lbs over the past 5-6 months. He states that his appetite varies and he occasionally drinks Carnation Instant breakfast. He reports having a poor appetite yesterday, but is feeling better today and ate 100% of breakfast. He denies having and diet or nutrition questions or concerns at this time. RD encouraged adequate healthful PO intake.   Labs: low calcium, low hemoglobin, low ferritin  Diet Order:  Diet Carb Modified Fluid consistency:: Thin; Room service appropriate?: Yes  Skin:  Reviewed, no issues  Last BM:  6/4  Height:   Ht Readings from Last 1 Encounters:  02/27/16 5\' 10"  (1.778 m)    Weight:   Wt Readings from Last 1 Encounters:  02/27/16 160 lb (72.576 kg)    Ideal Body Weight:  75.5 kg  BMI:  Body mass index is 22.96 kg/(m^2).  Estimated Nutritional Needs:   Kcal:  1800-2000  Protein:  85-95 grams  Fluid:  1.8-2 L/day  EDUCATION NEEDS:   No education needs identified at this time  [place],  LDN Inpatient Clinical Dietitian Pager: [phone] After Hours Pager: [phone]

## 2016-02-28 NOTE — Progress Notes (Signed)
STROKE TEAM PROGRESS NOTE   HISTORY OF PRESENT ILLNESS (per record) [name] is an 75 y.o. male with a history of diabetes mellitus, hyperlipidemia and previous stroke presenting with new onset weakness and numbness involving right extremities as well as slight dizziness. Features also become more slurred. CT scan of his head was unremarkable. MRI showed acute left pontine infarction. Patient has been taking Plavix 75 mg per day. He has noticed slight worsening of dysphagia and some onset of his symptoms at about 10 AM today. Weakness and numbness involving right extremities has improved but still aren't back to baseline. NIH stroke score was 5. He was LKW at 10 AM on 02/27/2016. Patient was not administered IV t-PA secondary to being beyond time under for treatment consideration. He was admitted for further evaluation and treatment.   SUBJECTIVE (INTERVAL HISTORY) No family at bedside. Pt stated that he is back to his baseline. Occasional choking which has been since his last stroke in 01/2014.    OBJECTIVE Temp:  [98 F (36.7 C)-98.3 F (36.8 C)] 98.1 F (36.7 C) (06/05 1120) Pulse Rate:  [63-75] 65 (06/04 2335) Cardiac Rhythm:  [-] Other (Comment) (06/05 0700) Resp:  [12-27] 20 (06/05 1120) BP: (110-173)/(54-84) 110/62 mmHg (06/05 1120) SpO2:  [93 %-100 %] 100 % (06/05 1120) Weight:  [72.576 kg (160 lb)] 72.576 kg (160 lb) (06/04 1737)  CBC:  Recent Labs Lab 02/27/16 1744 02/28/16 0255  WBC 6.1 5.0  NEUTROABS 3.9  --   HGB 11.2* 10.0*  HCT 34.7* 31.1*  MCV 90.6 90.7  PLT 228 [id number]    Basic Metabolic Panel:  Recent Labs Lab 02/27/16 1744 02/28/16 0255  NA 139 138  K 4.8 4.4  CL 109 110  CO2 24 23  GLUCOSE 117* 210*  BUN 25* 23*  CREATININE 1.43* 1.34*  CALCIUM 9.2 8.3*    Lipid Panel:    Component Value Date/Time   CHOL 97 02/28/2016 0255   TRIG 92 02/28/2016 0255   HDL 28* 02/28/2016 0255   CHOLHDL 3.5 02/28/2016 0255   VLDL 18 02/28/2016 0255   LDLCALC 51  02/28/2016 0255   HgbA1c:  Lab Results  Component Value Date   HGBA1C 9.0* 01/24/2014   Urine Drug Screen:    Component Value Date/Time   LABOPIA NONE DETECTED 02/27/2016 1945   COCAINSCRNUR NONE DETECTED 02/27/2016 1945   LABBENZ NONE DETECTED 02/27/2016 1945   AMPHETMU NONE DETECTED 02/27/2016 1945   THCU NONE DETECTED 02/27/2016 1945   LABBARB NONE DETECTED 02/27/2016 1945      IMAGING I have personally reviewed the radiological images below and agree with the radiology interpretations.  Ct Head Wo Contrast 02/27/2016  No evidence of acute intracranial abnormality. Mild chronic small-vessel white matter ischemic changes.   [name] (neuro Protocol) 02/27/2016  1. Acute lacunar infarcts in the left pons. No associated hemorrhage or mass effect. 2. Underlying advanced chronic small vessel disease with progression with other progression in the brain stem and right thalamus since 2015.   CTA head and neck  Atherosclerosis of the aortic arch. 30% stenosis of the proximal left subclavian artery. Atherosclerosis of both carotid bifurcations. 30% stenosis of the right external carotid artery. No measurable stenosis of either internal carotid artery. Both vertebral arteries widely patent at the origin and through the neck. Atherosclerotic calcification in both carotid siphon regions but without stenosis greater than 50% on either side. No more distal anterior circulation disease demonstrated. No correctable intracranial posterior circulation disease.  Mild atherosclerotic irregularity of the distal vertebral arteries and the basilar artery without a measurable focal stenosis. No missing posterior circulation branch vessels.  TTE 12/13/15 - Left ventricle: The cavity size was normal. Wall thickness was  increased in a pattern of mild LVH. There was mild focal basal  hypertrophy of the septum. Systolic function was moderately  reduced. The estimated ejection fraction  was in the range of 35%  to 40%. Diffuse hypokinesis. Doppler parameters are consistent  with abnormal left ventricular relaxation (grade 1 diastolic  dysfunction). Doppler parameters are consistent with high  ventricular filling pressure. - Ventricular septum: Septal motion showed abnormal function and  dyssynergy. - Mitral valve: Calcified annulus. - Left atrium: The atrium was mildly dilated. - Atrial septum: There was an atrial septal aneurysm. Impressions: - Moderate global reduction in LV function (EF 40); mild LVH; grade  1 diastolic dysfunction with elevated LV filling pressure; mild  LAE; trace MR.   PHYSICAL EXAM General - Well nourished, well developed, in no apparent distress.  Ophthalmologic - Fundi not visualized due to noncooperation.  Cardiovascular - Regular rate and rhythm.  Mental Status -  Level of arousal and orientation to time, place, and person were intact. Language including expression, naming, repetition, comprehension was assessed and found intact. Fund of Knowledge was assessed and was intact.  Cranial Nerves II - XII - II - Visual field intact OU. III, IV, VI - Extraocular movements intact. V - Facial sensation intact bilaterally. VII - Facial movement intact bilaterally. VIII - Hearing & vestibular intact bilaterally. X - Palate elevates symmetrically. XI - Chin turning & shoulder shrug intact bilaterally. XII - Tongue protrusion intact.  Motor Strength - The patient's strength was normal in all extremities and pronator drift was absent.  Bulk was normal and fasciculations were absent.   Motor Tone - Muscle tone was assessed at the neck and appendages and was normal.  Reflexes - The patient's reflexes were 1+ in all extremities and he had no pathological reflexes.  Sensory - Light touch, temperature/pinprick were assessed and were symmetrical.    Coordination - The patient had normal movements in the hands and feet with no ataxia or  dysmetria.  Tremor was absent.  Gait and Station - deferred    ASSESSMENT/PLAN Mr. [name] is a 75 y.o. male with history of CVA, type 2 diabetes, hyperlipidemia, seasonal allergy, tobacco use disorder, cataracts, diverticulosis, polyposis coli presenting with right sided weakness. He did not receive IV t-PA due to delay in arrival.   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 small vessel disease   CTA H&N  unremarkable  2D Echo  11/2015 EF 35-40%   LDL 51  HgbA1c pending  Lovenox 40 mg sq daily for VTE prophylaxis  Diet Carb Modified Fluid consistency:: Thin; Room service appropriate?: Yes  clopidogrel 75 mg daily prior to admission, now on ASA 325mg . Continue ASA on discharge.  Patient counseled to be compliant with his antithrombotic medications  Ongoing aggressive stroke risk factor management  Therapy recommendations:  pending   Disposition:  pending   Hypertension  Stable Permissive hypertension (OK if < 220/120) but gradually normalize in 5-7 days  Hyperlipidemia  Home meds:  zocor 40  resumed zocor in hospital  LDL 51, at goal < 70  Continue statin at discharge  Diabetes  HgbA1c pending, goal < 7.0  SSI  CBG monitoring  Tobacco abuse  Current smoker  Smoking cessation counseling provided  Pt is willing to quit  Other Stroke Risk Factors  Advanced age  Hx stroke/TIA  01/2014 right cerebellar peduncle infarct secondary to small vessel disease  Remote Left pontine and periventricular white matter infarcts  Coronary artery disease  Chronic systolic CHF  Other Active Problems  Anemia  Hospital day # 1  Neurology will sign off. Please call with questions. Pt will follow up with Cecille Rubin NP at [facility] in about 2 months. Thanks for the consult.  Rosalin Hawking, MD PhD Stroke Neurology 02/29/2016 12:38 AM     To contact Stroke Continuity provider,  please refer to [email or web address] After hours, contact General Neurology

## 2016-02-29 ENCOUNTER — Inpatient Hospital Stay (HOSPITAL_COMMUNITY): Payer: Medicare Other

## 2016-02-29 DIAGNOSIS — I6789 Other cerebrovascular disease: Secondary | ICD-10-CM

## 2016-02-29 LAB — GLUCOSE, CAPILLARY
Glucose-Capillary: 182 mg/dL — ABNORMAL HIGH (ref 65–99)
Glucose-Capillary: 165 mg/dL — ABNORMAL HIGH (ref 65–99)
Glucose-Capillary: 294 mg/dL — ABNORMAL HIGH (ref 65–99)

## 2016-02-29 LAB — ECHOCARDIOGRAM COMPLETE
Height: 70 in
Weight: 2761.92 oz

## 2016-02-29 LAB — HEMOGLOBIN A1C
Hgb A1c MFr Bld: 7.4 % — ABNORMAL HIGH (ref 4.8–5.6)
Mean Plasma Glucose: 166 mg/dL

## 2016-02-29 MED ORDER — ASPIRIN EC 325 MG PO TBEC
325.0000 mg | DELAYED_RELEASE_TABLET | Freq: Every day | ORAL | Status: DC
  Administered 2016-02-29: 325 mg via ORAL
  Filled 2016-02-29: qty 1

## 2016-02-29 MED ORDER — GLUCERNA SHAKE PO LIQD
237.0000 mL | ORAL | Status: DC
Start: 2016-02-29 — End: 2016-04-28

## 2016-02-29 MED ORDER — NIFEREX PO TABS: 150.0000 mg | ORAL_TABLET | Freq: Every day | ORAL | Status: DC

## 2016-02-29 MED ORDER — ASPIRIN 325 MG PO TBEC
325.0000 mg | DELAYED_RELEASE_TABLET | Freq: Every day | ORAL | Status: DC
Start: 2016-02-29 — End: 2016-04-28

## 2016-02-29 MED ORDER — SIMVASTATIN 40 MG PO TABS
40.0000 mg | ORAL_TABLET | Freq: Every day | ORAL | Status: DC
  Administered 2016-02-29: 40 mg via ORAL
  Filled 2016-02-29: qty 1

## 2016-02-29 NOTE — Progress Notes (Signed)
Pt discharged home with spouse. IV removed. Telemetry discontinued. Discharge information given. Pt to leave unit via wheelchair with staff. Wendee Copp

## 2016-02-29 NOTE — Progress Notes (Signed)
OT Cancellation Note  Patient Details Name: [name] MRN: [id number] DOB: 1941-05-30   Cancelled Treatment:    Reason Eval/Treat Not Completed: Other (comment) (Pt with SLP.)  Benito Mccreedy OTR/L [id number] 02/29/2016, 11:51 AM

## 2016-02-29 NOTE — Discharge Summary (Signed)
Physician Discharge Summary  [name] [id number] DOB: 06/17/1941 DOA: 02/27/2016  PCP: Tivis Ringer, MD  Admit date: 02/27/2016 Discharge date: 02/29/2016  Time spent: 35 minutes  Recommendations for Outpatient Follow-up:  Repeat BMET to follow electrolytes and renal function Follow BP and adjust medications as needed   Discharge Diagnoses:  Principal Problem:   Acute CVA (cerebrovascular accident) ([name]) Active Problems:   Hyperlipidemia   CAD, NATIVE VESSEL   Essential hypertension   Chronic systolic CHF (congestive heart failure) ([place])   DM2 (diabetes mellitus, type 2) ([place])   Anemia   Tobacco use disorder   Discharge Condition: stable and improved. Discharge home with outpatient referral for physical therapy. Follow up with PCP in 2 weeks and follow up with neurology service in 2 months.   Diet recommendation: heart healthy and low carbohydrates diet   Filed Weights   02/27/16 1737 02/29/16 0424 02/29/16 0522  Weight: 72.576 kg (160 lb) 78.4 kg (172 lb 13.5 oz) 78.3 kg (172 lb 9.9 oz)    History of present illness:  75 y.o. male with medical history significant of CVA, type 2 diabetes, hyperlipidemia, seasonal allergy, tobacco use disorder, cataracts, diverticulosis, polyposis coli who comes in the emergency department due to right-sided weakness since 10 AM. Per patient, he woke up around 6:30 in the morning and was feeling normal. Around 10 AM, he went outside his yard and started experiencing numbness/ weakness of the right sided extremities, gait disturbance and slurred speech. Since then, his symptoms have improved, but still persists. Per patient's wife, his speech is close to baseline, but still mildly slurred. The patient came into the emergency department about 7-1/2 hours   Hospital Course:  Acute CVA (cerebrovascular accident) [facility]) Following neurology rec's will discharge on aspirin and plavix for 3 months; following most likely plavix for long term  secondary prevention. Continue risk factors modifications Advise to quit smoking  No acute source of emboli on echo or CT angio head/neck   Hyperlipidemia On simvastatin; LDL stable and at goal.   CAD, NATIVE VESSEL Continue statins, plavix and b-blockers  No CP or SOB   Essential hypertension Continue carvedilol, furosemide, losartan, spironolactone  BP is fairly well controlled Encourage low sodium diet    Chronic systolic CHF (congestive heart failure) ([place]) Compensated currently  Continue carvedilol, furosemide, losartan, spironolactone and potassium supplementation. Daily weights Low sodium diet Repeat echo unchanged  Will continue outpatient follow up with cardiology service    DM2 (diabetes mellitus, type 2) ([place]) Carbohydrate modified diet encourage Resume metformin and Amaryl at discharge A1C 7.4 (goal is less than 7.0)   Anemia Anemia panel demonstrating iron deficiency anemia (ferritin 18)  Hgb 10.0 No overt bleeding appreciated At discharge will start iron supplementation (discharge on niferex daily)    Tobacco use disorder Declined nicotine replacement therapy. Cessation counseling provided  Patient will try to quit  Procedures:  MRI: with Acute lacunar infarcts in the left pons. No associated hemorrhage or mass effect.  2-D echo: - Left ventricle: The cavity size was normal. Wall thickness was  increased in a pattern of mild LVH. There was focal basal  hypertrophy. Systolic function was moderately reduced. The  estimated ejection fraction was in the range of 35% to 40%.  Doppler parameters are consistent with abnormal left ventricular  relaxation (grade 1 diastolic dysfunction). - Aortic valve: There was trivial regurgitation. - Mitral valve: There was mild regurgitation. - Left atrium: The atrium was mildly dilated. - Pericardium, extracardiac: A trivial pericardial effusion  was  identified.   CT head and neck: unremarkable    Consultations:  Neurology   Discharge Exam: Filed Vitals:   02/29/16 1036 02/29/16 1333  BP: 151/52 160/59  Pulse: 60 65  Temp: 98 F (36.7 C) 98.4 F (36.9 C)  Resp: 18 18    General: Afebrile, no chest pain, no shortness of breath. Patient without dysarthria, oriented 3 and able to follow commands.  Cardiovascular: S1 and S2, no rubs, no gallops  Respiratory: Good air movement bilaterally, no wheezing, scattered rhonchi  Abdomen: Soft, nontender, nondistended, positive bowel sounds  Musculoskeletal: No edema, no cyanosis or clubbing  neurologic exam: Cranial nerves grossly intact, no dysarthria, apraxia or focal motor deficit. Patient endorses some numbness sensation of his right upper extremity and also mild weakness on his right lower extremity (muscle strength appears to be 5 out of 5 bilaterally).   Discharge Instructions   Discharge Instructions    Ambulatory referral to Neurology    Complete by:  As directed   Follow up with Cecille Rubin, NP, at [facility] in about 2 months. Thanks.     Ambulatory referral to Occupational Therapy    Complete by:  As directed      Ambulatory referral to Physical Therapy    Complete by:  As directed      Diet - low sodium heart healthy    Complete by:  As directed      Discharge instructions    Complete by:  As directed   Take medications as prescribed Follow low sodium and low carbohydrates diet Check weight on daily basis Stop smoking Follow up with PCP in 2 weeks Follow up with neurology as instructed          Current Discharge Medication List    START taking these medications   Details  aspirin EC 325 MG EC tablet Take 1 tablet (325 mg total) by mouth daily. Qty: 30 tablet, Refills: 3    feeding supplement, GLUCERNA SHAKE, (GLUCERNA SHAKE) LIQD Take 237 mLs by mouth daily.    Iron Combinations (NIFEREX) TABS Take 150 mg by mouth daily. Qty: 30 tablet, Refills: 3      CONTINUE these medications which have NOT  CHANGED   Details  carvedilol (COREG) 12.5 MG tablet Take one tablet by mouth daily Qty: 90 tablet, Refills: 3   Associated Diagnoses: Essential hypertension; Chronic systolic congestive heart failure (HCC)    clopidogrel (PLAVIX) 75 MG tablet Take 1 tablet (75 mg total) by mouth daily with breakfast. Qty: 90 tablet, Refills: 1    furosemide (LASIX) 40 MG tablet Take 1 tablet (40 mg total) by mouth daily. Qty: 90 tablet, Refills: 3   Associated Diagnoses: Essential hypertension; Chronic systolic congestive heart failure (HCC)    glimepiride (AMARYL) 1 MG tablet Take 1 mg by mouth 2 (two) times daily.    losartan (COZAAR) 100 MG tablet Take 1 tablet (100 mg total) by mouth daily. Qty: 30 tablet, Refills: 0    metFORMIN (GLUCOPHAGE) 1000 MG tablet Take 1,000 mg by mouth 2 (two) times daily.     montelukast (SINGULAIR) 10 MG tablet Take 10 mg by mouth every morning.     potassium chloride SA (K-DUR,KLOR-CON) 20 MEQ tablet Take 0.5 tablets (10 mEq total) by mouth daily. Qty: 90 tablet, Refills: 3   Associated Diagnoses: Essential hypertension; Chronic systolic congestive heart failure (HCC)    simvastatin (ZOCOR) 40 MG tablet Take 40 mg by mouth daily.     spironolactone (ALDACTONE)  25 MG tablet Take 0.5 tablets (12.5 mg total) by mouth daily. Qty: 90 tablet, Refills: 1   Associated Diagnoses: LBBB (left bundle branch block); Chronic systolic CHF (congestive heart failure) (HCC)       No Known Allergies Follow-up Information    Follow up with Dennie Bible, NP. Schedule an appointment as soon as possible for a visit in 2 months.   Specialty:  Family Medicine   Why:  stroke clinic   Contact information:   [address] [place] [address] [phone]       Follow up with [facility].   Specialty:  Rehabilitation   Why:  They will contact you to set up the first appointment.   Contact information:   [address] [place] [id number] [place] [id number] [phone]      Follow up with Tivis Ringer, MD. Schedule an appointment as soon as possible for a visit in 2 weeks.   Specialty:  Internal Medicine   Contact information:   [place] [address] [phone]       The results of significant diagnostics from this hospitalization (including imaging, microbiology, ancillary and laboratory) are listed below for reference.    Significant Diagnostic Studies: Ct Angio Head W/cm &/or Wo Cm  02/28/2016  CLINICAL DATA:  Acute presentation yesterday with right-sided weakness. Acute infarction left pons. EXAM: CT ANGIOGRAPHY HEAD AND NECK TECHNIQUE: Multidetector CT imaging of the head and neck was performed using the standard protocol during bolus administration of intravenous contrast. Multiplanar CT image reconstructions and MIPs were obtained to evaluate the vascular anatomy. Carotid stenosis measurements (when applicable) are obtained utilizing NASCET criteria, using the distal internal carotid diameter as the denominator. CONTRAST:  50 cc Isovue 370 COMPARISON:  MRI 02/27/2016.  CT 02/27/2016. FINDINGS: CT HEAD One can appreciate old small vessel infarctions in the pons. The acute left-sided pontine stroke cannot be specifically resolved. There is cerebellar atrophy. There is an old lacunar infarction in the right thalamus. Cerebral hemispheres show generalized atrophy. No sign of any new insult since yesterday's studies. No hemorrhage. No mass effect. No hydrocephalus or extra-axial collection. CTA NECK Aortic arch: Atherosclerosis of the arch but no aneurysm or dissection. Branching pattern of the brachiocephalic vessels from the arch is normal. 30% stenosis at the left subclavian artery origin. Right carotid system: Common carotid artery widely patent to the bifurcation. Atherosclerotic disease at the carotid bifurcation. 30% stenosis of the proximal external carotid  artery. No stenosis of the proximal internal carotid artery when compared to the diameter of the more distal cervical internal carotid artery. Left carotid system: Common carotid widely patent to the bifurcation with mild non stenotic atherosclerotic change. Mild atherosclerosis of the carotid bifurcation on the left but no stenosis of the proximal external or internal carotid artery. No cervical internal carotid stenosis beyond the bifurcation. Vertebral arteries:Right vertebral artery origin is widely patent. This vessel is widely patent through the cervical region. Left vertebral artery origin is widely patent. The vessel is tortuous proximally but widely patent through the cervical region. Skeleton: Chronic spondylosis with old fusion at C5-6. Other neck: No soft tissue mass or lymphadenopathy. Mild scarring and emphysema at the lung apices. CTA HEAD Anterior circulation: Both internal carotid arteries show atherosclerotic calcification in the siphon regions. Stenosis estimated at 50% bilaterally. Supraclinoid internal carotid arteries are widely patent. The anterior and middle cerebral vessels are patent without proximal stenosis, aneurysm or vascular malformation. Posterior circulation: Both  vertebral arteries are widely patent at the foramen magnum. Both vertebral arteries supply PICA and provide ongoing flow to the basilar. There is mild irregularity of the distal vertebral arteries but no stenosis. The basilar shows mild atherosclerotic irregularity but no stenosis. Superior cerebellar and posterior cerebral arteries are patent bilaterally, with patent posterior communicating arteries bilaterally. 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 Atherosclerosis of both carotid bifurcations. 30% stenosis of the right external carotid artery. No measurable  stenosis of either internal carotid artery. Both vertebral arteries widely patent at the origin and through the neck. Atherosclerotic calcification in both carotid siphon regions but without stenosis greater than 50% on either side. No more distal anterior circulation disease demonstrated. No correctable intracranial posterior circulation disease. Mild atherosclerotic irregularity of the distal vertebral arteries and the basilar artery without a measurable focal stenosis. No missing posterior circulation branch vessels. Electronically Signed   By: Nelson Chimes M.D.   On: 02/28/2016 19:16   Dg Chest 2 View  02/28/2016  CLINICAL DATA:  Wheezing EXAM: CHEST  2 VIEW COMPARISON:  01/23/2014 FINDINGS: Chronic elevation of the right diaphragm. Stable borderline cardiomegaly. Negative aortic and hilar contours. No edema, consolidation, effusion, or pneumothorax. IMPRESSION: Stable exam.  No evidence of acute disease. Electronically Signed   By: Monte Fantasia M.D.   On: 02/28/2016 00:02   Ct Head Wo Contrast  02/27/2016  CLINICAL DATA:  75 year old male with bilateral upper and lower extremity weakness, blurred vision and slurred speech. EXAM: CT HEAD WITHOUT CONTRAST TECHNIQUE: Contiguous axial images were obtained from the base of the skull through the vertex without intravenous contrast. COMPARISON:  01/24/2014 MR and prior studies FINDINGS: Mild chronic small-vessel white matter ischemic changes again noted. No acute intracranial abnormalities are identified, including mass lesion or mass effect, hydrocephalus, extra-axial fluid collection, midline shift, hemorrhage, or acute infarction. The visualized bony calvarium is unremarkable. IMPRESSION: No evidence of acute intracranial abnormality. Mild chronic small-vessel white matter ischemic changes. Electronically Signed   By: Margarette Canada M.D.   On: 02/27/2016 18:27   Ct Angio Neck W/cm &/or Wo/cm  02/28/2016  CLINICAL DATA:  Acute presentation yesterday with  right-sided weakness. Acute infarction left pons. EXAM: CT ANGIOGRAPHY HEAD AND NECK TECHNIQUE: Multidetector CT imaging of the head and neck was performed using the standard protocol during bolus administration of intravenous contrast. Multiplanar CT image reconstructions and MIPs were obtained to evaluate the vascular anatomy. Carotid stenosis measurements (when applicable) are obtained utilizing NASCET criteria, using the distal internal carotid diameter as the denominator. CONTRAST:  50 cc Isovue 370 COMPARISON:  MRI 02/27/2016.  CT 02/27/2016. FINDINGS: CT HEAD One can appreciate old small vessel infarctions in the pons. The acute left-sided pontine stroke cannot be specifically resolved. There is cerebellar atrophy. There is an old lacunar infarction in the right thalamus. Cerebral hemispheres show generalized atrophy. No sign of any new insult since yesterday's studies. No hemorrhage. No mass effect. No hydrocephalus or extra-axial collection. CTA NECK Aortic arch: Atherosclerosis of the arch but no aneurysm or dissection. Branching pattern of the brachiocephalic vessels from the arch is normal. 30% stenosis at the left subclavian artery origin. Right carotid system: Common carotid artery widely patent to the bifurcation. Atherosclerotic disease at the carotid bifurcation. 30% stenosis of the proximal external carotid artery. No stenosis of the proximal internal carotid artery when compared to the diameter of the  more distal cervical internal carotid artery. Left carotid system: Common carotid widely patent to the bifurcation with mild non stenotic atherosclerotic change. Mild atherosclerosis of the carotid bifurcation on the left but no stenosis of the proximal external or internal carotid artery. No cervical internal carotid stenosis beyond the bifurcation. Vertebral arteries:Right vertebral artery origin is widely patent. This vessel is widely patent through the cervical region. Left vertebral artery  origin is widely patent. The vessel is tortuous proximally but widely patent through the cervical region. Skeleton: Chronic spondylosis with old fusion at C5-6. Other neck: No soft tissue mass or lymphadenopathy. Mild scarring and emphysema at the lung apices. CTA HEAD Anterior circulation: Both internal carotid arteries show atherosclerotic calcification in the siphon regions. Stenosis estimated at 50% bilaterally. Supraclinoid internal carotid arteries are widely patent. The anterior and middle cerebral vessels are patent without proximal stenosis, aneurysm or vascular malformation. Posterior circulation: Both vertebral arteries are widely patent at the foramen magnum. Both vertebral arteries supply PICA and provide ongoing flow to the basilar. There is mild irregularity of the distal vertebral arteries but no stenosis. The basilar shows mild atherosclerotic irregularity but no stenosis. Superior cerebellar and posterior cerebral arteries are patent bilaterally, with patent posterior communicating arteries bilaterally. 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 Atherosclerosis of both carotid bifurcations. 30% stenosis of the right external carotid artery. No measurable stenosis of either internal carotid artery. Both vertebral arteries widely patent at the origin and through the neck. Atherosclerotic calcification in both carotid siphon regions but without stenosis greater than 50% on either side. No more distal anterior circulation disease demonstrated. No correctable intracranial posterior circulation disease. Mild atherosclerotic irregularity of the distal vertebral arteries and the basilar artery without a measurable focal stenosis. No missing posterior circulation branch vessels. Electronically Signed   By: Nelson Chimes M.D.   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 Upper and lower extremity weakness, blurred vision and slurred speech. Initial encounter. 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 EXAM: MRI HEAD WITHOUT CONTRAST TECHNIQUE: Multiplanar, multiecho pulse sequences of the brain and surrounding structures were obtained without intravenous contrast. COMPARISON:  Head CT without contrast 02/27/2016. Brain MRI 01/24/2014. FINDINGS: Major intracranial vascular flow voids are stable. Linear and nodular restricted diffusion in the left paracentral pons (series 4, image 17) with no associated hemorrhage or mass effect. No other restricted diffusion identified. Extensive chronic small vessel ischemia in the brainstem with other progression since 2015 - including new chronic lacunes the central and right paracentral pons. Expected evolution of the right middle cerebellar peduncle lacune seen on the prior study. A chronic lacune in the right thalamus is new since 2015. Occasional chronic cerebral white matter lacunar infarcts appear stable. No supratentorial cortical encephalomalacia. No chronic hemorrhage identified. No midline shift, mass effect, evidence of mass lesion, ventriculomegaly, extra-axial collection or acute intracranial hemorrhage. Cervicomedullary junction and pituitary are within normal limits. Negative visualized cervical spine. Visible internal auditory structures appear normal. Stable and relatively well pneumatized paranasal sinuses and mastoids. Postoperative changes to both globes. Negative orbit and scalp soft tissues otherwise. Bone marrow signal is stable and within normal limits. IMPRESSION: 1. Acute lacunar infarcts in the left pons. No associated hemorrhage or mass effect. 2. Underlying advanced chronic small vessel  disease with progression with other progression in the brain stem and right thalamus  since 2015. Electronically Signed   By: Genevie Ann M.D.   On: 02/27/2016 21:39   Labs: Basic Metabolic Panel:  Recent Labs Lab 02/27/16 1744 02/28/16 0255  NA 139 138  K 4.8 4.4  CL 109 110  CO2 24 23  GLUCOSE 117* 210*  BUN 25* 23*  CREATININE 1.43* 1.34*  CALCIUM 9.2 8.3*   Liver Function Tests:  Recent Labs Lab 02/27/16 1744 02/28/16 0255  AST 19 18  ALT 15* 14*  ALKPHOS 62 55  BILITOT 0.5 0.6  PROT 6.1* 5.5*  ALBUMIN 3.1* 3.0*   CBC:  Recent Labs Lab 02/27/16 1744 02/28/16 0255  WBC 6.1 5.0  NEUTROABS 3.9  --   HGB 11.2* 10.0*  HCT 34.7* 31.1*  MCV 90.6 90.7  PLT 228 210   Cardiac Enzymes:  Recent Labs Lab 02/27/16 1744  TROPONINI <0.03   CBG:  Recent Labs Lab 02/28/16 1635 02/28/16 2133 02/29/16 0521 02/29/16 1148 02/29/16 1630  GLUCAP 137* 173* 182* 165* 294*    Signed:  Barton Dubois MD.  [facility] 02/29/2016, 5:26 PM

## 2016-02-29 NOTE — Progress Notes (Signed)
  Echocardiogram 2D Echocardiogram has been performed.  Diamond Nickel 02/29/2016, 9:16 AM

## 2016-02-29 NOTE — Evaluation (Signed)
Speech Language Pathology Evaluation Patient Details Name: [name] MRN: [id number] DOB: 03-17-1941 Today's Date: 02/29/2016 Time: [id number] SLP Time Calculation (min) (ACUTE ONLY): 46 min  Problem List:  Patient Active Problem List   Diagnosis Date Noted  . Tobacco use disorder 02/28/2016  . Acute CVA (cerebrovascular accident) ([place]) 02/27/2016  . DM2 (diabetes mellitus, type 2) ([place]) 02/27/2016  . Anemia 02/27/2016  . Chronic systolic CHF (congestive heart failure) ([place]) 05/05/2015  . 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 Essential hypertension 07/16/2014  . Former smoker 04/21/2014  . Thrombotic stroke ([place]) 01/23/2014  . Dizziness 01/23/2014  . Diabetes ([place]) 01/23/2014  . Facial droop 01/23/2014  . Benign neoplasm of colon 08/25/2011  . CAD, NATIVE VESSEL 05/23/2010  . DIABETIC  RETINOPATHY 03/21/2010  . Hyperlipidemia 03/21/2010  . NEUROPATHY 03/21/2010  . HYPERTENSION 03/21/2010  . LBBB 03/21/2010  . 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 CERVICAL RADICULOPATHY 03/21/2010   Past Medical History:  Past Medical History  Diagnosis Date  . Diabetes mellitus   . Hyperlipidemia   . Allergy   . Cataract   . Colon polyps 2012    Colonoscopy  . Diverticulosis 2012    Colonoscopy    Past Surgical History:  Past Surgical History  Procedure Laterality Date  . Cataract extraction      both eyes  . Neck surgery    . Colon surgery      to repair intestines as child  . Knee arthroscopy      left  . Colonoscopy  10/20/2011    Procedure: COLONOSCOPY;  Surgeon: Inda Castle, MD;  Location: [facility];  Service: Endoscopy;  Laterality: N/A;  . Hot hemostasis  10/20/2011    Procedure: HOT HEMOSTASIS (ARGON PLASMA COAGULATION/BICAP);  Surgeon: Inda Castle, MD;  Location: [facility];  Service: Endoscopy;  Laterality: N/A;   HPI:  [name] is an 75 y.o. male with a history of diabetes mellitus, hyperlipidemia and  previous stroke presenting with new onset weakness and numbness involving right extremities as well as slight dizziness. Features also become more slurred. CT scan of his head was unremarkable. MRI showed acute left pontine infarction. Patient has been taking Plavix 75 mg per day. He has noticed slight worsening of dysphagia and some onset of his symptoms at about 10 AM today. Weakness and numbness involving right extremities has improved but still aren't back to baseline. NIH stroke score was 5. He was LKW at 10 AM on 02/27/2016. Patient was not administered IV t-PA secondary to being beyond time under for treatment consideration. He was admitted for further evaluation and treatment.   Assessment / Plan / Recommendation Clinical Impression  Pt seen for speech language assessment. Pt quite conversant and pleasant and denied any cognitive-linguistic deficits. Pt given the [facility] Cognitive Assessment scoring 27/30 points which is consistent WNL function. Pt did demonstrate difficulty with sentence level reading and spelling at the word level, however pt reports that this is his baseline function. Encouraged pt to complete complex cognitive-linguistic tasks such as medication and financial management with his spouse present as a precaution. Pt agreed with recommendation. No further SLP services indicated at this time.    SLP Assessment  Patient does not need any further Speech Lanaguage Pathology Services    Follow Up Recommendations       Frequency and Duration           SLP Evaluation Prior Functioning  Cognitive/Linguistic Baseline: Information not available  Type of Home: House  Lives With: Spouse Vocation: Retired   Associate Professor  Overall Cognitive Status: Within Functional Limits for tasks assessed Arousal/Alertness: Awake/alert Orientation Level: Oriented X4    Comprehension  Auditory Comprehension Overall Auditory Comprehension: Appears within functional limits for tasks  assessed Visual Recognition/Discrimination Discrimination: Within Function Limits Reading Comprehension Reading Status: Impaired Word level: Within functional limits Sentence Level: Impaired    Expression Expression Primary Mode of Expression: Verbal Verbal Expression Overall Verbal Expression: Appears within functional limits for tasks assessed Written Expression Dominant Hand: Right Written Expression: Exceptions to [facility] Dictation Ability: Word   Oral / Motor  Oral Motor/Sensory Function Overall Oral Motor/Sensory Function: Within functional limits Motor Speech Overall Motor Speech: Appears within functional limits for tasks assessed   [place] MA, [place] Pager [phone] 02/29/2016, 12:09 PM

## 2016-02-29 NOTE — Progress Notes (Signed)
Physical Therapy Treatment Patient Details Name: [name] MRN: [id number] DOB: 1941/02/22 Today's Date: 02/29/2016    History of Present Illness 75 y.o. male admitted with a CVA which started while he was outside working in the yard and he noticed having Rt sided weakness. PMH: CVA, type 2 diabetes, hyperlipidemia, seasonal allergy, tobacco use disorder, cataracts, diverticulosis, polyposis coli.     PT Comments    Patient is making progress toward mobility goals however continues to demo balance deficits. DGI score of 17 indicative of falls. Continue to recommend OPPT for further skilled PT services to maximize independence and safety with mobility.   Follow Up Recommendations  Outpatient PT;Supervision for mobility/OOB (pt declinced HHPT)     Equipment Recommendations  None recommended by PT    Recommendations for Other Services       Precautions / Restrictions Precautions Precautions: Fall Restrictions Weight Bearing Restrictions: No    Mobility  Bed Mobility               General bed mobility comments: up in chair upon arrival  Transfers Overall transfer level: Needs assistance Equipment used: None   Sit to Stand: Supervision         General transfer comment: supervision for safety; pt a little impulsive to stand; no unsteadiness noted upon standing X 2 from recliner  Ambulation/Gait Ambulation/Gait assistance: Min guard Ambulation Distance (Feet): 250 Feet Assistive device: None Gait Pattern/deviations: Step-through pattern     General Gait Details: unsteadiness noted; grossly min guard with min A X1 for recover of LOB   Stairs Stairs: Yes Stairs assistance: Min guard Stair Management: One rail Right;Forwards Number of Stairs: 10 General stair comments: cues for safety awareness; min guard for safety  Wheelchair Mobility    Modified Rankin (Stroke Patients Only) Modified Rankin (Stroke Patients Only) Pre-Morbid Rankin Score: No  symptoms Modified Rankin: Moderately severe disability     Balance Overall balance assessment: Needs assistance   Sitting balance-Leahy Scale: Good       Standing balance-Leahy Scale: Good                      Cognition Arousal/Alertness: Awake/alert Behavior During Therapy: WFL for tasks assessed/performed Overall Cognitive Status: Within Functional Limits for tasks assessed                      Exercises      General Comments        Pertinent Vitals/Pain Pain Assessment: No/denies pain    Home Living                      Prior Function            PT Goals (current goals can now be found in the care plan section) Acute Rehab PT Goals Patient Stated Goal: go home PT Goal Formulation: With patient Time For Goal Achievement: 03/13/16 Potential to Achieve Goals: Good Progress towards PT goals: Progressing toward goals    Frequency  Min 4X/week    PT Plan Current plan remains appropriate    Co-evaluation             End of Session Equipment Utilized During Treatment: Gait belt Activity Tolerance: Patient tolerated treatment well Patient left: in chair;with call bell/phone within reach;with chair alarm set;with family/visitor present     Time: [id number] PT Time Calculation (min) (ACUTE ONLY): 19 min  Charges:  $Gait Training: 8-22 mins  G Codes:      [place] April, PTA Pager: [phone]   02/29/2016, 9:58 AM

## 2016-02-29 NOTE — Progress Notes (Signed)
Occupational Therapy Treatment Patient Details Name: [name] MRN: [id number] DOB: Feb 03, 1941 Today's Date: 02/29/2016    History of present illness 75 y.o. male admitted with a CVA which started while he was outside working in the yard and he noticed having Rt sided weakness. PMH: CVA, type 2 diabetes, hyperlipidemia, seasonal allergy, tobacco use disorder, cataracts, diverticulosis, polyposis coli.    OT comments  Education provided in session and handout provided. Continue to recommend Outpatient OT.   Follow Up Recommendations  Outpatient OT;Supervision/Assistance - 24 hour;Other (comment) (full visual field assessment)    Equipment Recommendations  None recommended by OT    Recommendations for Other Services      Precautions / Restrictions Precautions Precautions: Fall Restrictions Weight Bearing Restrictions: No       Mobility Bed Mobility Overal bed mobility: Modified Independent                Transfers Overall transfer level: Needs assistance   Transfers: Sit to/from Stand Sit to Stand: Supervision              Balance      Min guard for ambulation. Able to retrieve items out of closet from low surface without LOB.                             ADL Overall ADL's : Needs assistance/impaired                         Toilet Transfer: Min guard;Ambulation (sit to stand from bed)           Functional mobility during ADLs: Min guard General ADL Comments: Educated on BE FAST stroke education and recommended pt to stop smoking.       Vision  see exercise section                   Perception     Praxis      Cognition  Awake/Alert Behavior During Therapy: WFL for tasks assessed/performed Overall Cognitive Status: No family/caregiver present to determine baseline cognitive functioning       Memory: Decreased short-term memory               Extremity/Trunk Assessment               Exercises  Other Exercises Other Exercises: educated on gross motor coordination activities and pt went and retrieved items to fold and folded them. He also tossed object back and forth with therapist. He also tossed object back and forth with himself while counting, which was difficult for him.  Other Exercises: pt did vision tracking exercise and missed some letters. Educated pt on vision strategies. Reports difficulty reading at baseline.   Shoulder Instructions       General Comments      Pertinent Vitals/ Pain       Pain Assessment: Faces Faces Pain Scale: No hurt  Home Living       Type of Home: House                              Lives With: Spouse    Prior Functioning/Environment              Frequency Min 2X/week     Progress Toward Goals  OT Goals(current goals can now be found in the care plan section)  Progress towards  OT goals: Progressing toward goals  Acute Rehab OT Goals Patient Stated Goal: go home OT Goal Formulation: With patient Time For Goal Achievement: 03/06/16 Potential to Achieve Goals: Good ADL Goals Pt Will Perform Lower Body Dressing: with modified independence;sit to/from stand (including going to gather items from low surface) Pt Will Transfer to Toilet: with modified independence;ambulating;regular height toilet Additional ADL Goal #1: Pt will independently perform HEP for bilateral UEs to increase gross motor coordination. Additional ADL Goal #2: Pt will participate in vision activities.   Plan Discharge plan remains appropriate    Co-evaluation                 End of Session     Activity Tolerance Patient tolerated treatment well   Patient Left in bed;with call bell/phone within reach;with bed alarm set   Nurse Communication Other (comment) (Outpatient OT; wants drink in room)        Time: [id number] OT Time Calculation (min): 19 min  Charges: OT General Charges $OT Visit: 1 Procedure OT  Treatments $Therapeutic Activity: 8-22 mins  Benito Mccreedy OTR/L [id number] 02/29/2016, 3:20 PM

## 2016-03-01 ENCOUNTER — Other Ambulatory Visit (INDEPENDENT_AMBULATORY_CARE_PROVIDER_SITE_OTHER): Payer: Medicare Other | Admitting: *Deleted

## 2016-03-01 DIAGNOSIS — I5022 Chronic systolic (congestive) heart failure: Secondary | ICD-10-CM | POA: Diagnosis not present

## 2016-03-01 LAB — BASIC METABOLIC PANEL
BUN: 26 mg/dL — ABNORMAL HIGH (ref 7–25)
CO2: 20 mmol/L (ref 20–31)
Calcium: 8.3 mg/dL — ABNORMAL LOW (ref 8.6–10.3)
Chloride: 110 mmol/L (ref 98–110)
Creat: 1.32 mg/dL — ABNORMAL HIGH (ref 0.70–1.18)
Glucose, Bld: 195 mg/dL — ABNORMAL HIGH (ref 65–99)
Potassium: 4.5 mmol/L (ref 3.5–5.3)
Sodium: 141 mmol/L (ref 135–146)

## 2016-03-13 ENCOUNTER — Ambulatory Visit: Payer: Medicare Other | Admitting: Occupational Therapy

## 2016-03-13 ENCOUNTER — Ambulatory Visit: Payer: Medicare Other | Attending: Internal Medicine | Admitting: Rehabilitation

## 2016-03-13 ENCOUNTER — Encounter: Payer: Self-pay | Admitting: Rehabilitation

## 2016-03-13 DIAGNOSIS — R278 Other lack of coordination: Secondary | ICD-10-CM

## 2016-03-13 DIAGNOSIS — I69318 Other symptoms and signs involving cognitive functions following cerebral infarction: Secondary | ICD-10-CM | POA: Insufficient documentation

## 2016-03-13 DIAGNOSIS — I69351 Hemiplegia and hemiparesis following cerebral infarction affecting right dominant side: Secondary | ICD-10-CM | POA: Diagnosis present

## 2016-03-13 DIAGNOSIS — M6281 Muscle weakness (generalized): Secondary | ICD-10-CM | POA: Insufficient documentation

## 2016-03-13 DIAGNOSIS — R2681 Unsteadiness on feet: Secondary | ICD-10-CM

## 2016-03-13 DIAGNOSIS — R2689 Other abnormalities of gait and mobility: Secondary | ICD-10-CM

## 2016-03-13 NOTE — Therapy (Signed)
[facility] [address] [place], [place], [address] Phone: [phone]   Fax:  [phone]  Physical Therapy Evaluation  Patient Details  Name: [name] MRN: [id number] Date of Birth: 09/28/1940 Referring Provider: Prince Solian, MD  Encounter Date: 03/13/2016      PT End of Session - 03/13/16 0930    Visit Number 1   Number of Visits 7   Date for PT Re-Evaluation 04/27/16   Authorization Type [employer] [place] code on every 10th visit   PT Start Time 0845   PT Stop Time 0928   PT Time Calculation (min) 43 min   Activity Tolerance Patient tolerated treatment well   Behavior During Therapy [facility] for tasks assessed/performed      Past Medical History  Diagnosis Date  . Diabetes mellitus   . Hyperlipidemia   . Allergy   . Cataract   . Colon polyps 2012    Colonoscopy  . Diverticulosis 2012    Colonoscopy     Past Surgical History  Procedure Laterality Date  . Cataract extraction      both eyes  . Neck surgery    . Colon surgery      to repair intestines as child  . Knee arthroscopy      left  . Colonoscopy  10/20/2011    Procedure: COLONOSCOPY;  Surgeon: Inda Castle, MD;  Location: [facility];  Service: Endoscopy;  Laterality: N/A;  . Hot hemostasis  10/20/2011    Procedure: HOT HEMOSTASIS (ARGON PLASMA COAGULATION/BICAP);  Surgeon: Inda Castle, MD;  Location: [facility];  Service: Endoscopy;  Laterality: N/A;    There were no vitals filed for this visit.       Subjective Assessment - 03/13/16 0852    Subjective "I had a stroke and the doctor recommended it."    Limitations Walking;House hold activities   Patient Stated Goals "To get my strength back on the right side and get right in my mind."    Currently in Pain? No/denies   Multiple Pain Sites No            [facility] PT Assessment - 03/13/16 0853    Assessment   Medical Diagnosis L CVA   Referring Provider Prince Solian, MD   Onset Date/Surgical Date 02/27/16   Hand Dominance Right   Precautions   Precautions Fall   Restrictions   Weight Bearing Restrictions No   Balance Screen   Has the patient fallen in the past 6 months Yes   How many times? 5   Has the patient had a decrease in activity level because of a fear of falling?  No   Is the patient reluctant to leave their home because of a fear of falling?  No   Home Social worker Private residence   Living Arrangements Spouse/significant other   Available Help at Discharge Family   Type of [place] to enter   Entrance Stairs-Number of Steps 7   Entrance Stairs-Rails Right;Left;Can reach both   [place] Two level;Laundry or work area in basement  has room, Banker, den Museum/gallery conservator of Steps 7   Alternate Level Stairs-Rails Right   Chief Financial Officer seat;Hand held shower head;Grab bars - tub/shower  walk in shower   Prior Function   Level of [place] Retired   Leisure travel (inside and outside of country)   Associate Professor  Overall Cognitive Status Impaired/Different from baseline   Sensation   Light Touch Impaired by gross assessment  R side duller than L   Additional Comments Pt reports intermittent numbness/tingling in R foot and toes   Coordination   Gross Motor Movements are Fluid and Coordinated Yes  in LEs   Fine Motor Movements are Fluid and Coordinated Yes  in LEs   ROM / Strength   AROM / PROM / Strength Strength   Strength   Overall Strength Deficits   Overall Strength Comments R LE grossly 4/5 (esp at hip flex), LLE 5/5   Transfers   Transfers Sit to Stand;Stand to Sit   Sit to Stand 6: Modified independent (Device/Increase time)   Five time sit to stand comments  8.75 secs without UE support   Stand to Sit 6: Modified independent (Device/Increase time)   Ambulation/Gait   Ambulation/Gait Yes   Ambulation/Gait  Assistance 6: Modified independent (Device/Increase time);5: Supervision   Ambulation/Gait Assistance Details mod I in straight path, S around turns   Ambulation Distance (Feet) 345 Feet   Assistive device None   Gait Pattern Step-through pattern;Decreased stride length;Narrow base of support  narrow BOS at times   Ambulation Surface Level;Indoor   Gait velocity 4.00 ft/sec   Stairs Yes   Stairs Assistance 6: Modified independent (Device/Increase time)   Stair Management Technique One rail Right;Step to pattern;Forwards   Number of Stairs 4   Height of Stairs 6   Functional Gait  Assessment   Gait assessed  Yes   Gait Level Surface Walks 20 ft in less than 7 sec but greater than 5.5 sec, uses assistive device, slower speed, mild gait deviations, or deviates 6-10 in outside of the 12 in walkway width.   Change in Gait Speed Able to change speed, demonstrates mild gait deviations, deviates 6-10 in outside of the 12 in walkway width, or no gait deviations, unable to achieve a major change in velocity, or uses a change in velocity, or uses an assistive device.   Gait with Horizontal Head Turns Performs head turns with moderate changes in gait velocity, slows down, deviates 10-15 in outside 12 in walkway width but recovers, can continue to walk.   Gait with Vertical Head Turns Performs task with slight change in gait velocity (eg, minor disruption to smooth gait path), deviates 6 - 10 in outside 12 in walkway width or uses assistive device   Gait and Pivot Turn Pivot turns safely in greater than 3 sec and stops with no loss of balance, or pivot turns safely within 3 sec and stops with mild imbalance, requires small steps to catch balance.   Step Over Obstacle Is able to step over one shoe box (4.5 in total height) without changing gait speed. No evidence of imbalance.   Gait with Narrow Base of Support Ambulates 4-7 steps.   Gait with Eyes Closed Walks 20 ft, slow speed, abnormal gait pattern,  evidence for imbalance, deviates 10-15 in outside 12 in walkway width. Requires more than 9 sec to ambulate 20 ft.   Ambulating Backwards Walks 20 ft, uses assistive device, slower speed, mild gait deviations, deviates 6-10 in outside 12 in walkway width.   Steps Alternating feet, must use rail.   Total Score 17   FGA comment: < 19 = high risk fall                           PT Education -  03/13/16 1117    Education provided Yes   Education Details PT POC, goals, FGA results   Person(s) Educated Patient   Methods Explanation   Comprehension Verbalized understanding          PT Short Term Goals - 03/13/16 1127    PT SHORT TERM GOAL #1   Title Pt will initiate HEP in order to indicate improved functional mobility and decreased fall risk.  (Target Date: 04/03/16)   Status New           PT Long Term Goals - 03/13/16 1128    PT LONG TERM GOAL #1   Title Pt will be independent with HEP in order to indicate decreased fall risk and improved functional mobility. (Target Date: 04/24/16)   Status New   PT LONG TERM GOAL #2   Title Pt will improve FGA score >19/30 in order to indicate decreased fall risk.     Status New   PT LONG TERM GOAL #3   Title Pt will verbalize understanding of warning signs and risk factors of CVA in order to reduce time to seeking medical attention in case of future stroke.     PT LONG TERM GOAL #4   Title Pt will ambulate >500' on varying outdoor surfaces (including grass) at mod I level in order to indicate safe community negotiation.                 Plan - 03/13/16 0931    Clinical Impression Statement Pt presents s/p R CVA with residual L sided weakness, decreased balance, and decreased sensation.  Note history of DM with neuropathy, CHF and CAD and is current smoker which could all impact progress with therapy.  Upon PT evaluation, note that FGA is 17/30 indicative of high fall risk, but gait speed is 4.00 ft/sec.  Note however  that the faster pt tends to ambulate, the more likely he is to loose his balance, therefore feel he would be safer with somewhat slower gait speed.  Also note that he stepped on thumbtac from roof shingle approx 2-3 weeks prior to CVA and still has healing wound.  Pt states that it is somewhat painful and note areas of darkness and calus around area.  Pt to see PCP tomorrow, therefore encouraged pt to discuss this with MD.  Pt is of evolving presentation and moderate complexity per PT POC.  Pt will benefit from skilled OP neuro PT in order to address deficits.     Rehab Potential Good   Clinical Impairments Affecting Rehab Potential impulsivity, co-payment   PT Frequency 1x / week   PT Duration 6 weeks   PT Treatment/Interventions ADLs/Self Care Home Management;Gait training;DME Instruction;Stair training;Functional mobility training;Therapeutic activities;Therapeutic exercise;Balance training;Neuromuscular re-education;Patient/family education;Energy conservation;Vestibular   PT Next Visit Plan est HEP for balance, high level balance with gait (head turns, ball toss), slower gait speed for safety   Consulted and Agree with Plan of Care Patient      Patient will benefit from skilled therapeutic intervention in order to improve the following deficits and impairments:  Abnormal gait, Decreased balance, Decreased cognition, Decreased coordination, Decreased knowledge of precautions, Decreased mobility, Decreased safety awareness, Decreased skin integrity, Decreased strength, Difficulty walking, Impaired sensation, Dizziness, Impaired perceived functional ability, Postural dysfunction  Visit Diagnosis: Unsteadiness on feet - Plan: PT plan of care cert/re-cert  Other abnormalities of gait and mobility - Plan: PT plan of care cert/re-cert  Muscle weakness (generalized) - Plan: PT plan of care cert/re-cert  G-Codes - 03/13/16 1136    Functional Assessment Tool Used FGA: 17/30   Functional  Limitation Mobility: Walking and moving around   Mobility: Walking and Moving Around Current Status [phone]) At least 40 percent but less than 60 percent impaired, limited or restricted   Mobility: Walking and Moving Around Goal Status [phone]) At least 20 percent but less than 40 percent impaired, limited or restricted       Problem List Patient Active Problem List   Diagnosis Date Noted  . Tobacco use disorder 02/28/2016  . Acute CVA (cerebrovascular accident) ([place]) 02/27/2016  . DM2 (diabetes mellitus, type 2) ([place]) 02/27/2016  . Anemia 02/27/2016  . Chronic systolic CHF (congestive heart failure) ([place]) 05/05/2015  . 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 Essential hypertension 07/16/2014  . Former smoker 04/21/2014  . Thrombotic stroke ([place]) 01/23/2014  . Dizziness 01/23/2014  . Diabetes ([place]) 01/23/2014  . Facial droop 01/23/2014  . Benign neoplasm of colon 08/25/2011  . CAD, NATIVE VESSEL 05/23/2010  . DIABETIC  RETINOPATHY 03/21/2010  . Hyperlipidemia 03/21/2010  . NEUROPATHY 03/21/2010  . HYPERTENSION 03/21/2010  . LBBB 03/21/2010  . 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 CERVICAL RADICULOPATHY 03/21/2010    [name], PT, MPT [facility] [address] [place], [place], [address] Phone: [phone]   Fax:  [phone] 03/13/2016, 11:38 AM  Name: [name] MRN: [id number] Date of Birth: May 22, 1941

## 2016-03-14 NOTE — Therapy (Signed)
[facility] [address] [place], [place], [address] Phone: [phone]   Fax:  [phone]  Occupational Therapy Evaluation  Patient Details  Name: [name] MRN: [id number] Date of Birth: May 04, 1941 Referring Provider: Dr. Barton Dubois  Encounter Date: 03/13/2016      OT End of Session - 03/14/16 1501    Visit Number 1   Number of Visits 9   Date for OT Re-Evaluation 05/13/16   Authorization Type [employer] Medicare, no auth/no visit limit   Authorization - Visit Number 1   Authorization - Number of Visits 10   OT Start Time 0805   OT Stop Time 0845   OT Time Calculation (min) 40 min   Activity Tolerance Patient tolerated treatment well   Behavior During Therapy [facility] for tasks assessed/performed      Past Medical History  Diagnosis Date  . Diabetes mellitus   . Hyperlipidemia   . Allergy   . Cataract   . Colon polyps 2012    Colonoscopy  . Diverticulosis 2012    Colonoscopy     Past Surgical History  Procedure Laterality Date  . Cataract extraction      both eyes  . Neck surgery    . Colon surgery      to repair intestines as child  . Knee arthroscopy      left  . Colonoscopy  10/20/2011    Procedure: COLONOSCOPY;  Surgeon: Inda Castle, MD;  Location: [facility];  Service: Endoscopy;  Laterality: N/A;  . Hot hemostasis  10/20/2011    Procedure: HOT HEMOSTASIS (ARGON PLASMA COAGULATION/BICAP);  Surgeon: Inda Castle, MD;  Location: [facility];  Service: Endoscopy;  Laterality: N/A;    There were no vitals filed for this visit.      Subjective Assessment - 03/13/16 0811    Subjective  Pt reports that has not yet returned to yardwork   Pertinent History hx of back pain   Patient Stated Goals improve strength   Currently in Pain? No/denies  hx of chronic low back/R knee pain is stand too long (0-5/10), none currently--OT will not directly address due to nature but will monitor as relates  to treatment           [facility] OT Assessment - 03/14/16 0001    Assessment   Diagnosis CVA   Referring Provider Dr. Barton Dubois   Onset Date 02/27/16  hospitalized 6/4/-02/29/16   Precautions   Precautions Fall   Balance Screen   Has the patient fallen in the past 6 months Yes   How many times? 2x/months prior to CVA and 2x since CVA  turning, getting up from chair too fast   [place] expects to be discharged to: Private residence   Lives With [place] Retired  was a Passenger transport manager   Leisure travel (inside and outside of country)   ADL   ADL comments Pt performing BADLs mod I.   IADL   Prior Level of Function Light Housekeeping independent:  yard work, Comptroller light daily tasks such as dishwashing, bed making  has not attempted yard work yet   Prior Level of Function Meal Prep wife has always performed   Meal Prep Able to complete simple cold meal and snack prep  warms and serves   Prior Level of Function Community Mobility pt reports  that MD told him to wait to drive, but pt reports driving short distances without problems; cautioned pt not to drive without MD clearance   Community Mobility Drives own vehicle   Medication Management Is responsible for taking medication in correct dosages at correct time   Prior Level of Function Financial Management wife has always done this   Mobility   Mobility Status History of falls   Mobility Status Comments Pt reports that he stepped on roofing tack approx 43month prior CVA and caused him to limp   Written Expression   Handwriting 90% legible;Increased time  copying text   Vision - History   Baseline Vision Bifocals   Visual History --  hx of cataract surgery   Additional Comments Pt denies change in vision   Vision Assessment   Vision Assessment Vision not tested  to be assessed in functional  context prn   Cognition   Overall Cognitive Status Impaired/Different from baseline  no change per pt report   Mini Mental State Exam  due to time constraints, unable to complete language and orientation sections of MOCA today, naming with 3/3 correct   Area of Impairment Attention;Memory;Awareness   Attention Comments Attention section on MOCA 5/6 with decr ability to perform serial subtraction'   Memory Decreased short-term memory   Memory Comments 2/5 correct for delayed recall on MOCA   Awareness Intellectual;Emergent   Awareness Comments decr for cognitive changes initially   Executive Function Organizing;Sequencing;Self Correcting  visuospatial/executive function section of MOCA (2/5)    Sequencing --  max cues for trailmaking of MOCA   Organizing Impaired   Organizing Impairment --  skipped number, cues for # on clock drawing   Self Correcting Impaired   Sensation   Light Touch Impaired by gross assessment   Additional Comments Pt reports numbness in R fingertips   Coordination   9 Hole Peg Test Right;Left   Right 9 Hole Peg Test 36.69   Left 9 Hole Peg Test 29.37   AROM   Overall AROM  Within functional limits for tasks performed   Strength   Overall Strength Deficits   Overall Strength Comments LUE grossly 5/5; RUE proximal strength grossly 4- to 4/5   Hand Function   Right Hand Grip (lbs) 52   Left Hand Grip (lbs) 71                         OT Education - 03/13/16 1719    Education provided Yes   Education Details OT POC; Recommended against driving if physician has not cleared pt   Person(s) Educated Patient   Methods Explanation   Comprehension Verbalized understanding          OT Short Term Goals - 03/14/16 1513    OT SHORT TERM GOAL #1   Title Pt will be independent with initial HEP for RUE.--check STGs 04/12/16   Baseline 4   Period Weeks   Status New   OT SHORT TERM GOAL #2   Title Pt will improve R grip strength by at least 8lbs  in prep for yard work   Baseline R-52lbs, L-71lbs   Time 4   Period Weeks   Status New   OT SHORT TERM GOAL #3   Title Pt will improve coordination for ADLs as shown by improving time on 9-hole peg test by at least 6sec.   Baseline 36.69sec   Time 4   Period Weeks   Status New  OT SHORT TERM GOAL #4   Title Pt will verbalize understanding of memory compensation strategies for ADLs.   Time 4   Period Weeks   Status New           OT Long Term Goals - 03/14/16 1516    OT LONG TERM GOAL #1   Title Pt will be independent with updated HEP--check LTGs 05/12/16   Time 8   Period Weeks   Status New   OT LONG TERM GOAL #2   Title Pt will improve R grip strength by at least 15lbs in prep for yard work   Baseline R-52lbs   Time 8   Period Weeks   Status New   OT LONG TERM GOAL #3   Title Pt will report resuming yardwork mod I.   Time 8   Period Weeks   Status New   OT LONG TERM GOAL #4   Title Pt will verbalize understanding of cognitive HEP prn.   Time 8   Period Weeks   Status New   OT LONG TERM GOAL #5   Title Pt will verbalize understanding of CVA warning signs and risk factors.   Time 8   Period Weeks   Status New               Plan - 03/14/16 1504    Clinical Impression Statement Pt is a 75 y.o. male s/p CVA 02/27/16.  Pt was hospitalized 02/27/16-02/29/16.  Pt with PMH that includes hx of chronic back pain, DM, CHF, HTN, and diabetic retinopathy per medical records.  Pt also reports that he stepped on nail with R foot prior to CVA and that it is not healed yet.  Pt presents with decr RUE strength, decr coordination, and cognitive deficits.  Pt would benefit from occupational therapy to address these deficits to improve ADL/IADL performance/safety.     Rehab Potential Good   OT Frequency 1x / week   OT Duration 8 weeks  +eval   OT Treatment/Interventions Self-care/ADL training;Passive range of motion;Moist Heat;Contrast Bath;Visual/perceptual  remediation/compensation;Cognitive remediation/compensation;Fluidtherapy;Electrical Stimulation;Cryotherapy;Parrafin;DME and/or AE instruction;Therapeutic activities;Therapeutic exercises;Ultrasound;Neuromuscular education;Manual Therapy;Splinting;Patient/family education;Therapist, nutritional;Therapeutic exercise   Plan initiate HEP (theraband, coordination, putty)   Recommended Other Services receiving PT   Consulted and Agree with Plan of Care Patient      Patient will benefit from skilled therapeutic intervention in order to improve the following deficits and impairments:  Decreased cognition, Decreased coordination, Decreased range of motion, Decreased activity tolerance, Impaired UE functional use, Decreased balance, Decreased mobility, Decreased strength  Visit Diagnosis: Hemiplegia and hemiparesis following cerebral infarction affecting right dominant side (HCC)  Other lack of coordination  Other symptoms and signs involving cognitive functions following cerebral infarction      G-Codes - 03-31-2016 1520    Functional Assessment Tool Used R 9-hole peg test 36.69sec; R grip strength 52lbs   Functional Limitation Carrying, moving and handling objects   Carrying, Moving and Handling Objects Current Status [id number]) At least 20 percent but less than 40 percent impaired, limited or restricted   Carrying, Moving and Handling Objects Goal Status [id number]) At least 1 percent but less than 20 percent impaired, limited or restricted      Problem List Patient Active Problem List   Diagnosis Date Noted  . Tobacco use disorder 02/28/2016  . Acute CVA (cerebrovascular accident) ([place]) 02/27/2016  . DM2 (diabetes mellitus, type 2) ([place]) 02/27/2016  . Anemia 02/27/2016  . Chronic systolic CHF (congestive heart failure) ([place]) 05/05/2015  .  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 Essential hypertension 07/16/2014  . Former smoker 04/21/2014  .  Thrombotic stroke ([place]) 01/23/2014  . Dizziness 01/23/2014  . Diabetes ([place]) 01/23/2014  . Facial droop 01/23/2014  . Benign neoplasm of colon 08/25/2011  . CAD, NATIVE VESSEL 05/23/2010  . DIABETIC  RETINOPATHY 03/21/2010  . Hyperlipidemia 03/21/2010  . NEUROPATHY 03/21/2010  . HYPERTENSION 03/21/2010  . LBBB 03/21/2010  . 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 CERVICAL RADICULOPATHY 03/21/2010    [facility] 03/14/2016, 3:25 PM  [facility] [address] [place], [place], [address] Phone: [phone]   Fax:  [phone]  Name: [name] MRN: [id number] Date of Birth: 1940-11-10  Vianne Bulls, OTR/L [facility] [address] [place], [place]  [address] [phone] phone [phone] 03/14/2016 3:25 PM

## 2016-03-20 ENCOUNTER — Telehealth: Payer: Self-pay | Admitting: *Deleted

## 2016-03-20 ENCOUNTER — Ambulatory Visit (INDEPENDENT_AMBULATORY_CARE_PROVIDER_SITE_OTHER): Payer: Medicare Other | Admitting: Podiatry

## 2016-03-20 ENCOUNTER — Ambulatory Visit (INDEPENDENT_AMBULATORY_CARE_PROVIDER_SITE_OTHER): Payer: Medicare Other

## 2016-03-20 ENCOUNTER — Encounter: Payer: Self-pay | Admitting: Podiatry

## 2016-03-20 VITALS — BP 110/65 | HR 78 | Temp 98.3°F | Resp 18

## 2016-03-20 DIAGNOSIS — R52 Pain, unspecified: Secondary | ICD-10-CM

## 2016-03-20 DIAGNOSIS — M86171 Other acute osteomyelitis, right ankle and foot: Secondary | ICD-10-CM | POA: Diagnosis not present

## 2016-03-20 DIAGNOSIS — T148 Other injury of unspecified body region: Secondary | ICD-10-CM | POA: Diagnosis not present

## 2016-03-20 DIAGNOSIS — T148XXA Other injury of unspecified body region, initial encounter: Secondary | ICD-10-CM

## 2016-03-20 DIAGNOSIS — Z01812 Encounter for preprocedural laboratory examination: Secondary | ICD-10-CM

## 2016-03-20 LAB — CBC WITH DIFFERENTIAL/PLATELET
Basophils Absolute: 0 cells/uL (ref 0–200)
Basophils Relative: 0 %
Eosinophils Absolute: 94 cells/uL (ref 15–500)
Eosinophils Relative: 1 %
HCT: 35.2 % — ABNORMAL LOW (ref 38.5–50.0)
Hemoglobin: 11.2 g/dL — ABNORMAL LOW (ref 13.2–17.1)
Lymphocytes Relative: 18 %
Lymphs Abs: 1692 cells/uL (ref 850–3900)
MCH: 29.6 pg (ref 27.0–33.0)
MCHC: 31.8 g/dL — ABNORMAL LOW (ref 32.0–36.0)
MCV: 93.1 fL (ref 80.0–100.0)
MPV: 9.7 fL (ref 7.5–12.5)
Monocytes Absolute: 846 cells/uL (ref 200–950)
Monocytes Relative: 9 %
Neutro Abs: 6768 cells/uL (ref 1500–7800)
Neutrophils Relative %: 72 %
Platelets: 241 10*3/uL (ref 140–400)
RBC: 3.78 MIL/uL — ABNORMAL LOW (ref 4.20–5.80)
RDW: 14.5 % (ref 11.0–15.0)
WBC: 9.4 10*3/uL (ref 3.8–10.8)

## 2016-03-20 LAB — BASIC METABOLIC PANEL
BUN: 25 mg/dL (ref 7–25)
CO2: 22 mmol/L (ref 20–31)
Calcium: 8.8 mg/dL (ref 8.6–10.3)
Chloride: 109 mmol/L (ref 98–110)
Creat: 1.57 mg/dL — ABNORMAL HIGH (ref 0.70–1.18)
Glucose, Bld: 138 mg/dL — ABNORMAL HIGH (ref 65–99)
Potassium: 5.7 mmol/L — ABNORMAL HIGH (ref 3.5–5.3)
Sodium: 139 mmol/L (ref 135–146)

## 2016-03-20 LAB — C-REACTIVE PROTEIN: CRP: 4.4 mg/dL — ABNORMAL HIGH (ref <0.60)

## 2016-03-20 LAB — SEDIMENTATION RATE: Sed Rate: 27 mm/hr — ABNORMAL HIGH (ref 0–20)

## 2016-03-20 MED ORDER — CIPROFLOXACIN HCL 500 MG PO TABS: 500.0000 mg | ORAL_TABLET | Freq: Two times a day (BID) | ORAL | Status: DC

## 2016-03-20 MED ORDER — CEPHALEXIN 500 MG PO CAPS: 500.0000 mg | ORAL_CAPSULE | Freq: Three times a day (TID) | ORAL | Status: DC

## 2016-03-20 NOTE — Progress Notes (Signed)
   Subjective:    Patient ID: [name], male    DOB: 1941-07-07, 75 y.o.   MRN: [id number]  HPI  75 year old male presents the also family members for concerns of the wound to the bottom his right foot which is been ongoing for about 8 weeks. At that time he stepped on a roofing tack in April and he states that approximate 4 inch nail went through a shingle through his shoe and into his foot. He did remove the nail in total including the tip. He has recently had a tetanus shot and previously was on clindamycin after the injury however since and the wound has not healed and is been progressing. He has been soaking his foot in Epson salt that was burning so he discontinued. He has noticed increased swelling and redness to his right foot. He denies any systemic complaints such as fevers, chills, nausea, vomiting. No calf pain, chest pain, shortness of breath.  Review of Systems  All other systems reviewed and are negative.      Objective:   Physical Exam General: AAO x3, NAD  Dermatological: On the plantar aspect of the right foot submetatarsal one is a hyperkeratotic lesion with central ulceration. The wound today measures 0.5 x 0.5 x 1.5 cm. There is no probing to bone however does probe to the first interspace. There is no undermining or tunneling. There is no fluctuance or crepitus. There is no malodor. There is no drainage or pus expressed. There is localized edema and erythema on the first MTPJ. There is edema to the lesser digits as well. There is no ascending cellulitis. No other open lesions are present.  Vascular: Dorsalis Pedis artery and Posterior Tibial artery pedal pulses aredecreased on the right.  No varicosities and no lower extremity edema present bilateral. There is no pain with calf compression, swelling, warmth, erythema.   Neruologic:Sensation decreased with Derrel Nip monofilament, decreased vibratory sensation.   Musculoskeletal: No gross boney pedal deformities  bilateral. No pain, crepitus, or limitation noted with foot and ankle range of motion bilateral. Muscular strength 5/5 in all groups tested bilateral.  Gait: Unassisted, Nonantalgic.      Assessment & Plan:  75 year old male right foot puncture wound with cellulitis, possible abscess -Treatment options discussed including all alternatives, risks, and complications -Etiology of symptoms were discussed -X-rays were obtained and reviewed with the patient. No evidence of foreign body. There is radiolucency within the medial aspect of the first metatarsal head. This could be a sign of infection versus cysts which are chronic. No old x-rays to compare. -Given the nonhealing of the wound is decreased pulses will order arterial studies. -Order MRI to rule out abscess, possible osteomyelitis -Will start Keflex and ciprofloxacin -Wound culture obtained -I discussed with him that if there is any signs or symptoms of worsening infection and he is to go immediately to the emergency room and will likely need IV antibiotics. -Follow-up in 1 week or sooner if needed.  Celesta Gentile, DPM

## 2016-03-20 NOTE — Telephone Encounter (Addendum)
Dr. Jacqualyn Posey cancelled MRI right foot with/without contrast due to pt's kidney function.  Orders changed to MRI right foot without contrast. 03/20/2016-[employer] MEDICARE COMPLETE DOES NOT REQUIRE PRIOR AUTHORIZATION AT [place] CASE# [id number]. FAXED TO [place]. 03/21/2016-Pt called for his antibiotic.  Pt states the antibiotic was called to a [facility].  I told pt I could change to a local pharmacy so he could begin the medication immediately. Pt request the medication be called to the [facility].  Done. Pt called again states his other antibiotic is not at [facility].  I reviewed pt's medication list and there was Cipro to be called to the [facility].  Done and informed pt. 03/22/2016-03/20/2016 blood work faxed to Dr. Dagmar Hait.

## 2016-03-21 ENCOUNTER — Encounter: Payer: Self-pay | Admitting: Podiatry

## 2016-03-21 MED ORDER — CEPHALEXIN 500 MG PO CAPS: 500.0000 mg | ORAL_CAPSULE | Freq: Three times a day (TID) | ORAL | Status: DC

## 2016-03-21 MED ORDER — CIPROFLOXACIN HCL 500 MG PO TABS: 500.0000 mg | ORAL_TABLET | Freq: Two times a day (BID) | ORAL | Status: DC

## 2016-03-22 ENCOUNTER — Telehealth: Payer: Self-pay | Admitting: Podiatry

## 2016-03-22 ENCOUNTER — Observation Stay (HOSPITAL_COMMUNITY)
Admission: EM | Admit: 2016-03-22 | Discharge: 2016-03-27 | Disposition: A | Discharge status: Home / Self Care | Payer: Medicare Other | Attending: Internal Medicine | Admitting: Internal Medicine

## 2016-03-22 ENCOUNTER — Encounter (HOSPITAL_COMMUNITY): Payer: Self-pay | Admitting: *Deleted

## 2016-03-22 DIAGNOSIS — Z7902 Long term (current) use of antithrombotics/antiplatelets: Secondary | ICD-10-CM | POA: Diagnosis not present

## 2016-03-22 DIAGNOSIS — L97519 Non-pressure chronic ulcer of other part of right foot with unspecified severity: Secondary | ICD-10-CM | POA: Diagnosis not present

## 2016-03-22 DIAGNOSIS — I5022 Chronic systolic (congestive) heart failure: Secondary | ICD-10-CM | POA: Diagnosis not present

## 2016-03-22 DIAGNOSIS — N183 Chronic kidney disease, stage 3 (moderate): Secondary | ICD-10-CM | POA: Insufficient documentation

## 2016-03-22 DIAGNOSIS — E785 Hyperlipidemia, unspecified: Secondary | ICD-10-CM | POA: Diagnosis not present

## 2016-03-22 DIAGNOSIS — E1122 Type 2 diabetes mellitus with diabetic chronic kidney disease: Secondary | ICD-10-CM | POA: Diagnosis not present

## 2016-03-22 DIAGNOSIS — E1149 Type 2 diabetes mellitus with other diabetic neurological complication: Secondary | ICD-10-CM

## 2016-03-22 DIAGNOSIS — W450XXA Nail entering through skin, initial encounter: Secondary | ICD-10-CM | POA: Diagnosis not present

## 2016-03-22 DIAGNOSIS — R52 Pain, unspecified: Secondary | ICD-10-CM

## 2016-03-22 DIAGNOSIS — R262 Difficulty in walking, not elsewhere classified: Secondary | ICD-10-CM | POA: Diagnosis not present

## 2016-03-22 DIAGNOSIS — L03115 Cellulitis of right lower limb: Secondary | ICD-10-CM | POA: Diagnosis present

## 2016-03-22 DIAGNOSIS — I130 Hypertensive heart and chronic kidney disease with heart failure and stage 1 through stage 4 chronic kidney disease, or unspecified chronic kidney disease: Secondary | ICD-10-CM | POA: Insufficient documentation

## 2016-03-22 DIAGNOSIS — Z6823 Body mass index (BMI) 23.0-23.9, adult: Secondary | ICD-10-CM | POA: Insufficient documentation

## 2016-03-22 DIAGNOSIS — Z833 Family history of diabetes mellitus: Secondary | ICD-10-CM | POA: Insufficient documentation

## 2016-03-22 DIAGNOSIS — Z87891 Personal history of nicotine dependence: Secondary | ICD-10-CM | POA: Insufficient documentation

## 2016-03-22 DIAGNOSIS — E440 Moderate protein-calorie malnutrition: Secondary | ICD-10-CM | POA: Insufficient documentation

## 2016-03-22 DIAGNOSIS — L02619 Cutaneous abscess of unspecified foot: Secondary | ICD-10-CM | POA: Diagnosis present

## 2016-03-22 DIAGNOSIS — L089 Local infection of the skin and subcutaneous tissue, unspecified: Secondary | ICD-10-CM

## 2016-03-22 DIAGNOSIS — Z79899 Other long term (current) drug therapy: Secondary | ICD-10-CM | POA: Insufficient documentation

## 2016-03-22 DIAGNOSIS — N179 Acute kidney failure, unspecified: Secondary | ICD-10-CM | POA: Diagnosis not present

## 2016-03-22 DIAGNOSIS — Z8673 Personal history of transient ischemic attack (TIA), and cerebral infarction without residual deficits: Secondary | ICD-10-CM | POA: Insufficient documentation

## 2016-03-22 DIAGNOSIS — L0291 Cutaneous abscess, unspecified: Secondary | ICD-10-CM

## 2016-03-22 DIAGNOSIS — Z7982 Long term (current) use of aspirin: Secondary | ICD-10-CM | POA: Diagnosis not present

## 2016-03-22 DIAGNOSIS — I10 Essential (primary) hypertension: Secondary | ICD-10-CM | POA: Diagnosis present

## 2016-03-22 DIAGNOSIS — E1129 Type 2 diabetes mellitus with other diabetic kidney complication: Secondary | ICD-10-CM

## 2016-03-22 DIAGNOSIS — E119 Type 2 diabetes mellitus without complications: Secondary | ICD-10-CM

## 2016-03-22 DIAGNOSIS — S91331A Puncture wound without foreign body, right foot, initial encounter: Secondary | ICD-10-CM | POA: Diagnosis present

## 2016-03-22 DIAGNOSIS — E11628 Type 2 diabetes mellitus with other skin complications: Secondary | ICD-10-CM | POA: Diagnosis present

## 2016-03-22 DIAGNOSIS — E11621 Type 2 diabetes mellitus with foot ulcer: Secondary | ICD-10-CM | POA: Diagnosis not present

## 2016-03-22 DIAGNOSIS — L03119 Cellulitis of unspecified part of limb: Secondary | ICD-10-CM | POA: Diagnosis present

## 2016-03-22 HISTORY — DX: Cerebral infarction, unspecified: I63.9

## 2016-03-22 LAB — CBC WITH DIFFERENTIAL/PLATELET
Basophils Absolute: 0 K/uL (ref 0.0–0.1)
Basophils Relative: 0 %
Eosinophils Absolute: 0 K/uL (ref 0.0–0.7)
Eosinophils Relative: 0 %
HCT: 32.6 % — ABNORMAL LOW (ref 39.0–52.0)
Hemoglobin: 10.7 g/dL — ABNORMAL LOW (ref 13.0–17.0)
Lymphocytes Relative: 10 %
Lymphs Abs: 1.4 K/uL (ref 0.7–4.0)
MCH: 30 pg (ref 26.0–34.0)
MCHC: 32.8 g/dL (ref 30.0–36.0)
MCV: 91.3 fL (ref 78.0–100.0)
Monocytes Absolute: 1.2 K/uL — ABNORMAL HIGH (ref 0.1–1.0)
Monocytes Relative: 9 %
Neutro Abs: 10.6 K/uL — ABNORMAL HIGH (ref 1.7–7.7)
Neutrophils Relative %: 81 %
Platelets: 247 K/uL (ref 150–400)
RBC: 3.57 MIL/uL — ABNORMAL LOW (ref 4.22–5.81)
RDW: 13.6 % (ref 11.5–15.5)
WBC: 13.2 K/uL — ABNORMAL HIGH (ref 4.0–10.5)

## 2016-03-22 LAB — COMPREHENSIVE METABOLIC PANEL
ALT: 11 U/L — ABNORMAL LOW (ref 17–63)
AST: 15 U/L (ref 15–41)
Albumin: 3.1 g/dL — ABNORMAL LOW (ref 3.5–5.0)
Alkaline Phosphatase: 62 U/L (ref 38–126)
Anion gap: 6 (ref 5–15)
BUN: 23 mg/dL — ABNORMAL HIGH (ref 6–20)
CO2: 23 mmol/L (ref 22–32)
Calcium: 9 mg/dL (ref 8.9–10.3)
Chloride: 104 mmol/L (ref 101–111)
Creatinine, Ser: 1.92 mg/dL — ABNORMAL HIGH (ref 0.61–1.24)
GFR calc Af Amer: 39 mL/min — ABNORMAL LOW (ref >60)
GFR calc non Af Amer: 33 mL/min — ABNORMAL LOW (ref >60)
Glucose, Bld: 261 mg/dL — ABNORMAL HIGH (ref 65–99)
Potassium: 4.5 mmol/L (ref 3.5–5.1)
Sodium: 133 mmol/L — ABNORMAL LOW (ref 135–145)
Total Bilirubin: 0.6 mg/dL (ref 0.3–1.2)
Total Protein: 6.3 g/dL — ABNORMAL LOW (ref 6.5–8.1)

## 2016-03-22 LAB — I-STAT CG4 LACTIC ACID, ED: Lactic Acid, Venous: 0.88 mmol/L (ref 0.5–1.9)

## 2016-03-22 MED ORDER — VANCOMYCIN HCL 10 G IV SOLR
1500.0000 mg | Freq: Once | INTRAVENOUS | Status: AC
  Administered 2016-03-22: 1500 mg via INTRAVENOUS
  Filled 2016-03-22: qty 1500

## 2016-03-22 NOTE — ED Provider Notes (Signed)
CSN: [id number]     Arrival date & time 03/22/16  2105 History  By signing my name below, I, Altamease Oiler, attest that this documentation has been prepared under the direction and in the presence of Everlene Balls, MD. Electronically Signed: Altamease Oiler, ED Scribe. 03/22/2016. 11:32 PM   Chief Complaint  Patient presents with  . Foot Pain   The history is provided by the patient. No language interpreter was used.   Lajarvis Farleigh is a 75 y.o. male with PMHx of DM who presents to the Emergency Department complaining of ongoing, shooting, 8/10 in severity, right foot pain with onset in April of this year after he stepped on a nail. He was seen immediately after the injury and his tetanus was updated. Since then he has had ongoing pain and drainage from the wound in his foot. He has been seen by his PCP for the injury and finished 2 rounds of abx. Most recently he was seen by a podiatrist 2 days ago where he had some imaging but since then he notes increased pain and swelling. The pain is now radiating up the leg to the knee.  Past Medical History  Diagnosis Date  . Diabetes mellitus   . Hyperlipidemia   . Allergy   . Cataract   . Colon polyps 2012    Colonoscopy  . Diverticulosis 2012    Colonoscopy   . Stroke [facility])    Past Surgical History  Procedure Laterality Date  . Cataract extraction      both eyes  . Neck surgery    . Colon surgery      to repair intestines as child  . Knee arthroscopy      left  . Colonoscopy  10/20/2011    Procedure: COLONOSCOPY;  Surgeon: Inda Castle, MD;  Location: [facility] ENDOSCOPY;  Service: Endoscopy;  Laterality: N/A;  . Hot hemostasis  10/20/2011    Procedure: HOT HEMOSTASIS (ARGON PLASMA COAGULATION/BICAP);  Surgeon: Inda Castle, MD;  Location: [facility];  Service: Endoscopy;  Laterality: N/A;   Family History  Problem Relation Age of Onset  . Colon cancer Neg Hx   . Esophageal cancer Neg Hx   . Stomach cancer Neg Hx   . Anesthesia  problems Neg Hx   . Hypotension Neg Hx   . Malignant hyperthermia Neg Hx   . Pseudochol deficiency Neg Hx   . Diabetes Mellitus II Mother    Social History  Substance Use Topics  . Smoking status: Former Smoker    Types: Cigarettes    Quit date: 02/23/2014  . Smokeless tobacco: Never Used     Comment: tobacco info given 11/07/2011  . Alcohol Use: Yes     Comment: rarely    Review of Systems  10 Systems reviewed and all are negative for acute change except as noted in the HPI.  Allergies  Review of patient's allergies indicates no known allergies.  Home Medications   Prior to Admission medications   Medication Sig Start Date End Date Taking? Authorizing Provider  aspirin EC 325 MG EC tablet Take 1 tablet (325 mg total) by mouth daily. Patient not taking: Reported on 03/13/2016 02/29/16   Barton Dubois, MD  carvedilol (COREG) 12.5 MG tablet Take one tablet by mouth daily 11/03/15   [name], MD  cephALEXin (KEFLEX) 500 MG capsule Take 1 capsule (500 mg total) by mouth 3 (three) times daily. 03/21/16   Trula Slade, DPM  ciprofloxacin (CIPRO) 500 MG tablet  Take 1 tablet (500 mg total) by mouth 2 (two) times daily. 03/21/16   Trula Slade, DPM  clopidogrel (PLAVIX) 75 MG tablet Take 1 tablet (75 mg total) by mouth daily with breakfast. 04/05/15   Rosalin Hawking, MD  feeding supplement, GLUCERNA SHAKE, (GLUCERNA SHAKE) LIQD Take 237 mLs by mouth daily. 02/29/16   Barton Dubois, MD  furosemide (LASIX) 40 MG tablet Take 1 tablet (40 mg total) by mouth daily. 12/16/14   [name], MD  glimepiride (AMARYL) 1 MG tablet Take 1 mg by mouth 2 (two) times daily.    Historical Provider, MD  Iron Combinations (NIFEREX) TABS Take 150 mg by mouth daily. 02/29/16   Barton Dubois, MD  losartan (COZAAR) 100 MG tablet Take 1 tablet (100 mg total) by mouth daily. 01/26/14   Sheila Oats, MD  metFORMIN (GLUCOPHAGE) 1000 MG tablet Take 1,000 mg by mouth 2 (two) times daily.  10/11/15   Historical  Provider, MD  montelukast (SINGULAIR) 10 MG tablet Take 10 mg by mouth every morning.     Historical Provider, MD  potassium chloride SA (K-DUR,KLOR-CON) 20 MEQ tablet Take 0.5 tablets (10 mEq total) by mouth daily. Patient not taking: Reported on 03/13/2016 01/06/15   [name], MD  simvastatin (ZOCOR) 40 MG tablet Take 40 mg by mouth daily.     Historical Provider, MD  spironolactone (ALDACTONE) 25 MG tablet Take 0.5 tablets (12.5 mg total) by mouth daily. 02/16/16   [name], MD   BP 128/56 mmHg  Pulse 96  Temp(Src) 99.8 F (37.7 C) (Oral)  Resp 18  Ht 5\' 10"  (1.778 m)  Wt 161 lb (73.029 kg)  BMI 23.10 kg/m2  SpO2 100% Physical Exam  Constitutional: He is oriented to person, place, and time. Vital signs are normal. He appears well-developed and well-nourished.  Non-toxic appearance. He does not appear ill. No distress.  HENT:  Head: Normocephalic and atraumatic.  Nose: Nose normal.  Mouth/Throat: Oropharynx is clear and moist. No oropharyngeal exudate.  Eyes: Conjunctivae and EOM are normal. Pupils are equal, round, and reactive to light. No scleral icterus.  Neck: Normal range of motion. Neck supple. No tracheal deviation, no edema, no erythema and normal range of motion present. No thyroid mass and no thyromegaly present.  Cardiovascular: Normal rate, regular rhythm, S1 normal, S2 normal, normal heart sounds, intact distal pulses and normal pulses.  Exam reveals no gallop and no friction rub.   No murmur heard. Pulmonary/Chest: Effort normal and breath sounds normal. No respiratory distress. He has no wheezes. He has no rhonchi. He has no rales.  Abdominal: Soft. Normal appearance and bowel sounds are normal. He exhibits no distension, no ascites and no mass. There is no hepatosplenomegaly. There is no tenderness. There is no rebound, no guarding and no CVA tenderness.  Musculoskeletal: Normal range of motion.  RLE: Right foot with diffuse erythema Swelling to the foot  and ankle Warmth to the foot and ankle TTP from the foot to mid tibia Normal pulses and sensation  Lymphadenopathy:    He has no cervical adenopathy.  Neurological: He is alert and oriented to person, place, and time. He has normal strength. No cranial nerve deficit or sensory deficit.  Skin: Skin is warm, dry and intact. No petechiae and no rash noted. He is not diaphoretic. No erythema. No pallor.  Psychiatric: He has a normal mood and affect. His behavior is normal. Judgment normal.  Nursing note and vitals reviewed.  ED Course  Procedures (including critical care time) DIAGNOSTIC STUDIES: Oxygen Saturation is 100% on RA,  normal by my interpretation.    COORDINATION OF CARE: 11:21 PM Discussed treatment plan which includes lab work, right foot XR, and IV abx with pt at bedside and pt agreed to plan.  Labs Review Labs Reviewed  COMPREHENSIVE METABOLIC PANEL - Abnormal; Notable for the following:    Sodium 133 (*)    Glucose, Bld 261 (*)    BUN 23 (*)    Creatinine, Ser 1.92 (*)    Total Protein 6.3 (*)    Albumin 3.1 (*)    ALT 11 (*)    GFR calc non Af Amer 33 (*)    GFR calc Af Amer 39 (*)    All other components within normal limits  CBC WITH DIFFERENTIAL/PLATELET - Abnormal; Notable for the following:    WBC 13.2 (*)    RBC 3.57 (*)    Hemoglobin 10.7 (*)    HCT 32.6 (*)    Neutro Abs 10.6 (*)    Monocytes Absolute 1.2 (*)    All other components within normal limits  SEDIMENTATION RATE - Abnormal; Notable for the following:    Sed Rate 57 (*)    All other components within normal limits  C-REACTIVE PROTEIN - Abnormal; Notable for the following:    CRP 9.4 (*)    All other components within normal limits  PREALBUMIN - Abnormal; Notable for the following:    Prealbumin 12.3 (*)    All other components within normal limits  GLUCOSE, CAPILLARY - Abnormal; Notable for the following:    Glucose-Capillary 302 (*)    All other components within normal limits   GLUCOSE, CAPILLARY - Abnormal; Notable for the following:    Glucose-Capillary 137 (*)    All other components within normal limits  CULTURE, BLOOD (ROUTINE X 2)  CULTURE, BLOOD (ROUTINE X 2)  HIV ANTIBODY (ROUTINE TESTING)  I-STAT CG4 LACTIC ACID, ED  I-STAT CG4 LACTIC ACID, ED    Imaging Review [place] Extrem Low Right Ltd  03/23/2016  CLINICAL DATA:  Puncture 2 months ago. EXAM: ULTRASOUND right LOWER EXTREMITY LIMITED TECHNIQUE: Ultrasound examination of the lower extremity soft tissues was performed in the area of clinical concern. COMPARISON:  Radiographs 03/22/2016 FINDINGS: Directed sonographic evaluation of the area of concern in the plantar surface of the right foot demonstrates a complex collection measuring approximately 10 x 12 x 16 mm. This is in the superficial plantar soft tissues at the level of the first MTP. It probably represents an abscess, given the clinical history, but hematoma can also have this appearance. IMPRESSION: Complex collection at the area of concern on the plantar surface of the foot, consistent with abscess or hematoma. Electronically Signed   By: Andreas Newport M.D.   On: 03/23/2016 04:19   Dg Foot Complete Right  03/23/2016  CLINICAL DATA:  Diabetic foot wound EXAM: RIGHT FOOT COMPLETE - 3+ VIEW COMPARISON:  03/20/2016 FINDINGS: There is no evidence of fracture or dislocation. There is no evidence of arthropathy or other focal bone abnormality. Soft tissues are unremarkable. IMPRESSION: Negative. Electronically Signed   By: Andreas Newport M.D.   On: 03/23/2016 00:41   I have personally reviewed and evaluated these images and lab results as part of my medical decision-making.   EKG Interpretation None      MDM   Final diagnoses:  None    Patient presents to the ED for foot pain worsening  despite outpt abx.  He has failed outpatient therapy and will reuiqre IV abx.  Xray obtained to eval for osteo.  He was given vanc.  Will page hospitalist for  further care.   12:34 AM xray neg for osteo but does reveals an abscess, Dr. Alcario Drought accepts patient to med surg  I personally performed the services described in this documentation, which was scribed in my presence. The recorded information has been reviewed and is accurate.      Everlene Balls, MD 03/23/16 [phone]

## 2016-03-22 NOTE — ED Notes (Signed)
The pt is a diabetic and he has had pain in his rt foot since he stepped on a nail 2-3 weeks ago he was placed on an antibiotiche was out of town.  When he came back to town his doctor sent him to a foot doctor who placed him on an antibiotic x2.  For the past 2 days he has had severe pain in his rt foot up to his rt knee.

## 2016-03-22 NOTE — Telephone Encounter (Signed)
-----   Message from Trula Slade, DPM sent at 03/21/2016  4:43 PM EDT ----- Please send lab work to primary care doctor as potasium is elevated. Thanks.

## 2016-03-22 NOTE — Telephone Encounter (Signed)
Patient called the on call number at 8:26pm stating that he foot has become more swollen today and has had more pain to his foot. He has been on PO abx and the infection appears to be worsening. I recommend him go directly to the ER. At this point, will likely need IV abx. I would go ahead and get an MRI (this was ordered as an outpatient).Also, would go ahead and get arterial studies.   I will be more than happy to follow him while in the hospital if he gets admitted. If he does not get admitted, I will have him follow-up with me in the office this week.   Celesta Gentile, DPM

## 2016-03-23 ENCOUNTER — Inpatient Hospital Stay (HOSPITAL_COMMUNITY): Payer: Medicare Other

## 2016-03-23 ENCOUNTER — Ambulatory Visit: Payer: Medicare Other | Admitting: Physical Therapy

## 2016-03-23 ENCOUNTER — Ambulatory Visit: Payer: Medicare Other | Admitting: Occupational Therapy

## 2016-03-23 ENCOUNTER — Emergency Department (HOSPITAL_COMMUNITY): Payer: Medicare Other

## 2016-03-23 ENCOUNTER — Observation Stay (HOSPITAL_COMMUNITY): Payer: Medicare Other

## 2016-03-23 DIAGNOSIS — N179 Acute kidney failure, unspecified: Secondary | ICD-10-CM | POA: Diagnosis not present

## 2016-03-23 DIAGNOSIS — L089 Local infection of the skin and subcutaneous tissue, unspecified: Secondary | ICD-10-CM | POA: Diagnosis not present

## 2016-03-23 DIAGNOSIS — E440 Moderate protein-calorie malnutrition: Secondary | ICD-10-CM | POA: Insufficient documentation

## 2016-03-23 DIAGNOSIS — L03119 Cellulitis of unspecified part of limb: Secondary | ICD-10-CM | POA: Diagnosis not present

## 2016-03-23 DIAGNOSIS — E11628 Type 2 diabetes mellitus with other skin complications: Secondary | ICD-10-CM | POA: Diagnosis present

## 2016-03-23 DIAGNOSIS — E1169 Type 2 diabetes mellitus with other specified complication: Secondary | ICD-10-CM

## 2016-03-23 DIAGNOSIS — L02619 Cutaneous abscess of unspecified foot: Secondary | ICD-10-CM | POA: Diagnosis present

## 2016-03-23 DIAGNOSIS — E1149 Type 2 diabetes mellitus with other diabetic neurological complication: Secondary | ICD-10-CM | POA: Diagnosis not present

## 2016-03-23 DIAGNOSIS — I5022 Chronic systolic (congestive) heart failure: Secondary | ICD-10-CM | POA: Diagnosis not present

## 2016-03-23 DIAGNOSIS — S91331A Puncture wound without foreign body, right foot, initial encounter: Secondary | ICD-10-CM | POA: Diagnosis present

## 2016-03-23 LAB — GLUCOSE, CAPILLARY
Glucose-Capillary: 302 mg/dL — ABNORMAL HIGH (ref 65–99)
Glucose-Capillary: 137 mg/dL — ABNORMAL HIGH (ref 65–99)
Glucose-Capillary: 230 mg/dL — ABNORMAL HIGH (ref 65–99)
Glucose-Capillary: 186 mg/dL — ABNORMAL HIGH (ref 65–99)
Glucose-Capillary: 261 mg/dL — ABNORMAL HIGH (ref 65–99)

## 2016-03-23 LAB — PREALBUMIN: Prealbumin: 12.3 mg/dL — ABNORMAL LOW (ref 18–38)

## 2016-03-23 LAB — RENAL FUNCTION PANEL
Albumin: 2.5 g/dL — ABNORMAL LOW (ref 3.5–5.0)
Anion gap: 5 (ref 5–15)
BUN: 25 mg/dL — ABNORMAL HIGH (ref 6–20)
CO2: 22 mmol/L (ref 22–32)
Calcium: 8.3 mg/dL — ABNORMAL LOW (ref 8.9–10.3)
Chloride: 111 mmol/L (ref 101–111)
Creatinine, Ser: 1.9 mg/dL — ABNORMAL HIGH (ref 0.61–1.24)
GFR calc Af Amer: 39 mL/min — ABNORMAL LOW (ref >60)
GFR calc non Af Amer: 34 mL/min — ABNORMAL LOW (ref >60)
Glucose, Bld: 153 mg/dL — ABNORMAL HIGH (ref 65–99)
Phosphorus: 3.4 mg/dL (ref 2.5–4.6)
Potassium: 4.2 mmol/L (ref 3.5–5.1)
Sodium: 138 mmol/L (ref 135–145)

## 2016-03-23 LAB — SEDIMENTATION RATE: Sed Rate: 57 mm/hr — ABNORMAL HIGH (ref 0–16)

## 2016-03-23 LAB — HIV ANTIBODY (ROUTINE TESTING W REFLEX): HIV Screen 4th Generation wRfx: NONREACTIVE

## 2016-03-23 LAB — I-STAT CG4 LACTIC ACID, ED: Lactic Acid, Venous: 0.8 mmol/L (ref 0.5–1.9)

## 2016-03-23 LAB — C-REACTIVE PROTEIN: CRP: 9.4 mg/dL — ABNORMAL HIGH (ref <1.0)

## 2016-03-23 MED ORDER — PIPERACILLIN-TAZOBACTAM 3.375 G IVPB
3.3750 g | Freq: Three times a day (TID) | INTRAVENOUS | Status: DC
  Filled 2016-03-23 (×2): qty 50

## 2016-03-23 MED ORDER — POTASSIUM CHLORIDE CRYS ER 20 MEQ PO TBCR: 10.0000 meq | EXTENDED_RELEASE_TABLET | Freq: Every day | ORAL | Status: DC

## 2016-03-23 MED ORDER — PIPERACILLIN-TAZOBACTAM 3.375 G IVPB 30 MIN
3.3750 g | INTRAVENOUS | Status: AC
  Administered 2016-03-23: 3.375 g via INTRAVENOUS
  Filled 2016-03-23: qty 50

## 2016-03-23 MED ORDER — SPIRONOLACTONE 12.5 MG HALF TABLET: 12.5000 mg | ORAL_TABLET | Freq: Every day | ORAL | Status: DC

## 2016-03-23 MED ORDER — LOSARTAN POTASSIUM 50 MG PO TABS: 100.0000 mg | ORAL_TABLET | Freq: Every day | ORAL | Status: DC

## 2016-03-23 MED ORDER — SIMVASTATIN 40 MG PO TABS
40.0000 mg | ORAL_TABLET | Freq: Every day | ORAL | Status: DC
  Administered 2016-03-23 – 2016-03-27 (×5): 40 mg via ORAL
  Filled 2016-03-23 (×5): qty 1

## 2016-03-23 MED ORDER — GLUCERNA SHAKE PO LIQD
237.0000 mL | Freq: Three times a day (TID) | ORAL | Status: DC
  Administered 2016-03-23 – 2016-03-27 (×11): 237 mL via ORAL

## 2016-03-23 MED ORDER — MONTELUKAST SODIUM 10 MG PO TABS
10.0000 mg | ORAL_TABLET | Freq: Every morning | ORAL | Status: DC
  Administered 2016-03-23 – 2016-03-27 (×5): 10 mg via ORAL
  Filled 2016-03-23 (×5): qty 1

## 2016-03-23 MED ORDER — INSULIN ASPART 100 UNIT/ML ~~LOC~~ SOLN
0.0000 [IU] | Freq: Three times a day (TID) | SUBCUTANEOUS | Status: DC
  Administered 2016-03-23: 3 [IU] via SUBCUTANEOUS
  Administered 2016-03-23: 5 [IU] via SUBCUTANEOUS
  Administered 2016-03-23: 2 [IU] via SUBCUTANEOUS
  Administered 2016-03-24: 8 [IU] via SUBCUTANEOUS
  Administered 2016-03-24: 3 [IU] via SUBCUTANEOUS
  Administered 2016-03-24: 5 [IU] via SUBCUTANEOUS
  Administered 2016-03-25: 11 [IU] via SUBCUTANEOUS
  Administered 2016-03-25: 5 [IU] via SUBCUTANEOUS
  Administered 2016-03-25: 8 [IU] via SUBCUTANEOUS
  Administered 2016-03-26: 11 [IU] via SUBCUTANEOUS
  Administered 2016-03-26: 8 [IU] via SUBCUTANEOUS
  Administered 2016-03-26: 5 [IU] via SUBCUTANEOUS
  Administered 2016-03-27: 11 [IU] via SUBCUTANEOUS
  Administered 2016-03-27: 3 [IU] via SUBCUTANEOUS

## 2016-03-23 MED ORDER — INSULIN ASPART 100 UNIT/ML ~~LOC~~ SOLN
4.0000 [IU] | Freq: Once | SUBCUTANEOUS | Status: AC
  Administered 2016-03-23: 4 [IU] via SUBCUTANEOUS

## 2016-03-23 MED ORDER — HYDROCODONE-ACETAMINOPHEN 5-325 MG PO TABS
1.0000 | ORAL_TABLET | Freq: Four times a day (QID) | ORAL | Status: DC | PRN
  Administered 2016-03-23 – 2016-03-27 (×11): 1 via ORAL
  Filled 2016-03-23 (×11): qty 1

## 2016-03-23 MED ORDER — CLOPIDOGREL BISULFATE 75 MG PO TABS
75.0000 mg | ORAL_TABLET | Freq: Every day | ORAL | Status: DC
  Administered 2016-03-23 – 2016-03-27 (×5): 75 mg via ORAL
  Filled 2016-03-23 (×5): qty 1

## 2016-03-23 MED ORDER — VANCOMYCIN HCL IN DEXTROSE 750-5 MG/150ML-% IV SOLN
750.0000 mg | INTRAVENOUS | Status: DC
  Administered 2016-03-23 – 2016-03-26 (×4): 750 mg via INTRAVENOUS
  Filled 2016-03-23 (×5): qty 150

## 2016-03-23 MED ORDER — FUROSEMIDE 20 MG PO TABS: 40.0000 mg | ORAL_TABLET | Freq: Every day | ORAL | Status: DC

## 2016-03-23 MED ORDER — CARVEDILOL 12.5 MG PO TABS
6.2500 mg | ORAL_TABLET | Freq: Two times a day (BID) | ORAL | Status: DC
  Administered 2016-03-23 – 2016-03-27 (×7): 6.25 mg via ORAL
  Filled 2016-03-23 (×8): qty 1

## 2016-03-23 MED ORDER — PIPERACILLIN-TAZOBACTAM 3.375 G IVPB
3.3750 g | Freq: Three times a day (TID) | INTRAVENOUS | Status: DC
  Administered 2016-03-23 – 2016-03-27 (×13): 3.375 g via INTRAVENOUS
  Filled 2016-03-23 (×16): qty 50

## 2016-03-23 MED ORDER — ENOXAPARIN SODIUM 40 MG/0.4ML ~~LOC~~ SOLN
40.0000 mg | SUBCUTANEOUS | Status: DC
  Administered 2016-03-23 – 2016-03-27 (×5): 40 mg via SUBCUTANEOUS
  Filled 2016-03-23 (×5): qty 0.4

## 2016-03-23 MED ORDER — ASPIRIN EC 325 MG PO TBEC
325.0000 mg | DELAYED_RELEASE_TABLET | Freq: Every day | ORAL | Status: DC
  Administered 2016-03-23 – 2016-03-27 (×5): 325 mg via ORAL
  Filled 2016-03-23 (×5): qty 1

## 2016-03-23 NOTE — Progress Notes (Signed)
Pharmacy Antibiotic Note  [name] is a 75 y.o. male admitted on 03/22/2016 with R foot wound infection.  Pharmacy has been consulted for Vancomycin and Zosyn dosing. Pt on po cipro and cephalexin PTA since 6/26. SCr up to 1.92 (baseline ~1.3)  Pt received Vanc 1.5gm In ED ~2340.  Plan: Zosyn 3.375gm IV now over 30 min then 3.375gm IV q8h - subsequent doses over 4 hours Vancomycin 750mg  IV q24h Will f/u micro data, renal function, and pt's clinical condition Vanc trough prn  Height: 5\' 10"  (177.8 cm) Weight: 161 lb (73.029 kg) IBW/kg (Calculated) : 73  Temp (24hrs), Avg:99.8 F (37.7 C), Min:99.8 F (37.7 C), Max:99.8 F (37.7 C)   Recent Labs Lab 03/20/16 0948 03/20/16 1102 03/22/16 2118 03/22/16 2128 03/22/16 2133  WBC 9.4  --  13.2*  --   --   CREATININE  --  1.57*  --  1.92*  --   LATICACIDVEN  --   --   --   --  0.88    Estimated Creatinine Clearance: 35.9 mL/min (by C-G formula based on Cr of 1.92).    No Known Allergies  Antimicrobials this admission: 6/29 Vanc >>  6/29 Zosyn >>   Dose adjustments this admission: n/a  Microbiology results: 6/29 BCx x2:  6/26 R foot wound cx (o/p):   Thank you for allowing pharmacy to be a part of this patient's care.  Sherlon Handing, PharmD, BCPS Clinical pharmacist, pager [phone] 03/23/2016 12:27 AM

## 2016-03-23 NOTE — Progress Notes (Signed)
PROGRESS NOTE    [name]  [id number] DOB: March 28, 1941 DOA: 03/22/2016 PCP: Tivis Ringer, MD  Outpatient Specialists:   Brief Narrative: 75 y.o. male with medical history significant of DM, patient presents to the ED with c/o ongoing pain, drainage, from R foot wound. He stepped on a Nail which went through his shoe and into his foot in May this year. Had tetanus shot right after this, but developed infection. 2 courses of ABx as outpatient have failed. Saw podiatrist who performed X ray 2 days ago which is worrisome for possible osteo. Drainage and erythema has been worsening and spreading up his ankle since then.  Assessment & Plan:   Principal Problem:   Diabetic infection of right foot ([place]) Active Problems:   Essential hypertension   Chronic systolic CHF (congestive heart failure) (HCC)   DM2 (diabetes mellitus, type 2) (HCC)   Cellulitis and abscess of foot   Puncture wound of foot, right, complicated  Assessment/Plan Principal Problem:  Diabetic infection of right foot ([place]) Active Problems:  Essential hypertension  Chronic systolic CHF (congestive heart failure) (HCC)  DM2 (diabetes mellitus, type 2) (HCC)  Cellulitis and abscess of foot  Puncture wound of foot, right, complicated   Diabetic infection of R foot - Diabetic foot pathway Zosyn and vanc due to stepping on nail through shoe, prior wound care recently, and failure of 2 courses of outpatient ABx Will "hydrate" patient by holding his diuretics US soft tissue ordered to look for drainable abscess of foot Ortho consult.  AKI on CKD stage 3 - creatinine increased slightly: "hydrate" patient. Repeat BMP in am.  CHF - Hold ARB and diuretics Continue ASA and Plavix for now\  DM2 - actually control isnt that bad at baseline, A1C was 7.4 earlier this month. Hold home PO  hypoglycemics Moderate dose SSI ac/hs   DVT prophylaxis: Lovenox Code Status: Full Family Communication: Wife at bedside Consults called: None Admission status: Admit to inpatient  Subjective: Nil complaints. No fever or chills. No SOB.  Objective: Filed Vitals:   03/22/16 2345 03/23/16 0045 03/23/16 0120 03/23/16 0550  BP:  135/56 146/53 121/47  Pulse: 86 88 86 86  Temp:   98.9 F (37.2 C) 98.8 F (37.1 C)  TempSrc:   Oral Oral  Resp:      Height:      Weight:      SpO2: 100% 100% 100% 100%    Intake/Output Summary (Last 24 hours) at 03/23/16 0758 Last data filed at 03/23/16 [id number]  Gross per 24 hour  Intake    600 ml  Output    300 ml  Net    300 ml   Filed Weights   03/22/16 2108  Weight: 73.029 kg (161 lb)    Examination:  General exam: Appears calm and comfortable  Respiratory system: Clear to auscultation. Respiratory effort normal. Cardiovascular system: S1 & S2 heard. Gastrointestinal system: Abdomen is nondistended, soft and nontender.  Central nervous system: Alert and oriented. No focal neurological deficits. Extremities: Ulcer right plantar surface, edema RLE  Data Reviewed: I have personally reviewed following labs and imaging studies  CBC:  Recent Labs Lab 03/20/16 0948 03/22/16 2118  WBC 9.4 13.2*  NEUTROABS 6768 10.6*  HGB 11.2* 10.7*  HCT 35.2* 32.6*  MCV 93.1 91.3  PLT 241 [id number]   Basic Metabolic Panel:  Recent Labs Lab 03/20/16 1102 03/22/16 2128  NA 139 133*  K 5.7* 4.5  CL 109 104  CO2 22 23  GLUCOSE  138* 261*  BUN 25 23*  CREATININE 1.57* 1.92*  CALCIUM 8.8 9.0   GFR: Estimated Creatinine Clearance: 35.9 mL/min (by C-G formula based on Cr of 1.92). Liver Function Tests:  Recent Labs Lab 03/22/16 2128  AST 15  ALT 11*  ALKPHOS 62  BILITOT 0.6  PROT 6.3*  ALBUMIN 3.1*   No results for input(s): LIPASE, AMYLASE in the last 168 hours. No results for input(s): AMMONIA in the last 168  hours. Coagulation Profile: No results for input(s): INR, PROTIME in the last 168 hours. Cardiac Enzymes: No results for input(s): CKTOTAL, CKMB, CKMBINDEX, TROPONINI in the last 168 hours. BNP (last 3 results) No results for input(s): PROBNP in the last 8760 hours. HbA1C: No results for input(s): HGBA1C in the last 72 hours. CBG:  Recent Labs Lab 03/23/16 0128 03/23/16 0635  GLUCAP 302* 137*   Lipid Profile: No results for input(s): CHOL, HDL, LDLCALC, TRIG, CHOLHDL, LDLDIRECT in the last 72 hours. Thyroid Function Tests: No results for input(s): TSH, T4TOTAL, FREET4, T3FREE, THYROIDAB in the last 72 hours. Anemia Panel: No results for input(s): VITAMINB12, FOLATE, FERRITIN, TIBC, IRON, RETICCTPCT in the last 72 hours. Urine analysis:    Component Value Date/Time   COLORURINE YELLOW 02/27/2016 1945   APPEARANCEUR CLEAR 02/27/2016 1945   LABSPEC 1.019 02/27/2016 1945   PHURINE 5.5 02/27/2016 1945   GLUCOSEU NEGATIVE 02/27/2016 1945   HGBUR NEGATIVE 02/27/2016 1945   BILIRUBINUR NEGATIVE 02/27/2016 1945   KETONESUR NEGATIVE 02/27/2016 1945   PROTEINUR NEGATIVE 02/27/2016 1945   UROBILINOGEN 0.2 01/23/2014 1600   NITRITE NEGATIVE 02/27/2016 1945   LEUKOCYTESUR NEGATIVE 02/27/2016 1945   Sepsis Labs: @LABRCNTIP (procalcitonin:4,lacticidven:4)  )No results found for this or any previous visit (from the past 240 hour(s)).       Radiology Studies: [place] Extrem Low Right Ltd  03/23/2016  CLINICAL DATA:  Puncture 2 months ago. EXAM: ULTRASOUND right LOWER EXTREMITY LIMITED TECHNIQUE: Ultrasound examination of the lower extremity soft tissues was performed in the area of clinical concern. COMPARISON:  Radiographs 03/22/2016 FINDINGS: Directed sonographic evaluation of the area of concern in the plantar surface of the right foot demonstrates a complex collection measuring approximately 10 x 12 x 16 mm. This is in the superficial plantar soft tissues at the level of the first MTP.  It probably represents an abscess, given the clinical history, but hematoma can also have this appearance. IMPRESSION: Complex collection at the area of concern on the plantar surface of the foot, consistent with abscess or hematoma. Electronically Signed   By: Andreas Newport M.D.   On: 03/23/2016 04:19   Dg Foot Complete Right  03/23/2016  CLINICAL DATA:  Diabetic foot wound EXAM: RIGHT FOOT COMPLETE - 3+ VIEW COMPARISON:  03/20/2016 FINDINGS: There is no evidence of fracture or dislocation. There is no evidence of arthropathy or other focal bone abnormality. Soft tissues are unremarkable. IMPRESSION: Negative. Electronically Signed   By: Andreas Newport M.D.   On: 03/23/2016 00:41        Scheduled Meds: . aspirin EC  325 mg Oral Daily  . carvedilol  6.25 mg Oral BID WC  . clopidogrel  75 mg Oral Q breakfast  . enoxaparin (LOVENOX) injection  40 mg Subcutaneous Q24H  . insulin aspart  0-15 Units Subcutaneous TID WC  . montelukast  10 mg Oral q morning - 10a  . piperacillin-tazobactam (ZOSYN)  IV  3.375 g Intravenous Q8H  . simvastatin  40 mg Oral Daily  . vancomycin  750  mg Intravenous Q24H   Continuous Infusions:    LOS: 0 days    Time spent: 25 Mins    Dana Allan, MD  [facility] Pager #: [phone] 7PM-7AM contact night coverage as above

## 2016-03-23 NOTE — Progress Notes (Signed)
Results for ROBBY, BURROUGHS (MRN [id number]) as of 03/23/2016 11:21  Ref. Range 03/23/2016 01:28 03/23/2016 06:35  Glucose-Capillary Latest Ref Range: 65-99 mg/dL 302 (H) 137 (H)  CBG elevated on admission. Infection in right foot that has been going on for several weeks. Has seen podiatrist. 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  Home meds: Amaryl, Metformin Currently on Novolog MODERATE correction scale TID.  Will continue to monitor blood sugars while in the hospital. Harvel Ricks RN BSN CDE

## 2016-03-23 NOTE — Progress Notes (Addendum)
Initial Nutrition Assessment  DOCUMENTATION CODES:   Non-severe (moderate) malnutrition in context of chronic illness  INTERVENTION:   Glucerna Shake po TID, each supplement provides 220 kcal and 10 grams of protein  NUTRITION DIAGNOSIS:   Increased nutrient needs related to wound healing as evidenced by estimated needs  GOAL:   Patient will meet greater than or equal to 90% of their needs  MONITOR:   PO intake, Supplement acceptance, Labs, Weight trends, Skin, I & O's  REASON FOR ASSESSMENT:   Consult Wound healing  ASSESSMENT:   75 y.o. Male with medical history significant of DM, patient presents to the ED with c/o ongoing pain, drainage, from R foot wound. He stepped on a Nail which went through his shoe and into his foot in April. Had tetanus shot right after this, but developed infection. 2 courses of ABx as outpatient have failed. Saw podiatrist who performed X ray 2 days ago which is worrisome for possible osteo. Drainage and erythema has been worsening and spreading up his ankle since then.  Patient reports his appetite is doing better. States he wasn't eating as well PTA; small amounts. He also revels he's lost about 100 lbs in the past 2 years from a stroke followed by PNA. Drinks [employer] at home >> would like to try Glucerna Shakes >> will order. Nutrition-Focused physical exam completed. Findings are moderate fat depletion, moderate muscle depletion, and no edema.   Diet Order:  Diet Carb Modified Fluid consistency:: Thin; Room service appropriate?: Yes  Skin:  Wound (see comment) (R foot puncture wound)  Last BM:  6/27  Height:   Ht Readings from Last 1 Encounters:  03/22/16 5\' 10"  (1.778 m)    Weight:   Wt Readings from Last 1 Encounters:  03/22/16 161 lb (73.029 kg)    Ideal Body Weight:  75.4 kg  BMI:  Body mass index is 23.1 kg/(m^2).  Estimated Nutritional Needs:   Kcal:  1700-1900  Protein:  80-90 gm  Fluid:   1.7-1.9 L  EDUCATION NEEDS:   No education needs identified at this time  Arthur Holms, RD, LDN Pager #: [phone] After-Hours Pager #: [phone]

## 2016-03-23 NOTE — Consult Note (Signed)
WOC wound consult note Reason for Consult: right foot wound with [place] positive for abscess/MRI pending.  Contacted hospitalist to verify if ortho would be consulted.  Requested that I contact Dr. Jacqualyn Posey patient current podiatrist, he does not see patients inpatient.  I have notified hospitalist of same.   Wound type: trauma with DM/ 2 courses of antibiotics.  Maintain dry dressing until MRI and ortho consult can be obtained.  WOC will follow along with you for topical care if ortho does not feel surgical intervention required.   [name] [place] RN,CWOCN [phone]

## 2016-03-23 NOTE — Care Management Important Message (Signed)
Important Message  Patient Details  Name: [name] MRN: [id number] Date of Birth: [birth year]   Medicare Important Message Given:  Yes    Loann Quill 03/23/2016, 9:56 AM

## 2016-03-23 NOTE — H&P (Signed)
History and Physical    Devontea Bonno [id number] DOB: Mar 24, 1941 DOA: 03/22/2016   PCP: Tivis Ringer, MD Chief Complaint:  Chief Complaint  Patient presents with  . Foot Pain    HPI: Terance Westland is a 75 y.o. male with medical history significant of DM, patient presents to the ED with c/o ongoing pain, drainage, from R foot wound.  He stepped on a Nail which went through his shoe and into his foot in April.  Had tetanus shot right after this, but developed infection.  2 courses of ABx as outpatient have failed.  Saw podiatrist who performed X ray 2 days ago which is worrisome for possible osteo.  Drainage and erythema has been worsening and spreading up his ankle since then.   Review of Systems: As per HPI otherwise 10 point review of systems negative.    Past Medical History  Diagnosis Date  . Diabetes mellitus   . Hyperlipidemia   . Allergy   . Cataract   . Colon polyps 2012    Colonoscopy  . Diverticulosis 2012    Colonoscopy   . Stroke [facility])     Past Surgical History  Procedure Laterality Date  . Cataract extraction      both eyes  . Neck surgery    . Colon surgery      to repair intestines as child  . Knee arthroscopy      left  . Colonoscopy  10/20/2011    Procedure: COLONOSCOPY;  Surgeon: Inda Castle, MD;  Location: [facility] ENDOSCOPY;  Service: Endoscopy;  Laterality: N/A;  . Hot hemostasis  10/20/2011    Procedure: HOT HEMOSTASIS (ARGON PLASMA COAGULATION/BICAP);  Surgeon: Inda Castle, MD;  Location: [facility];  Service: Endoscopy;  Laterality: N/A;     reports that he quit smoking about 2 years ago. His smoking use included Cigarettes. He has never used smokeless tobacco. He reports that he drinks alcohol. He reports that he does not use illicit drugs.  No Known Allergies  Family History  Problem Relation Age of Onset  . Colon cancer Neg Hx   . Esophageal cancer Neg Hx   . Stomach cancer Neg Hx   . Anesthesia problems Neg Hx   .  Hypotension Neg Hx   . Malignant hyperthermia Neg Hx   . Pseudochol deficiency Neg Hx   . Diabetes Mellitus II Mother       Prior to Admission medications   Medication Sig Start Date End Date Taking? Authorizing Provider  aspirin EC 325 MG EC tablet Take 1 tablet (325 mg total) by mouth daily. 02/29/16  Yes Barton Dubois, MD  carvedilol (COREG) 12.5 MG tablet Take one tablet by mouth daily 11/03/15  Yes [name], MD  clopidogrel (PLAVIX) 75 MG tablet Take 1 tablet (75 mg total) by mouth daily with breakfast. 04/05/15  Yes Rosalin Hawking, MD  feeding supplement, GLUCERNA SHAKE, (GLUCERNA SHAKE) LIQD Take 237 mLs by mouth daily. 02/29/16  Yes Barton Dubois, MD  furosemide (LASIX) 40 MG tablet Take 1 tablet (40 mg total) by mouth daily. 12/16/14  Yes [name], MD  glimepiride (AMARYL) 1 MG tablet Take 1 mg by mouth 2 (two) times daily.   Yes Historical Provider, MD  Iron Combinations (NIFEREX) TABS Take 150 mg by mouth daily. 02/29/16  Yes Barton Dubois, MD  losartan (COZAAR) 100 MG tablet Take 1 tablet (100 mg total) by mouth daily. 01/26/14  Yes Sheila Oats, MD  metFORMIN (GLUCOPHAGE) 1000 MG  tablet Take 1,000 mg by mouth 2 (two) times daily.  10/11/15  Yes Historical Provider, MD  montelukast (SINGULAIR) 10 MG tablet Take 10 mg by mouth every morning.    Yes Historical Provider, MD  potassium chloride SA (K-DUR,KLOR-CON) 20 MEQ tablet Take 0.5 tablets (10 mEq total) by mouth daily. 01/06/15  Yes [name], MD  simvastatin (ZOCOR) 40 MG tablet Take 40 mg by mouth daily.    Yes Historical Provider, MD  spironolactone (ALDACTONE) 25 MG tablet Take 0.5 tablets (12.5 mg total) by mouth daily. 02/16/16  Yes [name], MD    Physical Exam: Danley Danker Vitals:   03/22/16 2108 03/22/16 2315 03/22/16 2330 03/22/16 2345  BP: 128/56 155/66 141/64   Pulse: 96 81 85 86  Temp: 99.8 F (37.7 C)     TempSrc: Oral     Resp: 18     Height: 5\' 10"  (1.778 m)     Weight: 73.029 kg (161 lb)       SpO2: 100% 100% 100% 100%      Constitutional: NAD, calm, comfortable Eyes: PERRL, lids and conjunctivae normal ENMT: Mucous membranes are moist. Posterior pharynx clear of any exudate or lesions.Normal dentition.  Neck: normal, supple, no masses, no thyromegaly Respiratory: clear to auscultation bilaterally, no wheezing, no crackles. Normal respiratory effort. No accessory muscle use.  Cardiovascular: Regular rate and rhythm, no murmurs / rubs / gallops. No extremity edema. 2+ pedal pulses. No carotid bruits.  Abdomen: no tenderness, no masses palpated. No hepatosplenomegaly. Bowel sounds positive.  Musculoskeletal: no clubbing / cyanosis. No joint deformity upper and lower extremities. Good ROM, no contractures. Normal muscle tone.  Skin: no rashes, lesions, ulcers. No induration Neurologic: CN 2-12 grossly intact. Sensation intact, DTR normal. Strength 5/5 in all 4.  Psychiatric: Normal judgment and insight. Alert and oriented x 3. Normal mood.    Labs on Admission: I have personally reviewed following labs and imaging studies  CBC:  Recent Labs Lab 03/20/16 0948 03/22/16 2118  WBC 9.4 13.2*  NEUTROABS 6768 10.6*  HGB 11.2* 10.7*  HCT 35.2* 32.6*  MCV 93.1 91.3  PLT 241 [id number]   Basic Metabolic Panel:  Recent Labs Lab 03/20/16 1102 03/22/16 2128  NA 139 133*  K 5.7* 4.5  CL 109 104  CO2 22 23  GLUCOSE 138* 261*  BUN 25 23*  CREATININE 1.57* 1.92*  CALCIUM 8.8 9.0   GFR: Estimated Creatinine Clearance: 35.9 mL/min (by C-G formula based on Cr of 1.92). Liver Function Tests:  Recent Labs Lab 03/22/16 2128  AST 15  ALT 11*  ALKPHOS 62  BILITOT 0.6  PROT 6.3*  ALBUMIN 3.1*   No results for input(s): LIPASE, AMYLASE in the last 168 hours. No results for input(s): AMMONIA in the last 168 hours. Coagulation Profile: No results for input(s): INR, PROTIME in the last 168 hours. Cardiac Enzymes: No results for input(s): CKTOTAL, CKMB, CKMBINDEX, TROPONINI in  the last 168 hours. BNP (last 3 results) No results for input(s): PROBNP in the last 8760 hours. HbA1C: No results for input(s): HGBA1C in the last 72 hours. CBG: No results for input(s): GLUCAP in the last 168 hours. Lipid Profile: No results for input(s): CHOL, HDL, LDLCALC, TRIG, CHOLHDL, LDLDIRECT in the last 72 hours. Thyroid Function Tests: No results for input(s): TSH, T4TOTAL, FREET4, T3FREE, THYROIDAB in the last 72 hours. Anemia Panel: No results for input(s): VITAMINB12, FOLATE, FERRITIN, TIBC, IRON, RETICCTPCT in the last 72 hours. Urine analysis:  Component Value Date/Time   COLORURINE YELLOW 02/27/2016 1945   APPEARANCEUR CLEAR 02/27/2016 1945   LABSPEC 1.019 02/27/2016 1945   PHURINE 5.5 02/27/2016 1945   GLUCOSEU NEGATIVE 02/27/2016 1945   HGBUR NEGATIVE 02/27/2016 1945   BILIRUBINUR NEGATIVE 02/27/2016 1945   KETONESUR NEGATIVE 02/27/2016 1945   PROTEINUR NEGATIVE 02/27/2016 1945   UROBILINOGEN 0.2 01/23/2014 1600   NITRITE NEGATIVE 02/27/2016 1945   LEUKOCYTESUR NEGATIVE 02/27/2016 1945   Sepsis Labs: @LABRCNTIP (procalcitonin:4,lacticidven:4) )No results found for this or any previous visit (from the past 240 hour(s)).   Radiological Exams on Admission: Dg Foot Complete Right  03/23/2016  CLINICAL DATA:  Diabetic foot wound EXAM: RIGHT FOOT COMPLETE - 3+ VIEW COMPARISON:  03/20/2016 FINDINGS: There is no evidence of fracture or dislocation. There is no evidence of arthropathy or other focal bone abnormality. Soft tissues are unremarkable. IMPRESSION: Negative. Electronically Signed   By: Andreas Newport M.D.   On: 03/23/2016 00:41    EKG: Independently reviewed.  Assessment/Plan Principal Problem:   Diabetic infection of right foot ([place]) Active Problems:   Essential hypertension   Chronic systolic CHF (congestive heart failure) (H[facility])   DM2 (diabetes mellitus, type 2) (H[facility])   Cellulitis and abscess of foot   Puncture wound of foot, right,  complicated   Diabetic infection of R foot -  Diabetic foot pathway  Zosyn and vanc due to stepping on nail through shoe, prior wound care recently, and failure of 2 courses of outpatient ABx  Will "hydrate" patient by holding his diuretics  US soft tissue ordered to look for drainable abscess of foot  CKD stage 3 - creatinine increased slightly:  "hydrate" patient by holding diuretics  Hold losartan  CHF -  Hold ARB and diuretics  Continue ASA and Plavix for now\  DM2 - actually control isnt that bad at baseline, A1C was 7.4 earlier this month.  Hold home PO hypoglycemics  Moderate dose SSI ac/hs   DVT prophylaxis: Lovenox Code Status: Full Family Communication: Wife at bedside Consults called: None Admission status: Admit to inpatient   Etta Quill DO [facility] Pager [phone] from 7PM-7AM  If 7AM-7PM, please contact the day physician for the patient [email or web address] Password [email or web address]  03/23/2016, 12:50 AM

## 2016-03-23 NOTE — Care Management Obs Status (Signed)
[place] NOTIFICATION   Patient Details  Name: Cristal Kinzer MRN: [id number] Date of Birth: 07-Jun-1941   Medicare Observation Status Notification Given:  Yes  Code 59 given  Ninfa Meeker, RN 03/23/2016, 11:54 AM

## 2016-03-23 NOTE — Consult Note (Signed)
ORTHOPAEDIC CONSULTATION  REQUESTING PHYSICIAN: Bonnell Public, MD  Chief Complaint: Painful draining ulcer plantar aspect right foot MTP joint  HPI: [name] is a 75 y.o. male who presents with an ulcer plantar aspect right great toe MTP joint. Patient states he stepped on a nail in April. He has been having pain in drainage since that time. He has seen Dr. Earleen Newport in podiatry and has undergone conservative therapy he has started a course of oral antibiotics for 2 days states that the pain has gotten worse so he brought himself to the emergency room for evaluation and treatment.  Past Medical History  Diagnosis Date  . Diabetes mellitus   . Hyperlipidemia   . Allergy   . Cataract   . Colon polyps 2012    Colonoscopy  . Diverticulosis 2012    Colonoscopy   . Stroke [facility])    Past Surgical History  Procedure Laterality Date  . Cataract extraction      both eyes  . Neck surgery    . Colon surgery      to repair intestines as child  . Knee arthroscopy      left  . Colonoscopy  10/20/2011    Procedure: COLONOSCOPY;  Surgeon: Inda Castle, MD;  Location: [facility] ENDOSCOPY;  Service: Endoscopy;  Laterality: N/A;  . Hot hemostasis  10/20/2011    Procedure: HOT HEMOSTASIS (ARGON PLASMA COAGULATION/BICAP);  Surgeon: Inda Castle, MD;  Location: [facility];  Service: Endoscopy;  Laterality: N/A;   Social History   Social History  . Marital Status: Married    Spouse Name: N/A  . Number of Children: 3  . Years of Education: N/A   Occupational History  . Retired    Social History Main Topics  . Smoking status: Former Smoker    Types: Cigarettes    Quit date: 02/23/2014  . Smokeless tobacco: Never Used     Comment: tobacco info given 11/07/2011  . Alcohol Use: Yes     Comment: rarely  . Drug Use: No  . Sexual Activity: Not Asked   Other Topics Concern  . None   Social History Narrative   Family History  Problem Relation Age of Onset  . Colon cancer Neg Hx    . Esophageal cancer Neg Hx   . Stomach cancer Neg Hx   . Anesthesia problems Neg Hx   . Hypotension Neg Hx   . Malignant hyperthermia Neg Hx   . Pseudochol deficiency Neg Hx   . Diabetes Mellitus II Mother    - negative except otherwise stated in the family history section No Known Allergies Prior to Admission medications   Medication Sig Start Date End Date Taking? Authorizing Provider  aspirin EC 325 MG EC tablet Take 1 tablet (325 mg total) by mouth daily. 02/29/16  Yes Barton Dubois, MD  carvedilol (COREG) 12.5 MG tablet Take one tablet by mouth daily 11/03/15  Yes [name], MD  clopidogrel (PLAVIX) 75 MG tablet Take 1 tablet (75 mg total) by mouth daily with breakfast. 04/05/15  Yes Rosalin Hawking, MD  feeding supplement, GLUCERNA SHAKE, (GLUCERNA SHAKE) LIQD Take 237 mLs by mouth daily. 02/29/16  Yes Barton Dubois, MD  furosemide (LASIX) 40 MG tablet Take 1 tablet (40 mg total) by mouth daily. 12/16/14  Yes [name], MD  glimepiride (AMARYL) 1 MG tablet Take 1 mg by mouth 2 (two) times daily.   Yes Historical Provider, MD  Iron Combinations (NIFEREX) TABS Take  150 mg by mouth daily. 02/29/16  Yes Barton Dubois, MD  losartan (COZAAR) 100 MG tablet Take 1 tablet (100 mg total) by mouth daily. 01/26/14  Yes Adeline Saralyn Pilar, MD  metFORMIN (GLUCOPHAGE) 1000 MG tablet Take 1,000 mg by mouth 2 (two) times daily.  10/11/15  Yes Historical Provider, MD  montelukast (SINGULAIR) 10 MG tablet Take 10 mg by mouth every morning.    Yes Historical Provider, MD  potassium chloride SA (K-DUR,KLOR-CON) 20 MEQ tablet Take 0.5 tablets (10 mEq total) by mouth daily. 01/06/15  Yes [name], MD  simvastatin (ZOCOR) 40 MG tablet Take 40 mg by mouth daily.    Yes Historical Provider, MD  spironolactone (ALDACTONE) 25 MG tablet Take 0.5 tablets (12.5 mg total) by mouth daily. 02/16/16  Yes [name], MD   Mr Foot Right Wo Contrast  03/23/2016  CLINICAL DATA:  Stepped on roofing tack in the yard in  April. EXAM: MRI OF THE RIGHT FOREFOOT WITHOUT CONTRAST TECHNIQUE: Multiplanar, multisequence MR imaging was performed. No intravenous contrast was administered. COMPARISON:  None. FINDINGS: Bones/Joint/Cartilage Mild osteoarthritis of the talonavicular joint with subchondral reactive marrow changes. Otherwise no marrow signal abnormality. No fracture or dislocation. Normal alignment. No joint effusion. Collaterals Collateral ligaments are intact. Tendons Flexor, peroneal and extensor compartment tendons are intact. Small amount of fluid in the flexor hallucis longus tendon sheath. Muscles Mild edema throughout the plantar musculature likely neurogenic. Soft tissue Soft tissue puncture wound along the plantar aspect at the level of the first MTP joint with severe soft tissue edema within the soft tissues deep to the puncture wound extending down to the level of the joint most consistent with severe cellulitis. No drainable fluid collection. No hematoma. IMPRESSION: 1. No osteomyelitis of the right forefoot. Severe cellulitis along the plantar aspect of the first MTP joint. 2. Mild tenosynovitis of the flexor hallucis longus. Electronically Signed   By: Kathreen Devoid   On: 03/23/2016 17:58   [place] Extrem Low Right Ltd  03/23/2016  CLINICAL DATA:  Puncture 2 months ago. EXAM: ULTRASOUND right LOWER EXTREMITY LIMITED TECHNIQUE: Ultrasound examination of the lower extremity soft tissues was performed in the area of clinical concern. COMPARISON:  Radiographs 03/22/2016 FINDINGS: Directed sonographic evaluation of the area of concern in the plantar surface of the right foot demonstrates a complex collection measuring approximately 10 x 12 x 16 mm. This is in the superficial plantar soft tissues at the level of the first MTP. It probably represents an abscess, given the clinical history, but hematoma can also have this appearance. IMPRESSION: Complex collection at the area of concern on the plantar surface of the foot,  consistent with abscess or hematoma. Electronically Signed   By: Andreas Newport M.D.   On: 03/23/2016 04:19   Dg Foot Complete Right  03/23/2016  CLINICAL DATA:  Diabetic foot wound EXAM: RIGHT FOOT COMPLETE - 3+ VIEW COMPARISON:  03/20/2016 FINDINGS: There is no evidence of fracture or dislocation. There is no evidence of arthropathy or other focal bone abnormality. Soft tissues are unremarkable. IMPRESSION: Negative. Electronically Signed   By: Andreas Newport M.D.   On: 03/23/2016 00:41   - pertinent xrays, CT, MRI studies were reviewed and independently interpreted  Positive ROS: All other systems have been reviewed and were otherwise negative with the exception of those mentioned in the HPI and as above.  Physical Exam: General: Alert, no acute distress Cardiovascular: No pedal edema Respiratory: No cyanosis, no use of accessory musculature  GI: No organomegaly, abdomen is soft and non-tender Skin: Ulcer 5 mm in diameter beneath the first metatarsal head right foot. There is some drainage from a blister along the side of the ulcer. Neurologic: Patient does not have protective sensation. Psychiatric: Patient is competent for consent with normal mood and affect Lymphatic: No axillary or cervical lymphadenopathy  MUSCULOSKELETAL:  On examination patient has calcified digital vessels seen on plain radiographs do not show any evidence of any destructive bony changes. Review of the MRI scan shows no evidence of osteomyelitis there is no signs of abscess by the MRI scan. The ultrasound is suggestive of an abscess. This may be the fluid collection beneath the skin. Patient has no ascending cellulitis. Patient does have a palpable dorsalis pedis and posterior tibial pulse.  Assessment: Assessment: Cellulitis right foot plantar aspect MTP joint without osteomyelitis  Plan: Plan: I agree with continuing the IV antibiotics. I will reevaluate Sunday or Monday to see how he is doing. If the  infection cannot be resolved with IV antibiotics his only option would be a first ray amputation. There is insufficient soft tissue for wound closure if this wound has to be extensively debrided.  Thank you for the consult and the opportunity to see Mr. Hovanes Fanta, [place] [phone] 6:28 PM

## 2016-03-24 ENCOUNTER — Observation Stay (HOSPITAL_BASED_OUTPATIENT_CLINIC_OR_DEPARTMENT_OTHER): Payer: Medicare Other

## 2016-03-24 DIAGNOSIS — M79604 Pain in right leg: Secondary | ICD-10-CM | POA: Diagnosis not present

## 2016-03-24 DIAGNOSIS — N179 Acute kidney failure, unspecified: Secondary | ICD-10-CM | POA: Diagnosis not present

## 2016-03-24 DIAGNOSIS — L03119 Cellulitis of unspecified part of limb: Secondary | ICD-10-CM | POA: Diagnosis not present

## 2016-03-24 DIAGNOSIS — I5022 Chronic systolic (congestive) heart failure: Secondary | ICD-10-CM | POA: Diagnosis not present

## 2016-03-24 DIAGNOSIS — R52 Pain, unspecified: Secondary | ICD-10-CM | POA: Diagnosis not present

## 2016-03-24 DIAGNOSIS — E1169 Type 2 diabetes mellitus with other specified complication: Secondary | ICD-10-CM | POA: Diagnosis not present

## 2016-03-24 LAB — GLUCOSE, CAPILLARY
Glucose-Capillary: 237 mg/dL — ABNORMAL HIGH (ref 65–99)
Glucose-Capillary: 169 mg/dL — ABNORMAL HIGH (ref 65–99)
Glucose-Capillary: 296 mg/dL — ABNORMAL HIGH (ref 65–99)

## 2016-03-24 LAB — CBC WITH DIFFERENTIAL/PLATELET
Basophils Absolute: 0 10*3/uL (ref 0.0–0.1)
Basophils Relative: 0 %
Eosinophils Absolute: 0.2 10*3/uL (ref 0.0–0.7)
Eosinophils Relative: 2 %
HCT: 28 % — ABNORMAL LOW (ref 39.0–52.0)
Hemoglobin: 9.1 g/dL — ABNORMAL LOW (ref 13.0–17.0)
Lymphocytes Relative: 11 %
Lymphs Abs: 1.1 10*3/uL (ref 0.7–4.0)
MCH: 29.7 pg (ref 26.0–34.0)
MCHC: 32.5 g/dL (ref 30.0–36.0)
MCV: 91.5 fL (ref 78.0–100.0)
Monocytes Absolute: 1.1 10*3/uL — ABNORMAL HIGH (ref 0.1–1.0)
Monocytes Relative: 11 %
Neutro Abs: 7 10*3/uL (ref 1.7–7.7)
Neutrophils Relative %: 76 %
Platelets: 241 10*3/uL (ref 150–400)
RBC: 3.06 MIL/uL — ABNORMAL LOW (ref 4.22–5.81)
RDW: 13.9 % (ref 11.5–15.5)
WBC: 9.3 10*3/uL (ref 4.0–10.5)

## 2016-03-24 LAB — RENAL FUNCTION PANEL
Albumin: 2.7 g/dL — ABNORMAL LOW (ref 3.5–5.0)
Albumin: 2.5 g/dL — ABNORMAL LOW (ref 3.5–5.0)
Anion gap: 6 (ref 5–15)
Anion gap: 7 (ref 5–15)
BUN: 26 mg/dL — ABNORMAL HIGH (ref 6–20)
BUN: 25 mg/dL — ABNORMAL HIGH (ref 6–20)
CO2: 23 mmol/L (ref 22–32)
CO2: 23 mmol/L (ref 22–32)
Calcium: 8.6 mg/dL — ABNORMAL LOW (ref 8.9–10.3)
Calcium: 8.6 mg/dL — ABNORMAL LOW (ref 8.9–10.3)
Chloride: 108 mmol/L (ref 101–111)
Chloride: 108 mmol/L (ref 101–111)
Creatinine, Ser: 1.7 mg/dL — ABNORMAL HIGH (ref 0.61–1.24)
Creatinine, Ser: 1.74 mg/dL — ABNORMAL HIGH (ref 0.61–1.24)
GFR calc Af Amer: 45 mL/min — ABNORMAL LOW
GFR calc Af Amer: 43 mL/min — ABNORMAL LOW (ref >60)
GFR calc non Af Amer: 38 mL/min — ABNORMAL LOW
GFR calc non Af Amer: 37 mL/min — ABNORMAL LOW (ref >60)
Glucose, Bld: 128 mg/dL — ABNORMAL HIGH (ref 65–99)
Glucose, Bld: 102 mg/dL — ABNORMAL HIGH (ref 65–99)
Phosphorus: 3.2 mg/dL (ref 2.5–4.6)
Phosphorus: 3.2 mg/dL (ref 2.5–4.6)
Potassium: 3.9 mmol/L (ref 3.5–5.1)
Potassium: 3.9 mmol/L (ref 3.5–5.1)
Sodium: 137 mmol/L (ref 135–145)
Sodium: 138 mmol/L (ref 135–145)

## 2016-03-24 LAB — VAS US LOWER EXTREMITY ARTERIAL DUPLEX
RIGHT ANT DIST TIBAL SYS PSV: 45 cm/s
RIGHT POST TIB DIST SYS: -48 cm/s
Right ant tibeal sys PSV: 37 cm/s
Right peroneal sys PSV: -82 cm/s
Right popliteal dist sys PSV: 75 cm/s
Right post tibial sys PSV: -65 cm/s
Right super femoral dist sys PSV: -78 cm/s
Right super femoral mid sys PSV: -168 cm/s
Right super femoral prox sys PSV: -50 cm/s

## 2016-03-24 MED ORDER — MUPIROCIN CALCIUM 2 % EX CREA
TOPICAL_CREAM | Freq: Two times a day (BID) | CUTANEOUS | Status: DC
  Administered 2016-03-24 – 2016-03-27 (×6): via TOPICAL
  Filled 2016-03-24 (×2): qty 15

## 2016-03-24 NOTE — Progress Notes (Signed)
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 Left sided ABI is suggestive of mild arterial insufficiency disease.     RIGHT    LEFT    PRESSURE WAVEFORM  PRESSURE WAVEFORM  BRACHIAL 170 Normal BRACHIAL 180 Normal  DP 86 Monophasic DP 151 Biphasic  PT 88 Monophasic PT 155 Biphasic  GREAT TOE  NA GREAT TOE  NA    RIGHT LEFT  ABI 0.49 0.86     [name] D, RVT 03/24/2016, 12:30 PM

## 2016-03-24 NOTE — Progress Notes (Signed)
Preliminary results by tech - Right Lower Ext. Arterial Duplex Completed. A short segment of occlusive disease in the distal superficial femoral artery with monophasic waveforms demonstrated distally. There is also a short segment of occlusive disease in the mid anterior tibial artery.  Oda Cogan, BS, RDMS, RVT

## 2016-03-24 NOTE — Progress Notes (Signed)
PROGRESS NOTE    Prajit Baltazar  [id number] DOB: 1941/08/03 DOA: 03/22/2016 PCP: Tivis Ringer, MD  Outpatient Specialists:   Brief Narrative: 75 y.o. male with medical history significant of DM, patient presents to the ED with c/o ongoing pain, drainage, from R foot wound. He stepped on a Nail which went through his shoe and into his foot in May this year. Had tetanus shot right after this, but developed infection. 2 courses of ABx as outpatient have failed. Saw podiatrist who performed X ray 2 days ago which is worrisome for possible osteo. Drainage and erythema has been worsening and spreading up his ankle since then.  Assessment & Plan:   Principal Problem:   Diabetic infection of right foot ([place]) Active Problems:   Essential hypertension   Chronic systolic CHF (congestive heart failure) (HCC)   DM2 (diabetes mellitus, type 2) (HCC)   Cellulitis and abscess of foot   Puncture wound of foot, right, complicated   Malnutrition of moderate degree   AKI (acute kidney injury) ([place])  Assessment/Plan Principal Problem:  Diabetic infection of right foot (HCC) Active Problems:  Essential hypertension  Chronic systolic CHF (congestive heart failure) (HCC)  DM2 (diabetes mellitus, type 2) (HCC)  Cellulitis and abscess of foot  Puncture wound of foot, right, complicated   Diabetic infection of R foot - Diabetic foot pathway : Zosyn and vanc. For vascular evaluation.  AKI on CKD stage 3 - Relatively stable Scr  CHF - Hold ARB and diuretics Continue ASA and Plavix for now.  DM2 - actually control isnt that bad at baseline, A1C was 7.4 earlier this month. Hold home PO hypoglycemics Moderate dose SSI ac/hs   DVT prophylaxis: Lovenox Code Status: Full Family Communication: Wife at bedside Consults called: None Admission status: Admit to inpatient  Subjective: Nil complaints. No fever or chills. No  SOB.  Objective: Filed Vitals:   03/23/16 1300 03/23/16 1706 03/23/16 2105 03/24/16 0742  BP: 129/61 129/61 154/65 149/60  Pulse: 77 77 71 75  Temp: 98.6 F (37 C)  98.8 F (37.1 C) 98.2 F (36.8 C)  TempSrc: Oral  Oral Oral  Resp: 18  18 16   Height:      Weight:      SpO2: 100%  99% 100%    Intake/Output Summary (Last 24 hours) at 03/24/16 0923 Last data filed at 03/24/16 0427  Gross per 24 hour  Intake    200 ml  Output    600 ml  Net   -400 ml   Filed Weights   03/22/16 2108  Weight: 73.029 kg (161 lb)    Examination:  General exam: Appears calm and comfortable  Respiratory system: Clear to auscultation. Respiratory effort normal. Cardiovascular system: S1 & S2 heard. Gastrointestinal system: Abdomen is nondistended, soft and nontender.  Central nervous system: Alert and oriented. No focal neurological deficits. Extremities: Ulcer right plantar surface, edema RLE  Data Reviewed: I have personally reviewed following labs and imaging studies  CBC:  Recent Labs Lab 03/20/16 0948 03/22/16 2118  WBC 9.4 13.2*  NEUTROABS 6768 10.6*  HGB 11.2* 10.7*  HCT 35.2* 32.6*  MCV 93.1 91.3  PLT 241 [id number]   Basic Metabolic Panel:  Recent Labs Lab 03/20/16 1102 03/22/16 2128 03/23/16 0858 03/24/16 0750  NA 139 133* 138 137  K 5.7* 4.5 4.2 3.9  CL 109 104 111 108  CO2 22 23 22 23   GLUCOSE 138* 261* 153* 128*  BUN 25 23* 25* 26*  CREATININE 1.57* 1.92*  1.90* 1.70*  CALCIUM 8.8 9.0 8.3* 8.6*  PHOS  --   --  3.4 3.2   GFR: Estimated Creatinine Clearance: 40.6 mL/min (by C-G formula based on Cr of 1.7). Liver Function Tests:  Recent Labs Lab 03/22/16 2128 03/23/16 0858 03/24/16 0750  AST 15  --   --   ALT 11*  --   --   ALKPHOS 62  --   --   BILITOT 0.6  --   --   PROT 6.3*  --   --   ALBUMIN 3.1* 2.5* 2.7*   No results for input(s): LIPASE, AMYLASE in the last 168 hours. No results for input(s): AMMONIA in the last 168 hours. Coagulation  Profile: No results for input(s): INR, PROTIME in the last 168 hours. Cardiac Enzymes: No results for input(s): CKTOTAL, CKMB, CKMBINDEX, TROPONINI in the last 168 hours. BNP (last 3 results) No results for input(s): PROBNP in the last 8760 hours. HbA1C: No results for input(s): HGBA1C in the last 72 hours. CBG:  Recent Labs Lab 03/23/16 0635 03/23/16 1130 03/23/16 1714 03/23/16 2109 03/24/16 0650  GLUCAP 137* 230* 186* 261* 237*   Lipid Profile: No results for input(s): CHOL, HDL, LDLCALC, TRIG, CHOLHDL, LDLDIRECT in the last 72 hours. Thyroid Function Tests: No results for input(s): TSH, T4TOTAL, FREET4, T3FREE, THYROIDAB in the last 72 hours. Anemia Panel: No results for input(s): VITAMINB12, FOLATE, FERRITIN, TIBC, IRON, RETICCTPCT in the last 72 hours. Urine analysis:    Component Value Date/Time   COLORURINE YELLOW 02/27/2016 1945   APPEARANCEUR CLEAR 02/27/2016 1945   LABSPEC 1.019 02/27/2016 1945   PHURINE 5.5 02/27/2016 1945   GLUCOSEU NEGATIVE 02/27/2016 1945   HGBUR NEGATIVE 02/27/2016 1945   BILIRUBINUR NEGATIVE 02/27/2016 1945   KETONESUR NEGATIVE 02/27/2016 1945   PROTEINUR NEGATIVE 02/27/2016 1945   UROBILINOGEN 0.2 01/23/2014 1600   NITRITE NEGATIVE 02/27/2016 1945   LEUKOCYTESUR NEGATIVE 02/27/2016 1945   Sepsis Labs: @LABRCNTIP (procalcitonin:4,lacticidven:4)  )No results found for this or any previous visit (from the past 240 hour(s)).       Radiology Studies: Mr Foot Right Wo Contrast  03/23/2016  CLINICAL DATA:  Stepped on roofing tack in the yard in April. EXAM: MRI OF THE RIGHT FOREFOOT WITHOUT CONTRAST TECHNIQUE: Multiplanar, multisequence MR imaging was performed. No intravenous contrast was administered. COMPARISON:  None. FINDINGS: Bones/Joint/Cartilage Mild osteoarthritis of the talonavicular joint with subchondral reactive marrow changes. Otherwise no marrow signal abnormality. No fracture or dislocation. Normal alignment. No joint  effusion. Collaterals Collateral ligaments are intact. Tendons Flexor, peroneal and extensor compartment tendons are intact. Small amount of fluid in the flexor hallucis longus tendon sheath. Muscles Mild edema throughout the plantar musculature likely neurogenic. Soft tissue Soft tissue puncture wound along the plantar aspect at the level of the first MTP joint with severe soft tissue edema within the soft tissues deep to the puncture wound extending down to the level of the joint most consistent with severe cellulitis. No drainable fluid collection. No hematoma. IMPRESSION: 1. No osteomyelitis of the right forefoot. Severe cellulitis along the plantar aspect of the first MTP joint. 2. Mild tenosynovitis of the flexor hallucis longus. Electronically Signed   By: Kathreen Devoid   On: 03/23/2016 17:58   [place] Extrem Low Right Ltd  03/23/2016  CLINICAL DATA:  Puncture 2 months ago. EXAM: ULTRASOUND right LOWER EXTREMITY LIMITED TECHNIQUE: Ultrasound examination of the lower extremity soft tissues was performed in the area of clinical concern. COMPARISON:  Radiographs 03/22/2016 FINDINGS: Directed sonographic  evaluation of the area of concern in the plantar surface of the right foot demonstrates a complex collection measuring approximately 10 x 12 x 16 mm. This is in the superficial plantar soft tissues at the level of the first MTP. It probably represents an abscess, given the clinical history, but hematoma can also have this appearance. IMPRESSION: Complex collection at the area of concern on the plantar surface of the foot, consistent with abscess or hematoma. Electronically Signed   By: Andreas Newport M.D.   On: 03/23/2016 04:19   Dg Foot Complete Right  03/23/2016  CLINICAL DATA:  Diabetic foot wound EXAM: RIGHT FOOT COMPLETE - 3+ VIEW COMPARISON:  03/20/2016 FINDINGS: There is no evidence of fracture or dislocation. There is no evidence of arthropathy or other focal bone abnormality. Soft tissues are  unremarkable. IMPRESSION: Negative. Electronically Signed   By: Andreas Newport M.D.   On: 03/23/2016 00:41        Scheduled Meds: . aspirin EC  325 mg Oral Daily  . carvedilol  6.25 mg Oral BID WC  . clopidogrel  75 mg Oral Q breakfast  . enoxaparin (LOVENOX) injection  40 mg Subcutaneous Q24H  . feeding supplement (GLUCERNA SHAKE)  237 mL Oral TID BM  . insulin aspart  0-15 Units Subcutaneous TID WC  . montelukast  10 mg Oral q morning - 10a  . mupirocin cream   Topical BID  . piperacillin-tazobactam (ZOSYN)  IV  3.375 g Intravenous Q8H  . simvastatin  40 mg Oral Daily  . vancomycin  750 mg Intravenous Q24H   Continuous Infusions:    LOS: 1 day    Time spent: 25 Mins    Dana Allan, MD  [facility] Pager #: [phone] 7PM-7AM contact night coverage as above

## 2016-03-25 DIAGNOSIS — E1149 Type 2 diabetes mellitus with other diabetic neurological complication: Secondary | ICD-10-CM

## 2016-03-25 DIAGNOSIS — S91331S Puncture wound without foreign body, right foot, sequela: Secondary | ICD-10-CM

## 2016-03-25 DIAGNOSIS — E440 Moderate protein-calorie malnutrition: Secondary | ICD-10-CM

## 2016-03-25 DIAGNOSIS — I739 Peripheral vascular disease, unspecified: Secondary | ICD-10-CM

## 2016-03-25 DIAGNOSIS — E1169 Type 2 diabetes mellitus with other specified complication: Secondary | ICD-10-CM | POA: Diagnosis not present

## 2016-03-25 DIAGNOSIS — I10 Essential (primary) hypertension: Secondary | ICD-10-CM

## 2016-03-25 DIAGNOSIS — L03119 Cellulitis of unspecified part of limb: Secondary | ICD-10-CM | POA: Diagnosis not present

## 2016-03-25 DIAGNOSIS — N179 Acute kidney failure, unspecified: Secondary | ICD-10-CM | POA: Diagnosis not present

## 2016-03-25 LAB — GLUCOSE, CAPILLARY
Glucose-Capillary: 194 mg/dL — ABNORMAL HIGH (ref 65–99)
Glucose-Capillary: 303 mg/dL — ABNORMAL HIGH (ref 65–99)
Glucose-Capillary: 210 mg/dL — ABNORMAL HIGH (ref 65–99)
Glucose-Capillary: 268 mg/dL — ABNORMAL HIGH (ref 65–99)
Glucose-Capillary: 224 mg/dL — ABNORMAL HIGH (ref 65–99)

## 2016-03-25 LAB — VANCOMYCIN, TROUGH: Vancomycin Tr: 11 ug/mL — ABNORMAL LOW (ref 15–20)

## 2016-03-25 MED ORDER — DOCUSATE SODIUM 100 MG PO CAPS
100.0000 mg | ORAL_CAPSULE | Freq: Two times a day (BID) | ORAL | Status: DC
  Administered 2016-03-25 – 2016-03-27 (×4): 100 mg via ORAL
  Filled 2016-03-25 (×4): qty 1

## 2016-03-25 MED ORDER — POLYETHYLENE GLYCOL 3350 17 G PO PACK
17.0000 g | PACK | Freq: Every day | ORAL | Status: DC
  Administered 2016-03-25 – 2016-03-27 (×3): 17 g via ORAL
  Filled 2016-03-25 (×3): qty 1

## 2016-03-25 MED ORDER — HYDRALAZINE HCL 25 MG PO TABS
25.0000 mg | ORAL_TABLET | Freq: Four times a day (QID) | ORAL | Status: DC | PRN
  Administered 2016-03-27: 25 mg via ORAL
  Filled 2016-03-25: qty 1

## 2016-03-25 NOTE — Care Management Note (Signed)
Case Management Note  Patient Details  Name: [name] MRN: [id number] Date of Birth: Nov 26, 1940  Subjective/Objective: 75 yo M with an ulcer plantar aspect right great toe MTP joint. Pt stepped on a nail in April. He is receiving IV antibiotics. If the infection cannot be resolved with IV antibiotics his only option would be a first ray amputation. There is insufficient soft tissue for wound closure if this wound has to be extensively debrided.   Action/Plan: received referral to assist with [facility] needs and DME   Expected Discharge Date:                  Expected Discharge Plan:     In-House Referral:     Discharge planning Services  CM Consult  Post Acute Care Choice:    Choice offered to:     DME Arranged:    DME Agency:     HH Arranged:    [place] Agency:     Status of Service:  In process, will continue to follow  If discussed at Long Length of Stay Meetings, dates discussed:    Additional Comments: pt is not ready to be d/c. He may need a first ray amputation if the infection cannot be resolved with IV antibiotics. Will continue to f/u to assist with the d/c needs.    Norina Buzzard, RN 03/25/2016, 11:16 AM

## 2016-03-25 NOTE — Progress Notes (Signed)
PROGRESS NOTE    [name]  [id number] DOB: 04-23-1941 DOA: 03/22/2016 PCP: Tivis Ringer, MD  Outpatient Specialists:   Brief Narrative: 75 y.o. male with medical history significant of DM, patient presents to the ED with c/o ongoing pain, drainage, from R foot wound. He stepped on a Nail which went through his shoe and into his foot in May this year. Had tetanus shot right after this, but developed infection. 2 courses of ABx as outpatient have failed. Saw podiatrist who performed X ray 2 days ago which is worrisome for possible osteo. Drainage and erythema has been worsening and spreading up his ankle since then.  Assessment & Plan:   Principal Problem:   Diabetic infection of right foot ([place]) Active Problems:   Essential hypertension   Chronic systolic CHF (congestive heart failure) (HCC)   DM2 (diabetes mellitus, type 2) (HCC)   Cellulitis and abscess of foot   Puncture wound of foot, right, complicated   Malnutrition of moderate degree   AKI (acute kidney injury) ([place])  Assessment/Plan Principal Problem:  Diabetic infection of right foot (HCC) Active Problems:  Essential hypertension  Chronic systolic CHF (congestive heart failure) (HCC)  DM2 (diabetes mellitus, type 2) (HCC)  Cellulitis and abscess of foot  Puncture wound of foot, right, complicated   Diabetic infection of R foot - Diabetic foot pathway : Zosyn and vanc. Right leg ABI was 0.49 and left leg 0.86.  AKI on CKD stage 3 - Relatively stable Scr  CHF - Hold ARB and diuretics Continue ASA and Plavix for now.  DM2 - actually control isnt that bad at baseline, A1C was 7.4 earlier this month. Hold home PO hypoglycemics Moderate dose SSI ac/hs  PAD - Will defer to the Surgery team.   DVT prophylaxis: Lovenox Code Status: Full Family Communication: Wife at bedside Consults called: None Admission status: Admit to  inpatient  Subjective: Nil complaints. No fever or chills. No SOB.  Objective: Filed Vitals:   03/24/16 1401 03/24/16 1705 03/24/16 2100 03/25/16 0527  BP: 147/83 140/85 184/58 161/57  Pulse: 75 100 78 78  Temp: 98.6 F (37 C)  98.6 F (37 C) 99.9 F (37.7 C)  TempSrc: Oral  Oral Oral  Resp: 16  16 15   Height:      Weight:      SpO2: 100%  100% 99%    Intake/Output Summary (Last 24 hours) at 03/25/16 0918 Last data filed at 03/25/16 0833  Gross per 24 hour  Intake    315 ml  Output    600 ml  Net   -285 ml   Filed Weights   03/22/16 2108  Weight: 73.029 kg (161 lb)    Examination:  General exam: Appears calm and comfortable  Respiratory system: Clear to auscultation. Respiratory effort normal. Cardiovascular system: S1 & S2 heard. Gastrointestinal system: Abdomen is nondistended, soft and nontender.  Central nervous system: Alert and oriented. No focal neurological deficits. Extremities: Ulcer right plantar surface, edema RLE  Data Reviewed: I have personally reviewed following labs and imaging studies  CBC:  Recent Labs Lab 03/20/16 0948 03/22/16 2118 03/24/16 0925  WBC 9.4 13.2* 9.3  NEUTROABS 6768 10.6* 7.0  HGB 11.2* 10.7* 9.1*  HCT 35.2* 32.6* 28.0*  MCV 93.1 91.3 91.5  PLT 241 247 [id number]   Basic Metabolic Panel:  Recent Labs Lab 03/20/16 1102 03/22/16 2128 03/23/16 0858 03/24/16 0750 03/24/16 0925  NA 139 133* 138 137 138  K 5.7* 4.5 4.2 3.9 3.9  CL 109 104 111 108 108  CO2 22 23 22 23 23   GLUCOSE 138* 261* 153* 128* 102*  BUN 25 23* 25* 26* 25*  CREATININE 1.57* 1.92* 1.90* 1.70* 1.74*  CALCIUM 8.8 9.0 8.3* 8.6* 8.6*  PHOS  --   --  3.4 3.2 3.2   GFR: Estimated Creatinine Clearance: 39.6 mL/min (by C-G formula based on Cr of 1.74). Liver Function Tests:  Recent Labs Lab 03/22/16 2128 03/23/16 0858 03/24/16 0750 03/24/16 0925  AST 15  --   --   --   ALT 11*  --   --   --   ALKPHOS 62  --   --   --   BILITOT 0.6  --   --   --    PROT 6.3*  --   --   --   ALBUMIN 3.1* 2.5* 2.7* 2.5*   No results for input(s): LIPASE, AMYLASE in the last 168 hours. No results for input(s): AMMONIA in the last 168 hours. Coagulation Profile: No results for input(s): INR, PROTIME in the last 168 hours. Cardiac Enzymes: No results for input(s): CKTOTAL, CKMB, CKMBINDEX, TROPONINI in the last 168 hours. BNP (last 3 results) No results for input(s): PROBNP in the last 8760 hours. HbA1C: No results for input(s): HGBA1C in the last 72 hours. CBG:  Recent Labs Lab 03/24/16 0650 03/24/16 1301 03/24/16 1648 03/24/16 2231 03/25/16 0643  GLUCAP 237* 169* 296* 194* 303*   Lipid Profile: No results for input(s): CHOL, HDL, LDLCALC, TRIG, CHOLHDL, LDLDIRECT in the last 72 hours. Thyroid Function Tests: No results for input(s): TSH, T4TOTAL, FREET4, T3FREE, THYROIDAB in the last 72 hours. Anemia Panel: No results for input(s): VITAMINB12, FOLATE, FERRITIN, TIBC, IRON, RETICCTPCT in the last 72 hours. Urine analysis:    Component Value Date/Time   COLORURINE YELLOW 02/27/2016 1945   APPEARANCEUR CLEAR 02/27/2016 1945   LABSPEC 1.019 02/27/2016 1945   PHURINE 5.5 02/27/2016 1945   GLUCOSEU NEGATIVE 02/27/2016 1945   HGBUR NEGATIVE 02/27/2016 1945   BILIRUBINUR NEGATIVE 02/27/2016 1945   KETONESUR NEGATIVE 02/27/2016 1945   PROTEINUR NEGATIVE 02/27/2016 1945   UROBILINOGEN 0.2 01/23/2014 1600   NITRITE NEGATIVE 02/27/2016 1945   LEUKOCYTESUR NEGATIVE 02/27/2016 1945   Sepsis Labs: @LABRCNTIP (procalcitonin:4,lacticidven:4)  ) Recent Results (from the past 240 hour(s))  Blood Cultures x 2 sites     Status: None (Preliminary result)   Collection Time: 03/22/16 10:30 PM  Result Value Ref Range Status   Specimen Description BLOOD RIGHT FOREARM  Final   Special Requests BOTTLES DRAWN AEROBIC AND ANAEROBIC 5ML  Final   Culture NO GROWTH 1 DAY  Final   Report Status PENDING  Incomplete  Blood Cultures x 2 sites     Status:  None (Preliminary result)   Collection Time: 03/23/16 12:50 AM  Result Value Ref Range Status   Specimen Description BLOOD LEFT ARM  Final   Special Requests AEROBIC BOTTLE ONLY 10ML  Final   Culture NO GROWTH 1 DAY  Final   Report Status PENDING  Incomplete         Radiology Studies: Mr Foot Right Wo Contrast  03/23/2016  CLINICAL DATA:  Stepped on roofing tack in the yard in April. EXAM: MRI OF THE RIGHT FOREFOOT WITHOUT CONTRAST TECHNIQUE: Multiplanar, multisequence MR imaging was performed. No intravenous contrast was administered. COMPARISON:  None. FINDINGS: Bones/Joint/Cartilage Mild osteoarthritis of the talonavicular joint with subchondral reactive marrow changes. Otherwise no marrow signal abnormality. No fracture or dislocation. Normal alignment. No  joint effusion. Collaterals Collateral ligaments are intact. Tendons Flexor, peroneal and extensor compartment tendons are intact. Small amount of fluid in the flexor hallucis longus tendon sheath. Muscles Mild edema throughout the plantar musculature likely neurogenic. Soft tissue Soft tissue puncture wound along the plantar aspect at the level of the first MTP joint with severe soft tissue edema within the soft tissues deep to the puncture wound extending down to the level of the joint most consistent with severe cellulitis. No drainable fluid collection. No hematoma. IMPRESSION: 1. No osteomyelitis of the right forefoot. Severe cellulitis along the plantar aspect of the first MTP joint. 2. Mild tenosynovitis of the flexor hallucis longus. Electronically Signed   By: Kathreen Devoid   On: 03/23/2016 17:58        Scheduled Meds: . aspirin EC  325 mg Oral Daily  . carvedilol  6.25 mg Oral BID WC  . clopidogrel  75 mg Oral Q breakfast  . enoxaparin (LOVENOX) injection  40 mg Subcutaneous Q24H  . feeding supplement (GLUCERNA SHAKE)  237 mL Oral TID BM  . insulin aspart  0-15 Units Subcutaneous TID WC  . montelukast  10 mg Oral q morning  - 10a  . mupirocin cream   Topical BID  . piperacillin-tazobactam (ZOSYN)  IV  3.375 g Intravenous Q8H  . simvastatin  40 mg Oral Daily  . vancomycin  750 mg Intravenous Q24H   Continuous Infusions:    LOS: 2 days    Time spent: 25 Mins    Dana Allan, MD  [facility] Pager #: [phone] 7PM-7AM contact night coverage as above

## 2016-03-25 NOTE — Progress Notes (Signed)
Pharmacy Antibiotic Note  [name] is a 75 y.o. male admitted on 03/22/2016 with R foot wound infection.  Pharmacy has been consulted for Vancomycin and Zosyn dosing. Pt on po cipro and cephalexin PTA since 6/26.  Orthopedics following- to have amputation if no improvement on antibiotics.  VT tonight in range at 74mcg/mL.  Plan: -Zosyn 3.375gm IV q8h EI -Vancomycin 750mg  IV q24h -Will f/u micro data, renal function, and pt's clinical condition   Height: 5\' 10"  (177.8 cm) Weight: 161 lb (73.029 kg) IBW/kg (Calculated) : 73  Temp (24hrs), Avg:99.8 F (37.7 C), Min:99.3 F (37.4 C), Max:100.1 F (37.8 C)   Recent Labs Lab 03/20/16 0948 03/20/16 1102 03/22/16 2118 03/22/16 2128 03/22/16 2133 03/23/16 0100 03/23/16 0858 03/24/16 0750 03/24/16 0925 03/25/16 2113  WBC 9.4  --  13.2*  --   --   --   --   --  9.3  --   CREATININE  --  1.57*  --  1.92*  --   --  1.90* 1.70* 1.74*  --   LATICACIDVEN  --   --   --   --  0.88 0.80  --   --   --   --   VANCOTROUGH  --   --   --   --   --   --   --   --   --  11*    Estimated Creatinine Clearance: 39.6 mL/min (by C-G formula based on Cr of 1.74).    No Known Allergies  Antimicrobials this admission: Vanc 6/29>>  Zosyn 6/29 >>   Dose adjustments this admission: 7/1 VT = 74mcg/mL on 750mg  IV q24h  Microbiology results: 6/29 BCx x2: ngtd 6/26 R foot wound cx (o/p): no results yet  Thank you for allowing pharmacy to be a part of this patient's care.  Valor [place] D. Tekeisha Hakim, PharmD, BCPS Clinical Pharmacist Pager: [phone] 03/25/2016 10:17 PM

## 2016-03-26 DIAGNOSIS — L03119 Cellulitis of unspecified part of limb: Secondary | ICD-10-CM | POA: Diagnosis not present

## 2016-03-26 DIAGNOSIS — I5022 Chronic systolic (congestive) heart failure: Secondary | ICD-10-CM | POA: Diagnosis not present

## 2016-03-26 DIAGNOSIS — E1169 Type 2 diabetes mellitus with other specified complication: Secondary | ICD-10-CM | POA: Diagnosis not present

## 2016-03-26 DIAGNOSIS — N179 Acute kidney failure, unspecified: Secondary | ICD-10-CM | POA: Diagnosis not present

## 2016-03-26 LAB — RENAL FUNCTION PANEL
Albumin: 2.2 g/dL — ABNORMAL LOW (ref 3.5–5.0)
Anion gap: 7 (ref 5–15)
BUN: 28 mg/dL — ABNORMAL HIGH (ref 6–20)
CO2: 23 mmol/L (ref 22–32)
Calcium: 8.3 mg/dL — ABNORMAL LOW (ref 8.9–10.3)
Chloride: 106 mmol/L (ref 101–111)
Creatinine, Ser: 1.53 mg/dL — ABNORMAL HIGH (ref 0.61–1.24)
GFR calc Af Amer: 51 mL/min — ABNORMAL LOW (ref >60)
GFR calc non Af Amer: 44 mL/min — ABNORMAL LOW (ref >60)
Glucose, Bld: 316 mg/dL — ABNORMAL HIGH (ref 65–99)
Phosphorus: 3.3 mg/dL (ref 2.5–4.6)
Potassium: 4.3 mmol/L (ref 3.5–5.1)
Sodium: 136 mmol/L (ref 135–145)

## 2016-03-26 LAB — GLUCOSE, CAPILLARY
Glucose-Capillary: 325 mg/dL — ABNORMAL HIGH (ref 65–99)
Glucose-Capillary: 208 mg/dL — ABNORMAL HIGH (ref 65–99)
Glucose-Capillary: 273 mg/dL — ABNORMAL HIGH (ref 65–99)
Glucose-Capillary: 170 mg/dL — ABNORMAL HIGH (ref 65–99)

## 2016-03-26 MED ORDER — INSULIN GLARGINE 100 UNIT/ML ~~LOC~~ SOLN
10.0000 [IU] | Freq: Every day | SUBCUTANEOUS | Status: DC
  Administered 2016-03-26 – 2016-03-27 (×2): 10 [IU] via SUBCUTANEOUS
  Filled 2016-03-26 (×3): qty 0.1

## 2016-03-26 NOTE — Progress Notes (Signed)
PROGRESS NOTE    [name]  [id number] DOB: 1941/06/19 DOA: 03/22/2016 PCP: Tivis Ringer, MD  Outpatient Specialists:   Brief Narrative: 75 y.o. male with medical history significant of DM, patient presents to the ED with c/o ongoing pain, drainage, from R foot wound. He stepped on a Nail which went through his shoe and into his foot in May this year. Had tetanus shot right after this, but developed infection. 2 courses of ABx as outpatient have failed. Saw podiatrist who performed X ray 2 days ago which is worrisome for possible osteo. Drainage and erythema has been worsening and spreading up his ankle since then.  Assessment & Plan:   Principal Problem:   Diabetic infection of right foot ([place]) Active Problems:   Essential hypertension   Chronic systolic CHF (congestive heart failure) (HCC)   DM2 (diabetes mellitus, type 2) (HCC)   Cellulitis and abscess of foot   Puncture wound of foot, right, complicated   Malnutrition of moderate degree   AKI (acute kidney injury) ([place])  Assessment/Plan Principal Problem:  Diabetic infection of right foot (HCC) Active Problems:  Essential hypertension  Chronic systolic CHF (congestive heart failure) (HCC)  DM2 (diabetes mellitus, type 2) (HCC)  Cellulitis and abscess of foot  Puncture wound of foot, right, complicated   Diabetic infection of R foot - Diabetic foot pathway : Zosyn and vanc. Right leg ABI was 0.49 and left leg 0.86.  AKI on CKD stage 3 - Relatively stable Scr  CHF - Hold ARB and diuretics Continue ASA and Plavix for now.  DM2 - actually control isnt that bad at baseline, A1C was 7.4 earlier this month. Hold home PO hypoglycemics Moderate dose SSI ac/hs  PAD - Will defer to the Surgery team.   DVT prophylaxis: Lovenox Code Status: Full Family Communication: Wife at bedside Consults called: None Admission status: Admit to  inpatient  Subjective: Seen alongside patient's Daughter. Nil complaints. No fever or chills. No SOB.  Objective: Filed Vitals:   03/25/16 0527 03/25/16 1422 03/25/16 2036 03/26/16 0533  BP: 161/57 133/60 109/60 163/51  Pulse: 78 71 83 67  Temp: 99.9 F (37.7 C) 99.3 F (37.4 C) 100.1 F (37.8 C) 98.8 F (37.1 C)  TempSrc: Oral Oral Oral Oral  Resp: 15 16 16 16   Height:      Weight:      SpO2: 99% 100% 100% 100%    Intake/Output Summary (Last 24 hours) at 03/26/16 0913 Last data filed at 03/26/16 0534  Gross per 24 hour  Intake    120 ml  Output    600 ml  Net   -480 ml   Filed Weights   03/22/16 2108  Weight: 73.029 kg (161 lb)    Examination:  General exam: Appears calm and comfortable  Respiratory system: Clear to auscultation. Respiratory effort normal. Cardiovascular system: S1 & S2 heard. Gastrointestinal system: Abdomen is nondistended, soft and nontender.  Central nervous system: Alert and oriented. No focal neurological deficits. Extremities: Ulcer right plantar surface, edema RLE  Data Reviewed: I have personally reviewed following labs and imaging studies  CBC:  Recent Labs Lab 03/20/16 0948 03/22/16 2118 03/24/16 0925  WBC 9.4 13.2* 9.3  NEUTROABS 6768 10.6* 7.0  HGB 11.2* 10.7* 9.1*  HCT 35.2* 32.6* 28.0*  MCV 93.1 91.3 91.5  PLT 241 247 [id number]   Basic Metabolic Panel:  Recent Labs Lab 03/22/16 2128 03/23/16 0858 03/24/16 0750 03/24/16 0925 03/26/16 0424  NA 133* 138 137 138 136  K 4.5 4.2 3.9 3.9 4.3  CL 104 111 108 108 106  CO2 23 22 23 23 23   GLUCOSE 261* 153* 128* 102* 316*  BUN 23* 25* 26* 25* 28*  CREATININE 1.92* 1.90* 1.70* 1.74* 1.53*  CALCIUM 9.0 8.3* 8.6* 8.6* 8.3*  PHOS  --  3.4 3.2 3.2 3.3   GFR: Estimated Creatinine Clearance: 45.1 mL/min (by C-G formula based on Cr of 1.53). Liver Function Tests:  Recent Labs Lab 03/22/16 2128 03/23/16 0858 03/24/16 0750 03/24/16 0925 03/26/16 0424  AST 15  --   --   --    --   ALT 11*  --   --   --   --   ALKPHOS 62  --   --   --   --   BILITOT 0.6  --   --   --   --   PROT 6.3*  --   --   --   --   ALBUMIN 3.1* 2.5* 2.7* 2.5* 2.2*   No results for input(s): LIPASE, AMYLASE in the last 168 hours. No results for input(s): AMMONIA in the last 168 hours. Coagulation Profile: No results for input(s): INR, PROTIME in the last 168 hours. Cardiac Enzymes: No results for input(s): CKTOTAL, CKMB, CKMBINDEX, TROPONINI in the last 168 hours. BNP (last 3 results) No results for input(s): PROBNP in the last 8760 hours. HbA1C: No results for input(s): HGBA1C in the last 72 hours. CBG:  Recent Labs Lab 03/25/16 0643 03/25/16 1141 03/25/16 1630 03/25/16 2053 03/26/16 0634  GLUCAP 303* 210* 268* 224* 325*   Lipid Profile: No results for input(s): CHOL, HDL, LDLCALC, TRIG, CHOLHDL, LDLDIRECT in the last 72 hours. Thyroid Function Tests: No results for input(s): TSH, T4TOTAL, FREET4, T3FREE, THYROIDAB in the last 72 hours. Anemia Panel: No results for input(s): VITAMINB12, FOLATE, FERRITIN, TIBC, IRON, RETICCTPCT in the last 72 hours. Urine analysis:    Component Value Date/Time   COLORURINE YELLOW 02/27/2016 1945   APPEARANCEUR CLEAR 02/27/2016 1945   LABSPEC 1.019 02/27/2016 1945   PHURINE 5.5 02/27/2016 1945   GLUCOSEU NEGATIVE 02/27/2016 1945   HGBUR NEGATIVE 02/27/2016 1945   BILIRUBINUR NEGATIVE 02/27/2016 1945   KETONESUR NEGATIVE 02/27/2016 1945   PROTEINUR NEGATIVE 02/27/2016 1945   UROBILINOGEN 0.2 01/23/2014 1600   NITRITE NEGATIVE 02/27/2016 1945   LEUKOCYTESUR NEGATIVE 02/27/2016 1945   Sepsis Labs: @LABRCNTIP (procalcitonin:4,lacticidven:4)  ) Recent Results (from the past 240 hour(s))  Blood Cultures x 2 sites     Status: None (Preliminary result)   Collection Time: 03/22/16 10:30 PM  Result Value Ref Range Status   Specimen Description BLOOD RIGHT FOREARM  Final   Special Requests BOTTLES DRAWN AEROBIC AND ANAEROBIC 5ML  Final    Culture NO GROWTH 2 DAYS  Final   Report Status PENDING  Incomplete  Blood Cultures x 2 sites     Status: None (Preliminary result)   Collection Time: 03/23/16 12:50 AM  Result Value Ref Range Status   Specimen Description BLOOD LEFT ARM  Final   Special Requests AEROBIC BOTTLE ONLY 10ML  Final   Culture NO GROWTH 2 DAYS  Final   Report Status PENDING  Incomplete         Radiology Studies: No results found.      Scheduled Meds: . aspirin EC  325 mg Oral Daily  . carvedilol  6.25 mg Oral BID WC  . clopidogrel  75 mg Oral Q breakfast  . docusate sodium  100 mg Oral BID  .  enoxaparin (LOVENOX) injection  40 mg Subcutaneous Q24H  . feeding supplement (GLUCERNA SHAKE)  237 mL Oral TID BM  . insulin aspart  0-15 Units Subcutaneous TID WC  . insulin glargine  10 Units Subcutaneous Daily  . montelukast  10 mg Oral q morning - 10a  . mupirocin cream   Topical BID  . piperacillin-tazobactam (ZOSYN)  IV  3.375 g Intravenous Q8H  . polyethylene glycol  17 g Oral Daily  . simvastatin  40 mg Oral Daily  . vancomycin  750 mg Intravenous Q24H   Continuous Infusions:    LOS: 3 days    Time spent: 25 Mins    Dana Allan, MD  [facility] Pager #: [phone] 7PM-7AM contact night coverage as above

## 2016-03-27 ENCOUNTER — Ambulatory Visit: Payer: Medicare Other | Admitting: Occupational Therapy

## 2016-03-27 ENCOUNTER — Ambulatory Visit: Payer: Medicare Other | Admitting: Podiatry

## 2016-03-27 DIAGNOSIS — E1149 Type 2 diabetes mellitus with other diabetic neurological complication: Secondary | ICD-10-CM | POA: Diagnosis not present

## 2016-03-27 DIAGNOSIS — N179 Acute kidney failure, unspecified: Secondary | ICD-10-CM | POA: Diagnosis not present

## 2016-03-27 DIAGNOSIS — L03119 Cellulitis of unspecified part of limb: Secondary | ICD-10-CM | POA: Diagnosis not present

## 2016-03-27 DIAGNOSIS — E1169 Type 2 diabetes mellitus with other specified complication: Secondary | ICD-10-CM | POA: Diagnosis not present

## 2016-03-27 LAB — GLUCOSE, CAPILLARY
Glucose-Capillary: 158 mg/dL — ABNORMAL HIGH (ref 65–99)
Glucose-Capillary: 301 mg/dL — ABNORMAL HIGH (ref 65–99)

## 2016-03-27 MED ORDER — MUPIROCIN CALCIUM 2 % EX CREA: TOPICAL_CREAM | Freq: Two times a day (BID) | CUTANEOUS | Status: AC

## 2016-03-27 MED ORDER — INSULIN GLARGINE 100 UNIT/ML ~~LOC~~ SOLN: 10.0000 [IU] | Freq: Every day | SUBCUTANEOUS | Status: DC

## 2016-03-27 MED ORDER — HYDROCODONE-ACETAMINOPHEN 5-325 MG PO TABS: 1.0000 | ORAL_TABLET | Freq: Four times a day (QID) | ORAL | Status: DC | PRN

## 2016-03-27 MED ORDER — CARVEDILOL 6.25 MG PO TABS: 6.2500 mg | ORAL_TABLET | Freq: Two times a day (BID) | ORAL | Status: DC

## 2016-03-27 MED ORDER — DOXYCYCLINE HYCLATE 50 MG PO CAPS: 100.0000 mg | ORAL_CAPSULE | Freq: Two times a day (BID) | ORAL | Status: DC

## 2016-03-27 MED ORDER — POLYETHYLENE GLYCOL 3350 17 G PO PACK: 17.0000 g | PACK | Freq: Every day | ORAL | Status: DC

## 2016-03-27 MED ORDER — DOCUSATE SODIUM 100 MG PO CAPS: 100.0000 mg | ORAL_CAPSULE | Freq: Two times a day (BID) | ORAL | Status: DC

## 2016-03-27 NOTE — Evaluation (Signed)
Physical Therapy Evaluation Patient Details Name: [name] MRN: [id number] DOB: [birth year] Today's Date: 03/27/2016   History of Present Illness  75 y.o. male with medical history significant of DM and stroke who presented with on going pain, drainage, from R foot wound.  Clinical Impression  Pt seen for initial evaluation and treatment including ambulating 170 ft with SPC and supervision as well as up/down stairs. By end of session, the patient states that he feels confident with his mobility at home and he denies any questions or concerns. Pt refusing trial of rw and use of darco shoe. PT to follow acutely for mobility progression.     Follow Up Recommendations No PT follow up;Supervision for mobility/OOB    Equipment Recommendations  None recommended by PT (pt declines rw)    Recommendations for Other Services       Precautions / Restrictions Precautions Precaution Comments: Rt foot wound Restrictions Weight Bearing Restrictions: No      Mobility  Bed Mobility Overal bed mobility: Independent             General bed mobility comments: supine to sit, bed flat  Transfers Overall transfer level: Independent Equipment used: None             General transfer comment: no instability with standing  Ambulation/Gait Ambulation/Gait assistance: Supervision Ambulation Distance (Feet): 170 Feet Assistive device: Straight cane Gait Pattern/deviations: Step-through pattern;Decreased step length - right;Decreased weight shift to right Gait velocity: decreased   General Gait Details: Pt declining use of darco shoe, choosing to bear weight through lateral aspect of Rt foot. Pt without loss of balance during ambulation.   Stairs Stairs: Yes Stairs assistance: Supervision Stair Management: One rail Left;Forwards;Backwards;With cane Number of Stairs: 5 General stair comments: going up forward and down backwards. Pt reports feeling confident with performing stairs at  home.   Wheelchair Mobility    Modified Rankin (Stroke Patients Only)       Balance Overall balance assessment: Needs assistance Sitting-balance support: No upper extremity supported Sitting balance-Leahy Scale: Normal     Standing balance support: Single extremity supported Standing balance-Leahy Scale: Fair Standing balance comment: using SPC when standing.                              Pertinent Vitals/Pain Pain Assessment: 0-10 Pain Score: 5  Pain Location: Rt foot Pain Descriptors / Indicators: Pins and needles;Shooting Pain Intervention(s): Monitored during session;Limited activity within patient's tolerance    Home Living Family/patient expects to be discharged to:: Private residence Living Arrangements: Spouse/significant other Available Help at Discharge: Family;Available PRN/intermittently Type of Home: House Home Access: Stairs to enter Entrance Stairs-Rails: Right;Left;Can reach both Entrance Stairs-Number of Steps: 7 Home Layout: Two level;Able to live on main level with bedroom/bathroom Home Equipment: Kasandra Knudsen - single point Additional Comments: Spouse does work.     Prior Function Level of Independence: Independent               Hand Dominance        Extremity/Trunk Assessment   Upper Extremity Assessment: Overall WFL for tasks assessed           Lower Extremity Assessment: Overall WFL for tasks assessed         Communication   Communication: No difficulties  Cognition Arousal/Alertness: Awake/alert Behavior During Therapy: WFL for tasks assessed/performed Overall Cognitive Status: Within Functional Limits for tasks assessed  General Comments General comments (skin integrity, edema, etc.): pt reports that he feels confident with going home.     Exercises        Assessment/Plan    PT Assessment Patient needs continued PT services  PT Diagnosis Difficulty walking   PT Problem List  Decreased strength;Decreased range of motion;Decreased activity tolerance;Decreased balance;Decreased mobility  PT Treatment Interventions DME instruction;Gait training;Functional mobility training;Stair training;Therapeutic activities;Therapeutic exercise   PT Goals (Current goals can be found in the Care Plan section) Acute Rehab PT Goals Patient Stated Goal: go home PT Goal Formulation: With patient Time For Goal Achievement: 04/10/16 Potential to Achieve Goals: Good    Frequency Min 3X/week   Barriers to discharge        Co-evaluation               End of Session Equipment Utilized During Treatment: Gait belt Activity Tolerance: Patient tolerated treatment well Patient left: in chair;with call bell/phone within reach Nurse Communication: Mobility status    Functional Assessment Tool Used: clinical judgment Functional Limitation: Mobility: Walking and moving around Mobility: Walking and Moving Around Current Status [phone]): At least 1 percent but less than 20 percent impaired, limited or restricted Mobility: Walking and Moving Around Goal Status [phone]): At least 1 percent but less than 20 percent impaired, limited or restricted    Time: 1307-1332 PT Time Calculation (min) (ACUTE ONLY): 25 min   Charges:   PT Evaluation $PT Eval Moderate Complexity: 1 Procedure PT Treatments $Gait Training: 8-22 mins   PT G Codes:   PT G-Codes **NOT FOR INPATIENT CLASS** Functional Assessment Tool Used: clinical judgment Functional Limitation: Mobility: Walking and moving around Mobility: Walking and Moving Around Current Status [id number]): At least 1 percent but less than 20 percent impaired, limited or restricted Mobility: Walking and Moving Around Goal Status [phone]): At least 1 percent but less than 20 percent impaired, limited or restricted    Cassell Clement, PT, CSCS Pager [phone] Office [phone]  03/27/2016, 1:45 PM

## 2016-03-27 NOTE — Discharge Summary (Signed)
Physician Discharge Summary  Patient ID: [name] MRN: [id number] DOB/AGE: 73-Jul-1942 73 y.o.  Admit date: 03/22/2016 Discharge date: 03/27/2016  Admission Diagnoses:  Discharge Diagnoses:  Principal Problem:   Diabetic infection of right foot ([place]) Active Problems:   Essential hypertension   Chronic systolic CHF (congestive heart failure) (HCC)   DM2 (diabetes mellitus, type 2) (HCC)   Cellulitis and abscess of foot   Puncture wound of foot, right, complicated   Malnutrition of moderate degree   AKI (acute kidney injury) ([place])   Discharged Condition: stable  Hospital Course: 75 year old male with medical history significant of DM. Patietn presented to the ED with ongoing pain, drainage from right foot wound. Patient stepped on a Nail which went through his shoe and into his foot in May of this year. Patient had tetanus shot right afterwards, but developed infection. Patient failed two courses of out patient antibiotics. Patient saw a Podiatrist who performed X ray 2 days prior to admission that was said to be worrisome for osteomyelitis. The drainage and erythema were also worsening and spreading upwards. Patient was admitted and started on IV antibiotics. Orthopedic team was consulted. MRI and vascular work up were advised. Vascular work up revealed likely severe arterial insufficiency at the right lower extremity and mild on the left. MRI was negative for osteomyelitis. With IV antibiotics, the drainage has resolved. Patient will be discharged back on oral doxycycline for one month, as per the Orthopedic surgeon. Patient will follow up with the Orthopedic team in a month for further work up of the peripheral arterial disease.  Consults: orthopedic surgery  Significant Diagnostic Studies: MRI right foot revealed no osteomyelitis of the right forefoot. Severe cellulitis along the plantar aspect of the first MTP joint, and mild tenosynovitis of the flexor hallucis longus were  noted.  Discharge Medication - Please see Med Rec.  Discharge Exam: Blood pressure 170/64, pulse 67, temperature 98.8 F (37.1 C), temperature source Oral, resp. rate 18, height 5\' 10"  (1.778 m), weight 73.029 kg (161 lb), SpO2 98 %.  Disposition: 01-Home or Self Care  Discharge Instructions    Diet - low sodium heart healthy    Complete by:  As directed      Diet Carb Modified    Complete by:  As directed      Discharge instructions    Complete by:  As directed   Follow up with Orthopedic Surgeon (Dr. Sharol Given) in one week. Follow up with PCP in one week. Activity as per Physical Therapy     Increase activity slowly    Complete by:  As directed             Medication List    STOP taking these medications        furosemide 40 MG tablet  Commonly known as:  LASIX     metFORMIN 1000 MG tablet  Commonly known as:  GLUCOPHAGE     potassium chloride SA 20 MEQ tablet  Commonly known as:  K-DUR,KLOR-CON     spironolactone 25 MG tablet  Commonly known as:  ALDACTONE      TAKE these medications        aspirin 325 MG EC tablet  Take 1 tablet (325 mg total) by mouth daily.     carvedilol 6.25 MG tablet  Commonly known as:  COREG  Take 1 tablet (6.25 mg total) by mouth 2 (two) times daily with a meal.     clopidogrel 75 MG tablet  Commonly  known as:  PLAVIX  Take 1 tablet (75 mg total) by mouth daily with breakfast.     docusate sodium 100 MG capsule  Commonly known as:  COLACE  Take 1 capsule (100 mg total) by mouth 2 (two) times daily.     doxycycline 50 MG capsule  Commonly known as:  VIBRAMYCIN  Take 2 capsules (100 mg total) by mouth 2 (two) times daily.     feeding supplement (GLUCERNA SHAKE) Liqd  Take 237 mLs by mouth daily.     glimepiride 1 MG tablet  Commonly known as:  AMARYL  Take 1 mg by mouth 2 (two) times daily.     HYDROcodone-acetaminophen 5-325 MG tablet  Commonly known as:  NORCO/VICODIN  Take 1 tablet by mouth every 6 (six) hours as needed  for moderate pain.     insulin glargine 100 UNIT/ML injection  Commonly known as:  LANTUS  Inject 0.1 mLs (10 Units total) into the skin daily.     losartan 100 MG tablet  Commonly known as:  COZAAR  Take 1 tablet (100 mg total) by mouth daily.     montelukast 10 MG tablet  Commonly known as:  SINGULAIR  Take 10 mg by mouth every morning.     mupirocin cream 2 %  Commonly known as:  BACTROBAN  Apply topically 2 (two) times daily.     NIFEREX Tabs  Take 150 mg by mouth daily.     polyethylene glycol packet  Commonly known as:  MIRALAX / GLYCOLAX  Take 17 g by mouth daily.     simvastatin 40 MG tablet  Commonly known as:  ZOCOR  Take 40 mg by mouth daily.           Follow-up Information    Follow up with DUDA,MARCUS V, MD In 1 week.   Specialty:  Orthopedic Surgery   Contact information:   [place] [address] [phone]       Signed: Bonnell Public 03/27/2016, 12:05 PM

## 2016-03-27 NOTE — Progress Notes (Signed)
Patient ID: [name], male   DOB: [birth year], 75 y.o.   MRN: [id number] The cellulitis and drainage from the patient's right great toe ulcer has completely resolved. There is no fluctuance no signs of abscess. He does have a palpable pulse however vascular studies show an ankle-brachial indices approximate 50% on the right with monophasic flow. Most likely patient will need vascular follow-up for this. I will follow-up in the office in 1 week. Recommend discharge on oral doxycycline for 1 month.

## 2016-03-28 LAB — CULTURE, BLOOD (ROUTINE X 2)
Culture: NO GROWTH
Culture: NO GROWTH

## 2016-03-28 LAB — HEMOGLOBIN A1C
Hgb A1c MFr Bld: 7.1 % — ABNORMAL HIGH (ref 4.8–5.6)
Mean Plasma Glucose: 157 mg/dL

## 2016-03-30 ENCOUNTER — Ambulatory Visit: Payer: Medicare Other | Admitting: Rehabilitation

## 2016-04-03 ENCOUNTER — Ambulatory Visit: Payer: Medicare Other | Admitting: Rehabilitation

## 2016-04-03 ENCOUNTER — Telehealth (HOSPITAL_COMMUNITY): Payer: Self-pay | Admitting: Vascular Surgery

## 2016-04-03 ENCOUNTER — Ambulatory Visit: Payer: Medicare Other | Admitting: Occupational Therapy

## 2016-04-03 NOTE — Telephone Encounter (Signed)
Appointment scheduled for 07/11 at 2pm with Dr. Donnetta Hutching

## 2016-04-03 NOTE — Telephone Encounter (Signed)
-----   Message from [name], RN sent at 04/03/2016 10:22 AM EDT ----- Regarding: urgent referral from Dr. Donna Christen: [phone] rec'd phone call with urgent referral from Dr. Jess Barters office.  This pt. Has wet gangrene right great toe.  Dr. Sharol Given wants the pt. seen tomorrow (no vasc. MD here today)  I will place the ABI's from [facility]. At front desk; Colletta Maryland from Dr. Jess Barters office is sending a referral.  (sending this message to [place] [facility] and [place], as not sure which one will get the referral packet.  Any questions, please call [facility] @ Dr. Jess Barters @ [phone].  thanks

## 2016-04-04 ENCOUNTER — Encounter: Payer: Self-pay | Admitting: Vascular Surgery

## 2016-04-04 ENCOUNTER — Ambulatory Visit (INDEPENDENT_AMBULATORY_CARE_PROVIDER_SITE_OTHER): Payer: Medicare Other | Admitting: Vascular Surgery

## 2016-04-04 ENCOUNTER — Other Ambulatory Visit: Payer: Self-pay

## 2016-04-04 VITALS — BP 113/60 | HR 80 | Temp 97.9°F | Ht 70.0 in | Wt 164.2 lb

## 2016-04-04 DIAGNOSIS — I70261 Atherosclerosis of native arteries of extremities with gangrene, right leg: Secondary | ICD-10-CM

## 2016-04-04 NOTE — Progress Notes (Signed)
Vascular and Vein Specialist of [facility]  Patient name: [name] MRN: [id number] DOB: [birth year] Sex: male  REASON FOR CONSULT: Gangrene right foot  HPI: [name] is a 75 y.o. 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 He saw Dr. Sharol Given yesterday with debridement at his right foot. He has extensive swelling and changes of inflammation in his right foot. He has an open wound probably 3 cm in diameter at the plantar aspect at the base of his great toe. He does have skin slough around this area extending into the medial aspect. No obvious deep tissue infection. Does have extensive history of congestive heart failure and recent history of stroke. Is recovering from a stroke.  Past Medical History  Diagnosis Date  . Diabetes mellitus   . Hyperlipidemia   . Allergy   . Cataract   . Colon polyps 2012    Colonoscopy  . Diverticulosis 2012    Colonoscopy   . Stroke ([place])   . Atrial fibrillation (HCC)     Family History  Problem Relation Age of Onset  . Colon cancer Neg Hx   . Esophageal cancer Neg Hx   . Stomach cancer Neg Hx   . Anesthesia problems Neg Hx   . Hypotension Neg Hx   . Malignant hyperthermia Neg Hx   . Pseudochol deficiency Neg Hx   . Diabetes Mellitus II Mother   . Heart disease Brother     before age 48    SOCIAL HISTORY: Social History   Social History  . Marital Status: Married    Spouse Name: N/A  . Number of Children: 3  . Years of Education: N/A   Occupational History  . Retired    Social History Main Topics  . Smoking status: Current Every Day Smoker -- 0.25 packs/day    Types: Cigarettes  . Smokeless tobacco: Never Used     Comment: tobacco info given 11/07/2011  . Alcohol Use: 0.0 oz/week    0 Standard drinks or equivalent per week     Comment: rarely  . Drug Use: No  . Sexual Activity: Not on file   Other Topics  Concern  . Not on file   Social History Narrative    No Known Allergies  Current Outpatient Prescriptions  Medication Sig Dispense Refill  . aspirin EC 325 MG EC tablet Take 1 tablet (325 mg total) by mouth daily. 30 tablet 3  . carvedilol (COREG) 6.25 MG tablet Take 1 tablet (6.25 mg total) by mouth 2 (two) times daily with a meal. 60 tablet 2  . clopidogrel (PLAVIX) 75 MG tablet Take 1 tablet (75 mg total) by mouth daily with breakfast. 90 tablet 1  . docusate sodium (COLACE) 100 MG capsule Take 1 capsule (100 mg total) by mouth 2 (two) times daily. 10 capsule 0  . doxycycline (VIBRAMYCIN) 50 MG capsule Take 2 capsules (100 mg total) by mouth 2 (two) times daily. 60 capsule 0  . feeding supplement, GLUCERNA SHAKE, (GLUCERNA SHAKE) LIQD Take 237 mLs by mouth daily.    Marland Kitchen glimepiride (AMARYL) 1 MG tablet Take 1 mg by mouth 2 (two) times daily.    Marland Kitchen HYDROcodone-acetaminophen (NORCO/VICODIN) 5-325 MG tablet Take 1 tablet by mouth every 6 (six) hours as needed for moderate pain. 40 tablet 0  . insulin glargine (LANTUS) 100 UNIT/ML injection Inject 0.1 mLs (10 Units total) into the skin daily. 10 mL 11  .  Iron Combinations (NIFEREX) TABS Take 150 mg by mouth daily. 30 tablet 3  . losartan (COZAAR) 100 MG tablet Take 1 tablet (100 mg total) by mouth daily. 30 tablet 0  . montelukast (SINGULAIR) 10 MG tablet Take 10 mg by mouth every morning.     . polyethylene glycol (MIRALAX / GLYCOLAX) packet Take 17 g by mouth daily. 14 each 0  . simvastatin (ZOCOR) 40 MG tablet Take 40 mg by mouth daily.      No current facility-administered medications for this visit.    REVIEW OF SYSTEMS:  [X]  denotes positive finding, [ ]  denotes negative finding Cardiac  Comments:  Chest pain or chest pressure:    Shortness of breath upon exertion:    Short of breath when lying flat:    Irregular heart rhythm:        Vascular    Pain in calf, thigh, or hip brought on by ambulation:    Pain in feet at night  that wakes you up from your sleep:  x   Blood clot in your veins:    Leg swelling:  x       Pulmonary    Oxygen at home:    Productive cough:     Wheezing:         Neurologic    Sudden weakness in arms or legs:  x   Sudden numbness in arms or legs:  x   Sudden onset of difficulty speaking or slurred speech: x   Temporary loss of vision in one eye:     Problems with dizziness:         Gastrointestinal    Blood in stool:     Vomited blood:         Genitourinary    Burning when urinating:     Blood in urine:        Psychiatric    Major depression:         Hematologic    Bleeding problems:    Problems with blood clotting too easily:        Skin    Rashes or ulcers:        Constitutional    Fever or chills: x     PHYSICAL EXAM: Filed Vitals:   04/04/16 1340  BP: 113/60  Pulse: 80  Temp: 97.9 F (36.6 C)  TempSrc: Oral  Height: 5\' 10"  (1.778 m)  Weight: 164 lb 3.2 oz (74.481 kg)  SpO2: 100%    GENERAL: The patient is a well-nourished male, in no acute distress. The vital signs are documented above. CARDIAC: There is a regular rate and rhythm.  VASCULAR: 2+ radial 2+ femoral pulses bilaterally. 2+ left popliteal and absent left distal pulses. Absent right popliteal and distal pulses. Carotid arteries without bruits bilaterally PULMONARY: There is good air exchange bilaterally without wheezing or rales. ABDOMEN: Soft and non-tender with normal pitched bowel sounds.  MUSCULOSKELETAL: There are no major deformities or cyanosis. NEUROLOGIC: No focal weakness or paresthesias are detected. SKIN: Open ulcer on the as directed plantar aspect of his right foot at the base of his great toe. Significant surrounding erythema with no fluctuance. PSYCHIATRIC: The patient has a normal affect.  DATA:  Noninvasive vascular lab studies from 03/24/2016 were reviewed. This reveals ankle arm index of 0.49 on the right and 0.86 on the left. Duplex revealed SFA and probable tibial  occlusive disease on the right  MEDICAL ISSUES: Discussed this at length with patient and his wife present.  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 Clearly does not have adequate flow for healing of his foot. He does have a baseline creatinine of 1.5-1.7. We will pl   limited contrast study tomorrow in the peripheral vascular lab for evaluation of right lower extremity arterial flow. Explain the abnormal likelihood he will require right femoral to popliteal or tibial bypass for limb salvage in conjunction with partial foot amputation with Dr. Sharol Given.   Rosetta Posner, MD FACS Vascular and Vein Specialists of [facility] Tel [phone] Pager [phone]

## 2016-04-05 ENCOUNTER — Other Ambulatory Visit: Payer: Self-pay

## 2016-04-05 ENCOUNTER — Encounter (HOSPITAL_COMMUNITY)
Admission: RE | Disposition: A | Discharge status: Home / Self Care | Payer: Self-pay | Source: Ambulatory Visit | Attending: Vascular Surgery

## 2016-04-05 ENCOUNTER — Ambulatory Visit (HOSPITAL_COMMUNITY)
Admission: RE | Admit: 2016-04-05 | Discharge: 2016-04-05 | Disposition: A | Discharge status: Home / Self Care | Payer: Medicare Other | Source: Ambulatory Visit | Attending: Vascular Surgery | Admitting: Vascular Surgery

## 2016-04-05 DIAGNOSIS — E1152 Type 2 diabetes mellitus with diabetic peripheral angiopathy with gangrene: Principal | ICD-10-CM | POA: Diagnosis present

## 2016-04-05 DIAGNOSIS — Z7984 Long term (current) use of oral hypoglycemic drugs: Secondary | ICD-10-CM

## 2016-04-05 DIAGNOSIS — Z79899 Other long term (current) drug therapy: Secondary | ICD-10-CM

## 2016-04-05 DIAGNOSIS — Z8673 Personal history of transient ischemic attack (TIA), and cerebral infarction without residual deficits: Secondary | ICD-10-CM

## 2016-04-05 DIAGNOSIS — Z7982 Long term (current) use of aspirin: Secondary | ICD-10-CM | POA: Insufficient documentation

## 2016-04-05 DIAGNOSIS — I2510 Atherosclerotic heart disease of native coronary artery without angina pectoris: Secondary | ICD-10-CM | POA: Diagnosis present

## 2016-04-05 DIAGNOSIS — Z8249 Family history of ischemic heart disease and other diseases of the circulatory system: Secondary | ICD-10-CM | POA: Insufficient documentation

## 2016-04-05 DIAGNOSIS — Z7902 Long term (current) use of antithrombotics/antiplatelets: Secondary | ICD-10-CM

## 2016-04-05 DIAGNOSIS — I4891 Unspecified atrial fibrillation: Secondary | ICD-10-CM | POA: Insufficient documentation

## 2016-04-05 DIAGNOSIS — I110 Hypertensive heart disease with heart failure: Secondary | ICD-10-CM | POA: Diagnosis present

## 2016-04-05 DIAGNOSIS — I70261 Atherosclerosis of native arteries of extremities with gangrene, right leg: Secondary | ICD-10-CM

## 2016-04-05 DIAGNOSIS — E785 Hyperlipidemia, unspecified: Secondary | ICD-10-CM | POA: Diagnosis present

## 2016-04-05 DIAGNOSIS — Z794 Long term (current) use of insulin: Secondary | ICD-10-CM | POA: Insufficient documentation

## 2016-04-05 DIAGNOSIS — L97519 Non-pressure chronic ulcer of other part of right foot with unspecified severity: Secondary | ICD-10-CM | POA: Insufficient documentation

## 2016-04-05 DIAGNOSIS — I509 Heart failure, unspecified: Secondary | ICD-10-CM | POA: Diagnosis present

## 2016-04-05 DIAGNOSIS — F17210 Nicotine dependence, cigarettes, uncomplicated: Secondary | ICD-10-CM

## 2016-04-05 DIAGNOSIS — E1140 Type 2 diabetes mellitus with diabetic neuropathy, unspecified: Secondary | ICD-10-CM | POA: Diagnosis present

## 2016-04-05 DIAGNOSIS — E1151 Type 2 diabetes mellitus with diabetic peripheral angiopathy without gangrene: Secondary | ICD-10-CM | POA: Insufficient documentation

## 2016-04-05 DIAGNOSIS — E11621 Type 2 diabetes mellitus with foot ulcer: Secondary | ICD-10-CM | POA: Insufficient documentation

## 2016-04-05 DIAGNOSIS — D62 Acute posthemorrhagic anemia: Secondary | ICD-10-CM | POA: Diagnosis not present

## 2016-04-05 HISTORY — PX: PERIPHERAL VASCULAR CATHETERIZATION: SHX172C

## 2016-04-05 LAB — POCT I-STAT, CHEM 8
BUN: 26 mg/dL — ABNORMAL HIGH (ref 6–20)
Calcium, Ion: 1.24 mmol/L — ABNORMAL HIGH (ref 1.12–1.23)
Chloride: 107 mmol/L (ref 101–111)
Creatinine, Ser: 1.8 mg/dL — ABNORMAL HIGH (ref 0.61–1.24)
Glucose, Bld: 262 mg/dL — ABNORMAL HIGH (ref 65–99)
HCT: 29 % — ABNORMAL LOW (ref 39.0–52.0)
Hemoglobin: 9.9 g/dL — ABNORMAL LOW (ref 13.0–17.0)
Potassium: 5.1 mmol/L (ref 3.5–5.1)
Sodium: 141 mmol/L (ref 135–145)
TCO2: 24 mmol/L (ref 0–100)

## 2016-04-05 LAB — GLUCOSE, CAPILLARY: Glucose-Capillary: 186 mg/dL — ABNORMAL HIGH (ref 65–99)

## 2016-04-05 SURGERY — LOWER EXTREMITY ANGIOGRAPHY
Laterality: Right

## 2016-04-05 MED ORDER — HYDRALAZINE HCL 20 MG/ML IJ SOLN
INTRAMUSCULAR | Status: DC | PRN
  Administered 2016-04-05: 10 mg via INTRAVENOUS

## 2016-04-05 MED ORDER — SODIUM CHLORIDE 0.9 % IV SOLN: 1.0000 mL/kg/h | INTRAVENOUS | Status: DC

## 2016-04-05 MED ORDER — OXYCODONE-ACETAMINOPHEN 5-325 MG PO TABS
ORAL_TABLET | ORAL | Status: AC
  Filled 2016-04-05: qty 2

## 2016-04-05 MED ORDER — IODIXANOL 320 MG/ML IV SOLN
INTRAVENOUS | Status: DC | PRN
  Administered 2016-04-05: 55 mL via INTRAVENOUS

## 2016-04-05 MED ORDER — OXYCODONE-ACETAMINOPHEN 5-325 MG PO TABS
1.0000 | ORAL_TABLET | ORAL | Status: DC | PRN
  Administered 2016-04-05: 2 via ORAL

## 2016-04-05 MED ORDER — LIDOCAINE HCL (PF) 1 % IJ SOLN
INTRAMUSCULAR | Status: AC
  Filled 2016-04-05: qty 30

## 2016-04-05 MED ORDER — LIDOCAINE HCL (PF) 1 % IJ SOLN
INTRAMUSCULAR | Status: DC | PRN
  Administered 2016-04-05: 21 mL

## 2016-04-05 MED ORDER — HEPARIN (PORCINE) IN NACL 2-0.9 UNIT/ML-% IJ SOLN
INTRAMUSCULAR | Status: DC | PRN
  Administered 2016-04-05: 1000 mL

## 2016-04-05 MED ORDER — SODIUM CHLORIDE 0.9 % IV SOLN
INTRAVENOUS | Status: DC
  Administered 2016-04-05: 13:00:00 via INTRAVENOUS

## 2016-04-05 MED ORDER — HEPARIN (PORCINE) IN NACL 2-0.9 UNIT/ML-% IJ SOLN
INTRAMUSCULAR | Status: AC
  Filled 2016-04-05: qty 1000

## 2016-04-05 MED ORDER — HYDRALAZINE HCL 20 MG/ML IJ SOLN
INTRAMUSCULAR | Status: AC
  Filled 2016-04-05: qty 1

## 2016-04-05 SURGICAL SUPPLY — 9 items
CATH ANGIO 5F PIGTAIL 65CM (CATHETERS) ×3 IMPLANT
CATH CROSS OVER TEMPO 5F (CATHETERS) ×3 IMPLANT
CATH STRAIGHT 5FR 65CM (CATHETERS) ×3 IMPLANT
KIT PV (KITS) ×3 IMPLANT
SHEATH PINNACLE 5F 10CM (SHEATH) ×3 IMPLANT
SYR MEDRAD MARK V 150ML (SYRINGE) ×3 IMPLANT
TRANSDUCER W/STOPCOCK (MISCELLANEOUS) ×3 IMPLANT
TRAY PV CATH (CUSTOM PROCEDURE TRAY) ×3 IMPLANT
WIRE HITORQ VERSACORE ST 145CM (WIRE) ×3 IMPLANT

## 2016-04-05 NOTE — Progress Notes (Signed)
Site area: Left groin 5 french arterial sheath was removed  Site Prior to Removal:  Level 0  Pressure Applied For 20 MINUTES    Bedrest Beginning at 1710p  Manual:   Yes.    Patient Status During Pull:  stable  Post Pull Groin Site:  Level 0  Post Pull Instructions Given:  Yes.    Post Pull Pulses Present:  Yes.    Dressing Applied:  Yes.    Comments:  VS remain stable during sheath pull

## 2016-04-05 NOTE — Discharge Instructions (Signed)
NO METFORMIN/GLUCOPHAGE FOR 2 DAYS ° ° °Angiogram, Care After °These instructions give you information about caring for yourself after your procedure. Your doctor may also give you more specific instructions. Call your doctor if you have any problems or questions after your procedure.  °HOME CARE °· Take medicines only as told by your doctor. °· Follow your doctor's instructions about: °¨ Care of the area where the tube was inserted. °¨ Bandage (dressing) changes and removal. °· You may shower 24-48 hours after the procedure or as told by your doctor. °· Do not take baths, swim, or use a hot tub until your doctor approves. °· Every day, check the area where the tube was inserted. Watch for: °¨ Redness, swelling, or pain. °¨ Fluid, blood, or pus. °· Do not apply powder or lotion to the site. °· Do not lift anything that is heavier than 10 lb (4.5 kg) for 5 days or as told by your doctor. °· Ask your doctor when you can: °¨ Return to work or school. °¨ Do physical activities or play sports. °¨ Have sex. °· Do not drive or operate heavy machinery for 24 hours or as told by your doctor. °· Have someone with you for the first 24 hours after the procedure. °· Keep all follow-up visits as told by your doctor. This is important. °GET HELP IF: °· You have a fever.   °· You have chills.   °· You have more bleeding from the area where the tube was inserted. Hold pressure on the area. °· You have redness, swelling, or pain in the area where the tube was inserted. °· You have fluid or pus coming from the area. °GET HELP RIGHT AWAY IF:  °· You have a lot of pain in the area where the tube was inserted. °· The area where the tube was inserted is bleeding, and the bleeding does not stop after 30 minutes of holding steady pressure on the area. °· The area near or just beyond the insertion site becomes pale, cool, tingly, or numb. °  °This information is not intended to replace advice given to you by your health care provider. Make  sure you discuss any questions you have with your health care provider. °  °Document Released: 12/08/2008 Document Revised: 10/02/2014 Document Reviewed: 02/12/2013 °Elsevier Interactive Patient Education ©2016 [employer] ° °

## 2016-04-05 NOTE — H&P (View-Only) (Signed)
Vascular and Vein Specialist of [facility]  Patient name: [name] MRN: [id number] DOB: Jun 28, 1941 Sex: male  REASON FOR CONSULT: Gangrene right foot  HPI: [name] is a 75 y.o. 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 He saw Dr. Sharol Given yesterday with debridement at his right foot. He has extensive swelling and changes of inflammation in his right foot. He has an open wound probably 3 cm in diameter at the plantar aspect at the base of his great toe. He does have skin slough around this area extending into the medial aspect. No obvious deep tissue infection. Does have extensive history of congestive heart failure and recent history of stroke. Is recovering from a stroke.  Past Medical History  Diagnosis Date  . Diabetes mellitus   . Hyperlipidemia   . Allergy   . Cataract   . Colon polyps 2012    Colonoscopy  . Diverticulosis 2012    Colonoscopy   . Stroke ([place])   . Atrial fibrillation (HCC)     Family History  Problem Relation Age of Onset  . Colon cancer Neg Hx   . Esophageal cancer Neg Hx   . Stomach cancer Neg Hx   . Anesthesia problems Neg Hx   . Hypotension Neg Hx   . Malignant hyperthermia Neg Hx   . Pseudochol deficiency Neg Hx   . Diabetes Mellitus II Mother   . Heart disease Brother     before age 37    SOCIAL HISTORY: Social History   Social History  . Marital Status: Married    Spouse Name: N/A  . Number of Children: 3  . Years of Education: N/A   Occupational History  . Retired    Social History Main Topics  . Smoking status: Current Every Day Smoker -- 0.25 packs/day    Types: Cigarettes  . Smokeless tobacco: Never Used     Comment: tobacco info given 11/07/2011  . Alcohol Use: 0.0 oz/week    0 Standard drinks or equivalent per week     Comment: rarely  . Drug Use: No  . Sexual Activity: Not on file   Other Topics  Concern  . Not on file   Social History Narrative    No Known Allergies  Current Outpatient Prescriptions  Medication Sig Dispense Refill  . aspirin EC 325 MG EC tablet Take 1 tablet (325 mg total) by mouth daily. 30 tablet 3  . carvedilol (COREG) 6.25 MG tablet Take 1 tablet (6.25 mg total) by mouth 2 (two) times daily with a meal. 60 tablet 2  . clopidogrel (PLAVIX) 75 MG tablet Take 1 tablet (75 mg total) by mouth daily with breakfast. 90 tablet 1  . docusate sodium (COLACE) 100 MG capsule Take 1 capsule (100 mg total) by mouth 2 (two) times daily. 10 capsule 0  . doxycycline (VIBRAMYCIN) 50 MG capsule Take 2 capsules (100 mg total) by mouth 2 (two) times daily. 60 capsule 0  . feeding supplement, GLUCERNA SHAKE, (GLUCERNA SHAKE) LIQD Take 237 mLs by mouth daily.    Marland Kitchen glimepiride (AMARYL) 1 MG tablet Take 1 mg by mouth 2 (two) times daily.    Marland Kitchen HYDROcodone-acetaminophen (NORCO/VICODIN) 5-325 MG tablet Take 1 tablet by mouth every 6 (six) hours as needed for moderate pain. 40 tablet 0  . insulin glargine (LANTUS) 100 UNIT/ML injection Inject 0.1 mLs (10 Units total) into the skin daily. 10 mL 11  .  Iron Combinations (NIFEREX) TABS Take 150 mg by mouth daily. 30 tablet 3  . losartan (COZAAR) 100 MG tablet Take 1 tablet (100 mg total) by mouth daily. 30 tablet 0  . montelukast (SINGULAIR) 10 MG tablet Take 10 mg by mouth every morning.     . polyethylene glycol (MIRALAX / GLYCOLAX) packet Take 17 g by mouth daily. 14 each 0  . simvastatin (ZOCOR) 40 MG tablet Take 40 mg by mouth daily.      No current facility-administered medications for this visit.    REVIEW OF SYSTEMS:  [X]  denotes positive finding, [ ]  denotes negative finding Cardiac  Comments:  Chest pain or chest pressure:    Shortness of breath upon exertion:    Short of breath when lying flat:    Irregular heart rhythm:        Vascular    Pain in calf, thigh, or hip brought on by ambulation:    Pain in feet at night  that wakes you up from your sleep:  x   Blood clot in your veins:    Leg swelling:  x       Pulmonary    Oxygen at home:    Productive cough:     Wheezing:         Neurologic    Sudden weakness in arms or legs:  x   Sudden numbness in arms or legs:  x   Sudden onset of difficulty speaking or slurred speech: x   Temporary loss of vision in one eye:     Problems with dizziness:         Gastrointestinal    Blood in stool:     Vomited blood:         Genitourinary    Burning when urinating:     Blood in urine:        Psychiatric    Major depression:         Hematologic    Bleeding problems:    Problems with blood clotting too easily:        Skin    Rashes or ulcers:        Constitutional    Fever or chills: x     PHYSICAL EXAM: Filed Vitals:   04/04/16 1340  BP: 113/60  Pulse: 80  Temp: 97.9 F (36.6 C)  TempSrc: Oral  Height: 5\' 10"  (1.778 m)  Weight: 164 lb 3.2 oz (74.481 kg)  SpO2: 100%    GENERAL: The patient is a well-nourished male, in no acute distress. The vital signs are documented above. CARDIAC: There is a regular rate and rhythm.  VASCULAR: 2+ radial 2+ femoral pulses bilaterally. 2+ left popliteal and absent left distal pulses. Absent right popliteal and distal pulses. Carotid arteries without bruits bilaterally PULMONARY: There is good air exchange bilaterally without wheezing or rales. ABDOMEN: Soft and non-tender with normal pitched bowel sounds.  MUSCULOSKELETAL: There are no major deformities or cyanosis. NEUROLOGIC: No focal weakness or paresthesias are detected. SKIN: Open ulcer on the as directed plantar aspect of his right foot at the base of his great toe. Significant surrounding erythema with no fluctuance. PSYCHIATRIC: The patient has a normal affect.  DATA:  Noninvasive vascular lab studies from 03/24/2016 were reviewed. This reveals ankle arm index of 0.49 on the right and 0.86 on the left. Duplex revealed SFA and probable tibial  occlusive disease on the right  MEDICAL ISSUES: Discussed this at length with patient and his wife present.  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 Clearly does not have adequate flow for healing of his foot. He does have a baseline creatinine of 1.5-1.7. We will pl   limited contrast study tomorrow in the peripheral vascular lab for evaluation of right lower extremity arterial flow. Explain the abnormal likelihood he will require right femoral to popliteal or tibial bypass for limb salvage in conjunction with partial foot amputation with Dr. Sharol Given.   Rosetta Posner, MD FACS Vascular and Vein Specialists of [facility] Tel [phone] Pager [phone]

## 2016-04-05 NOTE — Progress Notes (Signed)
Pt reports sore under his big great toe that will not heal. Toe is wrapped in gauze and is not visible to me. Pt denies any fevers or history of MRSA

## 2016-04-05 NOTE — Interval H&P Note (Signed)
History and Physical Interval Note:  04/05/2016 3:39 PM  [name]  has presented today for surgery, with the diagnosis of right leg - right foot grangreen  The various methods of treatment have been discussed with the patient and family. After consideration of risks, benefits and other options for treatment, the patient has consented to  Procedure(s): Lower Extremity Angiography (N/A) as a surgical intervention .  The patient's history has been reviewed, patient examined, no change in status, stable for surgery.  I have reviewed the patient's chart and labs.  Questions were answered to the patient's satisfaction.     Curt Jews

## 2016-04-05 NOTE — Op Note (Signed)
    OPERATIVE REPORT  DATE OF SURGERY: 04/05/2016  PATIENT: [name], 75 y.o. 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  PHYSICIAN ASSISTANT: Nurse  ANESTHESIA:  1% lidocaine local  EBL: Minimal ml     BLOOD ADMINISTERED: None  DRAINS: None  SPECIMEN: None  COUNTS CORRECT:  YES  PLAN OF CARE: Holding area stable   PATIENT DISPOSITION:  PACU - hemodynamically stable  PROCEDURE DETAILS: Patient was taken to the perfect cath lab placed supine position where the area both groins were prepped and draped in sterile fashion. Using local anesthesia and SonoSite visualization, the left common femoral artery was entered with an 18-gauge needle a guidewire was passed up the level suprarenal aorta. A 5 French sheath was passed over the guidewire. A pigtail catheter positioned the level of the suprarenal aorta and AP projections undertaken. This revealed widely patent renal arteries bilaterally with widely patent aorta, common iliac and external iliac arteries. Patient had severe disease in the internal iliac artery laterally. The patient did have elevated creatinine and therefore a crossover catheter was exchanged for the pigtail catheter and this was used to direct a guidewire down to the level of the common femoral artery. A crossover catheter was exchanged for a endhole catheter. Right leg runoff was obtained via the endhole catheter. This revealed patency of the common femoral artery. The proximal superficial femoral and profundus femoris arteries were patent. There was irregularity distal to the origin of the deep femoral artery. The superficial artery was diffusely diseased and had several areas of subtotal occlusion also area of total occlusion at above the level of the  adductor canal. The above-knee popliteal region reconstituted and was patent through the knee and the below-knee popliteal artery. There was single vessel runoff via the peroneal artery which did have some irregularity at its origin. The posterior tibial and peroneal arteries were subtotally occluded throughout their course. Patient tolerated procedure without immediate complication was transferred to the holding area in stable condition.  Findings #1 widely patent aortoiliac segments #2 irregularity in the deep femoral artery on the right with some flow-limiting narrowing #3 areas of subtotal occlusion and complete occlusion in the right superficial femoral artery with reconstitution of above-knee popliteal artery #4 single-vessel runoff via the peroneal artery with some irregularity at the origin of the peroneal artery.   Curt Jews, M.D. 04/05/2016 4:31 PM

## 2016-04-06 ENCOUNTER — Encounter (HOSPITAL_COMMUNITY): Payer: Self-pay | Admitting: Vascular Surgery

## 2016-04-06 MED ORDER — SODIUM CHLORIDE 0.9 % IV SOLN: INTRAVENOUS | Status: DC

## 2016-04-06 MED ORDER — DEXTROSE 5 % IV SOLN
1.5000 g | INTRAVENOUS | Status: AC
  Administered 2016-04-07: 1.5 g via INTRAVENOUS

## 2016-04-06 NOTE — Progress Notes (Addendum)
[name] and [name] report that the medication list that I was reading them is not correct, "he does not take Colace or Insulin, he only took Insulin when they gave it too him in the hospital.  [name] reports that CBG's run around 200. [name] stated that when his" CBG is less than 200 that he starts to feel deadly sick."  [name] denies chest pain or shortness of breath, lightheadedness or diaphoreses.  [name] denies any residual effects from stroke. PCP is [name], I faxed a request for last office visit, labs and medication list. [name] stated that patient is to not take Metformin the am of surgery.

## 2016-04-06 NOTE — Progress Notes (Signed)
Anesthesia Chart Review:  Pt is a 75 year old male scheduled for R fem-pop bypass graft on 04/07/2016 with Deitra Mayo, MD.   Pt is a same day work up.   Cardiologist is Mertie Moores, MD.   PMH includes:  Atrial fibrillation, stroke (02/27/16, 12/2013), CHF, DM, hyperlipidemia. Current smoker. BMI 23  Medications include: ASA, carvedilol, plavix, glimepiride, lantus, iron, losartan, simvastatin. Pt to continue plavix and ASA.   Labs will be obtained DOS.   Chest x-ray 02/27/16 reviewed. No evidence of acute disease  EKG 02/27/16: sinus rhythm. LBBB.   Aortogram with R leg arteriogram 04/03/16 #1 widely patent aortoiliac segments #2 irregularity in the deep femoral artery on the right with some flow-limiting narrowing #3 areas of subtotal occlusion and complete occlusion in the right superficial femoral artery with reconstitution of above-knee popliteal artery #4 single-vessel runoff via the peroneal artery with some irregularity at the origin of the peroneal artery.  Echo 02/29/16:  - Left ventricle: The cavity size was normal. Wall thickness was increased in a pattern of mild LVH. There was focal basal hypertrophy. Systolic function was moderately reduced. The estimated ejection fraction was in the range of 35% to 40%. Doppler parameters are consistent with abnormal left ventricular relaxation (grade 1 diastolic dysfunction). - Aortic valve: There was trivial regurgitation. - Mitral valve: There was mild regurgitation. - Left atrium: The atrium was mildly dilated. - Pericardium, extracardiac: A trivial pericardial effusion was identified.  Nuclear stress test 11/12/15:   Nuclear stress EF: 34%.  There is a small defect of mild severity present in the basal inferior location. The defect is non-reversible.There is a small defect of moderate severity present in the apical inferior and apex location. The defect is non-reversible. No ischemia is noted.  This is a high risk study due to  LV dysfunction and mildly increased TID at 1.22.  The left ventricular ejection fraction is moderately decreased (30-44%).  Nondiagnostic EKG due to baseline EKG changes. Moderate - severe LV dysfunction ( which has been known)  No ischemia.  Continue medical therapy   Cardiac cath 05/11/10:  1. Moderate nonobstructive disease (50%) LAD with mild plaque in other vessels 2. Mild LV global systolic function  Reviewed recent stroke history with Dr. Deatra Canter.   If labs acceptable DOS, I anticipate pt can proceed with surgery as scheduled.   Willeen Cass, FNP-BC [facility]/Anesthesiology Phone: [phone] 04/06/2016 2:37 PM

## 2016-04-06 NOTE — Anesthesia Preprocedure Evaluation (Addendum)
Anesthesia Evaluation  Patient identified by MRN, date of birth, ID band Patient awake    Reviewed: Allergy & Precautions, NPO status , Patient's Chart, lab work & pertinent test results  History of Anesthesia Complications Negative for: history of anesthetic complications  Airway Mallampati: I  TM Distance: >3 FB Neck ROM: Limited    Dental  (+) Edentulous Upper, Poor Dentition   Pulmonary pneumonia, unresolved, Current Smoker,    breath sounds clear to auscultation       Cardiovascular hypertension, + CAD, + Peripheral Vascular Disease and +CHF   Rhythm:Regular Rate:Normal   EF 35-40   Neuro/Psych Diabetic neuropathy  Left facial droop   Neuromuscular disease CVA, Residual Symptoms    GI/Hepatic   Endo/Other  diabetes, Poorly Controlled, Type 2  Renal/GU Renal InsufficiencyRenal disease     Musculoskeletal   Abdominal Normal abdominal exam  (+)   Peds  Hematology  (+) anemia ,   Anesthesia Other Findings Organic impotence   Reproductive/Obstetrics                           ECHO 02/29/16 -Left ventricle: The cavity size was normal. Wall thickness was increased in a pattern of mild LVH. There was focal basal hypertrophy. Systolic function was moderately reduced. The estimated ejection fraction was in the range of 35% to 40%. Doppler parameters are consistent with abnormal left ventricular relaxation (grade 1 diastolic dysfunction). - Aortic valve: There was trivial regurgitation. - Mitral valve: There was mild regurgitation. - Left atrium: The atrium was mildly dilated. - Pericardium, extracardiac: A trivial pericardial effusion was identified.  EKG 02/27/16: Sinus rhythm Left bundle branch block Abnormal ekg  BP Readings from Last 3 Encounters:  04/05/16 137/70  04/04/16 113/60  03/27/16 170/64   Lab Results  Component Value Date   WBC 9.3 03/24/2016   HGB 9.9* 04/05/2016   HCT 29.0* 04/05/2016   MCV 91.5 03/24/2016   PLT 241 03/24/2016     Chemistry      Component Value Date/Time   NA 141 04/05/2016 1302   K 5.1 04/05/2016 1302   CL 107 04/05/2016 1302   CO2 23 03/26/2016 0424   BUN 26* 04/05/2016 1302   CREATININE 1.80* 04/05/2016 1302   CREATININE 1.57* 03/20/2016 1102      Component Value Date/Time   CALCIUM 8.3* 03/26/2016 0424   ALKPHOS 62 03/22/2016 2128   AST 15 03/22/2016 2128   ALT 11* 03/22/2016 2128   BILITOT 0.6 03/22/2016 2128      Anesthesia Physical Anesthesia Plan  ASA: III  Anesthesia Plan: General   Post-op Pain Management:    Induction: Intravenous  Airway Management Planned: Oral ETT  Additional Equipment: Arterial line  Intra-op Plan:   Post-operative Plan: Extubation in OR  Informed Consent:   Dental advisory given  Plan Discussed with: Anesthesiologist, CRNA and Surgeon  Anesthesia Plan Comments:         Anesthesia Quick Evaluation

## 2016-04-07 ENCOUNTER — Inpatient Hospital Stay (HOSPITAL_COMMUNITY)
Admission: RE | Admit: 2016-04-07 | Discharge: 2016-04-10 | DRG: 253 | Disposition: A | Discharge status: Home Health | Payer: Medicare Other | Source: Ambulatory Visit | Attending: Vascular Surgery | Admitting: Vascular Surgery

## 2016-04-07 ENCOUNTER — Inpatient Hospital Stay (HOSPITAL_COMMUNITY): Payer: Medicare Other | Admitting: Emergency Medicine

## 2016-04-07 ENCOUNTER — Encounter (HOSPITAL_COMMUNITY): Payer: Self-pay | Admitting: Urology

## 2016-04-07 ENCOUNTER — Encounter (HOSPITAL_COMMUNITY)
Admission: RE | Disposition: A | Discharge status: Home Health | Payer: Self-pay | Source: Ambulatory Visit | Attending: Vascular Surgery

## 2016-04-07 DIAGNOSIS — Z7984 Long term (current) use of oral hypoglycemic drugs: Secondary | ICD-10-CM | POA: Diagnosis not present

## 2016-04-07 DIAGNOSIS — Z79899 Other long term (current) drug therapy: Secondary | ICD-10-CM | POA: Diagnosis not present

## 2016-04-07 DIAGNOSIS — E1140 Type 2 diabetes mellitus with diabetic neuropathy, unspecified: Secondary | ICD-10-CM | POA: Diagnosis present

## 2016-04-07 DIAGNOSIS — M79671 Pain in right foot: Secondary | ICD-10-CM | POA: Diagnosis present

## 2016-04-07 DIAGNOSIS — Z794 Long term (current) use of insulin: Secondary | ICD-10-CM | POA: Diagnosis not present

## 2016-04-07 DIAGNOSIS — E785 Hyperlipidemia, unspecified: Secondary | ICD-10-CM | POA: Diagnosis present

## 2016-04-07 DIAGNOSIS — I4891 Unspecified atrial fibrillation: Secondary | ICD-10-CM | POA: Diagnosis present

## 2016-04-07 DIAGNOSIS — I110 Hypertensive heart disease with heart failure: Secondary | ICD-10-CM | POA: Diagnosis present

## 2016-04-07 DIAGNOSIS — I2510 Atherosclerotic heart disease of native coronary artery without angina pectoris: Secondary | ICD-10-CM | POA: Diagnosis present

## 2016-04-07 DIAGNOSIS — D62 Acute posthemorrhagic anemia: Secondary | ICD-10-CM | POA: Diagnosis not present

## 2016-04-07 DIAGNOSIS — I509 Heart failure, unspecified: Secondary | ICD-10-CM | POA: Diagnosis present

## 2016-04-07 DIAGNOSIS — Z8673 Personal history of transient ischemic attack (TIA), and cerebral infarction without residual deficits: Secondary | ICD-10-CM | POA: Diagnosis not present

## 2016-04-07 DIAGNOSIS — Z7982 Long term (current) use of aspirin: Secondary | ICD-10-CM | POA: Diagnosis not present

## 2016-04-07 DIAGNOSIS — I70261 Atherosclerosis of native arteries of extremities with gangrene, right leg: Secondary | ICD-10-CM | POA: Diagnosis present

## 2016-04-07 DIAGNOSIS — Z7902 Long term (current) use of antithrombotics/antiplatelets: Secondary | ICD-10-CM | POA: Diagnosis not present

## 2016-04-07 DIAGNOSIS — E1152 Type 2 diabetes mellitus with diabetic peripheral angiopathy with gangrene: Secondary | ICD-10-CM | POA: Diagnosis present

## 2016-04-07 DIAGNOSIS — F17210 Nicotine dependence, cigarettes, uncomplicated: Secondary | ICD-10-CM | POA: Diagnosis present

## 2016-04-07 HISTORY — PX: FEMORAL-POPLITEAL BYPASS GRAFT: SHX937

## 2016-04-07 HISTORY — PX: VEIN HARVEST: SHX6363

## 2016-04-07 HISTORY — DX: Pneumonia, unspecified organism: J18.9

## 2016-04-07 HISTORY — DX: Essential (primary) hypertension: I10

## 2016-04-07 HISTORY — DX: Left bundle-branch block, unspecified: I44.7

## 2016-04-07 HISTORY — DX: Chronic kidney disease, unspecified: N18.9

## 2016-04-07 LAB — TYPE AND SCREEN
ABO/RH(D): A POS
Antibody Screen: NEGATIVE

## 2016-04-07 LAB — ABO/RH: ABO/RH(D): A POS

## 2016-04-07 LAB — GLUCOSE, CAPILLARY
Glucose-Capillary: 347 mg/dL — ABNORMAL HIGH (ref 65–99)
Glucose-Capillary: 192 mg/dL — ABNORMAL HIGH (ref 65–99)
Glucose-Capillary: 134 mg/dL — ABNORMAL HIGH (ref 65–99)
Glucose-Capillary: 101 mg/dL — ABNORMAL HIGH (ref 65–99)
Glucose-Capillary: 108 mg/dL — ABNORMAL HIGH (ref 65–99)
Glucose-Capillary: 97 mg/dL (ref 65–99)
Glucose-Capillary: 162 mg/dL — ABNORMAL HIGH (ref 65–99)

## 2016-04-07 LAB — CBC
HCT: 23.9 % — ABNORMAL LOW (ref 39.0–52.0)
Hemoglobin: 7.8 g/dL — ABNORMAL LOW (ref 13.0–17.0)
MCH: 29.5 pg (ref 26.0–34.0)
MCHC: 32.6 g/dL (ref 30.0–36.0)
MCV: 90.5 fL (ref 78.0–100.0)
Platelets: 490 10*3/uL — ABNORMAL HIGH (ref 150–400)
RBC: 2.64 MIL/uL — ABNORMAL LOW (ref 4.22–5.81)
RDW: 14 % (ref 11.5–15.5)
WBC: 11.7 10*3/uL — ABNORMAL HIGH (ref 4.0–10.5)

## 2016-04-07 LAB — URINALYSIS, ROUTINE W REFLEX MICROSCOPIC
Bilirubin Urine: NEGATIVE
Glucose, UA: >1000 mg/dL — AB
Hgb urine dipstick: NEGATIVE
Ketones, ur: NEGATIVE mg/dL
Leukocytes, UA: NEGATIVE
Nitrite: NEGATIVE
Protein, ur: 30 mg/dL — AB
Specific Gravity, Urine: 1.024 (ref 1.005–1.030)
pH: 5.5 (ref 5.0–8.0)

## 2016-04-07 LAB — POCT I-STAT 4, (NA,K, GLUC, HGB,HCT)
Glucose, Bld: 327 mg/dL — ABNORMAL HIGH (ref 65–99)
HCT: 31 % — ABNORMAL LOW (ref 39.0–52.0)
Hemoglobin: 10.5 g/dL — ABNORMAL LOW (ref 13.0–17.0)
Potassium: 4.6 mmol/L (ref 3.5–5.1)
Sodium: 140 mmol/L (ref 135–145)

## 2016-04-07 LAB — SURGICAL PCR SCREEN
MRSA, PCR: NEGATIVE
Staphylococcus aureus: NEGATIVE

## 2016-04-07 LAB — CREATININE, SERUM
Creatinine, Ser: 1.44 mg/dL — ABNORMAL HIGH (ref 0.61–1.24)
GFR calc Af Amer: 55 mL/min — ABNORMAL LOW (ref >60)
GFR calc non Af Amer: 47 mL/min — ABNORMAL LOW (ref >60)

## 2016-04-07 LAB — PROTIME-INR
INR: 1.09 (ref 0.00–1.49)
Prothrombin Time: 14.3 seconds (ref 11.6–15.2)

## 2016-04-07 LAB — URINE MICROSCOPIC-ADD ON

## 2016-04-07 LAB — APTT: aPTT: 38 seconds — ABNORMAL HIGH (ref 24–37)

## 2016-04-07 SURGERY — BYPASS GRAFT FEMORAL-POPLITEAL ARTERY
Anesthesia: General | Site: Leg Upper | Laterality: Right

## 2016-04-07 MED ORDER — ASPIRIN EC 325 MG PO TBEC
325.0000 mg | DELAYED_RELEASE_TABLET | Freq: Every day | ORAL | Status: DC
  Administered 2016-04-07 – 2016-04-10 (×4): 325 mg via ORAL
  Filled 2016-04-07 (×4): qty 1

## 2016-04-07 MED ORDER — HEPARIN (PORCINE) IN NACL 100-0.45 UNIT/ML-% IJ SOLN
400.0000 [IU]/h | INTRAMUSCULAR | Status: DC
  Filled 2016-04-07: qty 250

## 2016-04-07 MED ORDER — SODIUM CHLORIDE 0.9 % IV SOLN
INTRAVENOUS | Status: DC
  Administered 2016-04-07: 13:00:00 via INTRAVENOUS

## 2016-04-07 MED ORDER — DEXTROSE 5 % IV SOLN
INTRAVENOUS | Status: AC
  Filled 2016-04-07: qty 1.5

## 2016-04-07 MED ORDER — OXYCODONE-ACETAMINOPHEN 5-325 MG PO TABS
1.0000 | ORAL_TABLET | ORAL | Status: DC | PRN
  Administered 2016-04-07 – 2016-04-10 (×13): 2 via ORAL
  Administered 2016-04-10: 1 via ORAL
  Filled 2016-04-07 (×14): qty 2

## 2016-04-07 MED ORDER — MUPIROCIN 2 % EX OINT
TOPICAL_OINTMENT | CUTANEOUS | Status: AC
  Filled 2016-04-07: qty 22

## 2016-04-07 MED ORDER — MAGNESIUM SULFATE 2 GM/50ML IV SOLN
2.0000 g | Freq: Every day | INTRAVENOUS | Status: DC | PRN
  Filled 2016-04-07: qty 50

## 2016-04-07 MED ORDER — FENTANYL CITRATE (PF) 100 MCG/2ML IJ SOLN: 25.0000 ug | INTRAMUSCULAR | Status: DC | PRN

## 2016-04-07 MED ORDER — ROCURONIUM BROMIDE 50 MG/5ML IV SOLN
INTRAVENOUS | Status: AC
  Filled 2016-04-07: qty 2

## 2016-04-07 MED ORDER — OXYCODONE HCL 5 MG/5ML PO SOLN: 5.0000 mg | Freq: Once | ORAL | Status: DC | PRN

## 2016-04-07 MED ORDER — PIPERACILLIN-TAZOBACTAM 3.375 G IVPB
3.3750 g | Freq: Three times a day (TID) | INTRAVENOUS | Status: DC
  Administered 2016-04-07 – 2016-04-10 (×9): 3.375 g via INTRAVENOUS
  Filled 2016-04-07 (×14): qty 50

## 2016-04-07 MED ORDER — MORPHINE SULFATE (PF) 2 MG/ML IV SOLN
2.0000 mg | INTRAVENOUS | Status: DC | PRN
  Administered 2016-04-07 (×2): 2 mg via INTRAVENOUS
  Filled 2016-04-07 (×2): qty 1

## 2016-04-07 MED ORDER — PANTOPRAZOLE SODIUM 40 MG PO TBEC
40.0000 mg | DELAYED_RELEASE_TABLET | Freq: Every day | ORAL | Status: DC
  Administered 2016-04-07 – 2016-04-10 (×4): 40 mg via ORAL
  Filled 2016-04-07 (×4): qty 1

## 2016-04-07 MED ORDER — 0.9 % SODIUM CHLORIDE (POUR BTL) OPTIME
TOPICAL | Status: DC | PRN
  Administered 2016-04-07: 3000 mL

## 2016-04-07 MED ORDER — ONDANSETRON HCL 4 MG/2ML IJ SOLN: 4.0000 mg | Freq: Four times a day (QID) | INTRAMUSCULAR | Status: DC | PRN

## 2016-04-07 MED ORDER — ONDANSETRON HCL 4 MG/2ML IJ SOLN: 4.0000 mg | Freq: Once | INTRAMUSCULAR | Status: DC | PRN

## 2016-04-07 MED ORDER — LIDOCAINE 2% (20 MG/ML) 5 ML SYRINGE
INTRAMUSCULAR | Status: DC | PRN
  Administered 2016-04-07: 40 mg via INTRAVENOUS

## 2016-04-07 MED ORDER — ONDANSETRON HCL 4 MG/2ML IJ SOLN
INTRAMUSCULAR | Status: AC
  Filled 2016-04-07: qty 2

## 2016-04-07 MED ORDER — SODIUM CHLORIDE 0.9 % IV SOLN
INTRAVENOUS | Status: DC
  Administered 2016-04-07: 5.7 [IU]/h via INTRAVENOUS
  Filled 2016-04-07: qty 2.5

## 2016-04-07 MED ORDER — PHENYLEPHRINE 40 MCG/ML (10ML) SYRINGE FOR IV PUSH (FOR BLOOD PRESSURE SUPPORT)
PREFILLED_SYRINGE | INTRAVENOUS | Status: AC
  Filled 2016-04-07: qty 20

## 2016-04-07 MED ORDER — SODIUM CHLORIDE 0.9 % IV SOLN
INTRAVENOUS | Status: DC | PRN
  Administered 2016-04-07: 500 mL

## 2016-04-07 MED ORDER — FENTANYL CITRATE (PF) 250 MCG/5ML IJ SOLN
INTRAMUSCULAR | Status: DC | PRN
  Administered 2016-04-07 (×2): 50 ug via INTRAVENOUS
  Administered 2016-04-07: 100 ug via INTRAVENOUS
  Administered 2016-04-07: 50 ug via INTRAVENOUS

## 2016-04-07 MED ORDER — ROCURONIUM BROMIDE 100 MG/10ML IV SOLN
INTRAVENOUS | Status: DC | PRN
  Administered 2016-04-07: 20 mg via INTRAVENOUS
  Administered 2016-04-07: 50 mg via INTRAVENOUS
  Administered 2016-04-07: 20 mg via INTRAVENOUS
  Administered 2016-04-07: 10 mg via INTRAVENOUS

## 2016-04-07 MED ORDER — CARVEDILOL 6.25 MG PO TABS
6.2500 mg | ORAL_TABLET | Freq: Two times a day (BID) | ORAL | Status: DC
  Administered 2016-04-08 – 2016-04-10 (×5): 6.25 mg via ORAL
  Filled 2016-04-07 (×6): qty 1

## 2016-04-07 MED ORDER — GUAIFENESIN-DM 100-10 MG/5ML PO SYRP: 15.0000 mL | ORAL_SOLUTION | ORAL | Status: DC | PRN

## 2016-04-07 MED ORDER — ENOXAPARIN SODIUM 40 MG/0.4ML ~~LOC~~ SOLN: 40.0000 mg | SUBCUTANEOUS | Status: DC

## 2016-04-07 MED ORDER — INSULIN ASPART 100 UNIT/ML ~~LOC~~ SOLN
0.0000 [IU] | Freq: Three times a day (TID) | SUBCUTANEOUS | Status: DC
  Administered 2016-04-07 – 2016-04-08 (×3): 2 [IU] via SUBCUTANEOUS
  Administered 2016-04-08: 1 [IU] via SUBCUTANEOUS
  Administered 2016-04-09 (×2): 5 [IU] via SUBCUTANEOUS
  Administered 2016-04-09: 2 [IU] via SUBCUTANEOUS
  Administered 2016-04-10: 3 [IU] via SUBCUTANEOUS
  Administered 2016-04-10: 2 [IU] via SUBCUTANEOUS

## 2016-04-07 MED ORDER — CHLORHEXIDINE GLUCONATE CLOTH 2 % EX PADS: 6.0000 | MEDICATED_PAD | Freq: Once | CUTANEOUS | Status: DC

## 2016-04-07 MED ORDER — PROTAMINE SULFATE 10 MG/ML IV SOLN
INTRAVENOUS | Status: AC
  Filled 2016-04-07: qty 5

## 2016-04-07 MED ORDER — LACTATED RINGERS IV SOLN
INTRAVENOUS | Status: DC | PRN
  Administered 2016-04-07: 07:00:00 via INTRAVENOUS

## 2016-04-07 MED ORDER — ACETAMINOPHEN 325 MG PO TABS
325.0000 mg | ORAL_TABLET | ORAL | Status: DC | PRN
  Administered 2016-04-09: 650 mg via ORAL
  Filled 2016-04-07: qty 2

## 2016-04-07 MED ORDER — SIMVASTATIN 40 MG PO TABS
40.0000 mg | ORAL_TABLET | Freq: Every day | ORAL | Status: DC
  Administered 2016-04-07 – 2016-04-10 (×4): 40 mg via ORAL
  Filled 2016-04-07 (×4): qty 1

## 2016-04-07 MED ORDER — SUGAMMADEX SODIUM 200 MG/2ML IV SOLN
INTRAVENOUS | Status: DC | PRN
  Administered 2016-04-07: 200 mg via INTRAVENOUS

## 2016-04-07 MED ORDER — MIDAZOLAM HCL 5 MG/5ML IJ SOLN
INTRAMUSCULAR | Status: DC | PRN
  Administered 2016-04-07 (×2): 1 mg via INTRAVENOUS

## 2016-04-07 MED ORDER — HYDRALAZINE HCL 20 MG/ML IJ SOLN
INTRAMUSCULAR | Status: AC
  Administered 2016-04-07: 5 mg via INTRAVENOUS
  Filled 2016-04-07: qty 1

## 2016-04-07 MED ORDER — CLOPIDOGREL BISULFATE 75 MG PO TABS
75.0000 mg | ORAL_TABLET | Freq: Every day | ORAL | Status: DC
  Administered 2016-04-08 – 2016-04-10 (×3): 75 mg via ORAL
  Filled 2016-04-07 (×3): qty 1

## 2016-04-07 MED ORDER — SODIUM CHLORIDE 0.9 % IV SOLN: 500.0000 mL | Freq: Once | INTRAVENOUS | Status: DC | PRN

## 2016-04-07 MED ORDER — PHENYLEPHRINE HCL 10 MG/ML IJ SOLN
INTRAMUSCULAR | Status: DC | PRN
  Administered 2016-04-07: 120 ug via INTRAVENOUS
  Administered 2016-04-07: 80 ug via INTRAVENOUS

## 2016-04-07 MED ORDER — PROTAMINE SULFATE 10 MG/ML IV SOLN
INTRAVENOUS | Status: DC | PRN
  Administered 2016-04-07: 30 mg via INTRAVENOUS

## 2016-04-07 MED ORDER — PHENOL 1.4 % MT LIQD: 1.0000 | OROMUCOSAL | Status: DC | PRN

## 2016-04-07 MED ORDER — PAPAVERINE HCL 30 MG/ML IJ SOLN
INTRAMUSCULAR | Status: DC | PRN
  Administered 2016-04-07: 60 mg via INTRAVENOUS

## 2016-04-07 MED ORDER — ROCURONIUM BROMIDE 50 MG/5ML IV SOLN
INTRAVENOUS | Status: AC
  Filled 2016-04-07: qty 1

## 2016-04-07 MED ORDER — THROMBIN 20000 UNITS EX SOLR
CUTANEOUS | Status: AC
  Filled 2016-04-07: qty 20000

## 2016-04-07 MED ORDER — METOPROLOL TARTRATE 5 MG/5ML IV SOLN: 2.0000 mg | INTRAVENOUS | Status: DC | PRN

## 2016-04-07 MED ORDER — PHENYLEPHRINE HCL 10 MG/ML IJ SOLN
10.0000 mg | INTRAVENOUS | Status: DC | PRN
  Administered 2016-04-07: 40 ug/min via INTRAVENOUS

## 2016-04-07 MED ORDER — LABETALOL HCL 5 MG/ML IV SOLN
10.0000 mg | INTRAVENOUS | Status: AC | PRN
  Administered 2016-04-07 (×4): 10 mg via INTRAVENOUS

## 2016-04-07 MED ORDER — PROPOFOL 10 MG/ML IV BOLUS
INTRAVENOUS | Status: DC | PRN
  Administered 2016-04-07: 130 mg via INTRAVENOUS

## 2016-04-07 MED ORDER — PROPOFOL 10 MG/ML IV BOLUS
INTRAVENOUS | Status: AC
  Filled 2016-04-07: qty 40

## 2016-04-07 MED ORDER — FENTANYL CITRATE (PF) 250 MCG/5ML IJ SOLN
INTRAMUSCULAR | Status: AC
  Filled 2016-04-07: qty 10

## 2016-04-07 MED ORDER — HEPARIN SODIUM (PORCINE) 1000 UNIT/ML IJ SOLN
INTRAMUSCULAR | Status: AC
  Filled 2016-04-07: qty 1

## 2016-04-07 MED ORDER — SUGAMMADEX SODIUM 200 MG/2ML IV SOLN
INTRAVENOUS | Status: AC
  Filled 2016-04-07: qty 2

## 2016-04-07 MED ORDER — HYDRALAZINE HCL 20 MG/ML IJ SOLN
5.0000 mg | INTRAMUSCULAR | Status: AC | PRN
  Administered 2016-04-07 (×2): 5 mg via INTRAVENOUS

## 2016-04-07 MED ORDER — LABETALOL HCL 5 MG/ML IV SOLN
INTRAVENOUS | Status: AC
  Filled 2016-04-07: qty 4

## 2016-04-07 MED ORDER — POTASSIUM CHLORIDE CRYS ER 20 MEQ PO TBCR: 20.0000 meq | EXTENDED_RELEASE_TABLET | Freq: Every day | ORAL | Status: DC | PRN

## 2016-04-07 MED ORDER — OXYCODONE HCL 5 MG PO TABS: 5.0000 mg | ORAL_TABLET | Freq: Once | ORAL | Status: DC | PRN

## 2016-04-07 MED ORDER — MIDAZOLAM HCL 2 MG/2ML IJ SOLN
INTRAMUSCULAR | Status: AC
  Filled 2016-04-07: qty 2

## 2016-04-07 MED ORDER — LOSARTAN POTASSIUM 50 MG PO TABS
100.0000 mg | ORAL_TABLET | Freq: Every day | ORAL | Status: DC
  Administered 2016-04-08 – 2016-04-10 (×3): 100 mg via ORAL
  Filled 2016-04-07 (×4): qty 2

## 2016-04-07 MED ORDER — HEPARIN SODIUM (PORCINE) 1000 UNIT/ML IJ SOLN
INTRAMUSCULAR | Status: DC | PRN
  Administered 2016-04-07: 7000 [IU] via INTRAVENOUS

## 2016-04-07 MED ORDER — INSULIN GLARGINE 100 UNIT/ML ~~LOC~~ SOLN
10.0000 [IU] | Freq: Every day | SUBCUTANEOUS | Status: DC
  Administered 2016-04-07 – 2016-04-10 (×4): 10 [IU] via SUBCUTANEOUS
  Filled 2016-04-07 (×4): qty 0.1

## 2016-04-07 MED ORDER — ACETAMINOPHEN 325 MG RE SUPP: 325.0000 mg | RECTAL | Status: DC | PRN

## 2016-04-07 MED ORDER — LACTATED RINGERS IV SOLN
INTRAVENOUS | Status: DC | PRN
  Administered 2016-04-07 (×2): via INTRAVENOUS

## 2016-04-07 MED ORDER — ETOMIDATE 2 MG/ML IV SOLN
INTRAVENOUS | Status: AC
  Filled 2016-04-07: qty 10

## 2016-04-07 MED ORDER — PAPAVERINE HCL 30 MG/ML IJ SOLN
INTRAMUSCULAR | Status: AC
  Filled 2016-04-07: qty 2

## 2016-04-07 MED ORDER — IOPAMIDOL (ISOVUE-300) INJECTION 61%
INTRAVENOUS | Status: AC
  Filled 2016-04-07: qty 50

## 2016-04-07 MED ORDER — ALBUMIN HUMAN 5 % IV SOLN
INTRAVENOUS | Status: DC | PRN
  Administered 2016-04-07 (×2): via INTRAVENOUS

## 2016-04-07 MED ORDER — MUPIROCIN 2 % EX OINT
TOPICAL_OINTMENT | Freq: Once | CUTANEOUS | Status: AC
  Administered 2016-04-07: 06:00:00 via NASAL
  Filled 2016-04-07: qty 22

## 2016-04-07 MED ORDER — INSULIN ASPART 100 UNIT/ML ~~LOC~~ SOLN
5.0000 [IU] | Freq: Once | SUBCUTANEOUS | Status: AC
  Administered 2016-04-07: 5 [IU] via SUBCUTANEOUS
  Filled 2016-04-07: qty 0.05

## 2016-04-07 MED ORDER — DOCUSATE SODIUM 100 MG PO CAPS
100.0000 mg | ORAL_CAPSULE | Freq: Two times a day (BID) | ORAL | Status: DC
  Administered 2016-04-07 – 2016-04-10 (×7): 100 mg via ORAL
  Filled 2016-04-07 (×7): qty 1

## 2016-04-07 MED ORDER — ALUM & MAG HYDROXIDE-SIMETH 200-200-20 MG/5ML PO SUSP
15.0000 mL | ORAL | Status: DC | PRN
Start: 2016-04-07 — End: 2016-04-10

## 2016-04-07 MED ORDER — LIDOCAINE 2% (20 MG/ML) 5 ML SYRINGE
INTRAMUSCULAR | Status: AC
  Filled 2016-04-07: qty 5

## 2016-04-07 SURGICAL SUPPLY — 60 items
BANDAGE ELASTIC 4 VELCRO ST LF (GAUZE/BANDAGES/DRESSINGS) IMPLANT
BANDAGE ESMARK 6X9 LF (GAUZE/BANDAGES/DRESSINGS) ×1 IMPLANT
BNDG ESMARK 6X9 LF (GAUZE/BANDAGES/DRESSINGS) ×2
BNDG GAUZE ELAST 4 BULKY (GAUZE/BANDAGES/DRESSINGS) ×2 IMPLANT
CANISTER SUCTION 2500CC (MISCELLANEOUS) ×2 IMPLANT
CANNULA VESSEL 3MM 2 BLNT TIP (CANNULA) ×6 IMPLANT
CLIP TI MEDIUM 24 (CLIP) ×2 IMPLANT
CLIP TI WIDE RED SMALL 24 (CLIP) ×4 IMPLANT
CUFF TOURNIQUET SINGLE 24IN (TOURNIQUET CUFF) ×2 IMPLANT
CUFF TOURNIQUET SINGLE 34IN LL (TOURNIQUET CUFF) IMPLANT
CUFF TOURNIQUET SINGLE 44IN (TOURNIQUET CUFF) IMPLANT
DRAIN CHANNEL 15F RND FF W/TCR (WOUND CARE) IMPLANT
DRAPE PROXIMA HALF (DRAPES) IMPLANT
DRAPE X-RAY CASS 24X20 (DRAPES) IMPLANT
DRSG COVADERM 4X10 (GAUZE/BANDAGES/DRESSINGS) IMPLANT
DRSG COVADERM 4X8 (GAUZE/BANDAGES/DRESSINGS) IMPLANT
ELECT REM PT RETURN 9FT ADLT (ELECTROSURGICAL) ×2
ELECTRODE REM PT RTRN 9FT ADLT (ELECTROSURGICAL) ×1 IMPLANT
EVACUATOR SILICONE 100CC (DRAIN) IMPLANT
GAUZE SPONGE 4X4 16PLY XRAY LF (GAUZE/BANDAGES/DRESSINGS) ×2 IMPLANT
GLOVE BIO SURGEON STRL SZ7.5 (GLOVE) ×2 IMPLANT
GLOVE BIOGEL M 6.5 STRL (GLOVE) ×4 IMPLANT
GLOVE BIOGEL PI IND STRL 6.5 (GLOVE) ×4 IMPLANT
GLOVE BIOGEL PI IND STRL 8 (GLOVE) ×1 IMPLANT
GLOVE BIOGEL PI INDICATOR 6.5 (GLOVE) ×4
GLOVE BIOGEL PI INDICATOR 8 (GLOVE) ×1
GLOVE ECLIPSE 6.5 STRL STRAW (GLOVE) ×2 IMPLANT
GOWN STRL REUS W/ TWL LRG LVL3 (GOWN DISPOSABLE) ×3 IMPLANT
GOWN STRL REUS W/TWL LRG LVL3 (GOWN DISPOSABLE) ×6
KIT BASIN OR (CUSTOM PROCEDURE TRAY) ×2 IMPLANT
KIT ROOM TURNOVER OR (KITS) ×2 IMPLANT
LIQUID BAND (GAUZE/BANDAGES/DRESSINGS) ×12 IMPLANT
MARKER GRAFT CORONARY BYPASS (MISCELLANEOUS) ×2 IMPLANT
MARKER SKIN DUAL TIP RULER LAB (MISCELLANEOUS) ×2 IMPLANT
NS IRRIG 1000ML POUR BTL (IV SOLUTION) ×4 IMPLANT
PACK PERIPHERAL VASCULAR (CUSTOM PROCEDURE TRAY) ×2 IMPLANT
PAD ARMBOARD 7.5X6 YLW CONV (MISCELLANEOUS) ×4 IMPLANT
PADDING CAST COTTON 6X4 STRL (CAST SUPPLIES) IMPLANT
SET COLLECT BLD 21X3/4 12 (NEEDLE) IMPLANT
SPONGE GAUZE 4X4 12PLY STER LF (GAUZE/BANDAGES/DRESSINGS) ×2 IMPLANT
SPONGE SURGIFOAM ABS GEL 100 (HEMOSTASIS) IMPLANT
STAPLER VISISTAT (STAPLE) IMPLANT
STOPCOCK 4 WAY LG BORE MALE ST (IV SETS) IMPLANT
SUT ETHILON 3 0 PS 1 (SUTURE) IMPLANT
SUT PROLENE 5 0 C 1 24 (SUTURE) ×4 IMPLANT
SUT PROLENE 6 0 BV (SUTURE) ×4 IMPLANT
SUT PROLENE 7 0 BV 1 (SUTURE) ×4 IMPLANT
SUT SILK 2 0 FS (SUTURE) ×2 IMPLANT
SUT SILK 2 0 SH (SUTURE) ×4 IMPLANT
SUT SILK 3 0 (SUTURE) ×2
SUT SILK 3-0 18XBRD TIE 12 (SUTURE) ×1 IMPLANT
SUT VIC AB 2-0 CTB1 (SUTURE) ×4 IMPLANT
SUT VIC AB 3-0 SH 27 (SUTURE) ×14
SUT VIC AB 3-0 SH 27X BRD (SUTURE) ×7 IMPLANT
SUT VICRYL 4-0 PS2 18IN ABS (SUTURE) ×16 IMPLANT
SYR 20CC LL (SYRINGE) ×2 IMPLANT
TRAY FOLEY W/METER SILVER 16FR (SET/KITS/TRAYS/PACK) ×2 IMPLANT
TUBING EXTENTION W/L.L. (IV SETS) IMPLANT
UNDERPAD 30X30 INCONTINENT (UNDERPADS AND DIAPERS) ×2 IMPLANT
WATER STERILE IRR 1000ML POUR (IV SOLUTION) ×2 IMPLANT

## 2016-04-07 NOTE — H&P (View-Only) (Signed)
Vascular and Vein Specialist of [facility]  Patient name: [name] MRN: [id number] DOB: 04-Apr-1941 Sex: male  REASON FOR CONSULT: Gangrene right foot  HPI: [name] is a 75 y.o. 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 He saw Dr. Sharol Given yesterday with debridement at his right foot. He has extensive swelling and changes of inflammation in his right foot. He has an open wound probably 3 cm in diameter at the plantar aspect at the base of his great toe. He does have skin slough around this area extending into the medial aspect. No obvious deep tissue infection. Does have extensive history of congestive heart failure and recent history of stroke. Is recovering from a stroke.  Past Medical History  Diagnosis Date  . Diabetes mellitus   . Hyperlipidemia   . Allergy   . Cataract   . Colon polyps 2012    Colonoscopy  . Diverticulosis 2012    Colonoscopy   . Stroke ([place])   . Atrial fibrillation (HCC)     Family History  Problem Relation Age of Onset  . Colon cancer Neg Hx   . Esophageal cancer Neg Hx   . Stomach cancer Neg Hx   . Anesthesia problems Neg Hx   . Hypotension Neg Hx   . Malignant hyperthermia Neg Hx   . Pseudochol deficiency Neg Hx   . Diabetes Mellitus II Mother   . Heart disease Brother     before age 67    SOCIAL HISTORY: Social History   Social History  . Marital Status: Married    Spouse Name: N/A  . Number of Children: 3  . Years of Education: N/A   Occupational History  . Retired    Social History Main Topics  . Smoking status: Current Every Day Smoker -- 0.25 packs/day    Types: Cigarettes  . Smokeless tobacco: Never Used     Comment: tobacco info given 11/07/2011  . Alcohol Use: 0.0 oz/week    0 Standard drinks or equivalent per week     Comment: rarely  . Drug Use: No  . Sexual Activity: Not on file   Other Topics  Concern  . Not on file   Social History Narrative    No Known Allergies  Current Outpatient Prescriptions  Medication Sig Dispense Refill  . aspirin EC 325 MG EC tablet Take 1 tablet (325 mg total) by mouth daily. 30 tablet 3  . carvedilol (COREG) 6.25 MG tablet Take 1 tablet (6.25 mg total) by mouth 2 (two) times daily with a meal. 60 tablet 2  . clopidogrel (PLAVIX) 75 MG tablet Take 1 tablet (75 mg total) by mouth daily with breakfast. 90 tablet 1  . docusate sodium (COLACE) 100 MG capsule Take 1 capsule (100 mg total) by mouth 2 (two) times daily. 10 capsule 0  . doxycycline (VIBRAMYCIN) 50 MG capsule Take 2 capsules (100 mg total) by mouth 2 (two) times daily. 60 capsule 0  . feeding supplement, GLUCERNA SHAKE, (GLUCERNA SHAKE) LIQD Take 237 mLs by mouth daily.    Marland Kitchen glimepiride (AMARYL) 1 MG tablet Take 1 mg by mouth 2 (two) times daily.    Marland Kitchen HYDROcodone-acetaminophen (NORCO/VICODIN) 5-325 MG tablet Take 1 tablet by mouth every 6 (six) hours as needed for moderate pain. 40 tablet 0  . insulin glargine (LANTUS) 100 UNIT/ML injection Inject 0.1 mLs (10 Units total) into the skin daily. 10 mL 11  .  Iron Combinations (NIFEREX) TABS Take 150 mg by mouth daily. 30 tablet 3  . losartan (COZAAR) 100 MG tablet Take 1 tablet (100 mg total) by mouth daily. 30 tablet 0  . montelukast (SINGULAIR) 10 MG tablet Take 10 mg by mouth every morning.     . polyethylene glycol (MIRALAX / GLYCOLAX) packet Take 17 g by mouth daily. 14 each 0  . simvastatin (ZOCOR) 40 MG tablet Take 40 mg by mouth daily.      No current facility-administered medications for this visit.    REVIEW OF SYSTEMS:  [X]  denotes positive finding, [ ]  denotes negative finding Cardiac  Comments:  Chest pain or chest pressure:    Shortness of breath upon exertion:    Short of breath when lying flat:    Irregular heart rhythm:        Vascular    Pain in calf, thigh, or hip brought on by ambulation:    Pain in feet at night  that wakes you up from your sleep:  x   Blood clot in your veins:    Leg swelling:  x       Pulmonary    Oxygen at home:    Productive cough:     Wheezing:         Neurologic    Sudden weakness in arms or legs:  x   Sudden numbness in arms or legs:  x   Sudden onset of difficulty speaking or slurred speech: x   Temporary loss of vision in one eye:     Problems with dizziness:         Gastrointestinal    Blood in stool:     Vomited blood:         Genitourinary    Burning when urinating:     Blood in urine:        Psychiatric    Major depression:         Hematologic    Bleeding problems:    Problems with blood clotting too easily:        Skin    Rashes or ulcers:        Constitutional    Fever or chills: x     PHYSICAL EXAM: Filed Vitals:   04/04/16 1340  BP: 113/60  Pulse: 80  Temp: 97.9 F (36.6 C)  TempSrc: Oral  Height: 5\' 10"  (1.778 m)  Weight: 164 lb 3.2 oz (74.481 kg)  SpO2: 100%    GENERAL: The patient is a well-nourished male, in no acute distress. The vital signs are documented above. CARDIAC: There is a regular rate and rhythm.  VASCULAR: 2+ radial 2+ femoral pulses bilaterally. 2+ left popliteal and absent left distal pulses. Absent right popliteal and distal pulses. Carotid arteries without bruits bilaterally PULMONARY: There is good air exchange bilaterally without wheezing or rales. ABDOMEN: Soft and non-tender with normal pitched bowel sounds.  MUSCULOSKELETAL: There are no major deformities or cyanosis. NEUROLOGIC: No focal weakness or paresthesias are detected. SKIN: Open ulcer on the as directed plantar aspect of his right foot at the base of his great toe. Significant surrounding erythema with no fluctuance. PSYCHIATRIC: The patient has a normal affect.  DATA:  Noninvasive vascular lab studies from 03/24/2016 were reviewed. This reveals ankle arm index of 0.49 on the right and 0.86 on the left. Duplex revealed SFA and probable tibial  occlusive disease on the right  MEDICAL ISSUES: Discussed this at length with patient and his wife present.  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 Clearly does not have adequate flow for healing of his foot. He does have a baseline creatinine of 1.5-1.7. We will pl   limited contrast study tomorrow in the peripheral vascular lab for evaluation of right lower extremity arterial flow. Explain the abnormal likelihood he will require right femoral to popliteal or tibial bypass for limb salvage in conjunction with partial foot amputation with Dr. Sharol Given.   Rosetta Posner, MD FACS [facility] Vein Specialists of [facility] Tel [phone] Pager [phone]

## 2016-04-07 NOTE — Transfer of Care (Signed)
Immediate Anesthesia Transfer of Care Note  Patient: [name]  Procedure(s) Performed: Procedure(s): BYPASS GRAFT FEMORAL-POPLITEAL ARTERY-RIGHT (Right) VEIN HARVEST GREATER SAPHENOUS VIEN  (Right)  Patient Location: PACU  Anesthesia Type:General  Level of Consciousness: awake, alert , oriented and patient cooperative  Airway & Oxygen Therapy: Patient Spontanous Breathing and Patient connected to nasal cannula oxygen  Post-op Assessment: Report given to RN and Post -op Vital signs reviewed and stable  Post vital signs: Reviewed and stable  Last Vitals:  Filed Vitals:   04/07/16 0635 04/07/16 1145  BP: 174/68 148/66  Pulse:  72  Temp:  36.6 C  Resp:  13    Last Pain:  Filed Vitals:   04/07/16 1154  PainSc: 8          Complications: No apparent anesthesia complications

## 2016-04-07 NOTE — Interval H&P Note (Signed)
History and Physical Interval Note:  04/07/2016 6:25 AM  [name]  has presented today for surgery, with the diagnosis of Peripheral Vascular Disease with Gangrene right great toe  I70.261  The various methods of treatment have been discussed with the patient and family. After consideration of risks, benefits and other options for treatment, the patient has consented to  Procedure(s): BYPASS GRAFT FEMORAL-POPLITEAL ARTERY-RIGHT (Right) as a surgical intervention .  The patient's history has been reviewed, patient examined, no change in status, stable for surgery.  I have reviewed the patient's chart and labs.  Questions were answered to the patient's satisfaction.     Deitra Mayo

## 2016-04-07 NOTE — Op Note (Signed)
NAME: [name]    MRN: [id number] DOB: 05/11/1941    DATE OF OPERATION: 04/07/2016  PREOP DIAGNOSIS: Peripheral vascular disease with gangrene of the right foot  POSTOP DIAGNOSIS: same  PROCEDURE:  1. Right common femoral artery endarterectomy 2. Right femoral to above-knee popliteal artery bypass with non-reversed translocated saphenous vein graft  SURGEON: Judeth Cornfield. Scot Dock, MD, FACS  ASSIST: Silva Bandy, [facility]  ANESTHESIA: Gen.   EBL: minimal  INDICATIONS: [name] is a 75 y.o. male who presented with a gangrenous wound of his right great toe and evidence of infrainguinal arterial occlusive disease. He underwent an arteriogram which showed superficial femoral artery occlusive dise and tibial disease with single-vessel runoff via the perineal artery. It was felt that his only chance for limb salvage was attempted revascularization. He was set up for femoral to above-knee popliteal artery bypass grafting.  FINDINGS: Anterior tibial, posterior tibial, and peroneal signal at the completion of the procedure.His creatinine was 1.8 and I did not do an intraoperative arteriogram.  TECHNIQUE: the patient was taken to the operating room and received a general anesthetic. The right lower extremity was prepped and draped in usual sterile fashion. A longitudinal incision was made in the right groin Through this incision the common femoral artery, superficial femoral artery, and deep femoral arteries were dissected free. The saphenofemoral junction was dissected free. Using 4 additional incisions along the medial aspect of the right leg the saphenous vein was harvested to the level of the knee. Branches were divided and clips and 3-0 silk ties. The vein was marginal in size.  A separate incision was made in the medial right leg between the vastus medialis and the sartorius. The dissection was carried down to the above-knee popliteal artery.This was patent A tunnel was created from his  incision to the groin incision. The vein was ligated distally and removed from its bed. The vein was clamped at the saphenofemoral junction and the vein excised from the femoral vein. The femoral vein was oversewn with a 5-0 Prolene suture. It was distended up gently with heparinized saline. It was a 3.5-4 mm vein. The patient was heparinized.  The common femoral, superficial femoral, and deep femoral arteries were controlled. A longitudinal arteriotomy was made in the common femoral artery. There was a plaque in the posterior wall that was fairly large and I felt would compromise flow into the graft. I therefore endarterectomized this and divided the plaque distally with a nice endpoint. No tacking sutures were required. The vein was used in a non-reversed fashion. He proximal valves were sharply excised. The vein was spatulated. It was sewn end-to-side to the common femoral artery using continuous 5-0 Prolene suture. Prior to completing this anastomosis the arteries were backbled and flushed appropriately and the anastomosis completed. Of note there was excellent inflow.  Next using a retrograde Mills valvulotome the valves were sharply excised. Excellent flow was established to the graft. It was then marked with an twisting.It was then brought to the previously created tunnel for anastomosis to the above-knee popliteal artery. A tourniquet was placed on the upper thigh. The leg was exsanguinated with an Esmarch bandage. The tourniquet was inflated to 300 mmHg. Under tourniquet control, a longitudinal arteriotomy was made in the above-knee popliteal artery. The vein was cut to appropriate length, spatulated, and sewn end-to-side to the above-knee popliteal artery using continuous 6-0 Prolene suture. At the completion was a good peroneal, posterior tibial, anterior tibial signal with the Doppler. I did not  do an intraoperative arteriogram as he had mild renal insufficiency.  The heparin was partially reversed  with protamine.The wounds were irrigated and hemostasis obtained. The groin incision was closed with a deeper 2-0 Vicryl, subcutaneous layer of 3-0 Vicryl, skin closed with 4-0 Vicryl. The incision for exposure of the above-knee pop the artery was closed with 2 deep layers of 3-0 Vicryl and the skin closed with 4-0 Vicryl. The vein harvest incisions were closed with a deep layer 3-0 Vicryl and the skin closed with 4-0 Vicryl. Sterile dressings were applied.The patient tolerated the procedure well and was transferred to the recovery room in stable condition. All needle and sponge counts were correct.  Peter Mayo, MD, FACS [facility] and Vein Specialists of [facility]  DATE OF DICTATION:   04/07/2016

## 2016-04-07 NOTE — Progress Notes (Signed)
Pharmacy Antibiotic Note  [name] is a 75 y.o. male admitted on 04/07/2016 with R foot gangrene.  Pharmacy has been consulted for Zosyn dosing.  Plan: Zosyn 3.375g IV q8h (4 hour infusion).  Monitor clinical progress, c/s, renal function, abx plan/LOT  Height: 5\' 10"  (177.8 cm) Weight: 161 lb (73.029 kg) IBW/kg (Calculated) : 73  Temp (24hrs), Avg:98 F (36.7 C), Min:97.9 F (36.6 C), Max:98.2 F (36.8 C)   Recent Labs Lab 04/05/16 1302 04/07/16 1212  WBC  --  11.7*  CREATININE 1.80* 1.44*    Estimated Creatinine Clearance: 47.9 mL/min (by C-G formula based on Cr of 1.44).    Allergies  Allergen Reactions  . No Known Allergies     Antimicrobials this admission: 7/14 Zosyn >>   Dose adjustments this admission: n/a  Microbiology results: 7/14 MRSA PCR: negative   Thank you for allowing [place] to participate in this patients care. Jens Som, PharmD Pager: [phone]  04/07/2016 3:10 PM

## 2016-04-07 NOTE — Progress Notes (Signed)
Patient complaining that bladder feels full and that foley is not emptying. Adjusted foley and bladder scanned patient. Scanner showed 62cc. Will continue to monitor.

## 2016-04-07 NOTE — Anesthesia Postprocedure Evaluation (Signed)
Anesthesia Post Note  Patient: Byrne Nankervis  Procedure(s) Performed: Procedure(s) (LRB): BYPASS GRAFT FEMORAL-POPLITEAL ARTERY-RIGHT (Right) VEIN HARVEST GREATER SAPHENOUS VIEN  (Right)  Patient location during evaluation: PACU Anesthesia Type: General Level of consciousness: awake, awake and alert and oriented Pain management: pain level controlled Vital Signs Assessment: post-procedure vital signs reviewed and stable Respiratory status: spontaneous breathing, nonlabored ventilation and respiratory function stable Cardiovascular status: blood pressure returned to baseline Postop Assessment: no headache Anesthetic complications: no    Last Vitals:  Filed Vitals:   04/07/16 1415 04/07/16 1417  BP: 131/60 135/63  Pulse: 81 80  Temp: 36.7 C 36.8 C  Resp: 16     Last Pain:  Filed Vitals:   04/07/16 1529  PainSc: 8                  Ramone Gander COKER

## 2016-04-07 NOTE — Progress Notes (Signed)
  Vascular and Vein Specialists Day of Surgery Note  Subjective:  Patient seen in PACU. Feels really cold. Right foot is sore.   Filed Vitals:   04/07/16 1400 04/07/16 1415  BP: 135/58 131/60  Pulse: 78 81  Temp:    Resp: 15 16   Audible doppler flow right peroneal, dorsalis pedis and posterior tibial. Peroneal is main runoff.  Right groin clean and intact. No hematoma.  Right thigh ACE wrap clean.  Right foot dressed.   Assessment/Plan:  This is a 75 y.o. male who is s/p right common femoral artery endarterectomy, right femoral to above-knee popliteal bypass with non-reversed saphenous vein graft.   Stable post-op. Bypass is patent.  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  Zosyn for right foot gangrene.  To 3S soon.    Virgina Jock, [place] Pager: [phone] 04/07/2016 2:29 PM

## 2016-04-07 NOTE — Anesthesia Procedure Notes (Signed)
Procedure Name: Intubation Date/Time: 04/07/2016 7:46 AM Performed by: Mervyn Gay Pre-anesthesia Checklist: Patient identified, Patient being monitored, Timeout performed, Emergency Drugs available and Suction available Patient Re-evaluated:Patient Re-evaluated prior to inductionOxygen Delivery Method: Circle System Utilized Preoxygenation: Pre-oxygenation with 100% oxygen Intubation Type: IV induction Ventilation: Mask ventilation without difficulty, Oral airway inserted - appropriate to patient size and Two handed mask ventilation required Laryngoscope Size: Miller and 3 Grade View: Grade I Tube type: Oral Tube size: 8.0 mm Number of attempts: 1 Airway Equipment and Method: Stylet Placement Confirmation: ETT inserted through vocal cords under direct vision,  positive ETCO2 and breath sounds checked- equal and bilateral Secured at: 23 cm Tube secured with: Tape Dental Injury: Teeth and Oropharynx as per pre-operative assessment

## 2016-04-07 NOTE — Progress Notes (Signed)
Per Dr. Linna Caprice, give 5 units novolog subQ. Insulin given.

## 2016-04-07 NOTE — Progress Notes (Signed)
Pt blood sugar 327 on Istat4. Dr. Lissa Hoard and Dr. Scot Dock notified. No new orders at this time

## 2016-04-07 NOTE — Progress Notes (Signed)
Inpatient Diabetes Program Recommendations  AACE/ADA: New Consensus Statement on Inpatient Glycemic Control (2015)  Target Ranges:  Prepandial:   less than 140 mg/dL      Peak postprandial:   less than 180 mg/dL (1-2 hours)      Critically ill patients:  140 - 180 mg/dL   Results for [name], [name] (MRN [id number]) as of 04/07/2016 11:16  Ref. Range 04/07/2016 07:19  Glucose-Capillary Latest Ref Range: 65-99 mg/dL 347 (H)   Review of Glycemic Control  Diabetes history: DM2 Outpatient Diabetes medications: Lantus 10 units daily, Amaryl 1 mg BID, Metformin 500 mg BID Current orders for Inpatient glycemic control: Currently on insulin drip in OR  Inpatient Diabetes Program Recommendations: Insulin - Basal: If patient is admitted after surgery, please consider ordering Lantus 10 units Q24H. Correction (SSI): If patient is admitted after surgery, please consider ordering CBGs with Novolog sensitive correction Q4H if NPO or ACHS if diet ordered.  Thanks, Barnie Alderman, RN, MSN, CDE Diabetes Coordinator Inpatient Diabetes Program [phone] (Team Pager from [place] to [place]) [phone] ([place] office) [phone] [facility] office) [phone] [facility] office)

## 2016-04-08 LAB — BASIC METABOLIC PANEL
Anion gap: 6 (ref 5–15)
BUN: 18 mg/dL (ref 6–20)
CO2: 22 mmol/L (ref 22–32)
Calcium: 8 mg/dL — ABNORMAL LOW (ref 8.9–10.3)
Chloride: 110 mmol/L (ref 101–111)
Creatinine, Ser: 1.6 mg/dL — ABNORMAL HIGH (ref 0.61–1.24)
GFR calc Af Amer: 48 mL/min — ABNORMAL LOW (ref >60)
GFR calc non Af Amer: 41 mL/min — ABNORMAL LOW (ref >60)
Glucose, Bld: 150 mg/dL — ABNORMAL HIGH (ref 65–99)
Potassium: 4.8 mmol/L (ref 3.5–5.1)
Sodium: 138 mmol/L (ref 135–145)

## 2016-04-08 LAB — GLUCOSE, CAPILLARY
Glucose-Capillary: 157 mg/dL — ABNORMAL HIGH (ref 65–99)
Glucose-Capillary: 182 mg/dL — ABNORMAL HIGH (ref 65–99)
Glucose-Capillary: 144 mg/dL — ABNORMAL HIGH (ref 65–99)
Glucose-Capillary: 125 mg/dL — ABNORMAL HIGH (ref 65–99)

## 2016-04-08 LAB — CBC
HCT: 24 % — ABNORMAL LOW (ref 39.0–52.0)
Hemoglobin: 7.5 g/dL — ABNORMAL LOW (ref 13.0–17.0)
MCH: 28.5 pg (ref 26.0–34.0)
MCHC: 31.3 g/dL (ref 30.0–36.0)
MCV: 91.3 fL (ref 78.0–100.0)
Platelets: 468 10*3/uL — ABNORMAL HIGH (ref 150–400)
RBC: 2.63 MIL/uL — ABNORMAL LOW (ref 4.22–5.81)
RDW: 14.2 % (ref 11.5–15.5)
WBC: 10.8 10*3/uL — ABNORMAL HIGH (ref 4.0–10.5)

## 2016-04-08 MED ORDER — ENOXAPARIN SODIUM 40 MG/0.4ML ~~LOC~~ SOLN
40.0000 mg | SUBCUTANEOUS | Status: DC
  Administered 2016-04-08 – 2016-04-09 (×2): 40 mg via SUBCUTANEOUS
  Filled 2016-04-08 (×2): qty 0.4

## 2016-04-08 NOTE — Progress Notes (Addendum)
  [facility] Progress Note  Subjective  - POD #1  Right leg incisions sore. Says "color" is coming back to right foot. Denies feeling weak or dizzy.   Objective Filed Vitals:   04/08/16 0405 04/08/16 0704  BP: 114/53 123/50  Pulse: 73 70  Temp: 98 F (36.7 C)   Resp: 18 10    Intake/Output Summary (Last 24 hours) at 04/08/16 0750 Last data filed at 04/08/16 0704  Gross per 24 hour  Intake 3281.25 ml  Output    500 ml  Net 2781.25 ml   Right groin incision and right medial thigh incisions clean. No hematoma.  Right foot with dry gangrene to plantar aspect of great toe.  Strong doppler flow right peroneal, DP and PT  Assessment/Planning: 75 y.o. male is s/p: right common femoral artery endarterectomy, right femoral to above-knee popliteal bypass with non-reversed saphenous vein graft 1 Day Post-Op   Doing well this am.  Heparin 400 units/hr was ordered yesterday due to marginal vein conduit was not started as ordered. Bypass is patent this am. Will start lovenox today for DVT prophylaxis and restart Plavix (recent CVA).  ABLA: Hgb 7.5 this am. Asymptomatic. Will hold on blood transfusion for now.  Creatinine stable at 1.6. Baseline creatinine 1.7 in June.  Dry dressing to right foot. Continue zosyn.  Mobilize and OOB today.  Transfer to [facility] 04/08/2016 7:50 AM --  Laboratory CBC    Component Value Date/Time   WBC 10.8* 04/08/2016 0000   HGB 7.5* 04/08/2016 0000   HCT 24.0* 04/08/2016 0000   PLT 468* 04/08/2016 0000    BMET    Component Value Date/Time   NA 138 04/08/2016 0000   K 4.8 04/08/2016 0000   CL 110 04/08/2016 0000   CO2 22 04/08/2016 0000   GLUCOSE 150* 04/08/2016 0000   BUN 18 04/08/2016 0000   CREATININE 1.60* 04/08/2016 0000   CREATININE 1.57* 03/20/2016 1102   CALCIUM 8.0* 04/08/2016 0000   GFRNONAA 41* 04/08/2016 0000   GFRAA 48* 04/08/2016 0000    COAG Lab Results  Component Value Date   INR  1.09 04/07/2016   INR 1.05 02/27/2016   INR 0.99 01/23/2014   No results found for: PTT  Antibiotics Anti-infectives    Start     Dose/Rate Route Frequency Ordered Stop   04/07/16 1600  piperacillin-tazobactam (ZOSYN) IVPB 3.375 g     3.375 g 12.5 mL/hr over 240 Minutes Intravenous Every 8 hours 04/07/16 1450     04/07/16 0700  cefUROXime (ZINACEF) 1.5 g in dextrose 5 % 50 mL IVPB     1.5 g 100 mL/hr over 30 Minutes Intravenous To ShortStay Surgical 04/06/16 1007 04/07/16 0750   04/07/16 0555  dextrose 5 % with cefUROXime (ZINACEF) ADS Med    Comments:  Debbe Bales, Meredit: cabinet override      04/07/16 0555 04/07/16 [place], PA-C [facility] Office: [phone] Pager: [phone] 04/08/2016 7:50 AM  Addendum  I have independently interviewed and examined the patient, and I agree with the physician assistant's findings.  Inc c/d/i, warm foot with dopplerable signals.  Ok transfer to floor.  Agree no need to immediately transfuse.  Adele Barthel, MD [facility] [place] Office: [phone] Pager: [phone]  04/08/2016, 9:53 AM

## 2016-04-08 NOTE — Progress Notes (Signed)
Called report to [facility] on [place], will transfer pt in wheelchair on telemetry. Consuelo Pandy RN

## 2016-04-08 NOTE — Progress Notes (Signed)
Pt received from ICU nurse tech. Pt oriented to room and equipment. Pt VSS, telemetry applied, CCMD notified. Call light within reach, will continue to monitor.   Fritz Pickerel, RN

## 2016-04-08 NOTE — Progress Notes (Signed)
After reviewing orders at shift change I noticed that pt was not on heparin gtt, Virgina Jock PAC at bedside. I verified with her that pt was suppose to be on heparin gtt, she stated yes. I informed her that pt had not been on one all night. She started lovenox and pt had good doppler pulses in right foot. Safety zone portal completed. Consuelo Pandy RN

## 2016-04-08 NOTE — Evaluation (Signed)
Physical Therapy Evaluation Patient Details Name: [name] MRN: [id number] DOB: 1941/09/11 Today's Date: 04/08/2016   History of Present Illness  75 y.o. 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 Will need continued skilled PT to address deficits and maximize function. Will see as indicated and progress as tolerated.    Follow Up Recommendations Home health PT    Equipment Recommendations  Rolling walker with 5" wheels    Recommendations for Other Services       Precautions / Restrictions Precautions Precautions: Fall Restrictions Weight Bearing Restrictions: No      Mobility  Bed Mobility Overal bed mobility: Needs Assistance Bed Mobility: Supine to Sit;Sit to Supine     Supine to sit: Supervision Sit to supine: Supervision   General bed mobility comments: increased time to perform, no physical assist required. VCs for safety  Transfers Overall transfer level: Needs assistance Equipment used: Rolling walker (2 wheeled) Transfers: Sit to/from Stand Sit to Stand: Min assist         General transfer comment: VCs for hand placement, 3 attempts to come to standing dur to spasm like sharp pain in RLE  Ambulation/Gait Ambulation/Gait assistance: Min guard Ambulation Distance (Feet): 70 Feet Assistive device: Rolling walker (2 wheeled) Gait Pattern/deviations: Step-to pattern;Decreased stride length;Decreased weight shift to right;Antalgic;Narrow base of support Gait velocity: decreased   General Gait Details: Moderate cues for sequnecing and safety with use of RW. Patient with intermittent step through attempt but mostly step to gait pattern. Reports pain and tightness in right groin  region  Stairs         General stair comments: did not attempt this session  Wheelchair Mobility    Modified Rankin (Stroke Patients Only)       Balance     Sitting balance-Leahy Scale: Normal     Standing balance support: Bilateral upper extremity supported Standing balance-Leahy Scale: Fair Standing balance comment: reliance on RW for UE support                             Pertinent Vitals/Pain Pain Assessment: 0-10 Pain Score: 8  Pain Location: right LE and groin area Pain Descriptors / Indicators: Aching;Operative site guarding;Sharp;Sore    Home Living Family/patient expects to be discharged to:: Private residence Living Arrangements: Spouse/significant other Available Help at Discharge: Family;Available PRN/intermittently Type of Home: House Home Access: Stairs to enter Entrance Stairs-Rails: Right;Left;Can reach both Entrance Stairs-Number of Steps: 7 Home Layout: Two level;Able to live on main level with bedroom/bathroom Home Equipment: Kasandra Knudsen - single point Additional Comments: Spouse does work.     Prior Function Level of Independence: Independent               Hand Dominance   Dominant Hand: Right    Extremity/Trunk Assessment   Upper Extremity Assessment: Overall WFL for tasks assessed           Lower Extremity Assessment: RLE deficits/detail         Communication   Communication: No difficulties  Cognition Arousal/Alertness: Awake/alert Behavior During Therapy: Impulsive Overall Cognitive Status: No family/caregiver present to determine baseline cognitive functioning  General Comments General comments (skin integrity, edema, etc.): educated on elevation for edema control    Exercises        Assessment/Plan    PT Assessment Patient needs continued PT services  PT Diagnosis Difficulty walking   PT Problem List Decreased strength;Decreased range of motion;Decreased activity  tolerance;Decreased balance;Decreased mobility  PT Treatment Interventions DME instruction;Gait training;Functional mobility training;Stair training;Therapeutic activities;Therapeutic exercise   PT Goals (Current goals can be found in the Care Plan section) Acute Rehab PT Goals Patient Stated Goal: go home PT Goal Formulation: With patient Time For Goal Achievement: 04/10/16 Potential to Achieve Goals: Good    Frequency Min 3X/week   Barriers to discharge        Co-evaluation               End of Session Equipment Utilized During Treatment: Gait belt Activity Tolerance: Patient tolerated treatment well Patient left: in bed;with call bell/phone within reach;with bed alarm set Nurse Communication: Mobility status         Time: [id number] PT Time Calculation (min) (ACUTE ONLY): 21 min   Charges:   PT Evaluation $PT Eval Moderate Complexity: 1 Procedure     PT G CodesDuncan Dull 05/01/2016, 3:15 PM Alben Deeds, [place] DPT  [phone]

## 2016-04-09 LAB — BASIC METABOLIC PANEL
Anion gap: 6 (ref 5–15)
BUN: 23 mg/dL — ABNORMAL HIGH (ref 6–20)
CO2: 23 mmol/L (ref 22–32)
Calcium: 7.9 mg/dL — ABNORMAL LOW (ref 8.9–10.3)
Chloride: 107 mmol/L (ref 101–111)
Creatinine, Ser: 1.73 mg/dL — ABNORMAL HIGH (ref 0.61–1.24)
GFR calc Af Amer: 44 mL/min — ABNORMAL LOW (ref >60)
GFR calc non Af Amer: 38 mL/min — ABNORMAL LOW (ref >60)
Glucose, Bld: 194 mg/dL — ABNORMAL HIGH (ref 65–99)
Potassium: 4.5 mmol/L (ref 3.5–5.1)
Sodium: 136 mmol/L (ref 135–145)

## 2016-04-09 LAB — CBC
HCT: 23.5 % — ABNORMAL LOW (ref 39.0–52.0)
Hemoglobin: 7.4 g/dL — ABNORMAL LOW (ref 13.0–17.0)
MCH: 28.8 pg (ref 26.0–34.0)
MCHC: 31.5 g/dL (ref 30.0–36.0)
MCV: 91.4 fL (ref 78.0–100.0)
Platelets: 417 10*3/uL — ABNORMAL HIGH (ref 150–400)
RBC: 2.57 MIL/uL — ABNORMAL LOW (ref 4.22–5.81)
RDW: 14.2 % (ref 11.5–15.5)
WBC: 10.8 10*3/uL — ABNORMAL HIGH (ref 4.0–10.5)

## 2016-04-09 LAB — GLUCOSE, CAPILLARY
Glucose-Capillary: 178 mg/dL — ABNORMAL HIGH (ref 65–99)
Glucose-Capillary: 266 mg/dL — ABNORMAL HIGH (ref 65–99)
Glucose-Capillary: 284 mg/dL — ABNORMAL HIGH (ref 65–99)
Glucose-Capillary: 169 mg/dL — ABNORMAL HIGH (ref 65–99)

## 2016-04-09 NOTE — Progress Notes (Addendum)
  [facility] Progress Note  Subjective  - POD #2  Soreness with incisions. Right toe still hurts but improved from pre-op.   Objective Filed Vitals:   04/08/16 2015 04/09/16 0556  BP: 147/61 154/58  Pulse: 79 71  Temp: 98.5 F (36.9 C) 98.1 F (36.7 C)  Resp: 18 16    Intake/Output Summary (Last 24 hours) at 04/09/16 0819 Last data filed at 04/09/16 0556  Gross per 24 hour  Intake    700 ml  Output    500 ml  Net    200 ml   Right groin and thigh incisions clean and intact. Right great toe with eschar at plantar aspect with some fibrinous exudate. No odor. No drainage.  Right DP, PT and peroneal doppler signals.   Assessment/Planning: 75 y.o. male is s/p: right common femoral artery endarterectomy, right femoral to above-knee popliteal bypass with non-reversed saphenous vein graft 2 Days Post-Op   Bypass is patent with strong doppler flow. Continue dry dressings to right foot.  Continue to mobilize. ABLA asymptomatic. Monitor.  Plan d/c home tomorrow with [facility] services.   Alvia Grove 04/09/2016 8:19 AM --  Laboratory CBC    Component Value Date/Time   WBC 10.8* 04/09/2016 0219   HGB 7.4* 04/09/2016 0219   HCT 23.5* 04/09/2016 0219   PLT 417* 04/09/2016 0219    BMET    Component Value Date/Time   NA 136 04/09/2016 0219   K 4.5 04/09/2016 0219   CL 107 04/09/2016 0219   CO2 23 04/09/2016 0219   GLUCOSE 194* 04/09/2016 0219   BUN 23* 04/09/2016 0219   CREATININE 1.73* 04/09/2016 0219   CREATININE 1.57* 03/20/2016 1102   CALCIUM 7.9* 04/09/2016 0219   GFRNONAA 38* 04/09/2016 0219   GFRAA 44* 04/09/2016 0219    COAG Lab Results  Component Value Date   INR 1.09 04/07/2016   INR 1.05 02/27/2016   INR 0.99 01/23/2014   No results found for: PTT  Antibiotics Anti-infectives    Start     Dose/Rate Route Frequency Ordered Stop   04/07/16 1600  piperacillin-tazobactam (ZOSYN) IVPB 3.375 g     3.375 g 12.5 mL/hr over 240  Minutes Intravenous Every 8 hours 04/07/16 1450     04/07/16 0700  cefUROXime (ZINACEF) 1.5 g in dextrose 5 % 50 mL IVPB     1.5 g 100 mL/hr over 30 Minutes Intravenous To ShortStay Surgical 04/06/16 1007 04/07/16 0750   04/07/16 0555  dextrose 5 % with cefUROXime (ZINACEF) ADS Med    Comments:  Debbe Bales, Meredit: cabinet override      04/07/16 0555 04/07/16 [place], PA-C [facility] Office: [phone] Pager: [phone] 04/09/2016 8:19 AM   Addendum  I have independently interviewed and examined the patient, and I agree with the physician assistant's findings.  Pt still with marginal pain control this AM.  Will keep her another day.  Dr. Scot Dock back tomorrow.  Home PT will need to be coordinated.  ABI pending  Adele Barthel, MD [facility] [place] Office: [phone] Pager: [phone]  04/09/2016, 9:11 AM

## 2016-04-09 NOTE — Progress Notes (Signed)
Physical Therapy Treatment Patient Details Name: [name] MRN: [id number] DOB: 04/21/1941 Today's Date: 04/09/2016    History of Present Illness 75 y.o. 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   PMH:  CHF, CVA    PT Comments    Patient making good progress with mobility and gait.  Initiated stair training today.  Patient with difficulty remembering sequencing going up/down stairs.  Follow Up Recommendations  Home health PT     Equipment Recommendations  Rolling walker with 5" wheels    Recommendations for Other Services       Precautions / Restrictions Precautions Precautions: Fall Restrictions Weight Bearing Restrictions: No    Mobility  Bed Mobility               General bed mobility comments: Patient OOB  Transfers Overall transfer level: Needs assistance Equipment used: Rolling walker (2 wheeled) Transfers: Sit to/from Stand Sit to Stand: Min guard         General transfer comment: Verbal cues for hand placement to rise from bed.  Patient sat on EOB abruptly at end of gait.  Discussed safety with returning to sitting.  Able to sit in chair in more controlled manner.  Ambulation/Gait Ambulation/Gait assistance: Supervision Ambulation Distance (Feet): 200 Feet Assistive device: Rolling walker (2 wheeled) Gait Pattern/deviations: Step-through pattern;Decreased stance time - right;Decreased step length - left;Decreased step length - right;Decreased stride length;Decreased weight shift to right;Antalgic Gait velocity: decreased Gait velocity interpretation: Below normal speed for age/gender General Gait Details: Verbal cues for safety - patient impulsive, letting go of RW with both hands while talking.   Two standing rest breaks due to pain.   Stairs Stairs: Yes Stairs assistance: Min guard Stair Management: Two  rails;Step to pattern;Forwards Number of Stairs: 4 General stair comments: Verbal cues for safe negotiation of stairs using 2 rails and step-to pattern.  Required repeated cueing for sequencing.   Wheelchair Mobility    Modified Rankin (Stroke Patients Only)       Balance                                    Cognition Arousal/Alertness: Awake/alert Behavior During Therapy: Impulsive Overall Cognitive Status: No family/caregiver present to determine baseline cognitive functioning                      Exercises      General Comments        Pertinent Vitals/Pain Pain Assessment: 0-10 Pain Score: 9  Pain Location: Rt groin and LE Pain Descriptors / Indicators: Aching;Sore Pain Intervention(s): Monitored during session;Repositioned    Home Living                      Prior Function            PT Goals (current goals can now be found in the care plan section) Acute Rehab PT Goals Patient Stated Goal: go home Progress towards PT goals: Progressing toward goals    Frequency  Min 3X/week    PT Plan Current plan remains appropriate    Co-evaluation             End of Session Equipment Utilized During Treatment: Gait belt Activity Tolerance: Patient tolerated treatment well Patient left: in chair;with call bell/phone within  reach     Time: 1505-1540 PT Time Calculation (min) (ACUTE ONLY): 35 min  Charges:  $Gait Training: 23-37 mins                    G Codes:      [name] 2016/04/13, 5:19 PM [name], [name] Pager [phone]

## 2016-04-09 NOTE — Progress Notes (Signed)
OT Cancellation Note  Patient Details Name: [name] MRN: [id number] DOB: 12/18/1940   Cancelled Treatment:    Reason Eval/Treat Not Completed: Other (comment) (pt eating dinner). Will follow up for OT eval tomorrow as time allows.  Binnie Kand M.S., OTR/L Pager: [phone]  04/09/2016, 4:53 PM

## 2016-04-10 ENCOUNTER — Encounter: Payer: Medicare Other | Admitting: Occupational Therapy

## 2016-04-10 ENCOUNTER — Ambulatory Visit: Payer: Medicare Other | Admitting: Rehabilitation

## 2016-04-10 ENCOUNTER — Inpatient Hospital Stay (HOSPITAL_COMMUNITY): Payer: Medicare Other

## 2016-04-10 ENCOUNTER — Encounter (HOSPITAL_COMMUNITY): Payer: Self-pay | Admitting: Vascular Surgery

## 2016-04-10 ENCOUNTER — Telehealth: Payer: Self-pay | Admitting: Vascular Surgery

## 2016-04-10 DIAGNOSIS — I70261 Atherosclerosis of native arteries of extremities with gangrene, right leg: Secondary | ICD-10-CM

## 2016-04-10 LAB — GLUCOSE, CAPILLARY
Glucose-Capillary: 202 mg/dL — ABNORMAL HIGH (ref 65–99)
Glucose-Capillary: 172 mg/dL — ABNORMAL HIGH (ref 65–99)

## 2016-04-10 MED ORDER — CEPHALEXIN 500 MG PO CAPS: 500.0000 mg | ORAL_CAPSULE | Freq: Three times a day (TID) | ORAL | Status: DC

## 2016-04-10 MED ORDER — DIPHENHYDRAMINE HCL 25 MG PO CAPS
25.0000 mg | ORAL_CAPSULE | Freq: Four times a day (QID) | ORAL | Status: DC | PRN
  Administered 2016-04-10 (×2): 25 mg via ORAL
  Filled 2016-04-10 (×2): qty 1

## 2016-04-10 MED ORDER — HYDROCODONE-ACETAMINOPHEN 5-325 MG PO TABS: 1.0000 | ORAL_TABLET | Freq: Four times a day (QID) | ORAL | Status: DC | PRN

## 2016-04-10 NOTE — Progress Notes (Signed)
VASCULAR LAB PRELIMINARY  PRELIMINARY  PRELIMINARY  PRELIMINARY  VASCULAR LAB PRELIMINARY  ARTERIAL  ABI completed:    RIGHT    LEFT    PRESSURE WAVEFORM  PRESSURE WAVEFORM  BRACHIAL 133 Triphasic BRACHIAL 134 Triphasic  AT 86 monophasic AT 168 Biphasic  PT 94 monophasic PT 165 Biphasic    RIGHT LEFT  ABI 0.70 1.23     Right ABI's appears to be  moderate arterial disease  with monophasic waveforms.  Left  ABI's are with on normal limits. However, waveforms are biphasic, appears mild arterial disease.    Janifer Adie, RVT, RDMS 04/10/2016, 12:18 PM

## 2016-04-10 NOTE — Care Management Important Message (Signed)
Important Message  Patient Details  Name: [name] MRN: [id number] Date of Birth: 03-29-1941   Medicare Important Message Given:  Yes    [name] 04/10/2016, 3:00 PM

## 2016-04-10 NOTE — Telephone Encounter (Signed)
-----   Message from Mena Goes, RN sent at 04/10/2016  9:15 AM EDT ----- Regarding: 2 weeks postop    ----- Message -----    From: Dario Ave    Sent: 04/10/2016   8:15 AM      To: Vvs Charge Pool Subject: Kay's log                                        ----- Message -----    From: Alvia Grove, PA-C    Sent: 04/10/2016   7:42 AM      To: Vvs Charge Pool  S/p right common femoral artery endarterectomy, right femoral to above-knee popliteal bypass with non-reversed saphenous vein graft 04/07/16  F/u with [facility] in 2 weeks  Thanks Maudie Mercury

## 2016-04-10 NOTE — Care Management Note (Signed)
Case Management Note Marvetta Gibbons RN, BSN Unit 2W-Case Manager [phone]  Patient Details  Name: [name] MRN: [id number] Date of Birth: 07-09-1941  Subjective/Objective:    Pt admitted  S/p- right common femoral artery endarterectomy, right femoral to above-knee popliteal bypass               Action/Plan: PTA pt lived at home with spouse- plan to return home- order placed for HHPT per PT recommendations- spoke with pt and wife at bedside- choice offered for [facility] agency (list provided)- per wife they have used [facility] in past and would like to use them again. Referral called to [place] with [facility] for HHPT- pts' address confirmed as [address], [place] [address] phone [phone]-- pt states that he has a RW at home- no further CM needs noted.   Expected Discharge Date: 04/10/16              Expected Discharge Plan:  [place]  In-House Referral:     Discharge planning Services  CM Consult  Post Acute Care Choice:  Home Health Choice offered to:  Patient, Spouse  DME Arranged:    DME Agency:     HH Arranged:  PT [place]:  [place]  Status of Service:  Completed, signed off  If discussed at [place] of Stay Meetings, dates discussed:    Additional Comments:  Peter Patricia, RN 04/10/2016, 2:39 PM

## 2016-04-10 NOTE — Progress Notes (Addendum)
  [facility] Progress Note Subjective  - POD #3  Complaining of itching around incisions and IV site. Pain with right foot slightly improved.   Objective Filed Vitals:   04/09/16 2043 04/10/16 0416  BP: 125/58 152/54  Pulse: 74 66  Temp: 98 F (36.7 C) 98 F (36.7 C)  Resp: 19 17    Intake/Output Summary (Last 24 hours) at 04/10/16 0734 Last data filed at 04/10/16 0417  Gross per 24 hour  Intake    720 ml  Output   1050 ml  Net   -330 ml   Doppler flow right peroneal, DP and PT Right leg incisions healing well. No erythema. No drainage.  Right great toe ulceration plantar aspect with serous drainage. No odor. No purulence.   Assessment/Planning: 75 y.o. male is s/p: right common femoral artery endarterectomy, right femoral to above-knee popliteal bypass with non-reversed saphenous vein graft 3 Days Post-Op   Bypass graft is patent.  Will need to continue dressing changes to right great toe at home. Discussed that he is at risk of needing a toe amputation in the future.  Will ask Dr. Scot Dock what po abx to send pt home on.  Making good progress with mobility.   Pain well controlled on po pain meds.  D/c home today with HHPT.   VQI: On ASA, plavix and statin.   Alvia Grove 04/10/2016 7:34 AM  I have interviewed the patient and examined the patient. I agree with the findings by the PA. Home today with HHPT on po Keflex. Dr. Sharol Given can follow the toe as an outpt.  Gae Gallop, MD [phone]  Laboratory CBC    Component Value Date/Time   WBC 10.8* 04/09/2016 0219   HGB 7.4* 04/09/2016 0219   HCT 23.5* 04/09/2016 0219   PLT 417* 04/09/2016 0219    BMET    Component Value Date/Time   NA 136 04/09/2016 0219   K 4.5 04/09/2016 0219   CL 107 04/09/2016 0219   CO2 23 04/09/2016 0219   GLUCOSE 194* 04/09/2016 0219   BUN 23* 04/09/2016 0219   CREATININE 1.73* 04/09/2016 0219   CREATININE 1.57* 03/20/2016 1102   CALCIUM 7.9*  04/09/2016 0219   GFRNONAA 38* 04/09/2016 0219   GFRAA 44* 04/09/2016 0219    COAG Lab Results  Component Value Date   INR 1.09 04/07/2016   INR 1.05 02/27/2016   INR 0.99 01/23/2014   No results found for: PTT  Antibiotics Anti-infectives    Start     Dose/Rate Route Frequency Ordered Stop   04/07/16 1600  piperacillin-tazobactam (ZOSYN) IVPB 3.375 g     3.375 g 12.5 mL/hr over 240 Minutes Intravenous Every 8 hours 04/07/16 1450     04/07/16 0700  cefUROXime (ZINACEF) 1.5 g in dextrose 5 % 50 mL IVPB     1.5 g 100 mL/hr over 30 Minutes Intravenous To ShortStay Surgical 04/06/16 1007 04/07/16 0750   04/07/16 0555  dextrose 5 % with cefUROXime (ZINACEF) ADS Med    Comments:  Debbe Bales, Meredit: cabinet override      04/07/16 0555 04/07/16 [place], PA-C [facility] Office: [phone] Pager: [phone] 04/10/2016 7:34 AM

## 2016-04-10 NOTE — Progress Notes (Signed)
Physical Therapy Treatment Patient Details Name: [name] MRN: [id number] DOB: 04-03-1941 Today's Date: 04/10/2016    History of Present Illness 75 y.o. 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   PMH:  CHF, CVA    PT Comments    Pt admitted with above diagnosis. Pt currently with functional limitations due to safety and endurance deficits. Pt ambulated with RW with overall better safety. Feel pt is probably at baseline and wife can supervise at home.  Progressing well overall.   Pt will benefit from skilled PT to increase their independence and safety with mobility to allow discharge to the venue listed below.    Follow Up Recommendations  Home health PT;Supervision/Assistance - 24 hour (HHOT)     Equipment Recommendations  Rolling walker with 5" wheels    Recommendations for Other Services       Precautions / Restrictions Precautions Precautions: Fall Precaution Comments: Rt foot wound Restrictions Weight Bearing Restrictions: No    Mobility  Bed Mobility Overal bed mobility: Needs Assistance Bed Mobility: Supine to Sit     Supine to sit: Independent Sit to supine: Independent   General bed mobility comments: HOB flat, no use of bedrails, exited on L side of bed to simulate home environment.  Transfers Overall transfer level: Modified independent Equipment used: Rolling walker (2 wheeled) Transfers: Sit to/from Stand Sit to Stand: Modified independent (Device/Increase time)         General transfer comment: Safe with transfers sit to stand.  Ambulation/Gait Ambulation/Gait assistance: Supervision Ambulation Distance (Feet): 400 Feet Assistive device: Rolling walker (2 wheeled) Gait Pattern/deviations: Step-through pattern;Decreased stride length Gait velocity: decreased Gait velocity interpretation: Below normal  speed for age/gender General Gait Details: Verbal cues for safety - patient impulsive at times, letting go of RW with one hand while talking.     Stairs Stairs: Yes Stairs assistance: Min guard Stair Management: Two rails;Step to pattern;Forwards Number of Stairs: 5 General stair comments: Verbal cues for safe negotiation of stairs using 2 rails and step-to pattern.  Required repeated cueing for sequencing.   Wheelchair Mobility    Modified Rankin (Stroke Patients Only)       Balance Overall balance assessment: Needs assistance Sitting-balance support: No upper extremity supported;Feet supported Sitting balance-Leahy Scale: Normal     Standing balance support: Bilateral upper extremity supported;During functional activity Standing balance-Leahy Scale: Fair Standing balance comment: relies on RW for balance with ambulation however can stand statically without RW.                    Cognition Arousal/Alertness: Awake/alert Behavior During Therapy: WFL for tasks assessed/performed Overall Cognitive Status: Impaired/Different from baseline Area of Impairment: Safety/judgement;Memory Orientation Level: Disoriented to;Time   Memory: Decreased short-term memory   Safety/Judgement: Decreased awareness of safety     General Comments: Pt with decreased safety awareness and becomes easily distracted during tasks.     Exercises General Exercises - Lower Extremity Ankle Circles/Pumps: AROM;Both;10 reps;Seated Long Arc Quad: AROM;Both;10 reps;Seated    General Comments        Pertinent Vitals/Pain Pain Assessment: Faces Pain Score: 9  Faces Pain Scale: Hurts little more Pain Location: right groin Pain Descriptors / Indicators: Sore Pain Intervention(s): Limited activity within patient's tolerance;Monitored during session;Patient requesting pain meds-RN notified;Repositioned  VSS    Home Living Family/patient expects to be discharged to:: Private  residence Living Arrangements:  Spouse/significant other Available Help at Discharge: Family;Available PRN/intermittently Type of Home: House Home Access: Stairs to enter Entrance Stairs-Rails: Right;Left;Can reach both Home Layout: Two level;Able to live on main level with bedroom/bathroom Home Equipment: Gilford Rile - 2 wheels;Cane - single point;Bedside commode;Shower seat;Grab bars - toilet;Grab bars - tub/shower;Hand held shower head Additional Comments: Spouse works as a Automotive engineer at [employer].    Prior Function Level of Independence: Independent with assistive device(s)      Comments: Occasionally used RW. Retired Administrator.   PT Goals (current goals can now be found in the care plan section) Acute Rehab PT Goals Patient Stated Goal: to go home this afternoon Progress towards PT goals: Progressing toward goals    Frequency  Min 3X/week    PT Plan Current plan remains appropriate    Co-evaluation             End of Session Equipment Utilized During Treatment: Gait belt Activity Tolerance: Patient tolerated treatment well Patient left: in bed;with call bell/phone within reach;with bed alarm set     Time: [id number] PT Time Calculation (min) (ACUTE ONLY): 19 min  Charges:                       G CodesIrwin Brakeman F 2016/05/01, 11:06 AM  [facility] [phone] (pager)

## 2016-04-10 NOTE — Telephone Encounter (Signed)
Sched appt 8/9 at 8:30. Lm on hm# to inform pt of appt.

## 2016-04-10 NOTE — Progress Notes (Signed)
Occupational Therapy Evaluation Patient Details Name: [name] MRN: [id number] DOB: 13-May-1941 Today's Date: 04/10/2016    History of Present Illness 75 y.o. 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   PMH:  CHF, CVA   Clinical Impression   PTA, pt was independent with ADLs and occasionally used SPC for mobility. Pt currently requires min assist for LB ADLs and supervision for basic transfers due to decreased safety awareness and increased distractibility. Pt plans to d/c home with intermittent assistance from his wife and daughter. Pt will benefit from continued acute OT to increase independence and safety with ADLs and mobility to allow for safe discharge home. No OT follow up or DME recommended at this time.     Follow Up Recommendations  No OT follow up;Supervision - Intermittent    Equipment Recommendations  None recommended by OT    Recommendations for Other Services       Precautions / Restrictions Restrictions Weight Bearing Restrictions: No      Mobility Bed Mobility Overal bed mobility: Needs Assistance Bed Mobility: Supine to Sit           General bed mobility comments: HOB flat, no use of bedrails, exited on L side of bed to simulate home environment.  Transfers Overall transfer level: Needs assistance Equipment used: Rolling walker (2 wheeled) Transfers: Sit to/from Stand Sit to Stand: Supervision         General transfer comment: Supervision for safety. Pt continues to "flop" into recliner despite cues due to increased RLE pain.    Balance Overall balance assessment: Needs assistance Sitting-balance support: No upper extremity supported;Feet supported Sitting balance-Leahy Scale: Normal     Standing balance support: No upper extremity supported;During functional activity Standing balance-Leahy Scale:  Fair Standing balance comment: able to maintain balance without UE support for static standing balance                            ADL Overall ADL's : Needs assistance/impaired Eating/Feeding: Modified independent;Sitting   Grooming: Wash/dry hands;Supervision/safety;Standing   Upper Body Bathing: Supervision/ safety;Sitting   Lower Body Bathing: Minimal assistance;Sit to/from stand Lower Body Bathing Details (indicate cue type and reason): wife or daughter can assist Upper Body Dressing : Supervision/safety;Sitting   Lower Body Dressing: Minimal assistance;Sit to/from stand Lower Body Dressing Details (indicate cue type and reason): wife or daughter can assist Toilet Transfer: Supervision/safety;Ambulation;Regular Toilet;Grab bars;RW   Toileting- Water quality scientist and Hygiene: Supervision/safety;Sit to/from stand   Tub/ Shower Transfer: Walk-in shower;Supervision/safety;Ambulation;Rolling walker   Functional mobility during ADLs: Supervision/safety;Rolling walker       Vision Vision Assessment?: No apparent visual deficits   Perception     Praxis      Pertinent Vitals/Pain Pain Assessment: 0-10 Pain Score: 9  Pain Location: R groin Pain Descriptors / Indicators: Sore Pain Intervention(s): Limited activity within patient's tolerance;Monitored during session;Repositioned;Premedicated before session     Hand Dominance Right   Extremity/Trunk Assessment Upper Extremity Assessment Upper Extremity Assessment: Overall WFL for tasks assessed   Lower Extremity Assessment Lower Extremity Assessment: RLE deficits/detail RLE Deficits / Details: decreased strength and ROM as expected post op RLE: Unable to fully assess due to pain RLE Sensation: history of peripheral neuropathy   Cervical / Trunk Assessment Cervical / Trunk Assessment: Normal   Communication Communication Communication: No difficulties   Cognition Arousal/Alertness: Awake/alert Behavior  During Therapy: WFL for tasks assessed/performed Overall Cognitive Status: Impaired/Different from baseline Area of Impairment: Memory;Orientation;Safety/judgement Orientation Level: Disoriented to;Time   Memory: Decreased short-term memory   Safety/Judgement: Decreased awareness of safety     General Comments: Pt thought he was eating dinner on OT arrival and required reassurance multiple times that it was 8:00 AM and he was eating breakfast throughout entire session. Pt also with decreased safety awareness and becomes easily distracted during tasks.    General Comments       Exercises       Shoulder Instructions      Home Living Family/patient expects to be discharged to:: Private residence Living Arrangements: Spouse/significant other Available Help at Discharge: Family;Available PRN/intermittently Type of Home: House Home Access: Stairs to enter [employer] of Steps: 7 Entrance Stairs-Rails: Right;Left;Can reach both Home Layout: Two level;Able to live on main level with bedroom/bathroom Alternate Level Stairs-Number of Steps: 12 Alternate Level Stairs-Rails: Right;Left Bathroom Shower/Tub: Occupational psychologist: Standard     Home Equipment: Environmental consultant - 2 wheels;Cane - single point;Bedside commode;Shower seat;Grab bars - toilet;Grab bars - tub/shower;Hand held shower head   Additional Comments: Spouse works as a Automotive engineer at [employer].      Prior Functioning/Environment Level of Independence: Independent with assistive device(s)        Comments: Occasionally used RW. Retired Administrator.    OT Diagnosis: Acute pain   OT Problem List: Decreased strength;Decreased range of motion;Impaired balance (sitting and/or standing);Decreased activity tolerance;Decreased safety awareness;Decreased knowledge of use of DME or AE;Pain;Impaired sensation   OT Treatment/Interventions: Self-care/ADL training;Therapeutic exercise;Energy conservation;DME and/or AE  instruction;Therapeutic activities;Patient/family education;Balance training    OT Goals(Current goals can be found in the care plan section) Acute Rehab OT Goals Patient Stated Goal: to go home this afternoon OT Goal Formulation: With patient Time For Goal Achievement: 04/24/16 Potential to Achieve Goals: Good ADL Goals Pt Will Perform Lower Body Dressing: with modified independence;with adaptive equipment;sit to/from stand Pt Will Transfer to Toilet: with modified independence;ambulating;regular height toilet;grab bars Pt Will Perform Toileting - Clothing Manipulation and hygiene: with modified independence;sit to/from stand Pt Will Perform Tub/Shower Transfer: Shower transfer;with supervision;ambulating;shower seat;grab bars;rolling walker Additional ADL Goal #1: Pt will verbalize 2 fall prevention strategies with no verbal cues to increase safety at home.  OT Frequency: Min 2X/week   Barriers to D/C:            Co-evaluation              End of Session Equipment Utilized During Treatment: Gait belt;Rolling walker Nurse Communication: Mobility status  Activity Tolerance: Patient tolerated treatment well Patient left: in chair;with call bell/phone within reach   Time: 0755-0817 OT Time Calculation (min): 22 min Charges:  OT General Charges $OT Visit: 1 Procedure OT Evaluation $OT Eval Moderate Complexity: 1 Procedure G-Codes:    Redmond Baseman, OTR/L [phone] 04/10/2016, 8:31 AM

## 2016-04-10 NOTE — Progress Notes (Signed)
[name] to be D/C'd Home per MD order. Discussed with the patient and all questions fully answered.    Medication List    STOP taking these medications        doxycycline 50 MG capsule  Commonly known as:  VIBRAMYCIN      TAKE these medications        aspirin 325 MG EC tablet  Take 1 tablet (325 mg total) by mouth daily.     carvedilol 6.25 MG tablet  Commonly known as:  COREG  Take 1 tablet (6.25 mg total) by mouth 2 (two) times daily with a meal.     cephALEXin 500 MG capsule  Commonly known as:  KEFLEX  Take 1 capsule (500 mg total) by mouth 3 (three) times daily.     clopidogrel 75 MG tablet  Commonly known as:  PLAVIX  Take 1 tablet (75 mg total) by mouth daily with breakfast.     docusate sodium 100 MG capsule  Commonly known as:  COLACE  Take 1 capsule (100 mg total) by mouth 2 (two) times daily.     feeding supplement (GLUCERNA SHAKE) Liqd  Take 237 mLs by mouth daily.     glimepiride 1 MG tablet  Commonly known as:  AMARYL  Take 1 mg by mouth 2 (two) times daily.     HYDROcodone-acetaminophen 5-325 MG tablet  Commonly known as:  NORCO/VICODIN  Take 1 tablet by mouth every 6 (six) hours as needed for moderate pain.     insulin glargine 100 UNIT/ML injection  Commonly known as:  LANTUS  Inject 0.1 mLs (10 Units total) into the skin daily.     losartan 100 MG tablet  Commonly known as:  COZAAR  Take 1 tablet (100 mg total) by mouth daily.     metFORMIN 500 MG tablet  Commonly known as:  GLUCOPHAGE  Take 500 mg by mouth 2 (two) times daily with a meal.     NIFEREX Tabs  Take 150 mg by mouth daily.     polyethylene glycol packet  Commonly known as:  MIRALAX / GLYCOLAX  Take 17 g by mouth daily.     simvastatin 40 MG tablet  Commonly known as:  ZOCOR  Take 40 mg by mouth daily.        VVS, Skin clean, dry and intact without evidence of skin break down, no evidence of skin tears noted.  IV catheter discontinued intact. Site without signs and  symptoms of complications. Dressing and pressure applied.  An After Visit Summary was printed and given to the patient.  Patient escorted via [place], and D/C home via private auto.  Cyndra Numbers  04/10/2016 4:26 PM

## 2016-04-13 NOTE — Discharge Summary (Signed)
[facility] Discharge Summary  [name] 02-03-1941 74 y.o. 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 male who presented for evaluation of severe peripheral occlusive disease and gangrenous changes of his right foot. He saw Dr. Sharol Given recently with debridement at his right foot. He has extensive swelling and changes of inflammation in his right foot. He has an open wound probably 3 cm in diameter at the plantar aspect at the base of his great toe. He does have skin slough around this area extending into the medial aspect. No obvious deep tissue infection. Does have extensive history of congestive heart failure and recent history of stroke. Is recovering from a stroke.  Hospital Course:  The patient was admitted to the hospital and taken to the operating room on 04/07/2016 and underwent:  1. Right common femoral artery endarterectomy 2. Right femoral to above-knee popliteal artery bypass with non-reversed translocated saphenous vein graft    The patient tolerated the procedure well and was transported to the PACU in stable condition.   The patient was started on zosyn post-operatively for his right great toe gangrene. His post-operative course was uncomplicated. He had good doppler flow to his right peroneal, DP and PT arteries. His incisions were healing well. His right great toe gangrene was stable. He was ambulating adequately with assistance and pain well controlled. He was discharged home on POD 3 in good condition.    CBC    Component Value Date/Time   WBC 10.8* 04/09/2016 0219   RBC 2.57* 04/09/2016 0219   RBC 3.43* 02/28/2016 0255   HGB 7.4* 04/09/2016 0219   HCT 23.5* 04/09/2016 0219   PLT 417* 04/09/2016 0219   MCV 91.4 04/09/2016 0219   MCH 28.8 04/09/2016 0219   MCHC  31.5 04/09/2016 0219   RDW 14.2 04/09/2016 0219   LYMPHSABS 1.1 03/24/2016 0925   MONOABS 1.1* 03/24/2016 0925   EOSABS 0.2 03/24/2016 0925   BASOSABS 0.0 03/24/2016 0925    BMET    Component Value Date/Time   NA 136 04/09/2016 0219   K 4.5 04/09/2016 0219   CL 107 04/09/2016 0219   CO2 23 04/09/2016 0219   GLUCOSE 194* 04/09/2016 0219   BUN 23* 04/09/2016 0219   CREATININE 1.73* 04/09/2016 0219   CREATININE 1.57* 03/20/2016 1102   CALCIUM 7.9* 04/09/2016 0219   GFRNONAA 38* 04/09/2016 0219   GFRAA 44* 04/09/2016 0219     Discharge Instructions:   The patient is discharged to home with extensive instructions on wound care and progressive ambulation.  They are instructed not to drive or perform any heavy lifting until returning to see the physician in his office.  Discharge Instructions    Call MD for:  redness, tenderness, or signs of infection (pain, swelling, bleeding, redness, odor or green/yellow discharge around incision site)    Complete by:  As directed      Call MD for:  severe or increased pain, loss or decreased feeling  in affected limb(s)    Complete by:  As directed      Call MD for:  temperature >100.5    Complete by:  As directed      Discharge wound care:    Complete by:  As directed   Soak right foot daily for 15 mins in lukewarm dial soap soaks.  Twice a day: Apply dry gauze between right toes. Apply gauze moistened with saline to wound. Apply dry gauze on top and wrap foot daily with gauze wrap.     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 Cellulitis and abscess of foot 03/23/2016  . Diabetic infection of right foot ([place]) 03/23/2016  . Puncture wound of foot, right, complicated [id number]  . Malnutrition of moderate degree 03/23/2016  . AKI (acute kidney injury) ([place])   . Tobacco use disorder 02/28/2016  . Acute CVA (cerebrovascular accident) ([place]) 02/27/2016  . DM2 (diabetes mellitus, type 2) ([place]) 02/27/2016  . Anemia 02/27/2016  . Chronic systolic CHF (congestive heart failure) ([place]) 05/05/2015  . 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 Essential hypertension 07/16/2014  . Former smoker 04/21/2014  . 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 Dizziness 01/23/2014  . Diabetes ([place]) 01/23/2014  . Facial droop 01/23/2014  . Benign neoplasm of colon 08/25/2011  . CAD, NATIVE VESSEL 05/23/2010  . DIABETIC  RETINOPATHY 03/21/2010  . Hyperlipidemia 03/21/2010  . NEUROPATHY 03/21/2010  . HYPERTENSION 03/21/2010  . LBBB 03/21/2010  . 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 CERVICAL RADICULOPATHY 03/21/2010   Past Medical History  Diagnosis Date  . Diabetes mellitus   . Hyperlipidemia   . Allergy   . Cataract   . Colon polyps 2012    Colonoscopy  . Diverticulosis 2012    Colonoscopy   . Atrial fibrillation ([place])   . LBBB (left bundle branch block)   . Hypertension   . Stroke ([place]) 2015  . Pneumonia 2015    3 times  . Chronic kidney disease     Renal Insuffiecny       Medication List    STOP taking these medications        doxycycline 50 MG capsule  Commonly known as:  VIBRAMYCIN      TAKE these medications        aspirin 325 MG EC tablet  Take 1 tablet (325 mg total) by mouth daily.     carvedilol 6.25 MG tablet  Commonly known as:  COREG  Take 1 tablet (6.25 mg total) by mouth 2 (two) times daily with a meal.     cephALEXin 500 MG  capsule  Commonly known as:  KEFLEX  Take 1 capsule (500 mg total) by mouth 3 (three) times daily.     clopidogrel 75 MG tablet  Commonly known as:  PLAVIX  Take 1 tablet (75 mg total) by mouth daily with breakfast.     docusate sodium 100 MG capsule  Commonly known as:  COLACE  Take 1 capsule (100 mg total) by mouth 2 (two) times daily.     feeding supplement (GLUCERNA SHAKE) Liqd  Take 237 mLs  by mouth daily.     glimepiride 1 MG tablet  Commonly known as:  AMARYL  Take 1 mg by mouth 2 (two) times daily.     HYDROcodone-acetaminophen 5-325 MG tablet  Commonly known as:  NORCO/VICODIN  Take 1 tablet by mouth every 6 (six) hours as needed for moderate pain.     insulin glargine 100 UNIT/ML injection  Commonly known as:  LANTUS  Inject 0.1 mLs (10 Units total) into the skin daily.     losartan 100 MG tablet  Commonly known as:  COZAAR  Take 1 tablet (100 mg total) by mouth daily.     metFORMIN 500 MG tablet  Commonly known as:  GLUCOPHAGE  Take 500 mg by mouth 2 (two) times daily with a meal.     NIFEREX Tabs  Take 150 mg by mouth daily.     polyethylene glycol packet  Commonly known as:  MIRALAX / GLYCOLAX  Take 17 g by mouth daily.     simvastatin 40 MG tablet  Commonly known as:  ZOCOR  Take 40 mg by mouth daily.        Norco #30 No Refill  Disposition: Home  Patient's condition: is Good  Follow up: 1. Dr. Scot Dock in 2 weeks   Virgina Jock, PA-C [facility] [phone] 04/13/2016  9:41 AM  - For VQI Registry use --- Instructions: Press F2 to tab through selections.  Delete question if not applicable.   Post-op:  Wound infection: No  Graft infection: No  Transfusion: No   New Arrhythmia: No Ipsilateral amputation: No, [ ]  Minor, [ ]  BKA, [ ]  AKA Discharge patency: [x ] Primary, [ ]  Primary assisted, [ ]  Secondary, [ ]  Occluded Patency judged by: [x ] Dopper only, [ ]  Palpable graft pulse, [ ]  Palpable distal pulse, [ ]   ABI inc. > 0.15, [ ]  Duplex Discharge ABI: R 0.70, L 1.23 D/C Ambulatory Status: Ambulatory with Assistance  Complications: MI: No, [ ]  Troponin only, [ ]  EKG or Clinical CHF: No Resp failure:No, [ ]  Pneumonia, [ ]  Ventilator Chg in renal function: No, [ ]  Inc. Cr > 0.5, [ ]  Temp. Dialysis, [ ]  Permanent dialysis Stroke: No, [ ]  Minor, [ ]  Major Return to OR: No  Reason for return to OR: [ ]  Bleeding, [ ]  Infection, [ ]  Thrombosis, [ ]  Revision  Discharge medications: Statin use:  yes ASA use:  yes Plavix use:  yes Beta blocker use: no Coumadin use: no

## 2016-04-17 ENCOUNTER — Ambulatory Visit: Payer: Medicare Other | Admitting: Rehabilitation

## 2016-04-17 ENCOUNTER — Encounter: Payer: Medicare Other | Admitting: Occupational Therapy

## 2016-04-24 ENCOUNTER — Ambulatory Visit: Payer: Medicare Other | Admitting: Physical Therapy

## 2016-04-24 ENCOUNTER — Encounter: Payer: Medicare Other | Admitting: Occupational Therapy

## 2016-04-27 ENCOUNTER — Encounter (HOSPITAL_COMMUNITY): Payer: Self-pay | Admitting: *Deleted

## 2016-04-27 ENCOUNTER — Encounter: Payer: Self-pay | Admitting: Vascular Surgery

## 2016-04-27 ENCOUNTER — Other Ambulatory Visit (HOSPITAL_COMMUNITY): Payer: Self-pay | Admitting: Family

## 2016-04-27 NOTE — Progress Notes (Signed)
Pt denies chest pain or sob. Verifies that allergies, medical and surgical history are the same since his surgery on 04/07/16. Requested pt to bring in all his medications or a list of his medications in the AM for pharmacy to review. He voiced understanding. Instructed pt not to take his dinner dose of Glimiperide tonight and no oral diabetic medications in the AM. Instructed pt to check his blood sugar every 2 hours upon waking up until he leaves for the hospital.  If blood sugar is 70 or below, treat with 1/2 cup of clear juice (apple or cranberry) and recheck blood sugar 15 minutes after drinking juice. If blood sugar continues to be 70 or below, call the Short Stay department and ask to speak to a nurse. He voiced understanding.

## 2016-04-28 ENCOUNTER — Ambulatory Visit (HOSPITAL_COMMUNITY)
Admission: RE | Admit: 2016-04-28 | Discharge: 2016-04-28 | Disposition: A | Discharge status: Home / Self Care | Payer: Medicare Other | Source: Ambulatory Visit | Attending: Orthopedic Surgery | Admitting: Orthopedic Surgery

## 2016-04-28 ENCOUNTER — Ambulatory Visit (HOSPITAL_COMMUNITY): Payer: Medicare Other | Admitting: Certified Registered Nurse Anesthetist

## 2016-04-28 ENCOUNTER — Encounter (HOSPITAL_COMMUNITY): Payer: Self-pay | Admitting: *Deleted

## 2016-04-28 ENCOUNTER — Other Ambulatory Visit: Payer: Self-pay | Admitting: Cardiovascular Disease

## 2016-04-28 ENCOUNTER — Encounter (HOSPITAL_COMMUNITY)
Admission: RE | Disposition: A | Discharge status: Home / Self Care | Payer: Self-pay | Source: Ambulatory Visit | Attending: Orthopedic Surgery

## 2016-04-28 DIAGNOSIS — E785 Hyperlipidemia, unspecified: Secondary | ICD-10-CM | POA: Diagnosis not present

## 2016-04-28 DIAGNOSIS — I509 Heart failure, unspecified: Secondary | ICD-10-CM | POA: Insufficient documentation

## 2016-04-28 DIAGNOSIS — I447 Left bundle-branch block, unspecified: Secondary | ICD-10-CM

## 2016-04-28 DIAGNOSIS — Z79899 Other long term (current) drug therapy: Secondary | ICD-10-CM | POA: Insufficient documentation

## 2016-04-28 DIAGNOSIS — E1122 Type 2 diabetes mellitus with diabetic chronic kidney disease: Secondary | ICD-10-CM | POA: Diagnosis not present

## 2016-04-28 DIAGNOSIS — Z7902 Long term (current) use of antithrombotics/antiplatelets: Secondary | ICD-10-CM | POA: Insufficient documentation

## 2016-04-28 DIAGNOSIS — L97519 Non-pressure chronic ulcer of other part of right foot with unspecified severity: Secondary | ICD-10-CM | POA: Diagnosis not present

## 2016-04-28 DIAGNOSIS — I2510 Atherosclerotic heart disease of native coronary artery without angina pectoris: Secondary | ICD-10-CM | POA: Insufficient documentation

## 2016-04-28 DIAGNOSIS — I130 Hypertensive heart and chronic kidney disease with heart failure and stage 1 through stage 4 chronic kidney disease, or unspecified chronic kidney disease: Secondary | ICD-10-CM | POA: Diagnosis not present

## 2016-04-28 DIAGNOSIS — I5022 Chronic systolic (congestive) heart failure: Secondary | ICD-10-CM

## 2016-04-28 DIAGNOSIS — E1169 Type 2 diabetes mellitus with other specified complication: Secondary | ICD-10-CM | POA: Insufficient documentation

## 2016-04-28 DIAGNOSIS — Z87891 Personal history of nicotine dependence: Secondary | ICD-10-CM | POA: Diagnosis not present

## 2016-04-28 DIAGNOSIS — M869 Osteomyelitis, unspecified: Secondary | ICD-10-CM | POA: Diagnosis present

## 2016-04-28 DIAGNOSIS — Z95820 Peripheral vascular angioplasty status with implants and grafts: Secondary | ICD-10-CM | POA: Insufficient documentation

## 2016-04-28 DIAGNOSIS — E1152 Type 2 diabetes mellitus with diabetic peripheral angiopathy with gangrene: Secondary | ICD-10-CM | POA: Diagnosis not present

## 2016-04-28 DIAGNOSIS — Z7984 Long term (current) use of oral hypoglycemic drugs: Secondary | ICD-10-CM | POA: Diagnosis not present

## 2016-04-28 DIAGNOSIS — N189 Chronic kidney disease, unspecified: Secondary | ICD-10-CM | POA: Diagnosis not present

## 2016-04-28 DIAGNOSIS — I4891 Unspecified atrial fibrillation: Secondary | ICD-10-CM | POA: Diagnosis not present

## 2016-04-28 DIAGNOSIS — Z8673 Personal history of transient ischemic attack (TIA), and cerebral infarction without residual deficits: Secondary | ICD-10-CM | POA: Diagnosis not present

## 2016-04-28 DIAGNOSIS — E11621 Type 2 diabetes mellitus with foot ulcer: Secondary | ICD-10-CM | POA: Insufficient documentation

## 2016-04-28 DIAGNOSIS — I70261 Atherosclerosis of native arteries of extremities with gangrene, right leg: Secondary | ICD-10-CM

## 2016-04-28 HISTORY — PX: AMPUTATION: SHX166

## 2016-04-28 LAB — BASIC METABOLIC PANEL
Anion gap: 6 (ref 5–15)
BUN: 22 mg/dL — ABNORMAL HIGH (ref 6–20)
CO2: 24 mmol/L (ref 22–32)
Calcium: 9.1 mg/dL (ref 8.9–10.3)
Chloride: 110 mmol/L (ref 101–111)
Creatinine, Ser: 1.5 mg/dL — ABNORMAL HIGH (ref 0.61–1.24)
GFR calc Af Amer: 52 mL/min — ABNORMAL LOW (ref >60)
GFR calc non Af Amer: 45 mL/min — ABNORMAL LOW (ref >60)
Glucose, Bld: 138 mg/dL — ABNORMAL HIGH (ref 65–99)
Potassium: 4.8 mmol/L (ref 3.5–5.1)
Sodium: 140 mmol/L (ref 135–145)

## 2016-04-28 LAB — CBC
HCT: 29.6 % — ABNORMAL LOW (ref 39.0–52.0)
Hemoglobin: 9.2 g/dL — ABNORMAL LOW (ref 13.0–17.0)
MCH: 28.8 pg (ref 26.0–34.0)
MCHC: 31.1 g/dL (ref 30.0–36.0)
MCV: 92.8 fL (ref 78.0–100.0)
Platelets: 273 10*3/uL (ref 150–400)
RBC: 3.19 MIL/uL — ABNORMAL LOW (ref 4.22–5.81)
RDW: 15.8 % — ABNORMAL HIGH (ref 11.5–15.5)
WBC: 7.2 10*3/uL (ref 4.0–10.5)

## 2016-04-28 LAB — GLUCOSE, CAPILLARY
Glucose-Capillary: 125 mg/dL — ABNORMAL HIGH (ref 65–99)
Glucose-Capillary: 97 mg/dL (ref 65–99)

## 2016-04-28 SURGERY — AMPUTATION, FOOT, RAY
Anesthesia: General | Laterality: Right

## 2016-04-28 MED ORDER — FENTANYL CITRATE (PF) 100 MCG/2ML IJ SOLN
INTRAMUSCULAR | Status: AC
  Filled 2016-04-28: qty 2

## 2016-04-28 MED ORDER — FENTANYL CITRATE (PF) 100 MCG/2ML IJ SOLN
25.0000 ug | INTRAMUSCULAR | Status: DC | PRN
  Administered 2016-04-28 (×3): 50 ug via INTRAVENOUS

## 2016-04-28 MED ORDER — PROPOFOL 10 MG/ML IV BOLUS
INTRAVENOUS | Status: DC | PRN
Start: 2016-04-28 — End: 2016-04-28
  Administered 2016-04-28: 140 mg via INTRAVENOUS

## 2016-04-28 MED ORDER — LACTATED RINGERS IV SOLN
INTRAVENOUS | Status: DC
  Administered 2016-04-28: 12:00:00 via INTRAVENOUS

## 2016-04-28 MED ORDER — MIDAZOLAM HCL 2 MG/2ML IJ SOLN
INTRAMUSCULAR | Status: AC
  Filled 2016-04-28: qty 2

## 2016-04-28 MED ORDER — PROMETHAZINE HCL 25 MG/ML IJ SOLN: 6.2500 mg | INTRAMUSCULAR | Status: DC | PRN

## 2016-04-28 MED ORDER — FENTANYL CITRATE (PF) 250 MCG/5ML IJ SOLN
INTRAMUSCULAR | Status: AC
  Filled 2016-04-28: qty 5

## 2016-04-28 MED ORDER — LIDOCAINE HCL (CARDIAC) 20 MG/ML IV SOLN
INTRAVENOUS | Status: DC | PRN
  Administered 2016-04-28: 100 mg via INTRAVENOUS

## 2016-04-28 MED ORDER — FENTANYL CITRATE (PF) 100 MCG/2ML IJ SOLN
25.0000 ug | INTRAMUSCULAR | Status: DC | PRN
  Administered 2016-04-28 (×3): 25 ug via INTRAVENOUS

## 2016-04-28 MED ORDER — FENTANYL CITRATE (PF) 100 MCG/2ML IJ SOLN
INTRAMUSCULAR | Status: DC | PRN
  Administered 2016-04-28 (×2): 50 ug via INTRAVENOUS

## 2016-04-28 MED ORDER — CEFAZOLIN SODIUM-DEXTROSE 2-4 GM/100ML-% IV SOLN
2.0000 g | INTRAVENOUS | Status: AC
  Administered 2016-04-28: 2 g via INTRAVENOUS

## 2016-04-28 MED ORDER — CEFAZOLIN SODIUM-DEXTROSE 2-4 GM/100ML-% IV SOLN
INTRAVENOUS | Status: AC
  Filled 2016-04-28: qty 100

## 2016-04-28 MED ORDER — ONDANSETRON HCL 4 MG/2ML IJ SOLN
INTRAMUSCULAR | Status: DC | PRN
  Administered 2016-04-28: 4 mg via INTRAVENOUS

## 2016-04-28 MED ORDER — HYDROMORPHONE HCL 1 MG/ML IJ SOLN
INTRAMUSCULAR | Status: AC
  Filled 2016-04-28: qty 1

## 2016-04-28 MED ORDER — HYDROCODONE-ACETAMINOPHEN 5-325 MG PO TABS: 1.0000 | ORAL_TABLET | Freq: Four times a day (QID) | ORAL | 0 refills | Status: DC | PRN

## 2016-04-28 MED ORDER — CHLORHEXIDINE GLUCONATE 4 % EX LIQD
60.0000 mL | Freq: Once | CUTANEOUS | Status: DC
Start: 2016-04-28 — End: 2016-04-28

## 2016-04-28 MED ORDER — 0.9 % SODIUM CHLORIDE (POUR BTL) OPTIME
TOPICAL | Status: DC | PRN
  Administered 2016-04-28: 1000 mL

## 2016-04-28 MED ORDER — MIDAZOLAM HCL 5 MG/5ML IJ SOLN
INTRAMUSCULAR | Status: DC | PRN
  Administered 2016-04-28: 2 mg via INTRAVENOUS

## 2016-04-28 SURGICAL SUPPLY — 31 items
BLADE SAW SGTL MED 73X18.5 STR (BLADE) IMPLANT
BNDG COHESIVE 4X5 TAN STRL (GAUZE/BANDAGES/DRESSINGS) ×2 IMPLANT
BNDG GAUZE ELAST 4 BULKY (GAUZE/BANDAGES/DRESSINGS) ×2 IMPLANT
COVER SURGICAL LIGHT HANDLE (MISCELLANEOUS) ×4 IMPLANT
DRAPE U-SHAPE 47X51 STRL (DRAPES) ×4 IMPLANT
DRSG ADAPTIC 3X8 NADH LF (GAUZE/BANDAGES/DRESSINGS) ×2 IMPLANT
DRSG PAD ABDOMINAL 8X10 ST (GAUZE/BANDAGES/DRESSINGS) ×4 IMPLANT
DURAPREP 26ML APPLICATOR (WOUND CARE) ×2 IMPLANT
ELECT REM PT RETURN 9FT ADLT (ELECTROSURGICAL) ×2
ELECTRODE REM PT RTRN 9FT ADLT (ELECTROSURGICAL) ×1 IMPLANT
GAUZE SPONGE 4X4 12PLY STRL (GAUZE/BANDAGES/DRESSINGS) ×2 IMPLANT
GLOVE BIOGEL PI IND STRL 9 (GLOVE) ×1 IMPLANT
GLOVE BIOGEL PI INDICATOR 9 (GLOVE) ×1
GLOVE SURG ORTHO 9.0 STRL STRW (GLOVE) ×2 IMPLANT
GOWN STRL REUS W/ TWL LRG LVL3 (GOWN DISPOSABLE) ×1 IMPLANT
GOWN STRL REUS W/ TWL XL LVL3 (GOWN DISPOSABLE) ×2 IMPLANT
GOWN STRL REUS W/TWL LRG LVL3 (GOWN DISPOSABLE) ×1
GOWN STRL REUS W/TWL XL LVL3 (GOWN DISPOSABLE) ×2
KIT BASIN OR (CUSTOM PROCEDURE TRAY) ×2 IMPLANT
KIT PREVENA INCISION MGT 13 (CANNISTER) ×2 IMPLANT
KIT ROOM TURNOVER OR (KITS) ×2 IMPLANT
NS IRRIG 1000ML POUR BTL (IV SOLUTION) ×2 IMPLANT
PACK ORTHO EXTREMITY (CUSTOM PROCEDURE TRAY) ×2 IMPLANT
PAD ARMBOARD 7.5X6 YLW CONV (MISCELLANEOUS) ×4 IMPLANT
SPONGE LAP 18X18 X RAY DECT (DISPOSABLE) ×2 IMPLANT
STOCKINETTE IMPERVIOUS LG (DRAPES) IMPLANT
SUT ETHILON 2 0 PSLX (SUTURE) ×4 IMPLANT
TOWEL OR 17X24 6PK STRL BLUE (TOWEL DISPOSABLE) ×2 IMPLANT
TOWEL OR 17X26 10 PK STRL BLUE (TOWEL DISPOSABLE) ×2 IMPLANT
UNDERPAD 30X30 INCONTINENT (UNDERPADS AND DIAPERS) ×2 IMPLANT
WATER STERILE IRR 1000ML POUR (IV SOLUTION) IMPLANT

## 2016-04-28 NOTE — Anesthesia Preprocedure Evaluation (Addendum)
Anesthesia Evaluation  Patient identified by MRN, date of birth, ID band Patient awake and Patient confused    Reviewed: Allergy & Precautions, NPO status , Patient's Chart, lab work & pertinent test results  Airway Mallampati: II  TM Distance: >3 FB Neck ROM: Full    Dental no notable dental hx. (+) Dental Advisory Given   Pulmonary pneumonia, former smoker,    Pulmonary exam normal breath sounds clear to auscultation       Cardiovascular hypertension, + CAD, + Peripheral Vascular Disease and +CHF  Normal cardiovascular exam+ dysrhythmias  Rhythm:Regular Rate:Normal  Echo  02-29-16: Study Conclusions  - Left ventricle: The cavity size was normal. Wall thickness was   increased in a pattern of mild LVH. There was focal basal   hypertrophy. Systolic function was moderately reduced. The   estimated ejection fraction was in the range of 35% to 40%.   Doppler parameters are consistent with abnormal left ventricular   relaxation (grade 1 diastolic dysfunction). - Aortic valve: There was trivial regurgitation. - Mitral valve: There was mild regurgitation. - Left atrium: The atrium was mildly dilated. - Pericardium, extracardiac: A trivial pericardial effusion was   identified.   Neuro/Psych PSYCHIATRIC DISORDERS  Neuromuscular disease CVA    GI/Hepatic negative GI ROS, Neg liver ROS,   Endo/Other  negative endocrine ROSdiabetes  Renal/GU Renal InsufficiencyRenal disease  negative genitourinary   Musculoskeletal negative musculoskeletal ROS (+)   Abdominal   Peds negative pediatric ROS (+)  Hematology  (+) anemia ,   Anesthesia Other Findings   Reproductive/Obstetrics negative OB ROS                          Anesthesia Physical Anesthesia Plan  ASA: III  Anesthesia Plan: General   Post-op Pain Management:    Induction: Intravenous  Airway Management Planned: LMA  Additional  Equipment:   Intra-op Plan:   Post-operative Plan: Extubation in OR  Informed Consent: I have reviewed the patients History and Physical, chart, labs and discussed the procedure including the risks, benefits and alternatives for the proposed anesthesia with the patient or authorized representative who has indicated his/her understanding and acceptance.   Dental advisory given  Plan Discussed with: CRNA  Anesthesia Plan Comments:         Anesthesia Quick Evaluation

## 2016-04-28 NOTE — Progress Notes (Signed)
Orthopedic Tech Progress Note Patient Details:  [name] 1941/08/07 [id number]  Ortho Devices Type of Ortho Device: Postop shoe/boot Ortho Device/Splint Interventions: Application   [place] Pink 04/28/2016, 2:55 PM

## 2016-04-28 NOTE — Anesthesia Procedure Notes (Signed)
Procedure Name: LMA Insertion Date/Time: 04/28/2016 1:22 PM Performed by: Oletta Lamas Pre-anesthesia Checklist: Patient identified, Emergency Drugs available, Suction available and Patient being monitored Patient Re-evaluated:Patient Re-evaluated prior to inductionOxygen Delivery Method: Circle System Utilized Preoxygenation: Pre-oxygenation with 100% oxygen Intubation Type: IV induction Ventilation: Mask ventilation without difficulty LMA: LMA inserted LMA Size: 4.0 Number of attempts: 1 Airway Equipment and Method: Bite block Placement Confirmation: positive ETCO2 Tube secured with: Tape Dental Injury: Teeth and Oropharynx as per pre-operative assessment

## 2016-04-28 NOTE — Anesthesia Procedure Notes (Signed)
Performed by: Nikeya Maxim B       

## 2016-04-28 NOTE — Anesthesia Postprocedure Evaluation (Signed)
Anesthesia Post Note  Patient: [name]  Procedure(s) Performed: Procedure(s) (LRB): Right Transmetatarsal Amputation (Right)  Patient location during evaluation: PACU Anesthesia Type: General Level of consciousness: awake and alert Pain management: pain level controlled Vital Signs Assessment: post-procedure vital signs reviewed and stable Respiratory status: spontaneous breathing, nonlabored ventilation, respiratory function stable and patient connected to nasal cannula oxygen Cardiovascular status: blood pressure returned to baseline and stable Postop Assessment: no signs of nausea or vomiting Anesthetic complications: no    Last Vitals:  Vitals:   04/28/16 1540 04/28/16 1545  BP: (!) 149/68   Pulse: 72 73  Resp: 13 10  Temp:      Last Pain:  Vitals:   04/28/16 1532  TempSrc:   PainSc: 8                  Riley Papin J

## 2016-04-28 NOTE — Op Note (Signed)
04/28/2016  1:55 PM  PATIENT:  [name]    PRE-OPERATIVE DIAGNOSIS:  Osteomyelitis, Gangrene Right Foot  POST-OPERATIVE DIAGNOSIS:  Same  PROCEDURE:  Right Transmetatarsal Amputation application of Prevena wound VAC  SURGEON:  DUDA,MARCUS V, MD  PHYSICIAN ASSISTANT:None ANESTHESIA:   General  PREOPERATIVE INDICATIONS:  [name] is a  75 y.o. male with a diagnosis of Osteomyelitis, Gangrene Right Foot who failed conservative measures and elected for surgical management.    The risks benefits and alternatives were discussed with the patient preoperatively including but not limited to the risks of infection, bleeding, nerve injury, cardiopulmonary complications, the need for revision surgery, among others, and the patient was willing to proceed.  OPERATIVE IMPLANTS: Prevena wound VAC  OPERATIVE FINDINGS: Good petechial bleeding no abscess  OPERATIVE PROCEDURE: Patient brought the operating room and underwent a general anesthetic. After adequate levels anesthesia obtained patient's right lower extremity was prepped using DuraPrep draped into a sterile field a timeout was called. A fishmouth incision was made through the forefoot just proximal to the necrotic ulcers. This was carried sharply down to bone. A oscillating saw was used to perform a transmetatarsal amputation. Electrocautery was used for hemostasis wound was irrigated with normal saline. The incision was closed using 2-0 nylon. A Prevena wound VAC was applied this had a good suction fit patient was extubated taken the PACU in stable condition plan for discharge to home prescription for Vicodin for pain.

## 2016-04-28 NOTE — Transfer of Care (Signed)
Immediate Anesthesia Transfer of Care Note  Patient: [name]  Procedure(s) Performed: Procedure(s): Right Transmetatarsal Amputation (Right)  Patient Location: PACU  Anesthesia Type:General  Level of Consciousness: awake, alert , oriented and patient cooperative  Airway & Oxygen Therapy: Patient Spontanous Breathing and Patient connected to face mask oxygen  Post-op Assessment: Report given to RN and Post -op Vital signs reviewed and stable  Post vital signs: Reviewed and stable  Last Vitals:  Vitals:   04/28/16 1124  BP: (!) 176/57  Pulse: 74  Resp: 18  Temp: 36.9 C    Last Pain:  Vitals:   04/28/16 1137  TempSrc:   PainSc: 8       Patients Stated Pain Goal: 3 ([id number])  Complications: No apparent anesthesia complications

## 2016-04-28 NOTE — H&P (Signed)
[name] is an 75 y.o. male.   Chief Complaint: Peripheral vascular disease gangrenous changes right forefoot HPI: Patient is a 75 year old gentleman with peripheral vascular disease he is status post angiography studies and presents at this time for transmetatarsal amputation for the gangrenous changes of the right forefoot.  Past Medical History:  Diagnosis Date  . Allergy   . Atrial fibrillation ([place])   . Cataract   . Chronic kidney disease    Renal Insuffiecny  . Colon polyps 2012   Colonoscopy  . Diabetes mellitus    type 2  . Diverticulosis 2012   Colonoscopy   . Hyperlipidemia   . Hypertension   . LBBB (left bundle branch block)   . Pneumonia 2015   3 times  . Stroke [facility]) 2015    Past Surgical History:  Procedure Laterality Date  . CATARACT EXTRACTION Bilateral    both eyes  . COLON SURGERY     to repair intestines as child  . COLONOSCOPY  10/20/2011   Procedure: COLONOSCOPY;  Surgeon: Inda Castle, MD;  Location: [facility];  Service: Endoscopy;  Laterality: N/A;  . FEMORAL-POPLITEAL BYPASS GRAFT Right 04/07/2016   Procedure: BYPASS GRAFT FEMORAL-POPLITEAL ARTERY-RIGHT;  Surgeon: Angelia Mould, MD;  Location: [place];  Service: Vascular;  Laterality: Right;  . HOT HEMOSTASIS  10/20/2011   Procedure: HOT HEMOSTASIS (ARGON PLASMA COAGULATION/BICAP);  Surgeon: Inda Castle, MD;  Location: [facility];  Service: Endoscopy;  Laterality: N/A;  . KNEE ARTHROSCOPY Left    left  . NECK SURGERY     disectomy  . PERIPHERAL VASCULAR CATHETERIZATION Right 04/05/2016   Procedure: Lower Extremity Angiography;  Surgeon: Rosetta Posner, MD;  Location: [place] [facility];  Service: Cardiovascular;  Laterality: Right;  . PERIPHERAL VASCULAR CATHETERIZATION N/A 04/05/2016   Procedure: Abdominal Aortogram;  Surgeon: Rosetta Posner, MD;  Location: [place] [facility];  Service: Cardiovascular;  Laterality: N/A;  . VEIN HARVEST Right 04/07/2016   Procedure: VEIN HARVEST  GREATER SAPHENOUS VIEN ;  Surgeon: Angelia Mould, MD;  Location: [facility];  Service: Vascular;  Laterality: Right;    Family History  Problem Relation Age of Onset  . Diabetes Mellitus II Mother   . Heart disease Brother     before age 26  . Colon cancer Neg Hx   . Esophageal cancer Neg Hx   . Stomach cancer Neg Hx   . Anesthesia problems Neg Hx   . Hypotension Neg Hx   . Malignant hyperthermia Neg Hx   . Pseudochol deficiency Neg Hx    Social History:  reports that he has quit smoking. His smoking use included Cigarettes. He has a 5.00 pack-year smoking history. He has never used smokeless tobacco. He reports that he drinks alcohol. He reports that he does not use drugs.  Allergies:  Allergies  Allergen Reactions  . No Known Allergies     No prescriptions prior to admission.    No results found for this or any previous visit (from the past 48 hour(s)). No results found.  Review of Systems  All other systems reviewed and are negative.   There were no vitals taken for this visit. Physical Exam  On examination patient has ischemic ulcerations to the right forefoot. Assessment/Plan Assessment: Right foot gangrenous ulcers.  Plan: We'll plan for right transmetatarsal amputation. Risks and benefits were discussed including risk of the wound not healing need for higher level amputation. Patient states he understands wish to proceed at  this time.  Newt Minion, MD 04/28/2016, 7:15 AM

## 2016-04-28 NOTE — Progress Notes (Signed)
Discharge instructions regarding care for wound vac and follow up appointment reviewed with patient and wife. Instructions and a phone number for a nurse rep from prevena wound vac was also handed to the patient. No questions from patient or wife at this time regarding discharge.  Sharene Skeans, RN

## 2016-04-29 ENCOUNTER — Encounter (HOSPITAL_COMMUNITY): Payer: Self-pay

## 2016-04-29 ENCOUNTER — Emergency Department (HOSPITAL_COMMUNITY)
Admission: EM | Admit: 2016-04-29 | Discharge: 2016-04-29 | Disposition: A | Discharge status: Home / Self Care | Payer: Medicare Other | Attending: Emergency Medicine | Admitting: Emergency Medicine

## 2016-04-29 DIAGNOSIS — E1122 Type 2 diabetes mellitus with diabetic chronic kidney disease: Secondary | ICD-10-CM | POA: Diagnosis not present

## 2016-04-29 DIAGNOSIS — I130 Hypertensive heart and chronic kidney disease with heart failure and stage 1 through stage 4 chronic kidney disease, or unspecified chronic kidney disease: Secondary | ICD-10-CM | POA: Insufficient documentation

## 2016-04-29 DIAGNOSIS — Z4801 Encounter for change or removal of surgical wound dressing: Secondary | ICD-10-CM | POA: Insufficient documentation

## 2016-04-29 DIAGNOSIS — Z7902 Long term (current) use of antithrombotics/antiplatelets: Secondary | ICD-10-CM | POA: Insufficient documentation

## 2016-04-29 DIAGNOSIS — Z8673 Personal history of transient ischemic attack (TIA), and cerebral infarction without residual deficits: Secondary | ICD-10-CM | POA: Diagnosis not present

## 2016-04-29 DIAGNOSIS — Z7984 Long term (current) use of oral hypoglycemic drugs: Secondary | ICD-10-CM | POA: Diagnosis not present

## 2016-04-29 DIAGNOSIS — Z5189 Encounter for other specified aftercare: Secondary | ICD-10-CM

## 2016-04-29 DIAGNOSIS — N189 Chronic kidney disease, unspecified: Secondary | ICD-10-CM | POA: Diagnosis not present

## 2016-04-29 DIAGNOSIS — Z85038 Personal history of other malignant neoplasm of large intestine: Secondary | ICD-10-CM | POA: Diagnosis not present

## 2016-04-29 DIAGNOSIS — Z87891 Personal history of nicotine dependence: Secondary | ICD-10-CM | POA: Insufficient documentation

## 2016-04-29 DIAGNOSIS — I2510 Atherosclerotic heart disease of native coronary artery without angina pectoris: Secondary | ICD-10-CM | POA: Diagnosis not present

## 2016-04-29 DIAGNOSIS — I5022 Chronic systolic (congestive) heart failure: Secondary | ICD-10-CM | POA: Diagnosis not present

## 2016-04-29 NOTE — ED Provider Notes (Signed)
Central DEPT Provider Note   CSN: [id number] Arrival date & time: 04/29/16  [id number]  First Provider Contact:  First MD Initiated Contact with Patient 04/29/16 1910        By signing my name below, I, Hansel Feinstein, attest that this documentation has been prepared under the direction and in the presence of  Hyman Bible, PA-C. Electronically Signed: Hansel Feinstein, ED Scribe. 04/29/16. 7:18 PM.   History   Chief Complaint Chief Complaint  Patient presents with  . Wound Check    HPI [name] is a 75 y.o. male who presents to the Emergency Department for Prevena wound vac maintenance. Pt had right transmetatarsal amputation yesterday by Dr. Sharol Given d/t osteomyelitis and gangrene of the right foot and had a Prevena wound vac placed at that time. Pt returned to the ED today as his wound vac canister was full. Pt currently complains of low grade fever (Tmax 99) and chills. He reports that Vicodin is not controlling his pain completely. He denies additional complaints.  He states that he called the office of the Orthopedist and was told by Dr Lorin Mercy to come to the ED to have the wound vac pump changed.    The history is provided by the patient. No language interpreter was used.    Past Medical History:  Diagnosis Date  . Allergy   . Atrial fibrillation ([place])   . Cataract   . Chronic kidney disease    Renal Insuffiecny  . Colon polyps 2012   Colonoscopy  . Diabetes mellitus    type 2  . Diverticulosis 2012   Colonoscopy   . Hyperlipidemia   . Hypertension   . LBBB (left bundle branch block)   . Pneumonia 2015   3 times  . Stroke [facility]) 2015    Patient Active Problem List   Diagnosis Date Noted  . 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 Cellulitis and abscess of foot 03/23/2016  . Diabetic infection of right foot ([place]) 03/23/2016  . Puncture wound of foot, right, complicated [id number]  . Malnutrition of moderate degree 03/23/2016  .  AKI (acute kidney injury) ([place])   . Tobacco use disorder 02/28/2016  . Acute CVA (cerebrovascular accident) ([place]) 02/27/2016  . DM2 (diabetes mellitus, type 2) ([place]) 02/27/2016  . Anemia 02/27/2016  . Chronic systolic CHF (congestive heart failure) ([place]) 05/05/2015  . 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 Essential hypertension 07/16/2014  . Former smoker 04/21/2014  . 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 Dizziness 01/23/2014  . Diabetes ([place]) 01/23/2014  . Facial droop 01/23/2014  . Benign neoplasm of colon 08/25/2011  . CAD, NATIVE VESSEL 05/23/2010  . DIABETIC  RETINOPATHY 03/21/2010  . Hyperlipidemia 03/21/2010  . NEUROPATHY 03/21/2010  . HYPERTENSION 03/21/2010  . LBBB 03/21/2010  . 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 CERVICAL RADICULOPATHY 03/21/2010    Past Surgical History:  Procedure Laterality Date  . CATARACT EXTRACTION Bilateral    both eyes  . COLON SURGERY     to repair intestines as child  . COLONOSCOPY  10/20/2011   Procedure: COLONOSCOPY;  Surgeon: Inda Castle, MD;  Location: [facility];  Service: Endoscopy;  Laterality: N/A;  . FEMORAL-POPLITEAL BYPASS GRAFT Right 04/07/2016   Procedure: BYPASS GRAFT FEMORAL-POPLITEAL ARTERY-RIGHT;  Surgeon: Angelia Mould, MD;  Location: [place];  Service: Vascular;  Laterality: Right;  . HOT HEMOSTASIS  10/20/2011   Procedure: HOT HEMOSTASIS (ARGON PLASMA COAGULATION/BICAP);  Surgeon: Inda Castle, MD;  Location: [facility];  Service: Endoscopy;  Laterality: N/A;  . KNEE ARTHROSCOPY Left    left  . NECK SURGERY     disectomy  . PERIPHERAL VASCULAR CATHETERIZATION Right 04/05/2016   Procedure: Lower Extremity Angiography;  Surgeon: Rosetta Posner, MD;  Location: [place] [facility];  Service: Cardiovascular;  Laterality: Right;  . PERIPHERAL VASCULAR CATHETERIZATION N/A 04/05/2016   Procedure: Abdominal Aortogram;  Surgeon: Rosetta Posner, MD;  Location: [place] [facility];   Service: Cardiovascular;  Laterality: N/A;  . VEIN HARVEST Right 04/07/2016   Procedure: VEIN HARVEST GREATER SAPHENOUS VIEN ;  Surgeon: Angelia Mould, MD;  Location: [place];  Service: Vascular;  Laterality: Right;       Home Medications    Prior to Admission medications   Medication Sig Start Date End Date Taking? Authorizing Provider  carvedilol (COREG) 12.5 MG tablet Take 25 mg by mouth 2 (two) times daily with a meal.    Historical Provider, MD  clopidogrel (PLAVIX) 75 MG tablet Take 75 mg by mouth daily.    Historical Provider, MD  glimepiride (AMARYL) 1 MG tablet Take 0.5 mg by mouth 2 (two) times daily.     Historical Provider, MD  HYDROcodone-acetaminophen (NORCO) 5-325 MG tablet Take 1 tablet by mouth every 6 (six) hours as needed. 04/28/16   Newt Minion, MD  Iron Combinations (NIFEREX) TABS Take 150 mg by mouth daily. Patient taking differently: Take 150 mg by mouth 2 (two) times daily.  02/29/16   Barton Dubois, MD  losartan (COZAAR) 100 MG tablet Take 100 mg by mouth daily.    Historical Provider, MD  metFORMIN (GLUCOPHAGE) 1000 MG tablet Take 1,000 mg by mouth 2 (two) times daily with a meal.    Historical Provider, MD  montelukast (SINGULAIR) 10 MG tablet Take 10 mg by mouth at bedtime.    Historical Provider, MD  simvastatin (ZOCOR) 40 MG tablet Take 40 mg by mouth daily.     Historical Provider, MD  spironolactone (ALDACTONE) 25 MG tablet Take 12.5 mg by mouth daily.    Historical Provider, MD    Family History Family History  Problem Relation Age of Onset  . Diabetes Mellitus II Mother   . Heart disease Brother     before age 72  . Colon cancer Neg Hx   . Esophageal cancer Neg Hx   . Stomach cancer Neg Hx   . Anesthesia problems Neg Hx   . Hypotension Neg Hx   . Malignant hyperthermia Neg Hx   . Pseudochol deficiency Neg Hx     Social History Social History  Substance Use Topics  . Smoking status: Former Smoker    Packs/day: 0.25    Years: 20.00     Types: Cigarettes  . Smokeless tobacco: Never Used     Comment: Quit June 2017  . Alcohol use 0.0 oz/week     Comment: rarely     Allergies   No known allergies   Review of Systems Review of Systems  Constitutional: Positive for chills and fever.  Musculoskeletal: Positive for arthralgias (right foot).     Physical Exam Updated Vital Signs BP 110/95 (BP Location: Right Arm)   Pulse 100   Temp 99 F (37.2 C) (Oral)   Resp 16   SpO2 100%   Physical Exam  Constitutional: He appears well-developed and well-nourished.  HENT:  Head: Normocephalic.  Eyes: Conjunctivae are normal.  Cardiovascular: Normal rate.  Pulmonary/Chest: Effort normal. No respiratory distress.  Musculoskeletal: Normal range of motion.  Wound vac of the RLE in place.  No erythema, edema, or warmth of the RLE  S/p metatarsal right foot amputation  Neurological: He is alert.  Skin: Skin is warm and dry.  Psychiatric: He has a normal mood and affect. His behavior is normal.  Nursing note and vitals reviewed.    ED Treatments / Results  Labs (all labs ordered are listed, but only abnormal results are displayed) Labs Reviewed - No data to display  EKG  EKG Interpretation None       Radiology No results found.  Procedures Procedures (including critical care time)  DIAGNOSTIC STUDIES: Oxygen Saturation is 100% on RA, normal by my interpretation.    COORDINATION OF CARE: 7:14 PM Discussed treatment plan with pt at bedside which includes wound vac maintenance and pt agreed to plan.    Medications Ordered in ED Medications - No data to display   Initial Impression / Assessment and Plan / ED Course  I have reviewed the triage vital signs and the nursing notes.  Pertinent labs & imaging results that were available during my care of the patient were reviewed by me and considered in my medical decision making (see chart for details).  Clinical Course    [name] presents to the  ED for wound vac maintenance. He called Dr Lorin Mercy and was instructed to come to the ED to have the pump changed out.  Patient will be sent home with additional wound vac pump. Conservative therapies discussed and recommended. Patient advised to follow up with Ortho or with ED if worsening symptoms. Patient appears stable for discharge at this time. Return precautions discussed and outlined in discharge paperwork. Patient is agreeable to plan.     Final Clinical Impressions(s) / ED Diagnoses   Final diagnoses:  None    New Prescriptions New Prescriptions   No medications on file    I personally performed the services described in this documentation, which was scribed in my presence. The recorded information has been reviewed and is accurate.    Hyman Bible, PA-C 05/01/16 2055    Veryl Speak, MD 05/02/16 [phone]

## 2016-04-29 NOTE — ED Triage Notes (Signed)
Pt. Was sent to the ED by Dr. Lorin Mercy to have pt. 's foot wound preva cannister changed.

## 2016-04-29 NOTE — Progress Notes (Signed)
Orthopedic Tech Progress Note Patient Details:  [name] [birth year] [id number] Changed out pump on wound vac  Patient ID: [name], male   DOB: October 14, 1940, 75 y.o.   MRN: [id number]   [name] 04/29/2016, 7:10 PM

## 2016-05-01 ENCOUNTER — Encounter (HOSPITAL_COMMUNITY): Payer: Self-pay | Admitting: Orthopedic Surgery

## 2016-05-03 ENCOUNTER — Encounter: Payer: Self-pay | Admitting: Vascular Surgery

## 2016-05-03 ENCOUNTER — Encounter (INDEPENDENT_AMBULATORY_CARE_PROVIDER_SITE_OTHER): Payer: Self-pay

## 2016-05-03 ENCOUNTER — Ambulatory Visit (INDEPENDENT_AMBULATORY_CARE_PROVIDER_SITE_OTHER): Payer: Medicare Other | Admitting: Vascular Surgery

## 2016-05-03 VITALS — BP 129/72 | HR 93 | Temp 97.6°F | Resp 16 | Ht 70.0 in | Wt 161.0 lb

## 2016-05-03 DIAGNOSIS — Z48812 Encounter for surgical aftercare following surgery on the circulatory system: Secondary | ICD-10-CM

## 2016-05-03 NOTE — Progress Notes (Signed)
Patient name: Huzaifa Bujak MRN: [id number] DOB: January 04, 1941 Sex: male  REASON FOR VISIT: Follow up after right femoropopliteal bypass.  HPI: Arie Dankers is a 75 y.o. male who had presented with a gangrenous wound of the right great toe. He underwent a right common femoral artery endarterectomy and right femoral to above-knee popliteal artery bypass with a vein graft on 04/07/2016. The patient was discharged on 04/10/2016 and comes in for a follow up visit.   The patient is doing well and has undergone a forefoot amputation by Dr. Sharol Given. His only complaint is some moderate leg swelling.  He is on a statin. He is not on aspirin as he states that he does not tolerate aspirin but he is on Plavix.  Current Outpatient Prescriptions  Medication Sig Dispense Refill  . carvedilol (COREG) 12.5 MG tablet Take 25 mg by mouth 2 (two) times daily with a meal.    . clopidogrel (PLAVIX) 75 MG tablet Take 75 mg by mouth daily.    Marland Kitchen glimepiride (AMARYL) 1 MG tablet Take 0.5 mg by mouth 2 (two) times daily.     Marland Kitchen HYDROcodone-acetaminophen (NORCO) 5-325 MG tablet Take 1 tablet by mouth every 6 (six) hours as needed. 30 tablet 0  . Iron Combinations (NIFEREX) TABS Take 150 mg by mouth daily. (Patient taking differently: Take 150 mg by mouth 2 (two) times daily. ) 30 tablet 3  . losartan (COZAAR) 100 MG tablet Take 100 mg by mouth daily.    . metFORMIN (GLUCOPHAGE) 1000 MG tablet Take 1,000 mg by mouth 2 (two) times daily with a meal.    . montelukast (SINGULAIR) 10 MG tablet Take 10 mg by mouth at bedtime.    . simvastatin (ZOCOR) 40 MG tablet Take 40 mg by mouth daily.     Marland Kitchen spironolactone (ALDACTONE) 25 MG tablet Take 0.5 tablets (12.5 mg total) by mouth daily. 90 tablet 3  . spironolactone (ALDACTONE) 25 MG tablet Take 12.5 mg by mouth daily.     No current facility-administered medications for this visit.     REVIEW OF SYSTEMS:  [X]  denotes positive finding, [ ]  denotes negative finding Cardiac   Comments:  Chest pain or chest pressure:    Shortness of breath upon exertion:    Short of breath when lying flat:    Irregular heart rhythm:    Constitutional    Fever or chills:      PHYSICAL EXAM: Vitals:   05/03/16 0844  BP: 129/72  Pulse: 93  Resp: 16  Temp: 97.6 F (36.4 C)  SpO2: 100%  Weight: 161 lb (73 kg)  Height: 5\' 10"  (1.778 m)    GENERAL: The patient is a well-nourished male, in no acute distress. The vital signs are documented above. CARDIOVASCULAR: There is a regular rate and rhythm. PULMONARY: There is good air exchange bilaterally without wheezing or rales. His incisions are healing nicely. He has a palpable right popliteal pulse. As brisk Doppler signals in the posterior tibial and dorsalis pedis positions in the right foot. His right transmetatarsal AB patient site appears to be healing adequately.  MEDICAL ISSUES:  STATUS POST RIGHT FEMOROPOPLITEAL BYPASS: the patient is doing well status post right femoral endarterectomy and right femoropopliteal bypass with a vein graft. He has undergone transmetatarsal amputation by Dr. Sharol Given and this appears to be healing adequately. I have ordered ABIs and a right lower extremity bypass graft duplex in 3 months. He will get studies every  3 months with the first year.  I will see him back at that time. He noted to call sooner if he has problems.  VQI: he is on a statin. He is not on aspirin because he does not tolerate this. However he is on Plavix.  Deitra Mayo [facility] [phone]

## 2016-05-08 ENCOUNTER — Ambulatory Visit: Payer: Medicare Other | Admitting: Nurse Practitioner

## 2016-05-11 ENCOUNTER — Other Ambulatory Visit: Payer: Medicare Other

## 2016-05-22 ENCOUNTER — Encounter: Payer: Self-pay | Admitting: Occupational Therapy

## 2016-05-22 NOTE — Therapy (Signed)
[facility] [address] [place], [place], [address] Phone: [phone]   Fax:  [phone]  Patient Details  Name: [name] MRN: [id number] Date of Birth: May 12, 1941 Referring Provider:  No ref. provider found  Encounter Date: 05/22/2016  OCCUPATIONAL THERAPY DISCHARGE SUMMARY  Visits from Start of Care: 1(evaluation)  Current functional level related to goals / functional outcomes: See eval   Remaining deficits: See eval   Education / Equipment: Education not completed due to pt not returning for OT  Plan: Patient agrees to discharge.  Patient goals were not met. Patient is being discharged due to a change in medical status. (pt did not return to OT after evaluation 03/14/16). ?????        [facility] 05/22/2016, 3:31 PM  [facility] [address] [place], [place], [address] Phone: [phone]   Fax:  [place], OTR/L [facility] [address] [place], [place]  [address] [phone] phone [phone] 05/22/16 3:32 PM

## 2016-05-24 ENCOUNTER — Ambulatory Visit: Payer: Medicare Other | Admitting: Nurse Practitioner

## 2016-05-26 NOTE — Addendum Note (Signed)
Addended by: Thresa Ross C on: 05/26/2016 04:01 PM   Modules accepted: Orders

## 2016-07-06 ENCOUNTER — Ambulatory Visit (INDEPENDENT_AMBULATORY_CARE_PROVIDER_SITE_OTHER): Payer: Medicare Other | Admitting: Orthopedic Surgery

## 2016-07-06 DIAGNOSIS — E1142 Type 2 diabetes mellitus with diabetic polyneuropathy: Secondary | ICD-10-CM

## 2016-07-06 DIAGNOSIS — Z89431 Acquired absence of right foot: Secondary | ICD-10-CM

## 2016-08-03 ENCOUNTER — Ambulatory Visit (INDEPENDENT_AMBULATORY_CARE_PROVIDER_SITE_OTHER): Payer: Medicare Other | Admitting: Orthopedic Surgery

## 2016-08-03 ENCOUNTER — Encounter (INDEPENDENT_AMBULATORY_CARE_PROVIDER_SITE_OTHER): Payer: Self-pay | Admitting: Orthopedic Surgery

## 2016-08-03 VITALS — Ht 70.0 in | Wt 161.0 lb

## 2016-08-03 DIAGNOSIS — Z89431 Acquired absence of right foot: Secondary | ICD-10-CM

## 2016-08-03 NOTE — Progress Notes (Signed)
Wound Care Note   Patient: [name]           Date of Birth: 06-09-1941           MRN: [id number]             PCP: Tivis Ringer, MD Visit Date: 08/03/2016   Assessment & Plan: Visit Diagnoses:  1. S/P transmetatarsal amputation of foot, right (HCC)     Plan: Continue Vaseline or carpal butter to callused area on transmetatarsal amputation. Continue double upright brace with custom orthotic. Him has an appointment tomorrow to have modifications made to him spacer.  Follow-Up Instructions: Return in about 2 months (around 10/03/2016).  Orders:  No orders of the defined types were placed in this encounter.  No orders of the defined types were placed in this encounter.     Procedures: No notes on file   Clinical Data: No additional findings.   No images are attached to the encounter.   Subjective: Chief Complaint  Patient presents with  . Right Foot - Follow-up    Right transmetatarsal amputation 04/28/16. 3 months post op.    Patient is here for 4 week follow up right transmetatarsal amputation on 04/28/16. He is currently 3 months post op. He has skin callus built up lateral residual limb. He is wanting to know how long it will continue to be this way. He has also run out of the nitroglycerin patches and wants to know if he should continue to use. The patient has no open areas. He is not wearing custom spacer because he feels it is too large and causes him to walk with a limp.     Review of Systems  Constitutional: Negative for chills and fever.  All other systems reviewed and are negative.     Objective: Vital Signs: Ht 5\' 10"  (1.778 m)   Wt 161 lb (73 kg)   BMI 23.10 kg/m    Physical Exam: steady gait him ambulating today with a double upright brace on the right. well-healed right transmetatarsal amputation. There is quite a bit of callus buildup. This was part with a #10 blade knife. Back to viable tissue. No bleeding no drainage. No open areas. No  sign of infection.  Specialty Comments: No specialty comments available.   PMFS History: Patient Active Problem List   Diagnosis Date Noted  . S/P transmetatarsal amputation of foot, right ([place]) 08/03/2016  . 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 Cellulitis and abscess of foot 03/23/2016  . Diabetic infection of right foot ([place]) 03/23/2016  . Puncture wound of foot, right, complicated 40/98/1191  . Malnutrition of moderate degree 03/23/2016  . AKI (acute kidney injury) ([place])   . Tobacco use disorder 02/28/2016  . Acute CVA (cerebrovascular accident) ([place]) 02/27/2016  . DM2 (diabetes mellitus, type 2) ([place]) 02/27/2016  . Anemia 02/27/2016  . Chronic systolic CHF (congestive heart failure) ([place]) 05/05/2015  . 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 Essential hypertension 07/16/2014  . Former smoker 04/21/2014  . 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 Dizziness 01/23/2014  . Diabetes ([place]) 01/23/2014  . Facial droop 01/23/2014  . Benign neoplasm of colon 08/25/2011  . CAD, NATIVE VESSEL 05/23/2010  . DIABETIC  RETINOPATHY 03/21/2010  . Hyperlipidemia 03/21/2010  . NEUROPATHY 03/21/2010  . HYPERTENSION 03/21/2010  . LBBB 03/21/2010  . 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 CERVICAL RADICULOPATHY 03/21/2010   Past Medical History:  Diagnosis  Date  . Allergy   . Atrial fibrillation ([place])   . Cataract   . Chronic kidney disease    Renal Insuffiecny  . Colon polyps 2012   Colonoscopy  . Diabetes mellitus    type 2  . Diverticulosis 2012   Colonoscopy   . Hyperlipidemia   . Hypertension   . LBBB (left bundle branch block)   . Pneumonia 2015   3 times  . Stroke [facility]) 2015    Family History  Problem Relation Age of Onset  . Diabetes Mellitus II Mother   . Heart disease Brother     before age 44  . Colon cancer Neg Hx   . Esophageal cancer Neg Hx   . Stomach cancer Neg Hx   . Anesthesia  problems Neg Hx   . Hypotension Neg Hx   . Malignant hyperthermia Neg Hx   . Pseudochol deficiency Neg Hx    Past Surgical History:  Procedure Laterality Date  . AMPUTATION Right 04/28/2016   Procedure: Right Transmetatarsal Amputation;  Surgeon: Newt Minion, MD;  Location: [facility];  Service: Orthopedics;  Laterality: Right;  . CATARACT EXTRACTION Bilateral    both eyes  . COLON SURGERY     to repair intestines as child  . COLONOSCOPY  10/20/2011   Procedure: COLONOSCOPY;  Surgeon: Inda Castle, MD;  [facility];  Service: Endoscopy;  Laterality: N/A;  . FEMORAL-POPLITEAL BYPASS GRAFT Right 04/07/2016   Procedure: BYPASS GRAFT FEMORAL-POPLITEAL ARTERY-RIGHT;  Surgeon: Angelia Mould, MD;  Location: [facility];  Service: Vascular;  Laterality: Right;  . HOT HEMOSTASIS  10/20/2011   Procedure: HOT HEMOSTASIS (ARGON PLASMA COAGULATION/BICAP);  Surgeon: Inda Castle, MD;  Location: [facility];  Service: Endoscopy;  Laterality: N/A;  . KNEE ARTHROSCOPY Left    left  . NECK SURGERY     disectomy  . PERIPHERAL VASCULAR CATHETERIZATION Right 04/05/2016   Procedure: Lower Extremity Angiography;  Surgeon: Rosetta Posner, MD;  Location: [facility];  Service: Cardiovascular;  Laterality: Right;  . PERIPHERAL VASCULAR CATHETERIZATION N/A 04/05/2016   Procedure: Abdominal Aortogram;  Surgeon: Rosetta Posner, MD;  Location: [facility];  Service: Cardiovascular;  Laterality: N/A;  . VEIN HARVEST Right 04/07/2016   Procedure: VEIN HARVEST GREATER SAPHENOUS VIEN ;  Surgeon: Angelia Mould, MD;  Location: [facility];  Service: Vascular;  Laterality: Right;   Social History   Occupational History  . Retired    Social History Main Topics  . Smoking status: Former Smoker    Packs/day: 0.25    Years: 20.00    Types: Cigarettes  . Smokeless tobacco: Never Used     Comment: Quit June 2017  . Alcohol use 0.0 oz/week     Comment: rarely  . Drug use: No  . Sexual activity:  Not on file

## 2016-08-04 ENCOUNTER — Encounter: Payer: Self-pay | Admitting: Vascular Surgery

## 2016-08-09 ENCOUNTER — Ambulatory Visit (INDEPENDENT_AMBULATORY_CARE_PROVIDER_SITE_OTHER): Payer: Medicare Other | Admitting: Vascular Surgery

## 2016-08-09 ENCOUNTER — Ambulatory Visit (HOSPITAL_COMMUNITY)
Admission: RE | Admit: 2016-08-09 | Discharge: 2016-08-09 | Disposition: A | Discharge status: Home / Self Care | Payer: Medicare Other | Source: Ambulatory Visit | Attending: Vascular Surgery | Admitting: Vascular Surgery

## 2016-08-09 ENCOUNTER — Ambulatory Visit (INDEPENDENT_AMBULATORY_CARE_PROVIDER_SITE_OTHER)
Admission: RE | Admit: 2016-08-09 | Discharge: 2016-08-09 | Disposition: A | Discharge status: Home / Self Care | Payer: Medicare Other | Source: Ambulatory Visit | Attending: Vascular Surgery | Admitting: Vascular Surgery

## 2016-08-09 ENCOUNTER — Encounter: Payer: Self-pay | Admitting: Vascular Surgery

## 2016-08-09 VITALS — BP 128/57 | HR 70 | Temp 97.1°F | Resp 20 | Ht 70.0 in | Wt 172.1 lb

## 2016-08-09 DIAGNOSIS — I70261 Atherosclerosis of native arteries of extremities with gangrene, right leg: Secondary | ICD-10-CM | POA: Diagnosis not present

## 2016-08-09 DIAGNOSIS — Z48812 Encounter for surgical aftercare following surgery on the circulatory system: Secondary | ICD-10-CM

## 2016-08-09 DIAGNOSIS — Z89431 Acquired absence of right foot: Secondary | ICD-10-CM | POA: Insufficient documentation

## 2016-08-09 DIAGNOSIS — E119 Type 2 diabetes mellitus without complications: Secondary | ICD-10-CM | POA: Insufficient documentation

## 2016-08-09 NOTE — Progress Notes (Signed)
Patient name: [name] MRN: [id number] DOB: [birth year] Sex: male  REASON FOR VISIT: Follow up of right femoral to above-knee popliteal artery bypass.  HPI: Jens Siems is a 75 y.o. male who presented with a gangrenous wound on his right great toe. He underwent an arteriogram which showed superficial femoral artery and tibial artery occlusive disease with single-vessel runoff via the peroneal artery. He underwent a right femoral to above-knee popliteal artery bypass with endarterectomy of the common femoral artery on 04/07/2016. He had brisk Doppler signals in the right foot.He underwent transmetatarsal amputation by Dr. Sharol Given which appeared to be healing adequately.  Since I saw him last, he states that his claudication symptoms in the right leg have resolved. He denies any history of rest pain. He denies any symptoms in the left leg. He is not a smoker. He is on a statin. He is not on aspirin as he takes Plavix.  Past Medical History:  Diagnosis Date  . Allergy   . Atrial fibrillation ([place])   . Cataract   . Chronic kidney disease    Renal Insuffiecny  . Colon polyps 2012   Colonoscopy  . Diabetes mellitus    type 2  . Diverticulosis 2012   Colonoscopy   . Hyperlipidemia   . Hypertension   . LBBB (left bundle branch block)   . Pneumonia 2015   3 times  . Stroke [facility]) 2015    Family History  Problem Relation Age of Onset  . Diabetes Mellitus II Mother   . Heart disease Brother     before age 30  . Colon cancer Neg Hx   . Esophageal cancer Neg Hx   . Stomach cancer Neg Hx   . Anesthesia problems Neg Hx   . Hypotension Neg Hx   . Malignant hyperthermia Neg Hx   . Pseudochol deficiency Neg Hx     SOCIAL HISTORY: Social History  Substance Use Topics  . Smoking status: Former Smoker    Packs/day: 0.25    Years: 20.00    Types: Cigarettes  . Smokeless tobacco: Never Used     Comment: Quit June 2017  . Alcohol use 0.0 oz/week     Comment: rarely    Allergies    Allergen Reactions  . No Known Allergies     Current Outpatient Prescriptions  Medication Sig Dispense Refill  . carvedilol (COREG) 12.5 MG tablet Take 25 mg by mouth 2 (two) times daily with a meal.    . clopidogrel (PLAVIX) 75 MG tablet Take 75 mg by mouth daily.    Marland Kitchen glimepiride (AMARYL) 1 MG tablet Take 0.5 mg by mouth 2 (two) times daily.     Marland Kitchen HYDROcodone-acetaminophen (NORCO) 5-325 MG tablet Take 1 tablet by mouth every 6 (six) hours as needed. 30 tablet 0  . Iron Combinations (NIFEREX) TABS Take 150 mg by mouth daily. (Patient taking differently: Take 150 mg by mouth 2 (two) times daily. ) 30 tablet 3  . losartan (COZAAR) 100 MG tablet Take 100 mg by mouth daily.    . metFORMIN (GLUCOPHAGE) 1000 MG tablet Take 1,000 mg by mouth 2 (two) times daily with a meal.    . montelukast (SINGULAIR) 10 MG tablet Take 10 mg by mouth at bedtime.    . simvastatin (ZOCOR) 40 MG tablet Take 40 mg by mouth daily.     Marland Kitchen spironolactone (ALDACTONE) 25 MG tablet Take 0.5 tablets (12.5 mg total) by mouth daily. 90 tablet 3  . spironolactone (ALDACTONE)  25 MG tablet Take 12.5 mg by mouth daily.     No current facility-administered medications for this visit.     REVIEW OF SYSTEMS:  [X]  denotes positive finding, [ ]  denotes negative finding Cardiac  Comments:  Chest pain or chest pressure:    Shortness of breath upon exertion:    Short of breath when lying flat:    Irregular heart rhythm:        Vascular    Pain in calf, thigh, or hip brought on by ambulation:    Pain in feet at night that wakes you up from your sleep:     Blood clot in your veins:    Leg swelling:         Pulmonary    Oxygen at home:    Productive cough:     Wheezing:         Neurologic    Sudden weakness in arms or legs:     Sudden numbness in arms or legs:     Sudden onset of difficulty speaking or slurred speech:    Temporary loss of vision in one eye:     Problems with dizziness:         Gastrointestinal     Blood in stool:     Vomited blood:         Genitourinary    Burning when urinating:     Blood in urine:        Psychiatric    Major depression:         Hematologic    Bleeding problems:    Problems with blood clotting too easily:        Skin    Rashes or ulcers:        Constitutional    Fever or chills:      PHYSICAL EXAM: Vitals:   08/09/16 1230  BP: (!) 128/57  Pulse: 70  Resp: 20  Temp: 97.1 F (36.2 C)  TempSrc: Oral  SpO2: 100%  Weight: 172 lb 1.6 oz (78.1 kg)  Height: 5\' 10"  (1.778 m)    GENERAL: The patient is a well-nourished male, in no acute distress. The vital signs are documented above. CARDIAC: There is a regular rate and rhythm.  VASCULAR: I do not detect carotid bruits. He has palpable femoral pulses. He has a diminished but palpable right popliteal pulse. I cannot palpate pulses in the right foot. He has no significant lower extremity swelling. PULMONARY: There is good air exchange bilaterally without wheezing or rales. ABDOMEN: Soft and non-tender with normal pitched bowel sounds.  MUSCULOSKELETAL: There are no major deformities or cyanosis. NEUROLOGIC: No focal weakness or paresthesias are detected. SKIN: There are no ulcers or rashes noted. PSYCHIATRIC: The patient has a normal affect.  DATA:   LOWER EXTREMITY ARTERIAL DUPLEX: I have independently interpreted his lower extremity arterial duplex. There is biphasic flow at the proximal anastomosis and proximal graft but the graft beyond that cannot be visualized. There is monophasic flow D on the bypass graft.  ARTERIAL DOPPLER STUDY: I have independently interpreted his arterial Doppler study today.  On the right side, there are monophasic Doppler signals in the dorsalis pedis and posterior tibial positions with an ABI of 73%.  On the left side, there is a biphasic posterior tibial signal and a triphasic dorsalis pedis signal. ABI is 100% on the left with a toe pressure of 120  mmHg.  MEDICAL ISSUES:  STATUS POST RIGHT COMMON FEMORAL ARTERY ENDARTERECTOMY AND FEMORAL  TO ABOVE-KNEE POPLITEAL ARTERY BYPASS WITH VEIN: on duplex there were unable to visualize the entire graft. However there was biphasic flow in the proximal graft. In addition he has fairly brisk Doppler signals in the right foot. He has known single-vessel runoff via the peroneal artery. His ABI has improved from 49% to 73%. He is asymptomatic with no claudication or rest pain in the right foot. For this reason I do not think an arteriogram is indicated. I have ordered a follow duplex scan in 3 months and ABIs at that time. I have encouraged him to stay as active as possible. Fortunately he is not a smoker. I will see him back at that time. He knows to call sooner if he has problems.  Deitra Mayo [facility] [phone]

## 2016-08-14 NOTE — Addendum Note (Signed)
Addended by: Mena Goes on: 08/14/2016 02:08 PM   Modules accepted: Orders

## 2016-09-08 ENCOUNTER — Encounter: Payer: Self-pay | Admitting: Rehabilitation

## 2016-09-08 NOTE — Therapy (Signed)
[facility] [address] [place], [place], [address] Phone: [phone]   Fax:  [phone]  Patient Details  Name: [name] MRN: [id number] Date of Birth: [birth year] Referring Provider:  No ref. provider found  Encounter Date: 09/08/2016    PHYSICAL THERAPY DISCHARGE SUMMARY  Visits from Start of Care: 1  Current functional level related to goals / functional outcomes: Unsure as pt did not return for follow up visits   Remaining deficits:     PT Long Term Goals - 03/13/16 1128      PT LONG TERM GOAL #1   Title Pt will be independent with HEP in order to indicate decreased fall risk and improved functional mobility. (Target Date: 04/24/16)   Status New     PT LONG TERM GOAL #2   Title Pt will improve FGA score >19/30 in order to indicate decreased fall risk.     Status New     PT LONG TERM GOAL #3   Title Pt will verbalize understanding of warning signs and risk factors of CVA in order to reduce time to seeking medical attention in case of future stroke.       PT LONG TERM GOAL #4   Title Pt will ambulate >500' on varying outdoor surfaces (including grass) at mod I level in order to indicate safe community negotiation.          Education / Equipment: n/a  Plan: Patient agrees to discharge.  Patient goals were not met. Patient is being discharged due to not returning since the last visit.  ?????         Cameron Sprang, PT, MPT [facility] [address] [place], [place], [address] Phone: [phone]   Fax:  [phone] 09/08/16, 1:01 PM

## 2016-09-12 ENCOUNTER — Other Ambulatory Visit: Payer: Self-pay | Admitting: Cardiovascular Disease

## 2016-09-12 DIAGNOSIS — I10 Essential (primary) hypertension: Secondary | ICD-10-CM

## 2016-09-12 DIAGNOSIS — I5022 Chronic systolic (congestive) heart failure: Secondary | ICD-10-CM

## 2016-10-02 ENCOUNTER — Ambulatory Visit (INDEPENDENT_AMBULATORY_CARE_PROVIDER_SITE_OTHER): Payer: Medicare HMO | Admitting: Orthopedic Surgery

## 2016-10-02 ENCOUNTER — Encounter (INDEPENDENT_AMBULATORY_CARE_PROVIDER_SITE_OTHER): Payer: Self-pay | Admitting: Orthopedic Surgery

## 2016-10-02 VITALS — Ht 70.0 in | Wt 172.0 lb

## 2016-10-02 DIAGNOSIS — Z89431 Acquired absence of right foot: Secondary | ICD-10-CM | POA: Diagnosis not present

## 2016-10-02 NOTE — Progress Notes (Signed)
Office Visit Note   Patient: [name]           Date of Birth: 05-Jan-1941           MRN: [id number] Visit Date: 10/02/2016              Requested by: Prince Solian, MD [address] [place], [place] [address] PCP: Tivis Ringer, MD  Chief Complaint  Patient presents with  . Right Foot - Follow-up    Right transmetatarsal amputation 04/28/16.     HPI: Patient is 74 months s/p a right transmetatarsal amputation. He is full weight bearing and in a regular shoe. No questions or concerns today. Autumn L Forrest, RMA  patient states he still has numbness around the knee from his vascular surgery. Currently wearing a posterior AFO  Assessment & Plan: Visit Diagnoses:  1. Status post transmetatarsal amputation of foot, right ([place])     Plan: also debrided of skin and soft tissue. Final dimensions of the wound 10 x 20 mm and 3 mm deep. Recommended moisturizing lotion daily recommended thin wool compression socks.  Follow-Up Instructions: Return in about 3 months (around 12/31/2016).   Ortho Exam Patient is alert oriented no adenopathy well-dressed normal affect normal respiratory effort his foot is plantigrade. He has dry cracked skin is a Wagner grade 1 ulcer over the tip of the transmetatarsal amputation. After informed consent a 10 blade knife was used to debride the skin and soft tissue back to healthy viable tissue this was touched with silver nitrate. I'll dimensions are 10 x 20 mm and 3 mm deep. Patient is no redness no cellulitis no signs of infection.  Imaging: No results found.  Orders:  No orders of the defined types were placed in this encounter.  No orders of the defined types were placed in this encounter.    Procedures: No procedures performed  Clinical Data: No additional findings.  Subjective: Review of Systems  Objective: Vital Signs: Ht 5\' 10"  (1.778 m)   Wt 172 lb (78 kg)   BMI 24.68 kg/m   Specialty Comments:  No specialty comments  available.  PMFS History: Patient Active Problem List   Diagnosis Date Noted  . Aftercare following surgery of the circulatory system 08/09/2016  . Status post transmetatarsal amputation of foot, right ([place]) 08/03/2016  . 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 ([place]) 04/07/2016  . Cellulitis and abscess of foot 03/23/2016  . Diabetic infection of right foot ([place]) 03/23/2016  . Puncture wound of foot, right, complicated 63/09/6008  . Malnutrition of moderate degree 03/23/2016  . AKI (acute kidney injury) ([place])   . Tobacco use disorder 02/28/2016  . Acute CVA (cerebrovascular accident) ([place]) 02/27/2016  . DM2 (diabetes mellitus, type 2) ([place]) 02/27/2016  . Anemia 02/27/2016  . Chronic systolic CHF (congestive heart failure) ([place]) 05/05/2015  . 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 Essential hypertension 07/16/2014  . Former smoker 04/21/2014  . 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 Dizziness 01/23/2014  . Diabetes ([place]) 01/23/2014  . Facial droop 01/23/2014  . Benign neoplasm of colon 08/25/2011  . CAD, NATIVE VESSEL 05/23/2010  . DIABETIC  RETINOPATHY 03/21/2010  . Hyperlipidemia 03/21/2010  . NEUROPATHY 03/21/2010  . HYPERTENSION 03/21/2010  . LBBB 03/21/2010  . 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 CERVICAL RADICULOPATHY 03/21/2010   Past Medical History:  Diagnosis Date  . Allergy   . Atrial fibrillation ([place])   . Cataract   .  Chronic kidney disease    Renal Insuffiecny  . Colon polyps 2012   Colonoscopy  . Diabetes mellitus    type 2  . Diverticulosis 2012   Colonoscopy   . Hyperlipidemia   . Hypertension   . LBBB (left bundle branch block)   . Pneumonia 2015   3 times  . Stroke [facility]) 2015    Family History  Problem Relation Age of Onset  . Diabetes Mellitus II Mother   . Heart disease Brother     before age 29  . Colon cancer Neg Hx   . Esophageal cancer Neg Hx   . Stomach cancer  Neg Hx   . Anesthesia problems Neg Hx   . Hypotension Neg Hx   . Malignant hyperthermia Neg Hx   . Pseudochol deficiency Neg Hx     Past Surgical History:  Procedure Laterality Date  . AMPUTATION Right 04/28/2016   Procedure: Right Transmetatarsal Amputation;  Surgeon: Newt Minion, MD;  Location: [facility];  Service: Orthopedics;  Laterality: Right;  . CATARACT EXTRACTION Bilateral    both eyes  . COLON SURGERY     to repair intestines as child  . COLONOSCOPY  10/20/2011   Procedure: COLONOSCOPY;  Surgeon: Inda Castle, MD;  [facility];  Service: Endoscopy;  Laterality: N/A;  . FEMORAL-POPLITEAL BYPASS GRAFT Right 04/07/2016   Procedure: BYPASS GRAFT FEMORAL-POPLITEAL ARTERY-RIGHT;  Surgeon: Angelia Mould, MD;  Location: [facility];  Service: Vascular;  Laterality: Right;  . HOT HEMOSTASIS  10/20/2011   Procedure: HOT HEMOSTASIS (ARGON PLASMA COAGULATION/BICAP);  Surgeon: Inda Castle, MD;  Location: [facility];  Service: Endoscopy;  Laterality: N/A;  . KNEE ARTHROSCOPY Left    left  . NECK SURGERY     disectomy  . PERIPHERAL VASCULAR CATHETERIZATION Right 04/05/2016   Procedure: Lower Extremity Angiography;  Surgeon: Rosetta Posner, MD;  Location: [facility];  Service: Cardiovascular;  Laterality: Right;  . PERIPHERAL VASCULAR CATHETERIZATION N/A 04/05/2016   Procedure: Abdominal Aortogram;  Surgeon: Rosetta Posner, MD;  Location: [facility];  Service: Cardiovascular;  Laterality: N/A;  . VEIN HARVEST Right 04/07/2016   Procedure: VEIN HARVEST GREATER SAPHENOUS VIEN ;  Surgeon: Angelia Mould, MD;  Location: [facility];  Service: Vascular;  Laterality: Right;   Social History   Occupational History  . Retired    Social History Main Topics  . Smoking status: Former Smoker    Packs/day: 0.25    Years: 20.00    Types: Cigarettes  . Smokeless tobacco: Never Used     Comment: Quit June 2017  . Alcohol use 0.0 oz/week     Comment: rarely  . Drug use:  No  . Sexual activity: Not on file

## 2016-10-18 DIAGNOSIS — I10 Essential (primary) hypertension: Secondary | ICD-10-CM | POA: Diagnosis not present

## 2016-10-18 DIAGNOSIS — N183 Chronic kidney disease, stage 3 (moderate): Secondary | ICD-10-CM | POA: Diagnosis not present

## 2016-10-18 DIAGNOSIS — I5020 Unspecified systolic (congestive) heart failure: Secondary | ICD-10-CM | POA: Diagnosis not present

## 2016-10-18 DIAGNOSIS — Z6824 Body mass index (BMI) 24.0-24.9, adult: Secondary | ICD-10-CM | POA: Diagnosis not present

## 2016-10-18 DIAGNOSIS — D509 Iron deficiency anemia, unspecified: Secondary | ICD-10-CM | POA: Diagnosis not present

## 2016-10-18 DIAGNOSIS — E1139 Type 2 diabetes mellitus with other diabetic ophthalmic complication: Secondary | ICD-10-CM | POA: Diagnosis not present

## 2016-10-19 DIAGNOSIS — D509 Iron deficiency anemia, unspecified: Secondary | ICD-10-CM | POA: Diagnosis not present

## 2016-10-19 DIAGNOSIS — I10 Essential (primary) hypertension: Secondary | ICD-10-CM | POA: Diagnosis not present

## 2016-10-25 DIAGNOSIS — N183 Chronic kidney disease, stage 3 (moderate): Secondary | ICD-10-CM | POA: Diagnosis not present

## 2016-11-02 ENCOUNTER — Other Ambulatory Visit (HOSPITAL_COMMUNITY): Payer: Self-pay | Admitting: *Deleted

## 2016-11-03 ENCOUNTER — Ambulatory Visit (HOSPITAL_COMMUNITY)
Admission: RE | Admit: 2016-11-03 | Discharge: 2016-11-03 | Disposition: A | Discharge status: Home / Self Care | Payer: Medicare HMO | Source: Ambulatory Visit | Attending: Internal Medicine | Admitting: Internal Medicine

## 2016-11-03 DIAGNOSIS — D649 Anemia, unspecified: Secondary | ICD-10-CM | POA: Insufficient documentation

## 2016-11-03 MED ORDER — SODIUM CHLORIDE 0.9 % IV SOLN
510.0000 mg | INTRAVENOUS | Status: DC
Start: 2016-11-03 — End: 2016-11-04
  Administered 2016-11-03: 510 mg via INTRAVENOUS
  Filled 2016-11-03: qty 17

## 2016-11-03 NOTE — Discharge Instructions (Signed)
Ferumoxytol injection What is this medicine? FERUMOXYTOL is an iron complex. Iron is used to make healthy red blood cells, which carry oxygen and nutrients throughout the body. This medicine is used to treat iron deficiency anemia in people with chronic kidney disease. COMMON BRAND NAME(S): Feraheme What should I tell my health care provider before I take this medicine? They need to know if you have any of these conditions: -anemia not caused by low iron levels -high levels of iron in the blood -magnetic resonance imaging (MRI) test scheduled -an unusual or allergic reaction to iron, other medicines, foods, dyes, or preservatives -pregnant or trying to get pregnant -breast-feeding How should I use this medicine? This medicine is for injection into a vein. It is given by a health care professional in a hospital or clinic setting. Talk to your pediatrician regarding the use of this medicine in children. Special care may be needed. What if I miss a dose? It is important not to miss your dose. Call your doctor or health care professional if you are unable to keep an appointment. What may interact with this medicine? This medicine may interact with the following medications: -other iron products What should I watch for while using this medicine? Visit your doctor or healthcare professional regularly. Tell your doctor or healthcare professional if your symptoms do not start to get better or if they get worse. You may need blood work done while you are taking this medicine. You may need to follow a special diet. Talk to your doctor. Foods that contain iron include: whole grains/cereals, dried fruits, beans, or peas, leafy green vegetables, and organ meats (liver, kidney). What side effects may I notice from receiving this medicine? Side effects that you should report to your doctor or health care professional as soon as possible: -allergic reactions like skin rash, itching or hives, swelling of the  face, lips, or tongue -breathing problems -changes in blood pressure -feeling faint or lightheaded, falls -fever or chills -flushing, sweating, or hot feelings -swelling of the ankles or feet Side effects that usually do not require medical attention (report to your doctor or health care professional if they continue or are bothersome): -diarrhea -headache -nausea, vomiting -stomach pain Where should I keep my medicine? This drug is given in a hospital or clinic and will not be stored at home.  2017 Elsevier/Gold Standard (2015-10-14 12:41:49)  

## 2016-11-10 ENCOUNTER — Ambulatory Visit (HOSPITAL_COMMUNITY)
Admission: RE | Admit: 2016-11-10 | Discharge: 2016-11-10 | Disposition: A | Discharge status: Home / Self Care | Payer: Medicare HMO | Source: Ambulatory Visit | Attending: Internal Medicine | Admitting: Internal Medicine

## 2016-11-10 DIAGNOSIS — D649 Anemia, unspecified: Secondary | ICD-10-CM | POA: Insufficient documentation

## 2016-11-10 MED ORDER — SODIUM CHLORIDE 0.9 % IV SOLN
510.0000 mg | Freq: Once | INTRAVENOUS | Status: AC
  Administered 2016-11-10: 510 mg via INTRAVENOUS
  Filled 2016-11-10: qty 17

## 2016-11-13 DIAGNOSIS — E611 Iron deficiency: Secondary | ICD-10-CM | POA: Diagnosis not present

## 2016-11-13 DIAGNOSIS — D631 Anemia in chronic kidney disease: Secondary | ICD-10-CM | POA: Diagnosis not present

## 2016-11-13 DIAGNOSIS — N183 Chronic kidney disease, stage 3 (moderate): Secondary | ICD-10-CM | POA: Diagnosis not present

## 2016-11-13 DIAGNOSIS — E875 Hyperkalemia: Secondary | ICD-10-CM | POA: Diagnosis not present

## 2016-11-15 ENCOUNTER — Encounter: Payer: Self-pay | Admitting: Vascular Surgery

## 2016-11-16 ENCOUNTER — Other Ambulatory Visit: Payer: Self-pay | Admitting: Nephrology

## 2016-11-16 DIAGNOSIS — N183 Chronic kidney disease, stage 3 unspecified: Secondary | ICD-10-CM

## 2016-11-20 ENCOUNTER — Ambulatory Visit
Admission: RE | Admit: 2016-11-20 | Discharge: 2016-11-20 | Disposition: A | Discharge status: Home / Self Care | Payer: Medicare HMO | Source: Ambulatory Visit | Attending: Nephrology | Admitting: Nephrology

## 2016-11-20 DIAGNOSIS — N183 Chronic kidney disease, stage 3 unspecified: Secondary | ICD-10-CM

## 2016-11-22 ENCOUNTER — Ambulatory Visit (INDEPENDENT_AMBULATORY_CARE_PROVIDER_SITE_OTHER): Payer: Medicare HMO | Admitting: Vascular Surgery

## 2016-11-22 ENCOUNTER — Ambulatory Visit: Payer: Medicare Other | Admitting: Vascular Surgery

## 2016-11-22 ENCOUNTER — Ambulatory Visit (HOSPITAL_COMMUNITY)
Admission: RE | Admit: 2016-11-22 | Discharge: 2016-11-22 | Disposition: A | Discharge status: Home / Self Care | Payer: Medicare HMO | Source: Ambulatory Visit | Attending: Vascular Surgery | Admitting: Vascular Surgery

## 2016-11-22 VITALS — BP 128/71 | HR 84 | Temp 98.0°F | Resp 18 | Ht 70.0 in | Wt 172.0 lb

## 2016-11-22 DIAGNOSIS — Z48812 Encounter for surgical aftercare following surgery on the circulatory system: Secondary | ICD-10-CM | POA: Diagnosis not present

## 2016-11-22 DIAGNOSIS — E119 Type 2 diabetes mellitus without complications: Secondary | ICD-10-CM | POA: Diagnosis not present

## 2016-11-22 DIAGNOSIS — Z95828 Presence of other vascular implants and grafts: Secondary | ICD-10-CM | POA: Insufficient documentation

## 2016-11-22 DIAGNOSIS — Z89431 Acquired absence of right foot: Secondary | ICD-10-CM | POA: Insufficient documentation

## 2016-11-22 DIAGNOSIS — I70261 Atherosclerosis of native arteries of extremities with gangrene, right leg: Secondary | ICD-10-CM | POA: Insufficient documentation

## 2016-11-22 DIAGNOSIS — I739 Peripheral vascular disease, unspecified: Secondary | ICD-10-CM | POA: Diagnosis not present

## 2016-11-22 NOTE — Progress Notes (Signed)
Patient name: [name]   MRN: [id number]        DOB: 1940-10-15        Sex: male   REASON FOR VISIT: Follow up of right femoral to above-knee popliteal artery bypass.   HPI: [name] is a 76 y.o. male who presented with a gangrenous wound on his right great toe.He underwent a right femoral to above-knee popliteal artery bypass with endarterectomy of the common femoral artery on 04/07/2016. He has 1 vessel peroneal runoff. He had brisk Doppler signals in the right foot.He underwent transmetatarsal amputation by Dr. Sharol Given which appeared to be healing adequately.   He currently has no claudication. He denies any history of rest pain. He denies any symptoms in the left leg. He is not a smoker. He is on a statin. He is not on aspirin as he takes Plavix.       Past Medical History:  Diagnosis Date  . Allergy    . Atrial fibrillation ([place])    . Cataract    . Chronic kidney disease      Renal Insuffiecny  . Colon polyps 2012    Colonoscopy  . Diabetes mellitus      type 2  . Diverticulosis 2012    Colonoscopy   . Hyperlipidemia    . Hypertension    . LBBB (left bundle branch block)    . Pneumonia 2015    3 times  . Stroke [facility]) 2015            Family History  Problem Relation Age of Onset  . Diabetes Mellitus II Mother    . Heart disease Brother        before age 69  . Colon cancer Neg Hx    . Esophageal cancer Neg Hx    . Stomach cancer Neg Hx    . Anesthesia problems Neg Hx    . Hypotension Neg Hx    . Malignant hyperthermia Neg Hx    . Pseudochol deficiency Neg Hx        SOCIAL HISTORY:        Social History  Substance Use Topics  . Smoking status: Former Smoker      Packs/day: 0.25      Years: 20.00      Types: Cigarettes  . Smokeless tobacco: Never Used        Comment: Quit June 2017  . Alcohol use 0.0 oz/week         Comment: rarely          Allergies  Allergen Reactions  . No Known Allergies              Current Outpatient Prescriptions    Medication Sig Dispense Refill  . carvedilol (COREG) 12.5 MG tablet Take 25 mg by mouth 2 (two) times daily with a meal.      . clopidogrel (PLAVIX) 75 MG tablet Take 75 mg by mouth daily.      Marland Kitchen glimepiride (AMARYL) 1 MG tablet Take 0.5 mg by mouth 2 (two) times daily.       Marland Kitchen HYDROcodone-acetaminophen (NORCO) 5-325 MG tablet Take 1 tablet by mouth every 6 (six) hours as needed. 30 tablet 0  . Iron Combinations (NIFEREX) TABS Take 150 mg by mouth daily. (Patient taking differently: Take 150 mg by mouth 2 (two) times daily. ) 30 tablet 3  . losartan (COZAAR) 100 MG tablet Take 100 mg by mouth daily.      Marland Kitchen  metFORMIN (GLUCOPHAGE) 1000 MG tablet Take 1,000 mg by mouth 2 (two) times daily with a meal.      . montelukast (SINGULAIR) 10 MG tablet Take 10 mg by mouth at bedtime.      . simvastatin (ZOCOR) 40 MG tablet Take 40 mg by mouth daily.       Marland Kitchen spironolactone (ALDACTONE) 25 MG tablet Take 0.5 tablets (12.5 mg total) by mouth daily. 90 tablet 3  . spironolactone (ALDACTONE) 25 MG tablet Take 12.5 mg by mouth daily.        No current facility-administered medications for this visit.       REVIEW OF SYSTEMS:  [X]  denotes positive finding, [ ]  denotes negative finding Cardiac   Comments:  Chest pain or chest pressure:      Shortness of breath upon exertion:      Short of breath when lying flat:      Irregular heart rhythm:             Vascular      Pain in calf, thigh, or hip brought on by ambulation:      Pain in feet at night that wakes you up from your sleep:       Blood clot in your veins:      Leg swelling:              Pulmonary      Oxygen at home:      Productive cough:       Wheezing:              Neurologic      Sudden weakness in arms or legs:       Sudden numbness in arms or legs:       Sudden onset of difficulty speaking or slurred speech:      Temporary loss of vision in one eye:       Problems with dizziness:              Gastrointestinal      Blood in stool:        Vomited blood:              Genitourinary      Burning when urinating:       Blood in urine:             Psychiatric      Major depression:              Hematologic      Bleeding problems:      Problems with blood clotting too easily:             Skin      Rashes or ulcers:             Constitutional      Fever or chills:          PHYSICAL EXAM:  Vitals:   11/22/16 1243  BP: 128/71  Pulse: 84  Resp: 18  Temp: 98 F (36.7 C)  TempSrc: Oral  SpO2: 98%  Weight: 172 lb (78 kg)  Height: 5\' 10"  (1.778 m)     CARDIAC: There is a regular rate and rhythm.  VASCULAR: I do not detect carotid bruits. He has palpable femoral pulses.  I cannot palpate pulses in the right foot But this is warm and the transmetatarsal amputation is well-healed He has no significant lower extremity swelling. PULMONARY: There is good air exchange bilaterally without wheezing or rales.  SKIN: There are no ulcers or rashes noted.   DATA:    LOWER EXTREMITY ARTERIAL DUPLEX: I have independently interpreted his lower extremity arterial duplex. There is biphasic flow at the proximal anastomosis and proximal graft but the graft beyond that cannot be visualized. There is monophasic flow D on the bypass graft.   ARTERIAL DOPPLER STUDY: I have independently interpreted his arterial Doppler study today.   On the right side, there are monophasic Doppler signals in the dorsalis pedis and posterior tibial positions with an ABI .76   On the left side, there is a triphasic posterior tibial signal and a triphasic dorsalis pedis signal. ABI is 1.15     MEDICAL ISSUES:   STATUS POST RIGHT COMMON FEMORAL ARTERY ENDARTERECTOMY AND FEMORAL TO ABOVE-KNEE POPLITEAL ARTERY BYPASS WITH VEIN: on duplex there were unable to visualize the entire graft. However there was monophasic flow in the proximal graft. In addition he has fairly brisk Doppler signals in the right foot. He has known single-vessel runoff via the  peroneal artery. His ABI has improved from preoperatively. He is asymptomatic with no claudication or rest pain in the right foot. For this reason I do not think an arteriogram is indicated. I have ordered a follow duplex scan in 3 months and ABIs at that time. I have encouraged him to stay as active as possible. Fortunately he is not a smoker. I will see him back at that time. He knows to call sooner if he has problems.    Ruta Hinds, MD [facility] Office: [phone] Pager: [phone]

## 2016-11-28 NOTE — Addendum Note (Signed)
Addended by: Lianne Cure A on: 11/28/2016 04:52 PM   Modules accepted: Orders

## 2017-01-01 ENCOUNTER — Ambulatory Visit (INDEPENDENT_AMBULATORY_CARE_PROVIDER_SITE_OTHER): Payer: Medicare HMO | Admitting: Orthopedic Surgery

## 2017-01-01 ENCOUNTER — Encounter (INDEPENDENT_AMBULATORY_CARE_PROVIDER_SITE_OTHER): Payer: Self-pay | Admitting: Orthopedic Surgery

## 2017-01-01 DIAGNOSIS — Z89431 Acquired absence of right foot: Secondary | ICD-10-CM

## 2017-01-01 NOTE — Progress Notes (Signed)
Office Visit Note   Patient: [name]           Date of Birth: 24-Jul-1941           MRN: [id number] Visit Date: 01/01/2017              Requested by: Prince Solian, MD [address] [place], [place] [address] PCP: Tivis Ringer, MD  Chief Complaint  Patient presents with  . Right Foot - Follow-up    Right Transmet 3 month rov      HPI: The patient is a 76 year old gentleman who presents today for evaluation of his right foot. He is status post transmetatarsal amputation September of last year. He has been doing well. He is in an AFO on the right. Dates it has been rubbing on his ankle. Did have some adjustments made 2 weeks ago biotech. Incision is well-healed. No concerns today.  Assessment & Plan: Visit Diagnoses:  1. Status post transmetatarsal amputation of foot, right ([place])     Plan: Continue with protective shoe wear. He will follow-up in office as needed.  Follow-Up Instructions: Return if symptoms worsen or fail to improve.   Ortho Exam  Patient is alert, oriented, no adenopathy, well-dressed, normal affect, normal respiratory effort. Right transmetatarsal amputation well-healed. There is no hypertrophic callus. No impending skin breakdown. No drainage no erythema no sign of infection  Imaging: No results found.  Labs: Lab Results  Component Value Date   HGBA1C 7.1 (H) 03/27/2016   HGBA1C 7.4 (H) 02/28/2016   HGBA1C 9.0 (H) 01/24/2014   ESRSEDRATE 57 (H) 03/23/2016   ESRSEDRATE 27 (H) 03/20/2016   CRP 9.4 (H) 03/23/2016   CRP 4.4 (H) 03/20/2016   REPTSTATUS 03/28/2016 FINAL 03/23/2016   CULT NO GROWTH 5 DAYS 03/23/2016    Orders:  No orders of the defined types were placed in this encounter.  No orders of the defined types were placed in this encounter.    Procedures: No procedures performed  Clinical Data: No additional findings.  ROS:  All other systems negative, except as noted in the HPI. Review of Systems  Constitutional:  Negative for chills and fever.  Cardiovascular: Negative for leg swelling.  Musculoskeletal: Negative for myalgias.  Skin: Negative for color change and wound.  Neurological: Negative for weakness.    Objective: Vital Signs: There were no vitals taken for this visit.  Specialty Comments:  No specialty comments available.  PMFS History: Patient Active Problem List   Diagnosis Date Noted  . Aftercare following surgery of the circulatory system 08/09/2016  . Status post transmetatarsal amputation of foot, right ([place]) 08/03/2016  . 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 Cellulitis and abscess of foot 03/23/2016  . Diabetic infection of right foot ([place]) 03/23/2016  . Puncture wound of foot, right, complicated 41/74/0814  . Malnutrition of moderate degree 03/23/2016  . AKI (acute kidney injury) ([place])   . Tobacco use disorder 02/28/2016  . Acute CVA (cerebrovascular accident) ([place]) 02/27/2016  . DM2 (diabetes mellitus, type 2) ([place]) 02/27/2016  . Anemia 02/27/2016  . Chronic systolic CHF (congestive heart failure) ([place]) 05/05/2015  . 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 Essential hypertension 07/16/2014  . Former smoker 04/21/2014  . 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 Dizziness 01/23/2014  . Diabetes ([place]) 01/23/2014  . Facial droop 01/23/2014  . Benign neoplasm of colon 08/25/2011  . CAD, NATIVE VESSEL 05/23/2010  . DIABETIC  RETINOPATHY 03/21/2010  . Hyperlipidemia 03/21/2010  . NEUROPATHY 03/21/2010  . HYPERTENSION 03/21/2010  . LBBB 03/21/2010  . 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 CERVICAL RADICULOPATHY 03/21/2010   Past Medical History:  Diagnosis Date  . Allergy   . Atrial fibrillation ([place])   . Cataract   . Chronic kidney disease    Renal Insuffiecny  . Colon polyps 2012   Colonoscopy  . Diabetes mellitus    type 2  . Diverticulosis 2012   Colonoscopy   .  Hyperlipidemia   . Hypertension   . LBBB (left bundle branch block)   . Pneumonia 2015   3 times  . Stroke [facility]) 2015    Family History  Problem Relation Age of Onset  . Diabetes Mellitus II Mother   . Heart disease Brother     before age 75  . Colon cancer Neg Hx   . Esophageal cancer Neg Hx   . Stomach cancer Neg Hx   . Anesthesia problems Neg Hx   . Hypotension Neg Hx   . Malignant hyperthermia Neg Hx   . Pseudochol deficiency Neg Hx     Past Surgical History:  Procedure Laterality Date  . AMPUTATION Right 04/28/2016   Procedure: Right Transmetatarsal Amputation;  Surgeon: Newt Minion, MD;  Location: [facility];  Service: Orthopedics;  Laterality: Right;  . CATARACT EXTRACTION Bilateral    both eyes  . COLON SURGERY     to repair intestines as child  . COLONOSCOPY  10/20/2011   Procedure: COLONOSCOPY;  Surgeon: Inda Castle, MD;  Location: [facility];  Service: Endoscopy;  Laterality: N/A;  . FEMORAL-POPLITEAL BYPASS GRAFT Right 04/07/2016   Procedure: BYPASS GRAFT FEMORAL-POPLITEAL ARTERY-RIGHT;  Surgeon: Angelia Mould, MD;  Location: [facility];  Service: Vascular;  Laterality: Right;  . HOT HEMOSTASIS  10/20/2011   Procedure: HOT HEMOSTASIS (ARGON PLASMA COAGULATION/BICAP);  Surgeon: Inda Castle, MD;  Location: [facility];  Service: Endoscopy;  Laterality: N/A;  . KNEE ARTHROSCOPY Left    left  . NECK SURGERY     disectomy  . PERIPHERAL VASCULAR CATHETERIZATION Right 04/05/2016   Procedure: Lower Extremity Angiography;  Surgeon: Rosetta Posner, MD;  Location: [facility];  Service: Cardiovascular;  Laterality: Right;  . PERIPHERAL VASCULAR CATHETERIZATION N/A 04/05/2016   Procedure: Abdominal Aortogram;  Surgeon: Rosetta Posner, MD;  Location: [facility];  Service: Cardiovascular;  Laterality: N/A;  . VEIN HARVEST Right 04/07/2016   Procedure: VEIN HARVEST GREATER SAPHENOUS VIEN ;  Surgeon: Angelia Mould, MD;  Location: [facility];  Service: Vascular;   Laterality: Right;   Social History   Occupational History  . Retired    Social History Main Topics  . Smoking status: Former Smoker    Packs/day: 0.25    Years: 20.00    Types: Cigarettes  . Smokeless tobacco: Never Used     Comment: Quit June 2017  . Alcohol use 0.0 oz/week     Comment: rarely  . Drug use: No  . Sexual activity: Not on file

## 2017-02-21 ENCOUNTER — Encounter (HOSPITAL_COMMUNITY): Payer: Medicare HMO

## 2017-02-21 ENCOUNTER — Ambulatory Visit: Payer: Medicare HMO | Admitting: Vascular Surgery

## 2017-02-21 ENCOUNTER — Other Ambulatory Visit (HOSPITAL_COMMUNITY): Payer: Medicare HMO

## 2017-02-21 DIAGNOSIS — Z6824 Body mass index (BMI) 24.0-24.9, adult: Secondary | ICD-10-CM | POA: Diagnosis not present

## 2017-02-21 DIAGNOSIS — Z1389 Encounter for screening for other disorder: Secondary | ICD-10-CM | POA: Diagnosis not present

## 2017-02-21 DIAGNOSIS — I5020 Unspecified systolic (congestive) heart failure: Secondary | ICD-10-CM | POA: Diagnosis not present

## 2017-02-21 DIAGNOSIS — N183 Chronic kidney disease, stage 3 (moderate): Secondary | ICD-10-CM | POA: Diagnosis not present

## 2017-02-21 DIAGNOSIS — I739 Peripheral vascular disease, unspecified: Secondary | ICD-10-CM | POA: Diagnosis not present

## 2017-02-21 DIAGNOSIS — Z89431 Acquired absence of right foot: Secondary | ICD-10-CM | POA: Diagnosis not present

## 2017-02-21 DIAGNOSIS — E1129 Type 2 diabetes mellitus with other diabetic kidney complication: Secondary | ICD-10-CM | POA: Diagnosis not present

## 2017-02-21 DIAGNOSIS — D509 Iron deficiency anemia, unspecified: Secondary | ICD-10-CM | POA: Diagnosis not present

## 2017-02-28 ENCOUNTER — Ambulatory Visit: Payer: Medicare HMO | Admitting: Vascular Surgery

## 2017-02-28 ENCOUNTER — Other Ambulatory Visit (HOSPITAL_COMMUNITY): Payer: Medicare HMO

## 2017-02-28 ENCOUNTER — Encounter (HOSPITAL_COMMUNITY): Payer: Medicare HMO

## 2017-03-15 DIAGNOSIS — M1712 Unilateral primary osteoarthritis, left knee: Secondary | ICD-10-CM | POA: Diagnosis not present

## 2017-04-02 IMAGING — MR MR FOOT*R* W/O CM
4 of 6 series · 19 of 40 positions shown · non-contrast
Comparison: None.

CLINICAL DATA: Stepped on roofing tack in the yard in [place].

EXAM:
MRI OF THE RIGHT FOREFOOT WITHOUT CONTRAST
TECHNIQUE: Multiplanar, multisequence MR imaging was performed. No intravenous
contrast was administered.

[Series 8: T1 · sagittal · 3.0mm · 0.33mm/px · 5 of 20 slices shown (1 of 3)]
[im 1/20]
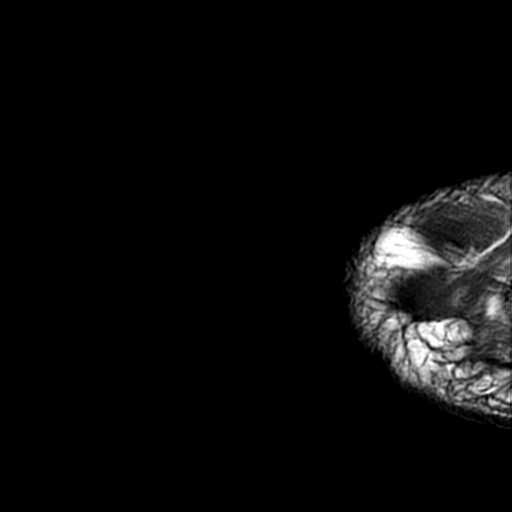
[im 4/20]
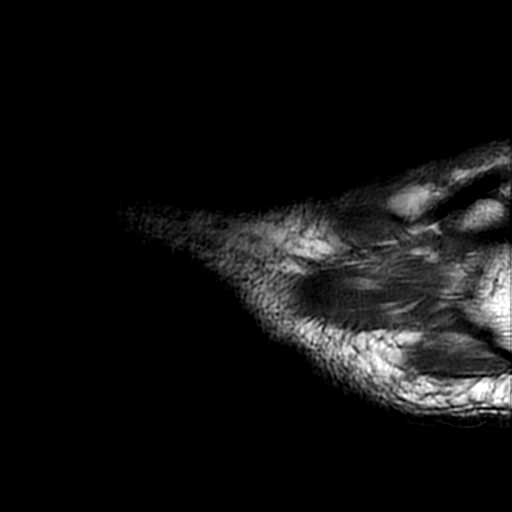
[im 8/20]
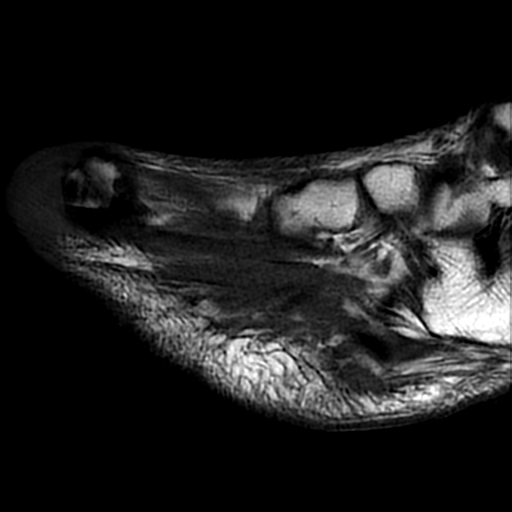
[im 12/20]
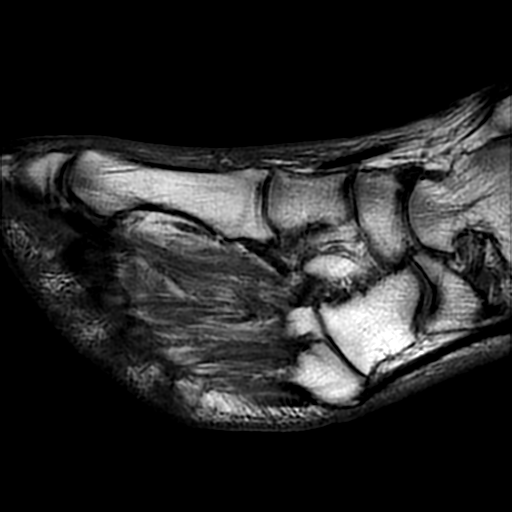
[im 20/20]
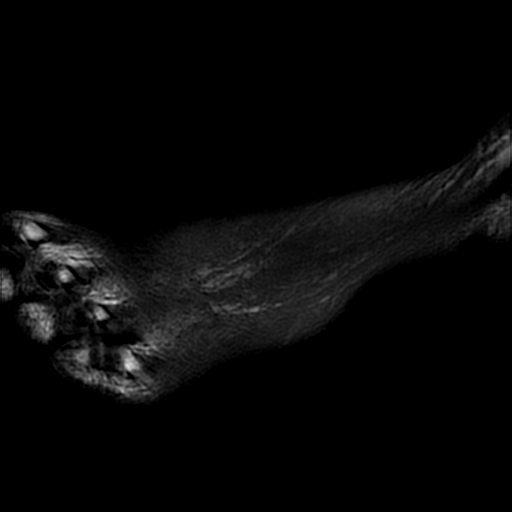

[Series 9: T2 fat-sat · coronal · 3.0mm · 0.27mm/px · 8 of 38 slices shown]
[im 1/38]
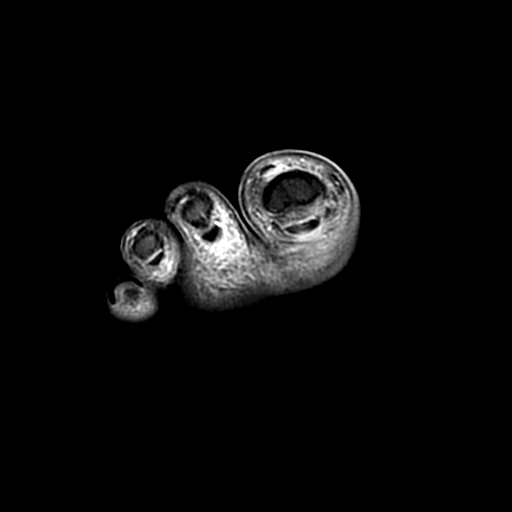
[im 5/38]
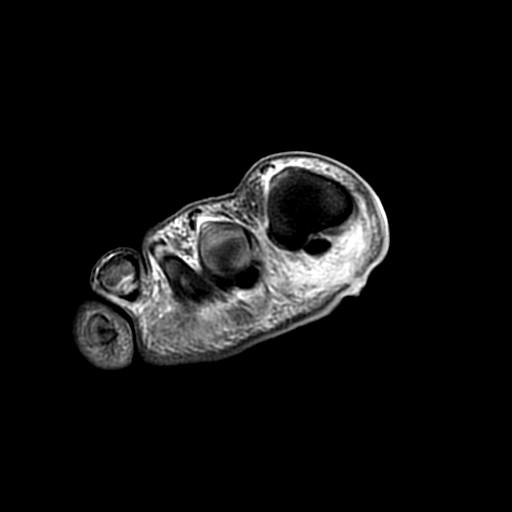
[im 13/38]
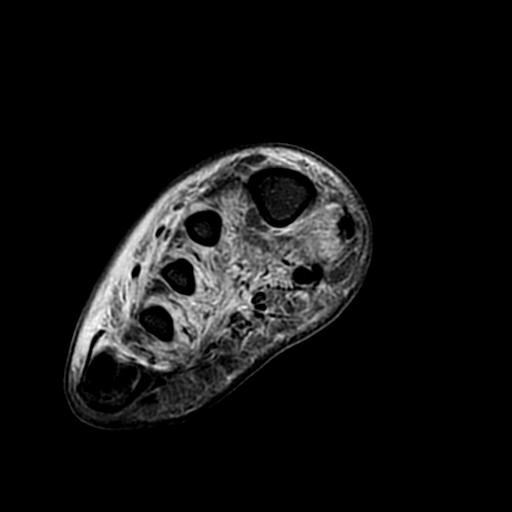
[im 17/38]
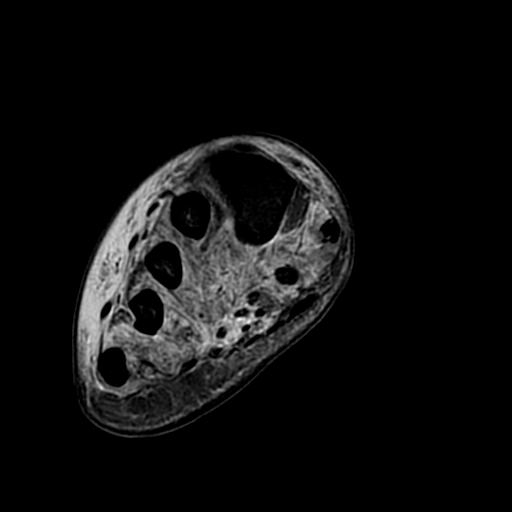
[im 21/38]
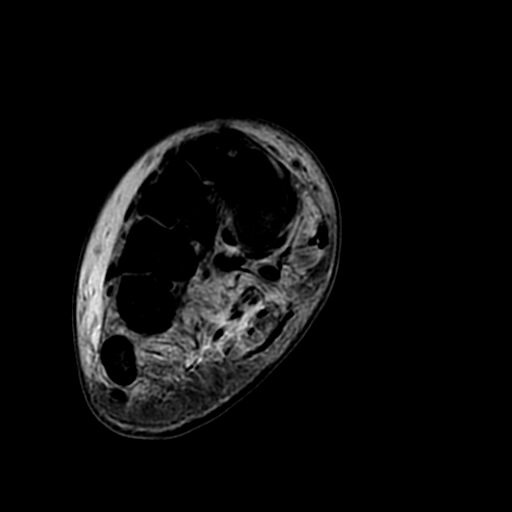
[im 25/38]
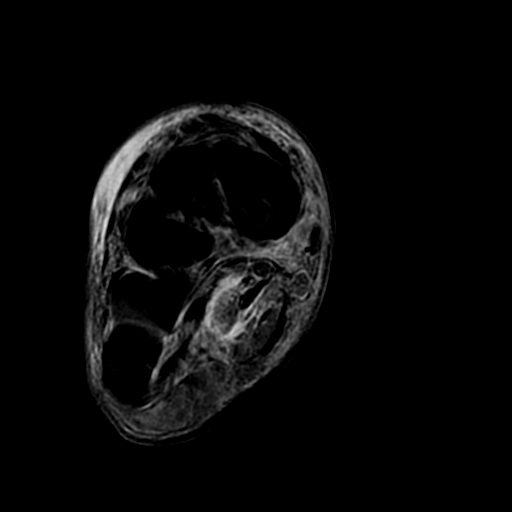
[im 33/38]
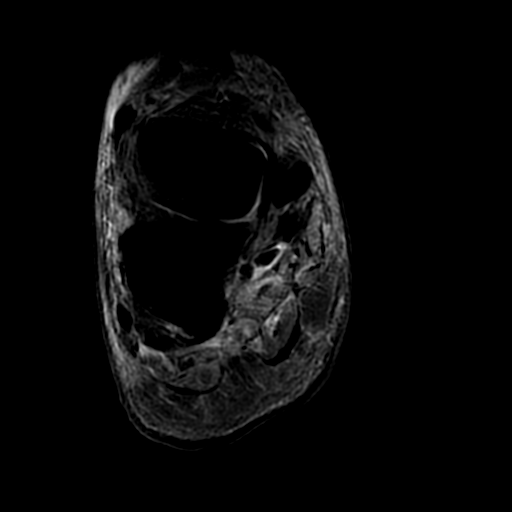
[im 38/38]
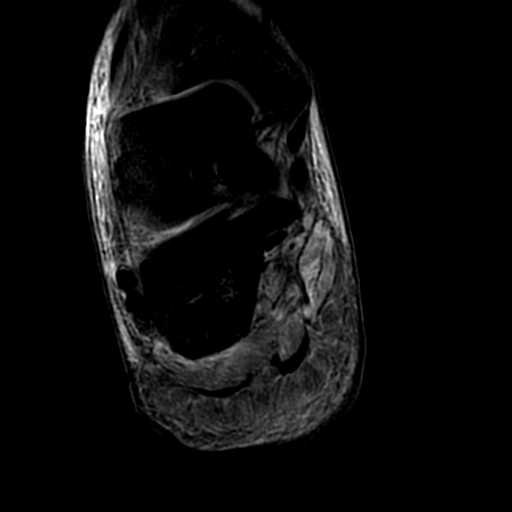

[Series 10: T1 · coronal · 3.0mm · 0.27mm/px · 3 of 38 slices shown (2 of 3)]
[im 5/38]
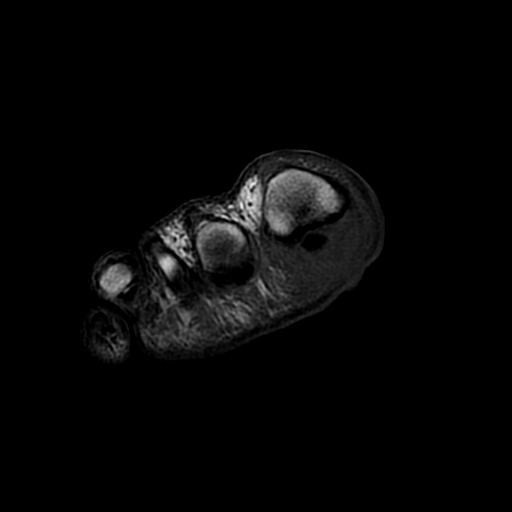
[im 21/38]
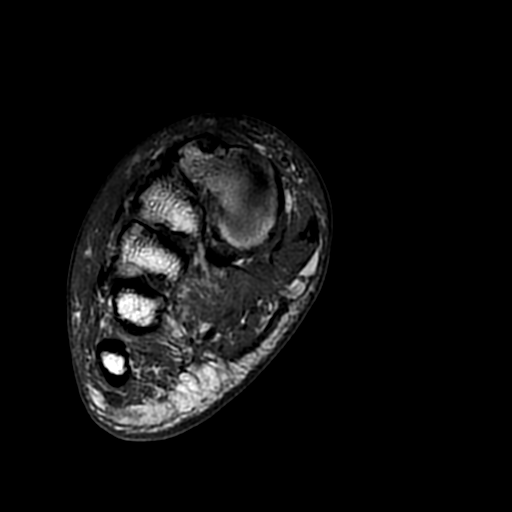
[im 33/38]
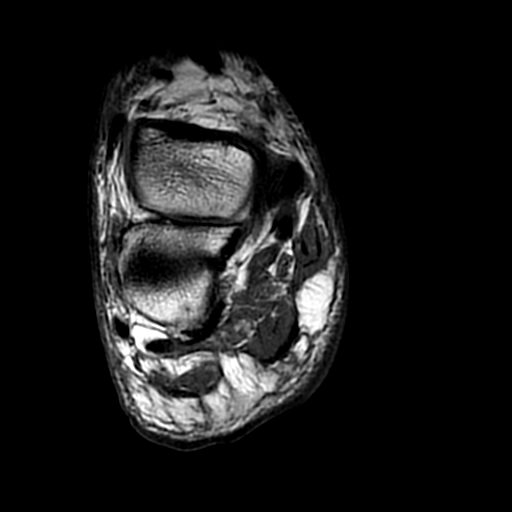

[Series 11: T1 · sagittal · 3.0mm · 0.33mm/px · 3 of 20 slices shown (3 of 3)]
[im 1/20]
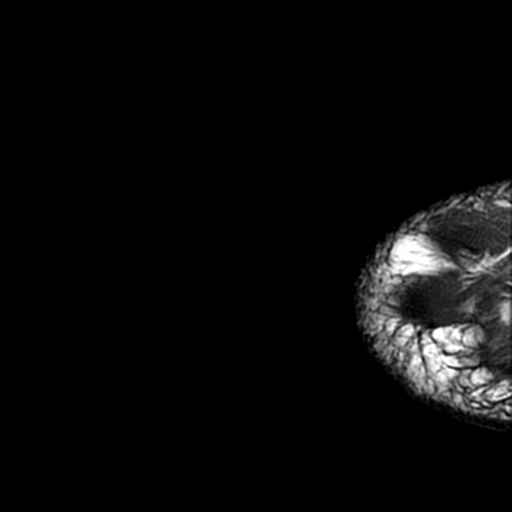
[im 10/20]
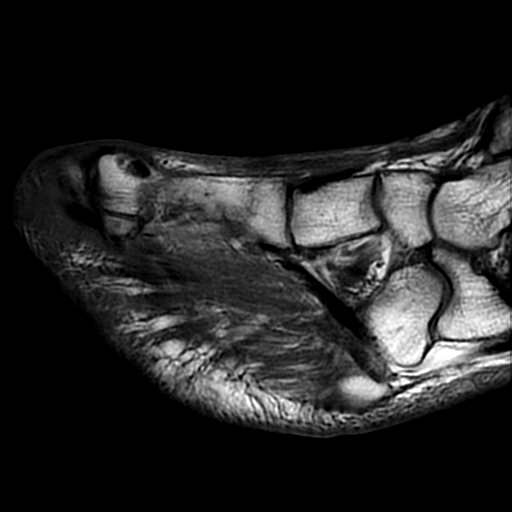
[im 20/20]
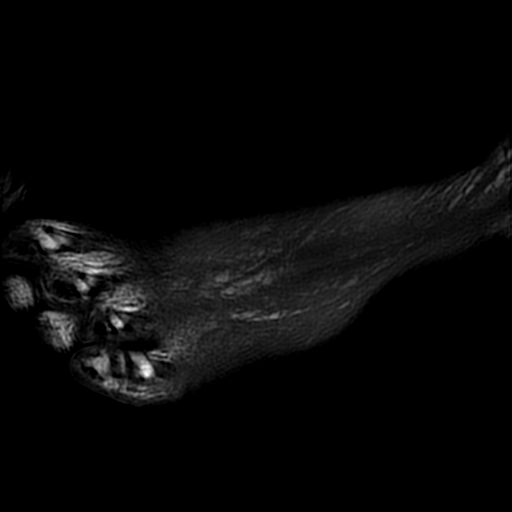

[19 of 40 positions shown; findings below may reference images not displayed]

FINDINGS: Bones/Joint/Cartilage

Mild osteoarthritis of the talonavicular joint with subchondral
reactive marrow changes. Otherwise no marrow signal abnormality. No
fracture or dislocation. Normal alignment. No joint effusion.

Collaterals

Collateral ligaments are intact.

Tendons
Flexor, peroneal and extensor compartment tendons are intact. Small
amount of fluid in the flexor hallucis longus tendon sheath.

Muscles

Mild edema throughout the plantar musculature likely neurogenic.

Soft tissue
Soft tissue puncture wound along the plantar aspect at the level of
the first MTP joint with severe soft tissue edema within the soft
tissues deep to the puncture wound extending down to the level of
the joint most consistent with severe cellulitis. No drainable fluid
collection.

No hematoma.
IMPRESSION: 1. No osteomyelitis of the right forefoot. Severe cellulitis along
the plantar aspect of the first MTP joint.
2. Mild tenosynovitis of the flexor hallucis longus.

## 2017-07-16 DIAGNOSIS — E1129 Type 2 diabetes mellitus with other diabetic kidney complication: Secondary | ICD-10-CM | POA: Diagnosis not present

## 2017-07-16 DIAGNOSIS — Z23 Encounter for immunization: Secondary | ICD-10-CM | POA: Diagnosis not present

## 2017-07-16 DIAGNOSIS — Z6824 Body mass index (BMI) 24.0-24.9, adult: Secondary | ICD-10-CM | POA: Diagnosis not present

## 2017-07-16 DIAGNOSIS — N183 Chronic kidney disease, stage 3 (moderate): Secondary | ICD-10-CM | POA: Diagnosis not present

## 2017-07-16 DIAGNOSIS — J449 Chronic obstructive pulmonary disease, unspecified: Secondary | ICD-10-CM | POA: Diagnosis not present

## 2017-07-16 DIAGNOSIS — Z720 Tobacco use: Secondary | ICD-10-CM | POA: Diagnosis not present

## 2017-07-16 DIAGNOSIS — I7389 Other specified peripheral vascular diseases: Secondary | ICD-10-CM | POA: Diagnosis not present

## 2017-07-16 DIAGNOSIS — I10 Essential (primary) hypertension: Secondary | ICD-10-CM | POA: Diagnosis not present

## 2017-07-16 DIAGNOSIS — D509 Iron deficiency anemia, unspecified: Secondary | ICD-10-CM | POA: Diagnosis not present

## 2017-09-20 DIAGNOSIS — N183 Chronic kidney disease, stage 3 (moderate): Secondary | ICD-10-CM | POA: Diagnosis not present

## 2017-09-20 DIAGNOSIS — Z125 Encounter for screening for malignant neoplasm of prostate: Secondary | ICD-10-CM | POA: Diagnosis not present

## 2017-09-20 DIAGNOSIS — R82998 Other abnormal findings in urine: Secondary | ICD-10-CM | POA: Diagnosis not present

## 2017-09-20 DIAGNOSIS — E7849 Other hyperlipidemia: Secondary | ICD-10-CM | POA: Diagnosis not present

## 2017-09-20 DIAGNOSIS — E1139 Type 2 diabetes mellitus with other diabetic ophthalmic complication: Secondary | ICD-10-CM | POA: Diagnosis not present

## 2017-09-27 DIAGNOSIS — N183 Chronic kidney disease, stage 3 (moderate): Secondary | ICD-10-CM | POA: Diagnosis not present

## 2017-09-27 DIAGNOSIS — E1129 Type 2 diabetes mellitus with other diabetic kidney complication: Secondary | ICD-10-CM | POA: Diagnosis not present

## 2017-09-27 DIAGNOSIS — E11311 Type 2 diabetes mellitus with unspecified diabetic retinopathy with macular edema: Secondary | ICD-10-CM | POA: Diagnosis not present

## 2017-09-27 DIAGNOSIS — J449 Chronic obstructive pulmonary disease, unspecified: Secondary | ICD-10-CM | POA: Diagnosis not present

## 2017-09-27 DIAGNOSIS — Z89431 Acquired absence of right foot: Secondary | ICD-10-CM | POA: Diagnosis not present

## 2017-09-27 DIAGNOSIS — I5020 Unspecified systolic (congestive) heart failure: Secondary | ICD-10-CM | POA: Diagnosis not present

## 2017-09-27 DIAGNOSIS — I69959 Hemiplegia and hemiparesis following unspecified cerebrovascular disease affecting unspecified side: Secondary | ICD-10-CM | POA: Diagnosis not present

## 2017-09-27 DIAGNOSIS — I2510 Atherosclerotic heart disease of native coronary artery without angina pectoris: Secondary | ICD-10-CM | POA: Diagnosis not present

## 2017-09-27 DIAGNOSIS — I7389 Other specified peripheral vascular diseases: Secondary | ICD-10-CM | POA: Diagnosis not present

## 2017-09-27 DIAGNOSIS — Z0000 Encounter for general adult medical examination without abnormal findings: Secondary | ICD-10-CM | POA: Diagnosis not present

## 2017-10-04 DIAGNOSIS — Z1212 Encounter for screening for malignant neoplasm of rectum: Secondary | ICD-10-CM | POA: Diagnosis not present

## 2017-10-29 DIAGNOSIS — H6123 Impacted cerumen, bilateral: Secondary | ICD-10-CM | POA: Diagnosis not present

## 2017-10-29 DIAGNOSIS — Z6824 Body mass index (BMI) 24.0-24.9, adult: Secondary | ICD-10-CM | POA: Diagnosis not present

## 2017-10-29 DIAGNOSIS — H9193 Unspecified hearing loss, bilateral: Secondary | ICD-10-CM | POA: Diagnosis not present

## 2017-11-01 ENCOUNTER — Encounter: Payer: Self-pay | Admitting: Internal Medicine

## 2017-12-10 DIAGNOSIS — M5412 Radiculopathy, cervical region: Secondary | ICD-10-CM | POA: Diagnosis not present

## 2017-12-10 DIAGNOSIS — I8310 Varicose veins of unspecified lower extremity with inflammation: Secondary | ICD-10-CM | POA: Diagnosis not present

## 2017-12-10 DIAGNOSIS — Z6824 Body mass index (BMI) 24.0-24.9, adult: Secondary | ICD-10-CM | POA: Diagnosis not present

## 2018-01-24 DIAGNOSIS — I10 Essential (primary) hypertension: Secondary | ICD-10-CM | POA: Diagnosis not present

## 2018-01-24 DIAGNOSIS — E1129 Type 2 diabetes mellitus with other diabetic kidney complication: Secondary | ICD-10-CM | POA: Diagnosis not present

## 2018-01-24 DIAGNOSIS — I8310 Varicose veins of unspecified lower extremity with inflammation: Secondary | ICD-10-CM | POA: Diagnosis not present

## 2018-01-24 DIAGNOSIS — N183 Chronic kidney disease, stage 3 (moderate): Secondary | ICD-10-CM | POA: Diagnosis not present

## 2018-01-24 DIAGNOSIS — Z1389 Encounter for screening for other disorder: Secondary | ICD-10-CM | POA: Diagnosis not present

## 2018-01-24 DIAGNOSIS — J449 Chronic obstructive pulmonary disease, unspecified: Secondary | ICD-10-CM | POA: Diagnosis not present

## 2018-01-24 DIAGNOSIS — Z6823 Body mass index (BMI) 23.0-23.9, adult: Secondary | ICD-10-CM | POA: Diagnosis not present

## 2018-02-05 DIAGNOSIS — L401 Generalized pustular psoriasis: Secondary | ICD-10-CM | POA: Diagnosis not present

## 2018-03-27 DIAGNOSIS — I10 Essential (primary) hypertension: Secondary | ICD-10-CM | POA: Diagnosis not present

## 2018-03-27 DIAGNOSIS — E1129 Type 2 diabetes mellitus with other diabetic kidney complication: Secondary | ICD-10-CM | POA: Diagnosis not present

## 2018-03-27 DIAGNOSIS — R5383 Other fatigue: Secondary | ICD-10-CM | POA: Diagnosis not present

## 2018-03-27 DIAGNOSIS — N183 Chronic kidney disease, stage 3 (moderate): Secondary | ICD-10-CM | POA: Diagnosis not present

## 2018-03-27 DIAGNOSIS — T63444A Toxic effect of venom of bees, undetermined, initial encounter: Secondary | ICD-10-CM | POA: Diagnosis not present

## 2018-03-27 MED FILL — TRIAMCINOLONE 0.1% CREAM: 0.1 | 7 days supply | Qty: 15 | Fill #0

## 2018-03-27 MED FILL — predniSONE 10 MG (21) TBPK: 10 | 6 days supply | Qty: 21 | Fill #0

## 2018-04-02 DIAGNOSIS — I10 Essential (primary) hypertension: Secondary | ICD-10-CM | POA: Diagnosis not present

## 2018-04-02 DIAGNOSIS — N183 Chronic kidney disease, stage 3 (moderate): Secondary | ICD-10-CM | POA: Diagnosis not present

## 2018-04-02 DIAGNOSIS — Z6823 Body mass index (BMI) 23.0-23.9, adult: Secondary | ICD-10-CM | POA: Diagnosis not present

## 2018-04-02 DIAGNOSIS — T63444A Toxic effect of venom of bees, undetermined, initial encounter: Secondary | ICD-10-CM | POA: Diagnosis not present

## 2018-04-02 DIAGNOSIS — E1129 Type 2 diabetes mellitus with other diabetic kidney complication: Secondary | ICD-10-CM | POA: Diagnosis not present

## 2018-04-03 DIAGNOSIS — R799 Abnormal finding of blood chemistry, unspecified: Secondary | ICD-10-CM | POA: Diagnosis not present

## 2018-04-03 DIAGNOSIS — N183 Chronic kidney disease, stage 3 (moderate): Secondary | ICD-10-CM | POA: Diagnosis not present

## 2018-04-03 DIAGNOSIS — E1129 Type 2 diabetes mellitus with other diabetic kidney complication: Secondary | ICD-10-CM | POA: Diagnosis not present

## 2018-04-11 ENCOUNTER — Other Ambulatory Visit (INDEPENDENT_AMBULATORY_CARE_PROVIDER_SITE_OTHER): Payer: Self-pay

## 2018-04-11 ENCOUNTER — Telehealth (INDEPENDENT_AMBULATORY_CARE_PROVIDER_SITE_OTHER): Payer: Self-pay | Admitting: Orthopedic Surgery

## 2018-04-11 DIAGNOSIS — I10 Essential (primary) hypertension: Secondary | ICD-10-CM | POA: Diagnosis not present

## 2018-04-11 NOTE — Telephone Encounter (Signed)
Called pt and he advised that he will pick up the rx tomorrow this was written for a double upright brace with extra depth shoe custom orthotic and carbon fiber plate for [facility] and is at the front desk for pick up.
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
## 2018-04-18 ENCOUNTER — Telehealth (INDEPENDENT_AMBULATORY_CARE_PROVIDER_SITE_OTHER): Payer: Self-pay | Admitting: Orthopedic Surgery

## 2018-04-18 NOTE — Telephone Encounter (Signed)
Can you please write rx? Thanks.

## 2018-04-18 NOTE — Telephone Encounter (Signed)
Patient called advised [facility] need a Rx for right ankle brace and right prosthetic toes. The number to contact patient is [phone]

## 2018-04-18 NOTE — Telephone Encounter (Signed)
rx written

## 2018-04-18 NOTE — Telephone Encounter (Signed)
rx put up front for pick up. I called to advise.

## 2018-04-22 IMAGING — US US RENAL
1 series · 14 of 25 positions shown · non-contrast
Comparison: None.

CLINICAL DATA: Chronic kidney disease stage 3

EXAM:
RENAL / URINARY TRACT ULTRASOUND COMPLETE

[Series 1: us renal · 0.20mm/px · 14 of 32 slices shown]
[im 1/32]
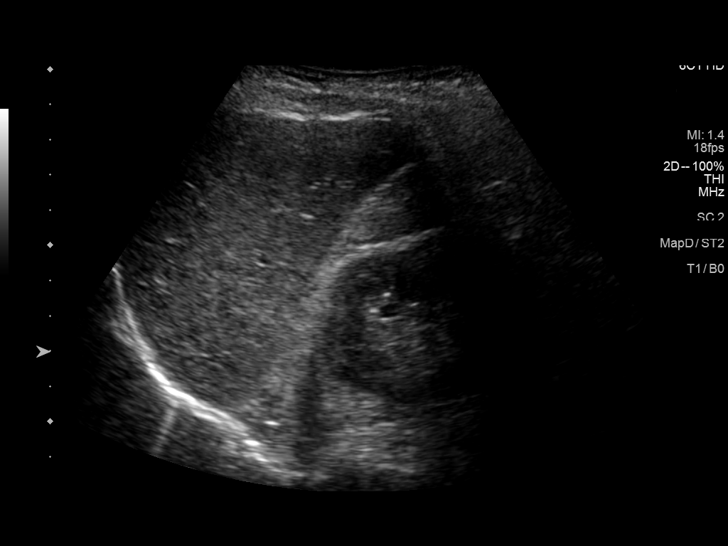
[im 3/32]
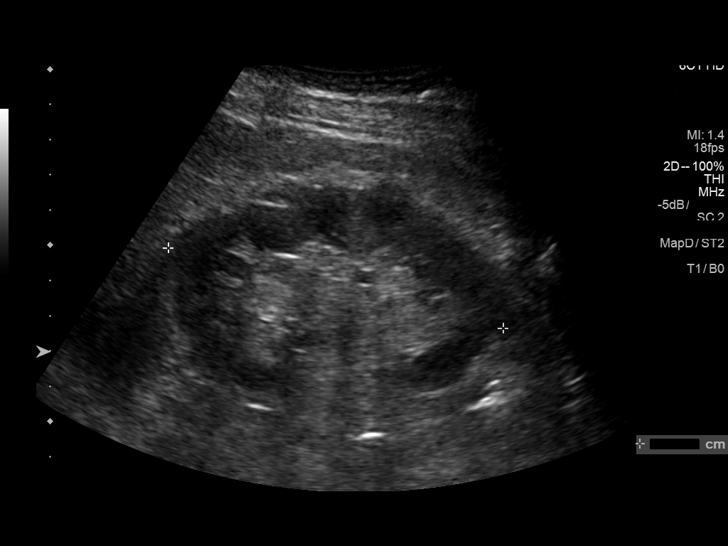
[im 6/32]
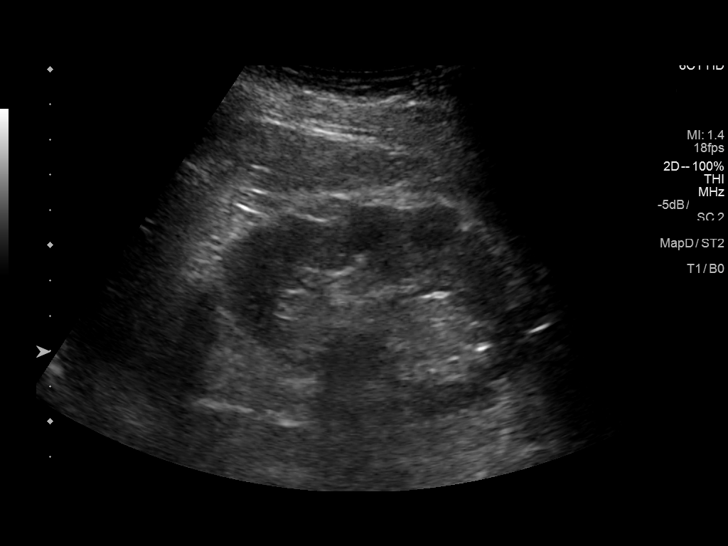
[im 8/32]
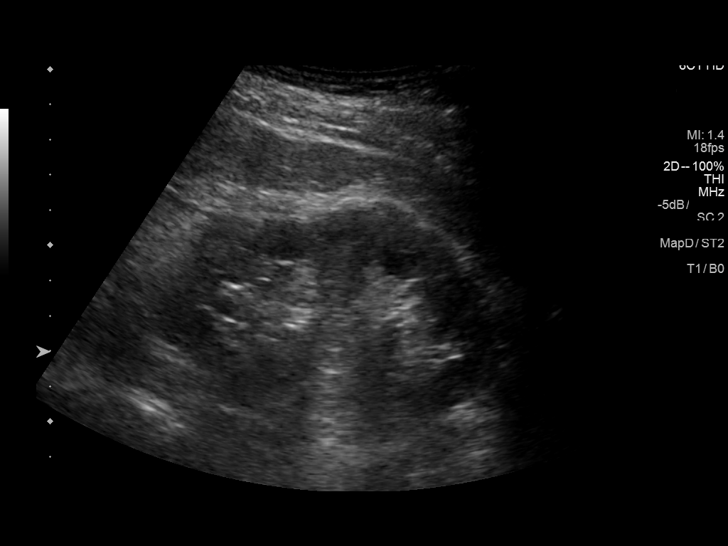
[im 11/32]
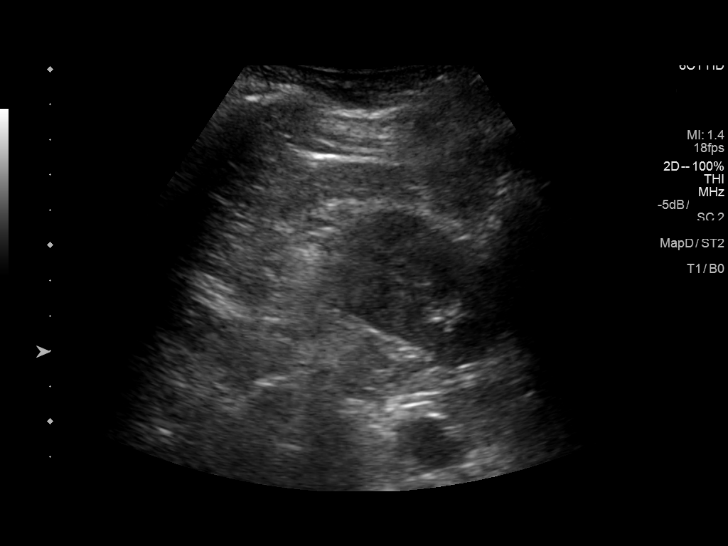
[im 12/32]
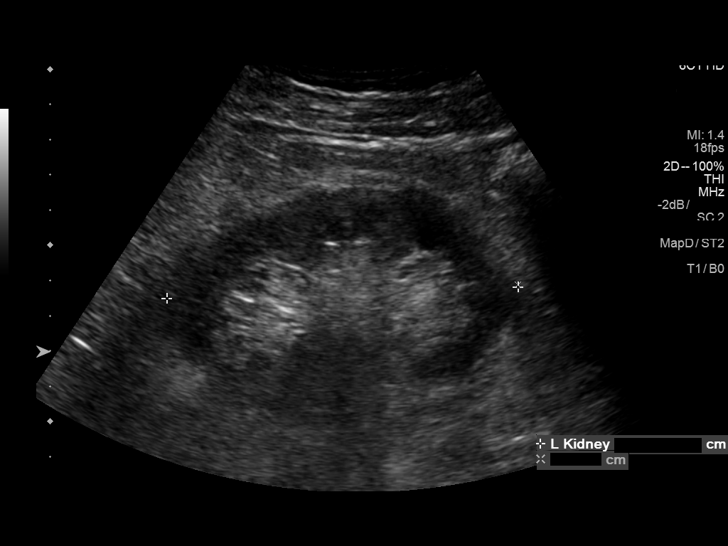
[im 15/32]
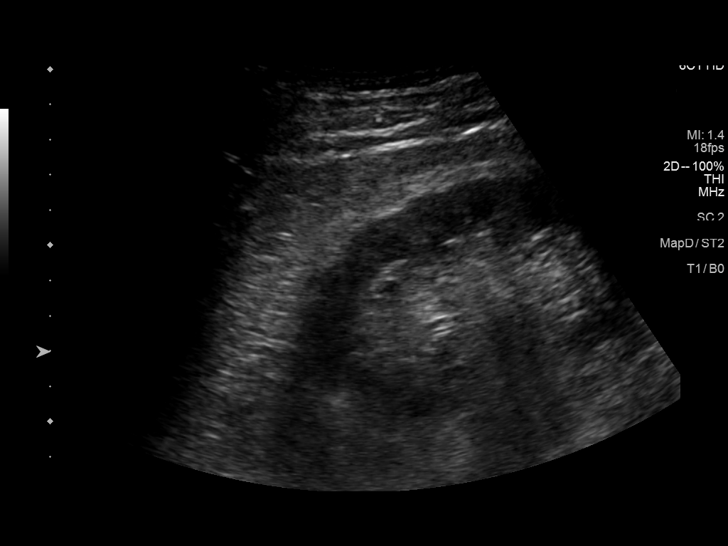
[im 17/32]
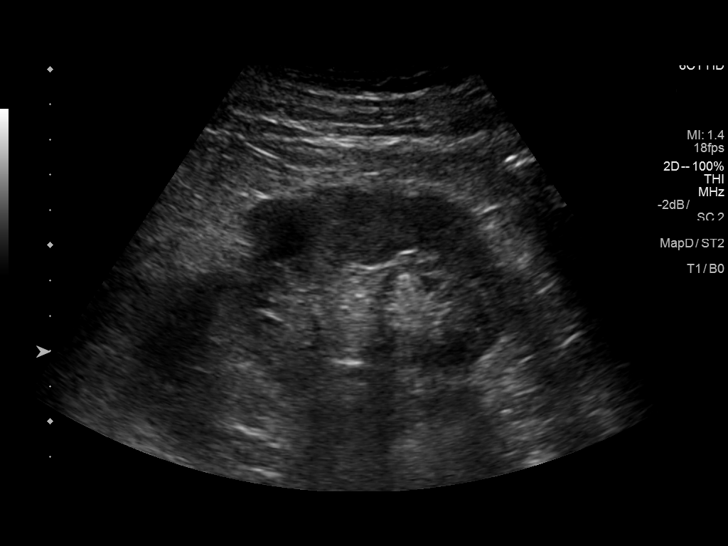
[im 20/32]
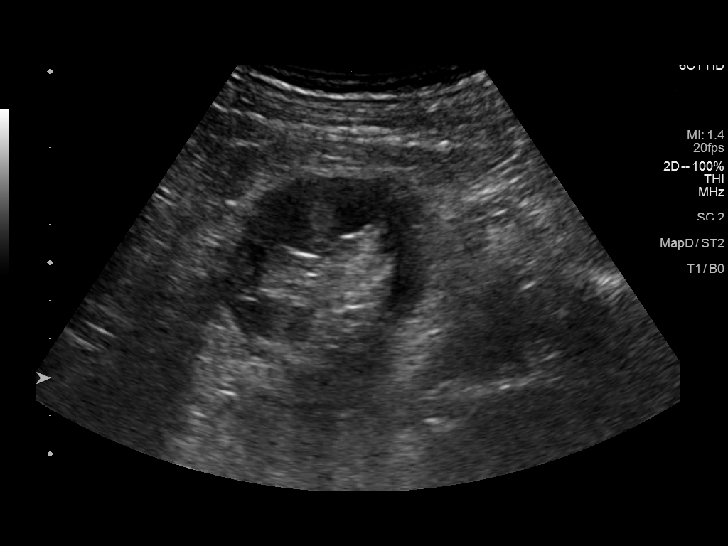
[im 21/32]
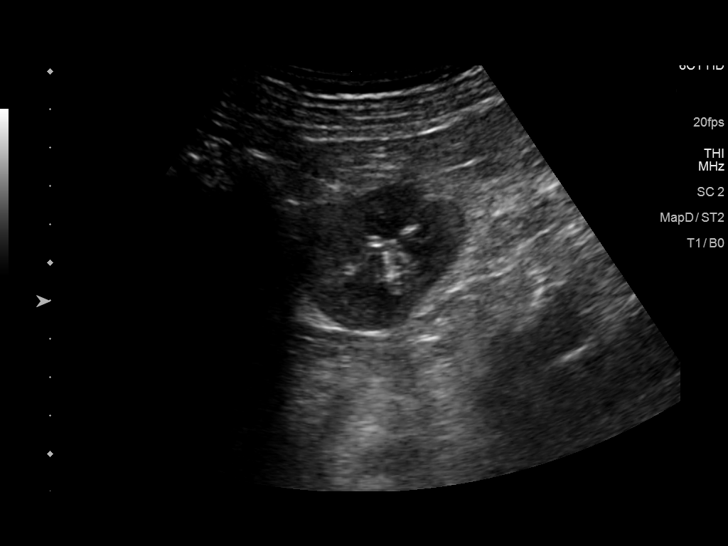
[im 24/32]
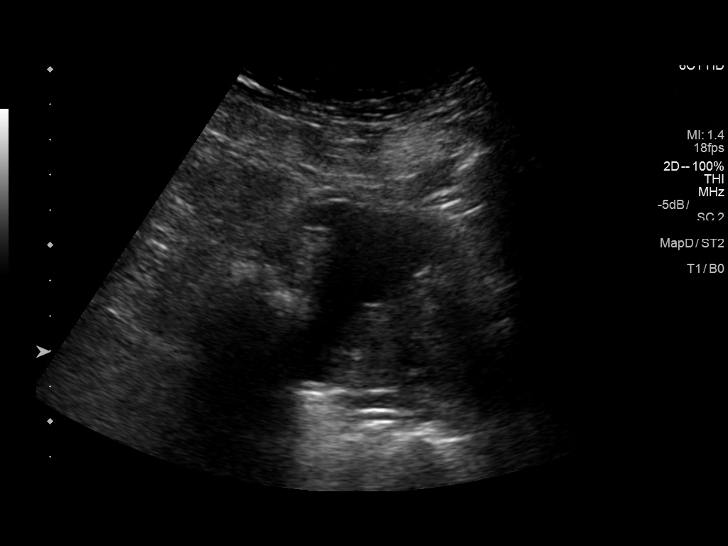
[im 26/32]
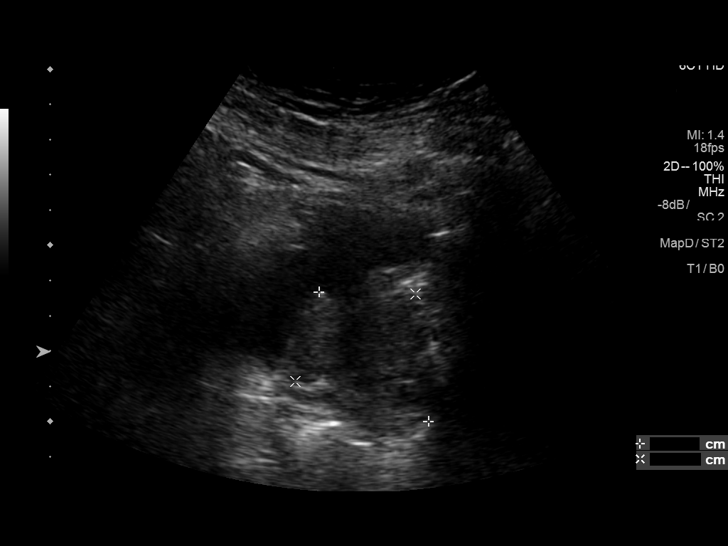
[im 29/32]
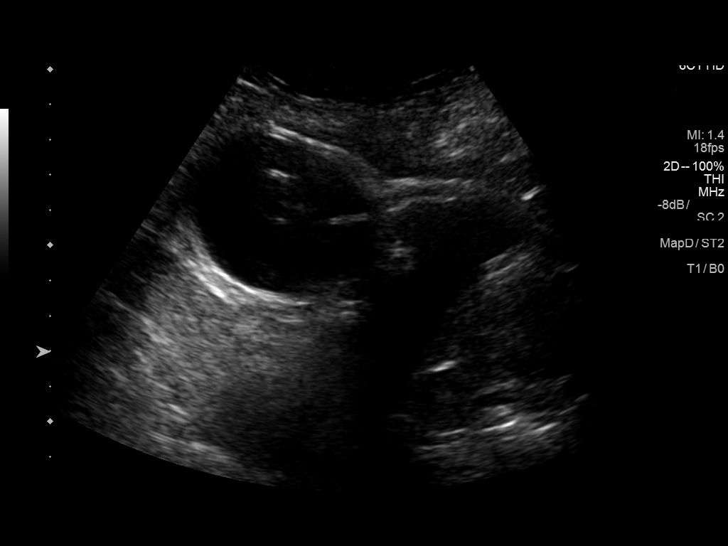
[im 32/32]
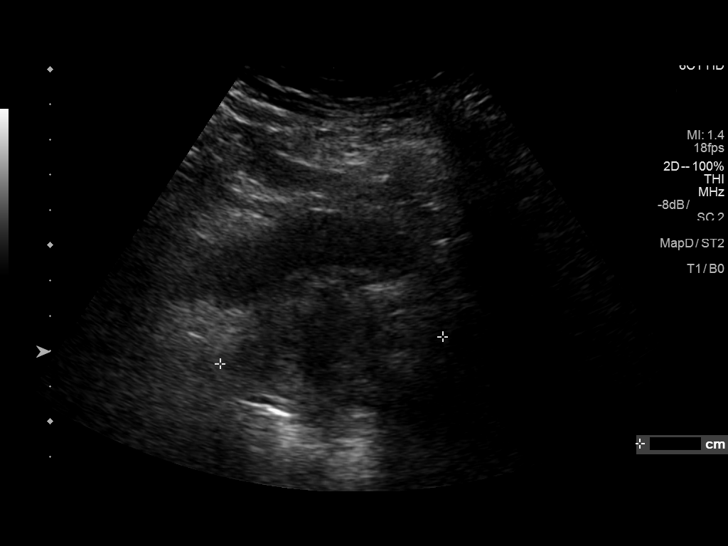

[14 of 25 positions shown; findings below may reference images not displayed]

FINDINGS: Right Kidney:

Length: 10.2 cm. Echogenicity within normal limits. No mass or
hydronephrosis visualized.

Left Kidney:

Length: 10 cm. Echogenicity within normal limits. No mass or
hydronephrosis visualized.

Bladder:

Appears normal for degree of bladder distention. There is mild
enlarged prostate gland with indentation of urinary bladder base.
Prostate gland measures 6.4 x 4.8 cm.
IMPRESSION: 1. No hydronephrosis.  No renal calculi are noted.
2. Mild enlarged prostate gland with indentation of urinary bladder
base. Prostate gland measures 4.8 x 6.4 cm

## 2018-05-06 ENCOUNTER — Telehealth (INDEPENDENT_AMBULATORY_CARE_PROVIDER_SITE_OTHER): Payer: Self-pay | Admitting: Orthopedic Surgery

## 2018-05-06 NOTE — Telephone Encounter (Signed)
[facility]  [phone]   LMN signed off needed

## 2018-05-06 NOTE — Telephone Encounter (Signed)
Signed today will fax back.

## 2018-05-16 DIAGNOSIS — H524 Presbyopia: Secondary | ICD-10-CM | POA: Diagnosis not present

## 2018-05-20 DIAGNOSIS — M25371 Other instability, right ankle: Secondary | ICD-10-CM | POA: Diagnosis not present

## 2018-05-31 DIAGNOSIS — D508 Other iron deficiency anemias: Secondary | ICD-10-CM | POA: Diagnosis not present

## 2018-05-31 DIAGNOSIS — E1129 Type 2 diabetes mellitus with other diabetic kidney complication: Secondary | ICD-10-CM | POA: Diagnosis not present

## 2018-05-31 DIAGNOSIS — Z6823 Body mass index (BMI) 23.0-23.9, adult: Secondary | ICD-10-CM | POA: Diagnosis not present

## 2018-05-31 DIAGNOSIS — E7849 Other hyperlipidemia: Secondary | ICD-10-CM | POA: Diagnosis not present

## 2018-05-31 DIAGNOSIS — I2510 Atherosclerotic heart disease of native coronary artery without angina pectoris: Secondary | ICD-10-CM | POA: Diagnosis not present

## 2018-05-31 DIAGNOSIS — Z23 Encounter for immunization: Secondary | ICD-10-CM | POA: Diagnosis not present

## 2018-05-31 DIAGNOSIS — I10 Essential (primary) hypertension: Secondary | ICD-10-CM | POA: Diagnosis not present

## 2018-05-31 DIAGNOSIS — N183 Chronic kidney disease, stage 3 (moderate): Secondary | ICD-10-CM | POA: Diagnosis not present

## 2018-06-07 DIAGNOSIS — I10 Essential (primary) hypertension: Secondary | ICD-10-CM | POA: Diagnosis not present

## 2018-07-25 DIAGNOSIS — I10 Essential (primary) hypertension: Secondary | ICD-10-CM | POA: Diagnosis not present

## 2018-09-27 DIAGNOSIS — E1129 Type 2 diabetes mellitus with other diabetic kidney complication: Secondary | ICD-10-CM | POA: Diagnosis not present

## 2018-09-27 DIAGNOSIS — E7849 Other hyperlipidemia: Secondary | ICD-10-CM | POA: Diagnosis not present

## 2018-09-27 DIAGNOSIS — R82998 Other abnormal findings in urine: Secondary | ICD-10-CM | POA: Diagnosis not present

## 2018-09-27 DIAGNOSIS — I10 Essential (primary) hypertension: Secondary | ICD-10-CM | POA: Diagnosis not present

## 2018-09-27 DIAGNOSIS — Z125 Encounter for screening for malignant neoplasm of prostate: Secondary | ICD-10-CM | POA: Diagnosis not present

## 2018-09-30 DIAGNOSIS — I2510 Atherosclerotic heart disease of native coronary artery without angina pectoris: Secondary | ICD-10-CM | POA: Diagnosis not present

## 2018-09-30 DIAGNOSIS — D509 Iron deficiency anemia, unspecified: Secondary | ICD-10-CM | POA: Diagnosis not present

## 2018-09-30 DIAGNOSIS — I739 Peripheral vascular disease, unspecified: Secondary | ICD-10-CM | POA: Diagnosis not present

## 2018-09-30 DIAGNOSIS — Z0000 Encounter for general adult medical examination without abnormal findings: Secondary | ICD-10-CM | POA: Diagnosis not present

## 2018-09-30 DIAGNOSIS — I10 Essential (primary) hypertension: Secondary | ICD-10-CM | POA: Diagnosis not present

## 2018-09-30 DIAGNOSIS — I5020 Unspecified systolic (congestive) heart failure: Secondary | ICD-10-CM | POA: Diagnosis not present

## 2018-09-30 DIAGNOSIS — E1129 Type 2 diabetes mellitus with other diabetic kidney complication: Secondary | ICD-10-CM | POA: Diagnosis not present

## 2018-09-30 DIAGNOSIS — J449 Chronic obstructive pulmonary disease, unspecified: Secondary | ICD-10-CM | POA: Diagnosis not present

## 2018-09-30 DIAGNOSIS — N183 Chronic kidney disease, stage 3 (moderate): Secondary | ICD-10-CM | POA: Diagnosis not present

## 2018-09-30 DIAGNOSIS — E7849 Other hyperlipidemia: Secondary | ICD-10-CM | POA: Diagnosis not present

## 2018-10-03 DIAGNOSIS — Z1212 Encounter for screening for malignant neoplasm of rectum: Secondary | ICD-10-CM | POA: Diagnosis not present

## 2018-11-13 DIAGNOSIS — N183 Chronic kidney disease, stage 3 (moderate): Secondary | ICD-10-CM | POA: Diagnosis not present

## 2018-11-13 DIAGNOSIS — R5383 Other fatigue: Secondary | ICD-10-CM | POA: Diagnosis not present

## 2018-11-13 DIAGNOSIS — E875 Hyperkalemia: Secondary | ICD-10-CM | POA: Diagnosis not present

## 2018-11-13 DIAGNOSIS — I10 Essential (primary) hypertension: Secondary | ICD-10-CM | POA: Diagnosis not present

## 2018-11-13 DIAGNOSIS — D509 Iron deficiency anemia, unspecified: Secondary | ICD-10-CM | POA: Diagnosis not present

## 2018-11-27 DIAGNOSIS — E1129 Type 2 diabetes mellitus with other diabetic kidney complication: Secondary | ICD-10-CM | POA: Diagnosis not present

## 2018-11-29 ENCOUNTER — Encounter: Payer: Self-pay | Admitting: Gastroenterology

## 2018-12-02 DIAGNOSIS — H1132 Conjunctival hemorrhage, left eye: Secondary | ICD-10-CM | POA: Diagnosis not present

## 2018-12-02 DIAGNOSIS — E113593 Type 2 diabetes mellitus with proliferative diabetic retinopathy without macular edema, bilateral: Secondary | ICD-10-CM | POA: Diagnosis not present

## 2018-12-11 DIAGNOSIS — D508 Other iron deficiency anemias: Secondary | ICD-10-CM | POA: Diagnosis not present

## 2018-12-23 ENCOUNTER — Other Ambulatory Visit (HOSPITAL_COMMUNITY): Payer: Self-pay | Admitting: *Deleted

## 2018-12-23 ENCOUNTER — Other Ambulatory Visit: Payer: Self-pay

## 2018-12-24 ENCOUNTER — Encounter (HOSPITAL_COMMUNITY)
Admission: RE | Admit: 2018-12-24 | Discharge: 2018-12-24 | Disposition: A | Discharge status: Home / Self Care | Payer: Medicare HMO | Source: Ambulatory Visit | Attending: Internal Medicine | Admitting: Internal Medicine

## 2018-12-24 DIAGNOSIS — D649 Anemia, unspecified: Secondary | ICD-10-CM | POA: Insufficient documentation

## 2018-12-24 MED ORDER — SODIUM CHLORIDE 0.9 % IV SOLN
510.0000 mg | INTRAVENOUS | Status: DC
  Administered 2018-12-24: 510 mg via INTRAVENOUS
  Filled 2018-12-24: qty 510

## 2018-12-24 NOTE — Discharge Instructions (Signed)

## 2018-12-27 ENCOUNTER — Ambulatory Visit: Payer: Medicare HMO | Admitting: Gastroenterology

## 2018-12-30 ENCOUNTER — Other Ambulatory Visit: Payer: Self-pay

## 2018-12-30 ENCOUNTER — Other Ambulatory Visit (HOSPITAL_COMMUNITY): Payer: Self-pay | Admitting: *Deleted

## 2018-12-31 ENCOUNTER — Encounter (HOSPITAL_COMMUNITY)
Admission: RE | Admit: 2018-12-31 | Discharge: 2018-12-31 | Disposition: A | Discharge status: Home / Self Care | Payer: Medicare HMO | Source: Ambulatory Visit | Attending: Internal Medicine | Admitting: Internal Medicine

## 2018-12-31 DIAGNOSIS — D649 Anemia, unspecified: Secondary | ICD-10-CM | POA: Insufficient documentation

## 2018-12-31 MED ORDER — SODIUM CHLORIDE 0.9 % IV SOLN
510.0000 mg | INTRAVENOUS | Status: DC
  Administered 2018-12-31: 510 mg via INTRAVENOUS
  Filled 2018-12-31: qty 510

## 2019-01-31 DIAGNOSIS — E785 Hyperlipidemia, unspecified: Secondary | ICD-10-CM | POA: Diagnosis not present

## 2019-01-31 DIAGNOSIS — I447 Left bundle-branch block, unspecified: Secondary | ICD-10-CM | POA: Diagnosis not present

## 2019-01-31 DIAGNOSIS — D509 Iron deficiency anemia, unspecified: Secondary | ICD-10-CM | POA: Diagnosis not present

## 2019-01-31 DIAGNOSIS — J181 Lobar pneumonia, unspecified organism: Secondary | ICD-10-CM | POA: Diagnosis not present

## 2019-01-31 DIAGNOSIS — I129 Hypertensive chronic kidney disease with stage 1 through stage 4 chronic kidney disease, or unspecified chronic kidney disease: Secondary | ICD-10-CM | POA: Diagnosis not present

## 2019-01-31 DIAGNOSIS — N183 Chronic kidney disease, stage 3 (moderate): Secondary | ICD-10-CM | POA: Diagnosis not present

## 2019-01-31 DIAGNOSIS — E1129 Type 2 diabetes mellitus with other diabetic kidney complication: Secondary | ICD-10-CM | POA: Diagnosis not present

## 2019-01-31 DIAGNOSIS — R0609 Other forms of dyspnea: Secondary | ICD-10-CM | POA: Diagnosis not present

## 2019-01-31 DIAGNOSIS — I2510 Atherosclerotic heart disease of native coronary artery without angina pectoris: Secondary | ICD-10-CM | POA: Diagnosis not present

## 2019-01-31 DIAGNOSIS — R600 Localized edema: Secondary | ICD-10-CM | POA: Diagnosis not present

## 2019-01-31 DIAGNOSIS — I5020 Unspecified systolic (congestive) heart failure: Secondary | ICD-10-CM | POA: Diagnosis not present

## 2019-01-31 DIAGNOSIS — R7301 Impaired fasting glucose: Secondary | ICD-10-CM | POA: Diagnosis not present

## 2019-02-03 ENCOUNTER — Telehealth: Payer: Self-pay | Admitting: *Deleted

## 2019-02-03 NOTE — Telephone Encounter (Signed)
REFERRAL SENT TO SCHEDULING AND NOTES ON FILE FROM [facility] [phone].

## 2019-02-04 ENCOUNTER — Other Ambulatory Visit: Payer: Self-pay

## 2019-02-04 ENCOUNTER — Inpatient Hospital Stay (HOSPITAL_COMMUNITY)
Admission: EM | Admit: 2019-02-04 | Discharge: 2019-02-10 | DRG: 286 | Disposition: A | Discharge status: Home / Self Care | Payer: Medicare HMO | Attending: Family Medicine | Admitting: Family Medicine

## 2019-02-04 ENCOUNTER — Emergency Department (HOSPITAL_COMMUNITY): Payer: Medicare HMO

## 2019-02-04 DIAGNOSIS — N183 Chronic kidney disease, stage 3 unspecified: Secondary | ICD-10-CM | POA: Diagnosis present

## 2019-02-04 DIAGNOSIS — D631 Anemia in chronic kidney disease: Secondary | ICD-10-CM | POA: Diagnosis present

## 2019-02-04 DIAGNOSIS — I10 Essential (primary) hypertension: Secondary | ICD-10-CM | POA: Diagnosis not present

## 2019-02-04 DIAGNOSIS — J90 Pleural effusion, not elsewhere classified: Secondary | ICD-10-CM | POA: Diagnosis not present

## 2019-02-04 DIAGNOSIS — I5043 Acute on chronic combined systolic (congestive) and diastolic (congestive) heart failure: Secondary | ICD-10-CM | POA: Diagnosis present

## 2019-02-04 DIAGNOSIS — J189 Pneumonia, unspecified organism: Secondary | ICD-10-CM

## 2019-02-04 DIAGNOSIS — G4700 Insomnia, unspecified: Secondary | ICD-10-CM | POA: Diagnosis present

## 2019-02-04 DIAGNOSIS — Z87891 Personal history of nicotine dependence: Secondary | ICD-10-CM | POA: Diagnosis not present

## 2019-02-04 DIAGNOSIS — I361 Nonrheumatic tricuspid (valve) insufficiency: Secondary | ICD-10-CM | POA: Diagnosis not present

## 2019-02-04 DIAGNOSIS — E1149 Type 2 diabetes mellitus with other diabetic neurological complication: Secondary | ICD-10-CM | POA: Diagnosis not present

## 2019-02-04 DIAGNOSIS — I472 Ventricular tachycardia: Secondary | ICD-10-CM | POA: Diagnosis not present

## 2019-02-04 DIAGNOSIS — E785 Hyperlipidemia, unspecified: Secondary | ICD-10-CM | POA: Diagnosis not present

## 2019-02-04 DIAGNOSIS — R600 Localized edema: Secondary | ICD-10-CM | POA: Diagnosis not present

## 2019-02-04 DIAGNOSIS — E1151 Type 2 diabetes mellitus with diabetic peripheral angiopathy without gangrene: Secondary | ICD-10-CM | POA: Diagnosis present

## 2019-02-04 DIAGNOSIS — Z8249 Family history of ischemic heart disease and other diseases of the circulatory system: Secondary | ICD-10-CM

## 2019-02-04 DIAGNOSIS — E1122 Type 2 diabetes mellitus with diabetic chronic kidney disease: Secondary | ICD-10-CM | POA: Diagnosis present

## 2019-02-04 DIAGNOSIS — J9811 Atelectasis: Secondary | ICD-10-CM | POA: Diagnosis not present

## 2019-02-04 DIAGNOSIS — I428 Other cardiomyopathies: Secondary | ICD-10-CM | POA: Diagnosis present

## 2019-02-04 DIAGNOSIS — Z833 Family history of diabetes mellitus: Secondary | ICD-10-CM

## 2019-02-04 DIAGNOSIS — Z20828 Contact with and (suspected) exposure to other viral communicable diseases: Secondary | ICD-10-CM | POA: Diagnosis not present

## 2019-02-04 DIAGNOSIS — R300 Dysuria: Secondary | ICD-10-CM | POA: Diagnosis not present

## 2019-02-04 DIAGNOSIS — I130 Hypertensive heart and chronic kidney disease with heart failure and stage 1 through stage 4 chronic kidney disease, or unspecified chronic kidney disease: Principal | ICD-10-CM | POA: Diagnosis present

## 2019-02-04 DIAGNOSIS — E1129 Type 2 diabetes mellitus with other diabetic kidney complication: Secondary | ICD-10-CM

## 2019-02-04 DIAGNOSIS — I255 Ischemic cardiomyopathy: Secondary | ICD-10-CM | POA: Diagnosis not present

## 2019-02-04 DIAGNOSIS — Z9114 Patient's other noncompliance with medication regimen: Secondary | ICD-10-CM

## 2019-02-04 DIAGNOSIS — D509 Iron deficiency anemia, unspecified: Secondary | ICD-10-CM | POA: Diagnosis not present

## 2019-02-04 DIAGNOSIS — I2510 Atherosclerotic heart disease of native coronary artery without angina pectoris: Secondary | ICD-10-CM | POA: Diagnosis not present

## 2019-02-04 DIAGNOSIS — Z8673 Personal history of transient ischemic attack (TIA), and cerebral infarction without residual deficits: Secondary | ICD-10-CM | POA: Diagnosis not present

## 2019-02-04 DIAGNOSIS — I5023 Acute on chronic systolic (congestive) heart failure: Secondary | ICD-10-CM | POA: Diagnosis not present

## 2019-02-04 DIAGNOSIS — I509 Heart failure, unspecified: Secondary | ICD-10-CM

## 2019-02-04 DIAGNOSIS — Z79899 Other long term (current) drug therapy: Secondary | ICD-10-CM | POA: Diagnosis not present

## 2019-02-04 DIAGNOSIS — N179 Acute kidney failure, unspecified: Secondary | ICD-10-CM | POA: Diagnosis not present

## 2019-02-04 DIAGNOSIS — I5041 Acute combined systolic (congestive) and diastolic (congestive) heart failure: Secondary | ICD-10-CM | POA: Diagnosis not present

## 2019-02-04 DIAGNOSIS — Z1159 Encounter for screening for other viral diseases: Secondary | ICD-10-CM

## 2019-02-04 DIAGNOSIS — I5021 Acute systolic (congestive) heart failure: Secondary | ICD-10-CM | POA: Diagnosis not present

## 2019-02-04 DIAGNOSIS — R0609 Other forms of dyspnea: Secondary | ICD-10-CM | POA: Diagnosis not present

## 2019-02-04 DIAGNOSIS — Z7902 Long term (current) use of antithrombotics/antiplatelets: Secondary | ICD-10-CM

## 2019-02-04 DIAGNOSIS — I447 Left bundle-branch block, unspecified: Secondary | ICD-10-CM | POA: Diagnosis present

## 2019-02-04 DIAGNOSIS — I129 Hypertensive chronic kidney disease with stage 1 through stage 4 chronic kidney disease, or unspecified chronic kidney disease: Secondary | ICD-10-CM | POA: Diagnosis not present

## 2019-02-04 DIAGNOSIS — I110 Hypertensive heart disease with heart failure: Secondary | ICD-10-CM | POA: Diagnosis not present

## 2019-02-04 DIAGNOSIS — Z7984 Long term (current) use of oral hypoglycemic drugs: Secondary | ICD-10-CM | POA: Diagnosis not present

## 2019-02-04 DIAGNOSIS — I5020 Unspecified systolic (congestive) heart failure: Secondary | ICD-10-CM | POA: Diagnosis not present

## 2019-02-04 DIAGNOSIS — N184 Chronic kidney disease, stage 4 (severe): Secondary | ICD-10-CM | POA: Diagnosis present

## 2019-02-04 DIAGNOSIS — R0602 Shortness of breath: Secondary | ICD-10-CM | POA: Diagnosis not present

## 2019-02-04 DIAGNOSIS — I5022 Chronic systolic (congestive) heart failure: Secondary | ICD-10-CM

## 2019-02-04 DIAGNOSIS — R0989 Other specified symptoms and signs involving the circulatory and respiratory systems: Secondary | ICD-10-CM

## 2019-02-04 DIAGNOSIS — R609 Edema, unspecified: Secondary | ICD-10-CM | POA: Diagnosis not present

## 2019-02-04 DIAGNOSIS — J181 Lobar pneumonia, unspecified organism: Secondary | ICD-10-CM | POA: Diagnosis not present

## 2019-02-04 LAB — CBC WITH DIFFERENTIAL/PLATELET
Abs Immature Granulocytes: 0.02 10*3/uL (ref 0.00–0.07)
Basophils Absolute: 0 10*3/uL (ref 0.0–0.1)
Basophils Relative: 0 %
Eosinophils Absolute: 0.1 10*3/uL (ref 0.0–0.5)
Eosinophils Relative: 2 %
HCT: 39.5 % (ref 39.0–52.0)
Hemoglobin: 12 g/dL — ABNORMAL LOW (ref 13.0–17.0)
Immature Granulocytes: 0 %
Lymphocytes Relative: 21 %
Lymphs Abs: 1 10*3/uL (ref 0.7–4.0)
MCH: 29.3 pg (ref 26.0–34.0)
MCHC: 30.4 g/dL (ref 30.0–36.0)
MCV: 96.6 fL (ref 80.0–100.0)
Monocytes Absolute: 0.4 10*3/uL (ref 0.1–1.0)
Monocytes Relative: 8 %
Neutro Abs: 3.3 10*3/uL (ref 1.7–7.7)
Neutrophils Relative %: 69 %
Platelets: 184 10*3/uL (ref 150–400)
RBC: 4.09 MIL/uL — ABNORMAL LOW (ref 4.22–5.81)
RDW: 17.6 % — ABNORMAL HIGH (ref 11.5–15.5)
WBC: 4.8 10*3/uL (ref 4.0–10.5)
nRBC: 0 % (ref 0.0–0.2)

## 2019-02-04 LAB — URINALYSIS, ROUTINE W REFLEX MICROSCOPIC
Bacteria, UA: NONE SEEN
Bilirubin Urine: NEGATIVE
Glucose, UA: NEGATIVE mg/dL
Hgb urine dipstick: NEGATIVE
Ketones, ur: NEGATIVE mg/dL
Leukocytes,Ua: NEGATIVE
Nitrite: NEGATIVE
Protein, ur: 100 mg/dL — AB
Specific Gravity, Urine: 1.019 (ref 1.005–1.030)
pH: 5 (ref 5.0–8.0)

## 2019-02-04 LAB — COMPREHENSIVE METABOLIC PANEL
ALT: 45 U/L — ABNORMAL HIGH (ref 0–44)
AST: 49 U/L — ABNORMAL HIGH (ref 15–41)
Albumin: 3.3 g/dL — ABNORMAL LOW (ref 3.5–5.0)
Alkaline Phosphatase: 60 U/L (ref 38–126)
Anion gap: 8 (ref 5–15)
BUN: 28 mg/dL — ABNORMAL HIGH (ref 8–23)
CO2: 20 mmol/L — ABNORMAL LOW (ref 22–32)
Calcium: 8.8 mg/dL — ABNORMAL LOW (ref 8.9–10.3)
Chloride: 114 mmol/L — ABNORMAL HIGH (ref 98–111)
Creatinine, Ser: 1.87 mg/dL — ABNORMAL HIGH (ref 0.61–1.24)
GFR calc Af Amer: 40 mL/min — ABNORMAL LOW (ref >60)
GFR calc non Af Amer: 34 mL/min — ABNORMAL LOW (ref >60)
Glucose, Bld: 149 mg/dL — ABNORMAL HIGH (ref 70–99)
Potassium: 4.7 mmol/L (ref 3.5–5.1)
Sodium: 142 mmol/L (ref 135–145)
Total Bilirubin: 0.6 mg/dL (ref 0.3–1.2)
Total Protein: 5.6 g/dL — ABNORMAL LOW (ref 6.5–8.1)

## 2019-02-04 LAB — BRAIN NATRIURETIC PEPTIDE: B Natriuretic Peptide: 2222.4 pg/mL — ABNORMAL HIGH (ref 0.0–100.0)

## 2019-02-04 LAB — SARS CORONAVIRUS 2 BY RT PCR (HOSPITAL ORDER, PERFORMED IN ~~LOC~~ HOSPITAL LAB): SARS Coronavirus 2: NEGATIVE

## 2019-02-04 LAB — LACTIC ACID, PLASMA
Lactic Acid, Venous: 1.2 mmol/L (ref 0.5–1.9)
Lactic Acid, Venous: 1 mmol/L (ref 0.5–1.9)

## 2019-02-04 LAB — TROPONIN I: Troponin I: 0.03 ng/mL (ref <0.03)

## 2019-02-04 MED ORDER — SODIUM CHLORIDE 0.9 % IV SOLN
1.0000 g | Freq: Once | INTRAVENOUS | Status: AC
  Administered 2019-02-04: 22:00:00 1 g via INTRAVENOUS
  Filled 2019-02-04: qty 10

## 2019-02-04 MED ORDER — IPRATROPIUM BROMIDE HFA 17 MCG/ACT IN AERS: 2.0000 | INHALATION_SPRAY | Freq: Once | RESPIRATORY_TRACT | Status: DC

## 2019-02-04 MED ORDER — ALBUTEROL SULFATE HFA 108 (90 BASE) MCG/ACT IN AERS
2.0000 | INHALATION_SPRAY | Freq: Once | RESPIRATORY_TRACT | Status: AC
  Administered 2019-02-04: 20:00:00 2 via RESPIRATORY_TRACT
  Filled 2019-02-04: qty 6.7

## 2019-02-04 MED ORDER — AZITHROMYCIN 500 MG IV SOLR
500.0000 mg | Freq: Once | INTRAVENOUS | Status: DC
  Administered 2019-02-05: 04:00:00 500 mg via INTRAVENOUS

## 2019-02-04 MED ORDER — FUROSEMIDE 10 MG/ML IJ SOLN
40.0000 mg | Freq: Once | INTRAMUSCULAR | Status: AC
  Administered 2019-02-05: 01:00:00 40 mg via INTRAVENOUS
  Filled 2019-02-04: qty 4

## 2019-02-04 NOTE — ED Notes (Signed)
Report called to Angie on [place].  Will transport patient on monitor

## 2019-02-04 NOTE — ED Notes (Signed)
ED TO INPATIENT HANDOFF REPORT  ED Nurse Name and Phone #: Jonelle Sidle [phone]  S Name/Age/Gender [name] 78 y.o. male Room/Bed: 033C/033C  Code Status   Code Status: Prior  Home/SNF/Other Home Patient oriented to: self, place, time and situation Is this baseline? Yes   Triage Complete: Triage complete  Chief Complaint heart problems and fluid  Triage Note Pt came in after being sent by PCP for shortness of breath for a month.  BLE edema noted   Allergies No Known Allergies  Level of Care/Admitting Diagnosis ED Disposition    ED Disposition Condition Comment   Admit  Hospital Area: [place] [[id number]]  Level of Care: Telemetry Cardiac [103]  Covid Evaluation: N/A  Diagnosis: Acute CHF (congestive heart failure) [facility]) [[id number]]  Admitting Physician: Rise Patience [phone]  Attending Physician: Rise Patience [phone]  Estimated length of stay: past midnight tomorrow  Certification:: I certify this patient will need inpatient services for at least 2 midnights  PT Class (Do Not Modify): Inpatient [101]  PT Acc Code (Do Not Modify): Private [1]       B Medical/Surgery History Past Medical History:  Diagnosis Date  . Allergy   . Atrial fibrillation ([place])   . Cataract   . Chronic kidney disease    Renal Insuffiecny  . Colon polyps 2012   Colonoscopy  . Diabetes mellitus    type 2  . Diverticulosis 2012   Colonoscopy   . Hyperlipidemia   . Hypertension   . LBBB (left bundle branch block)   . Pneumonia 2015   3 times  . Stroke [facility]) 2015   Past Surgical History:  Procedure Laterality Date  . 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 CATARACT EXTRACTION Bilateral    both eyes  . COLON SURGERY     to repair intestines as child  . COLONOSCOPY  10/20/2011   Procedure: COLONOSCOPY;  Surgeon: Inda Castle, MD;  Location: [facility];  Service: Endoscopy;  Laterality: N/A;  . FEMORAL-POPLITEAL BYPASS GRAFT Right 04/07/2016   Procedure: BYPASS GRAFT FEMORAL-POPLITEAL ARTERY-RIGHT;  Surgeon: Angelia Mould, MD;  Location: [place];  Service: Vascular;  Laterality: Right;  . HOT HEMOSTASIS  10/20/2011   Procedure: HOT HEMOSTASIS (ARGON PLASMA COAGULATION/BICAP);  Surgeon: Inda Castle, MD;  Location: [facility];  Service: Endoscopy;  Laterality: N/A;  . KNEE ARTHROSCOPY Left    left  . NECK SURGERY     disectomy  . PERIPHERAL VASCULAR CATHETERIZATION Right 04/05/2016   Procedure: Lower Extremity Angiography;  Surgeon: Rosetta Posner, MD;  Location: [place] CV [facility];  Service: Cardiovascular;  Laterality: Right;  . PERIPHERAL VASCULAR CATHETERIZATION N/A 04/05/2016   Procedure: Abdominal Aortogram;  Surgeon: Rosetta Posner, MD;  Location: [place] [facility];  Service: Cardiovascular;  Laterality: N/A;  . VEIN HARVEST Right 04/07/2016   Procedure: VEIN HARVEST GREATER SAPHENOUS VIEN ;  Surgeon: Angelia Mould, MD;  Location: [facility] OR;  Service: Vascular;  Laterality: Right;     A IV Location/Drains/Wounds Patient Lines/Drains/Airways Status   Active Line/Drains/Airways    Name:   Placement date:   Placement time:   Site:   Days:   Peripheral IV 04/07/16 Left Forearm   04/07/16    0700    Forearm   1033   Peripheral IV 04/07/16 Right Wrist   04/07/16  0715    Wrist   1033   Peripheral IV 02/04/19 Left Wrist   02/04/19    1911    Wrist   less than 1   Incision (Closed) 04/07/16 Groin Right   04/07/16    0814     1033   Incision (Closed) 04/07/16 Knee Right   04/07/16    0814     1033   Incision (Closed) 04/07/16 Thigh Right;Upper   04/07/16    0814     1033   Incision (Closed) 04/07/16 Thigh Right   04/07/16    1024     1033   Incision (Closed) 04/07/16 Thigh Right   04/07/16    1056     1033   Incision (Closed) 04/07/16 Thigh Right   04/07/16    1056     1033   Incision  (Closed) 04/07/16 Foot Right   04/07/16    1138     1033   Incision (Closed) 04/28/16 Leg Right   04/28/16    1338     1012   Wound / Incision (Open or Dehisced) 03/23/16 Puncture Foot Right non-healing puncture wound from nail going in foot several months ago   03/23/16    0120    Foot   1048          Intake/Output Last 24 hours No intake or output data in the 24 hours ending 02/04/19 2244  Labs/Imaging Results for orders placed or performed during the hospital encounter of 02/04/19 (from the past 17 hour(s))  SARS Coronavirus 2 (CEPHEID- Performed in [facility] hospital lab), Hosp Order     Status: None   Collection Time: 02/04/19  7:30 PM  Result Value Ref Range   SARS Coronavirus 2 NEGATIVE NEGATIVE    Comment: (NOTE) If result is NEGATIVE SARS-CoV-2 target nucleic acids are NOT DETECTED. The SARS-CoV-2 RNA is generally detectable in upper and lower  respiratory specimens during the acute phase of infection. The lowest  concentration of SARS-CoV-2 viral copies this assay can detect is 250  copies / mL. A negative result does not preclude SARS-CoV-2 infection  and should not be used as the sole basis for treatment or other  patient management decisions.  A negative result may occur with  improper specimen collection / handling, submission of specimen other  than nasopharyngeal swab, presence of viral mutation(s) within the  areas targeted by this assay, and inadequate number of viral copies  (<250 copies / mL). A negative result must be combined with clinical  observations, patient history, and epidemiological information. If result is POSITIVE SARS-CoV-2 target nucleic acids are DETECTED. The SARS-CoV-2 RNA is generally detectable in upper and lower  respiratory specimens dur ing the acute phase of infection.  Positive  results are indicative of active infection with SARS-CoV-2.  Clinical  correlation with patient history and other diagnostic information is  necessary to  determine patient infection status.  Positive results do  not rule out bacterial infection or co-infection with other viruses. If result is PRESUMPTIVE POSTIVE SARS-CoV-2 nucleic acids MAY BE PRESENT.   A presumptive positive result was obtained on the submitted specimen  and confirmed on repeat testing.  While 2019 novel coronavirus  (SARS-CoV-2) nucleic acids may be present in the submitted sample  additional confirmatory testing may be necessary for epidemiological  and / or clinical management purposes  to differentiate between  SARS-CoV-2 and other Sarbecovirus currently known to infect humans.  If clinically indicated additional testing with  an alternate test  methodology [phone]) is advised. The SARS-CoV-2 RNA is generally  detectable in upper and lower respiratory sp ecimens during the acute  phase of infection. The expected result is Negative. Fact Sheet for Patients:  StrictlyIdeas.no Fact Sheet for Healthcare Providers: BankingDealers.co.za This test is not yet approved or cleared by the [place] FDA and has been authorized for detection and/or diagnosis of SARS-CoV-2 by FDA under an Emergency Use Authorization (EUA).  This EUA will remain in effect (meaning this test can be used) for the duration of the COVID-19 declaration under Section 564(b)(1) of the Act, 21 U.S.C. section 360bbb-3(b)(1), unless the authorization is terminated or revoked sooner. Performed at [facility], [place] [address], [place], [place] [address]   Urinalysis, Routine w reflex microscopic     Status: Abnormal   Collection Time: 02/04/19  7:30 PM  Result Value Ref Range   Color, Urine YELLOW YELLOW   APPearance CLEAR CLEAR   Specific Gravity, Urine 1.019 1.005 - 1.030   pH 5.0 5.0 - 8.0   Glucose, UA NEGATIVE NEGATIVE mg/dL   Hgb urine dipstick NEGATIVE NEGATIVE   Bilirubin Urine NEGATIVE NEGATIVE   Ketones, ur NEGATIVE NEGATIVE mg/dL    Protein, ur 100 (A) NEGATIVE mg/dL   Nitrite NEGATIVE NEGATIVE   Leukocytes,Ua NEGATIVE NEGATIVE   RBC / HPF 0-5 0 - 5 RBC/hpf   WBC, UA 0-5 0 - 5 WBC/hpf   Bacteria, UA NONE SEEN NONE SEEN   Mucus PRESENT     Comment: Performed at [facility] [address], [place], [place] [address]  CBC with Differential     Status: Abnormal   Collection Time: 02/04/19  8:07 PM  Result Value Ref Range   WBC 4.8 4.0 - 10.5 K/uL   RBC 4.09 (L) 4.22 - 5.81 MIL/uL   Hemoglobin 12.0 (L) 13.0 - 17.0 g/dL   HCT 39.5 39.0 - 52.0 %   MCV 96.6 80.0 - 100.0 fL   MCH 29.3 26.0 - 34.0 pg   MCHC 30.4 30.0 - 36.0 g/dL   RDW 17.6 (H) 11.5 - 15.5 %   Platelets 184 150 - 400 K/uL   nRBC 0.0 0.0 - 0.2 %   Neutrophils Relative % 69 %   Neutro Abs 3.3 1.7 - 7.7 K/uL   Lymphocytes Relative 21 %   Lymphs Abs 1.0 0.7 - 4.0 K/uL   Monocytes Relative 8 %   Monocytes Absolute 0.4 0.1 - 1.0 K/uL   Eosinophils Relative 2 %   Eosinophils Absolute 0.1 0.0 - 0.5 K/uL   Basophils Relative 0 %   Basophils Absolute 0.0 0.0 - 0.1 K/uL   Immature Granulocytes 0 %   Abs Immature Granulocytes 0.02 0.00 - 0.07 K/uL    Comment: Performed at [facility] [address], [place],  [address]  Comprehensive metabolic panel     Status: Abnormal   Collection Time: 02/04/19  8:07 PM  Result Value Ref Range   Sodium 142 135 - 145 mmol/L   Potassium 4.7 3.5 - 5.1 mmol/L   Chloride 114 (H) 98 - 111 mmol/L   CO2 20 (L) 22 - 32 mmol/L   Glucose, Bld 149 (H) 70 - 99 mg/dL   BUN 28 (H) 8 - 23 mg/dL   Creatinine, Ser 1.87 (H) 0.61 - 1.24 mg/dL   Calcium 8.8 (L) 8.9 - 10.3 mg/dL   Total Protein 5.6 (L) 6.5 - 8.1 g/dL   Albumin 3.3 (  L) 3.5 - 5.0 g/dL   AST 49 (H) 15 - 41 U/L   ALT 45 (H) 0 - 44 U/L   Alkaline Phosphatase 60 38 - 126 U/L   Total Bilirubin 0.6 0.3 - 1.2 mg/dL   GFR calc non Af Amer 34 (L) >60 mL/min   GFR calc Af Amer 40 (L) >60 mL/min   Anion gap 8 5 - 15    Comment: Performed at [facility] [address], [place], [place] [address]  Troponin I - ONCE - STAT     Status: Abnormal   Collection Time: 02/04/19  8:07 PM  Result Value Ref Range   Troponin I 0.03 (HH) <0.03 ng/mL    Comment: CRITICAL RESULT CALLED TO, READ BACK BY AND VERIFIED WITH: Angelina Sheriff 2114 02/04/2019 WBOND Performed at [facility], [place] [address] [place], [place] [address]   Brain natriuretic peptide     Status: Abnormal   Collection Time: 02/04/19  8:08 PM  Result Value Ref Range   B Natriuretic Peptide 2,222.4 (H) 0.0 - 100.0 pg/mL    Comment: Performed at [facility] [address] [place], [place] [address]  Lactic acid, plasma     Status: None   Collection Time: 02/04/19  8:08 PM  Result Value Ref Range   Lactic Acid, Venous 1.2 0.5 - 1.9 mmol/L    Comment: Performed at [facility] [address], [place], [place] [address]  Lactic acid, plasma     Status: None   Collection Time: 02/04/19 10:00 PM  Result Value Ref Range   Lactic Acid, Venous 1.0 0.5 - 1.9 mmol/L    Comment: Performed at [facility] [address] [place],  [address]   Dg Chest Portable 1 View  Result Date: 02/04/2019 CLINICAL DATA:  78 year old male with shortness of breath. EXAM: PORTABLE CHEST 1 VIEW COMPARISON:  Chest radiograph dated 02/27/2016 FINDINGS: There is shallow inspiration with bibasilar atelectasis. Increased density at the left lung base may represent developing infiltrate. There is no pleural effusion or pneumothorax. The cardiac silhouette is within normal limits. No acute osseous pathology. IMPRESSION: Shallow inspiration with bibasilar atelectasis. Left lung base density concerning for pneumonia. Clinical correlation is recommended. Electronically Signed   By: Anner Crete M.D.   On: 02/04/2019 19:31    Pending Labs Unresulted Labs (From admission, onward)    Start     Ordered   02/04/19 2139  Blood culture (routine x 2)  BLOOD CULTURE X 2,   STAT      02/04/19 2139   02/04/19 1843  Urine culture  ONCE - STAT,   STAT     02/04/19 1843          Vitals/Pain Today's Vitals   02/04/19 1930 02/04/19 2214 02/04/19 2216 02/04/19 2218  BP: 126/80 118/79 123/79   Pulse: 79 76 74   Resp: (!) 26 20 20    Temp:   97.8 F (36.6 C)   TempSrc:   Oral   SpO2: 92%  98%   Weight:      Height:      PainSc:   0-No pain 0-No pain    Isolation Precautions Droplet and Contact precautions  Medications Medications  azithromycin (ZITHROMAX) 500 mg in sodium chloride 0.9 % 250 mL IVPB (has no administration in time range)  furosemide (LASIX) injection 40 mg (has no administration in time range)  albuterol (VENTOLIN HFA) 108 (90 Base)  MCG/ACT inhaler 2 puff (2 puffs Inhalation Given 02/04/19 1932)  cefTRIAXone (ROCEPHIN) 1 g in sodium chloride 0.9 % 100 mL IVPB (1 g Intravenous New Bag/Given 02/04/19 2212)    Mobility walks with device Moderate fall risk   Focused Assessments Pulmonary Assessment Handoff:  Lung sounds: Bilateral Breath Sounds: Expiratory wheezes, Inspiratory wheezes L Breath Sounds: Expiratory wheezes, Inspiratory wheezes R Breath Sounds: Inspiratory wheezes, Expiratory wheezes O2 Device: Room Air        R Recommendations: See Admitting Provider Note  Report given to:   Additional Notes:  Pt  A&O.  No confusion noted.  Denies any pain

## 2019-02-04 NOTE — ED Triage Notes (Signed)
Pt came in after being sent by PCP for shortness of breath for a month.  BLE edema noted

## 2019-02-04 NOTE — ED Notes (Signed)
Wife Brayon Biele[place] Updated

## 2019-02-04 NOTE — ED Provider Notes (Addendum)
[facility] Provider Note   CSN: [id number] Arrival date & time: 02/04/19  1817    History   Chief Complaint Chief Complaint  Patient presents with  . Shortness of Breath    HPI [name] is a 78 y.o. male.     The history is provided by the patient and medical records (paperwork from PCP). No language interpreter was used.  Shortness of Breath  Severity:  Severe Onset quality:  Gradual Duration:  2 weeks Timing:  Intermittent Progression:  Waxing and waning Chronicity:  Recurrent Relieved by:  Sitting up Worsened by:  Exertion (laying down) Ineffective treatments:  None tried Associated symptoms: chest pain, cough, sputum production and wheezing   Associated symptoms: no abdominal pain, no diaphoresis, no fever, no headaches, no neck pain, no rash and no vomiting   Risk factors: no family hx of DVT and no hx of PE/DVT     Past Medical History:  Diagnosis Date  . Allergy   . Atrial fibrillation ([facility])   . Cataract   . Chronic kidney disease    Renal Insuffiecny  . Colon polyps 2012   Colonoscopy  . Diabetes mellitus    type 2  . Diverticulosis 2012   Colonoscopy   . Hyperlipidemia   . Hypertension   . LBBB (left bundle branch block)   . Pneumonia 2015   3 times  . Stroke [facility]) 2015    Patient Active Problem List   Diagnosis Date Noted  . Aftercare following surgery of the circulatory system 08/09/2016  . Status post transmetatarsal amputation of foot, right ([place]) 08/03/2016  . 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 Cellulitis and abscess of foot 03/23/2016  . Diabetic infection of right foot ([place]) 03/23/2016  . Puncture wound of foot, right, complicated 70/62/3762  . Malnutrition of moderate degree 03/23/2016  . AKI (acute kidney injury) ([place])   . Tobacco use disorder 02/28/2016  . Acute CVA (cerebrovascular accident) ([place]) 02/27/2016  . DM2 (diabetes  mellitus, type 2) ([place]) 02/27/2016  . Anemia 02/27/2016  . Chronic systolic CHF (congestive heart failure) ([place]) 05/05/2015  . 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 Essential hypertension 07/16/2014  . Former smoker 04/21/2014  . Thrombotic stroke ([place]) 01/23/2014  . Dizziness 01/23/2014  . Diabetes ([place]) 01/23/2014  . Facial droop 01/23/2014  . Benign neoplasm of colon 08/25/2011  . CAD, NATIVE VESSEL 05/23/2010  . DIABETIC  RETINOPATHY 03/21/2010  . Hyperlipidemia 03/21/2010  . NEUROPATHY 03/21/2010  . HYPERTENSION 03/21/2010  . LBBB 03/21/2010  . 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 CERVICAL RADICULOPATHY 03/21/2010    Past Surgical History:  Procedure Laterality Date  . AMPUTATION Right 04/28/2016   Procedure: Right Transmetatarsal Amputation;  Surgeon: Newt Minion, MD;  Location: [facility];  Service: Orthopedics;  Laterality: Right;  . CATARACT EXTRACTION Bilateral    both eyes  . COLON SURGERY     to repair intestines as child  . COLONOSCOPY  10/20/2011   Procedure: COLONOSCOPY;  Surgeon: Inda Castle, MD;  Location: [facility];  Service: Endoscopy;  Laterality: N/A;  . FEMORAL-POPLITEAL BYPASS GRAFT Right 04/07/2016   Procedure: BYPASS GRAFT FEMORAL-POPLITEAL ARTERY-RIGHT;  Surgeon: Angelia Mould, MD;  Location: [facility];  Service: Vascular;  Laterality: Right;  . HOT HEMOSTASIS  10/20/2011   Procedure: HOT HEMOSTASIS (ARGON PLASMA COAGULATION/BICAP);  Surgeon: Inda Castle, MD;  [facility]: [facility];  Service: Endoscopy;  Laterality: N/A;  . KNEE ARTHROSCOPY Left    left  . NECK SURGERY     disectomy  . PERIPHERAL VASCULAR CATHETERIZATION Right 04/05/2016   Procedure: Lower Extremity Angiography;  Surgeon: Rosetta Posner, MD;  Location: [facility];  Service: Cardiovascular;  Laterality: Right;  . PERIPHERAL VASCULAR CATHETERIZATION N/A 04/05/2016   Procedure: Abdominal Aortogram;  Surgeon: Rosetta Posner, MD;   Location: [facility];  Service: Cardiovascular;  Laterality: N/A;  . VEIN HARVEST Right 04/07/2016   Procedure: VEIN HARVEST GREATER SAPHENOUS VIEN ;  Surgeon: Angelia Mould, MD;  Location: [facility];  Service: Vascular;  Laterality: Right;        Home Medications    Prior to Admission medications   Medication Sig Start Date End Date Taking? Authorizing Provider  carvedilol (COREG) 12.5 MG tablet TAKE 1 TABLET BY MOUTH  DAILY 09/12/16   Nahser, Wonda Cheng, MD  clopidogrel (PLAVIX) 75 MG tablet Take 75 mg by mouth daily.    [provider]  glimepiride (AMARYL) 1 MG tablet Take 0.5 mg by mouth 2 (two) times daily.     [provider]  HYDROcodone-acetaminophen (NORCO) 5-325 MG tablet Take 1 tablet by mouth every 6 (six) hours as needed. 04/28/16   Newt Minion, MD  Iron Combinations (NIFEREX) TABS Take 150 mg by mouth daily. Patient taking differently: Take 150 mg by mouth 2 (two) times daily.  02/29/16   Barton Dubois, MD  losartan (COZAAR) 100 MG tablet Take 100 mg by mouth daily.    [provider]  metFORMIN (GLUCOPHAGE) 1000 MG tablet Take 1,000 mg by mouth 2 (two) times daily with a meal.    [provider]  montelukast (SINGULAIR) 10 MG tablet Take 10 mg by mouth at bedtime.    [provider]  simvastatin (ZOCOR) 40 MG tablet Take 40 mg by mouth daily.     [provider]  spironolactone (ALDACTONE) 25 MG tablet Take 0.5 tablets (12.5 mg total) by mouth daily. 05/01/16   Nahser, Wonda Cheng, MD    Family History Family History  Problem Relation Age of Onset  . Diabetes Mellitus II Mother   . Heart disease Brother        before age 72  . Colon cancer Neg Hx   . Esophageal cancer Neg Hx   . Stomach cancer Neg Hx   . Anesthesia problems Neg Hx   . Hypotension Neg Hx   . Malignant hyperthermia Neg Hx   . Pseudochol deficiency Neg Hx     Social History Social History   Tobacco Use  . Smoking status: Former Smoker     Packs/day: 0.25    Years: 20.00    Pack years: 5.00    Types: Cigarettes  . Smokeless tobacco: Never Used  . Tobacco comment: Quit June 2017  Substance Use Topics  . Alcohol use: Yes    Alcohol/week: 0.0 standard drinks    Comment: rarely  . Drug use: No     Allergies   No known allergies   Review of Systems Review of Systems  Constitutional: Positive for chills and fatigue. Negative for diaphoresis and fever.  HENT: Negative for congestion.   Respiratory: Positive for cough, sputum production, chest tightness, shortness of breath and wheezing. Negative for stridor.   Cardiovascular: Positive for chest pain and leg swelling. Negative for palpitations.  Gastrointestinal: Negative for abdominal pain, constipation, diarrhea, nausea and vomiting.  Genitourinary: Negative  for dysuria and frequency.  Musculoskeletal: Negative for back pain, neck pain and neck stiffness.  Skin: Negative for rash and wound.  Neurological: Negative for headaches.  All other systems reviewed and are negative.    Physical Exam Updated Vital Signs BP (!) 142/87 (BP Location: Right Arm)   Pulse 80   Temp 97.8 F (36.6 C) (Oral)   Resp 20   Ht 5\' 10"  (1.778 m)   Wt 73 kg   SpO2 98%   BMI 23.10 kg/m   Physical Exam Vitals signs and nursing note reviewed.  Constitutional:      General: He is not in acute distress.    Appearance: He is well-developed. He is not ill-appearing, toxic-appearing or diaphoretic.  HENT:     Head: Normocephalic and atraumatic.  Eyes:     Conjunctiva/sclera: Conjunctivae normal.     Pupils: Pupils are equal, round, and reactive to light.  Neck:     Musculoskeletal: Neck supple.  Cardiovascular:     Rate and Rhythm: Normal rate and regular rhythm.     Heart sounds: No murmur.  Pulmonary:     Effort: Pulmonary effort is normal. Tachypnea present. No respiratory distress.     Breath sounds: Wheezing and rhonchi present.  Chest:     Chest wall: No tenderness.   Abdominal:     Palpations: Abdomen is soft.     Tenderness: There is no abdominal tenderness.  Musculoskeletal:     Right lower leg: He exhibits no tenderness. Edema present.     Left lower leg: He exhibits no tenderness. Edema present.  Skin:    General: Skin is warm and dry.     Capillary Refill: Capillary refill takes less than 2 seconds.     Findings: No rash.  Neurological:     General: No focal deficit present.     Mental Status: He is alert.  Psychiatric:        Mood and Affect: Mood normal.      ED Treatments / Results  Labs (all labs ordered are listed, but only abnormal results are displayed) Labs Reviewed  CBC WITH DIFFERENTIAL/PLATELET - Abnormal; Notable for the following components:      Result Value   RBC 4.09 (*)    Hemoglobin 12.0 (*)    RDW 17.6 (*)    All other components within normal limits  COMPREHENSIVE METABOLIC PANEL - Abnormal; Notable for the following components:   Chloride 114 (*)    CO2 20 (*)    Glucose, Bld 149 (*)    BUN 28 (*)    Creatinine, Ser 1.87 (*)    Calcium 8.8 (*)    Total Protein 5.6 (*)    Albumin 3.3 (*)    AST 49 (*)    ALT 45 (*)    GFR calc non Af Amer 34 (*)    GFR calc Af Amer 40 (*)    All other components within normal limits  BRAIN NATRIURETIC PEPTIDE - Abnormal; Notable for the following components:   B Natriuretic Peptide 2,222.4 (*)    All other components within normal limits  TROPONIN I - Abnormal; Notable for the following components:   Troponin I 0.03 (*)    All other components within normal limits  URINALYSIS, ROUTINE W REFLEX MICROSCOPIC - Abnormal; Notable for the following components:   Protein, ur 100 (*)    All other components within normal limits  SARS CORONAVIRUS 2 (HOSPITAL ORDER, [facility])  URINE CULTURE  CULTURE, BLOOD (ROUTINE X 2)  CULTURE, BLOOD (ROUTINE X 2)  LACTIC ACID, PLASMA  LACTIC ACID, PLASMA    EKG EKG Interpretation  Date/Time:  Tuesday  Feb 04 2019 18:29:39 EDT Ventricular Rate:  85 PR Interval:    QRS Duration: 150 QT Interval:  398 QTC Calculation: 474 R Axis:   55 Text Interpretation:  Sinus rhythm Borderline prolonged PR interval IVCD, consider atypical LBBB When cmpared to prior, t wave now inverted in 2, 3, AVF, and V4 No STEMI Confirmed by Antony Blackbird [phone]) on 02/04/2019 6:44:24 PM   Radiology Dg Chest Portable 1 View  Result Date: 02/04/2019 CLINICAL DATA:  78 year old male with shortness of breath. EXAM: PORTABLE CHEST 1 VIEW COMPARISON:  Chest radiograph dated 02/27/2016 FINDINGS: There is shallow inspiration with bibasilar atelectasis. Increased density at the left lung base may represent developing infiltrate. There is no pleural effusion or pneumothorax. The cardiac silhouette is within normal limits. No acute osseous pathology. IMPRESSION: Shallow inspiration with bibasilar atelectasis. Left lung base density concerning for pneumonia. Clinical correlation is recommended. Electronically Signed   By: Anner Crete M.D.   On: 02/04/2019 19:31    Procedures Procedures (including critical care time)  Medications Ordered in ED Medications  azithromycin (ZITHROMAX) 500 mg in sodium chloride 0.9 % 250 mL IVPB (has no administration in time range)  furosemide (LASIX) injection 40 mg (has no administration in time range)  albuterol (VENTOLIN HFA) 108 (90 Base) MCG/ACT inhaler 2 puff (2 puffs Inhalation Given 02/04/19 1932)  cefTRIAXone (ROCEPHIN) 1 g in sodium chloride 0.9 % 100 mL IVPB (1 g Intravenous New Bag/Given 02/04/19 2212)   [name] was evaluated in Emergency Department on 02/05/2019 for the symptoms described in the history of present illness. He was evaluated in the context of the global COVID-19 pandemic, which necessitated consideration that the patient might be at risk for infection with the SARS-CoV-2 virus that causes COVID-19. Institutional protocols and algorithms that pertain to the  evaluation of patients at risk for COVID-19 are in a state of rapid change based on information released by regulatory bodies including the CDC and federal and state organizations. These policies and algorithms were followed during the patient's care in the ED.   CRITICAL CARE Performed by: Gwenyth Allegra Nico Rogness Total critical care time: 35 minutes Critical care time was exclusive of separately billable procedures and treating other patients. Critical care was necessary to treat or prevent imminent or life-threatening deterioration. Critical care was time spent personally by me on the following activities: development of treatment plan with patient and/or surrogate as well as nursing, discussions with consultants, evaluation of patient's response to treatment, examination of patient, obtaining history from patient or surrogate, ordering and performing treatments and interventions, ordering and review of laboratory studies, ordering and review of radiographic studies, pulse oximetry and re-evaluation of patient's condition.   Initial Impression / Assessment and Plan / ED Course  I have reviewed the triage vital signs and the nursing notes.  Pertinent labs & imaging results that were available during my care of the patient were reviewed by me and considered in my medical decision making (see chart for details).        [name] is a 78 y.o. male with a past medical history significant for diabetes, hypertension, congestive heart failure, hyperlipidemia, CAD, prior stroke with left-sided deficits, atrial fibrillation not on blood thinners, and CKD who presents from PCP office with concern for decompensating CHF exacerbation.  Patient reports  that he has been having worsening peripheral edema and exertional shortness of breath.  He reports he has had fatigue.  He denies fevers but reports occasional chills.  He reports a occasional mild cough.  He denies any abdominal pain, nausea, vomiting,  diaphoresis, constipation, or diarrhea.  He does report that he has had chest pain specifically right-sided chest pain when he lays to the right.  He is unsure of his diuretic status and patient arrived with documentation from [facility] describing he has had some recent diuretic changes.  He reports that BNP was found to be over 3500 prompting him to be sent to the ED for likely IV diuresis and admission.  He denies history of COPD or asthma.   On arrival, patient's oxygen saturations are in the mid 90s on room air however he is sitting vertical.  He denies chest pain or shortness of breath while at rest however he reports if he starts to walk or exert himself he will get very short of breath and winded and have chest tightness.  On exam, patient has crackles and some rhonchi in his lungs.  Also some wheezing.  For the wheezing, he will be given some albuterol.  Clinically I am concerned about CHF exacerbation given the report that his BNP is very high and his peripheral edema and symptoms.  He will have repeat laboratory testing and imaging performed to confirm his work-up.  He will have coronavirus test sent.  Anticipate admission for IV diuresis.  9:39 PM Patient's work-up only revealed the elevated BNP as expected over 2000 but also showed concern for pneumonia on x-ray.  Troponin was also elevated.  Acute kidney injury was discovered.  Coronavirus test negative.  Patient will give be given antibiotics for community associated pneumonia will be admitted for diuresis and further management.   Final Clinical Impressions(s) / ED Diagnoses   Final diagnoses:  Community acquired pneumonia of left lower lobe of lung ([place])  Acute on chronic congestive heart failure, unspecified heart failure type [facility])    ED Discharge Orders    None      Clinical Impression: 1. Community acquired pneumonia of left lower lobe of lung ([place])   2. Acute on chronic congestive heart failure,  unspecified heart failure type ([place])     Disposition: Admit  This note was prepared with assistance of Dragon voice recognition software. Occasional wrong-word or sound-a-like substitutions may have occurred due to the inherent limitations of voice recognition software.     Shaquile Lutze, Gwenyth Allegra, MD 02/05/19 8003    Karl Erway, Gwenyth Allegra, MD 02/05/19 0028

## 2019-02-04 NOTE — ED Notes (Signed)
Advised rn [name] to please call wife [name] [phone] to update on patient status she also advised patient would like something to eat--[name]

## 2019-02-05 ENCOUNTER — Encounter (HOSPITAL_COMMUNITY): Payer: Self-pay | Admitting: Internal Medicine

## 2019-02-05 ENCOUNTER — Inpatient Hospital Stay (HOSPITAL_COMMUNITY): Payer: Medicare HMO

## 2019-02-05 DIAGNOSIS — N183 Chronic kidney disease, stage 3 unspecified: Secondary | ICD-10-CM | POA: Diagnosis present

## 2019-02-05 DIAGNOSIS — J181 Lobar pneumonia, unspecified organism: Secondary | ICD-10-CM

## 2019-02-05 DIAGNOSIS — I5041 Acute combined systolic (congestive) and diastolic (congestive) heart failure: Secondary | ICD-10-CM

## 2019-02-05 DIAGNOSIS — R609 Edema, unspecified: Secondary | ICD-10-CM

## 2019-02-05 DIAGNOSIS — I361 Nonrheumatic tricuspid (valve) insufficiency: Secondary | ICD-10-CM

## 2019-02-05 DIAGNOSIS — J189 Pneumonia, unspecified organism: Secondary | ICD-10-CM

## 2019-02-05 DIAGNOSIS — I509 Heart failure, unspecified: Secondary | ICD-10-CM

## 2019-02-05 DIAGNOSIS — N184 Chronic kidney disease, stage 4 (severe): Secondary | ICD-10-CM | POA: Diagnosis present

## 2019-02-05 DIAGNOSIS — E1149 Type 2 diabetes mellitus with other diabetic neurological complication: Secondary | ICD-10-CM

## 2019-02-05 LAB — MAGNESIUM: Magnesium: 1.6 mg/dL — ABNORMAL LOW (ref 1.7–2.4)

## 2019-02-05 LAB — COMPREHENSIVE METABOLIC PANEL
ALT: 41 U/L (ref 0–44)
AST: 31 U/L (ref 15–41)
Albumin: 3.4 g/dL — ABNORMAL LOW (ref 3.5–5.0)
Alkaline Phosphatase: 60 U/L (ref 38–126)
Anion gap: 9 (ref 5–15)
BUN: 29 mg/dL — ABNORMAL HIGH (ref 8–23)
CO2: 23 mmol/L (ref 22–32)
Calcium: 8.9 mg/dL (ref 8.9–10.3)
Chloride: 111 mmol/L (ref 98–111)
Creatinine, Ser: 1.98 mg/dL — ABNORMAL HIGH (ref 0.61–1.24)
GFR calc Af Amer: 37 mL/min — ABNORMAL LOW (ref >60)
GFR calc non Af Amer: 32 mL/min — ABNORMAL LOW (ref >60)
Glucose, Bld: 178 mg/dL — ABNORMAL HIGH (ref 70–99)
Potassium: 5.1 mmol/L (ref 3.5–5.1)
Sodium: 143 mmol/L (ref 135–145)
Total Bilirubin: 0.4 mg/dL (ref 0.3–1.2)
Total Protein: 5.8 g/dL — ABNORMAL LOW (ref 6.5–8.1)

## 2019-02-05 LAB — CREATININE, SERUM
Creatinine, Ser: 2.06 mg/dL — ABNORMAL HIGH (ref 0.61–1.24)
GFR calc Af Amer: 35 mL/min — ABNORMAL LOW (ref >60)
GFR calc non Af Amer: 31 mL/min — ABNORMAL LOW (ref >60)

## 2019-02-05 LAB — TROPONIN I
Troponin I: 0.03 ng/mL (ref <0.03)
Troponin I: 0.03 ng/mL (ref <0.03)
Troponin I: 0.03 ng/mL (ref <0.03)

## 2019-02-05 LAB — CBC WITH DIFFERENTIAL/PLATELET
Abs Immature Granulocytes: 0.02 10*3/uL (ref 0.00–0.07)
Basophils Absolute: 0 10*3/uL (ref 0.0–0.1)
Basophils Relative: 0 %
Eosinophils Absolute: 0.1 10*3/uL (ref 0.0–0.5)
Eosinophils Relative: 1 %
HCT: 39.1 % (ref 39.0–52.0)
Hemoglobin: 12.3 g/dL — ABNORMAL LOW (ref 13.0–17.0)
Immature Granulocytes: 0 %
Lymphocytes Relative: 18 %
Lymphs Abs: 1 10*3/uL (ref 0.7–4.0)
MCH: 29.6 pg (ref 26.0–34.0)
MCHC: 31.5 g/dL (ref 30.0–36.0)
MCV: 94.2 fL (ref 80.0–100.0)
Monocytes Absolute: 0.5 10*3/uL (ref 0.1–1.0)
Monocytes Relative: 8 %
Neutro Abs: 4.1 10*3/uL (ref 1.7–7.7)
Neutrophils Relative %: 73 %
Platelets: 191 10*3/uL (ref 150–400)
RBC: 4.15 MIL/uL — ABNORMAL LOW (ref 4.22–5.81)
RDW: 17.6 % — ABNORMAL HIGH (ref 11.5–15.5)
WBC: 5.7 10*3/uL (ref 4.0–10.5)
nRBC: 0 % (ref 0.0–0.2)

## 2019-02-05 LAB — GLUCOSE, CAPILLARY
Glucose-Capillary: 175 mg/dL — ABNORMAL HIGH (ref 70–99)
Glucose-Capillary: 100 mg/dL — ABNORMAL HIGH (ref 70–99)
Glucose-Capillary: 88 mg/dL (ref 70–99)
Glucose-Capillary: 194 mg/dL — ABNORMAL HIGH (ref 70–99)

## 2019-02-05 LAB — URINE CULTURE: Culture: NO GROWTH

## 2019-02-05 LAB — EXPECTORATED SPUTUM ASSESSMENT W GRAM STAIN, RFLX TO RESP C

## 2019-02-05 LAB — PROCALCITONIN: Procalcitonin: <0.1 ng/mL

## 2019-02-05 LAB — STREP PNEUMONIAE URINARY ANTIGEN: Strep Pneumo Urinary Antigen: NEGATIVE

## 2019-02-05 LAB — ECHOCARDIOGRAM COMPLETE
Height: 70 in
Weight: 2672 oz

## 2019-02-05 LAB — HIV ANTIBODY (ROUTINE TESTING W REFLEX): HIV Screen 4th Generation wRfx: NONREACTIVE

## 2019-02-05 LAB — TSH: TSH: 1.69 u[IU]/mL (ref 0.350–4.500)

## 2019-02-05 MED ORDER — SODIUM CHLORIDE 0.9 % IV SOLN
1.0000 g | INTRAVENOUS | Status: DC
  Administered 2019-02-05 – 2019-02-09 (×5): 1 g via INTRAVENOUS
  Filled 2019-02-05 (×6): qty 10

## 2019-02-05 MED ORDER — SIMVASTATIN 20 MG PO TABS
40.0000 mg | ORAL_TABLET | Freq: Every day | ORAL | Status: DC
  Administered 2019-02-05 – 2019-02-10 (×6): 40 mg via ORAL
  Filled 2019-02-05 (×6): qty 2

## 2019-02-05 MED ORDER — POLYSACCHARIDE IRON COMPLEX 150 MG PO CAPS
150.0000 mg | ORAL_CAPSULE | Freq: Two times a day (BID) | ORAL | Status: DC
  Administered 2019-02-05 – 2019-02-10 (×11): 150 mg via ORAL
  Filled 2019-02-05 (×11): qty 1

## 2019-02-05 MED ORDER — CLOPIDOGREL BISULFATE 75 MG PO TABS
75.0000 mg | ORAL_TABLET | Freq: Every day | ORAL | Status: DC
  Administered 2019-02-05 – 2019-02-07 (×3): 75 mg via ORAL
  Filled 2019-02-05 (×3): qty 1

## 2019-02-05 MED ORDER — ACETAMINOPHEN 325 MG PO TABS: 650.0000 mg | ORAL_TABLET | Freq: Four times a day (QID) | ORAL | Status: DC | PRN

## 2019-02-05 MED ORDER — ENOXAPARIN SODIUM 40 MG/0.4ML ~~LOC~~ SOLN
40.0000 mg | Freq: Every day | SUBCUTANEOUS | Status: DC
  Administered 2019-02-05 – 2019-02-10 (×6): 40 mg via SUBCUTANEOUS
  Filled 2019-02-05 (×6): qty 0.4

## 2019-02-05 MED ORDER — SODIUM CHLORIDE 0.9 % IV SOLN
500.0000 mg | Freq: Every day | INTRAVENOUS | Status: DC
  Administered 2019-02-05: 23:00:00 500 mg via INTRAVENOUS
  Filled 2019-02-05 (×3): qty 500

## 2019-02-05 MED ORDER — MONTELUKAST SODIUM 10 MG PO TABS
10.0000 mg | ORAL_TABLET | Freq: Every day | ORAL | Status: DC
  Administered 2019-02-05 – 2019-02-09 (×5): 10 mg via ORAL
  Filled 2019-02-05 (×5): qty 1

## 2019-02-05 MED ORDER — CARVEDILOL 25 MG PO TABS
25.0000 mg | ORAL_TABLET | Freq: Two times a day (BID) | ORAL | Status: DC
  Administered 2019-02-05 – 2019-02-07 (×5): 25 mg via ORAL
  Filled 2019-02-05 (×5): qty 1

## 2019-02-05 MED ORDER — ACETAMINOPHEN 650 MG RE SUPP: 650.0000 mg | Freq: Four times a day (QID) | RECTAL | Status: DC | PRN

## 2019-02-05 MED ORDER — LOSARTAN POTASSIUM 50 MG PO TABS: 50.0000 mg | ORAL_TABLET | Freq: Two times a day (BID) | ORAL | Status: DC

## 2019-02-05 MED ORDER — INSULIN ASPART 100 UNIT/ML ~~LOC~~ SOLN
0.0000 [IU] | Freq: Three times a day (TID) | SUBCUTANEOUS | Status: DC
  Administered 2019-02-05: 10:00:00 2 [IU] via SUBCUTANEOUS
  Administered 2019-02-06: 18:00:00 1 [IU] via SUBCUTANEOUS
  Administered 2019-02-06: 2 [IU] via SUBCUTANEOUS
  Administered 2019-02-06: 08:00:00 1 [IU] via SUBCUTANEOUS
  Administered 2019-02-08 (×2): 2 [IU] via SUBCUTANEOUS
  Administered 2019-02-09: 3 [IU] via SUBCUTANEOUS
  Administered 2019-02-09: 17:00:00 1 [IU] via SUBCUTANEOUS
  Administered 2019-02-10: 13:00:00 3 [IU] via SUBCUTANEOUS
  Administered 2019-02-10: 10:00:00 1 [IU] via SUBCUTANEOUS

## 2019-02-05 MED ORDER — FUROSEMIDE 10 MG/ML IJ SOLN
40.0000 mg | Freq: Two times a day (BID) | INTRAMUSCULAR | Status: DC
  Administered 2019-02-05 – 2019-02-06 (×3): 40 mg via INTRAVENOUS
  Filled 2019-02-05 (×3): qty 4

## 2019-02-05 NOTE — Progress Notes (Signed)
Lower ext. Venous duplex has been completed. Refer to [facility] under chart review to view preliminary results.   02/05/2019  9:51 AM Karris Deangelo, Bonnye Fava

## 2019-02-05 NOTE — Progress Notes (Signed)
PROGRESS NOTE    [name]  QZR:[id number] DOB: 1941-07-16 DOA: 02/04/2019 PCP: Prince Solian, MD   Brief Narrative: [name] is a 78 y.o. male with history of chronic systolic heart failure last EF measured in 2017 was 35 to 40% with grade 1 diastolic dysfunction, history of stroke diabetes mellitus type 2, chronic kidney disease stage III. Patient presented secondary to shortness of breath.    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 On Coreg, losartan spironolactone (not yet started) as an outpatient. Follows with cardiology as an outpateint. -Continue Lasix -Daily weights/strict in and out -Transthoracic Echocardiogram pending  Elevated troponin In setting of acute heart failure. Trending.  Right upper lobe pneumonia Undetectable procalcitonin and no WBC, but with productive, grey cough/sputum and associated chest discomfort in that area. -Continue Ceftriaxone/azithromycin  Diabetes mellitus, type 2 -Continue sliding scale  Acute kidney injury on CKD stage III Baseline creatinine of about 1.5 from three years ago. Up to 2.06 today. In setting of fluid overload -Continue lasix -Repeat BMP in AM  History of stroke -Continue Plavix and simvastatin  Chronic anemia Unknown etiology. Baseline of 9.2 from three years ago. Stable at 12. On iron supplementation -Outpatient follow-up   DVT prophylaxis: Lovenox Code Status:   Code Status: Full Code Family Communication: None at  bedside Disposition Plan: Discharge home in 1-2 days   Consultants:   None  Procedures:   None  Antimicrobials:  Ceftriaxone  Azithromycin    Subjective: Some dyspnea this morning. Worse with lying supine.  Objective: Vitals:   02/04/19 2216 02/04/19 2330 02/04/19 2346 02/05/19 0517  BP: 123/79  121/76 91/62  Pulse: 74  86 78  Resp: 20     Temp: 97.8 F (36.6 C)  (!) 97.4 F (36.3 C) 97.7 F (36.5 C)  TempSrc: Oral  Oral Oral  SpO2: 98%  97% 93%  Weight:  75.8 kg    Height:        Intake/Output Summary (Last 24 hours) at 02/05/2019 1015 Last data filed at 02/05/2019 0419 Gross per 24 hour  Intake 0 ml  Output 550 ml  Net -550 ml   Filed Weights   02/04/19 1841 02/04/19 2330  Weight: 73 kg 75.8 kg    Examination:  General exam: Appears calm and comfortable Respiratory system: inspiratory rhonchi bilaterally. Decreased breath sounds. Respiratory effort normal. Cardiovascular system: S1 & S2 heard, RRR. No murmurs, rubs, gallops or clicks. Gastrointestinal system: Abdomen is nondistended, soft and nontender. No organomegaly or masses felt. Normal bowel sounds heard. Central nervous system: Alert and oriented. No focal neurological deficits. Extremities: 2+ BLE edema. No calf tenderness Skin: No cyanosis. No rashes Psychiatry: Judgement and insight appear normal. Mood & affect appropriate.     Data Reviewed: I have personally reviewed following labs and imaging studies  CBC: Recent Labs  Lab 02/04/19 2007 02/05/19 0230  WBC 4.8 5.7  NEUTROABS 3.3 4.1  HGB 12.0* 12.3*  HCT 39.5 39.1  MCV 96.6 94.2  PLT 184 191  Basic Metabolic Panel: Recent Labs  Lab 02/04/19 2007 02/05/19 0230  NA 142 143  K 4.7 5.1  CL 114* 111  CO2 20* 23  GLUCOSE 149* 178*  BUN 28* 29*  CREATININE 1.87* 2.06*   1.98*  CALCIUM 8.8* 8.9  MG  --  1.6*   GFR: Estimated Creatinine Clearance: 32 mL/min (A) (by C-G formula based on SCr of 2.06 mg/dL (H)). Liver  Function Tests: Recent Labs  Lab 02/04/19 2007 02/05/19 0230  AST 49* 31  ALT 45* 41  ALKPHOS 60 60  BILITOT 0.6 0.4  PROT 5.6* 5.8*  ALBUMIN 3.3* 3.4*   No results for input(s): LIPASE, AMYLASE in the last 168 hours. No results for input(s): AMMONIA in the last 168 hours. Coagulation Profile: No results for input(s): INR, PROTIME in the last 168 hours. Cardiac Enzymes: Recent Labs  Lab 02/04/19 2007 02/05/19 0230 02/05/19 0736  TROPONINI 0.03* 0.03* 0.03*   BNP (last 3 results) No results for input(s): PROBNP in the last 8760 hours. HbA1C: No results for input(s): HGBA1C in the last 72 hours. CBG: Recent Labs  Lab 02/05/19 0845  GLUCAP 175*   Lipid Profile: No results for input(s): CHOL, HDL, LDLCALC, TRIG, CHOLHDL, LDLDIRECT in the last 72 hours. Thyroid Function Tests: Recent Labs    02/05/19 0230  TSH 1.690   Anemia Panel: No results for input(s): VITAMINB12, FOLATE, FERRITIN, TIBC, IRON, RETICCTPCT in the last 72 hours. Sepsis Labs: Recent Labs  Lab 02/04/19 2008 02/04/19 2200 02/05/19 0230  PROCALCITON  --   --  <0.10  LATICACIDVEN 1.2 1.0  --     Recent Results (from the past 240 hour(s))  SARS Coronavirus 2 (CEPHEID- Performed in [place] hospital lab), Hosp Order     Status: None   Collection Time: 02/04/19  7:30 PM  Result Value Ref Range Status   SARS Coronavirus 2 NEGATIVE NEGATIVE Final    Comment: (NOTE) If result is NEGATIVE SARS-CoV-2 target nucleic acids are NOT DETECTED. The SARS-CoV-2 RNA is generally detectable in upper and lower  respiratory specimens during the acute phase of infection. The lowest  concentration of SARS-CoV-2 viral copies this assay can detect is 250  copies / mL. A negative result does not preclude SARS-CoV-2 infection  and should not be used as the sole basis for treatment or other  patient management decisions.  A negative result may occur with  improper specimen collection / handling, submission of  specimen other  than nasopharyngeal swab, presence of viral mutation(s) within the  areas targeted by this assay, and inadequate number of viral copies  (<250 copies / mL). A negative result must be combined with clinical  observations, patient history, and epidemiological information. If result is POSITIVE SARS-CoV-2 target nucleic acids are DETECTED. The SARS-CoV-2 RNA is generally detectable in upper and lower  respiratory specimens dur ing the acute phase of infection.  Positive  results are indicative of active infection with SARS-CoV-2.  Clinical  correlation with patient history and other diagnostic information is  necessary to determine patient infection status.  Positive results do  not rule out bacterial infection or co-infection with other viruses. If result is PRESUMPTIVE POSTIVE SARS-CoV-2 nucleic acids MAY BE PRESENT.   A presumptive positive result was obtained on the submitted specimen  and confirmed on repeat testing.  While 2019 novel coronavirus  (SARS-CoV-2) nucleic acids may be present in the submitted sample  additional confirmatory testing may be necessary for epidemiological  and / or clinical  management purposes  to differentiate between  SARS-CoV-2 and other Sarbecovirus currently known to infect humans.  If clinically indicated additional testing with an alternate test  methodology [phone]) is advised. The SARS-CoV-2 RNA is generally  detectable in upper and lower respiratory sp ecimens during the acute  phase of infection. The expected result is Negative. Fact Sheet for Patients:  StrictlyIdeas.no Fact Sheet for Healthcare Providers: BankingDealers.co.za This test is not yet approved or cleared by the [place] FDA and has been authorized for detection and/or diagnosis of SARS-CoV-2 by FDA under an Emergency Use Authorization (EUA).  This EUA will remain in effect (meaning this test can be used) for the  duration of the COVID-19 declaration under Section 564(b)(1) of the Act, 21 U.S.C. section 360bbb-3(b)(1), unless the authorization is terminated or revoked sooner. Performed at [facility], [place] [address] [place], [place] [address]          Radiology Studies: Ct Chest Wo Contrast  Result Date: 02/05/2019 CLINICAL DATA:  79 year old male with shortness of breath. Negative for COVID-19 yesterday. Acute on chronic heart failure. Productive cough, query pneumonia. EXAM: CT CHEST WITHOUT CONTRAST TECHNIQUE: Multidetector CT imaging of the chest was performed following the standard protocol without IV contrast. COMPARISON:  Portable chest 512 and earlier. FINDINGS: Cardiovascular: Mild-to-moderate cardiomegaly with extensive coronary artery calcified atherosclerosis and/or stents. No pericardial effusion. Mild Calcified aortic atherosclerosis. Vascular patency is not evaluated in the absence of IV contrast. Mediastinum/Nodes: Negative noncontrast thoracic inlet. Mild reactive appearing mediastinal lymph nodes. Lungs/Pleura: Moderate bilateral layering pleural effusions with simple fluid density. Superimposed compressive atelectasis. Major airways are patent but there is a small volume of retained secretions in the right mainstem bronchus and bronchus intermedius on series 4, image 67. There is mild bilateral lower and middle lobe peribronchial thickening. There is non dependent atelectasis or consolidation in the lingula with air bronchograms on series 4, image 92. There is patchy and peripheral non dependent ground-glass opacity in the right upper lobe on series 4, image 30. There is similar subtle peribronchial ground-glass opacity in the upper lobe on image 46. Upper Abdomen: Negative visible noncontrast liver, spleen, pancreas, bowel. Nonspecific perinephric stranding. The visible renal collecting systems are nondilated. Musculoskeletal: No acute osseous abnormality identified. IMPRESSION: 1.  Moderate layering pleural effusions with compressive atelectasis. 2. Superimposed peribronchial consolidation in the lingula, as well as patchy and peripheral ground-glass opacity in the right upper lobe. These findings are nonspecific but suspicious for superimposed bronchopneumonia. 3. Mild to moderate cardiomegaly with extensive calcified coronary artery atherosclerosis. Electronically Signed   By: Genevie Ann M.D.   On: 02/05/2019 06:48   Dg Chest Portable 1 View  Result Date: 02/04/2019 CLINICAL DATA:  78 year old male with shortness of breath. EXAM: PORTABLE CHEST 1 VIEW COMPARISON:  Chest radiograph dated 02/27/2016 FINDINGS: There is shallow inspiration with bibasilar atelectasis. Increased density at the left lung base may represent developing infiltrate. There is no pleural effusion or pneumothorax. The cardiac silhouette is within normal limits. No acute osseous pathology. IMPRESSION: Shallow inspiration with bibasilar atelectasis. Left lung base density concerning for pneumonia. Clinical correlation is recommended. Electronically Signed   By: Anner Crete M.D.   On: 02/04/2019 19:31   Vas [place] Lower Extremity Venous (dvt)  Result Date: 02/05/2019  Lower Venous Study Indications: Edema. Other Indications: PVD, CAD. Performing Technologist: Oda Cogan RDMS, RVT  Examination Guidelines: A complete evaluation includes B-mode imaging, spectral Doppler, color Doppler, and power Doppler as needed of all accessible portions  of each vessel. Bilateral testing is considered an integral part of a complete examination. Limited examinations for reoccurring indications may be performed as noted.  +---------+---------------+---------+-----------+----------+-------+  RIGHT     Compressibility Phasicity Spontaneity Properties Summary  +---------+---------------+---------+-----------+----------+-------+  CFV       Full            Yes       Yes                              +---------+---------------+---------+-----------+----------+-------+  SFJ       Full                                                      +---------+---------------+---------+-----------+----------+-------+  FV Prox   Full                                                      +---------+---------------+---------+-----------+----------+-------+  FV Mid    Full                                                      +---------+---------------+---------+-----------+----------+-------+  FV Distal Full                                                      +---------+---------------+---------+-----------+----------+-------+  PFV       Full                                                      +---------+---------------+---------+-----------+----------+-------+  POP       Full            Yes       Yes                             +---------+---------------+---------+-----------+----------+-------+  PTV       Full                                                      +---------+---------------+---------+-----------+----------+-------+  PERO      Full                                                      +---------+---------------+---------+-----------+----------+-------+   +---------+---------------+---------+-----------+----------+-------+  LEFT      Compressibility Phasicity Spontaneity Properties Summary  +---------+---------------+---------+-----------+----------+-------+  CFV       Full            Yes       Yes                             +---------+---------------+---------+-----------+----------+-------+  SFJ       Full                                                      +---------+---------------+---------+-----------+----------+-------+  FV Prox   Full                                                      +---------+---------------+---------+-----------+----------+-------+  FV Mid    Full                                                      +---------+---------------+---------+-----------+----------+-------+  FV Distal Full                                                       +---------+---------------+---------+-----------+----------+-------+  PFV       Full                                                      +---------+---------------+---------+-----------+----------+-------+  POP       Full            Yes       Yes                             +---------+---------------+---------+-----------+----------+-------+  PTV       Full                                                      +---------+---------------+---------+-----------+----------+-------+  PERO      Full                                                      +---------+---------------+---------+-----------+----------+-------+    Summary: Right: There is no evidence of deep vein thrombosis in the lower extremity. No cystic structure found in the popliteal fossa. Left: There is no evidence of deep vein thrombosis in the lower extremity. No cystic structure found in the popliteal fossa.  *See table(s) above for measurements and observations.  Preliminary         Scheduled Meds:  carvedilol  25 mg Oral BID WC   clopidogrel  75 mg Oral Daily   enoxaparin (LOVENOX) injection  40 mg Subcutaneous Daily   furosemide  40 mg Intravenous Q12H   insulin aspart  0-9 Units Subcutaneous TID WC   iron polysaccharides  150 mg Oral BID   montelukast  10 mg Oral QHS   simvastatin  40 mg Oral Daily   Continuous Infusions:  azithromycin     cefTRIAXone (ROCEPHIN)  IV       LOS: 1 day     Cordelia Poche, MD [facility] 02/05/2019, 10:15 AM  If 7PM-7AM, please contact night-coverage [email or web address]

## 2019-02-05 NOTE — H&P (Addendum)
History and Physical    [name] JKK:[id number] DOB: Jul 01, 1941 DOA: 02/04/2019  PCP: Prince Solian, MD  Patient coming from: Home.  Chief Complaint: Shortness of breath.  HPI: [name] is a 78 y.o. male with history of chronic systolic heart failure last EF measured in 2017 was 35 to 40% with grade 1 diastolic dysfunction, history of stroke diabetes mellitus type 2, chronic kidney disease stage III presents to the ER because of increasing shortness of breath over the last 1 month.  Patient also noted increasing bilateral lower extremity edema.  Also has been exam productive cough denies any chest pain fever chills.  Denies any nausea vomiting or diarrhea.  Shortness of breath increased on exertion.  ED Course: In the ER chest x-ray shows left basilar opacity.  EKG shows sinus rhythm with LBBB.  Labs show creatinine of 1.87 troponin 0.03 BNP 2200 lactic acid 1.2 hemoglobin 12 platelets 184.  On exam patient has elevated JVD and bilateral lower extremity edema pitting up to the knees.  Was given Lasix 40 mg IV and admitted.  Since patient has productive cough and x-ray showing possibility of pneumonia was started on antibiotics.  Review of Systems: As per HPI, rest all negative.   Past Medical History:  Diagnosis Date  . Allergy   . Atrial fibrillation ([place])   . Cataract   . Chronic kidney disease    Renal Insuffiecny  . Colon polyps 2012   Colonoscopy  . Diabetes mellitus    type 2  . Diverticulosis 2012   Colonoscopy   . Hyperlipidemia   . Hypertension   . LBBB (left bundle branch block)   . Pneumonia 2015   3 times  . Stroke [facility]) 2015    Past Surgical History:  Procedure Laterality Date  . AMPUTATION Right 04/28/2016   Procedure: Right Transmetatarsal Amputation;  Surgeon: Newt Minion, MD;  Location: [place];  Service: Orthopedics;  Laterality: Right;  . CATARACT EXTRACTION Bilateral    both eyes  . COLON SURGERY     to repair intestines as child  .  COLONOSCOPY  10/20/2011   Procedure: COLONOSCOPY;  Surgeon: Inda Castle, MD;  Location: [facility];  Service: Endoscopy;  Laterality: N/A;  . FEMORAL-POPLITEAL BYPASS GRAFT Right 04/07/2016   Procedure: BYPASS GRAFT FEMORAL-POPLITEAL ARTERY-RIGHT;  Surgeon: Angelia Mould, MD;  Location: [place];  Service: Vascular;  Laterality: Right;  . HOT HEMOSTASIS  10/20/2011   Procedure: HOT HEMOSTASIS (ARGON PLASMA COAGULATION/BICAP);  Surgeon: Inda Castle, MD;  Location: [facility];  Service: Endoscopy;  Laterality: N/A;  . KNEE ARTHROSCOPY Left    left  . NECK SURGERY     disectomy  . PERIPHERAL VASCULAR CATHETERIZATION Right 04/05/2016   Procedure: Lower Extremity Angiography;  Surgeon: Rosetta Posner, MD;  Location: [place] [facility];  Service: Cardiovascular;  Laterality: Right;  . PERIPHERAL VASCULAR CATHETERIZATION N/A 04/05/2016   Procedure: Abdominal Aortogram;  Surgeon: Rosetta Posner, MD;  Location: [place] [facility];  Service: Cardiovascular;  Laterality: N/A;  . VEIN HARVEST Right 04/07/2016   Procedure: VEIN HARVEST GREATER SAPHENOUS VIEN ;  Surgeon: Angelia Mould, MD;  Location: [place];  Service: Vascular;  Laterality: Right;     reports that he has quit smoking. His smoking use included cigarettes. He has a 5.00 pack-year smoking history. He has never used smokeless tobacco. He reports current alcohol use. He reports that he does not use drugs.  No Known Allergies  Family  History  Problem Relation Age of Onset  . Diabetes Mellitus II Mother   . Heart disease Brother        before age 38  . Colon cancer Neg Hx   . Esophageal cancer Neg Hx   . Stomach cancer Neg Hx   . Anesthesia problems Neg Hx   . Hypotension Neg Hx   . Malignant hyperthermia Neg Hx   . Pseudochol deficiency Neg Hx     Prior to Admission medications   Medication Sig Start Date End Date Taking? Authorizing Provider  carvedilol (COREG) 12.5 MG tablet TAKE 1 TABLET BY MOUTH  DAILY Patient  taking differently: Take 25 mg by mouth 2 (two) times daily with a meal.  09/12/16  Yes Nahser, Wonda Cheng, MD  clopidogrel (PLAVIX) 75 MG tablet Take 75 mg by mouth daily.   Yes [provider]  glimepiride (AMARYL) 1 MG tablet Take 0.5 mg by mouth 2 (two) times daily.    Yes [provider]  losartan (COZAAR) 100 MG tablet Take 50 mg by mouth 2 (two) times a day.    Yes [provider]  metFORMIN (GLUCOPHAGE) 1000 MG tablet Take 1,000 mg by mouth 2 (two) times daily with a meal.   Yes [provider]  montelukast (SINGULAIR) 10 MG tablet Take 10 mg by mouth at bedtime.   Yes [provider]  simvastatin (ZOCOR) 40 MG tablet Take 40 mg by mouth daily.    Yes [provider]  HYDROcodone-acetaminophen (NORCO) 5-325 MG tablet Take 1 tablet by mouth every 6 (six) hours as needed. 04/28/16   Newt Minion, MD  Iron Combinations (NIFEREX) TABS Take 150 mg by mouth daily. Patient taking differently: Take 150 mg by mouth 2 (two) times daily.  02/29/16   Barton Dubois, MD  spironolactone (ALDACTONE) 25 MG tablet Take 0.5 tablets (12.5 mg total) by mouth daily. Patient not taking: Reported on 02/04/2019 05/01/16   Nahser, Wonda Cheng, MD    Physical Exam: Vitals:   02/04/19 1930 02/04/19 2214 02/04/19 2216 02/04/19 2346  BP: 126/80 118/79 123/79 121/76  Pulse: 79 76 74 86  Resp: (!) 26 20 20    Temp:   97.8 F (36.6 C) (!) 97.4 F (36.3 C)  TempSrc:   Oral Oral  SpO2: 92%  98% 97%  Weight:      Height:          Constitutional: Moderately built and nourished. Vitals:   02/04/19 1930 02/04/19 2214 02/04/19 2216 02/04/19 2346  BP: 126/80 118/79 123/79 121/76  Pulse: 79 76 74 86  Resp: (!) 26 20 20    Temp:   97.8 F (36.6 C) (!) 97.4 F (36.3 C)  TempSrc:   Oral Oral  SpO2: 92%  98% 97%  Weight:      Height:       Eyes: Anicteric no pallor. ENMT: No discharge from the ears eyes nose and mouth. Neck: No mass felt.  JVD elevated.  Respiratory: No rhonchi or crepitations. Cardiovascular: S1-S2 heard. Abdomen: Soft nontender bowel sounds present. Musculoskeletal: Bilateral lower extremity edema present. Skin: No rash. Neurologic: Alert awake oriented to time place and person.  Moves all extremities. Psychiatric: Appears normal.  Normal affect.   Labs on Admission: I have personally reviewed following labs and imaging studies  CBC: Recent Labs  Lab 02/04/19 2007  WBC 4.8  NEUTROABS 3.3  HGB 12.0*  HCT 39.5  MCV 96.6  PLT 270   Basic Metabolic Panel: Recent  Labs  Lab 02/04/19 2007  NA 142  K 4.7  CL 114*  CO2 20*  GLUCOSE 149*  BUN 28*  CREATININE 1.87*  CALCIUM 8.8*   GFR: Estimated Creatinine Clearance: 35.2 mL/min (A) (by C-G formula based on SCr of 1.87 mg/dL (H)). Liver Function Tests: Recent Labs  Lab 02/04/19 2007  AST 49*  ALT 45*  ALKPHOS 60  BILITOT 0.6  PROT 5.6*  ALBUMIN 3.3*   No results for input(s): LIPASE, AMYLASE in the last 168 hours. No results for input(s): AMMONIA in the last 168 hours. Coagulation Profile: No results for input(s): INR, PROTIME in the last 168 hours. Cardiac Enzymes: Recent Labs  Lab 02/04/19 2007  TROPONINI 0.03*   BNP (last 3 results) No results for input(s): PROBNP in the last 8760 hours. HbA1C: No results for input(s): HGBA1C in the last 72 hours. CBG: No results for input(s): GLUCAP in the last 168 hours. Lipid Profile: No results for input(s): CHOL, HDL, LDLCALC, TRIG, CHOLHDL, LDLDIRECT in the last 72 hours. Thyroid Function Tests: No results for input(s): TSH, T4TOTAL, FREET4, T3FREE, THYROIDAB in the last 72 hours. Anemia Panel: No results for input(s): VITAMINB12, FOLATE, FERRITIN, TIBC, IRON, RETICCTPCT in the last 72 hours. Urine analysis:    Component Value Date/Time   COLORURINE YELLOW 02/04/2019 1930   APPEARANCEUR CLEAR 02/04/2019 1930   LABSPEC 1.019 02/04/2019 1930   PHURINE 5.0 02/04/2019 1930   GLUCOSEU NEGATIVE  02/04/2019 1930   HGBUR NEGATIVE 02/04/2019 1930   BILIRUBINUR NEGATIVE 02/04/2019 [place] NEGATIVE 02/04/2019 1930   PROTEINUR 100 (A) 02/04/2019 1930   UROBILINOGEN 0.2 01/23/2014 1600   NITRITE NEGATIVE 02/04/2019 1930   LEUKOCYTESUR NEGATIVE 02/04/2019 1930   Sepsis Labs: @LABRCNTIP (procalcitonin:4,lacticidven:4) ) Recent Results (from the past 240 hour(s))  SARS Coronavirus 2 (CEPHEID- Performed in [place] hospital lab), Hosp Order     Status: None   Collection Time: 02/04/19  7:30 PM  Result Value Ref Range Status   SARS Coronavirus 2 NEGATIVE NEGATIVE Final    Comment: (NOTE) If result is NEGATIVE SARS-CoV-2 target nucleic acids are NOT DETECTED. The SARS-CoV-2 RNA is generally detectable in upper and lower  respiratory specimens during the acute phase of infection. The lowest  concentration of SARS-CoV-2 viral copies this assay can detect is 250  copies / mL. A negative result does not preclude SARS-CoV-2 infection  and should not be used as the sole basis for treatment or other  patient management decisions.  A negative result may occur with  improper specimen collection / handling, submission of specimen other  than nasopharyngeal swab, presence of viral mutation(s) within the  areas targeted by this assay, and inadequate number of viral copies  (<250 copies / mL). A negative result must be combined with clinical  observations, patient history, and epidemiological information. If result is POSITIVE SARS-CoV-2 target nucleic acids are DETECTED. The SARS-CoV-2 RNA is generally detectable in upper and lower  respiratory specimens dur ing the acute phase of infection.  Positive  results are indicative of active infection with SARS-CoV-2.  Clinical  correlation with patient history and other diagnostic information is  necessary to determine patient infection status.  Positive results do  not rule out bacterial infection or co-infection with other viruses. If  result is PRESUMPTIVE POSTIVE SARS-CoV-2 nucleic acids MAY BE PRESENT.   A presumptive positive result was obtained on the submitted specimen  and confirmed on repeat testing.  While 2019 novel coronavirus  (SARS-CoV-2) nucleic acids may  be present in the submitted sample  additional confirmatory testing may be necessary for epidemiological  and / or clinical management purposes  to differentiate between  SARS-CoV-2 and other Sarbecovirus currently known to infect humans.  If clinically indicated additional testing with an alternate test  methodology [id number]) is advised. The SARS-CoV-2 RNA is generally  detectable in upper and lower respiratory sp ecimens during the acute  phase of infection. The expected result is Negative. Fact Sheet for Patients:  StrictlyIdeas.no Fact Sheet for Healthcare Providers: BankingDealers.co.za This test is not yet approved or cleared by the [place] FDA and has been authorized for detection and/or diagnosis of SARS-CoV-2 by FDA under an Emergency Use Authorization (EUA).  This EUA will remain in effect (meaning this test can be used) for the duration of the COVID-19 declaration under Section 564(b)(1) of the Act, 21 U.S.C. section 360bbb-3(b)(1), unless the authorization is terminated or revoked sooner. Performed at [facility], [place] [address] [place], [place] [address]      Radiological Exams on Admission: Dg Chest Portable 1 View  Result Date: 02/04/2019 CLINICAL DATA:  78 year old male with shortness of breath. EXAM: PORTABLE CHEST 1 VIEW COMPARISON:  Chest radiograph dated 02/27/2016 FINDINGS: There is shallow inspiration with bibasilar atelectasis. Increased density at the left lung base may represent developing infiltrate. There is no pleural effusion or pneumothorax. The cardiac silhouette is within normal limits. No acute osseous pathology. IMPRESSION: Shallow inspiration with bibasilar  atelectasis. Left lung base density concerning for pneumonia. Clinical correlation is recommended. Electronically Signed   By: Anner Crete M.D.   On: 02/04/2019 19:31    EKG: Independently reviewed.  Sinus rhythm LBBB.  Assessment/Plan Principal Problem:   Acute on chronic congestive heart failure (HCC) Active Problems:   CAD, NATIVE VESSEL   Essential hypertension   DM2 (diabetes mellitus, type 2) ([place])   Community acquired pneumonia of left lower lobe of lung (HCC)   CKD (chronic kidney disease) stage 3, GFR 30-59 ml/min (HCC)   Acute CHF (congestive heart failure) ([place])    1. Acute on chronic systolic heart failure last EF measured in 2017 was 35 to 40% with grade 1 diastolic dysfunction.  Patient has been placed on Lasix 40 mg IV every 12.  Patient takes ARB but at this time patient blood pressure is in the low normal for now we will hold off ARB.  Patient was prescribed spironolactone but has not been taking it.  On Coreg.  Monitor intake output metabolic panel daily weights.  Will check Dopplers of the lower extremity.  Check 2D echo. 2. Mildly elevated troponin likely from CHF.  Denies any chest pain.  We will cycle cardiac markers.  On Plavix statins and Coreg. 3. Possible pneumonia on chest x-ray.  Will check procalcitonin and CT chest without contrast to further assess the opacity.  If CT chest and procalcitonin are unremarkable then may discontinue antibiotics. 4. Diabetes mellitus type 2 we will keep patient on sliding scale coverage. 5. Chronic kidney disease stage III -creatinine mildly elevated from baseline.  Holding ARB.  Closely monitor intake output and metabolic panel. 6. History of stroke on Plavix statins. 7. 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  Follow CBC.   DVT prophylaxis: Lovenox. Code Status: Full code. Family Communication: Discussed with patient. Disposition Plan: Home. Consults called: None. Admission status: Inpatient.   Rise Patience  MD [facility] Pager [phone].  If 7PM-7AM, please contact night-coverage [email or web address] Password [email or web address]  02/05/2019, 1:17  AM

## 2019-02-05 NOTE — Progress Notes (Signed)
  Echocardiogram 2D Echocardiogram has been performed.  [name] 02/05/2019, 11:48 AM

## 2019-02-06 DIAGNOSIS — I5023 Acute on chronic systolic (congestive) heart failure: Secondary | ICD-10-CM

## 2019-02-06 LAB — BASIC METABOLIC PANEL
Anion gap: 10 (ref 5–15)
BUN: 32 mg/dL — ABNORMAL HIGH (ref 8–23)
CO2: 21 mmol/L — ABNORMAL LOW (ref 22–32)
Calcium: 8.5 mg/dL — ABNORMAL LOW (ref 8.9–10.3)
Chloride: 113 mmol/L — ABNORMAL HIGH (ref 98–111)
Creatinine, Ser: 1.96 mg/dL — ABNORMAL HIGH (ref 0.61–1.24)
GFR calc Af Amer: 38 mL/min — ABNORMAL LOW (ref >60)
GFR calc non Af Amer: 32 mL/min — ABNORMAL LOW (ref >60)
Glucose, Bld: 155 mg/dL — ABNORMAL HIGH (ref 70–99)
Potassium: 4.6 mmol/L (ref 3.5–5.1)
Sodium: 144 mmol/L (ref 135–145)

## 2019-02-06 LAB — GLUCOSE, CAPILLARY
Glucose-Capillary: 127 mg/dL — ABNORMAL HIGH (ref 70–99)
Glucose-Capillary: 199 mg/dL — ABNORMAL HIGH (ref 70–99)
Glucose-Capillary: 134 mg/dL — ABNORMAL HIGH (ref 70–99)
Glucose-Capillary: 135 mg/dL — ABNORMAL HIGH (ref 70–99)

## 2019-02-06 MED ORDER — AZITHROMYCIN 250 MG PO TABS
500.0000 mg | ORAL_TABLET | Freq: Every day | ORAL | Status: DC
  Administered 2019-02-06 – 2019-02-09 (×4): 500 mg via ORAL
  Filled 2019-02-06 (×4): qty 2

## 2019-02-06 MED ORDER — ALBUTEROL SULFATE (2.5 MG/3ML) 0.083% IN NEBU
2.5000 mg | INHALATION_SOLUTION | Freq: Four times a day (QID) | RESPIRATORY_TRACT | Status: DC | PRN
  Administered 2019-02-06 (×2): 2.5 mg via RESPIRATORY_TRACT
  Filled 2019-02-06 (×2): qty 3

## 2019-02-06 MED ORDER — SODIUM CHLORIDE 0.9% FLUSH
3.0000 mL | Freq: Two times a day (BID) | INTRAVENOUS | Status: DC
  Administered 2019-02-06 – 2019-02-09 (×4): 3 mL via INTRAVENOUS

## 2019-02-06 MED ORDER — FUROSEMIDE 10 MG/ML IJ SOLN
40.0000 mg | Freq: Every day | INTRAMUSCULAR | Status: DC
  Administered 2019-02-07: 11:00:00 40 mg via INTRAVENOUS
  Filled 2019-02-06: qty 4

## 2019-02-06 NOTE — Progress Notes (Signed)
Patient ambulated approx 300 ft in the hallway on RA. O2 Sat remained above 97%. Pt without complaints and denied any SOB, dyspnea, lightheadedness or dizziness.

## 2019-02-06 NOTE — Progress Notes (Signed)
Pt stated he was feeling slightly sob although his O2 sats are 97%. I applied O2 for comfort @ 2L. Patient stated that he was feeling much better. I will continue to monitor

## 2019-02-06 NOTE — Consult Note (Signed)
Cardiology Consultation:   Patient ID: [name]; [id number]; 29-May-1941   Admit date: 02/04/2019 Date of Consult: 02/06/2019  Primary Care Provider: Prince Solian, MD Primary Cardiologist: Dr. Acie Fredrickson - last seen 01/2016 Primary Electrophysiologist:  None  Patient Profile:   [name] is a 78 y.o. male with a PMH of chronic combined CHF, non-ischemic cardiomyopathy, non-obstructive CAD on cath 2011, PAD s/p right fem-pop in 2017, DM type 2, HTN, HLD, stroke, CKD stage 3, tobacco abuse, who is being seen today for the evaluation of acute on chronic CHF at the request of Dr. Lonny Prude.  History of Present Illness:   Mr. Pettie was in his usual state of health until 2-3 months ago when he began experiencing DOE. His symptoms have progressed to the point of having SOB at rest. He notes orthopnea and LE edema. He denies weight gain and reports losing 100lb over the past year as a result of numerous illnesses (foot infection requiring partial amputation, stroke, and PNA). He denies experiencing any chest pain. Given worsening SOB patient presented to the ED for further evaluation.  He has not seen a cardiologist since 01/2016 at an outpatient visit with Dr. Acie Fredrickson. At that time he had had an improvement in his EF from 25-30% to 35-40% and was without cardiac complaints. His last ischemic evaluation was a NST in 2017 which was negative for ischemia. He underwent a cardiac catheterization in 2011 which showed moderate LAD stenosis, otherwise mild disease. Echo history: EF 40-45% in 2015 > 25-30% in 2016 > 35-40% in 2017.  He says he has not been taking any hear medications for a long time. No diuretics.  At the time of this evaluation he still feels SOB but is somewhat improved. He reports having a cough with grey sputum. He denies fevers.   Hospital course: Vitals stable. Labs notable for K 4.7, Mg 1.6, Cr 1.87>1.96, Hgb 12.0, PLT 184, Trop 0.03 x3, BNP 2222, TSH wnl, Urine strep/legionella neg,  lactate wnl, procal <0.1, COVID-19 negative. CXR with bibasilar atelectasis and LLL density c/f PNA. CT Chest with pleural effusions, patchy and peripheral ground-glass opacity in the RUL, and mild-mod cardiomegaly with extensive coronary artery atherosclerosis.  EKG with sinus rhythm with chronic LBBB, and 1st degree AV block. Echo this admission revealed EF 15-20%, G3DD, diffuse hypokinesis, moderately reduced RV systolic function, and moderate MR. Patient was started on antibiotics for PNA, IV lasix for CHF, and admitted to medicine. Cardiology asked to evaluate for CHF.   Past Medical History:  Diagnosis Date   Allergy    Atrial fibrillation [facility])    Cataract    Chronic kidney disease    Renal Insuffiecny   Colon polyps 2012   Colonoscopy   Diabetes mellitus    type 2   Diverticulosis 2012   Colonoscopy    Hyperlipidemia    Hypertension    LBBB (left bundle branch block)    Pneumonia 2015   3 times   Stroke [facility]) 2015    Past Surgical History:  Procedure Laterality Date   AMPUTATION Right 04/28/2016   Procedure: Right Transmetatarsal Amputation;  Surgeon: Newt Minion, MD;  Location: [place];  Service: Orthopedics;  Laterality: Right;   CATARACT EXTRACTION Bilateral    both eyes   COLON SURGERY     to repair intestines as child   COLONOSCOPY  10/20/2011   Procedure: COLONOSCOPY;  Surgeon: Inda Castle, MD;  Location: [facility];  Service: Endoscopy;  Laterality: N/A;  FEMORAL-POPLITEAL BYPASS GRAFT Right 04/07/2016   Procedure: BYPASS GRAFT FEMORAL-POPLITEAL ARTERY-RIGHT;  Surgeon: Angelia Mould, MD;  Location: [facility] OR;  Service: Vascular;  Laterality: Right;   HOT HEMOSTASIS  10/20/2011   Procedure: HOT HEMOSTASIS (ARGON PLASMA COAGULATION/BICAP);  Surgeon: Inda Castle, MD;  Location: [facility];  Service: Endoscopy;  Laterality: N/A;   KNEE ARTHROSCOPY Left    left   NECK SURGERY     disectomy   PERIPHERAL VASCULAR CATHETERIZATION Right  04/05/2016   Procedure: Lower Extremity Angiography;  Surgeon: Rosetta Posner, MD;  Location: [place] [facility];  Service: Cardiovascular;  Laterality: Right;   PERIPHERAL VASCULAR CATHETERIZATION N/A 04/05/2016   Procedure: Abdominal Aortogram;  Surgeon: Rosetta Posner, MD;  Location: [place] [facility];  Service: Cardiovascular;  Laterality: N/A;   VEIN HARVEST Right 04/07/2016   Procedure: VEIN HARVEST GREATER SAPHENOUS VIEN ;  Surgeon: Angelia Mould, MD;  Location: [place];  Service: Vascular;  Laterality: Right;     Home Medications:  Prior to Admission medications   Medication Sig Start Date End Date Taking? Authorizing Provider  carvedilol (COREG) 12.5 MG tablet TAKE 1 TABLET BY MOUTH  DAILY Patient taking differently: Take 25 mg by mouth 2 (two) times daily with a meal.  09/12/16  Yes Nahser, Wonda Cheng, MD  clopidogrel (PLAVIX) 75 MG tablet Take 75 mg by mouth daily.   Yes [provider]  glimepiride (AMARYL) 1 MG tablet Take 0.5 mg by mouth 2 (two) times daily.    Yes [provider]  Iron Combinations (NIFEREX) TABS Take 150 mg by mouth daily. Patient taking differently: Take 150 mg by mouth 2 (two) times daily.  02/29/16  Yes Barton Dubois, MD  losartan (COZAAR) 100 MG tablet Take 50 mg by mouth 2 (two) times a day.    Yes [provider]  metFORMIN (GLUCOPHAGE) 1000 MG tablet Take 1,000 mg by mouth 2 (two) times daily with a meal.   Yes [provider]  montelukast (SINGULAIR) 10 MG tablet Take 10 mg by mouth at bedtime.   Yes [provider]  simvastatin (ZOCOR) 40 MG tablet Take 40 mg by mouth daily.    Yes [provider]  spironolactone (ALDACTONE) 25 MG tablet Take 0.5 tablets (12.5 mg total) by mouth daily. Patient not taking: Reported on 02/04/2019 05/01/16   Nahser, Wonda Cheng, MD    Inpatient Medications: Scheduled Meds:  azithromycin  500 mg Oral Daily   carvedilol  25 mg Oral BID WC   clopidogrel  75 mg Oral  Daily   enoxaparin (LOVENOX) injection  40 mg Subcutaneous Daily   furosemide  40 mg Intravenous Q12H   insulin aspart  0-9 Units Subcutaneous TID WC   iron polysaccharides  150 mg Oral BID   montelukast  10 mg Oral QHS   simvastatin  40 mg Oral Daily   Continuous Infusions:  cefTRIAXone (ROCEPHIN)  IV Stopped (02/05/19 2230)   PRN Meds: acetaminophen **OR** acetaminophen, albuterol  Allergies:   No Known Allergies  Social History:   Social History   Socioeconomic History   Marital status: Married    Spouse name: Not on file   Number of children: 3   Years of education: Not on file   Highest education level: Not on file  Occupational History   Occupation: Retired  Scientist, product/process development strain: Not on file   Food insecurity:    Worry: Not on file  Inability: Not on file   Transportation needs:    Medical: Not on file    Non-medical: Not on file  Tobacco Use   Smoking status: Former Smoker    Packs/day: 0.25    Years: 20.00    Pack years: 5.00    Types: Cigarettes   Smokeless tobacco: Never Used   Tobacco comment: Quit June 2017  Substance and Sexual Activity   Alcohol use: Yes    Alcohol/week: 0.0 standard drinks    Comment: rarely   Drug use: No   Sexual activity: Not on file  Lifestyle   Physical activity:    Days per week: Not on file    Minutes per session: Not on file   Stress: Not on file  Relationships   Social connections:    Talks on phone: Not on file    Gets together: Not on file    Attends religious service: Not on file    Active member of club or organization: Not on file    Attends meetings of clubs or organizations: Not on file    Relationship status: Not on file   Intimate partner violence:    Fear of current or ex partner: Not on file    Emotionally abused: Not on file    Physically abused: Not on file    Forced sexual activity: Not on file  Other Topics Concern   Not on file  Social History  Narrative   Not on file    Family History:    Family History  Problem Relation Age of Onset   Diabetes Mellitus II Mother    Heart disease Brother        before age 67   Colon cancer Neg Hx    Esophageal cancer Neg Hx    Stomach cancer Neg Hx    Anesthesia problems Neg Hx    Hypotension Neg Hx    Malignant hyperthermia Neg Hx    Pseudochol deficiency Neg Hx      Review of Systems: [y] = yes, [ ] = no    General: Weight gain []; Weight loss Blue.Reese ]; Anorexia [ ]; Fatigue [y]; Fever [ ]; Chills [ ]; Weakness Blue.Reese ]   Cardiac: Chest pain/pressure [ ]; Resting SOB [ y]; Exertional SOB [y]; Orthopnea [ ]; Pedal Edema [y]; Palpitations Blue.Reese ]; Syncope [ ]; Presyncope [ ]; Paroxysmal nocturnal dyspnea[y ]   Pulmonary: Cough Blue.Reese ]; Wheezing[ ]; Hemoptysis[ ]; Sputum [ ]; Snoring [ ]   GI: Vomiting[ ]; Dysphagia[ ]; Melena[ ]; Hematochezia [ ]; Heartburn[ ]; Abdominal pain [ ]; Constipation [ ]; Diarrhea [ ]; BRBPR [ ]   GU: Hematuria[ ]; Dysuria [ ]; Nocturia[ ]  Vascular: Pain in legs with walking Blue.Reese ]; Pain in feet with lying flat Blue.Reese ]; Non-healing sores [ y]; Stroke [ ]; TIA [ ]; Slurred speech [ ];   Neuro: Headaches[ ]; Vertigo[ ]; Seizures[ ]; Paresthesias[ ];Blurred vision [ ]; Diplopia [ ]; Vision changes [ ]   Ortho/Skin: Arthritis [y]; Joint pain [y]; Muscle pain [ ]; Joint swelling [ ]; Back Pain [ ]; Rash [ ]   Psych: Depression[ ]; Anxiety[ ]   Heme: Bleeding problems [ ]; Clotting disorders [ ]; Anemia [ ]   Endocrine: Diabetes [ y]; Thyroid dysfunction[ ]   Physical Exam/Data:   Vitals:   02/05/19 1537 02/05/19 1851 02/05/19 2135 02/06/19 0603  BP: 122/76 130/84 112/76 116/82  Pulse: 81 74 71 73  Resp:      Temp: (!) 97.5 F (36.4 C)  98.3 F (36.8 C) 97.8 F (36.6 C)  TempSrc: Oral  Oral Oral  SpO2: 94%  94% 96%  Weight:    73.4 kg  Height:        Intake/Output Summary (Last 24 hours) at 02/06/2019 1045 Last data filed at 02/06/2019  0900 Gross per 24 hour  Intake 1310 ml  Output 1875 ml  Net -565 ml   Filed Weights   02/04/19 1841 02/04/19 2330 02/06/19 0603  Weight: 73 kg 75.8 kg 73.4 kg   Body mass index is 23.23 kg/m.   Physical exam per MD:l abnormalities noted Psych:  Normal affect   EKG:  The EKG was personally reviewed and demonstrates:  sinus rhythm with chronic LBBB (150 ms), and 1st degree AV block.  Telemetry:  Telemetry was personally reviewed and demonstrates:  Sinus 60-70s diabetic   Relevant CV Studies: Echocardiogram 02/05/2019: IMPRESSIONS    1. The cavity size was normal. There is mildly increased left ventricular wall thickness. Left ventricular diastolic Doppler parameters are consistent with restrictive filling. Elevated left ventricular end-diastolic pressure Left ventricular diffuse  hypokinesis, EF 15-20%.  2. The right ventricle has moderately reduced systolic function. The cavity was normal. There is no increase in right ventricular wall thickness.  3. Left atrial size was moderately dilated.  4. Left pleural effusion is present.  5. Mild thickening of the mitral valve leaflet. Mitral valve regurgitation is moderate by color flow Doppler, suspect functional MR. No evidence of mitral valve stenosis.  6. The aortic valve is tricuspid. Moderate calcification of the aortic valve. Aortic valve regurgitation is trivial by color flow Doppler. No stenosis of the aortic valve.  7. Trivial pericardial effusion is present.  8. The inferior vena cava was dilated in size with <50% respiratory variability. PA systolic pressure 45 mmHg.  NST 2017:  Nuclear stress EF: 34%.  There is a small defect of mild severity present in the basal inferior location. The defect is non-reversible.There is a small defect of moderate severity present in the apical inferior and apex location. The defect is non-reversible. No ischemia is noted.  This is a high risk study due to LV dysfunction and mildly  increased TID at 1.22.  The left ventricular ejection fraction is moderately decreased (30-44%).  Nondiagnostic EKG due to baseline EKG changes.  Cardiac catheterization 2011: Impression: - Moderate nonobstructive disease in the LAD artery with mild plaque disease in other vessels - Mild LV global systolic dysfunction.    Laboratory Data:  Chemistry Recent Labs  Lab 02/04/19 2007 02/05/19 0230 02/06/19 0449  NA 142 143 144  K 4.7 5.1 4.6  CL 114* 111 113*  CO2 20* 23 21*  GLUCOSE 149* 178* 155*  BUN 28* 29* 32*  CREATININE 1.87* 2.06*   1.98* 1.96*  CALCIUM 8.8* 8.9 8.5*  GFRNONAA 34* 31*   32* 32*  GFRAA 40* 35*   37* 38*  ANIONGAP _0 Recent Labs  Lab 02/04/19 2007 02/05/19 0230  PROT 5.6* 5.8*  ALBUMIN 3.3* 3.4*  AST 49* 31  ALT 45* 41  ALKPHOS 60 60  BILITOT 0.6 0.4   Hematology Recent Labs  Lab 02/04/19 2007 02/05/19 0230  WBC 4.8 5.7  RBC 4.09* 4.15*  HGB 12.0* 12.3*  HCT 39.5 39.1  MCV 96.6 94.2  MCH 29.3 29.6  MCHC 30.4 31.5  RDW 17.6* 17.6*  PLT 184 191  Cardiac Enzymes Recent Labs  Lab 02/04/19 2007 02/05/19 0230 02/05/19 0736 02/05/19 1437  TROPONINI 0.03* 0.03* 0.03* 0.03*   No results for input(s): TROPIPOC in the last 168 hours.  BNP Recent Labs  Lab 02/04/19 2008  BNP 2,222.4*    DDimer No results for input(s): DDIMER in the last 168 hours.  Radiology/Studies:  Ct Chest Wo Contrast  Result Date: 02/05/2019 CLINICAL DATA:  78 year old male with shortness of breath. Negative for COVID-19 yesterday. Acute on chronic heart failure. Productive cough, query pneumonia. EXAM: CT CHEST WITHOUT CONTRAST TECHNIQUE: Multidetector CT imaging of the chest was performed following the standard protocol without IV contrast. COMPARISON:  Portable chest 512 and earlier. FINDINGS: Cardiovascular: Mild-to-moderate cardiomegaly with extensive coronary artery calcified atherosclerosis and/or stents. No pericardial effusion. Mild Calcified  aortic atherosclerosis. Vascular patency is not evaluated in the absence of IV contrast. Mediastinum/Nodes: Negative noncontrast thoracic inlet. Mild reactive appearing mediastinal lymph nodes. Lungs/Pleura: Moderate bilateral layering pleural effusions with simple fluid density. Superimposed compressive atelectasis. Major airways are patent but there is a small volume of retained secretions in the right mainstem bronchus and bronchus intermedius on series 4, image 67. There is mild bilateral lower and middle lobe peribronchial thickening. There is non dependent atelectasis or consolidation in the lingula with air bronchograms on series 4, image 92. There is patchy and peripheral non dependent ground-glass opacity in the right upper lobe on series 4, image 30. There is similar subtle peribronchial ground-glass opacity in the upper lobe on image 46. Upper Abdomen: Negative visible noncontrast liver, spleen, pancreas, bowel. Nonspecific perinephric stranding. The visible renal collecting systems are nondilated. Musculoskeletal: No acute osseous abnormality identified. IMPRESSION: 1. Moderate layering pleural effusions with compressive atelectasis. 2. Superimposed peribronchial consolidation in the lingula, as well as patchy and peripheral ground-glass opacity in the right upper lobe. These findings are nonspecific but suspicious for superimposed bronchopneumonia. 3. Mild to moderate cardiomegaly with extensive calcified coronary artery atherosclerosis. Electronically Signed   By: Genevie Ann M.D.   On: 02/05/2019 06:48   Dg Chest Portable 1 View  Result Date: 02/04/2019 CLINICAL DATA:  78 year old male with shortness of breath. EXAM: PORTABLE CHEST 1 VIEW COMPARISON:  Chest radiograph dated 02/27/2016 FINDINGS: There is shallow inspiration with bibasilar atelectasis. Increased density at the left lung base may represent developing infiltrate. There is no pleural effusion or pneumothorax. The cardiac silhouette is  within normal limits. No acute osseous pathology. IMPRESSION: Shallow inspiration with bibasilar atelectasis. Left lung base density concerning for pneumonia. Clinical correlation is recommended. Electronically Signed   By: Anner Crete M.D.   On: 02/04/2019 19:31   Vas [place] Lower Extremity Venous (dvt)  Result Date: 02/05/2019  Lower Venous Study Indications: Edema. Other Indications: PVD, CAD. Performing Technologist: Oda Cogan RDMS, RVT  Examination Guidelines: A complete evaluation includes B-mode imaging, spectral Doppler, color Doppler, and power Doppler as needed of all accessible portions of each vessel. Bilateral testing is considered an integral part of a complete examination. Limited examinations for reoccurring indications may be performed as noted.  +---------+---------------+---------+-----------+----------+-------+  RIGHT     Compressibility Phasicity Spontaneity Properties Summary  +---------+---------------+---------+-----------+----------+-------+  CFV       Full            Yes       Yes                             +---------+---------------+---------+-----------+----------+-------+  SFJ  FV Prox   Full                                                      +---------+---------------+---------+-----------+----------+-------+  FV Mid    Full                                                      +---------+---------------+---------+-----------+----------+-------+  FV Distal Full                                                      +---------+---------------+---------+-----------+----------+-------+  PFV       Full                                                      +---------+---------------+---------+-----------+----------+-------+  POP       Full            Yes       Yes                              +---------+---------------+---------+-----------+----------+-------+  PTV       Full                                                      +---------+---------------+---------+-----------+----------+-------+  PERO      Full                                                      +---------+---------------+---------+-----------+----------+-------+   +---------+---------------+---------+-----------+----------+-------+  LEFT      Compressibility Phasicity Spontaneity Properties Summary  +---------+---------------+---------+-----------+----------+-------+  CFV       Full            Yes       Yes                             +---------+---------------+---------+-----------+----------+-------+  SFJ       Full                                                      +---------+---------------+---------+-----------+----------+-------+  FV Prox   Full                                                      +---------+---------------+---------+-----------+----------+-------+  FV Prox   Full                                                      +---------+---------------+---------+-----------+----------+-------+  FV Mid    Full                                                      +---------+---------------+---------+-----------+----------+-------+  FV Distal Full                                                      +---------+---------------+---------+-----------+----------+-------+  PFV       Full                                                      +---------+---------------+---------+-----------+----------+-------+  POP       Full            Yes       Yes                             +---------+---------------+---------+-----------+----------+-------+  PTV       Full                                                      +---------+---------------+---------+-----------+----------+-------+  PERO      Full                                                      +---------+---------------+---------+-----------+----------+-------+    Summary: Right: There is no evidence of deep vein thrombosis in the lower extremity. No cystic structure found in the popliteal fossa. Left: There is  no evidence of deep vein thrombosis in the lower extremity. No cystic structure found in the popliteal fossa.  *See table(s) above for measurements and observations. Electronically signed by Harold Barban MD on 02/05/2019 at 3:33:22 PM.    Final     Assessment and Plan:   1. Acute on chronic combined CHF: Patient with a history of non-ischemic cardiomyopathy with EF 25-30% in 2016, improved to 35-40% on echo in 2017. Now presenting with SOB and LE edema. BNP 2222. CT Chest with pleural effusions. Echo this admission revealed EF 15-20%, G3DD, and diffuse hypokinesis. He has been diuresing with IV lasix 27m BID with net -7132min the past 24 hours and -1.2L this admission. Weight 167lbs>161lbs. Trops 0.03 x3 is not consistent with ACS and EKG with chronic LBBB. Possible there is an ischemic component as patient noted to have mild to  moderate CAD in 2011. - Patient would benefit from a R/LHC to further evaluate CHF and coronary artery anatomy - Continue IV lasix  - Continue carvedilol - Consider entresto after catheterization if Cr improves - Continue to monitor strict I/O's and daily weights  2. Elevated troponin in patient with CAD: non-obstructive on cardiac cath in 2011. Trop trend is 0.03 x3 - not consistent with ACS. Possible he has had progression of disease which is contributing to #1. On plavix for history of stroke.  - Start aspirin - Continue statin and carvedilol - Anticipate R/LHC to further evaluate CHF and coronary artery anatomy  3. RUL PNA: seen on CT Chest. Urine strep/legionella negative. COVID-19 negative. Sputum cx pending. On IV antibiotics  - Continue management per primary team  4. AoCKD vs CKD: Cr peaked at 2.06, down to 1.96 today; labs 3 years ago with Cr 1.5. Possible this is new baseline - Continue to monitor Cr closely with diuresis  5. History of CVA: on plavix and statin - Continue plavix and statin  6. DM type 2: on ISS - Continue management per primary  team  7. Mitral regurgitation: moderate on echo this admission.  - Continue routine outpatient monitoring.    For questions or updates, please contact [place] Please consult [email or web address] for contact info under Cardiology/STEMI.   Signed, Abigail Butts, PA-C  02/06/2019 10:45 AM [phone]   Patient seen and examined with the above-signed Advanced Practice Provider and/or Housestaff. I personally reviewed laboratory data, imaging studies and relevant notes. I independently examined the patient and formulated the important aspects of the plan. I have edited the note to reflect any of my changes or salient points. I have personally discussed the plan with the patient and/or family.  78 y/o male with DM2, severe PAD, CKD3 and h/o NICM by cath in 2011 and LBBB.   Admitted with progressive HF with NYHA IV symptoms in the setting of medication non-compliance. EF further reduced on echo 15-20%. Denies CP but very limited by HF symptoms. Cr ~2.00  Exam Elderly thin male No resp difficulty HEENT: normal x for poor dentition Neck: supple. JVP to jaw Carotids 2+ bilat; no bruits. No lymphadenopathy or thryomegaly appreciated. Cor: PMI nondisplaced. Regular rate & rhythm. No rubs, gallops or murmurs. Lungs: clear Abdomen: soft, nontender, nondistended. No hepatosplenomegaly. No bruits or masses. Good bowel sounds. Extremities: no cyanosis, clubbing, rash, 2+ edema Right trans met amputation  Neuro: alert & orientedx3, cranial nerves grossly intact. moves all 4 extremities w/o difficulty. Affect pleasant  He has Class IV HF with volume overload and worsening LV function with EF 15-20%. Given CRFs concern for progressive CAD high on the list. Will need R/L cath tomorrow with limited contrasts given CKD. LBBB may also be playing a role and need to consider CRT-D. Continue IV lasix gently so as not to push kidneys too hard prior to cath.   Glori Bickers, MD  1:39 PM

## 2019-02-06 NOTE — Evaluation (Signed)
Physical Therapy Evaluation Patient Details Name: [name] MRN: [id number] DOB: 1940/11/19 Today's Date: 02/06/2019   History of Present Illness  Gregor Dershem is a 78 y.o. male with history of chronic systolic heart failure last EF measured in 2017 was 35 to 40% with grade 1 diastolic dysfunction, history of stroke, diabetes mellitus type 2, chronic kidney disease stage III. Patient presented secondary to shortness of breath. Work up includes Acute on chronic combined systolic and diastolic heart failure.  CATH pending.  Clinical Impression  Pt admitted with/for SOB due to [facility] heart failure.  Pt with noticeable dyspnea and need for rest and supervision during gait.Marland Kitchen  Pt currently limited functionally due to the problems listed below.  (see problems list.)  Pt will benefit from PT to maximize function and safety to be able to get home safely with available assist.     Follow Up Recommendations No PT follow up;Supervision - Intermittent    Equipment Recommendations  None recommended by PT    Recommendations for Other Services       Precautions / Restrictions Precautions Precautions: Fall      Mobility  Bed Mobility Overal bed mobility: Modified Independent                Transfers Overall transfer level: Needs assistance   Transfers: Sit to/from Stand Sit to Stand: Modified independent (Device/Increase time)            Ambulation/Gait Ambulation/Gait assistance: Supervision Gait Distance (Feet): 200 Feet Assistive device: None(occasional use of the rail.  Carrying the portable monitor) Gait Pattern/deviations: Step-through pattern Gait velocity: slower Gait velocity interpretation: <1.8 ft/sec, indicate of risk for recurrent falls General Gait Details: mildly unsteady overall with AD with some scissoring or mild stagger to regain balance.  Sats dropped to 88% with dyspnea during ambulation , but rebound nicely at rest.  Stairs            Wheelchair  Mobility    Modified Rankin (Stroke Patients Only)       Balance Overall balance assessment: Needs assistance   Sitting balance-Leahy Scale: Good       Standing balance-Leahy Scale: Fair Standing balance comment: limited due to transmet amp on the R foot                             Pertinent Vitals/Pain Pain Assessment: No/denies pain    Home Living Family/patient expects to be discharged to:: Private residence   Available Help at Discharge: Family;Available 24 hours/day Type of Home: House Home Access: Stairs to enter Entrance Stairs-Rails: Can reach both Entrance Stairs-Number of Steps: 6 Home Layout: Two level;Other (Comment);Able to live on main level with bedroom/bathroom(has a basement living area) Home Equipment: Gilford Rile - 2 wheels;Cane - single point;Shower seat      Prior Function Level of Independence: Independent;Independent with assistive device(s)         Comments: uses a cane at times outdoors when he feels his balance is off.     Hand Dominance        Extremity/Trunk Assessment   Upper Extremity Assessment Upper Extremity Assessment: Overall WFL for tasks assessed    Lower Extremity Assessment Lower Extremity Assessment: Overall WFL for tasks assessed(mild proximal weaknesses)       Communication   Communication: No difficulties  Cognition Arousal/Alertness: Awake/alert Behavior During Therapy: WFL for tasks assessed/performed Overall Cognitive Status: Within Functional Limits for tasks assessed  General Comments      Exercises     Assessment/Plan    PT Assessment Patient needs continued PT services  PT Problem List Decreased strength;Decreased activity tolerance;Decreased balance;Decreased mobility;Cardiopulmonary status limiting activity       PT Treatment Interventions Gait training;Stair training;Functional mobility training;Therapeutic activities;Balance  training;Patient/family education    PT Goals (Current goals can be found in the Care Plan section)  Acute Rehab PT Goals Patient Stated Goal: back home independent PT Goal Formulation: With patient Time For Goal Achievement: 02/20/19 Potential to Achieve Goals: Good    Frequency Min 3X/week   Barriers to discharge        Co-evaluation               AM-PAC PT "6 Clicks" Mobility  Outcome Measure Help needed turning from your back to your side while in a flat bed without using bedrails?: None Help needed moving from lying on your back to sitting on the side of a flat bed without using bedrails?: None Help needed moving to and from a bed to a chair (including a wheelchair)?: None Help needed standing up from a chair using your arms (e.g., wheelchair or bedside chair)?: None Help needed to walk in hospital room?: None Help needed climbing 3-5 steps with a railing? : A Little 6 Click Score: 23    End of Session   Activity Tolerance: Patient tolerated treatment well Patient left: in chair;with call bell/phone within reach Nurse Communication: Mobility status PT Visit Diagnosis: Unsteadiness on feet (R26.81);Difficulty in walking, not elsewhere classified (R26.2)    Time: 5974-1638 PT Time Calculation (min) (ACUTE ONLY): 34 min   Charges:   PT Evaluation $PT Eval Moderate Complexity: 1 Mod PT Treatments $Gait Training: 8-22 mins        02/06/2019  Donnella Sham, PT Acute Rehabilitation Services [phone]  (pager) [phone]  (office)  Tessie Fass Kejon Feild 02/06/2019, 2:23 PM

## 2019-02-06 NOTE — Progress Notes (Addendum)
PROGRESS NOTE    [name]  WLN:[id number] DOB: [birth year] DOA: 02/04/2019 PCP: Prince Solian, MD   Brief Narrative: [name] is a 78 y.o. male with history of chronic systolic heart failure last EF measured in 2017 was 35 to 40% with grade 1 diastolic dysfunction, history of stroke diabetes mellitus type 2, chronic kidney disease stage III. Patient presented secondary to shortness of breath.    Assessment & Plan:   Principal Problem:   Acute on chronic congestive heart failure (HCC) Active Problems:   CAD, NATIVE VESSEL   Essential hypertension   DM2 (diabetes mellitus, type 2) ([place])   Community acquired pneumonia of left lower lobe of lung (HCC)   CKD (chronic kidney disease) stage 3, GFR 30-59 ml/min (HCC)   Acute CHF (congestive heart failure) (HCC)   Acute on chronic combined systolic and heart failure Last EF of 35-40% with grade 1 diastolic dysfunction per 7408 Transthoracic Echocardiogram. On Coreg, losartan spironolactone (not yet started) as an outpatient. Transthoracic Echocardiogram from 5/14 significant for worsened EF of 15-20% with diffuseshypokinesis. No known history of heart cath. Last stress test in 2017 high risk but without ischemia. -Continue Lasix -Daily weights/strict in and out -Cardiology consult  Elevated troponin In setting of acute heart failure. Flat trend. No chest pain  Right upper lobe pneumonia Undetectable procalcitonin and no WBC, but with productive, grey cough/sputum and associated chest discomfort in that area. Blood cultures no growth to date. -Continue Ceftriaxone/azithromycin -Sputum culture pending  Diabetes mellitus, type 2 -Continue sliding scale  Acute kidney injury on CKD stage III Baseline creatinine of about 1.5 from three years ago. Up to 2.06 peak. In setting of fluid overload. Down to 1.96 today -Continue lasix -Daily BMP  History of stroke -Continue Plavix and simvastatin  Chronic anemia Unknown  etiology. Baseline of 9.2 from three years ago. Stable at 12. On iron supplementation -Outpatient follow-up   DVT prophylaxis: Lovenox Code Status:   Code Status: Full Code Family Communication: Wife on telephone Disposition Plan: Discharge home likely in 24 hours if continues to improve and pending cardiology recommendations   Consultants:   None  Procedures:   None  Antimicrobials:  Ceftriaxone  Azithromycin    Subjective: Some shortness of breath overnight. Improved this morning.  Objective: Vitals:   02/05/19 1537 02/05/19 1851 02/05/19 2135 02/06/19 0603  BP: 122/76 130/84 112/76 116/82  Pulse: 81 74 71 73  Resp:      Temp: (!) 97.5 F (36.4 C)  98.3 F (36.8 C) 97.8 F (36.6 C)  TempSrc: Oral  Oral Oral  SpO2: 94%  94% 96%  Weight:    73.4 kg  Height:        Intake/Output Summary (Last 24 hours) at 02/06/2019 0955 Last data filed at 02/06/2019 0900 Gross per 24 hour  Intake 1310 ml  Output 1875 ml  Net -565 ml   Filed Weights   02/04/19 1841 02/04/19 2330 02/06/19 0603  Weight: 73 kg 75.8 kg 73.4 kg    Examination:  General exam: Appears calm and comfortable Respiratory system: wheezing bilaterally. Respiratory effort normal. Cardiovascular system: S1 & S2 heard, RRR. No murmurs, rubs, gallops or clicks. Gastrointestinal system: Abdomen is nondistended, soft and nontender. No organomegaly or masses felt. Normal bowel sounds heard. Central nervous system: Alert and oriented. No focal neurological deficits. Extremities: 2+ BLE edema. No calf tenderness Skin: No cyanosis. No rashes Psychiatry: Judgement and insight appear normal. Mood & affect appropriate.      Data  Reviewed: I have personally reviewed following labs and imaging studies  CBC: Recent Labs  Lab 02/04/19 2007 02/05/19 0230  WBC 4.8 5.7  NEUTROABS 3.3 4.1  HGB 12.0* 12.3*  HCT 39.5 39.1  MCV 96.6 94.2  PLT 184 629   Basic Metabolic Panel: Recent Labs  Lab  02/04/19 2007 02/05/19 0230 02/06/19 0449  NA 142 143 144  K 4.7 5.1 4.6  CL 114* 111 113*  CO2 20* 23 21*  GLUCOSE 149* 178* 155*  BUN 28* 29* 32*  CREATININE 1.87* 2.06*   1.98* 1.96*  CALCIUM 8.8* 8.9 8.5*  MG  --  1.6*  --    GFR: Estimated Creatinine Clearance: 33.6 mL/min (A) (by C-G formula based on SCr of 1.96 mg/dL (H)). Liver Function Tests: Recent Labs  Lab 02/04/19 2007 02/05/19 0230  AST 49* 31  ALT 45* 41  ALKPHOS 60 60  BILITOT 0.6 0.4  PROT 5.6* 5.8*  ALBUMIN 3.3* 3.4*   No results for input(s): LIPASE, AMYLASE in the last 168 hours. No results for input(s): AMMONIA in the last 168 hours. Coagulation Profile: No results for input(s): INR, PROTIME in the last 168 hours. Cardiac Enzymes: Recent Labs  Lab 02/04/19 2007 02/05/19 0230 02/05/19 0736 02/05/19 1437  TROPONINI 0.03* 0.03* 0.03* 0.03*   BNP (last 3 results) No results for input(s): PROBNP in the last 8760 hours. HbA1C: No results for input(s): HGBA1C in the last 72 hours. CBG: Recent Labs  Lab 02/05/19 0845 02/05/19 1146 02/05/19 1616 02/05/19 2136 02/06/19 0748  GLUCAP 175* 100* 88 194* 127*   Lipid Profile: No results for input(s): CHOL, HDL, LDLCALC, TRIG, CHOLHDL, LDLDIRECT in the last 72 hours. Thyroid Function Tests: Recent Labs    02/05/19 0230  TSH 1.690   Anemia Panel: No results for input(s): VITAMINB12, FOLATE, FERRITIN, TIBC, IRON, RETICCTPCT in the last 72 hours. Sepsis Labs: Recent Labs  Lab 02/04/19 2008 02/04/19 2200 02/05/19 0230  PROCALCITON  --   --  <0.10  LATICACIDVEN 1.2 1.0  --     Recent Results (from the past 240 hour(s))  Urine culture     Status: None   Collection Time: 02/04/19  7:07 PM  Result Value Ref Range Status   Specimen Description URINE, RANDOM  Final   Special Requests NONE  Final   Culture   Final    NO GROWTH Performed at [facility], [address], [place], [place] [address]    Report Status 02/05/2019 FINAL   Final  SARS Coronavirus 2 (CEPHEID- Performed in [place] hospital lab), Hosp Order     Status: None   Collection Time: 02/04/19  7:30 PM  Result Value Ref Range Status   SARS Coronavirus 2 NEGATIVE NEGATIVE Final    Comment: (NOTE) If result is NEGATIVE SARS-CoV-2 target nucleic acids are NOT DETECTED. The SARS-CoV-2 RNA is generally detectable in upper and lower  respiratory specimens during the acute phase of infection. The lowest  concentration of SARS-CoV-2 viral copies this assay can detect is 250  copies / mL. A negative result does not preclude SARS-CoV-2 infection  and should not be used as the sole basis for treatment or other  patient management decisions.  A negative result may occur with  improper specimen collection / handling, submission of specimen other  than nasopharyngeal swab, presence of viral mutation(s) within the  areas targeted by this assay, and inadequate number of viral copies  (<250 copies / mL). A negative result must  be combined with clinical  observations, patient history, and epidemiological information. If result is POSITIVE SARS-CoV-2 target nucleic acids are DETECTED. The SARS-CoV-2 RNA is generally detectable in upper and lower  respiratory specimens dur ing the acute phase of infection.  Positive  results are indicative of active infection with SARS-CoV-2.  Clinical  correlation with patient history and other diagnostic information is  necessary to determine patient infection status.  Positive results do  not rule out bacterial infection or co-infection with other viruses. If result is PRESUMPTIVE POSTIVE SARS-CoV-2 nucleic acids MAY BE PRESENT.   A presumptive positive result was obtained on the submitted specimen  and confirmed on repeat testing.  While 2019 novel coronavirus  (SARS-CoV-2) nucleic acids may be present in the submitted sample  additional confirmatory testing may be necessary for epidemiological  and / or clinical management  purposes  to differentiate between  SARS-CoV-2 and other Sarbecovirus currently known to infect humans.  If clinically indicated additional testing with an alternate test  methodology [phone]) is advised. The SARS-CoV-2 RNA is generally  detectable in upper and lower respiratory sp ecimens during the acute  phase of infection. The expected result is Negative. Fact Sheet for Patients:  StrictlyIdeas.no Fact Sheet for Healthcare Providers: BankingDealers.co.za This test is not yet approved or cleared by the [place] FDA and has been authorized for detection and/or diagnosis of SARS-CoV-2 by FDA under an Emergency Use Authorization (EUA).  This EUA will remain in effect (meaning this test can be used) for the duration of the COVID-19 declaration under Section 564(b)(1) of the Act, 21 U.S.C. section 360bbb-3(b)(1), unless the authorization is terminated or revoked sooner. Performed at [facility], [place] [address] [place], [place] [address]   Blood culture (routine x 2)     Status: None (Preliminary result)   Collection Time: 02/04/19  9:30 PM  Result Value Ref Range Status   Specimen Description BLOOD RIGHT ARM  Final   Special Requests   Final    BOTTLES DRAWN AEROBIC AND ANAEROBIC Blood Culture results may not be optimal due to an excessive volume of blood received in culture bottles   Culture   Final    NO GROWTH < 24 HOURS Performed at [facility], [place] [address], [place], [place] [address]    Report Status PENDING  Incomplete  Blood culture (routine x 2)     Status: None (Preliminary result)   Collection Time: 02/04/19 10:02 PM  Result Value Ref Range Status   Specimen Description BLOOD RIGHT HAND  Final   Special Requests   Final    BOTTLES DRAWN AEROBIC ONLY Blood Culture adequate volume   Culture   Final    NO GROWTH < 24 HOURS Performed at [facility], [place] [address], [place], [place] [address]     Report Status PENDING  Incomplete  Expectorated sputum assessment w rflx to resp cult     Status: None   Collection Time: 02/05/19  2:46 PM  Result Value Ref Range Status   Specimen Description EXPECTORATED SPUTUM  Final   Special Requests NONE  Final   Sputum evaluation   Final    THIS SPECIMEN IS ACCEPTABLE FOR SPUTUM CULTURE Performed at [facility], [place] [address] [place], [place] [address]    Report Status 02/05/2019 FINAL  Final  Culture, respiratory     Status: None (Preliminary result)   Collection Time: 02/05/19  2:46 PM  Result Value Ref Range  Status   Specimen Description EXPECTORATED SPUTUM  Final   Special Requests NONE Reflexed from [id number]  Final   Gram Stain   Final    ABUNDANT WBC PRESENT, PREDOMINANTLY PMN RARE GRAM POSITIVE COCCI    Culture   Final    CULTURE REINCUBATED FOR BETTER GROWTH Performed at [facility], [place] [address] [place], [place] [address]    Report Status PENDING  Incomplete         Radiology Studies: Ct Chest Wo Contrast  Result Date: 02/05/2019 CLINICAL DATA:  78 year old male with shortness of breath. Negative for COVID-19 yesterday. Acute on chronic heart failure. Productive cough, query pneumonia. EXAM: CT CHEST WITHOUT CONTRAST TECHNIQUE: Multidetector CT imaging of the chest was performed following the standard protocol without IV contrast. COMPARISON:  Portable chest 512 and earlier. FINDINGS: Cardiovascular: Mild-to-moderate cardiomegaly with extensive coronary artery calcified atherosclerosis and/or stents. No pericardial effusion. Mild Calcified aortic atherosclerosis. Vascular patency is not evaluated in the absence of IV contrast. Mediastinum/Nodes: Negative noncontrast thoracic inlet. Mild reactive appearing mediastinal lymph nodes. Lungs/Pleura: Moderate bilateral layering pleural effusions with simple fluid density. Superimposed compressive atelectasis. Major airways are patent but there is a small volume of retained  secretions in the right mainstem bronchus and bronchus intermedius on series 4, image 67. There is mild bilateral lower and middle lobe peribronchial thickening. There is non dependent atelectasis or consolidation in the lingula with air bronchograms on series 4, image 92. There is patchy and peripheral non dependent ground-glass opacity in the right upper lobe on series 4, image 30. There is similar subtle peribronchial ground-glass opacity in the upper lobe on image 46. Upper Abdomen: Negative visible noncontrast liver, spleen, pancreas, bowel. Nonspecific perinephric stranding. The visible renal collecting systems are nondilated. Musculoskeletal: No acute osseous abnormality identified. IMPRESSION: 1. Moderate layering pleural effusions with compressive atelectasis. 2. Superimposed peribronchial consolidation in the lingula, as well as patchy and peripheral ground-glass opacity in the right upper lobe. These findings are nonspecific but suspicious for superimposed bronchopneumonia. 3. Mild to moderate cardiomegaly with extensive calcified coronary artery atherosclerosis. Electronically Signed   By: Genevie Ann M.D.   On: 02/05/2019 06:48   Dg Chest Portable 1 View  Result Date: 02/04/2019 CLINICAL DATA:  78 year old male with shortness of breath. EXAM: PORTABLE CHEST 1 VIEW COMPARISON:  Chest radiograph dated 02/27/2016 FINDINGS: There is shallow inspiration with bibasilar atelectasis. Increased density at the left lung base may represent developing infiltrate. There is no pleural effusion or pneumothorax. The cardiac silhouette is within normal limits. No acute osseous pathology. IMPRESSION: Shallow inspiration with bibasilar atelectasis. Left lung base density concerning for pneumonia. Clinical correlation is recommended. Electronically Signed   By: Anner Crete M.D.   On: 02/04/2019 19:31   Vas [place] Lower Extremity Venous (dvt)  Result Date: 02/05/2019  Lower Venous Study Indications: Edema. Other  Indications: PVD, CAD. Performing Technologist: Oda Cogan RDMS, RVT  Examination Guidelines: A complete evaluation includes B-mode imaging, spectral Doppler, color Doppler, and power Doppler as needed of all accessible portions of each vessel. Bilateral testing is considered an integral part of a complete examination. Limited examinations for reoccurring indications may be performed as noted.  +---------+---------------+---------+-----------+----------+-------+  RIGHT     Compressibility Phasicity Spontaneity Properties Summary  +---------+---------------+---------+-----------+----------+-------+  CFV       Full            Yes       Yes                             +---------+---------------+---------+-----------+----------+-------+  SFJ       Full                                                      +---------+---------------+---------+-----------+----------+-------+  FV Prox   Full                                                      +---------+---------------+---------+-----------+----------+-------+  FV Mid    Full                                                      +---------+---------------+---------+-----------+----------+-------+  FV Distal Full                                                      +---------+---------------+---------+-----------+----------+-------+  PFV       Full                                                      +---------+---------------+---------+-----------+----------+-------+  POP       Full            Yes       Yes                             +---------+---------------+---------+-----------+----------+-------+  PTV       Full                                                      +---------+---------------+---------+-----------+----------+-------+  PERO      Full                                                      +---------+---------------+---------+-----------+----------+-------+   +---------+---------------+---------+-----------+----------+-------+  LEFT       Compressibility Phasicity Spontaneity Properties Summary  +---------+---------------+---------+-----------+----------+-------+  CFV       Full            Yes       Yes                             +---------+---------------+---------+-----------+----------+-------+  SFJ       Full                                                      +---------+---------------+---------+-----------+----------+-------+  FV Prox   Full                                                      +---------+---------------+---------+-----------+----------+-------+  FV Mid    Full                                                      +---------+---------------+---------+-----------+----------+-------+  FV Distal Full                                                      +---------+---------------+---------+-----------+----------+-------+  PFV       Full                                                      +---------+---------------+---------+-----------+----------+-------+  POP       Full            Yes       Yes                             +---------+---------------+---------+-----------+----------+-------+  PTV       Full                                                      +---------+---------------+---------+-----------+----------+-------+  PERO      Full                                                      +---------+---------------+---------+-----------+----------+-------+    Summary: Right: There is no evidence of deep vein thrombosis in the lower extremity. No cystic structure found in the popliteal fossa. Left: There is no evidence of deep vein thrombosis in the lower extremity. No cystic structure found in the popliteal fossa.  *See table(s) above for measurements and observations. Electronically signed by Harold Barban MD on 02/05/2019 at 3:33:22 PM.    Final         Scheduled Meds:  azithromycin  500 mg Oral Daily   carvedilol  25 mg Oral BID WC   clopidogrel  75 mg Oral Daily   enoxaparin (LOVENOX) injection  40 mg  Subcutaneous Daily   furosemide  40 mg Intravenous Q12H   insulin aspart  0-9 Units Subcutaneous TID WC   iron polysaccharides  150 mg Oral BID   montelukast  10 mg Oral QHS   simvastatin  40 mg Oral Daily   Continuous Infusions:  cefTRIAXone (ROCEPHIN)  IV Stopped (02/05/19 2230)     LOS: 2 days  Cordelia Poche, MD [facility] 02/06/2019, 9:55 AM  If 7PM-7AM, please contact night-coverage [email or web address]

## 2019-02-06 NOTE — H&P (View-Only) (Signed)
Cardiology Consultation:   Patient ID: [name]; [id number]; [name] 28, 1942   Admit date: 02/04/2019 Date of Consult: 02/06/2019  Primary Care Provider: Prince Solian, MD Primary Cardiologist: Dr. Acie Fredrickson - last seen 01/2016 Primary Electrophysiologist:  None  Patient Profile:   [name] is a 78 y.o. male with a PMH of chronic combined CHF, non-ischemic cardiomyopathy, non-obstructive CAD on cath 2011, PAD s/p right fem-pop in 2017, DM type 2, HTN, HLD, stroke, CKD stage 3, tobacco abuse, who is being seen today for the evaluation of acute on chronic CHF at the request of Dr. Lonny Prude.  History of Present Illness:   [name] was in his usual state of health until 2-3 months ago when he began experiencing DOE. His symptoms have progressed to the point of having SOB at rest. He notes orthopnea and LE edema. He denies weight gain and reports losing 100lb over the past year as a result of numerous illnesses (foot infection requiring partial amputation, stroke, and PNA). He denies experiencing any chest pain. Given worsening SOB patient presented to the ED for further evaluation.  He has not seen a cardiologist since 01/2016 at an outpatient visit with Dr. Acie Fredrickson. At that time he had had an improvement in his EF from 25-30% to 35-40% and was without cardiac complaints. His last ischemic evaluation was a NST in 2017 which was negative for ischemia. He underwent a cardiac catheterization in 2011 which showed moderate LAD stenosis, otherwise mild disease. Echo history: EF 40-45% in 2015 > 25-30% in 2016 > 35-40% in 2017.  He says he has not been taking any hear medications for a long time. No diuretics.  At the time of this evaluation he still feels SOB but is somewhat improved. He reports having a cough with grey sputum. He denies fevers.   Hospital course: Vitals stable. Labs notable for K 4.7, Mg 1.6, Cr 1.87>1.96, Hgb 12.0, PLT 184, Trop 0.03 x3, BNP 2222, TSH wnl, Urine strep/legionella neg,  lactate wnl, procal <0.1, COVID-19 negative. CXR with bibasilar atelectasis and LLL density c/f PNA. CT Chest with pleural effusions, patchy and peripheral ground-glass opacity in the RUL, and mild-mod cardiomegaly with extensive coronary artery atherosclerosis.  EKG with sinus rhythm with chronic LBBB, and 1st degree AV block. Echo this admission revealed EF 15-20%, G3DD, diffuse hypokinesis, moderately reduced RV systolic function, and moderate MR. Patient was started on antibiotics for PNA, IV lasix for CHF, and admitted to medicine. Cardiology asked to evaluate for CHF.   Past Medical History:  Diagnosis Date   Allergy    Atrial fibrillation [facility])    Cataract    Chronic kidney disease    Renal Insuffiecny   Colon polyps 2012   Colonoscopy   Diabetes mellitus    type 2   Diverticulosis 2012   Colonoscopy    Hyperlipidemia    Hypertension    LBBB (left bundle branch block)    Pneumonia 2015   3 times   Stroke [facility]) 2015    Past Surgical History:  Procedure Laterality Date   AMPUTATION Right 04/28/2016   Procedure: Right Transmetatarsal Amputation;  Surgeon: Newt Minion, MD;  Location: [place];  Service: Orthopedics;  Laterality: Right;   CATARACT EXTRACTION Bilateral    both eyes   COLON SURGERY     to repair intestines as child   COLONOSCOPY  10/20/2011   Procedure: COLONOSCOPY;  Surgeon: Inda Castle, MD;  Location: [facility] ENDOSCOPY;  Service: Endoscopy;  Laterality: N/A;  FEMORAL-POPLITEAL BYPASS GRAFT Right 04/07/2016   Procedure: BYPASS GRAFT FEMORAL-POPLITEAL ARTERY-RIGHT;  Surgeon: Angelia Mould, MD;  Location[facility] OR;  Service: Vascular;  Laterality: Right;   HOT HEMOSTASIS  10/20/2011   Procedure: HOT HEMOSTASIS (ARGON PLASMA COAGULATION/BICAP);  Surgeon: Inda Castle, MD;  Location: [facility];  Service: Endoscopy;  Laterality: N/A;   KNEE ARTHROSCOPY Left    left   NECK SURGERY     disectomy   PERIPHERAL VASCULAR CATHETERIZATION Right  04/05/2016   Procedure: Lower Extremity Angiography;  Surgeon: Rosetta Posner, MD;  Location: [place] CV LAB;  Service: Cardiovascular;  Laterality: Right;   PERIPHERAL VASCULAR CATHETERIZATION N/A 04/05/2016   Procedure: Abdominal Aortogram;  Surgeon: Rosetta Posner, MD;  Location: [place] CV LAB;  Service: Cardiovascular;  Laterality: N/A;   VEIN HARVEST Right 04/07/2016   Procedure: VEIN HARVEST GREATER SAPHENOUS VIEN ;  Surgeon: Angelia Mould, MD;  Location: [place];  Service: Vascular;  Laterality: Right;     Home Medications:  Prior to Admission medications   Medication Sig Start Date End Date Taking? Authorizing Provider  carvedilol (COREG) 12.5 MG tablet TAKE 1 TABLET BY MOUTH  DAILY Patient taking differently: Take 25 mg by mouth 2 (two) times daily with a meal.  09/12/16  Yes Nahser, Wonda Cheng, MD  clopidogrel (PLAVIX) 75 MG tablet Take 75 mg by mouth daily.   Yes [provider]  glimepiride (AMARYL) 1 MG tablet Take 0.5 mg by mouth 2 (two) times daily.    Yes [provider]  Iron Combinations (NIFEREX) TABS Take 150 mg by mouth daily. Patient taking differently: Take 150 mg by mouth 2 (two) times daily.  02/29/16  Yes Barton Dubois, MD  losartan (COZAAR) 100 MG tablet Take 50 mg by mouth 2 (two) times a day.    Yes [provider]  metFORMIN (GLUCOPHAGE) 1000 MG tablet Take 1,000 mg by mouth 2 (two) times daily with a meal.   Yes [provider]  montelukast (SINGULAIR) 10 MG tablet Take 10 mg by mouth at bedtime.   Yes [provider]  simvastatin (ZOCOR) 40 MG tablet Take 40 mg by mouth daily.    Yes [provider]  spironolactone (ALDACTONE) 25 MG tablet Take 0.5 tablets (12.5 mg total) by mouth daily. Patient not taking: Reported on 02/04/2019 05/01/16   Nahser, Wonda Cheng, MD    Inpatient Medications: Scheduled Meds:  azithromycin  500 mg Oral Daily   carvedilol  25 mg Oral BID WC   clopidogrel  75 mg Oral  Daily   enoxaparin (LOVENOX) injection  40 mg Subcutaneous Daily   furosemide  40 mg Intravenous Q12H   insulin aspart  0-9 Units Subcutaneous TID WC   iron polysaccharides  150 mg Oral BID   montelukast  10 mg Oral QHS   simvastatin  40 mg Oral Daily   Continuous Infusions:  cefTRIAXone (ROCEPHIN)  IV Stopped (02/05/19 2230)   PRN Meds: acetaminophen **OR** acetaminophen, albuterol  Allergies:   No Known Allergies  Social History:   Social History   Socioeconomic History   Marital status: Married    Spouse name: Not on file   Number of children: 3   Years of education: Not on file   Highest education level: Not on file  Occupational History   Occupation: Retired  Scientist, product/process development strain: Not on file   Food insecurity:    Worry: Not on file  Not on file  °• Transportation needs:  °  Medical: Not on file  °  Non-medical: Not on file  °Tobacco Use  °• Smoking status: Former Smoker  °  Packs/day: 0.25  °  Years: 20.00  °  Pack years: 5.00  °  Types: Cigarettes  °• Smokeless tobacco: Never Used  °• Tobacco comment: Quit June 2017  °Substance and Sexual Activity  °• Alcohol use: Yes  °  Alcohol/week: 0.0 standard drinks  °  Comment: rarely  °• Drug use: No  °• Sexual activity: Not on file  °Lifestyle  °• Physical activity:  °  Days per week: Not on file  °  Minutes per session: Not on file  °• Stress: Not on file  °Relationships  °• Social connections:  °  Talks on phone: Not on file  °  Gets together: Not on file  °  Attends religious service: Not on file  °  Active member of club or organization: Not on file  °  Attends meetings of clubs or organizations: Not on file  °  Relationship status: Not on file  °• Intimate partner violence:  °  Fear of current or ex partner: Not on file  °  Emotionally abused: Not on file  °  Physically abused: Not on file  °  Forced sexual activity: Not on file  °Other Topics Concern  °• Not on file  °Social History  Narrative  °• Not on file  °  °Family History:   ° °Family History  °Problem Relation Age of Onset  °• Diabetes Mellitus II Mother   °• Heart disease Brother   °     before age 60  °• Colon cancer Neg Hx   °• Esophageal cancer Neg Hx   °• Stomach cancer Neg Hx   °• Anesthesia problems Neg Hx   °• Hypotension Neg Hx   °• Malignant hyperthermia Neg Hx   °• Pseudochol deficiency Neg Hx   °  ° °Review of Systems: [y] = yes, [ ] = no   °   °· General: Weight gain []; Weight loss [y ]; Anorexia [ ]; Fatigue [y]; Fever [ ]; Chills [ ]; Weakness [y ]   °· Cardiac: Chest pain/pressure [ ]; Resting SOB [ y]; Exertional SOB [y]; Orthopnea [ ]; Pedal Edema [y]; Palpitations [y ]; Syncope [ ]; Presyncope [ ]; Paroxysmal nocturnal dyspnea[y ]   °· Pulmonary: Cough [y ]; Wheezing[ ]; Hemoptysis[ ]; Sputum [ ]; Snoring [ ]   °· GI: Vomiting[ ]; Dysphagia[ ]; Melena[ ]; Hematochezia [ ]; Heartburn[ ]; Abdominal pain [ ]; Constipation [ ]; Diarrhea [ ]; BRBPR [ ]   °· GU: Hematuria[ ]; Dysuria [ ]; Nocturia[ ] °· Vascular: Pain in legs with walking [y ]; Pain in feet with lying flat [y ]; Non-healing sores [ y]; Stroke [ ]; TIA [ ]; Slurred speech [ ];   °· Neuro: Headaches[ ]; Vertigo[ ]; Seizures[ ]; Paresthesias[ ];Blurred vision [ ]; Diplopia [ ]; Vision changes [ ]   °· Ortho/Skin: Arthritis [y]; Joint pain [y]; Muscle pain [ ]; Joint swelling [ ]; Back Pain [ ]; Rash [ ]   °· Psych: Depression[ ]; Anxiety[ ]   °· Heme: Bleeding problems [ ]; Clotting disorders [ ]; Anemia [ ]   °· Endocrine: Diabetes [ y]; Thyroid dysfunction[ ] ° ° °Physical Exam/Data:  ° °Vitals:  ° 02/05/19 1537 02/05/19 1851 02/05/19 2135 02/06/19 0603  °BP: 122/76 130/84 112/76 116/82  °Pulse: 81 74 71 73  °Resp:      °  Resp:      Temp: (!) 97.5 F (36.4 C)  98.3 F (36.8 C) 97.8 F (36.6 C)  TempSrc: Oral  Oral Oral  SpO2: 94%  94% 96%  Weight:    73.4 kg  Height:        Intake/Output Summary (Last 24 hours) at 02/06/2019 1045 Last data filed at 02/06/2019  0900 Gross per 24 hour  Intake 1310 ml  Output 1875 ml  Net -565 ml   Filed Weights   02/04/19 1841 02/04/19 2330 02/06/19 0603  Weight: 73 kg 75.8 kg 73.4 kg   Body mass index is 23.23 kg/m.   Physical exam per MD:l abnormalities noted Psych:  Normal affect   EKG:  The EKG was personally reviewed and demonstrates:  sinus rhythm with chronic LBBB (150 ms), and 1st degree AV block.  Telemetry:  Telemetry was personally reviewed and demonstrates:  Sinus 60-70s diabetic   Relevant CV Studies: Echocardiogram 02/05/2019: IMPRESSIONS    1. The cavity size was normal. There is mildly increased left ventricular wall thickness. Left ventricular diastolic Doppler parameters are consistent with restrictive filling. Elevated left ventricular end-diastolic pressure Left ventricular diffuse  hypokinesis, EF 15-20%.  2. The right ventricle has moderately reduced systolic function. The cavity was normal. There is no increase in right ventricular wall thickness.  3. Left atrial size was moderately dilated.  4. Left pleural effusion is present.  5. Mild thickening of the mitral valve leaflet. Mitral valve regurgitation is moderate by color flow Doppler, suspect functional MR. No evidence of mitral valve stenosis.  6. The aortic valve is tricuspid. Moderate calcification of the aortic valve. Aortic valve regurgitation is trivial by color flow Doppler. No stenosis of the aortic valve.  7. Trivial pericardial effusion is present.  8. The inferior vena cava was dilated in size with <50% respiratory variability. PA systolic pressure 45 mmHg.  NST 2017:  Nuclear stress EF: 34%.  There is a small defect of mild severity present in the basal inferior location. The defect is non-reversible.There is a small defect of moderate severity present in the apical inferior and apex location. The defect is non-reversible. No ischemia is noted.  This is a high risk study due to LV dysfunction and mildly  increased TID at 1.22.  The left ventricular ejection fraction is moderately decreased (30-44%).  Nondiagnostic EKG due to baseline EKG changes.  Cardiac catheterization 2011: Impression: - Moderate nonobstructive disease in the LAD artery with mild plaque disease in other vessels - Mild LV global systolic dysfunction.    Laboratory Data:  Chemistry Recent Labs  Lab 02/04/19 2007 02/05/19 0230 02/06/19 0449  NA 142 143 144  K 4.7 5.1 4.6  CL 114* 111 113*  CO2 20* 23 21*  GLUCOSE 149* 178* 155*  BUN 28* 29* 32*  CREATININE 1.87* 2.06*   1.98* 1.96*  CALCIUM 8.8* 8.9 8.5*  GFRNONAA 34* 31*   32* 32*  GFRAA 40* 35*   37* 38*  ANIONGAP _0 Recent Labs  Lab 02/04/19 2007 02/05/19 0230  PROT 5.6* 5.8*  ALBUMIN 3.3* 3.4*  AST 49* 31  ALT 45* 41  ALKPHOS 60 60  BILITOT 0.6 0.4   Hematology Recent Labs  Lab 02/04/19 2007 02/05/19 0230  WBC 4.8 5.7  RBC 4.09* 4.15*  HGB 12.0* 12.3*  HCT 39.5 39.1  MCV 96.6 94.2  MCH 29.3 29.6  MCHC 30.4 31.5  RDW 17.6* 17.6*  PLT 184 191  02/04/19 °2007 02/05/19 °0230 02/05/19 °0736 02/05/19 °1437  °TROPONINI 0.03* 0.03* 0.03* 0.03*  ° No results for input(s): TROPIPOC in the last 168 hours.  °BNP °Recent Labs  °Lab 02/04/19 °2008  °BNP 2,222.4*  °  °DDimer No results for input(s): DDIMER in the last 168 hours. ° °Radiology/Studies:  °Ct Chest Wo Contrast ° °Result Date: 02/05/2019 °CLINICAL DATA:  77-year-old male with shortness of breath. Negative for COVID-19 yesterday. Acute on chronic heart failure. Productive cough, query pneumonia. EXAM: CT CHEST WITHOUT CONTRAST TECHNIQUE: Multidetector CT imaging of the chest was performed following the standard protocol without IV contrast. COMPARISON:  Portable chest 512 and earlier. FINDINGS: Cardiovascular: Mild-to-moderate cardiomegaly with extensive coronary artery calcified atherosclerosis and/or stents. No pericardial effusion. Mild Calcified  aortic atherosclerosis. Vascular patency is not evaluated in the absence of IV contrast. Mediastinum/Nodes: Negative noncontrast thoracic inlet. Mild reactive appearing mediastinal lymph nodes. Lungs/Pleura: Moderate bilateral layering pleural effusions with simple fluid density. Superimposed compressive atelectasis. Major airways are patent but there is a small volume of retained secretions in the right mainstem bronchus and bronchus intermedius on series 4, image 67. There is mild bilateral lower and middle lobe peribronchial thickening. There is non dependent atelectasis or consolidation in the lingula with air bronchograms on series 4, image 92. There is patchy and peripheral non dependent ground-glass opacity in the right upper lobe on series 4, image 30. There is similar subtle peribronchial ground-glass opacity in the upper lobe on image 46. Upper Abdomen: Negative visible noncontrast liver, spleen, pancreas, bowel. Nonspecific perinephric stranding. The visible renal collecting systems are nondilated. Musculoskeletal: No acute osseous abnormality identified. IMPRESSION: 1. Moderate layering pleural effusions with compressive atelectasis. 2. Superimposed peribronchial consolidation in the lingula, as well as patchy and peripheral ground-glass opacity in the right upper lobe. These findings are nonspecific but suspicious for superimposed bronchopneumonia. 3. Mild to moderate cardiomegaly with extensive calcified coronary artery atherosclerosis. Electronically Signed   By: H  Hall M.D.   On: 02/05/2019 06:48  ° °Dg Chest Portable 1 View ° °Result Date: 02/04/2019 °CLINICAL DATA:  77-year-old male with shortness of breath. EXAM: PORTABLE CHEST 1 VIEW COMPARISON:  Chest radiograph dated 02/27/2016 FINDINGS: There is shallow inspiration with bibasilar atelectasis. Increased density at the left lung base may represent developing infiltrate. There is no pleural effusion or pneumothorax. The cardiac silhouette is  within normal limits. No acute osseous pathology. IMPRESSION: Shallow inspiration with bibasilar atelectasis. Left lung base density concerning for pneumonia. Clinical correlation is recommended. Electronically Signed   By: Arash  Radparvar M.D.   On: 02/04/2019 19:31  ° °Vas Us Lower Extremity Venous (dvt) ° °Result Date: 02/05/2019 ° Lower Venous Study Indications: Edema. Other Indications: PVD, CAD. Performing Technologist: Rita Sturdivant RDMS, RVT  Examination Guidelines: A complete evaluation includes B-mode imaging, spectral Doppler, color Doppler, and power Doppler as needed of all accessible portions of each vessel. Bilateral testing is considered an integral part of a complete examination. Limited examinations for reoccurring indications may be performed as noted.  +---------+---------------+---------+-----------+----------+-------+  RIGHT     Compressibility Phasicity Spontaneity Properties Summary  +---------+---------------+---------+-----------+----------+-------+  CFV       Full            Yes       Yes                             +---------+---------------+---------+-----------+----------+-------+  SFJ       Full                                                      +---------+---------------+---------+-----------+----------+-------+    FV Prox   Full                                                      +---------+---------------+---------+-----------+----------+-------+  FV Mid    Full                                                      +---------+---------------+---------+-----------+----------+-------+  FV Distal Full                                                      +---------+---------------+---------+-----------+----------+-------+  PFV       Full                                                      +---------+---------------+---------+-----------+----------+-------+  POP       Full            Yes       Yes                              +---------+---------------+---------+-----------+----------+-------+  PTV       Full                                                      +---------+---------------+---------+-----------+----------+-------+  PERO      Full                                                      +---------+---------------+---------+-----------+----------+-------+   +---------+---------------+---------+-----------+----------+-------+  LEFT      Compressibility Phasicity Spontaneity Properties Summary  +---------+---------------+---------+-----------+----------+-------+  CFV       Full            Yes       Yes                             +---------+---------------+---------+-----------+----------+-------+  SFJ       Full                                                      +---------+---------------+---------+-----------+----------+-------+  FV Prox   Full                                                      +---------+---------------+---------+-----------+----------+-------+  FV Prox   Full                                                      +---------+---------------+---------+-----------+----------+-------+  FV Mid    Full                                                      +---------+---------------+---------+-----------+----------+-------+  FV Distal Full                                                      +---------+---------------+---------+-----------+----------+-------+  PFV       Full                                                      +---------+---------------+---------+-----------+----------+-------+  POP       Full            Yes       Yes                             +---------+---------------+---------+-----------+----------+-------+  PTV       Full                                                      +---------+---------------+---------+-----------+----------+-------+  PERO      Full                                                      +---------+---------------+---------+-----------+----------+-------+    Summary: Right: There is no evidence of deep vein thrombosis in the lower extremity. No cystic structure found in the popliteal fossa. Left: There is  no evidence of deep vein thrombosis in the lower extremity. No cystic structure found in the popliteal fossa.  *See table(s) above for measurements and observations. Electronically signed by Harold Barban MD on 02/05/2019 at 3:33:22 PM.    Final     Assessment and Plan:   1. Acute on chronic combined CHF: Patient with a history of non-ischemic cardiomyopathy with EF 25-30% in 2016, improved to 35-40% on echo in 2017. Now presenting with SOB and LE edema. BNP 2222. CT Chest with pleural effusions. Echo this admission revealed EF 15-20%, G3DD, and diffuse hypokinesis. He has been diuresing with IV lasix 27m BID with net -7132min the past 24 hours and -1.2L this admission. Weight 167lbs>161lbs. Trops 0.03 x3 is not consistent with ACS and EKG with chronic LBBB. Possible there is an ischemic component as patient noted to have mild to  moderate CAD in 2011. - Patient would benefit from a R/LHC to further evaluate CHF and coronary artery anatomy - Continue IV lasix  - Continue carvedilol - Consider entresto after catheterization if Cr improves - Continue to monitor strict I/O's and daily weights  2. Elevated troponin in patient with CAD: non-obstructive on cardiac cath in 2011. Trop trend is 0.03 x3 - not consistent with ACS. Possible he has had progression of disease which is contributing to #1. On plavix for history of stroke.  - Start aspirin - Continue statin and carvedilol - Anticipate R/LHC to further evaluate CHF and coronary artery anatomy  3. RUL PNA: seen on CT Chest. Urine strep/legionella negative. COVID-19 negative. Sputum cx pending. On IV antibiotics  - Continue management per primary team  4. AoCKD vs CKD: Cr peaked at 2.06, down to 1.96 today; labs 3 years ago with Cr 1.5. Possible this is new baseline - Continue to monitor Cr closely with diuresis  5. History of CVA: on plavix and statin - Continue plavix and statin  6. DM type 2: on ISS - Continue management per primary  team  7. Mitral regurgitation: moderate on echo this admission.  - Continue routine outpatient monitoring.    For questions or updates, please contact [place] Please consult [email or web address] for contact info under Cardiology/STEMI.   Signed, Abigail Butts, PA-C  02/06/2019 10:45 AM [phone]   Patient seen and examined with the above-signed Advanced Practice Provider and/or Housestaff. I personally reviewed laboratory data, imaging studies and relevant notes. I independently examined the patient and formulated the important aspects of the plan. I have edited the note to reflect any of my changes or salient points. I have personally discussed the plan with the patient and/or family.  78 y/o male with DM2, severe PAD, CKD3 and h/o NICM by cath in 2011 and LBBB.   Admitted with progressive HF with NYHA IV symptoms in the setting of medication non-compliance. EF further reduced on echo 15-20%. Denies CP but very limited by HF symptoms. Cr ~2.00  Exam Elderly thin male No resp difficulty HEENT: normal x for poor dentition Neck: supple. JVP to jaw Carotids 2+ bilat; no bruits. No lymphadenopathy or thryomegaly appreciated. Cor: PMI nondisplaced. Regular rate & rhythm. No rubs, gallops or murmurs. Lungs: clear Abdomen: soft, nontender, nondistended. No hepatosplenomegaly. No bruits or masses. Good bowel sounds. Extremities: no cyanosis, clubbing, rash, 2+ edema Right trans met amputation  Neuro: alert & orientedx3, cranial nerves grossly intact. moves all 4 extremities w/o difficulty. Affect pleasant  He has Class IV HF with volume overload and worsening LV function with EF 15-20%. Given CRFs concern for progressive CAD high on the list. Will need R/L cath tomorrow with limited contrasts given CKD. LBBB may also be playing a role and need to consider CRT-D. Continue IV lasix gently so as not to push kidneys too hard prior to cath.   Glori Bickers, MD  1:39 PM

## 2019-02-07 ENCOUNTER — Encounter (HOSPITAL_COMMUNITY)
Admission: EM | Disposition: A | Discharge status: Home / Self Care | Payer: Self-pay | Source: Home / Self Care | Attending: Family Medicine

## 2019-02-07 DIAGNOSIS — I2510 Atherosclerotic heart disease of native coronary artery without angina pectoris: Secondary | ICD-10-CM

## 2019-02-07 DIAGNOSIS — N183 Chronic kidney disease, stage 3 (moderate): Secondary | ICD-10-CM

## 2019-02-07 DIAGNOSIS — I509 Heart failure, unspecified: Secondary | ICD-10-CM

## 2019-02-07 HISTORY — PX: RIGHT/LEFT HEART CATH AND CORONARY ANGIOGRAPHY: CATH118266

## 2019-02-07 LAB — POCT I-STAT 7, (LYTES, BLD GAS, ICA,H+H)
Acid-base deficit: 3 mmol/L — ABNORMAL HIGH (ref 0.0–2.0)
Acid-base deficit: 4 mmol/L — ABNORMAL HIGH (ref 0.0–2.0)
Acid-base deficit: 6 mmol/L — ABNORMAL HIGH (ref 0.0–2.0)
Bicarbonate: 18.6 mmol/L — ABNORMAL LOW (ref 20.0–28.0)
Bicarbonate: 21.3 mmol/L (ref 20.0–28.0)
Bicarbonate: 21.7 mmol/L (ref 20.0–28.0)
Calcium, Ion: 1.17 mmol/L (ref 1.15–1.40)
Calcium, Ion: 1.22 mmol/L (ref 1.15–1.40)
Calcium, Ion: 1.23 mmol/L (ref 1.15–1.40)
HCT: 35 % — ABNORMAL LOW (ref 39.0–52.0)
HCT: 36 % — ABNORMAL LOW (ref 39.0–52.0)
HCT: 37 % — ABNORMAL LOW (ref 39.0–52.0)
Hemoglobin: 11.9 g/dL — ABNORMAL LOW (ref 13.0–17.0)
Hemoglobin: 12.2 g/dL — ABNORMAL LOW (ref 13.0–17.0)
Hemoglobin: 12.6 g/dL — ABNORMAL LOW (ref 13.0–17.0)
O2 Saturation: 55 %
O2 Saturation: 57 %
O2 Saturation: 93 %
Potassium: 4.3 mmol/L (ref 3.5–5.1)
Potassium: 4.4 mmol/L (ref 3.5–5.1)
Potassium: 4.4 mmol/L (ref 3.5–5.1)
Sodium: 143 mmol/L (ref 135–145)
Sodium: 143 mmol/L (ref 135–145)
Sodium: 143 mmol/L (ref 135–145)
TCO2: 20 mmol/L — ABNORMAL LOW (ref 22–32)
TCO2: 23 mmol/L (ref 22–32)
TCO2: 23 mmol/L (ref 22–32)
pCO2 arterial: 33.9 mmHg (ref 32.0–48.0)
pCO2 arterial: 38.4 mmHg (ref 32.0–48.0)
pCO2 arterial: 39.7 mmHg (ref 32.0–48.0)
pH, Arterial: 7.338 — ABNORMAL LOW (ref 7.350–7.450)
pH, Arterial: 7.348 — ABNORMAL LOW (ref 7.350–7.450)
pH, Arterial: 7.36 (ref 7.350–7.450)
pO2, Arterial: 31 mmHg — CL (ref 83.0–108.0)
pO2, Arterial: 31 mmHg — CL (ref 83.0–108.0)
pO2, Arterial: 68 mmHg — ABNORMAL LOW (ref 83.0–108.0)

## 2019-02-07 LAB — BASIC METABOLIC PANEL WITH GFR
Anion gap: 10 (ref 5–15)
BUN: 37 mg/dL — ABNORMAL HIGH (ref 8–23)
CO2: 20 mmol/L — ABNORMAL LOW (ref 22–32)
Calcium: 8.7 mg/dL — ABNORMAL LOW (ref 8.9–10.3)
Chloride: 112 mmol/L — ABNORMAL HIGH (ref 98–111)
Creatinine, Ser: 2.16 mg/dL — ABNORMAL HIGH (ref 0.61–1.24)
GFR calc Af Amer: 33 mL/min — ABNORMAL LOW (ref >60)
GFR calc non Af Amer: 29 mL/min — ABNORMAL LOW (ref >60)
Glucose, Bld: 116 mg/dL — ABNORMAL HIGH (ref 70–99)
Potassium: 4.9 mmol/L (ref 3.5–5.1)
Sodium: 142 mmol/L (ref 135–145)

## 2019-02-07 LAB — LEGIONELLA PNEUMOPHILA SEROGP 1 UR AG: L. pneumophila Serogp 1 Ur Ag: NEGATIVE

## 2019-02-07 LAB — GLUCOSE, CAPILLARY
Glucose-Capillary: 113 mg/dL — ABNORMAL HIGH (ref 70–99)
Glucose-Capillary: 116 mg/dL — ABNORMAL HIGH (ref 70–99)
Glucose-Capillary: 109 mg/dL — ABNORMAL HIGH (ref 70–99)
Glucose-Capillary: 271 mg/dL — ABNORMAL HIGH (ref 70–99)

## 2019-02-07 SURGERY — RIGHT/LEFT HEART CATH AND CORONARY ANGIOGRAPHY
Anesthesia: LOCAL

## 2019-02-07 MED ORDER — SODIUM CHLORIDE 0.9% FLUSH
3.0000 mL | Freq: Two times a day (BID) | INTRAVENOUS | Status: DC
  Administered 2019-02-07 – 2019-02-10 (×5): 3 mL via INTRAVENOUS

## 2019-02-07 MED ORDER — FUROSEMIDE 10 MG/ML IJ SOLN
80.0000 mg | Freq: Two times a day (BID) | INTRAMUSCULAR | Status: DC
  Administered 2019-02-07 – 2019-02-09 (×4): 80 mg via INTRAVENOUS
  Filled 2019-02-07 (×4): qty 8

## 2019-02-07 MED ORDER — SODIUM CHLORIDE 0.9 % IV SOLN: 250.0000 mL | INTRAVENOUS | Status: DC | PRN

## 2019-02-07 MED ORDER — ENOXAPARIN SODIUM 40 MG/0.4ML ~~LOC~~ SOLN: 40.0000 mg | SUBCUTANEOUS | Status: DC

## 2019-02-07 MED ORDER — SODIUM CHLORIDE 0.9 % IV SOLN: INTRAVENOUS | Status: AC

## 2019-02-07 MED ORDER — LIDOCAINE HCL (PF) 1 % IJ SOLN
INTRAMUSCULAR | Status: DC | PRN
  Administered 2019-02-07 (×2): 2 mL

## 2019-02-07 MED ORDER — CARVEDILOL 12.5 MG PO TABS
12.5000 mg | ORAL_TABLET | Freq: Two times a day (BID) | ORAL | Status: DC
  Administered 2019-02-07 – 2019-02-09 (×4): 12.5 mg via ORAL
  Filled 2019-02-07 (×4): qty 1

## 2019-02-07 MED ORDER — HEPARIN (PORCINE) IN NACL 1000-0.9 UT/500ML-% IV SOLN
INTRAVENOUS | Status: DC | PRN
  Administered 2019-02-07 (×2): 500 mL

## 2019-02-07 MED ORDER — SODIUM CHLORIDE 0.9% FLUSH: 3.0000 mL | INTRAVENOUS | Status: DC | PRN

## 2019-02-07 MED ORDER — HYDRALAZINE HCL 20 MG/ML IJ SOLN: 10.0000 mg | INTRAMUSCULAR | Status: AC | PRN

## 2019-02-07 MED ORDER — MIDAZOLAM HCL 2 MG/2ML IJ SOLN
INTRAMUSCULAR | Status: AC
  Filled 2019-02-07: qty 2

## 2019-02-07 MED ORDER — LABETALOL HCL 5 MG/ML IV SOLN: 10.0000 mg | INTRAVENOUS | Status: AC | PRN

## 2019-02-07 MED ORDER — SODIUM CHLORIDE 0.9 % IV SOLN: INTRAVENOUS | Status: DC

## 2019-02-07 MED ORDER — SODIUM CHLORIDE 0.9 % IV SOLN
INTRAVENOUS | Status: DC
  Administered 2019-02-07: 09:00:00 via INTRAVENOUS

## 2019-02-07 MED ORDER — ASPIRIN 81 MG PO CHEW
81.0000 mg | CHEWABLE_TABLET | ORAL | Status: AC
  Administered 2019-02-07: 81 mg via ORAL
  Filled 2019-02-07: qty 1

## 2019-02-07 MED ORDER — ACETAMINOPHEN 325 MG PO TABS: 650.0000 mg | ORAL_TABLET | ORAL | Status: DC | PRN

## 2019-02-07 MED ORDER — LIDOCAINE HCL (PF) 1 % IJ SOLN
INTRAMUSCULAR | Status: AC
  Filled 2019-02-07: qty 30

## 2019-02-07 MED ORDER — HEPARIN (PORCINE) IN NACL 1000-0.9 UT/500ML-% IV SOLN
INTRAVENOUS | Status: AC
  Filled 2019-02-07: qty 1000

## 2019-02-07 MED ORDER — MIDAZOLAM HCL 2 MG/2ML IJ SOLN
INTRAMUSCULAR | Status: DC | PRN
  Administered 2019-02-07: 1 mg via INTRAVENOUS

## 2019-02-07 MED ORDER — ASPIRIN 81 MG PO CHEW: 81.0000 mg | CHEWABLE_TABLET | ORAL | Status: DC

## 2019-02-07 MED ORDER — HEPARIN SODIUM (PORCINE) 1000 UNIT/ML IJ SOLN
INTRAMUSCULAR | Status: DC | PRN
  Administered 2019-02-07: 3500 [IU] via INTRAVENOUS

## 2019-02-07 MED ORDER — IOHEXOL 350 MG/ML SOLN
INTRAVENOUS | Status: DC | PRN
  Administered 2019-02-07: 60 mL via INTRACARDIAC

## 2019-02-07 MED ORDER — VERAPAMIL HCL 2.5 MG/ML IV SOLN
INTRAVENOUS | Status: DC | PRN
  Administered 2019-02-07: 10 mL via INTRA_ARTERIAL

## 2019-02-07 MED ORDER — ONDANSETRON HCL 4 MG/2ML IJ SOLN: 4.0000 mg | Freq: Four times a day (QID) | INTRAMUSCULAR | Status: DC | PRN

## 2019-02-07 SURGICAL SUPPLY — 14 items
CATH 5FR JL3.5 JR4 ANG PIG MP (CATHETERS) ×1 IMPLANT
CATH BALLN WEDGE 5F 110CM (CATHETERS) ×1 IMPLANT
DEVICE RAD COMP TR BAND LRG (VASCULAR PRODUCTS) ×1 IMPLANT
GLIDESHEATH SLEND SS 6F .021 (SHEATH) ×1 IMPLANT
GUIDEWIRE .025 260CM (WIRE) ×1 IMPLANT
GUIDEWIRE INQWIRE 1.5J.035X260 (WIRE) IMPLANT
INQWIRE 1.5J .035X260CM (WIRE) ×2
KIT HEART LEFT (KITS) ×2 IMPLANT
KIT MICROPUNCTURE NIT STIFF (SHEATH) ×1 IMPLANT
PACK CARDIAC CATHETERIZATION (CUSTOM PROCEDURE TRAY) ×2 IMPLANT
SHEATH GLIDE SLENDER 4/5FR (SHEATH) ×1 IMPLANT
SHEATH PROBE COVER 6X72 (BAG) ×1 IMPLANT
TRANSDUCER W/STOPCOCK (MISCELLANEOUS) ×2 IMPLANT
TUBING CIL FLEX 10 FLL-RA (TUBING) ×2 IMPLANT

## 2019-02-07 NOTE — Plan of Care (Signed)
  Problem: Education: Goal: Knowledge of General Education information will improve Description: Including pain rating scale, medication(s)/side effects and non-pharmacologic comfort measures Outcome: Progressing   Problem: Clinical Measurements: Goal: Will remain free from infection Outcome: Progressing Goal: Respiratory complications will improve Outcome: Progressing   Problem: Activity: Goal: Risk for activity intolerance will decrease Outcome: Progressing   Problem: Nutrition: Goal: Adequate nutrition will be maintained Outcome: Progressing   Problem: Coping: Goal: Level of anxiety will decrease Outcome: Progressing   Problem: Elimination: Goal: Will not experience complications related to bowel motility Outcome: Progressing Goal: Will not experience complications related to urinary retention Outcome: Progressing   Problem: Pain Managment: Goal: General experience of comfort will improve Outcome: Progressing   Problem: Safety: Goal: Ability to remain free from injury will improve Outcome: Progressing   Problem: Skin Integrity: Goal: Risk for impaired skin integrity will decrease Outcome: Progressing   

## 2019-02-07 NOTE — Progress Notes (Signed)
Advanced Heart Failure Rounding Note   Subjective:    Denies CP or SOB. Feels a bit weak. Slept poorly.    Objective:   Weight Range:  Vital Signs:   Temp:  [97.5 F (36.4 C)-97.8 F (36.6 C)] 97.5 F (36.4 C) (05/15 0615) Pulse Rate:  [69-85] 85 (05/15 0832) Resp:  [17] 17 (05/15 0615) BP: (110-151)/(83-87) 138/87 (05/15 0832) SpO2:  [91 %-100 %] 91 % (05/15 0615) Weight:  [73.3 kg] 73.3 kg (05/15 0618) Last BM Date: 02/05/19  Weight change: Filed Weights   02/04/19 2330 02/06/19 0603 02/07/19 0618  Weight: 75.8 kg 73.4 kg 73.3 kg    Intake/Output:   Intake/Output Summary (Last 24 hours) at 02/07/2019 4854 Last data filed at 02/07/2019 0618 Gross per 24 hour  Intake 480 ml  Output 700 ml  Net -220 ml     Physical Exam: General:  Elderly. No resp difficulty HEENT: normal x poor dentition  Neck: supple. JVP . Carotids 2+ bilat; no bruits. No lymphadenopathy or thryomegaly appreciated. Cor: PMI nondisplaced. Regular rate & rhythm. 2/6 MR Lungs: clear Abdomen: soft, nontender, nondistended. No hepatosplenomegaly. No bruits or masses. Good bowel sounds. Extremities: no cyanosis, clubbing, rash, 1-2+ edema  S/p R transmetatarsal amp Neuro: alert & orientedx3, cranial nerves grossly intact. moves all 4 extremities w/o difficulty. Affect pleasant  Telemetry: NSR   Labs: Basic Metabolic Panel: Recent Labs  Lab 02/04/19 2007 02/05/19 0230 02/06/19 0449 02/07/19 0401  NA 142 143 144 142  K 4.7 5.1 4.6 4.9  CL 114* 111 113* 112*  CO2 20* 23 21* 20*  GLUCOSE 149* 178* 155* 116*  BUN 28* 29* 32* 37*  CREATININE 1.87* 2.06*  1.98* 1.96* 2.16*  CALCIUM 8.8* 8.9 8.5* 8.7*  MG  --  1.6*  --   --     Liver Function Tests: Recent Labs  Lab 02/04/19 2007 02/05/19 0230  AST 49* 31  ALT 45* 41  ALKPHOS 60 60  BILITOT 0.6 0.4  PROT 5.6* 5.8*  ALBUMIN 3.3* 3.4*   No results for input(s): LIPASE, AMYLASE in the last 168 hours. No results for input(s):  AMMONIA in the last 168 hours.  CBC: Recent Labs  Lab 02/04/19 2007 02/05/19 0230  WBC 4.8 5.7  NEUTROABS 3.3 4.1  HGB 12.0* 12.3*  HCT 39.5 39.1  MCV 96.6 94.2  PLT 184 191    Cardiac Enzymes: Recent Labs  Lab 02/04/19 2007 02/05/19 0230 02/05/19 0736 02/05/19 1437  TROPONINI 0.03* 0.03* 0.03* 0.03*    BNP: BNP (last 3 results) Recent Labs    02/04/19 2008  BNP 2,222.4*    ProBNP (last 3 results) No results for input(s): PROBNP in the last 8760 hours.    Other results:  Imaging:  No results found.   Medications:     Scheduled Medications: . azithromycin  500 mg Oral Daily  . carvedilol  25 mg Oral BID WC  . clopidogrel  75 mg Oral Daily  . enoxaparin (LOVENOX) injection  40 mg Subcutaneous Daily  . furosemide  40 mg Intravenous Daily  . insulin aspart  0-9 Units Subcutaneous TID WC  . iron polysaccharides  150 mg Oral BID  . montelukast  10 mg Oral QHS  . simvastatin  40 mg Oral Daily  . sodium chloride flush  3 mL Intravenous Q12H     Infusions: . sodium chloride    . sodium chloride 75 mL/hr at 02/07/19 0905  . cefTRIAXone (ROCEPHIN)  IV  1 g (02/06/19 2146)     PRN Medications:  sodium chloride, acetaminophen **OR** acetaminophen, albuterol, sodium chloride flush   Assessment/Plan:   1. Acute on chronic combined CHF: Patient with a history of non-ischemic cardiomyopathy with EF 25-30% in 2016, improved to 35-40% on echo in 2017. Now presenting with SOB and LE edema. BNP 2222. CT Chest with pleural effusions. Echo this admission revealed EF 15-20%, G3DD, and diffuse hypokinesis. He has been diuresing with IV lasix 40mg  BID with net -725mL in the past 24 hours and -1.2L this admission. Weight 167lbs>161lbs. Trops 0.03 x3 is not consistent with ACS and EKG with chronic LBBB. Possible there is an ischemic component as patient noted to have mild to moderate CAD in 2011. - He has Class IV HF with volume overload and worsening LV function with  EF 15-20%. Given CRFs concern for progressive CAD high on the list.  - Plan R/L cath today with limited contrast given CKD. LBBB may also be playing a role and need to consider CRT-D.  - Continue carvedilol - Consider entresto after catheterization if Cr improves. If not, will use Bidil  2. Elevated troponin in patient with CAD: non-obstructive on cardiac cath in 2011. Trop trend is 0.03 x3 - not consistent with ACS. Possible he has had progression of disease which is contributing to #1. On plavix for history of stroke.  - Aspirin started - Continue statin and carvedilol - Cath today  3. RUL PNA: seen on CT Chest. Urine strep/legionella negative. COVID-19 negative. Sputum cx pending. On IV antibiotics  - Continue management per primary team  4. Acute on CKD3:  - labs 3 years ago with Cr 1.5. Suspect this is new baseline - Creatinine running 1.9-2.1 - Will hydrate gently pre-cath   5. History of CVA: on plavix and statin - Continue plavix and statin  6. DM type 2: on ISS - Continue management per primary team - May be candidate for SGLT2i if CrCl > 30    Length of Stay: 3   Glori Bickers MD 02/07/2019, 9:22 AM  Advanced Heart Failure Team Pager [phone] (M-F; 7a - 4p)  Please contact [facility] for night-coverage after hours (4p -7a ) and weekends on [email or web address]

## 2019-02-07 NOTE — Interval H&P Note (Signed)
History and Physical Interval Note:  02/07/2019 1:32 PM  [name]  has presented today for surgery, with the diagnosis of heart failure.  The various methods of treatment have been discussed with the patient and family. After consideration of risks, benefits and other options for treatment, the patient has consented to  Procedure(s): RIGHT/LEFT HEART CATH AND CORONARY ANGIOGRAPHY (N/A) and possible coronary angioplasty as a surgical intervention.  The patient's history has been reviewed, patient examined, no change in status, stable for surgery.  I have reviewed the patient's chart and labs.  Questions were answered to the patient's satisfaction.     Daniel Bensimhon

## 2019-02-07 NOTE — Care Management Important Message (Signed)
Important Message  Patient Details  Name: [name] MRN: [id number] Date of Birth: Feb 05, 1941   Medicare Important Message Given:  Yes    [name] [address] 02/07/2019, 2:31 PM

## 2019-02-07 NOTE — Progress Notes (Signed)
PROGRESS NOTE    [name]  XBM:[id number] DOB: Jun 06, 1941 DOA: 02/04/2019 PCP: Prince Solian, MD   Brief Narrative: [name] is a 78 y.o. male with history of chronic systolic heart failure last EF measured in 2017 was 35 to 40% with grade 1 diastolic dysfunction, history of stroke diabetes mellitus type 2, chronic kidney disease stage III. Patient presented secondary to shortness of breath.    Assessment & Plan:   Principal Problem:   Acute on chronic congestive heart failure (HCC) Active Problems:   CAD, NATIVE VESSEL   Essential hypertension   DM2 (diabetes mellitus, type 2) ([place])   Community acquired pneumonia of left lower lobe of lung (HCC)   CKD (chronic kidney disease) stage 3, GFR 30-59 ml/min (HCC)   Acute CHF (congestive heart failure) (HCC)   Acute on chronic combined systolic and heart failure Last EF of 35-40% with grade 1 diastolic dysfunction per 0272 Transthoracic Echocardiogram. On Coreg, losartan spironolactone (not yet started) as an outpatient. Transthoracic Echocardiogram from 5/14 significant for worsened EF of 15-20% with diffuseshypokinesis. Last stress test in 2017 high risk but without ischemia. Weight stable. 1.1 L UOP over last 24 hours. Heart failure team planning R/L heart cath today -Continue Lasix -Daily weights/strict in and out -Cardiology recommendations: heart cath, eventually Entresto, aspirin  Elevated troponin In setting of acute heart failure. Flat trend. No chest pain  Right upper lobe pneumonia Undetectable procalcitonin and no WBC, but with productive, grey cough/sputum and associated chest discomfort in that area. Blood cultures no growth to date. Sputum culture consistent with normal respiratory flora -Continue Ceftriaxone/azithromycin  Diabetes mellitus, type 2 -Continue sliding scale  Acute kidney injury on CKD stage III Baseline creatinine of about 1.5 from three years ago. Up to 2.06 peak. In setting of fluid  overload. Down to 1.96 today -Continue lasix -Daily BMP  History of stroke -Continue Plavix and simvastatin  Chronic anemia Unknown etiology. Baseline of 9.2 from three years ago. Stable at 12. On iron supplementation -Outpatient follow-up   DVT prophylaxis: Lovenox Code Status:   Code Status: Full Code Family Communication: None Disposition Plan: Discharge home pending cardiology recommendations   Consultants:   Cardiology/Heart failure  Procedures:   None  Antimicrobials:  Ceftriaxone  Azithromycin    Subjective: Cough/sputum production is improved. Had some shortness of breath overnight.  Objective: Vitals:   02/06/19 2225 02/07/19 0615 02/07/19 0618 02/07/19 0832  BP: 110/85 (!) 151/87  138/87  Pulse: 72 83  85  Resp:  17    Temp: 97.8 F (36.6 C) (!) 97.5 F (36.4 C)    TempSrc: Oral Oral    SpO2: 91% 91%    Weight:   73.3 kg   Height:        Intake/Output Summary (Last 24 hours) at 02/07/2019 1112 Last data filed at 02/07/2019 1100 Gross per 24 hour  Intake 480 ml  Output 800 ml  Net -320 ml   Filed Weights   02/04/19 2330 02/06/19 0603 02/07/19 0618  Weight: 75.8 kg 73.4 kg 73.3 kg    Examination:  General exam: Appears calm and comfortable Respiratory system: Diminished but no rales or wheezing heard. Respiratory effort normal. Cardiovascular system: S1 & S2 heard, RRR. No murmurs, rubs, gallops or clicks. Gastrointestinal system: Abdomen is nondistended, soft and nontender. No organomegaly or masses felt. Normal bowel sounds heard. Central nervous system: Alert and oriented. No focal neurological deficits. Extremities: No edema. No calf tenderness Skin: No cyanosis. No rashes Psychiatry: Judgement  and insight appear normal. Mood & affect appropriate.       Data Reviewed: I have personally reviewed following labs and imaging studies  CBC: Recent Labs  Lab 02/04/19 2007 02/05/19 0230  WBC 4.8 5.7  NEUTROABS 3.3 4.1  HGB 12.0*  12.3*  HCT 39.5 39.1  MCV 96.6 94.2  PLT 184 694   Basic Metabolic Panel: Recent Labs  Lab 02/04/19 2007 02/05/19 0230 02/06/19 0449 02/07/19 0401  NA 142 143 144 142  K 4.7 5.1 4.6 4.9  CL 114* 111 113* 112*  CO2 20* 23 21* 20*  GLUCOSE 149* 178* 155* 116*  BUN 28* 29* 32* 37*  CREATININE 1.87* 2.06*   1.98* 1.96* 2.16*  CALCIUM 8.8* 8.9 8.5* 8.7*  MG  --  1.6*  --   --    GFR: Estimated Creatinine Clearance: 30.5 mL/min (A) (by C-G formula based on SCr of 2.16 mg/dL (H)). Liver Function Tests: Recent Labs  Lab 02/04/19 2007 02/05/19 0230  AST 49* 31  ALT 45* 41  ALKPHOS 60 60  BILITOT 0.6 0.4  PROT 5.6* 5.8*  ALBUMIN 3.3* 3.4*   No results for input(s): LIPASE, AMYLASE in the last 168 hours. No results for input(s): AMMONIA in the last 168 hours. Coagulation Profile: No results for input(s): INR, PROTIME in the last 168 hours. Cardiac Enzymes: Recent Labs  Lab 02/04/19 2007 02/05/19 0230 02/05/19 0736 02/05/19 1437  TROPONINI 0.03* 0.03* 0.03* 0.03*   BNP (last 3 results) No results for input(s): PROBNP in the last 8760 hours. HbA1C: No results for input(s): HGBA1C in the last 72 hours. CBG: Recent Labs  Lab 02/06/19 0748 02/06/19 1130 02/06/19 1648 02/06/19 2223 02/07/19 0810  GLUCAP 127* 199* 134* 135* 113*   Lipid Profile: No results for input(s): CHOL, HDL, LDLCALC, TRIG, CHOLHDL, LDLDIRECT in the last 72 hours. Thyroid Function Tests: Recent Labs    02/05/19 0230  TSH 1.690   Anemia Panel: No results for input(s): VITAMINB12, FOLATE, FERRITIN, TIBC, IRON, RETICCTPCT in the last 72 hours. Sepsis Labs: Recent Labs  Lab 02/04/19 2008 02/04/19 2200 02/05/19 0230  PROCALCITON  --   --  <0.10  LATICACIDVEN 1.2 1.0  --     Recent Results (from the past 240 hour(s))  Urine culture     Status: None   Collection Time: 02/04/19  7:07 PM  Result Value Ref Range Status   Specimen Description URINE, RANDOM  Final   Special Requests NONE   Final   Culture   Final    NO GROWTH Performed at [facility], [address] [place],  [address]    Report Status 02/05/2019 FINAL  Final  SARS Coronavirus 2 (CEPHEID- Performed in [place] hospital lab), Hosp Order     Status: None   Collection Time: 02/04/19  7:30 PM  Result Value Ref Range Status   SARS Coronavirus 2 NEGATIVE NEGATIVE Final    Comment: (NOTE) If result is NEGATIVE SARS-CoV-2 target nucleic acids are NOT DETECTED. The SARS-CoV-2 RNA is generally detectable in upper and lower  respiratory specimens during the acute phase of infection. The lowest  concentration of SARS-CoV-2 viral copies this assay can detect is 250  copies / mL. A negative result does not preclude SARS-CoV-2 infection  and should not be used as the sole basis for treatment or other  patient management decisions.  A negative result may occur with  improper specimen collection / handling, submission of specimen other  than nasopharyngeal  swab, presence of viral mutation(s) within the  areas targeted by this assay, and inadequate number of viral copies  (<250 copies / mL). A negative result must be combined with clinical  observations, patient history, and epidemiological information. If result is POSITIVE SARS-CoV-2 target nucleic acids are DETECTED. The SARS-CoV-2 RNA is generally detectable in upper and lower  respiratory specimens dur ing the acute phase of infection.  Positive  results are indicative of active infection with SARS-CoV-2.  Clinical  correlation with patient history and other diagnostic information is  necessary to determine patient infection status.  Positive results do  not rule out bacterial infection or co-infection with other viruses. If result is PRESUMPTIVE POSTIVE SARS-CoV-2 nucleic acids MAY BE PRESENT.   A presumptive positive result was obtained on the submitted specimen  and confirmed on repeat testing.  While 2019 novel coronavirus  (SARS-CoV-2)  nucleic acids may be present in the submitted sample  additional confirmatory testing may be necessary for epidemiological  and / or clinical management purposes  to differentiate between  SARS-CoV-2 and other Sarbecovirus currently known to infect humans.  If clinically indicated additional testing with an alternate test  methodology [phone]) is advised. The SARS-CoV-2 RNA is generally  detectable in upper and lower respiratory sp ecimens during the acute  phase of infection. The expected result is Negative. Fact Sheet for Patients:  StrictlyIdeas.no Fact Sheet for Healthcare Providers: BankingDealers.co.za This test is not yet approved or cleared by the [place] FDA and has been authorized for detection and/or diagnosis of SARS-CoV-2 by FDA under an Emergency Use Authorization (EUA).  This EUA will remain in effect (meaning this test can be used) for the duration of the COVID-19 declaration under Section 564(b)(1) of the Act, 21 U.S.C. section 360bbb-3(b)(1), unless the authorization is terminated or revoked sooner. Performed at [facility], [place] [address] [place], [place] [address]   Blood culture (routine x 2)     Status: None (Preliminary result)   Collection Time: 02/04/19  9:30 PM  Result Value Ref Range Status   Specimen Description BLOOD RIGHT ARM  Final   Special Requests   Final    BOTTLES DRAWN AEROBIC AND ANAEROBIC Blood Culture results may not be optimal due to an excessive volume of blood received in culture bottles   Culture   Final    NO GROWTH 3 DAYS Performed at [facility], [place] [address] [place], [place] [address]    Report Status PENDING  Incomplete  Blood culture (routine x 2)     Status: None (Preliminary result)   Collection Time: 02/04/19 10:02 PM  Result Value Ref Range Status   Specimen Description BLOOD RIGHT HAND  Final   Special Requests   Final    BOTTLES DRAWN AEROBIC ONLY Blood  Culture adequate volume   Culture   Final    NO GROWTH 3 DAYS Performed at [facility], [place] [address] [place], [place] [address]    Report Status PENDING  Incomplete  Expectorated sputum assessment w rflx to resp cult     Status: None   Collection Time: 02/05/19  2:46 PM  Result Value Ref Range Status   Specimen Description EXPECTORATED SPUTUM  Final   Special Requests NONE  Final   Sputum evaluation   Final    THIS SPECIMEN IS ACCEPTABLE FOR SPUTUM CULTURE Performed at [facility], [place] [address] [place], [place] [address]    Report Status 02/05/2019 FINAL  Final  Culture, respiratory     Status: None (Preliminary result)   Collection Time: 02/05/19  2:46 PM  Result Value Ref Range Status   Specimen Description EXPECTORATED SPUTUM  Final   Special Requests NONE Reflexed from [id number]  Final   Gram Stain   Final    ABUNDANT WBC PRESENT, PREDOMINANTLY PMN RARE GRAM POSITIVE COCCI    Culture   Final    RARE Consistent with normal respiratory flora. Performed at [facility], [place] [address] [place], [place] [address]    Report Status PENDING  Incomplete         Radiology Studies: No results found.      Scheduled Meds:  azithromycin  500 mg Oral Daily   carvedilol  25 mg Oral BID WC   clopidogrel  75 mg Oral Daily   enoxaparin (LOVENOX) injection  40 mg Subcutaneous Daily   furosemide  40 mg Intravenous Daily   insulin aspart  0-9 Units Subcutaneous TID WC   iron polysaccharides  150 mg Oral BID   montelukast  10 mg Oral QHS   simvastatin  40 mg Oral Daily   sodium chloride flush  3 mL Intravenous Q12H   Continuous Infusions:  sodium chloride     sodium chloride 75 mL/hr at 02/07/19 0905   cefTRIAXone (ROCEPHIN)  IV 1 g (02/06/19 2146)     LOS: 3 days     Cordelia Poche, MD [facility] 02/07/2019, 11:12 AM  If 7PM-7AM, please contact night-coverage [email or web address]

## 2019-02-08 ENCOUNTER — Inpatient Hospital Stay (HOSPITAL_COMMUNITY): Payer: Medicare HMO

## 2019-02-08 DIAGNOSIS — I10 Essential (primary) hypertension: Secondary | ICD-10-CM

## 2019-02-08 DIAGNOSIS — I255 Ischemic cardiomyopathy: Secondary | ICD-10-CM

## 2019-02-08 LAB — BASIC METABOLIC PANEL
Anion gap: 10 (ref 5–15)
BUN: 38 mg/dL — ABNORMAL HIGH (ref 8–23)
CO2: 22 mmol/L (ref 22–32)
Calcium: 8.8 mg/dL — ABNORMAL LOW (ref 8.9–10.3)
Chloride: 109 mmol/L (ref 98–111)
Creatinine, Ser: 2.05 mg/dL — ABNORMAL HIGH (ref 0.61–1.24)
GFR calc Af Amer: 36 mL/min — ABNORMAL LOW (ref >60)
GFR calc non Af Amer: 31 mL/min — ABNORMAL LOW (ref >60)
Glucose, Bld: 205 mg/dL — ABNORMAL HIGH (ref 70–99)
Potassium: 4.4 mmol/L (ref 3.5–5.1)
Sodium: 141 mmol/L (ref 135–145)

## 2019-02-08 LAB — CULTURE, RESPIRATORY W GRAM STAIN: Culture: NORMAL

## 2019-02-08 LAB — GLUCOSE, CAPILLARY
Glucose-Capillary: 154 mg/dL — ABNORMAL HIGH (ref 70–99)
Glucose-Capillary: 196 mg/dL — ABNORMAL HIGH (ref 70–99)
Glucose-Capillary: 88 mg/dL (ref 70–99)
Glucose-Capillary: 158 mg/dL — ABNORMAL HIGH (ref 70–99)

## 2019-02-08 MED ORDER — TRAZODONE HCL 50 MG PO TABS
50.0000 mg | ORAL_TABLET | Freq: Every evening | ORAL | Status: DC | PRN
  Administered 2019-02-08: 50 mg via ORAL
  Filled 2019-02-08: qty 1

## 2019-02-08 NOTE — Progress Notes (Signed)
Advanced Heart Failure Rounding Note   Subjective:    Cath yesterday with likely high-grade calcified distal LM lesion. PCW 36 with CI 2.2  On IV lasix. With good urine output. Weight down 4 pounds. Breathing better. No orthopnea or PND.   Objective:   Weight Range:  Vital Signs:   Temp:  [97.5 F (36.4 C)-97.9 F (36.6 C)] 97.9 F (36.6 C) (05/16 0548) Pulse Rate:  [67-81] 76 (05/16 0548) Resp:  [10-55] 10 (05/15 1520) BP: (111-148)/(60-94) 127/76 (05/16 0548) SpO2:  [90 %-100 %] 98 % (05/16 0548) Weight:  [71.3 kg] 71.3 kg (05/16 0550) Last BM Date: 02/05/19  Weight change: Filed Weights   02/06/19 0603 02/07/19 0618 02/08/19 0550  Weight: 73.4 kg 73.3 kg 71.3 kg    Intake/Output:   Intake/Output Summary (Last 24 hours) at 02/08/2019 1049 Last data filed at 02/08/2019 0900 Gross per 24 hour  Intake 766.88 ml  Output 2625 ml  Net -1858.12 ml     Physical Exam: General:  Sitting in bed. Weak appearing  No resp difficulty HEENT: normal Neck: supple. JVP 8-9 Carotids 2+ bilat; no bruits. No lymphadenopathy or thryomegaly appreciated. Cor: PMI nondisplaced. Regular rate & rhythm. No rubs, gallops or murmurs. Lungs: clear Abdomen: soft, nontender, nondistended. No hepatosplenomegaly. No bruits or masses. Good bowel sounds. Extremities: no cyanosis, clubbing, rash, trace- 1+ edema. S/p R transmet amp  Neuro: alert & orientedx3, cranial nerves grossly intact. moves all 4 extremities w/o difficulty. Affect pleasant   Telemetry: NSR 70-80s. Personally reviewed   Labs: Basic Metabolic Panel: Recent Labs  Lab 02/04/19 2007 02/05/19 0230 02/06/19 0449 02/07/19 0401 02/07/19 1457 02/07/19 1504 02/08/19 0320  NA 142 143 144 142 143 143  143 141  K 4.7 5.1 4.6 4.9 4.3 4.4  4.4 4.4  CL 114* 111 113* 112*  --   --  109  CO2 20* 23 21* 20*  --   --  22  GLUCOSE 149* 178* 155* 116*  --   --  205*  BUN 28* 29* 32* 37*  --   --  38*  CREATININE 1.87* 2.06*   1.98* 1.96* 2.16*  --   --  2.05*  CALCIUM 8.8* 8.9 8.5* 8.7*  --   --  8.8*  MG  --  1.6*  --   --   --   --   --     Liver Function Tests: Recent Labs  Lab 02/04/19 2007 02/05/19 0230  AST 49* 31  ALT 45* 41  ALKPHOS 60 60  BILITOT 0.6 0.4  PROT 5.6* 5.8*  ALBUMIN 3.3* 3.4*   No results for input(s): LIPASE, AMYLASE in the last 168 hours. No results for input(s): AMMONIA in the last 168 hours.  CBC: Recent Labs  Lab 02/04/19 2007 02/05/19 0230 02/07/19 1457 02/07/19 1504  WBC 4.8 5.7  --   --   NEUTROABS 3.3 4.1  --   --   HGB 12.0* 12.3* 11.9* 12.6*  12.2*  HCT 39.5 39.1 35.0* 37.0*  36.0*  MCV 96.6 94.2  --   --   PLT 184 191  --   --     Cardiac Enzymes: Recent Labs  Lab 02/04/19 2007 02/05/19 0230 02/05/19 0736 02/05/19 1437  TROPONINI 0.03* 0.03* 0.03* 0.03*    BNP: BNP (last 3 results) Recent Labs    02/04/19 2008  BNP 2,222.4*    ProBNP (last 3 results) No results for input(s): PROBNP in the last 8760  hours.    Other results:  Imaging: No results found.   Medications:     Scheduled Medications: . azithromycin  500 mg Oral Daily  . carvedilol  12.5 mg Oral BID WC  . enoxaparin (LOVENOX) injection  40 mg Subcutaneous Daily  . furosemide  80 mg Intravenous BID  . insulin aspart  0-9 Units Subcutaneous TID WC  . iron polysaccharides  150 mg Oral BID  . montelukast  10 mg Oral QHS  . simvastatin  40 mg Oral Daily  . sodium chloride flush  3 mL Intravenous Q12H  . sodium chloride flush  3 mL Intravenous Q12H    Infusions: . sodium chloride    . cefTRIAXone (ROCEPHIN)  IV 1 g (02/07/19 2144)    PRN Medications: sodium chloride, acetaminophen **OR** acetaminophen, albuterol, ondansetron (ZOFRAN) IV, sodium chloride flush, traZODone   Assessment/Plan:   1. Acute on chronic combined CHF: Patient with a history of non-ischemic cardiomyopathy with EF 25-30% in 2016, improved to 35-40% on echo in 2017. Now presenting with SOB  and LE edema. BNP 2222. CT Chest with pleural effusions. Echo this admission revealed EF 15-20%, G3DD, and diffuse hypokinesis. He has been diuresing with IV lasix 40mg  BID with net -765mL in the past 24 hours and -1.2L this admission. Weight 167lbs>161lbs. Trops 0.03 x3 is not consistent with ACS and EKG with chronic LBBB. Possible there is an ischemic component as patient noted to have mild to moderate CAD in 2011. - He has Class IV HF with volume overload and worsening LV function with EF 15-20%.  - Cath 5/15 with likely high-grade distal LM lesion. Will plan flow-wire interrogation early next week with Dr. Burt Knack to confirm.  - I have reviewed case with Dr. Prescott Gum who will see and follow along - however CABG would be very high risk with age, fraility and CKD. - Volume status remains elevated. Continue IV lasix today - LBBB may also be playing a role and need to consider CRT-D.  - Continue carvedilol - Consider entresto if Cr improves. If not, will use Bidil  2. CAD: - Cath 5/15 with likely high-grade distal LM lesion. Will plan flow-wire interrogation early next week with Dr. Burt Knack to confirm.  - On ASA. plavix stopped 5/15 with possible need for CABG - Continue statin  - Cath today  3. RUL PNA: seen on CT Chest. Urine strep/legionella negative. COVID-19 negative. Sputum cx pending. On IV antibiotics  - Continue management per primary team  4. Acute on CKD3:  - labs 3 years ago with Cr 1.5. Suspect this is new baseline - Creatinine running 1.9-2.1 - Stable at 2.0 post-cath. Watch closely with diuresis  5. History of CVA: on plavix and statin - Continue plavix and statin. Plavix stopped 5/15 for possible CABG eval   6. DM type 2: on ISS - Continue management per primary team - May be candidate for SGLT2i if CrCl > 30    Length of Stay: 4   Glori Bickers MD 02/08/2019, 10:49 AM  Advanced Heart Failure Team Pager [phone] (M-F; [place])  Please contact [facility] for night-coverage after hours (4p -7a ) and weekends on [email or web address]

## 2019-02-08 NOTE — Progress Notes (Signed)
PROGRESS NOTE    [name]  TWS:[id number] DOB: March 11, 1941 DOA: 02/04/2019 PCP: Prince Solian, MD   Brief Narrative: [name] is a 78 y.o. male with history of chronic systolic heart failure last EF measured in 2017 was 35 to 40% with grade 1 diastolic dysfunction, history of stroke diabetes mellitus type 2, chronic kidney disease stage III. Patient presented secondary to shortness of breath.    Assessment & Plan:   Principal Problem:   Acute on chronic congestive heart failure (HCC) Active Problems:   CAD, NATIVE VESSEL   Essential hypertension   DM2 (diabetes mellitus, type 2) ([place])   Community acquired pneumonia of left lower lobe of lung (HCC)   CKD (chronic kidney disease) stage 3, GFR 30-59 ml/min (HCC)   Acute CHF (congestive heart failure) (HCC)   Acute on chronic combined systolic and heart failure Ischemic cardiomyopathy Last EF of 35-40% with grade 1 diastolic dysfunction per 0017 Transthoracic Echocardiogram. On Coreg, losartan spironolactone (not yet started) as an outpatient. Transthoracic Echocardiogram from 5/14 significant for worsened EF of 15-20% with diffuseshypokinesis. Last stress test in 2017 high risk but without ischemia. Weight down to 157 lbs. 2.4 L UOP over last 24 hours. S/p R/L heart cath with 2 vessel disease; consideration for CABG. -Continue Lasix (increased to 80 mg IV BID per cardiology) -Daily weights/strict in and out -Cardiology recommendations: possible TCTS consult and CABG, eventually Entresto, aspirin  Elevated troponin In setting of acute heart failure. Flat trend. No chest pain  Right upper lobe pneumonia Undetectable procalcitonin and no WBC, but with productive, grey cough/sputum and associated chest discomfort in that area. Blood cultures no growth to date. Sputum culture consistent with normal respiratory flora. Rhonchi today -Continue Ceftriaxone/azithromycin -Chest x-ray  Diabetes mellitus, type 2 -Continue sliding  scale  Acute kidney injury on CKD stage III Baseline creatinine of about 1.5 from three years ago. Up to 2.06 peak. In setting of fluid overload. 2.05 today. Appears this may be around his baseline. He is s/p heart cath on 5/15 -Continue lasix -Daily BMP  History of stroke -Continue Plavix and simvastatin  Chronic anemia Unknown etiology. Baseline of 9.2 from three years ago. Stable at 12. On iron supplementation -Outpatient follow-up  Insomnia Appears to be secondary to external factors, including noise. No breathing issues or anxiety precipitating insomnia. Patient declines ear plugs -Trazodone prn   DVT prophylaxis: Lovenox Code Status:   Code Status: Full Code Family Communication: Called wife via telephone, however, no answer Disposition Plan: Discharge home pending cardiology recommendations   Consultants:   Cardiology/Heart failure  Procedures:   02/05/2019: Transthoracic Echocardiogram IMPRESSIONS    1. The cavity size was normal. There is mildly increased left ventricular wall thickness. Left ventricular diastolic Doppler parameters are consistent with restrictive filling. Elevated left ventricular end-diastolic pressure Left ventricular diffuse  hypokinesis, EF 15-20%.  2. The right ventricle has moderately reduced systolic function. The cavity was normal. There is no increase in right ventricular wall thickness.  3. Left atrial size was moderately dilated.  4. Left pleural effusion is present.  5. Mild thickening of the mitral valve leaflet. Mitral valve regurgitation is moderate by color flow Doppler, suspect functional MR. No evidence of mitral valve stenosis.  6. The aortic valve is tricuspid. Moderate calcification of the aortic valve. Aortic valve regurgitation is trivial by color flow Doppler. No stenosis of the aortic valve.  7. Trivial pericardial effusion is present.  8. The inferior vena cava was dilated in size with <50%  respiratory variability. PA  systolic pressure 45 mmHg.   02/07/2019: R and L Heart Catheterization Conclusion     Mid RCA to Dist RCA lesion is 50% stenosed.  Prox RCA lesion is 40% stenosed.  Mid LM to Dist LM lesion is 80% stenosed.  RPDA lesion is 90% stenosed.  Ost Cx to Prox Cx lesion is 80% stenosed.  Dist LM to Ost LAD lesion is 60% stenosed.  Prox Cx to Mid Cx lesion is 50% stenosed.  Prox LAD to Mid LAD lesion is 70% stenosed.   Findings:  Ao = 136/76 (97) LV = 139/28 RA = 9 RV = 65/10 PA = 67/29 (48) PCW = 36 Fick cardiac output/index = 4.1/2.2 PVR = 3.0 WU SVR = 1721 Ao sat =  PA sat =  Assessment: 1. Severe 2v CAD with probable high grade, calcified distal left main lesion 2. EF 20-25% due to iCM 3. Elevated filling pressures with moderately reduced CO  Plan/Discussion:  Continue diuresis. Flow wire LM lesion next weak to confirm. Probable TCTS consult for CABG.      Antimicrobials:  Ceftriaxone  Azithromycin    Subjective: Did not sleep well overnight. Breathing is better.  Objective: Vitals:   02/07/19 1710 02/07/19 2157 02/08/19 0548 02/08/19 0550  BP: 111/67 113/60 127/76   Pulse: 74 71 76   Resp:      Temp:  (!) 97.5 F (36.4 C) 97.9 F (36.6 C)   TempSrc:  Oral Oral   SpO2:  100% 98%   Weight:    71.3 kg  Height:        Intake/Output Summary (Last 24 hours) at 02/08/2019 0831 Last data filed at 02/08/2019 6063 Gross per 24 hour  Intake 766.88 ml  Output 2425 ml  Net -1658.12 ml   Filed Weights   02/06/19 0603 02/07/19 0618 02/08/19 0550  Weight: 73.4 kg 73.3 kg 71.3 kg    Examination:  General exam: Appears calm and comfortable Respiratory system: Diffuse rhonchi, no rales. Respiratory effort normal. Cardiovascular system: S1 & S2 heard, RRR. No murmurs, rubs, gallops or clicks. Gastrointestinal system: Abdomen is nondistended, soft and nontender. No organomegaly or masses felt. Normal bowel sounds heard. Central nervous system:  Alert and oriented. No focal neurological deficits. Extremities: 2+ LE edema R>L. No calf tenderness Skin: No cyanosis. No rashes Psychiatry: Judgement and insight appear normal. Mood & affect appropriate.      Data Reviewed: I have personally reviewed following labs and imaging studies  CBC: Recent Labs  Lab 02/04/19 2007 02/05/19 0230 02/07/19 1457 02/07/19 1504  WBC 4.8 5.7  --   --   NEUTROABS 3.3 4.1  --   --   HGB 12.0* 12.3* 11.9* 12.6*  12.2*  HCT 39.5 39.1 35.0* 37.0*  36.0*  MCV 96.6 94.2  --   --   PLT 184 191  --   --    Basic Metabolic Panel: Recent Labs  Lab 02/04/19 2007 02/05/19 0230 02/06/19 0449 02/07/19 0401 02/07/19 1457 02/07/19 1504 02/08/19 0320  NA 142 143 144 142 143 143  143 141  K 4.7 5.1 4.6 4.9 4.3 4.4  4.4 4.4  CL 114* 111 113* 112*  --   --  109  CO2 20* 23 21* 20*  --   --  22  GLUCOSE 149* 178* 155* 116*  --   --  205*  BUN 28* 29* 32* 37*  --   --  38*  CREATININE 1.87* 2.06*  1.98*  1.96* 2.16*  --   --  2.05*  CALCIUM 8.8* 8.9 8.5* 8.7*  --   --  8.8*  MG  --  1.6*  --   --   --   --   --    GFR: Estimated Creatinine Clearance: 31.4 mL/min (A) (by C-G formula based on SCr of 2.05 mg/dL (H)). Liver Function Tests: Recent Labs  Lab 02/04/19 2007 02/05/19 0230  AST 49* 31  ALT 45* 41  ALKPHOS 60 60  BILITOT 0.6 0.4  PROT 5.6* 5.8*  ALBUMIN 3.3* 3.4*   No results for input(s): LIPASE, AMYLASE in the last 168 hours. No results for input(s): AMMONIA in the last 168 hours. Coagulation Profile: No results for input(s): INR, PROTIME in the last 168 hours. Cardiac Enzymes: Recent Labs  Lab 02/04/19 2007 02/05/19 0230 02/05/19 0736 02/05/19 1437  TROPONINI 0.03* 0.03* 0.03* 0.03*   BNP (last 3 results) No results for input(s): PROBNP in the last 8760 hours. HbA1C: No results for input(s): HGBA1C in the last 72 hours. CBG: Recent Labs  Lab 02/07/19 0810 02/07/19 1133 02/07/19 1547 02/07/19 2155 02/08/19 0735   GLUCAP 113* 116* 109* 271* 154*   Lipid Profile: No results for input(s): CHOL, HDL, LDLCALC, TRIG, CHOLHDL, LDLDIRECT in the last 72 hours. Thyroid Function Tests: No results for input(s): TSH, T4TOTAL, FREET4, T3FREE, THYROIDAB in the last 72 hours. Anemia Panel: No results for input(s): VITAMINB12, FOLATE, FERRITIN, TIBC, IRON, RETICCTPCT in the last 72 hours. Sepsis Labs: Recent Labs  Lab 02/04/19 2008 02/04/19 2200 02/05/19 0230  PROCALCITON  --   --  <0.10  LATICACIDVEN 1.2 1.0  --     Recent Results (from the past 240 hour(s))  Urine culture     Status: None   Collection Time: 02/04/19  7:07 PM  Result Value Ref Range Status   Specimen Description URINE, RANDOM  Final   Special Requests NONE  Final   Culture   Final    NO GROWTH Performed at [facility], [address] [place], [place] [address]    Report Status 02/05/2019 FINAL  Final  SARS Coronavirus 2 (CEPHEID- Performed in [name] hospital lab), Hosp Order     Status: None   Collection Time: 02/04/19  7:30 PM  Result Value Ref Range Status   SARS Coronavirus 2 NEGATIVE NEGATIVE Final    Comment: (NOTE) If result is NEGATIVE SARS-CoV-2 target nucleic acids are NOT DETECTED. The SARS-CoV-2 RNA is generally detectable in upper and lower  respiratory specimens during the acute phase of infection. The lowest  concentration of SARS-CoV-2 viral copies this assay can detect is 250  copies / mL. A negative result does not preclude SARS-CoV-2 infection  and should not be used as the sole basis for treatment or other  patient management decisions.  A negative result may occur with  improper specimen collection / handling, submission of specimen other  than nasopharyngeal swab, presence of viral mutation(s) within the  areas targeted by this assay, and inadequate number of viral copies  (<250 copies / mL). A negative result must be combined with clinical  observations, patient history, and epidemiological  information. If result is POSITIVE SARS-CoV-2 target nucleic acids are DETECTED. The SARS-CoV-2 RNA is generally detectable in upper and lower  respiratory specimens dur ing the acute phase of infection.  Positive  results are indicative of active infection with SARS-CoV-2.  Clinical  correlation with patient history and other diagnostic information is  necessary  to determine patient infection status.  Positive results do  not rule out bacterial infection or co-infection with other viruses. If result is PRESUMPTIVE POSTIVE SARS-CoV-2 nucleic acids MAY BE PRESENT.   A presumptive positive result was obtained on the submitted specimen  and confirmed on repeat testing.  While 2019 novel coronavirus  (SARS-CoV-2) nucleic acids may be present in the submitted sample  additional confirmatory testing may be necessary for epidemiological  and / or clinical management purposes  to differentiate between  SARS-CoV-2 and other Sarbecovirus currently known to infect humans.  If clinically indicated additional testing with an alternate test  methodology [phone]) is advised. The SARS-CoV-2 RNA is generally  detectable in upper and lower respiratory sp ecimens during the acute  phase of infection. The expected result is Negative. Fact Sheet for Patients:  StrictlyIdeas.no Fact Sheet for Healthcare Providers: BankingDealers.co.za This test is not yet approved or cleared by the [place] FDA and has been authorized for detection and/or diagnosis of SARS-CoV-2 by FDA under an Emergency Use Authorization (EUA).  This EUA will remain in effect (meaning this test can be used) for the duration of the COVID-19 declaration under Section 564(b)(1) of the Act, 21 U.S.C. section 360bbb-3(b)(1), unless the authorization is terminated or revoked sooner. Performed at [facility], [place] [address], [place], [place] [address]   Blood culture (routine x 2)      Status: None (Preliminary result)   Collection Time: 02/04/19  9:30 PM  Result Value Ref Range Status   Specimen Description BLOOD RIGHT ARM  Final   Special Requests   Final    BOTTLES DRAWN AEROBIC AND ANAEROBIC Blood Culture results may not be optimal due to an excessive volume of blood received in culture bottles   Culture   Final    NO GROWTH 3 DAYS Performed at [facility], [place] [address] [place], [place] [address]    Report Status PENDING  Incomplete  Blood culture (routine x 2)     Status: None (Preliminary result)   Collection Time: 02/04/19 10:02 PM  Result Value Ref Range Status   Specimen Description BLOOD RIGHT HAND  Final   Special Requests   Final    BOTTLES DRAWN AEROBIC ONLY Blood Culture adequate volume   Culture   Final    NO GROWTH 3 DAYS Performed at [facility], [place] [address] [place], [place] [address]    Report Status PENDING  Incomplete  Expectorated sputum assessment w rflx to resp cult     Status: None   Collection Time: 02/05/19  2:46 PM  Result Value Ref Range Status   Specimen Description EXPECTORATED SPUTUM  Final   Special Requests NONE  Final   Sputum evaluation   Final    THIS SPECIMEN IS ACCEPTABLE FOR SPUTUM CULTURE Performed at [facility], [place] [address] [place], [place] [address]    Report Status 02/05/2019 FINAL  Final  Culture, respiratory     Status: None (Preliminary result)   Collection Time: 02/05/19  2:46 PM  Result Value Ref Range Status   Specimen Description EXPECTORATED SPUTUM  Final   Special Requests NONE Reflexed from [id number]  Final   Gram Stain   Final    ABUNDANT WBC PRESENT, PREDOMINANTLY PMN RARE GRAM POSITIVE COCCI    Culture   Final    RARE Consistent with normal respiratory flora. Performed at [facility], [place] [address], [place], [place] [address]  Report Status PENDING  Incomplete         Radiology Studies: No results found.      Scheduled Meds: . azithromycin   500 mg Oral Daily  . carvedilol  12.5 mg Oral BID WC  . enoxaparin (LOVENOX) injection  40 mg Subcutaneous Daily  . furosemide  80 mg Intravenous BID  . insulin aspart  0-9 Units Subcutaneous TID WC  . iron polysaccharides  150 mg Oral BID  . montelukast  10 mg Oral QHS  . simvastatin  40 mg Oral Daily  . sodium chloride flush  3 mL Intravenous Q12H  . sodium chloride flush  3 mL Intravenous Q12H   Continuous Infusions: . sodium chloride Stopped (02/07/19 1653)  . sodium chloride    . cefTRIAXone (ROCEPHIN)  IV 1 g (02/07/19 2144)     LOS: 4 days     Cordelia Poche, MD [facility] 02/08/2019, 8:31 AM  If 7PM-7AM, please contact night-coverage [email or web address]

## 2019-02-09 DIAGNOSIS — I5021 Acute systolic (congestive) heart failure: Secondary | ICD-10-CM

## 2019-02-09 LAB — CULTURE, BLOOD (ROUTINE X 2)
Culture: NO GROWTH
Culture: NO GROWTH
Special Requests: ADEQUATE

## 2019-02-09 LAB — GLUCOSE, CAPILLARY
Glucose-Capillary: 107 mg/dL — ABNORMAL HIGH (ref 70–99)
Glucose-Capillary: 204 mg/dL — ABNORMAL HIGH (ref 70–99)
Glucose-Capillary: 133 mg/dL — ABNORMAL HIGH (ref 70–99)
Glucose-Capillary: 223 mg/dL — ABNORMAL HIGH (ref 70–99)

## 2019-02-09 LAB — BASIC METABOLIC PANEL
Anion gap: 15 (ref 5–15)
BUN: 35 mg/dL — ABNORMAL HIGH (ref 8–23)
CO2: 24 mmol/L (ref 22–32)
Calcium: 9.5 mg/dL (ref 8.9–10.3)
Chloride: 104 mmol/L (ref 98–111)
Creatinine, Ser: 2.01 mg/dL — ABNORMAL HIGH (ref 0.61–1.24)
GFR calc Af Amer: 37 mL/min — ABNORMAL LOW (ref >60)
GFR calc non Af Amer: 32 mL/min — ABNORMAL LOW (ref >60)
Glucose, Bld: 128 mg/dL — ABNORMAL HIGH (ref 70–99)
Potassium: 3.6 mmol/L (ref 3.5–5.1)
Sodium: 143 mmol/L (ref 135–145)

## 2019-02-09 MED ORDER — CLOPIDOGREL BISULFATE 75 MG PO TABS
75.0000 mg | ORAL_TABLET | Freq: Every day | ORAL | Status: DC
  Administered 2019-02-09 – 2019-02-10 (×2): 75 mg via ORAL
  Filled 2019-02-09 (×2): qty 1

## 2019-02-09 MED ORDER — GUAIFENESIN ER 600 MG PO TB12
1200.0000 mg | ORAL_TABLET | Freq: Two times a day (BID) | ORAL | Status: DC
  Administered 2019-02-09 – 2019-02-10 (×3): 1200 mg via ORAL
  Filled 2019-02-09 (×3): qty 2

## 2019-02-09 MED ORDER — CARVEDILOL 3.125 MG PO TABS
3.1250 mg | ORAL_TABLET | Freq: Two times a day (BID) | ORAL | Status: DC
  Administered 2019-02-09 – 2019-02-10 (×2): 3.125 mg via ORAL
  Filled 2019-02-09 (×2): qty 1

## 2019-02-09 MED ORDER — SPIRONOLACTONE 12.5 MG HALF TABLET
12.5000 mg | ORAL_TABLET | Freq: Every day | ORAL | Status: DC
  Administered 2019-02-09 – 2019-02-10 (×2): 12.5 mg via ORAL
  Filled 2019-02-09 (×2): qty 1

## 2019-02-09 MED ORDER — FUROSEMIDE 40 MG PO TABS
60.0000 mg | ORAL_TABLET | Freq: Every day | ORAL | Status: DC
  Administered 2019-02-10: 60 mg via ORAL
  Filled 2019-02-09 (×2): qty 1

## 2019-02-09 MED ORDER — ISOSORB DINITRATE-HYDRALAZINE 20-37.5 MG PO TABS
1.0000 | ORAL_TABLET | Freq: Three times a day (TID) | ORAL | Status: DC
  Administered 2019-02-09 – 2019-02-10 (×3): 1 via ORAL
  Filled 2019-02-09 (×3): qty 1

## 2019-02-09 NOTE — Progress Notes (Signed)
PROGRESS NOTE    [name]  FXT:[id number] DOB: 05/03/1941 DOA: 02/04/2019 PCP: Prince Solian, MD   Brief Narrative: [name] is a 78 y.o. male with history of chronic systolic heart failure last EF measured in 2017 was 35 to 40% with grade 1 diastolic dysfunction, history of stroke diabetes mellitus type 2, chronic kidney disease stage III. Patient presented secondary to shortness of breath.    Assessment & Plan:   Principal Problem:   Acute on chronic congestive heart failure (HCC) Active Problems:   CAD, NATIVE VESSEL   Essential hypertension   DM2 (diabetes mellitus, type 2) ([place])   Community acquired pneumonia of left lower lobe of lung (HCC)   CKD (chronic kidney disease) stage 3, GFR 30-59 ml/min (HCC)   Acute CHF (congestive heart failure) (HCC)   Acute on chronic combined systolic and heart failure Ischemic cardiomyopathy Last EF of 35-40% with grade 1 diastolic dysfunction per 2992 Transthoracic Echocardiogram. On Coreg, losartan spironolactone (not yet started) as an outpatient. Transthoracic Echocardiogram from 5/14 significant for worsened EF of 15-20% with diffuseshypokinesis. Last stress test in 2017 high risk but without ischemia. Weight down to 148.8 lbs (unsure if accurate). 2.9 L UOP over last 24 hours. S/p R/L heart cath with 2 vessel disease; consideration for CABG. -Continue Lasix (increased to 80 mg IV BID per cardiology) -Daily weights/strict in and out -Cardiology recommendations: possible TCTS consult and CABG, eventually Entresto, aspirin  Elevated troponin In setting of acute heart failure. Flat trend. No chest pain  Right upper lobe pneumonia Undetectable procalcitonin and no WBC, but with productive, grey cough/sputum and associated chest discomfort in that area. Blood cultures no growth to date. Sputum culture consistent with normal respiratory flora.  Patient with some wheezing and rhonchi that appear to be transmitted from upper airways.  -Continue Ceftriaxone/azithromycin -Mucinex  Diabetes mellitus, type 2 -Continue sliding scale  Acute kidney injury on CKD stage III Baseline creatinine of about 1.5 from three years ago. Up to 2.06 peak. In setting of fluid overload. 2.01 today. Appears this may be around his baseline. He is s/p heart cath on 5/15 -Continue lasix -Daily BMP  History of stroke -Continue Plavix and simvastatin  Chronic anemia Unknown etiology. Baseline of 9.2 from three years ago. Stable at 12. On iron supplementation -Outpatient follow-up  Insomnia Appears to be secondary to external factors, including noise. No breathing issues or anxiety precipitating insomnia. Patient declines ear plugs -Trazodone prn   DVT prophylaxis: Lovenox Code Status:   Code Status: Full Code Family Communication: Called wife via telephone, however, no answer Disposition Plan: Discharge home pending cardiology recommendations   Consultants:   Cardiology/Heart failure  Procedures:   02/05/2019: Transthoracic Echocardiogram IMPRESSIONS    1. The cavity size was normal. There is mildly increased left ventricular wall thickness. Left ventricular diastolic Doppler parameters are consistent with restrictive filling. Elevated left ventricular end-diastolic pressure Left ventricular diffuse  hypokinesis, EF 15-20%.  2. The right ventricle has moderately reduced systolic function. The cavity was normal. There is no increase in right ventricular wall thickness.  3. Left atrial size was moderately dilated.  4. Left pleural effusion is present.  5. Mild thickening of the mitral valve leaflet. Mitral valve regurgitation is moderate by color flow Doppler, suspect functional MR. No evidence of mitral valve stenosis.  6. The aortic valve is tricuspid. Moderate calcification of the aortic valve. Aortic valve regurgitation is trivial by color flow Doppler. No stenosis of the aortic valve.  7. Trivial pericardial  effusion is  present.  8. The inferior vena cava was dilated in size with <50% respiratory variability. PA systolic pressure 45 mmHg.   02/07/2019: R and L Heart Catheterization Conclusion     Mid RCA to Dist RCA lesion is 50% stenosed.  Prox RCA lesion is 40% stenosed.  Mid LM to Dist LM lesion is 80% stenosed.  RPDA lesion is 90% stenosed.  Ost Cx to Prox Cx lesion is 80% stenosed.  Dist LM to Ost LAD lesion is 60% stenosed.  Prox Cx to Mid Cx lesion is 50% stenosed.  Prox LAD to Mid LAD lesion is 70% stenosed.   Findings:  Ao = 136/76 (97) LV = 139/28 RA = 9 RV = 65/10 PA = 67/29 (48) PCW = 36 Fick cardiac output/index = 4.1/2.2 PVR = 3.0 WU SVR = 1721 Ao sat =  PA sat =  Assessment: 1. Severe 2v CAD with probable high grade, calcified distal left main lesion 2. EF 20-25% due to iCM 3. Elevated filling pressures with moderately reduced CO  Plan/Discussion:  Continue diuresis. Flow wire LM lesion next weak to confirm. Probable TCTS consult for CABG.      Antimicrobials:  Ceftriaxone  Azithromycin    Subjective: Continues to breathe well.  Again, did not sleep well overnight.  No chest pain or shortness of breath.  Eager to have surgery if it will improve his fatigue and weakness symptoms.  Objective: Vitals:   02/08/19 0548 02/08/19 0550 02/08/19 1959 02/09/19 0529  BP: 127/76  125/82 134/78  Pulse: 76  64 69  Resp:   16 18  Temp: 97.9 F (36.6 C)  97.6 F (36.4 C) 97.7 F (36.5 C)  TempSrc: Oral  Oral Oral  SpO2: 98%  95% 93%  Weight:  71.3 kg  67.5 kg  Height:        Intake/Output Summary (Last 24 hours) at 02/09/2019 1046 Last data filed at 02/09/2019 0754 Gross per 24 hour  Intake 240 ml  Output 2950 ml  Net -2710 ml   Filed Weights   02/07/19 0618 02/08/19 0550 02/09/19 0529  Weight: 73.3 kg 71.3 kg 67.5 kg    Examination:  General exam: Appears calm and comfortable Respiratory system: Transmitted upper airway sounds with  wheezing heard best over neck.  Respiratory effort normal. Cardiovascular system: S1 & S2 heard, RRR. No murmurs, rubs, gallops or clicks. Gastrointestinal system: Abdomen is nondistended, soft and nontender. No organomegaly or masses felt. Normal bowel sounds heard. Central nervous system: Alert and oriented. No focal neurological deficits. Extremities: 1+ lower extremity bilateral edema. No calf tenderness Skin: No cyanosis. No rashes Psychiatry: Judgement and insight appear normal. Mood & affect appropriate.      Data Reviewed: I have personally reviewed following labs and imaging studies  CBC: Recent Labs  Lab 02/04/19 2007 02/05/19 0230 02/07/19 1457 02/07/19 1504  WBC 4.8 5.7  --   --   NEUTROABS 3.3 4.1  --   --   HGB 12.0* 12.3* 11.9* 12.6*  12.2*  HCT 39.5 39.1 35.0* 37.0*  36.0*  MCV 96.6 94.2  --   --   PLT 184 191  --   --    Basic Metabolic Panel: Recent Labs  Lab 02/05/19 0230 02/06/19 0449 02/07/19 0401 02/07/19 1457 02/07/19 1504 02/08/19 0320 02/09/19 0520  NA 143 144 142 143 143  143 141 143  K 5.1 4.6 4.9 4.3 4.4  4.4 4.4 3.6  CL 111 113* 112*  --   --  109 104  CO2 23 21* 20*  --   --  22 24  GLUCOSE 178* 155* 116*  --   --  205* 128*  BUN 29* 32* 37*  --   --  38* 35*  CREATININE 2.06*  1.98* 1.96* 2.16*  --   --  2.05* 2.01*  CALCIUM 8.9 8.5* 8.7*  --   --  8.8* 9.5  MG 1.6*  --   --   --   --   --   --    GFR: Estimated Creatinine Clearance: 30.3 mL/min (A) (by C-G formula based on SCr of 2.01 mg/dL (H)). Liver Function Tests: Recent Labs  Lab 02/04/19 2007 02/05/19 0230  AST 49* 31  ALT 45* 41  ALKPHOS 60 60  BILITOT 0.6 0.4  PROT 5.6* 5.8*  ALBUMIN 3.3* 3.4*   No results for input(s): LIPASE, AMYLASE in the last 168 hours. No results for input(s): AMMONIA in the last 168 hours. Coagulation Profile: No results for input(s): INR, PROTIME in the last 168 hours. Cardiac Enzymes: Recent Labs  Lab 02/04/19 2007 02/05/19 0230  02/05/19 0736 02/05/19 1437  TROPONINI 0.03* 0.03* 0.03* 0.03*   BNP (last 3 results) No results for input(s): PROBNP in the last 8760 hours. HbA1C: No results for input(s): HGBA1C in the last 72 hours. CBG: Recent Labs  Lab 02/08/19 0735 02/08/19 1143 02/08/19 1633 02/08/19 2120 02/09/19 0722  GLUCAP 154* 196* 88 158* 107*   Lipid Profile: No results for input(s): CHOL, HDL, LDLCALC, TRIG, CHOLHDL, LDLDIRECT in the last 72 hours. Thyroid Function Tests: No results for input(s): TSH, T4TOTAL, FREET4, T3FREE, THYROIDAB in the last 72 hours. Anemia Panel: No results for input(s): VITAMINB12, FOLATE, FERRITIN, TIBC, IRON, RETICCTPCT in the last 72 hours. Sepsis Labs: Recent Labs  Lab 02/04/19 2008 02/04/19 2200 02/05/19 0230  PROCALCITON  --   --  <0.10  LATICACIDVEN 1.2 1.0  --     Recent Results (from the past 240 hour(s))  Urine culture     Status: None   Collection Time: 02/04/19  7:07 PM  Result Value Ref Range Status   Specimen Description URINE, RANDOM  Final   Special Requests NONE  Final   Culture   Final    NO GROWTH Performed at [facility], [address] [place], [place] [address]    Report Status 02/05/2019 FINAL  Final  SARS Coronavirus 2 (CEPHEID- Performed in [place] hospital lab), Hosp Order     Status: None   Collection Time: 02/04/19  7:30 PM  Result Value Ref Range Status   SARS Coronavirus 2 NEGATIVE NEGATIVE Final    Comment: (NOTE) If result is NEGATIVE SARS-CoV-2 target nucleic acids are NOT DETECTED. The SARS-CoV-2 RNA is generally detectable in upper and lower  respiratory specimens during the acute phase of infection. The lowest  concentration of SARS-CoV-2 viral copies this assay can detect is 250  copies / mL. A negative result does not preclude SARS-CoV-2 infection  and should not be used as the sole basis for treatment or other  patient management decisions.  A negative result may occur with  improper specimen  collection / handling, submission of specimen other  than nasopharyngeal swab, presence of viral mutation(s) within the  areas targeted by this assay, and inadequate number of viral copies  (<250 copies / mL). A negative result must be combined with clinical  observations, patient history, and epidemiological information. If result is POSITIVE SARS-CoV-2 target nucleic acids  are DETECTED. The SARS-CoV-2 RNA is generally detectable in upper and lower  respiratory specimens dur ing the acute phase of infection.  Positive  results are indicative of active infection with SARS-CoV-2.  Clinical  correlation with patient history and other diagnostic information is  necessary to determine patient infection status.  Positive results do  not rule out bacterial infection or co-infection with other viruses. If result is PRESUMPTIVE POSTIVE SARS-CoV-2 nucleic acids MAY BE PRESENT.   A presumptive positive result was obtained on the submitted specimen  and confirmed on repeat testing.  While 2019 novel coronavirus  (SARS-CoV-2) nucleic acids may be present in the submitted sample  additional confirmatory testing may be necessary for epidemiological  and / or clinical management purposes  to differentiate between  SARS-CoV-2 and other Sarbecovirus currently known to infect humans.  If clinically indicated additional testing with an alternate test  methodology [phone]) is advised. The SARS-CoV-2 RNA is generally  detectable in upper and lower respiratory sp ecimens during the acute  phase of infection. The expected result is Negative. Fact Sheet for Patients:  StrictlyIdeas.no Fact Sheet for Healthcare Providers: BankingDealers.co.za This test is not yet approved or cleared by the [place] FDA and has been authorized for detection and/or diagnosis of SARS-CoV-2 by FDA under an Emergency Use Authorization (EUA).  This EUA will remain in effect  (meaning this test can be used) for the duration of the COVID-19 declaration under Section 564(b)(1) of the Act, 21 U.S.C. section 360bbb-3(b)(1), unless the authorization is terminated or revoked sooner. Performed at [facility], [place] [address], [place], [place] [address]   Blood culture (routine x 2)     Status: None (Preliminary result)   Collection Time: 02/04/19  9:30 PM  Result Value Ref Range Status   Specimen Description BLOOD RIGHT ARM  Final   Special Requests   Final    BOTTLES DRAWN AEROBIC AND ANAEROBIC Blood Culture results may not be optimal due to an excessive volume of blood received in culture bottles   Culture   Final    NO GROWTH 4 DAYS Performed at [facility], [place] [address], [place], [place] [address]    Report Status PENDING  Incomplete  Blood culture (routine x 2)     Status: None (Preliminary result)   Collection Time: 02/04/19 10:02 PM  Result Value Ref Range Status   Specimen Description BLOOD RIGHT HAND  Final   Special Requests   Final    BOTTLES DRAWN AEROBIC ONLY Blood Culture adequate volume   Culture   Final    NO GROWTH 4 DAYS Performed at [facility], [place] [address] [place], [place] [address]    Report Status PENDING  Incomplete  Expectorated sputum assessment w rflx to resp cult     Status: None   Collection Time: 02/05/19  2:46 PM  Result Value Ref Range Status   Specimen Description EXPECTORATED SPUTUM  Final   Special Requests NONE  Final   Sputum evaluation   Final    THIS SPECIMEN IS ACCEPTABLE FOR SPUTUM CULTURE Performed at [facility], [place] [address], [place], [place] [address]    Report Status 02/05/2019 FINAL  Final  Culture, respiratory     Status: None   Collection Time: 02/05/19  2:46 PM  Result Value Ref Range Status   Specimen Description EXPECTORATED SPUTUM  Final   Special Requests NONE Reflexed from [id number]  Final   Gram Stain  Final    ABUNDANT WBC PRESENT, PREDOMINANTLY PMN RARE GRAM  POSITIVE COCCI    Culture   Final    RARE Consistent with normal respiratory flora. Performed at [facility], [place] [address], [place], [place] [address]    Report Status 02/08/2019 FINAL  Final         Radiology Studies: Dg Chest Port 1 View  Result Date: 02/08/2019 CLINICAL DATA:  Rhonchi EXAM: PORTABLE CHEST 1 VIEW COMPARISON:  02/04/2019 FINDINGS: No significant change in AP portable examination with small layering bilateral pleural effusions, left basilar atelectasis or consolidation, and mild interstitial opacity. No new or focal airspace opacity. Cardiomegaly. IMPRESSION: No significant change in AP portable examination with small layering bilateral pleural effusions, left basilar atelectasis or consolidation, and mild interstitial opacity. No new or focal airspace opacity. Cardiomegaly. Electronically Signed   By: Nashawn Candle M.D.   On: 02/08/2019 16:14        Scheduled Meds: . azithromycin  500 mg Oral Daily  . carvedilol  12.5 mg Oral BID WC  . enoxaparin (LOVENOX) injection  40 mg Subcutaneous Daily  . furosemide  80 mg Intravenous BID  . guaiFENesin  1,200 mg Oral BID  . insulin aspart  0-9 Units Subcutaneous TID WC  . iron polysaccharides  150 mg Oral BID  . montelukast  10 mg Oral QHS  . simvastatin  40 mg Oral Daily  . sodium chloride flush  3 mL Intravenous Q12H  . sodium chloride flush  3 mL Intravenous Q12H   Continuous Infusions: . sodium chloride    . cefTRIAXone (ROCEPHIN)  IV 1 g (02/08/19 2156)     LOS: 5 days     Cordelia Poche, MD [facility] 02/09/2019, 10:46 AM  If 7PM-7AM, please contact night-coverage [email or web address]

## 2019-02-09 NOTE — Progress Notes (Addendum)
Advanced Heart Failure Rounding Note   Subjective:    Cath 5/15 with likely high-grade calcified distal LM lesion. PCW 36 with CI 2.2  Remains on IV lasix.  With good urine output. Weight down another 9 pounds (13 pounds total). Breathing better. No orthopnea or PND. No CP. Feels weak.   Objective:   Weight Range:  Vital Signs:   Temp:  [97.6 F (36.4 C)-97.7 F (36.5 C)] 97.7 F (36.5 C) (05/17 0529) Pulse Rate:  [64-69] 69 (05/17 0529) Resp:  [16-18] 18 (05/17 0529) BP: (125-134)/(78-82) 134/78 (05/17 0529) SpO2:  [93 %-95 %] 93 % (05/17 0529) Weight:  [67.5 kg] 67.5 kg (05/17 0529) Last BM Date: 02/05/19  Weight change: Filed Weights   02/07/19 0618 02/08/19 0550 02/09/19 0529  Weight: 73.3 kg 71.3 kg 67.5 kg    Intake/Output:   Intake/Output Summary (Last 24 hours) at 02/09/2019 1335 Last data filed at 02/09/2019 1243 Gross per 24 hour  Intake 480 ml  Output 3400 ml  Net -2920 ml     Physical Exam: General:  Sitting in bed. Weak/frail appearing  No resp difficulty HEENT: normal Neck: supple. JVP flat. Carotids 2+ bilat; no bruits. No lymphadenopathy or thryomegaly appreciated. Cor: PMI laterally displaced. Regular rate & rhythm. No rubs, gallops or murmurs. Lungs: clear Abdomen: soft, nontender, nondistended. No hepatosplenomegaly. No bruits or masses. Good bowel sounds. Extremities: no cyanosis, clubbing, rash, edema R foot transmet amputation  Neuro: alert & orientedx3, cranial nerves grossly intact. moves all 4 extremities w/o difficulty. Affect pleasant   Telemetry: NSR 60-70s. Personally reviewed   Labs: Basic Metabolic Panel: Recent Labs  Lab 02/05/19 0230 02/06/19 0449 02/07/19 0401 02/07/19 1457 02/07/19 1504 02/08/19 0320 02/09/19 0520  NA 143 144 142 143 143  143 141 143  K 5.1 4.6 4.9 4.3 4.4  4.4 4.4 3.6  CL 111 113* 112*  --   --  109 104  CO2 23 21* 20*  --   --  22 24  GLUCOSE 178* 155* 116*  --   --  205* 128*  BUN 29*  32* 37*  --   --  38* 35*  CREATININE 2.06*  1.98* 1.96* 2.16*  --   --  2.05* 2.01*  CALCIUM 8.9 8.5* 8.7*  --   --  8.8* 9.5  MG 1.6*  --   --   --   --   --   --     Liver Function Tests: Recent Labs  Lab 02/04/19 2007 02/05/19 0230  AST 49* 31  ALT 45* 41  ALKPHOS 60 60  BILITOT 0.6 0.4  PROT 5.6* 5.8*  ALBUMIN 3.3* 3.4*   No results for input(s): LIPASE, AMYLASE in the last 168 hours. No results for input(s): AMMONIA in the last 168 hours.  CBC: Recent Labs  Lab 02/04/19 2007 02/05/19 0230 02/07/19 1457 02/07/19 1504  WBC 4.8 5.7  --   --   NEUTROABS 3.3 4.1  --   --   HGB 12.0* 12.3* 11.9* 12.6*  12.2*  HCT 39.5 39.1 35.0* 37.0*  36.0*  MCV 96.6 94.2  --   --   PLT 184 191  --   --     Cardiac Enzymes: Recent Labs  Lab 02/04/19 2007 02/05/19 0230 02/05/19 0736 02/05/19 1437  TROPONINI 0.03* 0.03* 0.03* 0.03*    BNP: BNP (last 3 results) Recent Labs    02/04/19 2008  BNP 2,222.4*    ProBNP (last 3 results)  No results for input(s): PROBNP in the last 8760 hours.    Other results:  Imaging: Dg Chest Port 1 View  Result Date: 02/08/2019 CLINICAL DATA:  Rhonchi EXAM: PORTABLE CHEST 1 VIEW COMPARISON:  02/04/2019 FINDINGS: No significant change in AP portable examination with small layering bilateral pleural effusions, left basilar atelectasis or consolidation, and mild interstitial opacity. No new or focal airspace opacity. Cardiomegaly. IMPRESSION: No significant change in AP portable examination with small layering bilateral pleural effusions, left basilar atelectasis or consolidation, and mild interstitial opacity. No new or focal airspace opacity. Cardiomegaly. Electronically Signed   By: Kylar Candle M.D.   On: 02/08/2019 16:14     Medications:     Scheduled Medications: . azithromycin  500 mg Oral Daily  . carvedilol  12.5 mg Oral BID WC  . enoxaparin (LOVENOX) injection  40 mg Subcutaneous Daily  . furosemide  80 mg Intravenous BID   . guaiFENesin  1,200 mg Oral BID  . insulin aspart  0-9 Units Subcutaneous TID WC  . iron polysaccharides  150 mg Oral BID  . montelukast  10 mg Oral QHS  . simvastatin  40 mg Oral Daily  . sodium chloride flush  3 mL Intravenous Q12H  . sodium chloride flush  3 mL Intravenous Q12H    Infusions: . sodium chloride    . cefTRIAXone (ROCEPHIN)  IV 1 g (02/08/19 2156)    PRN Medications: sodium chloride, acetaminophen **OR** acetaminophen, albuterol, ondansetron (ZOFRAN) IV, sodium chloride flush, traZODone   Assessment/Plan:   1. 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 Now presenting with SOB and LE edema. BNP 2222. CT Chest with pleural effusions. Echo this admission revealed EF 15-20%, G3DD, and diffuse hypokinesis. He has been diuresing with IV lasix 40mg  BID with net -747mL in the past 24 hours and -1.2L this admission. Weight 167lbs>161lbs. Trops 0.03 x3 is not consistent with ACS and EKG with chronic LBBB. Possible there is an ischemic component as patient noted to have mild to moderate CAD in 2011. - He has Class IV HF with volume overload and worsening LV function with EF 15-20% also severe RV dysfunction  - Cath 5/15 with likely high-grade distal LM lesion.  - I have reviewed case with Dr. Prescott Gum again today who has reviewed images. LAD only graftable vessel and given severe RV dysfunction, advanced age, fraility and CKD not felt to be CABG candidate. I have reviewed again with Dr. Burt Knack with regard to possibility of LM stenting. It is highly calcified lesion and with severely reduced EF would need probable Impella support but PAD likely precludes. Additionally, LV was down prior to CAD so may not improve with stenting I suspect medical management may be best.  - Volume status much improved. Stop IV lasix. Start po lasix in am (was not on at home) - LBBB may also be  playing a role and need to consider CRT-D though he may be too far along - Start Bidil - Cut carvedilol down to 3.125 bid (was on 25 bid) - Continue spiro - Was on losartan at home but will hold for now with marginal renal function. (GFR ~35). Can reconsider as outpatient. - hopefully will improve with more targeted HF management.  - [facility] follow-up at d/c - Possibly home in am  2. CAD: - No s/s angina - Cath 5/15 with likely high-grade distal LM  lesion. Medical therapy. See discussion above - On ASA. Resume plavix - Continue statin   3. RUL PNA: seen on CT Chest. Urine strep/legionella negative. COVID-19 negative. Sputum cx pending. On IV antibiotics  - Continue management per primary team  4. Acute on CKD3:  - labs 3 years ago with Cr 1.5. Suspect this is new baseline - Creatinine running 1.9-2.1 - Stable at 2.0 post-cath. Watch closely with diuresis  5. History of CVA: on plavix and statin - Continue plavix and statin. Resume plavix  6. DM type 2: on ISS - Continue management per primary team - May be candidate for SGLT2i if CrCl > 30    Length of Stay: 5     MD 02/09/2019, 1:35 PM  Advanced Heart Failure Team Pager [phone] (M-F; [place])  Please contact [facility] for night-coverage after hours (4p -7a ) and weekends on [email or web address]

## 2019-02-10 ENCOUNTER — Encounter (HOSPITAL_COMMUNITY): Payer: Self-pay | Admitting: Internal Medicine

## 2019-02-10 DIAGNOSIS — I5043 Acute on chronic combined systolic (congestive) and diastolic (congestive) heart failure: Secondary | ICD-10-CM

## 2019-02-10 LAB — BASIC METABOLIC PANEL
Anion gap: 11 (ref 5–15)
BUN: 38 mg/dL — ABNORMAL HIGH (ref 8–23)
CO2: 23 mmol/L (ref 22–32)
Calcium: 8.6 mg/dL — ABNORMAL LOW (ref 8.9–10.3)
Chloride: 105 mmol/L (ref 98–111)
Creatinine, Ser: 2.02 mg/dL — ABNORMAL HIGH (ref 0.61–1.24)
GFR calc Af Amer: 36 mL/min — ABNORMAL LOW (ref >60)
GFR calc non Af Amer: 31 mL/min — ABNORMAL LOW (ref >60)
Glucose, Bld: 128 mg/dL — ABNORMAL HIGH (ref 70–99)
Potassium: 4.1 mmol/L (ref 3.5–5.1)
Sodium: 139 mmol/L (ref 135–145)

## 2019-02-10 LAB — GLUCOSE, CAPILLARY
Glucose-Capillary: 144 mg/dL — ABNORMAL HIGH (ref 70–99)
Glucose-Capillary: 250 mg/dL — ABNORMAL HIGH (ref 70–99)

## 2019-02-10 MED ORDER — SPIRONOLACTONE 25 MG PO TABS: 12.5000 mg | ORAL_TABLET | Freq: Every day | ORAL | 0 refills | Status: DC

## 2019-02-10 MED ORDER — SODIUM CHLORIDE 0.9 % IV SOLN
1.0000 g | Freq: Once | INTRAVENOUS | Status: AC
  Administered 2019-02-10: 15:00:00 1 g via INTRAVENOUS
  Filled 2019-02-10: qty 10

## 2019-02-10 MED ORDER — CARVEDILOL 6.25 MG PO TABS: 6.2500 mg | ORAL_TABLET | Freq: Two times a day (BID) | ORAL | Status: DC

## 2019-02-10 MED ORDER — GUAIFENESIN ER 600 MG PO TB12: 1200.0000 mg | ORAL_TABLET | Freq: Two times a day (BID) | ORAL | Status: AC

## 2019-02-10 MED ORDER — ASPIRIN 81 MG PO TBEC: 81.0000 mg | DELAYED_RELEASE_TABLET | Freq: Every day | ORAL | Status: DC

## 2019-02-10 MED ORDER — ISOSORB DINITRATE-HYDRALAZINE 20-37.5 MG PO TABS: 1.0000 | ORAL_TABLET | Freq: Three times a day (TID) | ORAL | 0 refills | Status: DC

## 2019-02-10 MED ORDER — CARVEDILOL 6.25 MG PO TABS: 6.2500 mg | ORAL_TABLET | Freq: Two times a day (BID) | ORAL | 0 refills | Status: DC

## 2019-02-10 MED ORDER — ASPIRIN EC 81 MG PO TBEC
81.0000 mg | DELAYED_RELEASE_TABLET | Freq: Every day | ORAL | Status: DC
  Administered 2019-02-10: 13:00:00 81 mg via ORAL
  Filled 2019-02-10: qty 1

## 2019-02-10 MED ORDER — FUROSEMIDE 20 MG PO TABS: 60.0000 mg | ORAL_TABLET | Freq: Every day | ORAL | 0 refills | Status: DC

## 2019-02-10 MED FILL — CARVEDILOL 6.25 MG TABLET: 6.25 | 15 days supply | Qty: 30 | Fill #0

## 2019-02-10 MED FILL — BIDIL TABLET: 20-37.5 | 30 days supply | Qty: 90 | Fill #0

## 2019-02-10 MED FILL — FUROSEMIDE 20 MG TABS: 20 | 10 days supply | Qty: 30 | Fill #0

## 2019-02-10 MED FILL — SPIRONOLACTONE 25 MG TABS: 25 | 30 days supply | Qty: 15 | Fill #0

## 2019-02-10 NOTE — Discharge Summary (Signed)
Physician Discharge Summary  [name] OBS:[id number] DOB: [birth year] DOA: 02/04/2019  PCP: Prince Solian, MD  Admit date: 02/04/2019 Discharge date: 02/10/2019  Admitted From: Home Disposition: Home  Recommendations for Outpatient Follow-up:  1. Follow up with PCP in 1 week 2. Follow up with cardiology 3. Please obtain BMP/CBC in one week 4. Please follow up on the following pending results: None  Home Health: PT, RN Equipment/Devices: None  Discharge Condition: Stable CODE STATUS: Full code Diet recommendation: Heart healthy, renal diet   Brief/Interim Summary:  Admission HPI written by Rise Patience, MD   Chief Complaint: Shortness of breath.  HPI: [name] is a 78 y.o. male with history of chronic systolic heart failure last EF measured in 2017 was 35 to 40% with grade 1 diastolic dysfunction, history of stroke diabetes mellitus type 2, chronic kidney disease stage III presents to the ER because of increasing shortness of breath over the last 1 month.  Patient also noted increasing bilateral lower extremity edema.  Also has been exam productive cough denies any chest pain fever chills.  Denies any nausea vomiting or diarrhea.  Shortness of breath increased on exertion.  ED Course: In the ER chest x-ray shows left basilar opacity.  EKG shows sinus rhythm with LBBB.  Labs show creatinine of 1.87 troponin 0.03 BNP 2200 lactic acid 1.2 hemoglobin 12 platelets 184.  On exam patient has elevated JVD and bilateral lower extremity edema pitting up to the knees.  Was given Lasix 40 mg IV and admitted.  Since patient has productive cough and x-ray showing possibility of pneumonia was started on antibiotics.  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 On Coreg, losartan spironolactone (not yet started) as an  outpatient. Transthoracic Echocardiogram from 5/14 significant for worsened EF of 15-20% with diffuseshypokinesis. Last stress test in 2017 high risk but without ischemia. Weight on admission of 161 lbs, down to 148 lbs on discharge. Underwent R/L heart cath on 5/15 with 2 vessel disease. Deemed not a candidate for CABG secondary to risk factors. Discharged on Coreg, Lasix, spironolactone and Bidil. Losartan discontinued on discharge secondary to kidney disease. Aspirin and Plavix on discharge per cardiology recommendations.  Elevated troponin In setting of acute heart failure. Flat trend. No chest pain  Right upper lobe pneumonia Undetectable procalcitonin and no WBC, but with productive, grey cough/sputum and associated chest discomfort in that area. Blood cultures no growth to date. Sputum culture consistent with normal respiratory flora.  Patient with some wheezing and rhonchi that appear to be transmitted from upper airways. Improved with Mucinex. Completed antibiotics prior to discharge. Can obtain an outpatient chest x-ray in 3-4 weeks.  Diabetes mellitus, type 2 Resume home metformin and glimepiride. May be beneficial to get off of glimepiride if able secondary to age  Acute kidney injury on CKD stage III Baseline creatinine of about 1.5 from three years ago. Up to 2.06 peak. In setting of fluid overload. Baseline of 2 on discharge. Recommend nephrology follow-up.  History of stroke Continue Aspirin, Plavix and simvastatin  Chronic anemia Unknown etiology. Likely related to chronic kidney diseaseBaseline of 9.2 from three years ago. Stable at 12. On iron supplementation. Outpatient follow-up  Insomnia Appears to be secondary to external factors, including noise. No breathing issues or anxiety precipitating insomnia. Patient declines ear plugs. Will likely improve on discharge  Discharge Diagnoses:  Principal Problem:  Acute on chronic congestive heart failure (HCC) Active  Problems:   CAD, NATIVE VESSEL   Essential hypertension   DM2 (diabetes mellitus, type 2) ([place])   Community acquired pneumonia of left lower lobe of lung (HCC)   CKD (chronic kidney disease) stage 3, GFR 30-59 ml/min (HCC)   Acute CHF (congestive heart failure) [facility])    Discharge Instructions  Discharge Instructions    Call MD for:  difficulty breathing, headache or visual disturbances   Complete by:  As directed    Call MD for:  persistant dizziness or light-headedness   Complete by:  As directed    Diet - low sodium heart healthy   Complete by:  As directed    Heart Failure patients record your daily weight using the same scale at the same time of day   Complete by:  As directed    Increase activity slowly   Complete by:  As directed    STOP any activity that causes chest pain, shortness of breath, dizziness, sweating, or exessive weakness   Complete by:  As directed      Allergies as of 02/10/2019   No Known Allergies     Medication List    STOP taking these medications   losartan 100 MG tablet Commonly known as:  COZAAR     TAKE these medications   aspirin 81 MG EC tablet Take 1 tablet (81 mg total) by mouth daily. Start taking on:  Feb 11, 2019   carvedilol 6.25 MG tablet Commonly known as:  COREG Take 1 tablet (6.25 mg total) by mouth 2 (two) times daily with a meal. What changed:    medication strength  how much to take  when to take this   clopidogrel 75 MG tablet Commonly known as:  PLAVIX Take 75 mg by mouth daily.   furosemide 20 MG tablet Commonly known as:  LASIX Take 3 tablets (60 mg total) by mouth daily. Start taking on:  Feb 11, 2019   glimepiride 1 MG tablet Commonly known as:  AMARYL Take 0.5 mg by mouth 2 (two) times daily.   guaiFENesin 600 MG 12 hr tablet Commonly known as:  MUCINEX Take 2 tablets (1,200 mg total) by mouth 2 (two) times daily for 3 days.   isosorbide-hydrALAZINE 20-37.5 MG tablet Commonly known as:  BIDIL  Take 1 tablet by mouth 3 (three) times daily.   metFORMIN 1000 MG tablet Commonly known as:  GLUCOPHAGE Take 1,000 mg by mouth 2 (two) times daily with a meal.   montelukast 10 MG tablet Commonly known as:  SINGULAIR Take 10 mg by mouth at bedtime.   Niferex Tabs Take 150 mg by mouth daily. What changed:  when to take this   simvastatin 40 MG tablet Commonly known as:  ZOCOR Take 40 mg by mouth daily.   spironolactone 25 MG tablet Commonly known as:  ALDACTONE Take 0.5 tablets (12.5 mg total) by mouth daily.      Follow-up Information    [facility] Follow up on 02/19/2019.   Specialty:  Cardiology Why:  10:00 am. Use [address] off [address] and turn right into garage. Code for May is 8006. Check in on first floor.  Contact information: [address] [phone] [place]       Prince Solian, MD. Schedule an appointment as soon as possible for a visit in 1 week(s).   Specialty:  Internal Medicine Contact information: [address] [address] [phone]        Edrick Oh, MD. Schedule an appointment as soon as possible for a visit in 1 week(s).   Specialty:  Nephrology Why:  Chronic kidney disease Contact information: [place] [address] [phone]          No Known Allergies  Consultations:  Cardiology   Procedures/Studies: Ct Chest Wo Contrast  Result Date: 02/05/2019 CLINICAL DATA:  78 year old male with shortness of breath. Negative for COVID-19 yesterday. Acute on chronic heart failure. Productive cough, query pneumonia. EXAM: CT CHEST WITHOUT CONTRAST TECHNIQUE: Multidetector CT imaging of the chest was performed following the standard protocol without IV contrast. COMPARISON:  Portable chest 512 and earlier. FINDINGS: Cardiovascular: Mild-to-moderate cardiomegaly with extensive coronary artery calcified atherosclerosis  and/or stents. No pericardial effusion. Mild Calcified aortic atherosclerosis. Vascular patency is not evaluated in the absence of IV contrast. Mediastinum/Nodes: Negative noncontrast thoracic inlet. Mild reactive appearing mediastinal lymph nodes. Lungs/Pleura: Moderate bilateral layering pleural effusions with simple fluid density. Superimposed compressive atelectasis. Major airways are patent but there is a small volume of retained secretions in the right mainstem bronchus and bronchus intermedius on series 4, image 67. There is mild bilateral lower and middle lobe peribronchial thickening. There is non dependent atelectasis or consolidation in the lingula with air bronchograms on series 4, image 92. There is patchy and peripheral non dependent ground-glass opacity in the right upper lobe on series 4, image 30. There is similar subtle peribronchial ground-glass opacity in the upper lobe on image 46. Upper Abdomen: Negative visible noncontrast liver, spleen, pancreas, bowel. Nonspecific perinephric stranding. The visible renal collecting systems are nondilated. Musculoskeletal: No acute osseous abnormality identified. IMPRESSION: 1. Moderate layering pleural effusions with compressive atelectasis. 2. Superimposed peribronchial consolidation in the lingula, as well as patchy and peripheral ground-glass opacity in the right upper lobe. These findings are nonspecific but suspicious for superimposed bronchopneumonia. 3. Mild to moderate cardiomegaly with extensive calcified coronary artery atherosclerosis. Electronically Signed   By: Genevie Ann M.D.   On: 02/05/2019 06:48   Dg Chest Port 1 View  Result Date: 02/08/2019 CLINICAL DATA:  [place] EXAM: PORTABLE CHEST 1 VIEW COMPARISON:  02/04/2019 FINDINGS: No significant change in AP portable examination with small layering bilateral pleural effusions, left basilar atelectasis or consolidation, and mild interstitial opacity. No new or focal airspace opacity.  Cardiomegaly. IMPRESSION: No significant change in AP portable examination with small layering bilateral pleural effusions, left basilar atelectasis or consolidation, and mild interstitial opacity. No new or focal airspace opacity. Cardiomegaly. Electronically Signed   By: Ida Candle M.D.   On: 02/08/2019 16:14   Dg Chest Portable 1 View  Result Date: 02/04/2019 CLINICAL DATA:  78 year old male with shortness of breath. EXAM: PORTABLE CHEST 1 VIEW COMPARISON:  Chest radiograph dated 02/27/2016 FINDINGS: There is shallow inspiration with bibasilar atelectasis. Increased density at the left lung base may represent developing infiltrate. There is no pleural effusion or pneumothorax. The cardiac silhouette is within normal limits. No acute osseous pathology. IMPRESSION: Shallow inspiration with bibasilar atelectasis. Left lung base density concerning for pneumonia. Clinical correlation is recommended. Electronically Signed   By: Anner Crete M.D.   On: 02/04/2019 19:31   Vas [place] Lower Extremity Venous (dvt)  Result Date: 02/05/2019  Lower Venous Study Indications: Edema. Other Indications: PVD, CAD. Performing Technologist: Oda Cogan RDMS, RVT  Examination Guidelines: A complete evaluation includes B-mode imaging, spectral Doppler, color Doppler, and power Doppler as needed of all  accessible portions of each vessel. Bilateral testing is considered an integral part of a complete examination. Limited examinations for reoccurring indications may be performed as noted.  +---------+---------------+---------+-----------+----------+-------+ RIGHT    CompressibilityPhasicitySpontaneityPropertiesSummary +---------+---------------+---------+-----------+----------+-------+ CFV      Full           Yes      Yes                          +---------+---------------+---------+-----------+----------+-------+ SFJ      Full                                                  +---------+---------------+---------+-----------+----------+-------+ FV Prox  Full                                                 +---------+---------------+---------+-----------+----------+-------+ FV Mid   Full                                                 +---------+---------------+---------+-----------+----------+-------+ FV DistalFull                                                 +---------+---------------+---------+-----------+----------+-------+ PFV      Full                                                 +---------+---------------+---------+-----------+----------+-------+ POP      Full           Yes      Yes                          +---------+---------------+---------+-----------+----------+-------+ PTV      Full                                                 +---------+---------------+---------+-----------+----------+-------+ PERO     Full                                                 +---------+---------------+---------+-----------+----------+-------+   +---------+---------------+---------+-----------+----------+-------+ LEFT     CompressibilityPhasicitySpontaneityPropertiesSummary +---------+---------------+---------+-----------+----------+-------+ CFV      Full           Yes      Yes                          +---------+---------------+---------+-----------+----------+-------+ SFJ      Full                                                 +---------+---------------+---------+-----------+----------+-------+  FV Prox  Full                                                 +---------+---------------+---------+-----------+----------+-------+ FV Mid   Full                                                 +---------+---------------+---------+-----------+----------+-------+ FV DistalFull                                                 +---------+---------------+---------+-----------+----------+-------+ PFV      Full                                                  +---------+---------------+---------+-----------+----------+-------+ POP      Full           Yes      Yes                          +---------+---------------+---------+-----------+----------+-------+ PTV      Full                                                 +---------+---------------+---------+-----------+----------+-------+ PERO     Full                                                 +---------+---------------+---------+-----------+----------+-------+    Summary: Right: There is no evidence of deep vein thrombosis in the lower extremity. No cystic structure found in the popliteal fossa. Left: There is no evidence of deep vein thrombosis in the lower extremity. No cystic structure found in the popliteal fossa.  *See table(s) above for measurements and observations. Electronically signed by Harold Barban MD on 02/05/2019 at 3:33:22 PM.    Final      02/05/2019: Transthoracic Echocardiogram IMPRESSIONS   1. The cavity size was normal. There is mildly increased left ventricular wall thickness. Left ventricular diastolic Doppler parameters are consistent with restrictive filling. Elevated left ventricular end-diastolic pressure Left ventricular diffuse  hypokinesis, EF 15-20%. 2. The right ventricle has moderately reduced systolic function. The cavity was normal. There is no increase in right ventricular wall thickness. 3. Left atrial size was moderately dilated. 4. Left pleural effusion is present. 5. Mild thickening of the mitral valve leaflet. Mitral valve regurgitation is moderate by color flow Doppler, suspect functional MR. No evidence of mitral valve stenosis. 6. The aortic valve is tricuspid. Moderate calcification of the aortic valve. Aortic valve regurgitation is trivial by color flow Doppler. No stenosis of the aortic valve. 7. Trivial pericardial effusion is present. 8. The inferior vena cava was dilated in  size with <50% respiratory variability.  PA systolic pressure 45 mmHg.   02/07/2019: R and L Heart Catheterization Conclusion     Mid RCA to Dist RCA lesion is 50% stenosed.  Prox RCA lesion is 40% stenosed.  Mid LM to Dist LM lesion is 80% stenosed.  RPDA lesion is 90% stenosed.  Ost Cx to Prox Cx lesion is 80% stenosed.  Dist LM to Ost LAD lesion is 60% stenosed.  Prox Cx to Mid Cx lesion is 50% stenosed.  Prox LAD to Mid LAD lesion is 70% stenosed.      Subjective: Getting more sputum up.  Discharge Exam: Vitals:   02/10/19 0547 02/10/19 1007  BP: 118/70 133/73  Pulse: 71 77  Resp:    Temp: 98 F (36.7 C)   SpO2: 92%    Vitals:   02/09/19 1354 02/09/19 2152 02/10/19 0547 02/10/19 1007  BP: 104/63 (!) 110/51 118/70 133/73  Pulse: 62 67 71 77  Resp:      Temp: (!) 97.5 F (36.4 C) 98.2 F (36.8 C) 98 F (36.7 C)   TempSrc: Oral Oral Oral   SpO2: 97% 93% 92%   Weight:   67.1 kg   Height:        General: Pt is alert, awake, not in acute distress Cardiovascular: RRR, S1/S2 +, no rubs, no gallops Respiratory: CTA bilaterally, no wheezing, no rhonchi Abdominal: Soft, NT, ND, bowel sounds + Extremities: 1+ LE pitting edema R>L, no cyanosis    The results of significant diagnostics from this hospitalization (including imaging, microbiology, ancillary and laboratory) are listed below for reference.     Microbiology: Recent Results (from the past 240 hour(s))  Urine culture     Status: None   Collection Time: 02/04/19  7:07 PM  Result Value Ref Range Status   Specimen Description URINE, RANDOM  Final   Special Requests NONE  Final   Culture   Final    NO GROWTH Performed at [facility], [address] [place], [place] [address]    Report Status 02/05/2019 FINAL  Final  SARS Coronavirus 2 (CEPHEID- Performed in [place] hospital lab), Hosp Order     Status: None   Collection Time: 02/04/19  7:30 PM  Result Value Ref Range Status    SARS Coronavirus 2 NEGATIVE NEGATIVE Final    Comment: (NOTE) If result is NEGATIVE SARS-CoV-2 target nucleic acids are NOT DETECTED. The SARS-CoV-2 RNA is generally detectable in upper and lower  respiratory specimens during the acute phase of infection. The lowest  concentration of SARS-CoV-2 viral copies this assay can detect is 250  copies / mL. A negative result does not preclude SARS-CoV-2 infection  and should not be used as the sole basis for treatment or other  patient management decisions.  A negative result may occur with  improper specimen collection / handling, submission of specimen other  than nasopharyngeal swab, presence of viral mutation(s) within the  areas targeted by this assay, and inadequate number of viral copies  (<250 copies / mL). A negative result must be combined with clinical  observations, patient history, and epidemiological information. If result is POSITIVE SARS-CoV-2 target nucleic acids are DETECTED. The SARS-CoV-2 RNA is generally detectable in upper and lower  respiratory specimens dur ing the acute phase of infection.  Positive  results are indicative of active infection with SARS-CoV-2.  Clinical  correlation with patient history and other diagnostic information is  necessary to determine patient infection status.  Positive results do  not rule  out bacterial infection or co-infection with other viruses. If result is PRESUMPTIVE POSTIVE SARS-CoV-2 nucleic acids MAY BE PRESENT.   A presumptive positive result was obtained on the submitted specimen  and confirmed on repeat testing.  While 2019 novel coronavirus  (SARS-CoV-2) nucleic acids may be present in the submitted sample  additional confirmatory testing may be necessary for epidemiological  and / or clinical management purposes  to differentiate between  SARS-CoV-2 and other Sarbecovirus currently known to infect humans.  If clinically indicated additional testing with an alternate test   methodology [phone]) is advised. The SARS-CoV-2 RNA is generally  detectable in upper and lower respiratory sp ecimens during the acute  phase of infection. The expected result is Negative. Fact Sheet for Patients:  StrictlyIdeas.no Fact Sheet for Healthcare Providers: BankingDealers.co.za This test is not yet approved or cleared by the [place] FDA and has been authorized for detection and/or diagnosis of SARS-CoV-2 by FDA under an Emergency Use Authorization (EUA).  This EUA will remain in effect (meaning this test can be used) for the duration of the COVID-19 declaration under Section 564(b)(1) of the Act, 21 U.S.C. section 360bbb-3(b)(1), unless the authorization is terminated or revoked sooner. Performed at [facility], [place] [address], [place], [place] [address]   Blood culture (routine x 2)     Status: None   Collection Time: 02/04/19  9:30 PM  Result Value Ref Range Status   Specimen Description BLOOD RIGHT ARM  Final   Special Requests   Final    BOTTLES DRAWN AEROBIC AND ANAEROBIC Blood Culture results may not be optimal due to an excessive volume of blood received in culture bottles   Culture   Final    NO GROWTH 5 DAYS Performed at [facility], [place] [address], [place], [place] [address]    Report Status 02/09/2019 FINAL  Final  Blood culture (routine x 2)     Status: None   Collection Time: 02/04/19 10:02 PM  Result Value Ref Range Status   Specimen Description BLOOD RIGHT HAND  Final   Special Requests   Final    BOTTLES DRAWN AEROBIC ONLY Blood Culture adequate volume   Culture   Final    NO GROWTH 5 DAYS Performed at [facility], [place] [address] [place], [place] [address]    Report Status 02/09/2019 FINAL  Final  Expectorated sputum assessment w rflx to resp cult     Status: None   Collection Time: 02/05/19  2:46 PM  Result Value Ref Range Status   Specimen Description EXPECTORATED  SPUTUM  Final   Special Requests NONE  Final   Sputum evaluation   Final    THIS SPECIMEN IS ACCEPTABLE FOR SPUTUM CULTURE Performed at [facility], [place] [address] [place], [place] [address]    Report Status 02/05/2019 FINAL  Final  Culture, respiratory     Status: None   Collection Time: 02/05/19  2:46 PM  Result Value Ref Range Status   Specimen Description EXPECTORATED SPUTUM  Final   Special Requests NONE Reflexed from [id number]  Final   Gram Stain   Final    ABUNDANT WBC PRESENT, PREDOMINANTLY PMN RARE GRAM POSITIVE COCCI    Culture   Final    RARE Consistent with normal respiratory flora. Performed at [facility], [place] [address], [place], [place] [address]    Report Status 02/08/2019 FINAL  Final     Labs: BNP (last 3  results) Recent Labs    02/04/19 2008  BNP 4,174.0*   Basic Metabolic Panel: Recent Labs  Lab 02/05/19 0230 02/06/19 0449 02/07/19 0401 02/07/19 1457 02/07/19 1504 02/08/19 0320 02/09/19 0520 02/10/19 0648  NA 143 144 142 143 143  143 141 143 139  K 5.1 4.6 4.9 4.3 4.4  4.4 4.4 3.6 4.1  CL 111 113* 112*  --   --  109 104 105  CO2 23 21* 20*  --   --  22 24 23   GLUCOSE 178* 155* 116*  --   --  205* 128* 128*  BUN 29* 32* 37*  --   --  38* 35* 38*  CREATININE 2.06*  1.98* 1.96* 2.16*  --   --  2.05* 2.01* 2.02*  CALCIUM 8.9 8.5* 8.7*  --   --  8.8* 9.5 8.6*  MG 1.6*  --   --   --   --   --   --   --    Liver Function Tests: Recent Labs  Lab 02/04/19 2007 02/05/19 0230  AST 49* 31  ALT 45* 41  ALKPHOS 60 60  BILITOT 0.6 0.4  PROT 5.6* 5.8*  ALBUMIN 3.3* 3.4*   No results for input(s): LIPASE, AMYLASE in the last 168 hours. No results for input(s): AMMONIA in the last 168 hours. CBC: Recent Labs  Lab 02/04/19 2007 02/05/19 0230 02/07/19 1457 02/07/19 1504  WBC 4.8 5.7  --   --   NEUTROABS 3.3 4.1  --   --   HGB 12.0* 12.3* 11.9* 12.6*  12.2*  HCT 39.5 39.1 35.0* 37.0*  36.0*  MCV 96.6 94.2  --   --   PLT 184  191  --   --    Cardiac Enzymes: Recent Labs  Lab 02/04/19 2007 02/05/19 0230 02/05/19 0736 02/05/19 1437  TROPONINI 0.03* 0.03* 0.03* 0.03*   BNP: Invalid input(s): POCBNP CBG: Recent Labs  Lab 02/09/19 1113 02/09/19 1643 02/09/19 2151 02/10/19 0742 02/10/19 1128  GLUCAP 204* 133* 223* 144* 250*   D-Dimer No results for input(s): DDIMER in the last 72 hours. Hgb A1c No results for input(s): HGBA1C in the last 72 hours. Lipid Profile No results for input(s): CHOL, HDL, LDLCALC, TRIG, CHOLHDL, LDLDIRECT in the last 72 hours. Thyroid function studies No results for input(s): TSH, T4TOTAL, T3FREE, THYROIDAB in the last 72 hours.  Invalid input(s): FREET3 Anemia work up No results for input(s): VITAMINB12, FOLATE, FERRITIN, TIBC, IRON, RETICCTPCT in the last 72 hours. Urinalysis    Component Value Date/Time   COLORURINE YELLOW 02/04/2019 1930   APPEARANCEUR CLEAR 02/04/2019 1930   LABSPEC 1.019 02/04/2019 1930   PHURINE 5.0 02/04/2019 1930   GLUCOSEU NEGATIVE 02/04/2019 1930   HGBUR NEGATIVE 02/04/2019 1930   BILIRUBINUR NEGATIVE 02/04/2019 [place] NEGATIVE 02/04/2019 1930   PROTEINUR 100 (A) 02/04/2019 1930   UROBILINOGEN 0.2 01/23/2014 1600   NITRITE NEGATIVE 02/04/2019 1930   LEUKOCYTESUR NEGATIVE 02/04/2019 1930   Sepsis Labs Invalid input(s): PROCALCITONIN,  WBC,  LACTICIDVEN Microbiology Recent Results (from the past 240 hour(s))  Urine culture     Status: None   Collection Time: 02/04/19  7:07 PM  Result Value Ref Range Status   Specimen Description URINE, RANDOM  Final   Special Requests NONE  Final   Culture   Final    NO GROWTH Performed at [facility], [place] [address] [place], [place] [address]    Report Status 02/05/2019 FINAL  Final  SARS Coronavirus 2 (CEPHEID- Performed in [place] hospital lab), Hosp Order     Status: None   Collection Time: 02/04/19  7:30 PM  Result Value Ref Range Status   SARS Coronavirus 2 NEGATIVE  NEGATIVE Final    Comment: (NOTE) If result is NEGATIVE SARS-CoV-2 target nucleic acids are NOT DETECTED. The SARS-CoV-2 RNA is generally detectable in upper and lower  respiratory specimens during the acute phase of infection. The lowest  concentration of SARS-CoV-2 viral copies this assay can detect is 250  copies / mL. A negative result does not preclude SARS-CoV-2 infection  and should not be used as the sole basis for treatment or other  patient management decisions.  A negative result may occur with  improper specimen collection / handling, submission of specimen other  than nasopharyngeal swab, presence of viral mutation(s) within the  areas targeted by this assay, and inadequate number of viral copies  (<250 copies / mL). A negative result must be combined with clinical  observations, patient history, and epidemiological information. If result is POSITIVE SARS-CoV-2 target nucleic acids are DETECTED. The SARS-CoV-2 RNA is generally detectable in upper and lower  respiratory specimens dur ing the acute phase of infection.  Positive  results are indicative of active infection with SARS-CoV-2.  Clinical  correlation with patient history and other diagnostic information is  necessary to determine patient infection status.  Positive results do  not rule out bacterial infection or co-infection with other viruses. If result is PRESUMPTIVE POSTIVE SARS-CoV-2 nucleic acids MAY BE PRESENT.   A presumptive positive result was obtained on the submitted specimen  and confirmed on repeat testing.  While 2019 novel coronavirus  (SARS-CoV-2) nucleic acids may be present in the submitted sample  additional confirmatory testing may be necessary for epidemiological  and / or clinical management purposes  to differentiate between  SARS-CoV-2 and other Sarbecovirus currently known to infect humans.  If clinically indicated additional testing with an alternate test  methodology [phone]) is  advised. The SARS-CoV-2 RNA is generally  detectable in upper and lower respiratory sp ecimens during the acute  phase of infection. The expected result is Negative. Fact Sheet for Patients:  StrictlyIdeas.no Fact Sheet for Healthcare Providers: BankingDealers.co.za This test is not yet approved or cleared by the [place] FDA and has been authorized for detection and/or diagnosis of SARS-CoV-2 by FDA under an Emergency Use Authorization (EUA).  This EUA will remain in effect (meaning this test can be used) for the duration of the COVID-19 declaration under Section 564(b)(1) of the Act, 21 U.S.C. section 360bbb-3(b)(1), unless the authorization is terminated or revoked sooner. Performed at [facility], [place] [address] [place], [place] [address]   Blood culture (routine x 2)     Status: None   Collection Time: 02/04/19  9:30 PM  Result Value Ref Range Status   Specimen Description BLOOD RIGHT ARM  Final   Special Requests   Final    BOTTLES DRAWN AEROBIC AND ANAEROBIC Blood Culture results may not be optimal due to an excessive volume of blood received in culture bottles   Culture   Final    NO GROWTH 5 DAYS Performed at [facility], [place] [address], [place], [place] [address]    Report Status 02/09/2019 FINAL  Final  Blood culture (routine x 2)     Status: None   Collection Time: 02/04/19 10:02 PM  Result Value Ref Range Status   Specimen Description BLOOD RIGHT  HAND  Final   Special Requests   Final    BOTTLES DRAWN AEROBIC ONLY Blood Culture adequate volume   Culture   Final    NO GROWTH 5 DAYS Performed at [facility], [address] [place], [place] [address]    Report Status 02/09/2019 FINAL  Final  Expectorated sputum assessment w rflx to resp cult     Status: None   Collection Time: 02/05/19  2:46 PM  Result Value Ref Range Status   Specimen Description EXPECTORATED SPUTUM  Final   Special  Requests NONE  Final   Sputum evaluation   Final    THIS SPECIMEN IS ACCEPTABLE FOR SPUTUM CULTURE Performed at [facility], [place] [address], [place], [place] [address]    Report Status 02/05/2019 FINAL  Final  Culture, respiratory     Status: None   Collection Time: 02/05/19  2:46 PM  Result Value Ref Range Status   Specimen Description EXPECTORATED SPUTUM  Final   Special Requests NONE Reflexed from [id number]  Final   Gram Stain   Final    ABUNDANT WBC PRESENT, PREDOMINANTLY PMN RARE GRAM POSITIVE COCCI    Culture   Final    RARE Consistent with normal respiratory flora. Performed at [facility], [place] [address] [place], [place] [address]    Report Status 02/08/2019 FINAL  Final     Time coordinating discharge: Over 30 minutes  SIGNED:   Cordelia Poche, MD [facility] 02/10/2019, 1:46 PM

## 2019-02-10 NOTE — TOC Transition Note (Signed)
Transition of Care [facility]) - CM/SW Discharge Note   Patient Details  Name: [name] MRN: [id number] Date of Birth: 1941/06/23  Transition of Care [facility]) CM/SW Contact:  Bethena Roys, RN Phone Number: 02/10/2019, 2:18 PM   Clinical Narrative: Pt presented for Pneumonia. Plan to transition home today. Pt declined [place]- however patient wants outpatient PT Services- ambulatory referral sent to [facility]. Office to contact patient if he is eligible for services. No further needs from CM at this time.       Final next level of care: Home/Self Care Barriers to Discharge: No Barriers Identified   Patient Goals and CMS Choice     Choice offered to / list presented to : NA   Discharge Plan and Services In-house Referral: NA Discharge Planning Services: NA Post Acute Care Choice: NA          DME Arranged: N/A DME Agency: NA       HH Arranged: NA HH Agency: NA    Readmission Risk Interventions No flowsheet data found.

## 2019-02-10 NOTE — Progress Notes (Signed)
Patient ID: [name], male   DOB: [birth year], 78 y.o.   MRN: [id number]    Advanced Heart Failure Rounding Note   Subjective:    Cath 5/15 with likely high-grade calcified distal LM lesion. PCW 36 with CI 2.2  Now on po Lasix with stable creatinine at 2.0.  He walked in hall with mild dyspnea at end of walk.   Objective:   Weight Range:  Vital Signs:   Temp:  [97.5 F (36.4 C)-98.2 F (36.8 C)] 98 F (36.7 C) (05/18 0547) Pulse Rate:  [62-77] 77 (05/18 1007) BP: (104-133)/(51-73) 133/73 (05/18 1007) SpO2:  [92 %-97 %] 92 % (05/18 0547) Weight:  [67.1 kg] 67.1 kg (05/18 0547) Last BM Date: 02/07/19  Weight change: Filed Weights   02/08/19 0550 02/09/19 0529 02/10/19 0547  Weight: 71.3 kg 67.5 kg 67.1 kg    Intake/Output:   Intake/Output Summary (Last 24 hours) at 02/10/2019 1103 Last data filed at 02/10/2019 1000 Gross per 24 hour  Intake 800 ml  Output 750 ml  Net 50 ml     Physical Exam: General: NAD Neck: No JVD, no thyromegaly or thyroid nodule.  Lungs: Clear to auscultation bilaterally with normal respiratory effort. CV: Nondisplaced PMI.  Heart regular S1/S2 with paradoxically split S2, no S3/S4, no murmur.  1+ ankle edema.  No carotid bruit.  Normal pedal pulses.  Abdomen: Soft, nontender, no hepatosplenomegaly, no distention.  Skin: Intact without lesions or rashes.  Neurologic: Alert and oriented x 3.  Psych: Normal affect. Extremities: No clubbing or cyanosis.  HEENT: Normal.   Telemetry: NSR 60-70s, 1 run NSVT. Personally reviewed   Labs: Basic Metabolic Panel: Recent Labs  Lab 02/05/19 0230 02/06/19 0449 02/07/19 0401 02/07/19 1457 02/07/19 1504 02/08/19 0320 02/09/19 0520 02/10/19 0648  NA 143 144 142 143 143  143 141 143 139  K 5.1 4.6 4.9 4.3 4.4  4.4 4.4 3.6 4.1  CL 111 113* 112*  --   --  109 104 105  CO2 23 21* 20*  --   --  22 24 23   GLUCOSE 178* 155* 116*  --   --  205* 128* 128*  BUN 29* 32* 37*  --   --  38* 35* 38*   CREATININE 2.06*  1.98* 1.96* 2.16*  --   --  2.05* 2.01* 2.02*  CALCIUM 8.9 8.5* 8.7*  --   --  8.8* 9.5 8.6*  MG 1.6*  --   --   --   --   --   --   --     Liver Function Tests: Recent Labs  Lab 02/04/19 2007 02/05/19 0230  AST 49* 31  ALT 45* 41  ALKPHOS 60 60  BILITOT 0.6 0.4  PROT 5.6* 5.8*  ALBUMIN 3.3* 3.4*   No results for input(s): LIPASE, AMYLASE in the last 168 hours. No results for input(s): AMMONIA in the last 168 hours.  CBC: Recent Labs  Lab 02/04/19 2007 02/05/19 0230 02/07/19 1457 02/07/19 1504  WBC 4.8 5.7  --   --   NEUTROABS 3.3 4.1  --   --   HGB 12.0* 12.3* 11.9* 12.6*  12.2*  HCT 39.5 39.1 35.0* 37.0*  36.0*  MCV 96.6 94.2  --   --   PLT 184 191  --   --     Cardiac Enzymes: Recent Labs  Lab 02/04/19 2007 02/05/19 0230 02/05/19 0736 02/05/19 1437  TROPONINI 0.03* 0.03* 0.03* 0.03*    BNP: BNP (last  3 results) Recent Labs    02/04/19 2008  BNP 2,222.4*    ProBNP (last 3 results) No results for input(s): PROBNP in the last 8760 hours.    Other results:  Imaging: Dg Chest Port 1 View  Result Date: 02/08/2019 CLINICAL DATA:  Rhonchi EXAM: PORTABLE CHEST 1 VIEW COMPARISON:  02/04/2019 FINDINGS: No significant change in AP portable examination with small layering bilateral pleural effusions, left basilar atelectasis or consolidation, and mild interstitial opacity. No new or focal airspace opacity. Cardiomegaly. IMPRESSION: No significant change in AP portable examination with small layering bilateral pleural effusions, left basilar atelectasis or consolidation, and mild interstitial opacity. No new or focal airspace opacity. Cardiomegaly. Electronically Signed   By: Asberry Candle M.D.   On: 02/08/2019 16:14     Medications:     Scheduled Medications: . azithromycin  500 mg Oral Daily  . carvedilol  6.25 mg Oral BID WC  . clopidogrel  75 mg Oral Daily  . enoxaparin (LOVENOX) injection  40 mg Subcutaneous Daily  . furosemide   60 mg Oral Daily  . guaiFENesin  1,200 mg Oral BID  . insulin aspart  0-9 Units Subcutaneous TID WC  . iron polysaccharides  150 mg Oral BID  . isosorbide-hydrALAZINE  1 tablet Oral TID  . montelukast  10 mg Oral QHS  . simvastatin  40 mg Oral Daily  . sodium chloride flush  3 mL Intravenous Q12H  . sodium chloride flush  3 mL Intravenous Q12H  . spironolactone  12.5 mg Oral Daily    Infusions: . sodium chloride    . cefTRIAXone (ROCEPHIN)  IV 1 g (02/09/19 2116)    PRN Medications: sodium chloride, acetaminophen **OR** acetaminophen, albuterol, ondansetron (ZOFRAN) IV, sodium chloride flush, traZODone   Assessment/Plan:   1. 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 Now presenting with SOB and LE edema. BNP 2222. CT Chest with pleural effusions. Echo this admission revealed EF 15-20%, G3DD, and diffuse hypokinesis. He has been diuresing with IV lasix 40mg  BID with net -737mL in the past 24 hours and -1.2L this admission. Weight 167lbs>161lbs. Trops 0.03 x3 is not consistent with ACS and EKG with chronic LBBB. Possible there is an ischemic component as patient noted to have mild to moderate CAD in 2011. - He has Class IV HF with volume overload and worsening LV function with EF 15-20% also severe RV dysfunction  - Cath 5/15 with likely high-grade distal LM lesion.  - Case has been reviewed with Dr. Prescott Gum who has reviewed images. LAD only graftable vessel and given severe RV dysfunction, advanced age, fraility and CKD not felt to be CABG candidate. Case reviewed with Dr. Burt Knack with regard to possibility of LM stenting. It is highly calcified lesion and with severely reduced EF would need probable Impella support but PAD likely precludes. Additionally, LV was down prior to CAD so may not improve with stenting. I suspect medical management may be best.  - He looks near-euvolemic,  now on Lasix 60 mg daily (continue).  - LBBB may also be playing a role and need to consider CRT-D though he may be too far along - Continue Bidil.  - With NSVT, would increase Coreg to 6.25 mg bid. - Continue spironolactone.  - Was on losartan at home but will hold for now with marginal renal function. (GFR ~35). Can reconsider as outpatient. - hopefully will improve  with more targeted HF management.  - [facility] at d/c - Can go home today from my standpoint.   2. CAD: - No angina - Cath 5/15 with likely high-grade distal LM lesion. Medical therapy. See discussion above - On ASA. Resume plavix - Continue statin   3. RUL PNA: seen on CT Chest. Urine strep/legionella negative. COVID-19 negative. Sputum cx pending. On ceftriaxone/azithromcyin.  - Continue management per primary team  4. Acute on CKD3:  - labs 3 years ago with Cr 1.5. Suspect this is new baseline - Creatinine running 1.9-2.1, stable at 2 today.   5. History of CVA: He is on ASA 81, Plavix, and statin.   6. DM type 2: on ISS - Continue management per primary team - May be candidate for SGLT2i if CrCl > 30  From my standpoint, he could go home today. Will get followup in CHF clinic.  Cardiac meds for home: Coreg 6.25 mg bid, Bidil 1 tab tid, Lasix 60 mg daily, spironolactone 12.5 daily, ASA 81 daily, Plavix 75 daily, simvastatin 40 daily.   Length of Stay: 6   Loralie Champagne MD 02/10/2019, 11:03 AM  Advanced Heart Failure Team Pager [phone] (M-F; 7a - 4p)  Please contact [facility] for night-coverage after hours (4p -7a ) and weekends on [email or web address]

## 2019-02-10 NOTE — TOC Benefit Eligibility Note (Signed)
Transition of Care [facility]) Benefit Eligibility Note    Patient Details  Name: [name] MRN: [id number] Date of Birth: 1941-04-12   Medication/Dose: BIDIL  20 MG  THREE X A DAY     Tier: 3 Drug  Prescription Coverage Preferred Pharmacy: YES([facility], [facility], [facility])  Spoke with Person/Company/Phone Number:: JEANETTE([employer] # [phone])  Co-Pay: $ 289.50  Prior Approval: No  Deductible: Unmet       Memory Argue Phone Number: 02/10/2019, 12:46 PM

## 2019-02-10 NOTE — Discharge Instructions (Addendum)
[name],  You were in the hospital because of some trouble breathing and found to have pneumonia and worsened heart failure. Your symptoms have improved.  Pneumonia: you have actually completed your antibiotic course. Please follow-up with your primary care physician for follow-up chest x-ray  Heart failure: this has worsened. Your medications have been adjusted. Please stop taking your losartan. Please take your Coreg, Lasix, Spironolactone, Bidil as prescribed and follow-up with the cardiologist. Please keep daily fluid intake to less than 1.5L. Also, please weight yourself daily and inform your cardiologist of weight gains of 3+ pounds within a day  Chronic kidney disease: I recommend for you to see a nephrologist (kidney doctor). I have included information

## 2019-02-10 NOTE — Progress Notes (Signed)
Physical Therapy Treatment Patient Details Name: [name] MRN: [id number] DOB: [birth year] Today's Date: 02/10/2019    History of Present Illness [name] is a 78 y.o. male with history of chronic systolic heart failure last EF measured in 2017 was 35 to 40% with grade 1 diastolic dysfunction, history of stroke, diabetes mellitus type 2, chronic kidney disease stage III. Patient presented secondary to shortness of breath. Work up includes Acute on chronic combined systolic and diastolic heart failure.  CATH pending.    PT Comments    Patient is making good progress. He increased gait distance and decreased the amount of assist requied for transfers. Acute therapy will continue to work with the patient.    Follow Up Recommendations  No PT follow up;Supervision - Intermittent     Equipment Recommendations       Recommendations for Other Services       Precautions / Restrictions Precautions Precautions: None Restrictions Weight Bearing Restrictions: No    Mobility  Bed Mobility Overal bed mobility: Modified Independent             General bed mobility comments: Patient able to stand and walke without difficulty   Transfers Overall transfer level: Needs assistance Equipment used: Rolling walker (2 wheeled)   Sit to Stand: Modified independent (Device/Increase time)         General transfer comment: able to transfer without loss obf balance   Ambulation/Gait Ambulation/Gait assistance: Supervision Gait Distance (Feet): 230 Feet Assistive device: Rolling walker (2 wheeled) Gait Pattern/deviations: Step-through pattern Gait velocity: slower   General Gait Details: Normally uases a cane. Used no device for short periods because he wanted to test his balance. He had noLOB.    Stairs             Wheelchair Mobility    Modified Rankin (Stroke Patients Only)       Balance Overall balance assessment: Needs assistance   Sitting balance-Leahy  Scale: Good     Standing balance support: Bilateral upper extremity supported Standing balance-Leahy Scale: Poor                              Cognition Arousal/Alertness: Awake/alert Behavior During Therapy: WFL for tasks assessed/performed Overall Cognitive Status: Within Functional Limits for tasks assessed                                        Exercises      General Comments        Pertinent Vitals/Pain Pain Assessment: No/denies pain    Home Living                      Prior Function            PT Goals (current goals can now be found in the care plan section) Acute Rehab PT Goals Patient Stated Goal: back home independent PT Goal Formulation: With patient Time For Goal Achievement: 02/20/19 Potential to Achieve Goals: Good Progress towards PT goals: Progressing toward goals    Frequency    Min 3X/week      PT Plan Current plan remains appropriate    Co-evaluation              AM-PAC PT "6 Clicks" Mobility   Outcome Measure  Help needed turning from your back to your side while  in a flat bed without using bedrails?: None Help needed moving from lying on your back to sitting on the side of a flat bed without using bedrails?: None Help needed moving to and from a bed to a chair (including a wheelchair)?: None Help needed standing up from a chair using your arms (e.g., wheelchair or bedside chair)?: None Help needed to walk in hospital room?: None Help needed climbing 3-5 steps with a railing? : A Little 6 Click Score: 23    End of Session Equipment Utilized During Treatment: Gait belt Activity Tolerance: Patient tolerated treatment well Patient left: in chair;with call bell/phone within reach Nurse Communication: Mobility status PT Visit Diagnosis: Unsteadiness on feet (R26.81);Difficulty in walking, not elsewhere classified (R26.2)     Time: 1040-1056 PT Time Calculation (min) (ACUTE ONLY): 16  min  Charges:  $Gait Training: 8-22 mins                        Carney Living PT DPT  02/10/2019, 12:26 PM

## 2019-02-10 NOTE — Plan of Care (Signed)
  Problem: Health Behavior/Discharge Planning: Goal: Ability to manage health-related needs will improve Outcome: Progressing   Problem: Clinical Measurements: Goal: Will remain free from infection Outcome: Progressing Goal: Respiratory complications will improve Outcome: Progressing   Problem: Nutrition: Goal: Adequate nutrition will be maintained Outcome: Progressing   Problem: Elimination: Goal: Will not experience complications related to urinary retention Outcome: Progressing   Problem: Pain Managment: Goal: General experience of comfort will improve Outcome: Progressing   Problem: Safety: Goal: Ability to remain free from injury will improve Outcome: Progressing

## 2019-02-11 ENCOUNTER — Telehealth: Payer: Self-pay | Admitting: Physical Therapy

## 2019-02-11 MED FILL — FERREX 150 CAPSULE: 150 | 90 days supply | Qty: 90 | Fill #0

## 2019-02-11 NOTE — Telephone Encounter (Signed)
The patient has been informed of current processes in place at Outpatient Rehab to protect patients from Covid-19 exposure including social distancing, schedule modifications, and new cleaning procedures. After discussing their particular risk with a therapist based on the patient's personal risk factors, the patient has decided to proceed with in-person therapy

## 2019-02-13 ENCOUNTER — Encounter: Payer: Self-pay | Admitting: Physical Therapy

## 2019-02-13 ENCOUNTER — Ambulatory Visit: Payer: Medicare HMO | Attending: Family Medicine | Admitting: Physical Therapy

## 2019-02-13 ENCOUNTER — Other Ambulatory Visit: Payer: Self-pay

## 2019-02-13 VITALS — BP 92/45 | HR 75

## 2019-02-13 DIAGNOSIS — J189 Pneumonia, unspecified organism: Secondary | ICD-10-CM | POA: Diagnosis not present

## 2019-02-13 DIAGNOSIS — N183 Chronic kidney disease, stage 3 (moderate): Secondary | ICD-10-CM | POA: Diagnosis not present

## 2019-02-13 DIAGNOSIS — M6281 Muscle weakness (generalized): Secondary | ICD-10-CM | POA: Diagnosis not present

## 2019-02-13 DIAGNOSIS — R262 Difficulty in walking, not elsewhere classified: Secondary | ICD-10-CM | POA: Insufficient documentation

## 2019-02-13 DIAGNOSIS — I129 Hypertensive chronic kidney disease with stage 1 through stage 4 chronic kidney disease, or unspecified chronic kidney disease: Secondary | ICD-10-CM | POA: Diagnosis not present

## 2019-02-13 DIAGNOSIS — I25118 Atherosclerotic heart disease of native coronary artery with other forms of angina pectoris: Secondary | ICD-10-CM | POA: Diagnosis not present

## 2019-02-13 DIAGNOSIS — I69959 Hemiplegia and hemiparesis following unspecified cerebrovascular disease affecting unspecified side: Secondary | ICD-10-CM | POA: Diagnosis not present

## 2019-02-13 DIAGNOSIS — I5021 Acute systolic (congestive) heart failure: Secondary | ICD-10-CM | POA: Diagnosis not present

## 2019-02-13 DIAGNOSIS — E1129 Type 2 diabetes mellitus with other diabetic kidney complication: Secondary | ICD-10-CM | POA: Diagnosis not present

## 2019-02-13 DIAGNOSIS — D509 Iron deficiency anemia, unspecified: Secondary | ICD-10-CM | POA: Diagnosis not present

## 2019-02-13 NOTE — Patient Instructions (Signed)
Step 1  Step 2  Supine Lower Trunk Rotation reps: 10  sets: 1  hold: 10  daily: 2  weekly: 7 Setup  Begin lying on your back with your knees bent and feet resting on the floor. Movement  Keeping your back flat, slowly rotate your knees down towards the floor until you feel a stretch in your trunk and hold. Tip  Make sure that your back and shoulders stay in contact with the floor. Step 1  Step 2  Gastroc Stretch on Wall reps: 5  sets: 1  hold: 30  daily: 2  weekly: 7 Setup  Setup Directions Movement  Begin in a standing upright position in front of a wall.  Tip  Place your hands on the wall and extend one leg straight backward, bending your front leg, until you feel a stretch in the calf of your back leg and hold.  Step 1  Step 2  Step 3  Sit to Stand with Armchair reps: 10  sets: 1  hold: 5  daily: 2  weekly: 7 Setup  Begin sitting upright with your feet flat on the ground and your hands on the armrests of the chair. Movement  Lean your torso forward so your head is over your toes, then press into your feet and hands to stand up. Slowly sit back down using the armrests for support and repeat. Tip  Make sure to maintain your balance and try to keep your weight evenly distributed between both legs. Do not lock your knees when you are standing. Step 1  Step 2  Seated Hamstring Stretch reps: 5  sets: 1  hold: 30  daily: 2  weekly: 7 Setup  Begin sitting upright with your hands on your hips and one leg straight in front of you on the floor. Movement  Slowly bend your trunk forward until you feel a stretch in the back of your thigh and hold. Tip  Make sure to keep your back straight during the exercise.

## 2019-02-13 NOTE — Therapy (Signed)
[place], [place], [address] Phone: [phone]   Fax:  [phone]  Physical Therapy Evaluation  Patient Details  Name: [name] MRN: [id number] Date of Birth: [birth year] Referring Provider (PT): Dr. Cordelia Poche, Dr.  Prince Solian   Encounter Date: 02/13/2019  PT End of Session - 02/13/19 0943    Visit Number  1    Number of Visits  6    Date for PT Re-Evaluation  04/10/19    PT Start Time  0825    PT Stop Time  0915    PT Time Calculation (min)  50 min    Activity Tolerance  Patient tolerated treatment well    Behavior During Therapy  [facility] for tasks assessed/performed       Past Medical History:  Diagnosis Date  . Allergy   . Atrial fibrillation ([place])   . Cataract   . Chronic kidney disease    Renal Insuffiecny  . Colon polyps 2012   Colonoscopy  . Diabetes mellitus    type 2  . Diverticulosis 2012   Colonoscopy   . Hyperlipidemia   . Hypertension   . LBBB (left bundle branch block)   . Pneumonia 2015   3 times  . Stroke [facility]) 2015    Past Surgical History:  Procedure Laterality Date  . AMPUTATION Right 04/28/2016   Procedure: Right Transmetatarsal Amputation;  Surgeon: Newt Minion, MD;  Location: [facility];  Service: Orthopedics;  Laterality: Right;  . CATARACT EXTRACTION Bilateral    both eyes  . COLON SURGERY     to repair intestines as child  . COLONOSCOPY  10/20/2011   Procedure: COLONOSCOPY;  Surgeon: Inda Castle, MD;  Location: [facility];  Service: Endoscopy;  Laterality: N/A;  . FEMORAL-POPLITEAL BYPASS GRAFT Right 04/07/2016   Procedure: BYPASS GRAFT FEMORAL-POPLITEAL ARTERY-RIGHT;  Surgeon: Angelia Mould, MD;  Location: [facility];  Service: Vascular;  Laterality: Right;  . HOT HEMOSTASIS  10/20/2011   Procedure: HOT HEMOSTASIS (ARGON PLASMA COAGULATION/BICAP);  Surgeon: Inda Castle, MD;  Location: [facility];  Service: Endoscopy;  Laterality: N/A;  .  KNEE ARTHROSCOPY Left    left  . NECK SURGERY     disectomy  . PERIPHERAL VASCULAR CATHETERIZATION Right 04/05/2016   Procedure: Lower Extremity Angiography;  Surgeon: Rosetta Posner, MD;  Location: [facility];  Service: Cardiovascular;  Laterality: Right;  . PERIPHERAL VASCULAR CATHETERIZATION N/A 04/05/2016   Procedure: Abdominal Aortogram;  Surgeon: Rosetta Posner, MD;  Location: [facility];  Service: Cardiovascular;  Laterality: N/A;  . RIGHT/LEFT HEART CATH AND CORONARY ANGIOGRAPHY N/A 02/07/2019   Procedure: RIGHT/LEFT HEART CATH AND CORONARY ANGIOGRAPHY;  Surgeon: Jolaine Artist, MD;  Location: [facility];  Service: Cardiovascular;  Laterality: N/A;  . VEIN HARVEST Right 04/07/2016   Procedure: VEIN HARVEST GREATER SAPHENOUS VIEN ;  Surgeon: Angelia Mould, MD;  Location: [facility];  Service: Vascular;  Laterality: Right;    Vitals:   02/13/19 0846  BP: (!) 92/45  Pulse: 75     Subjective Assessment - 02/13/19 0829    Subjective  Patient was in the hospital for about a week with pneumonia, CHF. He was discharged 02/11/19.  He has weakness and balance issues, feels about 50% of normal.  He reports difficulty walking, standing, transfers.  He has frequent falls, about 2 times per month.   He has no pain overall.  He no longer gets  short of breath like when he did prior to hospitalization.      Patient is accompained by:  Family member    Pertinent History  Rt foot amputation. CVA, CHF, DM, pneumonia , Atrial fib    Limitations  Lifting;Standing;House hold activities;Walking    Diagnostic tests  chest XR    Patient Stated Goals  I want to get stronger.     Currently in Pain?  No/denies         [facility] PT Assessment - 02/13/19 0001      Assessment   Medical Diagnosis  Community acquired pneumonia     Referring Provider (PT)  Dr. Cordelia Poche, Dr.  Prince Solian    Onset Date/Surgical Date  02/05/19    Next MD Visit  today virtual visit with Dr. Dagmar Hait     Prior Therapy  Yes       Precautions   Precautions  Fall;Other (comment)    Precaution Comments  falls, cardiac, monitor vitals      Restrictions   Weight Bearing Restrictions  No      Balance Screen   Has the patient fallen in the past 6 months  Yes    How many times?  2 times per month    Has the patient had a decrease in activity level because of a fear of falling?   Yes    Is the patient reluctant to leave their home because of a fear of falling?   Yes      [place]  Private residence    Living Arrangements  Spouse/significant other    Type of [place] to enter    Entrance Stairs-Number of Steps  7    Entrance Stairs-Rails  Right;Left;Can reach both    [place]  Two level;Able to live on main level with bedroom/bathroom    [place] - single point      Prior Function   Level of Independence  Independent with basic ADLs;Independent with household mobility with device;Independent with community mobility with device    Vocation  Retired    Science writer    Leisure  naps, television       Cognition   Overall Cognitive Status  History of cognitive impairments - at baseline      Observation/Other Assessments   Focus on Therapeutic Outcomes (FOTO)   NT      Observation/Other Assessments-Edema    Edema  --   mild swelling in bilateral LEs     Sensation   Light Touch  Appears Intact      Posture/Postural Control   Posture/Postural Control  Postural limitations    Postural Limitations  Rounded Shoulders;Forward head;Increased thoracic kyphosis;Flexed trunk      Strength   Overall Strength Comments  Grossly 4/5 in hips, 4+/5 in knees and ankle DF       Flexibility   Hamstrings  tight, limits knee extension in sitting       Transfers   Comments  30 sec: able to do 10 reps "knees got weak"       Ambulation/Gait   Assistive device  None    Gait Pattern  Step-through  pattern;Decreased step length - right;Decreased step length - left;Decreased dorsiflexion - right;Decreased dorsiflexion - left;Right flexed knee in stance;Left flexed knee in stance;Decreased trunk rotation    Ambulation Surface  Level;Indoor      6 Minute  Walk- Baseline   6 Minute Walk- Baseline  yes    HR (bpm)  88    02 Sat (%RA)  98 %    Modified Borg Scale for Dyspnea  2- Mild shortness of breath      6 minute walk test results    Aerobic Endurance Distance Walked  687    Endurance additional comments  mild stagger, no device used , limited by R knee tightness       Timed Up and Go Test   Normal TUG (seconds)  13    TUG Comments  trial done at 14.5 sec ,, no device                 Objective measurements completed on examination: See above findings.      [facility] Adult PT Treatment/Exercise - 02/13/19 0001      Transfers   Five time sit to stand comments   19 sec used thighs to push up             PT Education - 02/13/19 0943    Education Details  PT/POC, HEP     Person(s) Educated  Patient;Spouse    Methods  Explanation;Demonstration;Verbal cues;Handout    Comprehension  Verbalized understanding;Returned demonstration;Verbal cues required;Need further instruction       PT Short Term Goals - 02/13/19 0949      PT SHORT TERM GOAL #1   Title  Pt will initiate HEP in order to indicate improved functional mobility and decreased fall risk.      Time  4    Period  Weeks    Status  New    Target Date  03/13/19        PT Long Term Goals - 02/13/19 0949      PT LONG TERM GOAL #1   Title  Pt will be independent with HEP in order to indicate decreased fall risk and improved functional mobility.    Time  8    Period  Weeks    Status  New    Target Date  04/10/19      PT LONG TERM GOAL #2   Title  Pt will be able to increase 6MW test to 800 feet to demo increased endurance     Time  8    Period  Weeks    Status  New    Target Date  04/10/19       PT LONG TERM GOAL #3   Title  Pt will sit to stand 15 times in 30 sec to demo functional strength/endurance    Time  8    Period  Weeks    Status  New    Target Date  04/10/19      PT LONG TERM GOAL #4   Title  Pt will ambulate >500' on varying outdoor surfaces (including grass) at mod I level in order to indicate safe community negotiation.      Time  8    Period  Weeks    Status  New    Target Date  04/10/19             Plan - 02/13/19 0945    Clinical Impression Statement  Patient presents with mild general deconditioning, balance issues.  He reports he is able to do all that he needs to do. He performed well on functional testing and endurance.  He would benefit from a few PT visits to ensure good technique and accountability.  He  is a mod COVID risk and understands risk at this time, prefers to do limited visits for this reason.     Personal Factors and Comorbidities  Comorbidity 1;Comorbidity 2;Comorbidity 3+;Age;Education;Profession    Comorbidities   CHF, chronic kidney disease , HTN    Examination-Activity Limitations  Locomotion Level;Stand    Examination-Participation Restrictions  Community Activity    Stability/Clinical Decision Making  Stable/Uncomplicated    Clinical Decision Making  Low    Rehab Potential  Excellent    PT Frequency  Biweekly    PT Duration  8 weeks    PT Treatment/Interventions  ADLs/Self Care Home Management;Functional mobility training;Therapeutic exercise;Patient/family education    PT Next Visit Plan  check HEP, NuStep, monitor vitals, functional strength     PT Home Exercise Plan  gastroc stretch, hamstring stretch, LTR, sit to stand    Consulted and Agree with Plan of Care  Patient       Patient will benefit from skilled therapeutic intervention in order to improve the following deficits and impairments:  Decreased balance, Decreased endurance, Postural dysfunction, Impaired flexibility, Decreased strength, Decreased activity tolerance,  Increased edema, Decreased range of motion, Cardiopulmonary status limiting activity  Visit Diagnosis: Difficulty in walking, not elsewhere classified  Muscle weakness (generalized)     Problem List Patient Active Problem List   Diagnosis Date Noted  . Community acquired pneumonia of left lower lobe of lung ([place]) 02/05/2019  . CKD (chronic kidney disease) stage 3, GFR 30-59 ml/min (HCC) 02/05/2019  . Acute CHF (congestive heart failure) ([place]) 02/05/2019  . Acute on chronic congestive heart failure ([place]) 02/04/2019  . Aftercare following surgery of the circulatory system 08/09/2016  . Status post transmetatarsal amputation of foot, right ([place]) 08/03/2016  . 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 Cellulitis and abscess of foot 03/23/2016  . Diabetic infection of right foot ([place]) 03/23/2016  . Puncture wound of foot, right, complicated 21/22/4825  . Malnutrition of moderate degree 03/23/2016  . AKI (acute kidney injury) ([place])   . Tobacco use disorder 02/28/2016  . Acute CVA (cerebrovascular accident) ([place]) 02/27/2016  . DM2 (diabetes mellitus, type 2) ([place]) 02/27/2016  . Anemia 02/27/2016  . Chronic systolic CHF (congestive heart failure) ([place]) 05/05/2015  . 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 Essential hypertension 07/16/2014  . Former smoker 04/21/2014  . 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 Dizziness 01/23/2014  . Diabetes ([place]) 01/23/2014  . Facial droop 01/23/2014  . Benign neoplasm of colon 08/25/2011  . CAD, NATIVE VESSEL 05/23/2010  . DIABETIC  RETINOPATHY 03/21/2010  . Hyperlipidemia 03/21/2010  . NEUROPATHY 03/21/2010  . HYPERTENSION 03/21/2010  . LBBB 03/21/2010  . 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 CERVICAL RADICULOPATHY 03/21/2010    PAA,JENNIFER 02/13/2019, 11:29 AM  [facility] [address] [place], [place], [address] Phone: [phone]   Fax:  [phone]  Name: Arvel Oquinn MRN: [id number] Date of Birth: 16-Feb-1941   Raeford Razor, PT 02/13/19 11:30 AM Phone: [phone] Fax: [phone]

## 2019-02-14 DIAGNOSIS — E1129 Type 2 diabetes mellitus with other diabetic kidney complication: Secondary | ICD-10-CM | POA: Diagnosis not present

## 2019-02-19 ENCOUNTER — Ambulatory Visit (HOSPITAL_COMMUNITY)
Admission: RE | Admit: 2019-02-19 | Discharge: 2019-02-19 | Disposition: A | Discharge status: Home / Self Care | Payer: Medicare HMO | Source: Ambulatory Visit | Attending: Cardiology | Admitting: Cardiology

## 2019-02-19 ENCOUNTER — Other Ambulatory Visit: Payer: Self-pay

## 2019-02-19 ENCOUNTER — Encounter (HOSPITAL_COMMUNITY): Payer: Self-pay

## 2019-02-19 VITALS — BP 108/60 | HR 88 | Wt 158.0 lb

## 2019-02-19 DIAGNOSIS — I2510 Atherosclerotic heart disease of native coronary artery without angina pectoris: Secondary | ICD-10-CM

## 2019-02-19 DIAGNOSIS — I447 Left bundle-branch block, unspecified: Secondary | ICD-10-CM | POA: Diagnosis not present

## 2019-02-19 DIAGNOSIS — N183 Chronic kidney disease, stage 3 unspecified: Secondary | ICD-10-CM

## 2019-02-19 DIAGNOSIS — I5022 Chronic systolic (congestive) heart failure: Secondary | ICD-10-CM | POA: Diagnosis not present

## 2019-02-19 LAB — CBC
HCT: 35.6 % — ABNORMAL LOW (ref 39.0–52.0)
Hemoglobin: 10.8 g/dL — ABNORMAL LOW (ref 13.0–17.0)
MCH: 29.3 pg (ref 26.0–34.0)
MCHC: 30.3 g/dL (ref 30.0–36.0)
MCV: 96.5 fL (ref 80.0–100.0)
Platelets: 209 10*3/uL (ref 150–400)
RBC: 3.69 MIL/uL — ABNORMAL LOW (ref 4.22–5.81)
RDW: 16.7 % — ABNORMAL HIGH (ref 11.5–15.5)
WBC: 3.7 10*3/uL — ABNORMAL LOW (ref 4.0–10.5)
nRBC: 0 % (ref 0.0–0.2)

## 2019-02-19 LAB — BASIC METABOLIC PANEL
Anion gap: 12 (ref 5–15)
BUN: 41 mg/dL — ABNORMAL HIGH (ref 8–23)
CO2: 23 mmol/L (ref 22–32)
Calcium: 8.9 mg/dL (ref 8.9–10.3)
Chloride: 105 mmol/L (ref 98–111)
Creatinine, Ser: 2.25 mg/dL — ABNORMAL HIGH (ref 0.61–1.24)
GFR calc Af Amer: 32 mL/min — ABNORMAL LOW (ref >60)
GFR calc non Af Amer: 27 mL/min — ABNORMAL LOW (ref >60)
Glucose, Bld: 198 mg/dL — ABNORMAL HIGH (ref 70–99)
Potassium: 4.8 mmol/L (ref 3.5–5.1)
Sodium: 140 mmol/L (ref 135–145)

## 2019-02-19 LAB — BRAIN NATRIURETIC PEPTIDE: B Natriuretic Peptide: 465.4 pg/mL — ABNORMAL HIGH (ref 0.0–100.0)

## 2019-02-19 MED ORDER — FUROSEMIDE 80 MG PO TABS: 80.0000 mg | ORAL_TABLET | Freq: Every day | ORAL | 1 refills | Status: DC

## 2019-02-19 MED FILL — FUROSEMIDE 80 MG TAB: 80 | 90 days supply | Qty: 90 | Fill #0

## 2019-02-19 NOTE — Patient Instructions (Signed)
Labs were done today. We will call you with any ABNORMAL results. No news is good news!  Today (Wendesday) and Thursday, please take 60 mg Lasix in the AM and 60 mg Lasix in the PM.  On Friday, begin taking 80 mg of Lasix daily.  Your physician recommends that you schedule a follow-up appointment in: 10 days.

## 2019-02-19 NOTE — Progress Notes (Signed)
PCP: Dr Dagmar Hait  Primary HF Cardiologist: Dr Haroldine Laws   HPI: [name] is a 77 y.o. male with a PMH of chronic combined CHF, non-ischemic cardiomyopathy, non-obstructive CAD on cath 2011, PAD s/p right fem-pop in 2017, DM type 2, HTN, HLD, stroke, CKD stage 3, tobacco abuse  Admitted with A/C biventricular HF and RUL PNA. Had RHC/LHC with volume overlaod and hig grad disttal LM lesion. CT surgery consulted. He was not thought to be surgery candidate due to given severe RV dysfunction, advanced age, fraility and CKD not felt to be CABG candidate. Case reviewed with Dr. Burt Knack with regard to possibility of LM stenting. It is highly calcified lesion and with severely reduced EF would need probable Impella support but PAD likely precludes. Additionally, LV was down prior to CAD so may not improve with stenting.Medical management was recommended.   Today he returns for post hospital follow up. Overall feeling fine. Mild SOB with exertion. Denies PND/Orthopnea. Denies chest pain. No bleeding issues. Appetite improving. No fever or chills. Weight at home 152-156  pounds. Having ongoing leg edema. Taking all medications.   RHC/LHC 02/07/2019   Mid RCA to Dist RCA lesion is 50% stenosed.  Prox RCA lesion is 40% stenosed.  Mid LM to Dist LM lesion is 80% stenosed.  RPDA lesion is 90% stenosed.  Ost Cx to Prox Cx lesion is 80% stenosed.  Dist LM to Ost LAD lesion is 60% stenosed.  Prox Cx to Mid Cx lesion is 50% stenosed.  Prox LAD to Mid LAD lesion is 70% stenosed.  Findings: Ao = 136/76 (97) LV = 139/28 RA = 9 RV = 65/10 PA = 67/29 (48) PCW = 36 Fick cardiac output/index = 4.1/2.2 PVR = 3.0 WU SVR = 1721 Assessment: 1. Severe 2v CAD with probable high grade, calcified distal left main lesion 2. EF 20-25% due to iCM 3. Elevated filling pressures with moderately reduced CO  Echo  EF 40-45% in 2015  EF 25-30% in 2016 EF 35-40% in 2017 EF 15-20% in 2020  Severer RV dysfunction.    ROS: All systems negative except as listed in HPI, PMH and Problem List.  SH:  Social History   Socioeconomic History  . Marital status: Married    Spouse name: Not on file  . Number of children: 3  . Years of education: Not on file  . Highest education level: Not on file  Occupational History  . Occupation: Retired  Scientific laboratory technician  . Financial resource strain: Not on file  . Food insecurity:    Worry: Not on file    Inability: Not on file  . Transportation needs:    Medical: Not on file    Non-medical: Not on file  Tobacco Use  . Smoking status: Former Smoker    Packs/day: 0.25    Years: 20.00    Pack years: 5.00    Types: Cigarettes  . Smokeless tobacco: Never Used  . Tobacco comment: Quit June 2017  Substance and Sexual Activity  . Alcohol use: Yes    Alcohol/week: 0.0 standard drinks    Comment: rarely  . Drug use: No  . Sexual activity: Not on file  Lifestyle  . Physical activity:    Days per week: Not on file    Minutes per session: Not on file  . Stress: Not on file  Relationships  . Social connections:    Talks on phone: Not on file    Gets together: Not on file  Attends religious service: Not on file    Active member of club or organization: Not on file    Attends meetings of clubs or organizations: Not on file    Relationship status: Not on file  . Intimate partner violence:    Fear of current or ex partner: Not on file    Emotionally abused: Not on file    Physically abused: Not on file    Forced sexual activity: Not on file  Other Topics Concern  . Not on file  Social History Narrative  . Not on file    FH:  Family History  Problem Relation Age of Onset  . Diabetes Mellitus II Mother   . Heart disease Brother        before age 19  . Colon cancer Neg Hx   . Esophageal cancer Neg Hx   . Stomach cancer Neg Hx   . Anesthesia problems Neg Hx   . Hypotension Neg Hx   . Malignant hyperthermia Neg Hx   . Pseudochol deficiency Neg Hx      Past Medical History:  Diagnosis Date  . Allergy   . Atrial fibrillation ([place])   . Cataract   . Chronic kidney disease    Renal Insuffiecny  . Colon polyps 2012   Colonoscopy  . Diabetes mellitus    type 2  . Diverticulosis 2012   Colonoscopy   . Hyperlipidemia   . Hypertension   . LBBB (left bundle branch block)   . Pneumonia 2015   3 times  . Stroke [facility]) 2015    Current Outpatient Medications  Medication Sig Dispense Refill  . aspirin EC 81 MG EC tablet Take 1 tablet (81 mg total) by mouth daily.    . carvedilol (COREG) 6.25 MG tablet Take 1 tablet (6.25 mg total) by mouth 2 (two) times daily with a meal. 30 tablet 0  . clopidogrel (PLAVIX) 75 MG tablet Take 75 mg by mouth daily.    . furosemide (LASIX) 20 MG tablet Take 3 tablets (60 mg total) by mouth daily. 30 tablet 0  . glimepiride (AMARYL) 1 MG tablet Take 0.5 mg by mouth 2 (two) times daily.     . isosorbide-hydrALAZINE (BIDIL) 20-37.5 MG tablet Take 1 tablet by mouth 3 (three) times daily. 90 tablet 0  . metFORMIN (GLUCOPHAGE) 1000 MG tablet Take 1,000 mg by mouth 2 (two) times daily with a meal.    . Polysaccharide Iron Complex (FERREX 150 PO) Take by mouth daily.    . simvastatin (ZOCOR) 40 MG tablet Take 40 mg by mouth daily.     Marland Kitchen spironolactone (ALDACTONE) 25 MG tablet Take 0.5 tablets (12.5 mg total) by mouth daily. 15 tablet 0   No current facility-administered medications for this encounter.     Vitals:   02/19/19 0945  BP: 108/60  Pulse: 88  SpO2: 94%  Weight: 71.7 kg (158 lb)   Wt Readings from Last 3 Encounters:  02/19/19 71.7 kg (158 lb)  02/10/19 67.1 kg (147 lb 14.4 oz)  12/31/18 72.6 kg (160 lb)    PHYSICAL EXAM:  General:  Well appearing. No resp difficulty HEENT: normal Neck: supple. JVP ~10 . Carotids 2+ bilaterally; no bruits. No lymphadenopathy or thryomegaly appreciated. Cor: PMI normal. Regular rate & rhythm. No rubs, gallops or murmurs. Lungs: clear Abdomen: soft, nontender,  nondistended. No hepatosplenomegaly. No bruits or masses. Good bowel sounds. Extremities: no cyanosis, clubbing, rash, R and LLE 2+ edema Neuro: alert & orientedx3, cranial  nerves grossly intact. Moves all 4 extremities w/o difficulty. Affect pleasant.      ASSESSMENT & PLAN: 1. Chronic biventricular HUD:[id number] with a history of non-ischemic cardiomyopathy with EF 25-30% in 2016, improved to 35-40% on echo in 2017.  Echo this 01/2019 revealed EF 15-20%, G3DD, and diffuse hypokinesis.  - Cath 5/15 with likely high-grade distal LM lesion.  - Case has been reviewed with Dr. Prescott Gum who has reviewed images. LAD only graftable vessel and given severe RV dysfunction, advanced age, fraility and CKD not felt to be CABG candidate. Case reviewed with Dr. Burt Knack with regard to possibility of LM stenting. It is highly calcified lesion and with severely reduced EF would need probable Impella support but PAD likely precludes. Additionally, LV was down prior to CAD so may not improve with stenting. I suspect medical management may be best.  - - LBBB may also be playing a role and need to consider CRT-D though he may be too far along.  - Plan to repeat ECHO in 3 months after HF meds optimized.  - NYHA IIIb. Volume status elevated.Weight at home trending up since discharge.  Increase lasix to 60 mg twice a day x 2 days then he will start lasix 80 mg daily. Discussed low salt food choices and to limit fluid intake to < 2 liters per day.  Instructed to wear compression stockings.  - Continue Bidil 1 tab tid  - Coreg to 6.25 mg bid. - Continue spironolactone.  - Was on losartan at home but will hold for now with marginal renal function.  2. CAD: - Cath 5/15 with likely high-grade distal LM lesion. Medical therapy. See discussion above - On ASA. Resume plavix - Continue statin  - No chest pain.   3. RUL PNA: Most recent hospitalization. Completed antibiotic course.   4.CKD3: - labs 3 years ago with  Cr 1.5. Suspect this is new baseline - Creatinine running 1.9-2.1, check BMET today.  - He has follow up with Nephrology - Instructed to avoid NSAIDs.    5. History of CVA:He is on ASA 81, Plavix, and statin.   6. DM type 2:on ISS - Continue management per primary team - May be candidate for SGLT2i if CrCl > 30   Follow up in 10 days to reassess volume status. Plan to repeat BMET at that time. Greater than 50% of the (total minutes 25) visit spent in counseling/coordination of care regarding the above.    Adrean Findlay  NP-C  11:05 AM

## 2019-02-19 NOTE — Progress Notes (Signed)
ReDS Vest - 02/19/19 1000      ReDS Vest   MR   No  (Pended)     Estimated volume prior to reading  Med  (Pended)     Fitting Posture  Sitting  (Pended)     Height Marker  Tall  (Pended)     Ruler Value  33  (Pended)     Center Strip  Aligned  (Pended)     ReDS Value  31  (Pended)

## 2019-02-25 ENCOUNTER — Encounter: Payer: Self-pay | Admitting: Physical Therapy

## 2019-02-25 ENCOUNTER — Other Ambulatory Visit: Payer: Self-pay

## 2019-02-25 ENCOUNTER — Ambulatory Visit: Payer: Medicare HMO | Attending: Family Medicine | Admitting: Physical Therapy

## 2019-02-25 DIAGNOSIS — R262 Difficulty in walking, not elsewhere classified: Secondary | ICD-10-CM

## 2019-02-25 DIAGNOSIS — M6281 Muscle weakness (generalized): Secondary | ICD-10-CM | POA: Insufficient documentation

## 2019-02-25 NOTE — Therapy (Addendum)
[place], [place], [address] Phone: [phone]   Fax:  [phone]  Physical Therapy Treatment/Discharge  Patient Details  Name: [name] MRN: [id number] Date of Birth: September 05, 1941 Referring Provider (PT): Dr. Cordelia Poche, Dr.  Prince Solian   Encounter Date: 02/25/2019  PT End of Session - 02/25/19 0813    Visit Number  2    Number of Visits  6    Date for PT Re-Evaluation  04/10/19    PT Start Time  0808    PT Stop Time  0846    PT Time Calculation (min)  38 min    Activity Tolerance  Patient tolerated treatment well;Patient limited by fatigue    Behavior During Therapy  [facility] for tasks assessed/performed       Past Medical History:  Diagnosis Date  . Allergy   . Atrial fibrillation ([place])   . Cataract   . Chronic kidney disease    Renal Insuffiecny  . Colon polyps 2012   Colonoscopy  . Diabetes mellitus    type 2  . Diverticulosis 2012   Colonoscopy   . Hyperlipidemia   . Hypertension   . LBBB (left bundle branch block)   . Pneumonia 2015   3 times  . Stroke [facility]) 2015    Past Surgical History:  Procedure Laterality Date  . AMPUTATION Right 04/28/2016   Procedure: Right Transmetatarsal Amputation;  Surgeon: Newt Minion, MD;  Location: [place];  Service: Orthopedics;  Laterality: Right;  . CATARACT EXTRACTION Bilateral    both eyes  . COLON SURGERY     to repair intestines as child  . COLONOSCOPY  10/20/2011   Procedure: COLONOSCOPY;  Surgeon: Inda Castle, MD;  Location: [facility];  Service: Endoscopy;  Laterality: N/A;  . FEMORAL-POPLITEAL BYPASS GRAFT Right 04/07/2016   Procedure: BYPASS GRAFT FEMORAL-POPLITEAL ARTERY-RIGHT;  Surgeon: Angelia Mould, MD;  Location: [place];  Service: Vascular;  Laterality: Right;  . HOT HEMOSTASIS  10/20/2011   Procedure: HOT HEMOSTASIS (ARGON PLASMA COAGULATION/BICAP);  Surgeon: Inda Castle, MD;  Location: [facility];  Service:  Endoscopy;  Laterality: N/A;  . KNEE ARTHROSCOPY Left    left  . NECK SURGERY     disectomy  . PERIPHERAL VASCULAR CATHETERIZATION Right 04/05/2016   Procedure: Lower Extremity Angiography;  Surgeon: Rosetta Posner, MD;  Location: [place] [facility];  Service: Cardiovascular;  Laterality: Right;  . PERIPHERAL VASCULAR CATHETERIZATION N/A 04/05/2016   Procedure: Abdominal Aortogram;  Surgeon: Rosetta Posner, MD;  Location: [place] [facility];  Service: Cardiovascular;  Laterality: N/A;  . RIGHT/LEFT HEART CATH AND CORONARY ANGIOGRAPHY N/A 02/07/2019   Procedure: RIGHT/LEFT HEART CATH AND CORONARY ANGIOGRAPHY;  Surgeon: Jolaine Artist, MD;  Location: [place] [facility];  Service: Cardiovascular;  Laterality: N/A;  . VEIN HARVEST Right 04/07/2016   Procedure: VEIN HARVEST GREATER SAPHENOUS VIEN ;  Surgeon: Angelia Mould, MD;  Location: [place] [facility];  Service: Vascular;  Laterality: Right;    There were no vitals filed for this visit.  Subjective Assessment - 02/25/19 0812    Subjective  Ankle is a little stiff.  Did stretches about 2-3 times.      Patient is accompained by:  Family member    Pertinent History  Rt foot amputation. CVA, CHF, DM, pneumonia , Atrial fib    Limitations  Lifting;Standing;House hold activities;Walking    Diagnostic tests  chest XR    Patient Stated Goals  I want to get stronger.     Currently in Pain?  No/denies          [facility] Adult PT Treatment/Exercise - 02/25/19 0001      Knee/Hip Exercises: Stretches   Active Hamstring Stretch  Both;2 reps;30 seconds    Gastroc Stretch  Both;3 reps    Gastroc Stretch Limitations  wall stretch (HEP)       Knee/Hip Exercises: Aerobic   Nustep  L6 changes to level 2 for 5 min , tired       Knee/Hip Exercises: Seated   Long Arc Quad  Strengthening;Both;1 set;20 reps;Weights    Long Arc Quad Weight  3 lbs.    Marching  Strengthening;Both;1 set;20 reps;Weights    Marching Weights  3 lbs.    Sit to Sand  1 set;10  reps;with UE support   min UE support on thighs , Rt knee feels "weak"     Knee/Hip Exercises: Supine   Bridges  Strengthening;Both;2 sets;10 reps    Straight Leg Raises  Strengthening;Both;1 set;10 reps    Other Supine Knee/Hip Exercises  clam with green band x 20       Knee/Hip Exercises: Sidelying   Hip ABduction  Strengthening;Both;2 sets;10 reps    Clams  x 20 cues to maintain neutral                PT Short Term Goals - 02/25/19 0853      PT SHORT TERM GOAL #1   Title  Pt will initiate HEP in order to indicate improved functional mobility and decreased fall risk.      Baseline  given today, needs cues     Status  Partially Met        PT Long Term Goals - 02/25/19 0854      PT LONG TERM GOAL #1   Title  Pt will be independent with HEP in order to indicate decreased fall risk and improved functional mobility.    Status  On-going      PT LONG TERM GOAL #2   Title  Pt will be able to increase 6MW test to 800 feet to demo increased endurance     Status  Unable to assess      PT LONG TERM GOAL #3   Title  Pt will sit to stand 15 times in 30 sec to demo functional strength/endurance    Status  On-going      PT LONG TERM GOAL #4   Title  Pt will ambulate >500' on varying outdoor surfaces (including grass) at mod I level in order to indicate safe community negotiation.      Status  On-going            Plan - 02/25/19 0820    Clinical Impression Statement  Patient did well today, other than mild fatigue.  End of session he mentioned he might not want ot come back to PT.  I asked him to call within 2-3 weeks to let us know what he wants to do.  Added to HEP.  No signs of dyspnea during session.     Personal Factors and Comorbidities  Comorbidity 1;Comorbidity 2;Comorbidity 3+;Age;Education;Profession    Comorbidities   CHF, chronic kidney disease , HTN    Examination-Activity Limitations  Locomotion Level;Stand    Examination-Participation Restrictions   Community Activity    PT Treatment/Interventions  ADLs/Self Care Home Management;Functional mobility training;Therapeutic exercise;Patient/family education    PT Next Visit Plan  if he comes  back, cont strengthening and endurance.     PT Home Exercise Plan  gastroc stretch, hamstring stretch, LTR, sit to stand    Consulted and Agree with Plan of Care  Patient       Patient will benefit from skilled therapeutic intervention in order to improve the following deficits and impairments:  Decreased balance, Decreased endurance, Postural dysfunction, Impaired flexibility, Decreased strength, Decreased activity tolerance, Increased edema, Decreased range of motion, Cardiopulmonary status limiting activity  Visit Diagnosis: Difficulty in walking, not elsewhere classified  Muscle weakness (generalized)     Problem List Patient Active Problem List   Diagnosis Date Noted  . Community acquired pneumonia of left lower lobe of lung ([place]) 02/05/2019  . CKD (chronic kidney disease) stage 3, GFR 30-59 ml/min (H[facility]) 02/05/2019  . Acute CHF (congestive heart failure) ([place]) 02/05/2019  . Acute on chronic congestive heart failure ([place]) 02/04/2019  . Aftercare following surgery of the circulatory system 08/09/2016  . Status post transmetatarsal amputation of foot, right ([place]) 08/03/2016  . 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 Cellulitis and abscess of foot 03/23/2016  . Diabetic infection of right foot ([place]) 03/23/2016  . Puncture wound of foot, right, complicated 83/15/1761  . Malnutrition of moderate degree 03/23/2016  . AKI (acute kidney injury) ([place])   . Tobacco use disorder 02/28/2016  . Acute CVA (cerebrovascular accident) ([place]) 02/27/2016  . DM2 (diabetes mellitus, type 2) ([place]) 02/27/2016  . Anemia 02/27/2016  . Chronic systolic CHF (congestive heart failure) ([place]) 05/05/2015  . 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 Essential hypertension 07/16/2014  . Former smoker 04/21/2014  . 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 Dizziness 01/23/2014  . Diabetes ([place]) 01/23/2014  . Facial droop 01/23/2014  . Benign neoplasm of colon 08/25/2011  . CAD, NATIVE VESSEL 05/23/2010  . DIABETIC  RETINOPATHY 03/21/2010  . Hyperlipidemia 03/21/2010  . NEUROPATHY 03/21/2010  . HYPERTENSION 03/21/2010  . LBBB (left bundle branch block) 03/21/2010  . 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 CERVICAL RADICULOPATHY 03/21/2010    , 02/25/2019, 9:01 AM  [facility] [address] [place], [place], [address] Phone: [phone]   Fax:  [phone]  Name: Willim Turnage MRN: [id number] Date of Birth: [birth year]  Raeford Razor, PT 02/25/19 9:01 AM Phone: [phone] Fax: [phone]   PHYSICAL THERAPY DISCHARGE SUMMARY  Visits from Start of Care: 2  Current functional level related to goals / functional outcomes: Unknown specifics, feels like he has improved overall   Remaining deficits: Unknown but likely endurance   Education / Equipment: HEP and posture , PT benefits  Plan: Patient agrees to discharge.  Patient goals were not met. Patient is being discharged due to the patient's request.  ?????    Raeford Razor, PT 03/05/19 9:04 AM Phone: [phone] Fax: [phone]

## 2019-03-03 ENCOUNTER — Other Ambulatory Visit: Payer: Self-pay

## 2019-03-03 ENCOUNTER — Ambulatory Visit (HOSPITAL_COMMUNITY)
Admission: RE | Admit: 2019-03-03 | Discharge: 2019-03-03 | Disposition: A | Discharge status: Home / Self Care | Payer: Medicare HMO | Source: Ambulatory Visit | Attending: Cardiology | Admitting: Cardiology

## 2019-03-03 ENCOUNTER — Encounter (HOSPITAL_COMMUNITY): Payer: Self-pay

## 2019-03-03 VITALS — BP 128/62 | HR 95 | Wt 158.2 lb

## 2019-03-03 DIAGNOSIS — I2510 Atherosclerotic heart disease of native coronary artery without angina pectoris: Secondary | ICD-10-CM

## 2019-03-03 DIAGNOSIS — Z87891 Personal history of nicotine dependence: Secondary | ICD-10-CM | POA: Diagnosis not present

## 2019-03-03 DIAGNOSIS — I5082 Biventricular heart failure: Secondary | ICD-10-CM | POA: Insufficient documentation

## 2019-03-03 DIAGNOSIS — E785 Hyperlipidemia, unspecified: Secondary | ICD-10-CM | POA: Diagnosis not present

## 2019-03-03 DIAGNOSIS — E1122 Type 2 diabetes mellitus with diabetic chronic kidney disease: Secondary | ICD-10-CM | POA: Diagnosis not present

## 2019-03-03 DIAGNOSIS — Z7902 Long term (current) use of antithrombotics/antiplatelets: Secondary | ICD-10-CM | POA: Insufficient documentation

## 2019-03-03 DIAGNOSIS — I428 Other cardiomyopathies: Secondary | ICD-10-CM | POA: Insufficient documentation

## 2019-03-03 DIAGNOSIS — Z7982 Long term (current) use of aspirin: Secondary | ICD-10-CM | POA: Diagnosis not present

## 2019-03-03 DIAGNOSIS — Z7984 Long term (current) use of oral hypoglycemic drugs: Secondary | ICD-10-CM | POA: Diagnosis not present

## 2019-03-03 DIAGNOSIS — Z833 Family history of diabetes mellitus: Secondary | ICD-10-CM | POA: Insufficient documentation

## 2019-03-03 DIAGNOSIS — Z8249 Family history of ischemic heart disease and other diseases of the circulatory system: Secondary | ICD-10-CM | POA: Insufficient documentation

## 2019-03-03 DIAGNOSIS — I5042 Chronic combined systolic (congestive) and diastolic (congestive) heart failure: Secondary | ICD-10-CM | POA: Diagnosis present

## 2019-03-03 DIAGNOSIS — Z8673 Personal history of transient ischemic attack (TIA), and cerebral infarction without residual deficits: Secondary | ICD-10-CM | POA: Insufficient documentation

## 2019-03-03 DIAGNOSIS — Z79899 Other long term (current) drug therapy: Secondary | ICD-10-CM | POA: Diagnosis not present

## 2019-03-03 DIAGNOSIS — I509 Heart failure, unspecified: Secondary | ICD-10-CM | POA: Diagnosis not present

## 2019-03-03 DIAGNOSIS — I5022 Chronic systolic (congestive) heart failure: Secondary | ICD-10-CM

## 2019-03-03 DIAGNOSIS — N183 Chronic kidney disease, stage 3 unspecified: Secondary | ICD-10-CM

## 2019-03-03 DIAGNOSIS — I447 Left bundle-branch block, unspecified: Secondary | ICD-10-CM

## 2019-03-03 DIAGNOSIS — I130 Hypertensive heart and chronic kidney disease with heart failure and stage 1 through stage 4 chronic kidney disease, or unspecified chronic kidney disease: Secondary | ICD-10-CM | POA: Insufficient documentation

## 2019-03-03 MED ORDER — CARVEDILOL 6.25 MG PO TABS: 6.2500 mg | ORAL_TABLET | Freq: Two times a day (BID) | ORAL | 11 refills | Status: DC

## 2019-03-03 MED FILL — CARVEDILOL 6.25 MG TABLET: 6.25 | 30 days supply | Qty: 60 | Fill #0

## 2019-03-03 NOTE — Progress Notes (Signed)
PCP: Dr Dagmar Hait  Primary HF Cardiologist: Dr Haroldine Laws   HPI: [name] is a 78 y.o. male with a PMH of chronic combined CHF, non-ischemic cardiomyopathy, non-obstructive CAD on cath 2011, PAD s/p right fem-pop in 2017, DM type 2, HTN, HLD, stroke, CKD stage 3, tobacco abuse  Admitted with A/C biventricular HF and RUL PNA. Had RHC/LHC with volume overlaod and hig grad disttal LM lesion. CT surgery consulted. He was not thought to be surgery candidate due to given severe RV dysfunction, advanced age, fraility and CKD not felt to be CABG candidate. Case reviewed with Dr. Burt Knack with regard to possibility of LM stenting. It is highly calcified lesion and with severely reduced EF would need probable Impella support but PAD likely precludes. Additionally, LV was down prior to CAD so may not improve with stenting.Medical management was recommended.   Today he returns for HF follow up. Last visit lasix was increased due to volume overload. Overall feeling fair. Remains SOB with exertion. Denies PND/Orthopnea. No chest pain. Appetite improving. No fever or chills. He has not been weighing at home. Taking all medications but his wife says they stopped carvedilol last week.   RHC/LHC 02/07/2019   Mid RCA to Dist RCA lesion is 50% stenosed.  Prox RCA lesion is 40% stenosed.  Mid LM to Dist LM lesion is 80% stenosed.  RPDA lesion is 90% stenosed.  Ost Cx to Prox Cx lesion is 80% stenosed.  Dist LM to Ost LAD lesion is 60% stenosed.  Prox Cx to Mid Cx lesion is 50% stenosed.  Prox LAD to Mid LAD lesion is 70% stenosed.  Findings: Ao = 136/76 (97) LV = 139/28 RA = 9 RV = 65/10 PA = 67/29 (48) PCW = 36 Fick cardiac output/index = 4.1/2.2 PVR = 3.0 WU SVR = 1721 Assessment: 1. Severe 2v CAD with probable high grade, calcified distal left main lesion 2. EF 20-25% due to iCM 3. Elevated filling pressures with moderately reduced CO  Echo  EF 40-45% in 2015  EF 25-30% in 2016 EF 35-40% in  2017 EF 15-20% in 2020  Severer RV dysfunction.   ROS: All systems negative except as listed in HPI, PMH and Problem List.  SH:  Social History   Socioeconomic History  . Marital status: Married    Spouse name: Not on file  . Number of children: 3  . Years of education: Not on file  . Highest education level: Not on file  Occupational History  . Occupation: Retired  Scientific laboratory technician  . Financial resource strain: Not on file  . Food insecurity:    Worry: Not on file    Inability: Not on file  . Transportation needs:    Medical: Not on file    Non-medical: Not on file  Tobacco Use  . Smoking status: Former Smoker    Packs/day: 0.25    Years: 20.00    Pack years: 5.00    Types: Cigarettes  . Smokeless tobacco: Never Used  . Tobacco comment: Quit June 2017  Substance and Sexual Activity  . Alcohol use: Yes    Alcohol/week: 0.0 standard drinks    Comment: rarely  . Drug use: No  . Sexual activity: Not on file  Lifestyle  . Physical activity:    Days per week: Not on file    Minutes per session: Not on file  . Stress: Not on file  Relationships  . Social connections:    Talks on phone: Not on file  Gets together: Not on file    Attends religious service: Not on file    Active member of club or organization: Not on file    Attends meetings of clubs or organizations: Not on file    Relationship status: Not on file  . Intimate partner violence:    Fear of current or ex partner: Not on file    Emotionally abused: Not on file    Physically abused: Not on file    Forced sexual activity: Not on file  Other Topics Concern  . Not on file  Social History Narrative  . Not on file    FH:  Family History  Problem Relation Age of Onset  . Diabetes Mellitus II Mother   . Heart disease Brother        before age 25  . Colon cancer Neg Hx   . Esophageal cancer Neg Hx   . Stomach cancer Neg Hx   . Anesthesia problems Neg Hx   . Hypotension Neg Hx   . Malignant  hyperthermia Neg Hx   . Pseudochol deficiency Neg Hx     Past Medical History:  Diagnosis Date  . Allergy   . Atrial fibrillation ([place])   . Cataract   . Chronic kidney disease    Renal Insuffiecny  . Colon polyps 2012   Colonoscopy  . Diabetes mellitus    type 2  . Diverticulosis 2012   Colonoscopy   . Hyperlipidemia   . Hypertension   . LBBB (left bundle branch block)   . Pneumonia 2015   3 times  . Stroke [facility]) 2015    Current Outpatient Medications  Medication Sig Dispense Refill  . aspirin EC 81 MG EC tablet Take 1 tablet (81 mg total) by mouth daily.    . clopidogrel (PLAVIX) 75 MG tablet Take 75 mg by mouth daily.    . furosemide (LASIX) 80 MG tablet Take 1 tablet (80 mg total) by mouth daily. 90 tablet 1  . glimepiride (AMARYL) 1 MG tablet Take 0.5 mg by mouth 2 (two) times daily.     . isosorbide-hydrALAZINE (BIDIL) 20-37.5 MG tablet Take 1 tablet by mouth 3 (three) times daily. 90 tablet 0  . metFORMIN (GLUCOPHAGE) 1000 MG tablet Take 1,000 mg by mouth 2 (two) times daily with a meal.    . montelukast (SINGULAIR) 10 MG tablet Take 10 mg by mouth at bedtime.    . Polysaccharide Iron Complex (FERREX 150 PO) Take by mouth daily.    . simvastatin (ZOCOR) 40 MG tablet Take 40 mg by mouth daily.     Marland Kitchen spironolactone (ALDACTONE) 25 MG tablet Take 0.5 tablets (12.5 mg total) by mouth daily. 15 tablet 0   No current facility-administered medications for this encounter.    ReDS Vest - 03/03/19 1000      ReDS Vest   MR   No    Fitting Posture  Sitting    Height Marker  Tall   station C   Ruler Value  27    Center Strip  Aligned    ReDS Value  38        Vitals:   03/03/19 0948  BP: 128/62  Pulse: 95  SpO2: 92%  Weight: 71.8 kg (158 lb 3.2 oz)   Wt Readings from Last 3 Encounters:  03/03/19 71.8 kg (158 lb 3.2 oz)  02/19/19 71.7 kg (158 lb)  02/10/19 67.1 kg (147 lb 14.4 oz)    PHYSICAL EXAM: General:  Elderly.  No resp difficulty.  Wife present.   HEENT: normal Neck: supple. JVP 8-9. Carotids 2+ bilat; no bruits. No lymphadenopathy or thryomegaly appreciated. Cor: PMI nondisplaced. Regular rate & rhythm. No rubs, gallops or murmurs. Lungs: clear Abdomen: soft, nontender, nondistended. No hepatosplenomegaly. No bruits or masses. Good bowel sounds. Extremities: no cyanosis, clubbing, rash, R and LLE trace-1+ edema Neuro: alert & orientedx3, cranial nerves grossly intact. moves all 4 extremities w/o difficulty. Affect pleasant     ASSESSMENT & PLAN: 1. Chronic biventricular DBZ:MCEYEMV with a history of non-ischemic cardiomyopathy with EF 25-30% in 2016, improved to 35-40% on echo in 2017.  Echo this 01/2019 revealed EF 15-20%, G3DD, and diffuse hypokinesis.  - Cath 5/15 with likely high-grade distal LM lesion.  - Case has been reviewed with Dr. Prescott Gum who has reviewed images. LAD only graftable vessel and given severe RV dysfunction, advanced age, fraility and CKD not felt to be CABG candidate. Case reviewed with Dr. Burt Knack with regard to possibility of LM stenting. It is highly calcified lesion and with severely reduced EF would need probable Impella support but PAD likely precludes. Additionally, LV was down prior to CAD so may not improve with stenting. I suspect medical management may be best.  - - LBBB may also be playing a role and need to consider CRT-D though he may be too far along.  - Plan to repeat ECHO in 3 months after HF meds optimized.  - NYHA IIIb.Volume status stable. Continue lasix 80 mg daily..  - Restart carvedilol 6.25 mg twice a day. Not sure why this was stopped.   - Continue Bidil 1 tab tid  - Continue spironolactone.  - Was on losartan at home but will hold for now with marginal renal function.  - Discussed daily weights, low salt food choices, and limiting fluids to < 2 liters per day.  2. CAD: - Cath 5/15 with likely high-grade distal LM lesion. Medical therapy. See discussion above - No chest pain.  -  On ASA. Resume plavix - Continue statin   3. RUL PNA: Most recent hospitalization. Completed antibiotic course.   4.CKD3: - labs 3 years ago with Cr 1.5. Suspect this is new baseline - Creatinine running 1.9-2.1,  - He has follow up with Nephrology later this week.  - Instructed to avoid NSAIDs.    5. History of CVA:He is on ASA 81, Plavix, and statin.   6. DM type 2:on ISS - Continue management per primary team - May be candidate for SGLT2i if CrCl > 30    Follow up in 4 weeks.   Peter Raphael  NP-C  9:55 AM

## 2019-03-03 NOTE — Patient Instructions (Signed)
RESTART Coreg 6.25 mg, one tab twice daily  Your physician recommends that you schedule a follow-up appointment in: 4 weeks with Dr Aundra Dubin  Do the following things EVERYDAY: 1) Weigh yourself in the morning before breakfast. Write it down and keep it in a log. 2) Take your medicines as prescribed 3) Eat low salt foods-Limit salt (sodium) to 2000 mg per day.  4) Stay as active as you can everyday 5) Limit all fluids for the day to less than 2 liters

## 2019-03-03 NOTE — Progress Notes (Signed)
ReDS Vest - 03/03/19 1000      ReDS Vest   MR   No    Fitting Posture  Sitting    Height Marker  Tall   station C   Ruler Value  27    Center Strip  Aligned    ReDS Value  38

## 2019-03-05 ENCOUNTER — Encounter: Payer: Self-pay | Admitting: Physical Therapy

## 2019-03-05 ENCOUNTER — Telehealth: Payer: Self-pay | Admitting: Physical Therapy

## 2019-03-05 NOTE — Telephone Encounter (Signed)
Called patient about tomorrow's appt for PT.  Lat visit he mentioned he would like to discontinue PT as he felt he was improving.  He confirmed that today when I called him, he has been doing his HEP and does not feel he needs PT.  Will DC and send report to MD.  Raeford Razor, PT 03/05/19 9:01 AM Phone: [phone] Fax: [phone]

## 2019-03-06 ENCOUNTER — Ambulatory Visit: Payer: Medicare HMO | Admitting: Physical Therapy

## 2019-03-10 DIAGNOSIS — N189 Chronic kidney disease, unspecified: Secondary | ICD-10-CM | POA: Diagnosis not present

## 2019-03-10 DIAGNOSIS — E1122 Type 2 diabetes mellitus with diabetic chronic kidney disease: Secondary | ICD-10-CM | POA: Diagnosis not present

## 2019-03-10 DIAGNOSIS — N2581 Secondary hyperparathyroidism of renal origin: Secondary | ICD-10-CM | POA: Diagnosis not present

## 2019-03-10 DIAGNOSIS — E785 Hyperlipidemia, unspecified: Secondary | ICD-10-CM | POA: Diagnosis not present

## 2019-03-10 DIAGNOSIS — H3500 Unspecified background retinopathy: Secondary | ICD-10-CM | POA: Diagnosis not present

## 2019-03-10 DIAGNOSIS — J449 Chronic obstructive pulmonary disease, unspecified: Secondary | ICD-10-CM | POA: Diagnosis not present

## 2019-03-10 DIAGNOSIS — I2510 Atherosclerotic heart disease of native coronary artery without angina pectoris: Secondary | ICD-10-CM | POA: Diagnosis not present

## 2019-03-10 DIAGNOSIS — N183 Chronic kidney disease, stage 3 (moderate): Secondary | ICD-10-CM | POA: Diagnosis not present

## 2019-03-10 DIAGNOSIS — D631 Anemia in chronic kidney disease: Secondary | ICD-10-CM | POA: Diagnosis not present

## 2019-03-10 DIAGNOSIS — E875 Hyperkalemia: Secondary | ICD-10-CM | POA: Diagnosis not present

## 2019-03-10 DIAGNOSIS — I509 Heart failure, unspecified: Secondary | ICD-10-CM | POA: Diagnosis not present

## 2019-03-17 DIAGNOSIS — I5021 Acute systolic (congestive) heart failure: Secondary | ICD-10-CM | POA: Diagnosis not present

## 2019-03-17 DIAGNOSIS — R69 Illness, unspecified: Secondary | ICD-10-CM | POA: Diagnosis not present

## 2019-03-17 DIAGNOSIS — I130 Hypertensive heart and chronic kidney disease with heart failure and stage 1 through stage 4 chronic kidney disease, or unspecified chronic kidney disease: Secondary | ICD-10-CM | POA: Diagnosis not present

## 2019-03-17 DIAGNOSIS — N183 Chronic kidney disease, stage 3 (moderate): Secondary | ICD-10-CM | POA: Diagnosis not present

## 2019-03-17 DIAGNOSIS — I25118 Atherosclerotic heart disease of native coronary artery with other forms of angina pectoris: Secondary | ICD-10-CM | POA: Diagnosis not present

## 2019-03-17 DIAGNOSIS — E1129 Type 2 diabetes mellitus with other diabetic kidney complication: Secondary | ICD-10-CM | POA: Diagnosis not present

## 2019-03-17 DIAGNOSIS — J189 Pneumonia, unspecified organism: Secondary | ICD-10-CM | POA: Diagnosis not present

## 2019-03-17 DIAGNOSIS — D509 Iron deficiency anemia, unspecified: Secondary | ICD-10-CM | POA: Diagnosis not present

## 2019-03-17 MED FILL — ONETOUCH VERIO TEST STRIP: 33 days supply | Qty: 100 | Fill #0

## 2019-03-17 MED FILL — ONETOUCH DELICA PLUS LANCET: 33 days supply | Qty: 100 | Fill #0

## 2019-03-18 MED FILL — BIDIL TABLET: 20-37.5 | 90 days supply | Qty: 270 | Fill #0

## 2019-03-27 ENCOUNTER — Telehealth: Payer: Self-pay | Admitting: Cardiovascular Disease

## 2019-03-27 NOTE — Telephone Encounter (Signed)
New Message   Patients wife is returning call that she received in reference to patients 7/10 appt. Please call.

## 2019-03-27 NOTE — Telephone Encounter (Signed)
Spoke with wife, DPR on file.  She was agreeable to pt being seen in office on 7/10.  She then mentioned checking to see if she was off work.  She said pt is capable of getting up to office on his own.  Advised at this time we are not allowing visitors up with pts if they are able to get up on their own.  Wife states she usually comes with pt because she takes care of his meds.  Advised we could call her during the appt.  She said she is working and would not be able to speak during that time.  Advised I will send message to Dr. Elmarie Shiley nurse to see if they can work out an appt on a day she is off.  Wife appreciative for call.

## 2019-03-31 NOTE — Telephone Encounter (Signed)
Left message for patient's wife to call back regarding patient's appointment

## 2019-04-01 ENCOUNTER — Inpatient Hospital Stay (HOSPITAL_COMMUNITY): Payer: Medicare HMO

## 2019-04-01 ENCOUNTER — Inpatient Hospital Stay (HOSPITAL_COMMUNITY)
Admission: EM | Admit: 2019-04-01 | Discharge: 2019-04-04 | DRG: 291 | Disposition: A | Discharge status: Home Health | Payer: Medicare HMO | Attending: Internal Medicine | Admitting: Internal Medicine

## 2019-04-01 ENCOUNTER — Other Ambulatory Visit: Payer: Self-pay

## 2019-04-01 ENCOUNTER — Encounter (HOSPITAL_COMMUNITY): Payer: Self-pay | Admitting: Emergency Medicine

## 2019-04-01 ENCOUNTER — Emergency Department (HOSPITAL_COMMUNITY): Payer: Medicare HMO

## 2019-04-01 DIAGNOSIS — R0902 Hypoxemia: Secondary | ICD-10-CM

## 2019-04-01 DIAGNOSIS — E785 Hyperlipidemia, unspecified: Secondary | ICD-10-CM | POA: Diagnosis present

## 2019-04-01 DIAGNOSIS — B029 Zoster without complications: Secondary | ICD-10-CM | POA: Diagnosis not present

## 2019-04-01 DIAGNOSIS — E1151 Type 2 diabetes mellitus with diabetic peripheral angiopathy without gangrene: Secondary | ICD-10-CM | POA: Diagnosis present

## 2019-04-01 DIAGNOSIS — J181 Lobar pneumonia, unspecified organism: Secondary | ICD-10-CM | POA: Diagnosis not present

## 2019-04-01 DIAGNOSIS — I447 Left bundle-branch block, unspecified: Secondary | ICD-10-CM | POA: Diagnosis present

## 2019-04-01 DIAGNOSIS — Z7984 Long term (current) use of oral hypoglycemic drugs: Secondary | ICD-10-CM | POA: Diagnosis not present

## 2019-04-01 DIAGNOSIS — J9601 Acute respiratory failure with hypoxia: Secondary | ICD-10-CM | POA: Diagnosis present

## 2019-04-01 DIAGNOSIS — I130 Hypertensive heart and chronic kidney disease with heart failure and stage 1 through stage 4 chronic kidney disease, or unspecified chronic kidney disease: Secondary | ICD-10-CM | POA: Diagnosis not present

## 2019-04-01 DIAGNOSIS — J9801 Acute bronchospasm: Secondary | ICD-10-CM | POA: Diagnosis not present

## 2019-04-01 DIAGNOSIS — I5043 Acute on chronic combined systolic (congestive) and diastolic (congestive) heart failure: Secondary | ICD-10-CM | POA: Diagnosis not present

## 2019-04-01 DIAGNOSIS — J90 Pleural effusion, not elsewhere classified: Secondary | ICD-10-CM | POA: Diagnosis not present

## 2019-04-01 DIAGNOSIS — I4891 Unspecified atrial fibrillation: Secondary | ICD-10-CM | POA: Diagnosis present

## 2019-04-01 DIAGNOSIS — Y95 Nosocomial condition: Secondary | ICD-10-CM | POA: Diagnosis present

## 2019-04-01 DIAGNOSIS — Z8673 Personal history of transient ischemic attack (TIA), and cerebral infarction without residual deficits: Secondary | ICD-10-CM

## 2019-04-01 DIAGNOSIS — E11319 Type 2 diabetes mellitus with unspecified diabetic retinopathy without macular edema: Secondary | ICD-10-CM | POA: Diagnosis present

## 2019-04-01 DIAGNOSIS — I428 Other cardiomyopathies: Secondary | ICD-10-CM | POA: Diagnosis present

## 2019-04-01 DIAGNOSIS — S1096XA Insect bite of unspecified part of neck, initial encounter: Secondary | ICD-10-CM | POA: Diagnosis not present

## 2019-04-01 DIAGNOSIS — E1129 Type 2 diabetes mellitus with other diabetic kidney complication: Secondary | ICD-10-CM | POA: Diagnosis present

## 2019-04-01 DIAGNOSIS — I5022 Chronic systolic (congestive) heart failure: Secondary | ICD-10-CM

## 2019-04-01 DIAGNOSIS — N183 Chronic kidney disease, stage 3 unspecified: Secondary | ICD-10-CM | POA: Diagnosis present

## 2019-04-01 DIAGNOSIS — I509 Heart failure, unspecified: Secondary | ICD-10-CM

## 2019-04-01 DIAGNOSIS — R0602 Shortness of breath: Secondary | ICD-10-CM | POA: Diagnosis not present

## 2019-04-01 DIAGNOSIS — Z1159 Encounter for screening for other viral diseases: Secondary | ICD-10-CM | POA: Diagnosis not present

## 2019-04-01 DIAGNOSIS — E1122 Type 2 diabetes mellitus with diabetic chronic kidney disease: Secondary | ICD-10-CM | POA: Diagnosis not present

## 2019-04-01 DIAGNOSIS — N184 Chronic kidney disease, stage 4 (severe): Secondary | ICD-10-CM | POA: Diagnosis present

## 2019-04-01 DIAGNOSIS — Z87891 Personal history of nicotine dependence: Secondary | ICD-10-CM | POA: Diagnosis not present

## 2019-04-01 DIAGNOSIS — Z7982 Long term (current) use of aspirin: Secondary | ICD-10-CM | POA: Diagnosis not present

## 2019-04-01 DIAGNOSIS — Z66 Do not resuscitate: Secondary | ICD-10-CM | POA: Diagnosis present

## 2019-04-01 DIAGNOSIS — Z7189 Other specified counseling: Secondary | ICD-10-CM | POA: Diagnosis not present

## 2019-04-01 DIAGNOSIS — Z7902 Long term (current) use of antithrombotics/antiplatelets: Secondary | ICD-10-CM | POA: Diagnosis not present

## 2019-04-01 DIAGNOSIS — W57XXXA Bitten or stung by nonvenomous insect and other nonvenomous arthropods, initial encounter: Secondary | ICD-10-CM | POA: Diagnosis present

## 2019-04-01 DIAGNOSIS — Z8249 Family history of ischemic heart disease and other diseases of the circulatory system: Secondary | ICD-10-CM | POA: Diagnosis not present

## 2019-04-01 DIAGNOSIS — I10 Essential (primary) hypertension: Secondary | ICD-10-CM | POA: Diagnosis present

## 2019-04-01 DIAGNOSIS — Z833 Family history of diabetes mellitus: Secondary | ICD-10-CM | POA: Diagnosis not present

## 2019-04-01 DIAGNOSIS — I2510 Atherosclerotic heart disease of native coronary artery without angina pectoris: Secondary | ICD-10-CM | POA: Diagnosis present

## 2019-04-01 DIAGNOSIS — J189 Pneumonia, unspecified organism: Secondary | ICD-10-CM | POA: Diagnosis present

## 2019-04-01 DIAGNOSIS — Z515 Encounter for palliative care: Secondary | ICD-10-CM | POA: Diagnosis not present

## 2019-04-01 DIAGNOSIS — J9811 Atelectasis: Secondary | ICD-10-CM | POA: Diagnosis not present

## 2019-04-01 LAB — COMPREHENSIVE METABOLIC PANEL
ALT: 24 U/L (ref 0–44)
AST: 18 U/L (ref 15–41)
Albumin: 3.4 g/dL — ABNORMAL LOW (ref 3.5–5.0)
Alkaline Phosphatase: 65 U/L (ref 38–126)
Anion gap: 8 (ref 5–15)
BUN: 44 mg/dL — ABNORMAL HIGH (ref 8–23)
CO2: 19 mmol/L — ABNORMAL LOW (ref 22–32)
Calcium: 8.9 mg/dL (ref 8.9–10.3)
Chloride: 112 mmol/L — ABNORMAL HIGH (ref 98–111)
Creatinine, Ser: 2.06 mg/dL — ABNORMAL HIGH (ref 0.61–1.24)
GFR calc Af Amer: 35 mL/min — ABNORMAL LOW (ref >60)
GFR calc non Af Amer: 31 mL/min — ABNORMAL LOW (ref >60)
Glucose, Bld: 197 mg/dL — ABNORMAL HIGH (ref 70–99)
Potassium: 4.8 mmol/L (ref 3.5–5.1)
Sodium: 139 mmol/L (ref 135–145)
Total Bilirubin: 0.6 mg/dL (ref 0.3–1.2)
Total Protein: 6 g/dL — ABNORMAL LOW (ref 6.5–8.1)

## 2019-04-01 LAB — CBC WITH DIFFERENTIAL/PLATELET
Abs Immature Granulocytes: 0.01 10*3/uL (ref 0.00–0.07)
Basophils Absolute: 0 10*3/uL (ref 0.0–0.1)
Basophils Relative: 0 %
Eosinophils Absolute: 0.3 10*3/uL (ref 0.0–0.5)
Eosinophils Relative: 5 %
HCT: 36.1 % — ABNORMAL LOW (ref 39.0–52.0)
Hemoglobin: 11.4 g/dL — ABNORMAL LOW (ref 13.0–17.0)
Immature Granulocytes: 0 %
Lymphocytes Relative: 19 %
Lymphs Abs: 1.1 10*3/uL (ref 0.7–4.0)
MCH: 30.2 pg (ref 26.0–34.0)
MCHC: 31.6 g/dL (ref 30.0–36.0)
MCV: 95.5 fL (ref 80.0–100.0)
Monocytes Absolute: 0.5 10*3/uL (ref 0.1–1.0)
Monocytes Relative: 8 %
Neutro Abs: 4 10*3/uL (ref 1.7–7.7)
Neutrophils Relative %: 68 %
Platelets: 214 10*3/uL (ref 150–400)
RBC: 3.78 MIL/uL — ABNORMAL LOW (ref 4.22–5.81)
RDW: 16.4 % — ABNORMAL HIGH (ref 11.5–15.5)
WBC: 6 10*3/uL (ref 4.0–10.5)
nRBC: 0 % (ref 0.0–0.2)

## 2019-04-01 LAB — PROCALCITONIN: Procalcitonin: <0.1 ng/mL

## 2019-04-01 LAB — LACTIC ACID, PLASMA: Lactic Acid, Venous: 1.3 mmol/L (ref 0.5–1.9)

## 2019-04-01 LAB — BRAIN NATRIURETIC PEPTIDE: B Natriuretic Peptide: 1814.5 pg/mL — ABNORMAL HIGH (ref 0.0–100.0)

## 2019-04-01 LAB — SARS CORONAVIRUS 2 BY RT PCR (HOSPITAL ORDER, PERFORMED IN ~~LOC~~ HOSPITAL LAB): SARS Coronavirus 2: NEGATIVE

## 2019-04-01 MED ORDER — SODIUM CHLORIDE 0.9 % IV SOLN: 2.0000 g | Freq: Two times a day (BID) | INTRAVENOUS | Status: DC

## 2019-04-01 MED ORDER — VALACYCLOVIR HCL 500 MG PO TABS
1000.0000 mg | ORAL_TABLET | Freq: Once | ORAL | Status: AC
  Administered 2019-04-01: 1000 mg via ORAL
  Filled 2019-04-01: qty 2

## 2019-04-01 MED ORDER — FUROSEMIDE 10 MG/ML IJ SOLN
80.0000 mg | Freq: Once | INTRAMUSCULAR | Status: AC
  Administered 2019-04-01: 80 mg via INTRAVENOUS
  Filled 2019-04-01: qty 8

## 2019-04-01 MED ORDER — MAGNESIUM SULFATE 2 GM/50ML IV SOLN
2.0000 g | Freq: Once | INTRAVENOUS | Status: AC
  Administered 2019-04-01: 2 g via INTRAVENOUS
  Filled 2019-04-01: qty 50

## 2019-04-01 MED ORDER — VALACYCLOVIR HCL 500 MG PO TABS
1000.0000 mg | ORAL_TABLET | Freq: Every day | ORAL | Status: DC
  Administered 2019-04-02 – 2019-04-04 (×3): 1000 mg via ORAL
  Filled 2019-04-01 (×3): qty 2

## 2019-04-01 MED ORDER — VANCOMYCIN HCL 10 G IV SOLR
1500.0000 mg | Freq: Once | INTRAVENOUS | Status: AC
  Administered 2019-04-01: 1500 mg via INTRAVENOUS
  Filled 2019-04-01: qty 1500

## 2019-04-01 MED ORDER — HEPARIN SODIUM (PORCINE) 5000 UNIT/ML IJ SOLN
5000.0000 [IU] | Freq: Three times a day (TID) | INTRAMUSCULAR | Status: DC
  Administered 2019-04-01 – 2019-04-04 (×8): 5000 [IU] via SUBCUTANEOUS
  Filled 2019-04-01 (×8): qty 1

## 2019-04-01 MED ORDER — POTASSIUM CHLORIDE CRYS ER 10 MEQ PO TBCR
10.0000 meq | EXTENDED_RELEASE_TABLET | Freq: Two times a day (BID) | ORAL | Status: DC
  Administered 2019-04-01 – 2019-04-02 (×3): 10 meq via ORAL
  Filled 2019-04-01 (×3): qty 1

## 2019-04-01 MED ORDER — ACETAMINOPHEN 325 MG PO TABS
650.0000 mg | ORAL_TABLET | Freq: Once | ORAL | Status: AC
  Administered 2019-04-01: 650 mg via ORAL
  Filled 2019-04-01: qty 2

## 2019-04-01 MED ORDER — SODIUM CHLORIDE 0.9 % IV SOLN
2.0000 g | Freq: Once | INTRAVENOUS | Status: AC
  Administered 2019-04-01: 2 g via INTRAVENOUS
  Filled 2019-04-01: qty 2

## 2019-04-01 MED ORDER — OXYCODONE-ACETAMINOPHEN 5-325 MG PO TABS: 1.0000 | ORAL_TABLET | ORAL | Status: DC | PRN

## 2019-04-01 MED ORDER — POLYSACCHARIDE IRON COMPLEX 150 MG PO CAPS
150.0000 mg | ORAL_CAPSULE | Freq: Every day | ORAL | Status: DC
  Administered 2019-04-02 – 2019-04-04 (×3): 150 mg via ORAL
  Filled 2019-04-01 (×3): qty 1

## 2019-04-01 MED ORDER — SODIUM CHLORIDE 0.9 % IV SOLN
2.0000 g | INTRAVENOUS | Status: DC
  Administered 2019-04-02 – 2019-04-04 (×3): 2 g via INTRAVENOUS
  Filled 2019-04-01 (×3): qty 2
  Filled 2019-04-01: qty 20

## 2019-04-01 MED ORDER — GLIMEPIRIDE 1 MG PO TABS
0.5000 mg | ORAL_TABLET | Freq: Two times a day (BID) | ORAL | Status: DC
  Administered 2019-04-01 – 2019-04-04 (×6): 0.5 mg via ORAL
  Filled 2019-04-01 (×4): qty 1
  Filled 2019-04-01 (×2): qty 0.5
  Filled 2019-04-01: qty 1

## 2019-04-01 MED ORDER — ONDANSETRON HCL 4 MG/2ML IJ SOLN: 4.0000 mg | Freq: Four times a day (QID) | INTRAMUSCULAR | Status: DC | PRN

## 2019-04-01 MED ORDER — ZOLPIDEM TARTRATE 5 MG PO TABS: 5.0000 mg | ORAL_TABLET | Freq: Every evening | ORAL | Status: DC | PRN

## 2019-04-01 MED ORDER — SODIUM CHLORIDE 0.9 % IV SOLN: 250.0000 mL | INTRAVENOUS | Status: DC | PRN

## 2019-04-01 MED ORDER — ACETAMINOPHEN 325 MG PO TABS
650.0000 mg | ORAL_TABLET | ORAL | Status: DC | PRN
  Administered 2019-04-01: 650 mg via ORAL
  Filled 2019-04-01: qty 2

## 2019-04-01 MED ORDER — CLOPIDOGREL BISULFATE 75 MG PO TABS
75.0000 mg | ORAL_TABLET | Freq: Every day | ORAL | Status: DC
  Administered 2019-04-01 – 2019-04-04 (×4): 75 mg via ORAL
  Filled 2019-04-01 (×4): qty 1

## 2019-04-01 MED ORDER — SODIUM CHLORIDE 0.9% FLUSH
3.0000 mL | Freq: Two times a day (BID) | INTRAVENOUS | Status: DC
  Administered 2019-04-01 – 2019-04-04 (×6): 3 mL via INTRAVENOUS

## 2019-04-01 MED ORDER — SIMVASTATIN 20 MG PO TABS
40.0000 mg | ORAL_TABLET | Freq: Every day | ORAL | Status: DC
  Administered 2019-04-01 – 2019-04-04 (×4): 40 mg via ORAL
  Filled 2019-04-01 (×4): qty 2

## 2019-04-01 MED ORDER — VANCOMYCIN HCL 10 G IV SOLR: 1500.0000 mg | INTRAVENOUS | Status: DC

## 2019-04-01 MED ORDER — ASPIRIN EC 81 MG PO TBEC
81.0000 mg | DELAYED_RELEASE_TABLET | Freq: Every day | ORAL | Status: DC
  Administered 2019-04-01 – 2019-04-04 (×4): 81 mg via ORAL
  Filled 2019-04-01 (×4): qty 1

## 2019-04-01 MED ORDER — FUROSEMIDE 10 MG/ML IJ SOLN
80.0000 mg | Freq: Two times a day (BID) | INTRAMUSCULAR | Status: AC
  Administered 2019-04-01: 80 mg via INTRAVENOUS
  Filled 2019-04-01: qty 8

## 2019-04-01 MED ORDER — CARVEDILOL 6.25 MG PO TABS
6.2500 mg | ORAL_TABLET | Freq: Two times a day (BID) | ORAL | Status: DC
  Administered 2019-04-01 – 2019-04-04 (×7): 6.25 mg via ORAL
  Filled 2019-04-01 (×7): qty 1

## 2019-04-01 MED ORDER — DIPHENHYDRAMINE HCL 25 MG PO CAPS
25.0000 mg | ORAL_CAPSULE | Freq: Once | ORAL | Status: AC
  Administered 2019-04-01: 25 mg via ORAL
  Filled 2019-04-01: qty 1

## 2019-04-01 MED ORDER — OXYCODONE-ACETAMINOPHEN 5-325 MG PO TABS
1.0000 | ORAL_TABLET | ORAL | Status: AC | PRN
  Administered 2019-04-01 – 2019-04-03 (×3): 2 via ORAL
  Filled 2019-04-01 (×3): qty 2

## 2019-04-01 MED ORDER — AZITHROMYCIN 250 MG PO TABS
500.0000 mg | ORAL_TABLET | Freq: Every day | ORAL | Status: DC
  Administered 2019-04-02 – 2019-04-04 (×3): 500 mg via ORAL
  Filled 2019-04-01 (×3): qty 2

## 2019-04-01 MED ORDER — SODIUM CHLORIDE 0.9% FLUSH
3.0000 mL | INTRAVENOUS | Status: DC | PRN
  Administered 2019-04-01: 3 mL via INTRAVENOUS
  Filled 2019-04-01: qty 3

## 2019-04-01 MED ORDER — ALBUTEROL SULFATE HFA 108 (90 BASE) MCG/ACT IN AERS
2.0000 | INHALATION_SPRAY | Freq: Once | RESPIRATORY_TRACT | Status: AC
  Administered 2019-04-01: 2 via RESPIRATORY_TRACT
  Filled 2019-04-01: qty 6.7

## 2019-04-01 NOTE — Telephone Encounter (Signed)
Spoke with pt and wife [name]), they have answered no to all screening questions. Pt states that his wife must come with him to help during appt. Pt has also been informed of in office protocol.       COVID-19 Pre-Screening Questions:  . In the past 7 to 10 days have you had a cough,  shortness of breath, headache, congestion, fever (100 or greater) body aches, chills, sore throat, or sudden loss of taste or sense of smell? NO . Have you been around anyone with known Covid 19? NO . Have you been around anyone who is awaiting Covid 19 test results in the past 7 to 10 days? NO . Have you been around anyone who has been exposed to Covid 19, or has mentioned symptoms of Covid 19 within the past 7 to 10 days? NO  If you have any concerns/questions about symptoms patients report during screening (either on the phone or at threshold). Contact the provider seeing the patient or DOD for further guidance.  If neither are available contact a member of the leadership team.

## 2019-04-01 NOTE — ED Notes (Signed)
Pt has a tick on the right side of his neck, provider removed.  Pt has painful rash on right torso.

## 2019-04-01 NOTE — Progress Notes (Signed)
Patient complaining of 9/10 intermittent sharp/burning pain to below right armpit and right lateral hip area (one pustule noted in each area where pt is complaining of pain).  Patient received Tylenol earlier and told this RN it was not effective.  RN text paged [facility].

## 2019-04-01 NOTE — Progress Notes (Addendum)
Pharmacy Antibiotic Note  [name] is a 79 y.o. male admitted on 04/01/2019 with pneumonia.  Pharmacy has been consulted for Vancomycin and Cefepime dosing. Patient is afebrile and WBC is WNL. Chest CT shows airspace disease. Scr is stable at baseline ~2.06.   Plan: Vancomycin 1500mg  IV every 48 hours Cefepime 2g IV every 12 hours F/u renal fx and C&S, clincal status, and vanc peak/tr at [facility]  Height: 5\' 10"  (177.8 cm) Weight: 158 lb 3.2 oz (71.8 kg) IBW/kg (Calculated) : 73  Temp (24hrs), Avg:98.7 F (37.1 C), Min:98.5 F (36.9 C), Max:98.9 F (37.2 C)  Recent Labs  Lab 04/01/19 0854  WBC 6.0  CREATININE 2.06*  LATICACIDVEN 1.3    Estimated Creatinine Clearance: 31.5 mL/min (A) (by C-G formula based on SCr of 2.06 mg/dL (H)).    No Known Allergies  Antimicrobials this admission: 7/7 Vancomycin >>  7/7 Cefepime >>   Dose adjustments this admission: N/A  Microbiology results: pending  Thank you for allowing pharmacy to be a part of this patient's care.  Gwenyth Allegra [facility] 04/01/2019 11:19 AM

## 2019-04-01 NOTE — Consult Note (Signed)
[facility]  HISTORY AND PHYSICAL  [name] is an 78 y.o. male.    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 Evangeline Gula for evaluation and recommendations surrounding CKD.    Appears that pt's baseline creatinine is around 2 and he has been at this baseline in 2020.  He previously had a baseline creatinine of what appears to be 1.5-1.7 in 2017.  No labs in Epic from 2017--> 2020.    Presents today d/t a multitude of complaints.  Has a rash on his R neck and flank, and a tick was removed by the EDP from his R neck.  He also reports shortness of breath that has been present since his discharge from [facility] in June--> was noted to have obstructive CAD, not a candidate for CABG, medical management favored.    His rash was thought to be shingles and is now on valacyclovir 1000 mg daily.  He was also noted to have  L> R layering pleural effusions and likely airspace disease.  Started on azithro and ceftriaxone.  Got vanc/ cefepime in ED.    He is now seen for CKD.  He is at his baseline creatinine of 2.0.  He notes some increased LE edema.  Notes that the lesions on his R flank are very painful.     No f/c, n/v, CP.  Notes the SOB but denies PND.  Noted to be hypoxic in ED and placed on 2L.    PMH: Past Medical History:  Diagnosis Date  . Allergy   . Atrial fibrillation ([place])   . Cataract   . Chronic kidney disease    Renal Insuffiecny  . Colon polyps 2012   Colonoscopy  . Diabetes mellitus    type 2  . Diverticulosis 2012   Colonoscopy   . Hyperlipidemia   . Hypertension   . LBBB (left bundle branch block)   . Pneumonia 2015   3 times  . Stroke [facility]) 2015   PSH: Past Surgical History:  Procedure Laterality Date  . AMPUTATION Right 04/28/2016   Procedure: Right Transmetatarsal Amputation;  Surgeon: Newt Minion, MD;  Location: [place];  Service: Orthopedics;  Laterality: Right;  . CATARACT EXTRACTION Bilateral    both eyes  . COLON SURGERY     to repair intestines as child  . COLONOSCOPY  10/20/2011   Procedure: COLONOSCOPY;  Surgeon: Inda Castle, MD;  Location: [facility];  Service: Endoscopy;  Laterality: N/A;  . FEMORAL-POPLITEAL BYPASS GRAFT Right 04/07/2016   Procedure: BYPASS GRAFT FEMORAL-POPLITEAL ARTERY-RIGHT;  Surgeon: Angelia Mould, MD;  Location: [place];  Service: Vascular;  Laterality: Right;  . HOT HEMOSTASIS  10/20/2011   Procedure: HOT HEMOSTASIS (ARGON PLASMA COAGULATION/BICAP);  Surgeon: Inda Castle, MD;  Location: [facility];  Service: Endoscopy;  Laterality: N/A;  . KNEE ARTHROSCOPY Left    left  . NECK SURGERY     disectomy  . PERIPHERAL VASCULAR CATHETERIZATION Right 04/05/2016   Procedure: Lower Extremity Angiography;  Surgeon: Rosetta Posner, MD;  Location: [place] [facility];  Service: Cardiovascular;  Laterality: Right;  . PERIPHERAL VASCULAR CATHETERIZATION N/A 04/05/2016   Procedure: Abdominal Aortogram;  Surgeon: Rosetta Posner, MD;  Location: [place] [facility];  Service: Cardiovascular;  Laterality: N/A;  . RIGHT/LEFT HEART CATH AND CORONARY ANGIOGRAPHY N/A 02/07/2019   Procedure: RIGHT/LEFT HEART CATH AND CORONARY ANGIOGRAPHY;  Surgeon: Jolaine Artist, MD;  Location: [place] [facility];  Service: Cardiovascular;  Laterality: N/A;  . VEIN HARVEST Right 04/07/2016   Procedure: VEIN HARVEST GREATER SAPHENOUS VIEN ;  Surgeon: Angelia Mould, MD;  Location: [place];  Service: Vascular;  Laterality: Right;    Past Medical History:  Diagnosis Date  . Allergy   . Atrial fibrillation ([place])   . Cataract   . Chronic kidney disease    Renal Insuffiecny  . Colon polyps 2012   Colonoscopy  . Diabetes mellitus    type 2  . Diverticulosis 2012   Colonoscopy   . Hyperlipidemia   .  Hypertension   . LBBB (left bundle branch block)   . Pneumonia 2015   3 times  . Stroke [facility]) 2015    Medications:   Scheduled: . aspirin EC  81 mg Oral Daily  . [START ON 04/02/2019] azithromycin  500 mg Oral Daily  . carvedilol  6.25 mg Oral BID  . clopidogrel  75 mg Oral Daily  . furosemide  80 mg Intravenous BID  . glimepiride  0.5 mg Oral BID WC  . heparin  5,000 Units Subcutaneous Q8H  . [START ON 04/02/2019] iron polysaccharides  150 mg Oral Q breakfast  . potassium chloride  10 mEq Oral BID  . simvastatin  40 mg Oral Daily  . sodium chloride flush  3 mL Intravenous Q12H  . [START ON 04/02/2019] valACYclovir  1,000 mg Oral Daily    (Not in a hospital admission)   ALLERGIES:  No Known Allergies  FAM HX: Family History  Problem Relation Age of Onset  . Diabetes Mellitus II Mother   . Heart disease Brother        before age 39  . Colon cancer Neg Hx   . Esophageal cancer Neg Hx   . Stomach cancer Neg Hx   . Anesthesia problems Neg Hx   . Hypotension Neg Hx   . Malignant hyperthermia Neg Hx   . Pseudochol deficiency Neg Hx     Social History:   reports that he has quit smoking. His smoking use included cigarettes. He has a 5.00 pack-year smoking history. He has never used smokeless tobacco. He reports current alcohol use. He reports that he does not use drugs.  ROS: ROS: all other systems reviewed and are negative except as per HPI  Blood pressure (!) 146/79, pulse 80, temperature 98.5 F (36.9 C), temperature source Oral, resp. rate (!) 28, height '5\' 10"'  (1.778 m), weight 71.8 kg, SpO2 99 %. PHYSICAL EXAM: Physical Exam  GEN NAD, standing on scale HEENT EOMI PERRL NECK + some JVD PULM normal WOB, wearing O2, bibasilar crackles and scattered upper lung field crackles too CV RRR ABD nondistended, nontender EXT 1+ LE edema, distal extremities are cool NEURO AAO x 3  SKIN + multiple scabbed lesions on R flank, R and L neck, healed lesions R inner thigh and  back of R knee    Results for orders placed or performed during the hospital encounter of 04/01/19 (from the past 48 hour(s))  CBC with Differential     Status: Abnormal   Collection Time: 04/01/19  8:54 AM  Result Value Ref Range   WBC 6.0 4.0 - 10.5 K/uL   RBC 3.78 (L) 4.22 - 5.81 MIL/uL   Hemoglobin 11.4 (L) 13.0 - 17.0  g/dL   HCT 36.1 (L) 39.0 - 52.0 %   MCV 95.5 80.0 - 100.0 fL   MCH 30.2 26.0 - 34.0 pg   MCHC 31.6 30.0 - 36.0 g/dL   RDW 16.4 (H) 11.5 - 15.5 %   Platelets 214 150 - 400 K/uL   nRBC 0.0 0.0 - 0.2 %   Neutrophils Relative % 68 %   Neutro Abs 4.0 1.7 - 7.7 K/uL   Lymphocytes Relative 19 %   Lymphs Abs 1.1 0.7 - 4.0 K/uL   Monocytes Relative 8 %   Monocytes Absolute 0.5 0.1 - 1.0 K/uL   Eosinophils Relative 5 %   Eosinophils Absolute 0.3 0.0 - 0.5 K/uL   Basophils Relative 0 %   Basophils Absolute 0.0 0.0 - 0.1 K/uL   Immature Granulocytes 0 %   Abs Immature Granulocytes 0.01 0.00 - 0.07 K/uL    Comment: Performed at [facility] [address], [place], [place] [address]  Brain natriuretic peptide     Status: Abnormal   Collection Time: 04/01/19  8:54 AM  Result Value Ref Range   B Natriuretic Peptide 1,814.5 (H) 0.0 - 100.0 pg/mL    Comment: Performed at [facility] [address], [place], [name] [address]  Comprehensive metabolic panel     Status: Abnormal   Collection Time: 04/01/19  8:54 AM  Result Value Ref Range   Sodium 139 135 - 145 mmol/L   Potassium 4.8 3.5 - 5.1 mmol/L   Chloride 112 (H) 98 - 111 mmol/L   CO2 19 (L) 22 - 32 mmol/L   Glucose, Bld 197 (H) 70 - 99 mg/dL   BUN 44 (H) 8 - 23 mg/dL   Creatinine, Ser 2.06 (H) 0.61 - 1.24 mg/dL   Calcium 8.9 8.9 - 10.3 mg/dL   Total Protein 6.0 (L) 6.5 - 8.1 g/dL   Albumin 3.4 (L) 3.5 - 5.0 g/dL   AST 18 15 - 41 U/L   ALT 24 0 - 44 U/L   Alkaline Phosphatase 65 38 - 126 U/L   Total Bilirubin 0.6 0.3 - 1.2 mg/dL   GFR calc non Af Amer 31 (L) >60 mL/min   GFR calc Af Amer 35 (L)  >60 mL/min   Anion gap 8 5 - 15    Comment: Performed at [facility] [address] [place], [place] [address]  Lactic acid, plasma     Status: None   Collection Time: 04/01/19  8:54 AM  Result Value Ref Range   Lactic Acid, Venous 1.3 0.5 - 1.9 mmol/L    Comment: Performed at [facility] [address], [place], [place] [address]  SARS Coronavirus 2 (CEPHEID- Performed in [facility] hospital lab), Hosp Order     Status: None   Collection Time: 04/01/19  9:12 AM   Specimen: Nasopharyngeal Swab  Result Value Ref Range   SARS Coronavirus 2 NEGATIVE NEGATIVE    Comment: (NOTE) If result is NEGATIVE SARS-CoV-2 target nucleic acids are NOT DETECTED. The SARS-CoV-2 RNA is generally detectable in upper and lower  respiratory specimens during the acute phase of infection. The lowest  concentration of SARS-CoV-2 viral copies this assay can detect is 250  copies / mL. A negative result does not preclude SARS-CoV-2 infection  and should not be used as the sole basis for treatment or other  patient management decisions.  A negative result may occur with  improper specimen collection / handling, submission  of specimen other  than nasopharyngeal swab, presence of viral mutation(s) within the  areas targeted by this assay, and inadequate number of viral copies  (<250 copies / mL). A negative result must be combined with clinical  observations, patient history, and epidemiological information. If result is POSITIVE SARS-CoV-2 target nucleic acids are DETECTED. The SARS-CoV-2 RNA is generally detectable in upper and lower  respiratory specimens dur ing the acute phase of infection.  Positive  results are indicative of active infection with SARS-CoV-2.  Clinical  correlation with patient history and other diagnostic information is  necessary to determine patient infection status.  Positive results do  not rule out bacterial infection or co-infection with other viruses. If result  is PRESUMPTIVE POSTIVE SARS-CoV-2 nucleic acids MAY BE PRESENT.   A presumptive positive result was obtained on the submitted specimen  and confirmed on repeat testing.  While 2019 novel coronavirus  (SARS-CoV-2) nucleic acids may be present in the submitted sample  additional confirmatory testing may be necessary for epidemiological  and / or clinical management purposes  to differentiate between  SARS-CoV-2 and other Sarbecovirus currently known to infect humans.  If clinically indicated additional testing with an alternate test  methodology [id number]) is advised. The SARS-CoV-2 RNA is generally  detectable in upper and lower respiratory sp ecimens during the acute  phase of infection. The expected result is Negative. Fact Sheet for Patients:  StrictlyIdeas.no Fact Sheet for Healthcare Providers: BankingDealers.co.za This test is not yet approved or cleared by the [place] FDA and has been authorized for detection and/or diagnosis of SARS-CoV-2 by FDA under an Emergency Use Authorization (EUA).  This EUA will remain in effect (meaning this test can be used) for the duration of the COVID-19 declaration under Section 564(b)(1) of the Act, 21 U.S.C. section 360bbb-3(b)(1), unless the authorization is terminated or revoked sooner. Performed at [facility], [place] [address] [place], [place] [address]     Dg Chest Portable 1 View  Result Date: 04/01/2019 CLINICAL DATA:  Shortness of breath and congestion for 2 months. EXAM: PORTABLE CHEST 1 VIEW COMPARISON:  CT chest 02/05/2019. Plain film of the chest 02/27/2016, 02/04/2019 and 02/08/2019. FINDINGS: Left worse than right layering pleural effusions and airspace disease have worsened since the most recent examination. Heart size is upper normal. Atherosclerosis noted. No pneumothorax. IMPRESSION: Left worse than right layering effusions and airspace disease which could be atelectasis or  pneumonia worsened since the most recent examination. Atherosclerosis. Electronically Signed   By: Inge Rise M.D.   On: 04/01/2019 08:13    Assessment/Plan  1.  CKD Stage G3b: At baseline creatinine.  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  Will check UP/C.  Watch renal function closely in the setting of having received vanc in ED, valacyclovir administration, and diuresis.  I agree with Lasix administration as ordered.  He is a poor candidate for dialysis given his severe cardiomyopathy.    2.  Acute on chronic combined systolic and diastolic CHF with EF 40-08%. On carvedilol 6.25 mg BID.  Lasix 80 IV BID.  AHF consulted. No ARB/ ACEi d/t low eGFR.   3.  Shingles: on valacyclovir 1000 mg daily (appropriately renally dosed)  4.  Possible pneumonia: s/p vanc/ cefepime in ED, on azithro/ CTX for CAP now.  CT chest without contrast ordered.  5.  CAD: not a CABG candidate.  On ASA and Plavix.       6.  Dispo: pending improvement   [name] 04/01/2019, 2:52 PM

## 2019-04-01 NOTE — ED Triage Notes (Signed)
Pt c/o rash and a bump to his right hip x "weeks". Pt states pain radiates down his right leg. No drainage from bump. Pt reports a new area to his right shoulder x 3 days ago. Denies known allergies, no new medications.

## 2019-04-01 NOTE — ED Notes (Signed)
Dr. Evangeline Gula discussed code status with pt and I provided witness to the discussion. Pt stated he would not like resuscitation but would like to remain having treatment procedures if needed.

## 2019-04-01 NOTE — ED Provider Notes (Signed)
MSE was initiated and I personally evaluated the patient and placed orders (if any) at  6:15 AM on April 01, 2019.  The patient appears stable so that the remainder of the MSE may be completed by another provider.  Patient presents with concerns for rash.  On my initial evaluation, he reports "a lot is wrong."  He reports rash on his right flank and right side of his neck.  Also reports some shortness of breath.  Noted to have a tick on the right neck which was removed.  Denies any recent fevers or cough.  Denies any new exposures.  He has not taken anything for his rash.  He states that it is most painful and itchy.  On exam he has a slight area of erythema and patchiness over the right flank which is possibly shingles.  He also has a tick on the right neck.  He is wheezing on exam.    Unfortunately, patient history was interrupted by critical patient.  Initial work-up was initiated and transition of care to Dr. Billy Fischer.   Merryl Hacker, MD 04/01/19 0800

## 2019-04-01 NOTE — Consult Note (Addendum)
Cardiology Consultation:   Patient ID: [name]; [id number]; November 26, 1940   Admit date: 04/01/2019 Date of Consult: 04/01/2019  Primary Care Provider: Prince Solian, MD Primary Cardiologist: No primary care provider on file.  Advanced Heart Failure: Peter Bickers, MD  Peter Grinder, NP, 03/03/2019 Primary Electrophysiologist:  None   Patient Profile:   [name] is a 78 y.o. male with a hx of chronic combined CHF, non-ischemic cardiomyopathy, non-obstructive CAD on cath 2011,PAD s/p right fem-pop in 2017,DM type 2, HTN, HLD, stroke,CKD stage 3,tobacco abuse, who is being seen today for the evaluation of CHF exacerbation at the request of Dr [name].  History of Present Illness:   [name] was admitted 05/08-05/08/2019 with A/C biventricular HF and RUL PNA. Had RHC/LHC with volume overlaod and high grade disttal LM lesion. CT surgery consulted. LAD only graftable vessel and he was not thought to be CABG candidate given severe RV dysfunction, advanced age, fraility and CKD.Case reviewedwith Dr. Burt [name] with regard to possibility of LM stenting. It is a highly calcified lesion and with severely reduced EF would need probable Impella support but PAD likely precludes. Additionally, LV was down prior to CAD so may not improve with stenting.Medical management was recommended.   06/08 office visit ASSESSMENT & PLAN: 1. Chronic biventricular NGE:[id number] with a history of non-ischemic cardiomyopathy with EF 25-30% in 2016, improved to 35-40% on echo in 2017.  Echo this 01/2019 revealed EF 15-20%, G3DD, and diffuse hypokinesis.  - Cath 5/15 with likely high-grade distal LM lesion.  -Case has been reviewed with Dr. Prescott [name] who has reviewed images. LAD only graftable vessel and given severe RV dysfunction, advanced age, fraility and CKD not felt to be CABG candidate.Case reviewedwith Dr. Burt [name] with regard to possibility of LM stenting. It is highly calcified lesion and with severely  reduced EF would need probable Impella support but PAD likely precludes. Additionally, LV was down prior to CAD so may not improve with stenting.I suspect medical management may be best.  --LBBB may also be playing a role and need to consider CRT-D though he may be too far along.  - Plan to repeat ECHO in 3 months after HF meds optimized.  - NYHA IIIb.Volume status stable. Continue lasix 80 mg daily..  - Restart carvedilol 6.25 mg twice a day. Not sure why this was stopped.   -Continue Bidil 1 tab tid  - Continue spironolactone. - Was on losartan at home but will hold for now with marginal renal function.  - Discussed daily weights, low salt food choices, and limiting fluids to < 2 liters per day.   Weight 158 pounds 2. CAD: - Cath 5/15 with likely high-grade distal LM lesion. Medical therapy. See discussion above - No chest pain.  - On ASA. Resume plavix - Continue statin  3. RUL PNA: Most recent hospitalization. Completed antibiotic course.  4.CKD3: - labs 3 years ago with Cr 1.5. Suspect this is new baseline - Creatinine running 1.9-2.1,  - He has follow up with Nephrology later this week.  - Instructed to avoid NSAIDs.  5. History of CVA:He is on ASA 81, Plavix, and statin. 6. DM type 2:on ISS - Continue management per primary team - May be candidate for SGLT2i if CrCl > 30    Follow up in 4 weeks.   04/01/2019 Pt came to ER w/ rash, hx tick bite and SOB. Admitted w/ shingles, acute on chronic Bi-vent failure w/ BNP 1814, L- PNA. Cards asked to see for CHF.  [name] states that he has been getting worse for several days.  He does not check his weight every day, the last time he checked it was 2 days ago.  It was 142 or 145 pounds, not sure which.  He does not feel that his weight had changed much.  He gets up frequently in the night to go to the bathroom, so denies PND.  He does have some orthopnea.  He has significant dyspnea on exertion.  However, he has been  having so much pain from the shingles, that it has made everything else worse.  He also keeps finding knots on his body.  He has 2 on the left side of his neck that are not tender, 2 more on the right side of the neck, 1 of which had a tick attached when he got to the emergency room today.  He has 2 other small lesions/knots on his right leg, and one on his left.  These all have wounds on the top but none of them are open or draining.  They are not tender.  He has approximately 5 shingles lesions going down the right dermatome from the lower right scapular area down to his groin.  This whole area is exquisitely tender.  The shingles lesions are dry, not open or draining.  He denies lower extremity edema.  He denies chest pain.  He denies palpitations, does not ever feel his heart skip or race.  He denies fevers or chills.  His appetite is very poor, he has to force himself to eat.   Past Medical History:  Diagnosis Date   Allergy    Atrial fibrillation [name])    Cataract    Chronic kidney disease    Renal Insuffiecny   Colon polyps 2012   Colonoscopy   Diabetes mellitus    type 2   Diverticulosis 2012   Colonoscopy    Hyperlipidemia    Hypertension    LBBB (left bundle branch block)    Pneumonia 2015   3 times   Stroke [facility]) 2015    Past Surgical History:  Procedure Laterality Date   AMPUTATION Right 04/28/2016   Procedure: Right Transmetatarsal Amputation;  Surgeon: Peter Minion, MD;  Location: [place];  Service: Orthopedics;  Laterality: Right;   CATARACT EXTRACTION Bilateral    both eyes   COLON SURGERY     to repair intestines as child   COLONOSCOPY  10/20/2011   Procedure: COLONOSCOPY;  Surgeon: Peter Castle, MD;  Location: [facility] ENDOS[facility];  Service: Endoscopy;  Laterality: N/A;   FEMORAL-POPLITEAL BYPASS GRAFT Right 04/07/2016   Procedure: BYPASS GRAFT FEMORAL-POPLITEAL ARTERY-RIGHT;  Surgeon: Angelia Mould, MD;  Location: [facility] OR;  Service:  Vascular;  Laterality: Right;   HOT HEMOSTASIS  10/20/2011   Procedure: HOT HEMOSTASIS (ARGON PLASMA COAGULATION/BICAP);  Surgeon: Peter Castle, MD;  Location: [facility];  Service: Endoscopy;  Laterality: N/A;   KNEE ARTHROSCOPY Left    left   NECK SURGERY     disectomy   PERIPHERAL VASCULAR CATHETERIZATION Right 04/05/2016   Procedure: Lower Extremity Angiography;  Surgeon: Rosetta Posner, MD;  Location: [place] [facility];  Service: Cardiovascular;  Laterality: Right;   PERIPHERAL VASCULAR CATHETERIZATION N/A 04/05/2016   Procedure: Abdominal Aortogram;  Surgeon: Rosetta Posner, MD;  Location: [place] [facility];  Service: Cardiovascular;  Laterality: N/A;   RIGHT/LEFT HEART CATH AND CORONARY ANGIOGRAPHY N/A 02/07/2019   Procedure: RIGHT/LEFT HEART CATH AND CORONARY ANGIOGRAPHY;  Surgeon: Haroldine Laws,  Shaune Pascal, MD;  Location: [place] [facility];  Service: Cardiovascular;  Laterality: N/A;   VEIN HARVEST Right 04/07/2016   Procedure: VEIN HARVEST GREATER SAPHENOUS VIEN ;  Surgeon: Angelia Mould, MD;  Location: [place];  Service: Vascular;  Laterality: Right;     Prior to Admission medications   Medication Sig Start Date End Date Taking? Authorizing Provider  aspirin EC 81 MG EC tablet Take 1 tablet (81 mg total) by mouth daily. 02/11/19  Yes Mariel Aloe, MD  carvedilol (COREG) 6.25 MG tablet Take 1 tablet (6.25 mg total) by mouth 2 (two) times daily. 03/03/19 03/02/20 Yes Clegg, Amy D, NP  clopidogrel (PLAVIX) 75 MG tablet Take 75 mg by mouth daily.   Yes [provider]  furosemide (LASIX) 80 MG tablet Take 1 tablet (80 mg total) by mouth daily. 02/19/19  Yes Larey Dresser, MD  glimepiride (AMARYL) 1 MG tablet Take 0.5 mg by mouth 2 (two) times daily.    Yes [provider]  isosorbide-hydrALAZINE (BIDIL) 20-37.5 MG tablet Take 1 tablet by mouth 3 (three) times daily. 02/10/19  Yes Mariel Aloe, MD  metFORMIN (GLUCOPHAGE) 1000 MG tablet Take 1,000 mg by mouth  daily. At 5:00 pm   Yes [provider]  Polysaccharide Iron Complex (FERREX 150 PO) Take by mouth daily.   Yes [provider]  simvastatin (ZOCOR) 40 MG tablet Take 40 mg by mouth daily.    Yes [provider]  spironolactone (ALDACTONE) 25 MG tablet Take 0.5 tablets (12.5 mg total) by mouth daily. Patient not taking: Reported on 04/01/2019 02/10/19   Mariel Aloe, MD    Inpatient Medications: Scheduled Meds:  aspirin EC  81 mg Oral Daily   [START ON 04/02/2019] azithromycin  500 mg Oral Daily   carvedilol  6.25 mg Oral BID   clopidogrel  75 mg Oral Daily   furosemide  80 mg Intravenous BID   glimepiride  0.5 mg Oral BID WC   heparin  5,000 Units Subcutaneous Q8H   [START ON 04/02/2019] iron polysaccharides  150 mg Oral Q breakfast   potassium chloride  10 mEq Oral BID   simvastatin  40 mg Oral Daily   sodium chloride flush  3 mL Intravenous Q12H   [START ON 04/02/2019] valACYclovir  1,000 mg Oral Daily   Continuous Infusions:  sodium chloride     [START ON 04/02/2019] cefTRIAXone (ROCEPHIN)  IV     PRN Meds: sodium chloride, acetaminophen, ondansetron (ZOFRAN) IV, sodium chloride flush, zolpidem  Allergies:   No Known Allergies  Social History:   Social History   Socioeconomic History   Marital status: Married    Spouse name: Not on file   Number of children: 3   Years of education: Not on file   Highest education level: Not on file  Occupational History   Occupation: Retired  Scientist, product/process development strain: Not on file   Food insecurity    Worry: Not on file    Inability: Not on file   Transportation needs    Medical: Not on file    Non-medical: Not on file  Tobacco Use   Smoking status: Former Smoker    Packs/day: 0.25    Years: 20.00    Pack years: 5.00    Types: Cigarettes   Smokeless tobacco: Never Used   Tobacco comment: Quit June 2017  Substance and Sexual Activity   Alcohol use: Yes     Alcohol/week:  0.0 standard drinks    Comment: rarely   Drug use: No   Sexual activity: Not on file  Lifestyle   Physical activity    Days per week: Not on file    Minutes per session: Not on file   Stress: Not on file  Relationships   Social connections    Talks on phone: Not on file    Gets together: Not on file    Attends religious service: Not on file    Active member of club or organization: Not on file    Attends meetings of clubs or organizations: Not on file    Relationship status: Not on file   Intimate partner violence    Fear of current or ex partner: Not on file    Emotionally abused: Not on file    Physically abused: Not on file    Forced sexual activity: Not on file  Other Topics Concern   Not on file  Social History Narrative   Not on file    Family History:   Family History  Problem Relation Age of Onset   Diabetes Mellitus II Mother    Heart disease Brother        before age 27   Colon cancer Neg Hx    Esophageal cancer Neg Hx    Stomach cancer Neg Hx    Anesthesia problems Neg Hx    Hypotension Neg Hx    Malignant hyperthermia Neg Hx    Pseudochol deficiency Neg Hx    Family Status:  Family Status  Relation Name Status   Mother  Deceased at age 89   Father  Deceased at age 76   Brother  (Not Specified)   Neg Hx  (Not Specified)    ROS:  Please see the history of present illness.  All other ROS reviewed and negative.     Physical Exam/Data:   Vitals:   04/01/19 1415 04/01/19 1430 04/01/19 1511 04/01/19 1516  BP: 136/87 (!) 146/79  (!) 141/82  Pulse:    80  Resp: (!) 28   20  Temp:    97.8 F (36.6 C)  TempSrc:    Oral  SpO2:    100%  Weight:   69.3 kg   Height:   5\' 10"  (1.778 m)     Intake/Output Summary (Last 24 hours) at 04/01/2019 1551 Last data filed at 04/01/2019 0933 Gross per 24 hour  Intake 100 ml  Output --  Net 100 ml   Filed Weights   04/01/19 0800 04/01/19 1511  Weight: 71.8 kg 69.3 kg   Body  mass index is 21.92 kg/m.  General:  Well nourished, well developed, in no acute distress HEENT: normal Lymph: no adenopathy Neck: JVD 10-11 cm Endocrine:  No thryomegaly Vascular: No carotid bruits; 4/4 extremity pulses 2+, without bruits  Cardiac:  normal S1, S2; RRR; no murmur  Lungs:  clear to auscultation bilaterally, no wheezing, rhonchi or rales  Abd: soft, nontender, no hepatomegaly  Ext: no edema Musculoskeletal:  No deformities, BUE and BLE strength normal and equal Skin: warm and dry  Neuro:  CNs 2-12 intact, no focal abnormalities noted Psych:  Normal affect   EKG:  The EKG was personally reviewed and demonstrates:  SR, HR 98, LBBB is old Telemetry:  Telemetry was personally reviewed and demonstrates:  SR  Relevant CV Studies:  ReDS Vest - 03/03/19 1000            ReDS Vest   MR  No    Fitting Posture  Sitting    Height Marker  Tall   station C   Ruler Value  27    Center Strip  Aligned    ReDS Value  38       ECHO: 02/05/2019  1. The cavity size was normal. There is mildly increased left ventricular wall thickness. Left ventricular diastolic Doppler parameters are consistent with restrictive filling. Elevated left ventricular end-diastolic pressure Left ventricular diffuse  hypokinesis, EF 15-20%.  2. The right ventricle has moderately reduced systolic function. The cavity was normal. There is no increase in right ventricular wall thickness.  3. Left atrial size was moderately dilated.  4. Left pleural effusion is present.  5. Mild thickening of the mitral valve leaflet. Mitral valve regurgitation is moderate by color flow Doppler, suspect functional MR. No evidence of mitral valve stenosis.  6. The aortic valve is tricuspid. Moderate calcification of the aortic valve. Aortic valve regurgitation is trivial by color flow Doppler. No stenosis of the aortic valve.  7. Trivial pericardial effusion is present.  8. The inferior vena cava was dilated in  size with <50% respiratory variability. PA systolic pressure 45 mmHg.  CATH: RHC/LHC 02/07/2019   Mid RCA to Dist RCA lesion is 50% stenosed.  Prox RCA lesion is 40% stenosed.  Mid LM to Dist LM lesion is 80% stenosed.  RPDA lesion is 90% stenosed.  Ost Cx to Prox Cx lesion is 80% stenosed.  Dist LM to Ost LAD lesion is 60% stenosed.  Prox Cx to Mid Cx lesion is 50% stenosed.  Prox LAD to Mid LAD lesion is 70% stenosed. Findings: Ao = 136/76 (97) LV = 139/28 RA = 9 RV = 65/10 PA = 67/29 (48) PCW = 36 Fick cardiac output/index = 4.1/2.2 PVR = 3.0 WU SVR = 1721 Assessment: 1. Severe 2v CAD with probable high grade, calcified distal left main lesion 2. EF 20-25% due to iCM 3. Elevated filling pressures with moderately reduced CO  Echo  EF 40-45% in 2015  EF 25-30% in 2016 EF 35-40% in 2017 EF 15-20% in 2020Severer RV dysfunction.   Laboratory Data:  Chemistry Recent Labs  Lab 04/01/19 0854  NA 139  K 4.8  CL 112*  CO2 19*  GLUCOSE 197*  BUN 44*  CREATININE 2.06*  CALCIUM 8.9  GFRNONAA 31*  GFRAA 35*  ANIONGAP 8    Lab Results  Component Value Date   ALT 24 04/01/2019   AST 18 04/01/2019   ALKPHOS 65 04/01/2019   BILITOT 0.6 04/01/2019   Hematology Recent Labs  Lab 04/01/19 0854  WBC 6.0  RBC 3.78*  HGB 11.4*  HCT 36.1*  MCV 95.5  MCH 30.2  MCHC 31.6  RDW 16.4*  PLT 214   High Sensitivity Troponin:  No results for input(s): TROPONINIHS in the last 720 hours.    BNP Recent Labs  Lab 04/01/19 0854  BNP 1,814.5*    TSH:  Lab Results  Component Value Date   TSH 1.690 02/05/2019   Lipids: Lab Results  Component Value Date   CHOL 97 02/28/2016   HDL 28 (L) 02/28/2016   LDLCALC 51 02/28/2016   TRIG 92 02/28/2016   CHOLHDL 3.5 02/28/2016   HgbA1c: Lab Results  Component Value Date   HGBA1C 7.1 (H) 03/27/2016   Magnesium:  Magnesium  Date Value Ref Range Status  02/05/2019 1.6 (L) 1.7 - 2.4 mg/dL Final    Comment:     Performed at  [facility], [place] [address] [place], [place] [address]     Radiology/Studies:  Ct Chest Wo Contrast  Result Date: 04/01/2019 CLINICAL DATA:  Hypoxia, pneumonia versus CHF. Interstitial lung disease. EXAM: CT CHEST WITHOUT CONTRAST TECHNIQUE: Multidetector CT imaging of the chest was performed following the standard protocol without IV contrast. COMPARISON:  02/05/2019 FINDINGS: Cardiovascular: Cardiomegaly without pericardial effusion. Extensive coronary calcification. There is also aortic atherosclerotic calcification. No acute vascular finding without contrast. Mediastinum/Nodes: Mild prominence of mediastinal lymph nodes that is likely congestive. No worrisome adenopathy. Negative esophagus. Lungs/Pleura: Moderate layering pleural effusions with patchy atelectasis. There is also ground-glass opacity in the primarily apically lungs. This same pattern was seen on 02/05/2019 chest CT, although the airspace opacity in the upper lobes is greater today. Inferior lingular segment was also collapsed on prior, but the central airways appear patent. Negative coronavirus in the chart today. Upper Abdomen: No acute abnormality. Musculoskeletal: No acute or aggressive finding. Symmetric gynecomastia IMPRESSION: Moderate bilateral pleural effusion, atelectasis, and upper lobe airspace disease. This same pattern was seen on 02/05/2019 chest CT and may be congestive, although need correlation for pneumonia as the airspace opacities are patchy and asymmetric. Electronically Signed   By: Monte Fantasia M.D.   On: 04/01/2019 14:54   Dg Chest Portable 1 View  Result Date: 04/01/2019 CLINICAL DATA:  Shortness of breath and congestion for 2 months. EXAM: PORTABLE CHEST 1 VIEW COMPARISON:  CT chest 02/05/2019. Plain film of the chest 02/27/2016, 02/04/2019 and 02/08/2019. FINDINGS: Left worse than right layering pleural effusions and airspace disease have worsened since the most recent examination. Heart  size is upper normal. Atherosclerosis noted. No pneumothorax. IMPRESSION: Left worse than right layering effusions and airspace disease which could be atelectasis or pneumonia worsened since the most recent examination. Atherosclerosis. Electronically Signed   By: Inge Rise M.D.   On: 04/01/2019 08:13    Assessment and Plan:   1.  Acute on chronic biventricular CHF: -At his office visit on 6/8, his weight was 158 pounds, but his [place] eating was 35, at target -He was restarted on Coreg and BiDil, losartan on hold, continue Lasix 80 mg a day and Spironolactone 12.5 mg a day -However, the patient reported that he had stopped the Spironolactone on admission today. - Standing weight in the room after admission was 152 pounds. -According to the patient, he has been weighing 142-145 pounds at home, compliance imperfect - His BNP is above his baseline and his chest x-ray shows fluid - Creatinine, although high, is at his recent baseline. However, BUN is climbing. -He got 1 dose of IV Lasix 80 mg in the emergency room, and has been started on Lasix 80 mg IV twice daily, continue this today, re-assess in am - In the setting of acute illness (?developing sepsis), limit diuresis to prn.  -- follow strict intake/output, daily weights and renal function - He is on limited potassium supplementation at 10 mEq twice daily because of his renal function. -He is not currently on BiDil or spironolactone, hold off on restarting these and hold   2.  Shingles with possible zoster pneumonia - Started on antibiotics for CAP. - Blood cultures, procalcitonin and HIV antibody pending -Per IM  3.  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   Signed, Rosaria Ferries, PA-C  04/01/2019 3:51 PM   Patient seen and examined with the above-signed Advanced Practice Provider and/or Housestaff. I personally reviewed laboratory data, imaging studies and relevant notes. I independently examined the patient and formulated the important aspects of the plan. I have edited the note to reflect any of my changes or salient points. I have personally discussed the plan with the patient and/or family.  78 y/o male with multiple medical problems including severe CAD, systolic HF due to iCM last EF 15%, CKD 3 with baseline creatinine ~2.0. Admitted with shingles. Found to have diffuse bilateral infiltrates concerning for viral PNA as well as volume overload. Weiight up about 7 pounds from baseline weight at home.   On exam JVP is up. He is regular. Lungs with diffuse crackles minimal edema.   In reviewing CT main respiratory issue seems to be diffuse PNA but also does have some evidence of volume overload on exam. No s/s angina.   Agree with continuing IV diuresis for now as he is responding well but would be careful not overdiurese. Watch renal function closely. We will follow with you.   Peter Bickers, MD  4:49 PM

## 2019-04-01 NOTE — ED Notes (Signed)
Pt desat to 81-83% Pt titrated up to 5L/O2 Pt sat 94%

## 2019-04-01 NOTE — ED Provider Notes (Addendum)
[facility] EMERGENCY DEPARTMENT Provider Note   CSN: [id number] Arrival date & time: 04/01/19  0124    History   Chief Complaint Chief Complaint  Patient presents with  . Rash    HPI [name] is a 78 y.o. male.     HPI  78 year old male with a history of chronic systolic heart failure most recent EF 15 to 20%, history of stroke, diabetes, CKD stage III, admission to [facility] May 12-18 with concern for pneumonia and CHF, presents with concern for continuing shortness of breath and rash.  Patient reports a painful rash over his right flank.  Noted to have a tick on the right side of his neck which was removed by Dr. Dina Rich.  He reports that he has had continuing shortness of breath since he was discharged from the hospital at the end of May.  Specifically he reports 1 week after discharge she had an episode of diaphoresis, and after that he is felt that his lungs have been congested.  Feels that if he coughs very hard he is able to raise gray sputum.  Denies fevers, change in appetite, nausea, vomiting, body aches.  Notes he has had continuing bilateral lower extremity edema that has been present since his last hospitalization and unchanged.  Reports orthopnea.  Denies chest pain.  Reports he does not use home oxygen or have a history of lung disease.  Past Medical History:  Diagnosis Date  . Allergy   . Atrial fibrillation ([place])   . Cataract   . Chronic kidney disease    Renal Insuffiecny  . Colon polyps 2012   Colonoscopy  . Diabetes mellitus    type 2  . Diverticulosis 2012   Colonoscopy   . Hyperlipidemia   . Hypertension   . LBBB (left bundle branch block)   . Pneumonia 2015   3 times  . Stroke [facility]) 2015    Patient Active Problem List   Diagnosis Date Noted  . Community acquired pneumonia of left lower lobe of lung ([place]) 02/05/2019  . CKD (chronic kidney disease) stage 3, GFR 30-59 ml/min (HCC) 02/05/2019  . Acute CHF (congestive  heart failure) ([place]) 02/05/2019  . Acute on chronic congestive heart failure ([place]) 02/04/2019  . Aftercare following surgery of the circulatory system 08/09/2016  . 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 Cellulitis and abscess of foot 03/23/2016  . Diabetic infection of right foot ([place]) 03/23/2016  . Puncture wound of foot, right, complicated 93/81/8299  . Malnutrition of moderate degree 03/23/2016  . AKI (acute kidney injury) ([place])   . Tobacco use disorder 02/28/2016  . Acute CVA (cerebrovascular accident) ([place]) 02/27/2016  . DM2 (diabetes mellitus, type 2) ([place]) 02/27/2016  . Anemia 02/27/2016  . Chronic systolic CHF (congestive heart failure) ([place]) 05/05/2015  . 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 Essential hypertension 07/16/2014  . Former smoker 04/21/2014  . Thrombotic stroke ([place]) 01/23/2014  . Dizziness 01/23/2014  . Diabetes ([place]) 01/23/2014  . Facial droop 01/23/2014  . Benign neoplasm of colon 08/25/2011  . CAD, NATIVE VESSEL 05/23/2010  . DIABETIC  RETINOPATHY 03/21/2010  . Hyperlipidemia 03/21/2010  . NEUROPATHY 03/21/2010  . HYPERTENSION 03/21/2010  . LBBB (left bundle branch block) 03/21/2010  . 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 CERVICAL RADICULOPATHY 03/21/2010    Past Surgical  History:  Procedure Laterality Date  . AMPUTATION Right 04/28/2016   Procedure: Right Transmetatarsal Amputation;  Surgeon: Newt Minion, MD;  Location: [facility];  Service: Orthopedics;  Laterality: Right;  . CATARACT EXTRACTION Bilateral    both eyes  . COLON SURGERY     to repair intestines as child  . COLONOSCOPY  10/20/2011   Procedure: COLONOSCOPY;  Surgeon: Inda Castle, MD;  Location: [facility];  Service: Endoscopy;  Laterality: N/A;  . FEMORAL-POPLITEAL BYPASS GRAFT Right 04/07/2016   Procedure: BYPASS  GRAFT FEMORAL-POPLITEAL ARTERY-RIGHT;  Surgeon: Angelia Mould, MD;  Location: [facility];  Service: Vascular;  Laterality: Right;  . HOT HEMOSTASIS  10/20/2011   Procedure: HOT HEMOSTASIS (ARGON PLASMA COAGULATION/BICAP);  Surgeon: Inda Castle, MD;  Location: [facility];  Service: Endoscopy;  Laterality: N/A;  . KNEE ARTHROSCOPY Left    left  . NECK SURGERY     disectomy  . PERIPHERAL VASCULAR CATHETERIZATION Right 04/05/2016   Procedure: Lower Extremity Angiography;  Surgeon: Rosetta Posner, MD;  Location: [facility];  Service: Cardiovascular;  Laterality: Right;  . PERIPHERAL VASCULAR CATHETERIZATION N/A 04/05/2016   Procedure: Abdominal Aortogram;  Surgeon: Rosetta Posner, MD;  Location: [facility];  Service: Cardiovascular;  Laterality: N/A;  . RIGHT/LEFT HEART CATH AND CORONARY ANGIOGRAPHY N/A 02/07/2019   Procedure: RIGHT/LEFT HEART CATH AND CORONARY ANGIOGRAPHY;  Surgeon: Jolaine Artist, MD;  Location: [facility];  Service: Cardiovascular;  Laterality: N/A;  . VEIN HARVEST Right 04/07/2016   Procedure: VEIN HARVEST GREATER SAPHENOUS VIEN ;  Surgeon: Angelia Mould, MD;  Location: [facility];  Service: Vascular;  Laterality: Right;        Home Medications    Prior to Admission medications   Medication Sig Start Date End Date Taking? Authorizing Provider  aspirin EC 81 MG EC tablet Take 1 tablet (81 mg total) by mouth daily. Patient not taking: Reported on 04/01/2019 02/11/19   Mariel Aloe, MD  carvedilol (COREG) 6.25 MG tablet Take 1 tablet (6.25 mg total) by mouth 2 (two) times daily. 03/03/19 03/02/20  Clegg, Amy D, NP  clopidogrel (PLAVIX) 75 MG tablet Take 75 mg by mouth daily.    [provider]  furosemide (LASIX) 80 MG tablet Take 1 tablet (80 mg total) by mouth daily. 02/19/19   Larey Dresser, MD  glimepiride (AMARYL) 1 MG tablet Take 0.5 mg by mouth 2 (two) times daily.     [provider]  isosorbide-hydrALAZINE (BIDIL)  20-37.5 MG tablet Take 1 tablet by mouth 3 (three) times daily. 02/10/19   Mariel Aloe, MD  metFORMIN (GLUCOPHAGE) 1000 MG tablet Take 1,000 mg by mouth 2 (two) times daily with a meal.    [provider]  montelukast (SINGULAIR) 10 MG tablet Take 10 mg by mouth at bedtime.    [provider]  Polysaccharide Iron Complex (FERREX 150 PO) Take by mouth daily.    [provider]  simvastatin (ZOCOR) 40 MG tablet Take 40 mg by mouth daily.     [provider]  spironolactone (ALDACTONE) 25 MG tablet Take 0.5 tablets (12.5 mg total) by mouth daily. 02/10/19   Mariel Aloe, MD    Family History Family History  Problem Relation Age of Onset  . Diabetes Mellitus II Mother   . Heart disease Brother        before age 42  . Colon cancer Neg Hx   . Esophageal  cancer Neg Hx   . Stomach cancer Neg Hx   . Anesthesia problems Neg Hx   . Hypotension Neg Hx   . Malignant hyperthermia Neg Hx   . Pseudochol deficiency Neg Hx     Social History Social History   Tobacco Use  . Smoking status: Former Smoker    Packs/day: 0.25    Years: 20.00    Pack years: 5.00    Types: Cigarettes  . Smokeless tobacco: Never Used  . Tobacco comment: Quit June 2017  Substance Use Topics  . Alcohol use: Yes    Alcohol/week: 0.0 standard drinks    Comment: rarely  . Drug use: No     Allergies   Patient has no known allergies.   Review of Systems Review of Systems  Constitutional: Negative for fever.  HENT: Negative for sore throat.   Eyes: Negative for visual disturbance.  Respiratory: Positive for cough and shortness of breath.   Cardiovascular: Positive for leg swelling. Negative for chest pain.  Gastrointestinal: Negative for abdominal pain, nausea and vomiting.  Genitourinary: Negative for difficulty urinating.  Musculoskeletal: Negative for back pain and neck stiffness.  Skin: Positive for rash.  Neurological: Negative for syncope and headaches.      Physical Exam Updated Vital Signs BP (!) 149/74   Pulse 88   Temp 98.5 F (36.9 C) (Oral)   Resp 17   SpO2 97%   Physical Exam Vitals signs and nursing note reviewed.  Constitutional:      General: He is not in acute distress.    Appearance: He is well-developed. He is not diaphoretic.  HENT:     Head: Normocephalic and atraumatic.  Eyes:     Conjunctiva/sclera: Conjunctivae normal.  Neck:     Musculoskeletal: Normal range of motion.     Comments: JVD  Cardiovascular:     Rate and Rhythm: Normal rate and regular rhythm.     Heart sounds: Normal heart sounds.  Pulmonary:     Effort: Pulmonary effort is normal. No respiratory distress.     Breath sounds: Rhonchi (bilateral diffuse) present. No wheezing or rales.  Abdominal:     General: There is no distension.     Palpations: Abdomen is soft.     Tenderness: There is no abdominal tenderness. There is no guarding.  Musculoskeletal:     Right lower leg: Edema present.     Left lower leg: Edema present.  Skin:    General: Skin is warm and dry.     Findings: Rash (scattered scabbing papules right hip, right flank) present.  Neurological:     Mental Status: He is alert and oriented to person, place, and time.      ED Treatments / Results  Labs (all labs ordered are listed, but only abnormal results are displayed) Labs Reviewed  CULTURE, BLOOD (ROUTINE X 2)  CULTURE, BLOOD (ROUTINE X 2)  SARS CORONAVIRUS 2 (HOSPITAL ORDER, [facility])  CBC WITH DIFFERENTIAL/PLATELET  BRAIN NATRIURETIC PEPTIDE  COMPREHENSIVE METABOLIC PANEL  LACTIC ACID, PLASMA  LACTIC ACID, PLASMA    EKG None  Radiology No results found.  Procedures .Critical Care Performed by: Gareth Morgan, MD Authorized by: Gareth Morgan, MD   Critical care provider statement:    Critical care time (minutes):  30   Critical care was time spent personally by me on the following activities:  Evaluation of patient's  response to treatment, examination of patient, ordering and performing treatments and interventions, ordering and  review of laboratory studies, ordering and review of radiographic studies, pulse oximetry, re-evaluation of patient's condition, obtaining history from patient or surrogate and review of old charts   (including critical care time)  Medications Ordered in ED Medications  albuterol (VENTOLIN HFA) 108 (90 Base) MCG/ACT inhaler 2 puff (has no administration in time range)  diphenhydrAMINE (BENADRYL) capsule 25 mg (25 mg Oral Given 04/01/19 0616)  valACYclovir (VALTREX) tablet 1,000 mg (1,000 mg Oral Given 04/01/19 0616)  acetaminophen (TYLENOL) tablet 650 mg (650 mg Oral Given 04/01/19 7510)     Initial Impression / Assessment and Plan / ED Course  I have reviewed the triage vital signs and the nursing notes.  Pertinent labs & imaging results that were available during my care of the patient were reviewed by me and considered in my medical decision making (see chart for details).        78 year old male with a history of chronic systolic heart failure most recent EF 15 to 20%, history of stroke, diabetes, CKD stage III, admission to [facility] May 12-18 with concern for pneumonia and CHF, presents with concern for continuing shortness of breath and rash.  Dr. Dina Rich completed in [facility] on patient, and noted rash concerning for early shingles, and it removed a tick from his right neck.  Agree that rash noted may represent early shingles given diffuse pain over the area, few scattered lesions.  He does not have appearance of abscess.  Oxygen saturations down to 81 to 83%.  Differential diagnosis includes pneumonia, CHF, COVID-19.    X-ray completed showing left sided consolidation on my evaluation. Ordered blood cx, vanc/cefepime for HCAP.  Also suspect element of CHF contributing to dyspnea and hypoxia.  Labs stable. Will admit given acute hypoxic respiratory failure likely secondary to HCAP,  possible CHF.   Final Clinical Impressions(s) / ED Diagnoses   Final diagnoses:  Hypoxia  Herpes zoster without complication  HCAP (healthcare-associated pneumonia)  Tick bite, initial encounter    ED Discharge Orders    None       Gareth Morgan, MD 04/01/19 1015    Gareth Morgan, MD 04/28/19 2246

## 2019-04-01 NOTE — H&P (Signed)
History and Physical    [name] ELF:[id number] DOB: 1940/12/27 DOA: 04/01/2019  PCP: Prince Solian, MD  Patient coming from: Home  I have personally briefly reviewed patient's old medical records in [place]  Chief Complaint: Multiple complaints including painful rash, tick bite, and shortness of breath  HPI: [name] is a 78 y.o. 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  Has a rash with several pustules on his chest by ED to be related to zoster and started on valacyclovir, also was noted to have a tick on his neck which was removed with no evidence of rash at the present time, but more significantly complains of shortness of breath that has been present since discharge in May.  His shortness of breath has been worse recently.  Discharged from our facility after admission May 12 through eighth.  He underwent cardiac catheterization was found to have coronary disease suggesting need for CABG.  Was evaluated by Dr. Darcey Nora and felt not to be a candidate due to degree of medical illnesses.  His EF is 15 to 20% at that evaluation.  Since discharge from the hospital he has had continuing shortness of breath.  He saw the nephrologist who recommended stopping metformin but did not order it stopped referred him back to his primary but also stopped his BiDil.  He then saw his cardiology NP who recommended restarting the BiDil.  This has been very confusing for the patient and his wife I do not know which is the best approach to take regarding his medications.  1 week after discharge the patient did have an episode of diaphoresis and after that felt that his lungs were congested.  He feels that if he coughs very hard he is able to get some gray sputum but not much on a daily basis.  He has had no fevers, nausea, vomiting,  change in appetite, body aches, or sweating.  He has had continued bilateral lower extremity edema that has been present since his last hospitalization and is unchanged.  He is concerned about orthopnea and some PND.  He denies any chest pain.  Does not use any home oxygen or have a history of lung disease.  ED Course: Tick removed, medications ordered for suspected shingles, BNP markedly elevated 1814 (previously 465), creatinine 2.06 (stable) glucose 197 (average for him) potassium 4.8, chest x-ray shows left-sided pneumonia.  Paucity of symptoms associated with pneumonia therefore noncontrast CT scan of the chest was ordered.  Review of Systems: As per HPI otherwise all other systems reviewed and  negative.   Past Medical History:  Diagnosis Date   Allergy    Atrial fibrillation [facility])    Cataract    Chronic kidney disease    Renal Insuffiecny   Colon polyps 2012   Colonoscopy   Diabetes mellitus    type 2   Diverticulosis 2012   Colonoscopy    Hyperlipidemia    Hypertension    LBBB (left bundle branch block)    Pneumonia 2015   3 times   Stroke [facility]) 2015    Past Surgical History:  Procedure Laterality Date   AMPUTATION Right 04/28/2016   Procedure: Right Transmetatarsal Amputation;  Surgeon: Newt Minion, MD;  Location: [place];  Service: Orthopedics;  Laterality: Right;   CATARACT EXTRACTION Bilateral    both eyes  COLON SURGERY     to repair intestines as child   COLONOSCOPY  10/20/2011   Procedure: COLONOSCOPY;  Surgeon: Inda Castle, MD;  Location: [facility];  Service: Endoscopy;  Laterality: N/A;   FEMORAL-POPLITEAL BYPASS GRAFT Right 04/07/2016   Procedure: BYPASS GRAFT FEMORAL-POPLITEAL ARTERY-RIGHT;  Surgeon: Angelia Mould, MD;  Location: [facility] OR;  Service: Vascular;  Laterality: Right;   HOT HEMOSTASIS  10/20/2011   Procedure: HOT HEMOSTASIS (ARGON PLASMA COAGULATION/BICAP);  Surgeon: Inda Castle, MD;  Location: [facility];  Service:  Endoscopy;  Laterality: N/A;   KNEE ARTHROSCOPY Left    left   NECK SURGERY     disectomy   PERIPHERAL VASCULAR CATHETERIZATION Right 04/05/2016   Procedure: Lower Extremity Angiography;  Surgeon: Rosetta Posner, MD;  Location: [place] CV LAB;  Service: Cardiovascular;  Laterality: Right;   PERIPHERAL VASCULAR CATHETERIZATION N/A 04/05/2016   Procedure: Abdominal Aortogram;  Surgeon: Rosetta Posner, MD;  Location: [place] CV LAB;  Service: Cardiovascular;  Laterality: N/A;   RIGHT/LEFT HEART CATH AND CORONARY ANGIOGRAPHY N/A 02/07/2019   Procedure: RIGHT/LEFT HEART CATH AND CORONARY ANGIOGRAPHY;  Surgeon: Jolaine Artist, MD;  Location: [place] CV LAB;  Service: Cardiovascular;  Laterality: N/A;   VEIN HARVEST Right 04/07/2016   Procedure: VEIN HARVEST GREATER SAPHENOUS VIEN ;  Surgeon: Angelia Mould, MD;  Location: [facility];  Service: Vascular;  Laterality: Right;    Social History   Social History Narrative   Not on file     reports that he has quit smoking. His smoking use included cigarettes. He has a 5.00 pack-year smoking history. He has never used smokeless tobacco. He reports current alcohol use. He reports that he does not use drugs.  No Known Allergies  Family History  Problem Relation Age of Onset   Diabetes Mellitus II Mother    Heart disease Brother        before age 11   Colon cancer Neg Hx    Esophageal cancer Neg Hx    Stomach cancer Neg Hx    Anesthesia problems Neg Hx    Hypotension Neg Hx    Malignant hyperthermia Neg Hx    Pseudochol deficiency Neg Hx      Prior to Admission medications   Medication Sig Start Date End Date Taking? Authorizing Provider  aspirin EC 81 MG EC tablet Take 1 tablet (81 mg total) by mouth daily. 02/11/19  Yes Mariel Aloe, MD  carvedilol (COREG) 6.25 MG tablet Take 1 tablet (6.25 mg total) by mouth 2 (two) times daily. 03/03/19 03/02/20 Yes Clegg, Amy D, NP  clopidogrel (PLAVIX) 75 MG tablet Take 75  mg by mouth daily.   Yes [provider]  furosemide (LASIX) 80 MG tablet Take 1 tablet (80 mg total) by mouth daily. 02/19/19  Yes Larey Dresser, MD  glimepiride (AMARYL) 1 MG tablet Take 0.5 mg by mouth 2 (two) times daily.    Yes [provider]  isosorbide-hydrALAZINE (BIDIL) 20-37.5 MG tablet Take 1 tablet by mouth 3 (three) times daily. 02/10/19  Yes Mariel Aloe, MD  metFORMIN (GLUCOPHAGE) 1000 MG tablet Take 1,000 mg by mouth daily. At 5:00 pm   Yes [provider]  Polysaccharide Iron Complex (FERREX 150 PO) Take by mouth daily.   Yes [provider]  simvastatin (ZOCOR) 40 MG tablet Take 40 mg by mouth daily.    Yes [provider]  spironolactone (ALDACTONE) 25 MG tablet Take 0.5  tablets (12.5 mg total) by mouth daily. Patient not taking: Reported on 04/01/2019 02/10/19   Mariel Aloe, MD    Physical Exam:  Constitutional: NAD, calm, slightly increased respiratory rate without use of accessory muscles Vitals:   04/01/19 1030 04/01/19 1045 04/01/19 1100 04/01/19 1115  BP: 127/82 129/77 (!) 158/86 (!) 137/91  Pulse:  74 81 94  Resp: (!) 23 (!) 26 (!) 26 (!) 30  Temp:      TempSrc:      SpO2:  90% 97% 95%  Weight:      Height:       Eyes: PERRL, lids and conjunctivae normal ENMT: Mucous membranes are moist. Posterior pharynx clear of any exudate or lesions.Normal dentition.  Neck: normal, supple, no masses, no thyromegaly Respiratory: Breath sounds at the bilateral bases with dullness to percussion one quarter of the way up, no wheezing, rales halfway up above the dullness to percussion.  Slightly increased respiratory effort. No accessory muscle use.  Cardiovascular: Regular rate and rhythm, no murmurs / rubs / gallops.  2 - 3+ extremity edema. 2+ pedal pulses. No carotid bruits.  Positive jugular venous distention, positive HJR Abdomen: no tenderness, no masses palpated. No hepatosplenomegaly. Bowel sounds positive.    Musculoskeletal: no clubbing / cyanosis. No joint deformity upper and lower extremities. Good ROM, no contractures. Normal muscle tone.  Skin: no lesions, ulcers. No induration; patient on the patient's right hip and below the armpit on the right both consistent with pustular shins seen in zoster or chickenpox Neurologic: CN 2-12 grossly intact. Sensation intact, DTR normal. Strength 5/5 in all 4.  Psychiatric: Normal judgment and insight. Alert and oriented x 3. Normal mood.    Labs on Admission: I have personally reviewed following labs and imaging studies  CBC: Recent Labs  Lab 04/01/19 0854  WBC 6.0  NEUTROABS 4.0  HGB 11.4*  HCT 36.1*  MCV 95.5  PLT 789   Basic Metabolic Panel: Recent Labs  Lab 04/01/19 0854  NA 139  K 4.8  CL 112*  CO2 19*  GLUCOSE 197*  BUN 44*  CREATININE 2.06*  CALCIUM 8.9   GFR: Estimated Creatinine Clearance: 31.5 mL/min (A) (by C-G formula based on SCr of 2.06 mg/dL (H)). Liver Function Tests: Recent Labs  Lab 04/01/19 0854  AST 18  ALT 24  ALKPHOS 65  BILITOT 0.6  PROT 6.0*  ALBUMIN 3.4*   Urine analysis:    Component Value Date/Time   COLORURINE YELLOW 02/04/2019 1930   APPEARANCEUR CLEAR 02/04/2019 1930   LABSPEC 1.019 02/04/2019 1930   PHURINE 5.0 02/04/2019 1930   GLUCOSEU NEGATIVE 02/04/2019 1930   HGBUR NEGATIVE 02/04/2019 1930   BILIRUBINUR NEGATIVE 02/04/2019 1930   KETONESUR NEGATIVE 02/04/2019 1930   PROTEINUR 100 (A) 02/04/2019 1930   UROBILINOGEN 0.2 01/23/2014 1600   NITRITE NEGATIVE 02/04/2019 1930   LEUKOCYTESUR NEGATIVE 02/04/2019 1930    Radiological Exams on Admission: Dg Chest Portable 1 View  Result Date: 04/01/2019 CLINICAL DATA:  Shortness of breath and congestion for 2 months. EXAM: PORTABLE CHEST 1 VIEW COMPARISON:  CT chest 02/05/2019. Plain film of the chest 02/27/2016, 02/04/2019 and 02/08/2019. FINDINGS: Left worse than right layering pleural effusions and airspace disease have worsened since  the most recent examination. Heart size is upper normal. Atherosclerosis noted. No pneumothorax. IMPRESSION: Left worse than right layering effusions and airspace disease which could be atelectasis or pneumonia worsened since the most recent examination. Atherosclerosis. Electronically Signed   By: Inge Rise  M.D.   On: 04/01/2019 08:13    EKG: Independently reviewed.  Sinus rhythm with left atrial enlargement and left bundle branch block when compared to 02/05/2019 rate is increased.  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  Acute respiratory failure with hypoxia: Differential diagnosis includes congestive heart failure versus  pneumonia versus combination of both.  Patient is requiring 5 L of oxygen and is usually on room air.  We will continue with oxygen supplementation and attempt treatment of both problems now determining etiology.  2.  Probable pneumonia of the left lower lobe of the lung: I am not convinced that the patient has a pneumonia.  He may actually have an exacerbation of heart failure with left edema greater than right sided lung edema.  Differential diagnosis for probable pneumonia includes community-acquired pneumonia versus zoster pneumonia.  Patient may have a chickenpox pneumonia.   He has had symptoms since May with his lungs as well as having had the lesions on his chest for quite some time about 6 weeks.  Lesion on the hip is the newer one.  We will check a respiratory viral panel as well as a procalcitonin to help determine bacterial versus viral etiology.  Will start antibiotics for community-acquired pneumonia as the patient does not appear very toxic.  3.  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  He was initially told to stop his BiDil then told to restart his BiDil.  Family is confused about which he should be doing.  They have also stopped his Spironolactone.  Not sure why that was done.  Presently I will hold BiDil and spironolactone awaiting input from both nephrology and cardiology.  Continue with IV Lasix and start patient on heart failure pathway.  4.  Likely shingles outbreak versus acute chickenpox: Concerned that patient's pneumonia may have a viral component although he has ruled out for COVID-19 it is possible that he may have a varicella pneumonia.  Valacyclovir has been started for presumed zoster and will be continued while hospitalized.  4.  Diabetes type 2 with renal complications: We will start sliding scale insulin coverage.  Hold metformin.  5.  Hyperlipidemia: Noted continue home medication management.  6.  Primary hypertension noted medication management currently being discerned regarding renal versus cardiac recommendations.  7.  Coronary artery disease of native vessel: Continue aspirin and Plavix.  8.  Left bundle branch block: Noted chronic problem continue monitor.  9.  Goals of care: I personally had a discussion with the patient with tech Chevis Pretty present.  The patient does not wish to be resuscitated but does wish to have additional treatments hospitalizations and  pressors given to him for treatments.  I personally called and spoke with the patient's wife, and discussed with her the patient's wishes.  I also discussed with the patient and his wife the complicated nature of his illnesses including heart failure complicated by chronic kidney disease and diabetes as well as current respiratory failure.  This is ominous given that he currently has multisystem organ failure.  Palliative care has been consulted for further education and discussion with patient and family.  DVT prophylaxis: Subcu heparin Code Status: Do not attempt resuscitation Family Communication: Patient's wife [name] at [phone].  Extensive discussion as detailed above Disposition Plan: Likely home in 4 days Consults called: Cardiology, Dr. Marlou Porch; nephrology Dr. Hollie Salk; palliative care Admission status: Inpatient  Admit - It is my clinical opinion that admission to INPATIENT is reasonable and necessary because of the expectation that this patient will require hospital care that crosses at least 2 midnights to treat this condition based on the medical complexity of the problems presented.  Given the aforementioned information, the predictability of an adverse outcome is felt to be significant.   Lady Deutscher MD FACP [facility] Pager [phone]  How to contact the [facility] Attending or Consulting provider [place] or covering provider during after hours [place], for this patient?  1. Check the care team in [facility] and look for a) attending/consulting [facility] provider listed and b) the [facility] team listed 2. Log into [email or web address] and use [facility]'s universal password to access. If you do not have the password, please contact the hospital operator. 3. Locate the [facility] provider you are looking for under [facility] and page to a number that you can be directly reached. 4. If you still have difficulty reaching the provider, please page the [facility] (Director on Call) for the Hospitalists  listed on amion for assistance.  If 7PM-7AM, please contact night-coverage [email or web address] Password [email or web address]  04/01/2019, 1:36 PM

## 2019-04-02 DIAGNOSIS — Z66 Do not resuscitate: Secondary | ICD-10-CM

## 2019-04-02 DIAGNOSIS — B029 Zoster without complications: Secondary | ICD-10-CM

## 2019-04-02 DIAGNOSIS — I130 Hypertensive heart and chronic kidney disease with heart failure and stage 1 through stage 4 chronic kidney disease, or unspecified chronic kidney disease: Principal | ICD-10-CM

## 2019-04-02 DIAGNOSIS — J181 Lobar pneumonia, unspecified organism: Secondary | ICD-10-CM

## 2019-04-02 DIAGNOSIS — Z515 Encounter for palliative care: Secondary | ICD-10-CM

## 2019-04-02 DIAGNOSIS — Z7189 Other specified counseling: Secondary | ICD-10-CM

## 2019-04-02 DIAGNOSIS — N183 Chronic kidney disease, stage 3 (moderate): Secondary | ICD-10-CM

## 2019-04-02 DIAGNOSIS — R0902 Hypoxemia: Secondary | ICD-10-CM

## 2019-04-02 LAB — BASIC METABOLIC PANEL
Anion gap: 12 (ref 5–15)
BUN: 43 mg/dL — ABNORMAL HIGH (ref 8–23)
CO2: 22 mmol/L (ref 22–32)
Calcium: 9 mg/dL (ref 8.9–10.3)
Chloride: 106 mmol/L (ref 98–111)
Creatinine, Ser: 2.07 mg/dL — ABNORMAL HIGH (ref 0.61–1.24)
GFR calc Af Amer: 35 mL/min — ABNORMAL LOW (ref >60)
GFR calc non Af Amer: 30 mL/min — ABNORMAL LOW (ref >60)
Glucose, Bld: 143 mg/dL — ABNORMAL HIGH (ref 70–99)
Potassium: 4.5 mmol/L (ref 3.5–5.1)
Sodium: 140 mmol/L (ref 135–145)

## 2019-04-02 LAB — PROCALCITONIN: Procalcitonin: <0.1 ng/mL

## 2019-04-02 LAB — BRAIN NATRIURETIC PEPTIDE: B Natriuretic Peptide: 1723.5 pg/mL — ABNORMAL HIGH (ref 0.0–100.0)

## 2019-04-02 LAB — MAGNESIUM: Magnesium: 2.1 mg/dL (ref 1.7–2.4)

## 2019-04-02 LAB — HIV ANTIBODY (ROUTINE TESTING W REFLEX): HIV Screen 4th Generation wRfx: NONREACTIVE

## 2019-04-02 MED ORDER — HYDROCORTISONE 1 % EX CREA
1.0000 "application " | TOPICAL_CREAM | Freq: Three times a day (TID) | CUTANEOUS | Status: DC | PRN
  Administered 2019-04-02 – 2019-04-03 (×2): 1 via TOPICAL
  Filled 2019-04-02: qty 28

## 2019-04-02 MED ORDER — FUROSEMIDE 10 MG/ML IJ SOLN
40.0000 mg | Freq: Two times a day (BID) | INTRAMUSCULAR | Status: DC
  Administered 2019-04-02 – 2019-04-03 (×2): 40 mg via INTRAVENOUS
  Filled 2019-04-02 (×2): qty 4

## 2019-04-02 NOTE — Progress Notes (Signed)
PROGRESS NOTE    [name]  ONG:[id number] DOB: Jul 02, 1941 DOA: 04/01/2019 PCP: Prince Solian, MD    Brief Narrative:   [name] is a 78 y.o. 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  Has a rash with several pustules on his chest by ED to be related to zoster and started on valacyclovir, also was noted to have a tick on his neck which was removed with no evidence of rash at the present time, but more significantly complains of shortness of breath that has been present since discharge in May.  His shortness of breath has been worse recently.  Discharged from our facility after admission May 12 through eighth.  He underwent cardiac catheterization was found to have coronary disease suggesting need for CABG.  Was evaluated by Dr. Darcey Nora and felt not to be a candidate due to degree of medical illnesses.  His EF is 15 to 20% at that evaluation.  Since discharge from the hospital he has had continuing shortness of breath.  He saw the nephrologist who recommended stopping metformin but did not order it stopped referred him back to his primary but also stopped his BiDil.  He then saw his cardiology NP who recommended restarting the BiDil.  This has been very confusing for the patient and his wife I do not know which is the best approach to take regarding his medications.  1 week after discharge the patient did have an episode of diaphoresis and after that felt that his lungs were congested.  He feels that if he coughs very hard he is able to get some gray sputum but not much on a daily basis.  He has had no fevers, nausea, vomiting, change in appetite, body aches, or sweating.  He has had continued bilateral lower extremity edema that has been present since his last hospitalization and is unchanged.  He is concerned about orthopnea  and some PND.  He denies any chest pain.  Does not use any home oxygen or have a history of lung disease.  ED Course: Tick removed, medications ordered for suspected shingles, BNP markedly elevated 1814 (previously 465), creatinine 2.06 (stable) glucose 197 (average for him) potassium 4.8, chest x-ray shows left-sided pneumonia.  Paucity of symptoms associated with pneumonia therefore noncontrast CT scan of the chest was ordered.   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  Etiology likely acute on chronic congestive heart failure with volume overload versus community-acquired pneumonia.  Patient initially requiring 5 L of supplemental oxygen, at home is on room air.  Continue treatment of likely underlying pneumonia superimposed on congestive heart failure exacerbation.  COVID-19 test negative. --Continue to titrate supplemental oxygen to maintain SPO2  greater than 92%  Community-acquired pneumonia Chest x-ray with left greater than right pleural effusions and worsening airspace disease, infiltrate versus atelectasis.  CT chest with moderate bilateral pleural effusions, atelectasis and upper lobe airspace disease consistent with congestion versus pneumonia.  WBC 6.0, afebrile.  Lactic acid 1.3.  Pro-calcitonin less than 0.10.  COVID-19  test negative. --Continue antibiotics with azithromycin and ceftriaxone  Acute on chronic combined systolic/diastolic congestive heart failure BNP elevated 1814.  Echo from May 2020 with EF 65-03%, grade 3 diastolic dysfunction with diffuse hypokinesis.  Underwent heart catheterization 02/07/2019 with likely high-grade distal LM lesion.  Previously reviewed by cardiothoracic surgery for intervention, given his advanced age, frailty and renal disease not felt to be a CABG candidate and lesion not amenable to stenting. --Cardiology/advanced heart failure team following, appreciate assistance --Net negative 2.3 L past 24 hours --continue coreg 6.25mg  PO BID, Bidil and spironolactone --Lasix 40mg  IV BID --Losartan discontinued secondary to renal insufficiency --Fluid restriction 2L per day --Strict I's and O's --Daily weights  Shingles --Continue Valtrex 1000 mg p.o. daily  CKD stage IIIb Nephrology following, appreciate assistance.  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  Creatinine appears to be at baseline.  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 --Renally dose all medications, avoid nephrotoxins --Continue to monitor creatinine daily  CAD Heart catheterization Feb 07, 2019 with likely high-grade distal LM lesion.  Not amenable to stenting or coronary revascularization per cardiothoracic surgery. --Continue Coreg, aspirin, Plavix, statin  Hx CVA: Continue aspirin, Plavix, statin  Type 2 diabetes mellitus HbA1c 7.1 on 03/27/2016.  On metformin 1000 mg p.o. daily and glimepiride 0.5mg  p.o. twice daily at home. --Holding metformin --Continue glimepiride --Continue to monitor CBGs    DVT prophylaxis: Heparin Code Status: DNR Family Communication: none Disposition Plan: continue inpatient hospitalization   Consultants:   Cardiology/Acute Heart failure Team  Nephrology, Dr. Hollie Salk  Palliative care  Procedures:    none  Antimicrobials:   Azithromycin  Ceftriaxone  Valtrex   Subjective: Patient seen and examined at bedside this morning, resting comfortably.  Continues with some mild dyspnea, improving but not back to baseline.  Continues on supplemental oxygen, titrated now down to 3 L per nasal cannula.  Reports good urination with IV furosemide.  No other complaints at this time.  Denies headache, no fever/chills/night sweats, no nausea/vomiting/diarrhea, no chest pain, no palpitations, no abdominal pain, no issues with bowel/bladder function, no paresthesias, no cough/congestion.  No acute events overnight per nursing staff.  Objective: Vitals:   04/01/19 2250 04/02/19 0650 04/02/19 0701 04/02/19 0854  BP:  116/78  124/71  Pulse:  73  79  Resp:  18    Temp:  97.8 F (36.6 C)    TempSrc:  Oral    SpO2: 100% 99% 95%   Weight:  66.5 kg    Height:        Intake/Output Summary (Last 24 hours) at 04/02/2019 1549 Last data filed at 04/02/2019 0630 Gross per 24 hour  Intake 897.1 ml  Output 3265 ml  Net -2367.9 ml   Filed Weights   04/01/19 0800 04/01/19 1511 04/02/19 0650  Weight: 71.8 kg 69.3 kg 66.5 kg    Examination:  General exam: Appears calm and comfortable  Respiratory system: Breath sounds decreased bilateral bases with crackles, no wheezing, normal respiratory effort, no accessory muscle use, on 3 L nasal cannula Cardiovascular system: S1 & S2 heard, RRR. No JVD, murmurs, rubs, gallops or clicks.  1+ pitting edema bilateral lower extremities Gastrointestinal system: Abdomen is nondistended, soft and nontender. No organomegaly or masses felt. Normal bowel sounds heard. Central nervous system: Alert and oriented. No focal neurological deficits. Extremities: Symmetric 5 x 5 power. Skin: No rashes, lesions or ulcers Psychiatry: Judgement and insight appear normal. Mood & affect appropriate.     Data Reviewed: I have personally reviewed following labs and imaging  studies  CBC: Recent Labs  Lab 04/01/19 0854  WBC 6.0  NEUTROABS 4.0  HGB 11.4*  HCT 36.1*  MCV 95.5  PLT 409   Basic Metabolic Panel: Recent Labs  Lab 04/01/19 0854 04/02/19 0708  NA 139 140  K 4.8 4.5  CL 112* 106  CO2 19* 22  GLUCOSE 197* 143*  BUN 44* 43*  CREATININE 2.06* 2.07*  CALCIUM 8.9 9.0  MG  --  2.1   GFR: Estimated Creatinine Clearance: 29 mL/min (A) (by C-G formula based on SCr of 2.07 mg/dL (H)). Liver Function Tests: Recent Labs  Lab 04/01/19 0854  AST 18  ALT 24  ALKPHOS 65  BILITOT 0.6  PROT 6.0*  ALBUMIN 3.4*   No results for input(s): LIPASE, AMYLASE in the last 168 hours. No results for input(s): AMMONIA in the last 168 hours. Coagulation Profile: No results for input(s): INR, PROTIME in the last 168 hours. Cardiac Enzymes: No results for input(s): CKTOTAL, CKMB, CKMBINDEX, TROPONINI in the last 168 hours. BNP (last 3 results) No results for input(s): PROBNP in the last 8760 hours. HbA1C: No results for input(s): HGBA1C in the last 72 hours. CBG: No results for input(s): GLUCAP in the last 168 hours. Lipid Profile: No results for input(s): CHOL, HDL, LDLCALC, TRIG, CHOLHDL, LDLDIRECT in the last 72 hours. Thyroid Function Tests: No results for input(s): TSH, T4TOTAL, FREET4, T3FREE, THYROIDAB in the last 72 hours. Anemia Panel: No results for input(s): VITAMINB12, FOLATE, FERRITIN, TIBC, IRON, RETICCTPCT in the last 72 hours. Sepsis Labs: Recent Labs  Lab 04/01/19 0854 04/01/19 1546 04/02/19 0708  PROCALCITON  --  <0.10 <0.10  LATICACIDVEN 1.3  --   --     Recent Results (from the past 240 hour(s))  Blood culture (routine x 2)     Status: None (Preliminary result)   Collection Time: 04/01/19  8:45 AM   Specimen: BLOOD RIGHT FOREARM  Result Value Ref Range Status   Specimen Description BLOOD RIGHT FOREARM  Final   Special Requests   Final    BOTTLES DRAWN AEROBIC ONLY Blood Culture results may not be optimal due to an  inadequate volume of blood received in culture bottles   Culture   Final    NO GROWTH 1 DAY Performed at [facility], [place] [address], [place], [place] [address]    Report Status PENDING  Incomplete  Blood culture (routine x 2)     Status: None (Preliminary result)   Collection Time: 04/01/19  8:45 AM   Specimen: BLOOD  Result Value Ref Range Status   Specimen Description BLOOD LEFT ANTECUBITAL  Final   Special Requests   Final    BOTTLES DRAWN AEROBIC AND ANAEROBIC Blood Culture adequate volume   Culture   Final    NO GROWTH 1 DAY Performed at [facility], [place] [address], [place], [place] [address]    Report Status PENDING  Incomplete  SARS Coronavirus 2 (CEPHEID- Performed in [place] hospital lab), Hosp Order     Status: None   Collection Time: 04/01/19  9:12  AM   Specimen: Nasopharyngeal Swab  Result Value Ref Range Status   SARS Coronavirus 2 NEGATIVE NEGATIVE Final    Comment: (NOTE) If result is NEGATIVE SARS-CoV-2 target nucleic acids are NOT DETECTED. The SARS-CoV-2 RNA is generally detectable in upper and lower  respiratory specimens during the acute phase of infection. The lowest  concentration of SARS-CoV-2 viral copies this assay can detect is 250  copies / mL. A negative result does not preclude SARS-CoV-2 infection  and should not be used as the sole basis for treatment or other  patient management decisions.  A negative result may occur with  improper specimen collection / handling, submission of specimen other  than nasopharyngeal swab, presence of viral mutation(s) within the  areas targeted by this assay, and inadequate number of viral copies  (<250 copies / mL). A negative result must be combined with clinical  observations, patient history, and epidemiological information. If result is POSITIVE SARS-CoV-2 target nucleic acids are DETECTED. The SARS-CoV-2 RNA is generally detectable in upper and lower  respiratory specimens dur ing the  acute phase of infection.  Positive  results are indicative of active infection with SARS-CoV-2.  Clinical  correlation with patient history and other diagnostic information is  necessary to determine patient infection status.  Positive results do  not rule out bacterial infection or co-infection with other viruses. If result is PRESUMPTIVE POSTIVE SARS-CoV-2 nucleic acids MAY BE PRESENT.   A presumptive positive result was obtained on the submitted specimen  and confirmed on repeat testing.  While 2019 novel coronavirus  (SARS-CoV-2) nucleic acids may be present in the submitted sample  additional confirmatory testing may be necessary for epidemiological  and / or clinical management purposes  to differentiate between  SARS-CoV-2 and other Sarbecovirus currently known to infect humans.  If clinically indicated additional testing with an alternate test  methodology [id number]) is advised. The SARS-CoV-2 RNA is generally  detectable in upper and lower respiratory sp ecimens during the acute  phase of infection. The expected result is Negative. Fact Sheet for Patients:  StrictlyIdeas.no Fact Sheet for Healthcare Providers: BankingDealers.co.za This test is not yet approved or cleared by the [place] FDA and has been authorized for detection and/or diagnosis of SARS-CoV-2 by FDA under an Emergency Use Authorization (EUA).  This EUA will remain in effect (meaning this test can be used) for the duration of the COVID-19 declaration under Section 564(b)(1) of the Act, 21 U.S.C. section 360bbb-3(b)(1), unless the authorization is terminated or revoked sooner. Performed at [facility], [place] [address] [place], [place] [address]          Radiology Studies: Ct Chest Wo Contrast  Result Date: 04/01/2019 CLINICAL DATA:  Hypoxia, pneumonia versus CHF. Interstitial lung disease. EXAM: CT CHEST WITHOUT CONTRAST TECHNIQUE: Multidetector  CT imaging of the chest was performed following the standard protocol without IV contrast. COMPARISON:  02/05/2019 FINDINGS: Cardiovascular: Cardiomegaly without pericardial effusion. Extensive coronary calcification. There is also aortic atherosclerotic calcification. No acute vascular finding without contrast. Mediastinum/Nodes: Mild prominence of mediastinal lymph nodes that is likely congestive. No worrisome adenopathy. Negative esophagus. Lungs/Pleura: Moderate layering pleural effusions with patchy atelectasis. There is also ground-glass opacity in the primarily apically lungs. This same pattern was seen on 02/05/2019 chest CT, although the airspace opacity in the upper lobes is greater today. Inferior lingular segment was also collapsed on prior, but the central airways appear patent. Negative coronavirus in the chart today. Upper Abdomen: No acute abnormality. Musculoskeletal: No  acute or aggressive finding. Symmetric gynecomastia IMPRESSION: Moderate bilateral pleural effusion, atelectasis, and upper lobe airspace disease. This same pattern was seen on 02/05/2019 chest CT and may be congestive, although need correlation for pneumonia as the airspace opacities are patchy and asymmetric. Electronically Signed   By: Monte Fantasia M.D.   On: 04/01/2019 14:54   Dg Chest Portable 1 View  Result Date: 04/01/2019 CLINICAL DATA:  Shortness of breath and congestion for 2 months. EXAM: PORTABLE CHEST 1 VIEW COMPARISON:  CT chest 02/05/2019. Plain film of the chest 02/27/2016, 02/04/2019 and 02/08/2019. FINDINGS: Left worse than right layering pleural effusions and airspace disease have worsened since the most recent examination. Heart size is upper normal. Atherosclerosis noted. No pneumothorax. IMPRESSION: Left worse than right layering effusions and airspace disease which could be atelectasis or pneumonia worsened since the most recent examination. Atherosclerosis. Electronically Signed   By: Inge Rise  M.D.   On: 04/01/2019 08:13        Scheduled Meds:  aspirin EC  81 mg Oral Daily   azithromycin  500 mg Oral Daily   carvedilol  6.25 mg Oral BID   clopidogrel  75 mg Oral Daily   glimepiride  0.5 mg Oral BID WC   heparin  5,000 Units Subcutaneous Q8H   iron polysaccharides  150 mg Oral Q breakfast   simvastatin  40 mg Oral Daily   sodium chloride flush  3 mL Intravenous Q12H   valACYclovir  1,000 mg Oral Daily   Continuous Infusions:  sodium chloride     cefTRIAXone (ROCEPHIN)  IV Stopped (04/02/19 0116)     LOS: 1 day    Time spent: 31 minutes    Trystin Terhune J [place] DO [facility] Pager [phone]  If 7PM-7AM, please contact night-coverage [email or web address] Password [facility] 04/02/2019, 3:49 PM

## 2019-04-02 NOTE — Progress Notes (Signed)
[facility] Progress Note    Assessment/ Plan:   1.  CKD Stage G3b: At baseline creatinine.  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  Will check UP/C.  Watch renal function closely in the setting of having received vanc in ED, valacyclovir administration, and diuresis.  I agree with Lasix administration as per cardiology/AHF.  He is a poor candidate for dialysis given his severe cardiomyopathy.    2.  Acute on chronic combined systolic and diastolic CHF with EF 93-81%. On carvedilol 6.25 mg BID.  Got IV Lasix last night with robust urine output of 3L.  AHF consulted. No ARB/ ACEi d/t low eGFR.   3.  Shingles: on valacyclovir 1000 mg daily (appropriately renally dosed)  4.  Possible pneumonia: s/p vanc/ cefepime in ED, on azithro/ CTX for CAP now.  CT chest without contrast 7/7 with bilateral pleural effusions, atalectasis, and upper airspace disease  5.  CAD: not a CABG candidate.  On ASA and Plavix.       6.  Dispo: pending improvement  Subjective:    No acute events.  Diuresing well.  Sitting up on side of bed today applying lotion to legs.     Objective:   BP 124/71 (BP Location: Left Arm)   Pulse 79   Temp 97.8 F (36.6 C) (Oral)   Resp 18   Ht '5\' 10"'  (1.778 m)   Wt 66.5 kg   SpO2 95%   BMI 21.03 kg/m   Intake/Output Summary (Last 24 hours) at 04/02/2019 1349 Last data filed at 04/02/2019 0630 Gross per 24 hour  Intake 897.1 ml  Output 3265 ml  Net -2367.9 ml   Weight change:   Physical Exam: GEN NAD, sitting on side of bed HEENT EOMI PERRL NECK JVD slightly improved PULM normal WOB, wearing O2, bibasilar crackles and scattered upper lung field crackles too, still present. CV RRR ABD nondistended, nontender EXT 1+ LE edema, improved from yesterday distal extremities are cool NEURO AAO x 3  SKIN + multiple scabbed lesions on R flank, R and L neck, healed lesions R inner thigh and back of R knee   Imaging: Ct Chest Wo Contrast  Result Date:  04/01/2019 CLINICAL DATA:  Hypoxia, pneumonia versus CHF. Interstitial lung disease. EXAM: CT CHEST WITHOUT CONTRAST TECHNIQUE: Multidetector CT imaging of the chest was performed following the standard protocol without IV contrast. COMPARISON:  02/05/2019 FINDINGS: Cardiovascular: Cardiomegaly without pericardial effusion. Extensive coronary calcification. There is also aortic atherosclerotic calcification. No acute vascular finding without contrast. Mediastinum/Nodes: Mild prominence of mediastinal lymph nodes that is likely congestive. No worrisome adenopathy. Negative esophagus. Lungs/Pleura: Moderate layering pleural effusions with patchy atelectasis. There is also ground-glass opacity in the primarily apically lungs. This same pattern was seen on 02/05/2019 chest CT, although the airspace opacity in the upper lobes is greater today. Inferior lingular segment was also collapsed on prior, but the central airways appear patent. Negative coronavirus in the chart today. Upper Abdomen: No acute abnormality. Musculoskeletal: No acute or aggressive finding. Symmetric gynecomastia IMPRESSION: Moderate bilateral pleural effusion, atelectasis, and upper lobe airspace disease. This same pattern was seen on 02/05/2019 chest CT and may be congestive, although need correlation for pneumonia as the airspace opacities are patchy and asymmetric. Electronically Signed   By: Monte Fantasia M.D.   On: 04/01/2019 14:54   Dg Chest Portable 1 View  Result Date: 04/01/2019 CLINICAL DATA:  Shortness of breath and congestion for 2 months. EXAM: PORTABLE CHEST 1 VIEW COMPARISON:  CT chest  02/05/2019. Plain film of the chest 02/27/2016, 02/04/2019 and 02/08/2019. FINDINGS: Left worse than right layering pleural effusions and airspace disease have worsened since the most recent examination. Heart size is upper normal. Atherosclerosis noted. No pneumothorax. IMPRESSION: Left worse than right layering effusions and airspace disease which  could be atelectasis or pneumonia worsened since the most recent examination. Atherosclerosis. Electronically Signed   By: Inge Rise M.D.   On: 04/01/2019 08:13    Labs: BMET Recent Labs  Lab 04/01/19 0854 04/02/19 0708  NA 139 140  K 4.8 4.5  CL 112* 106  CO2 19* 22  GLUCOSE 197* 143*  BUN 44* 43*  CREATININE 2.06* 2.07*  CALCIUM 8.9 9.0   CBC Recent Labs  Lab 04/01/19 0854  WBC 6.0  NEUTROABS 4.0  HGB 11.4*  HCT 36.1*  MCV 95.5  PLT 214    Medications:    . aspirin EC  81 mg Oral Daily  . azithromycin  500 mg Oral Daily  . carvedilol  6.25 mg Oral BID  . clopidogrel  75 mg Oral Daily  . glimepiride  0.5 mg Oral BID WC  . heparin  5,000 Units Subcutaneous Q8H  . iron polysaccharides  150 mg Oral Q breakfast  . simvastatin  40 mg Oral Daily  . sodium chloride flush  3 mL Intravenous Q12H  . valACYclovir  1,000 mg Oral Daily     Madelon Lips MD [facility] pgr [phone] 04/02/2019, 1:49 PM

## 2019-04-02 NOTE — Progress Notes (Signed)
Advanced Heart Failure Rounding Note   Subjective:    He is diuresing well with IV lasix. Says his breathing is getting better. Still feels weak. Denies fevers or chills. No orthopnea or PND.   Weight down 6 pounds. Tmax 98.0. Renal function stable. PCT < 0.10   Objective:   Weight Range:  Vital Signs:   Temp:  [97.8 F (36.6 C)-98 F (36.7 C)] 97.9 F (36.6 C) (07/08 2032) Pulse Rate:  [71-79] 75 (07/08 2032) Resp:  [18] 18 (07/08 2032) BP: (106-128)/(64-78) 106/64 (07/08 2032) SpO2:  [95 %-100 %] 100 % (07/08 2032) Weight:  [66.5 kg] 66.5 kg (07/08 0650) Last BM Date: 03/30/19  Weight change: Filed Weights   04/01/19 0800 04/01/19 1511 04/02/19 0650  Weight: 71.8 kg 69.3 kg 66.5 kg    Intake/Output:   Intake/Output Summary (Last 24 hours) at 04/02/2019 2236 Last data filed at 04/02/2019 2139 Gross per 24 hour  Intake 168.5 ml  Output 2575 ml  Net -2406.5 ml     Physical Exam: General:  Elderly frail. No resp difficulty HEENT: normal Neck: supple. JVP 8-9 . Carotids 2+ bilat; no bruits. No lymphadenopathy or thryomegaly appreciated. Cor: PMI nondisplaced. Regular rate & rhythm. No rubs, gallops or murmurs. Lungs: mild crackles throughout  Abdomen: soft, nontender, nondistended. No hepatosplenomegaly. No bruits or masses. Good bowel sounds. Extremities: no cyanosis, clubbing, 1-2+ edema  + crusted over zoster lesions  Neuro: alert & orientedx3, cranial nerves grossly intact. moves all 4 extremities w/o difficulty. Affect pleasant  Telemetry: NSR 70s Personally reviewed   Labs: Basic Metabolic Panel: Recent Labs  Lab 04/01/19 0854 04/02/19 0708  NA 139 140  K 4.8 4.5  CL 112* 106  CO2 19* 22  GLUCOSE 197* 143*  BUN 44* 43*  CREATININE 2.06* 2.07*  CALCIUM 8.9 9.0  MG  --  2.1    Liver Function Tests: Recent Labs  Lab 04/01/19 0854  AST 18  ALT 24  ALKPHOS 65  BILITOT 0.6  PROT 6.0*  ALBUMIN 3.4*   No results for input(s): LIPASE,  AMYLASE in the last 168 hours. No results for input(s): AMMONIA in the last 168 hours.  CBC: Recent Labs  Lab 04/01/19 0854  WBC 6.0  NEUTROABS 4.0  HGB 11.4*  HCT 36.1*  MCV 95.5  PLT 214    Cardiac Enzymes: No results for input(s): CKTOTAL, CKMB, CKMBINDEX, TROPONINI in the last 168 hours.  BNP: BNP (last 3 results) Recent Labs    02/19/19 1023 04/01/19 0854 04/02/19 0708  BNP 465.4* 1,814.5* 1,723.5*    ProBNP (last 3 results) No results for input(s): PROBNP in the last 8760 hours.    Other results:  Imaging: Ct Chest Wo Contrast  Result Date: 04/01/2019 CLINICAL DATA:  Hypoxia, pneumonia versus CHF. Interstitial lung disease. EXAM: CT CHEST WITHOUT CONTRAST TECHNIQUE: Multidetector CT imaging of the chest was performed following the standard protocol without IV contrast. COMPARISON:  02/05/2019 FINDINGS: Cardiovascular: Cardiomegaly without pericardial effusion. Extensive coronary calcification. There is also aortic atherosclerotic calcification. No acute vascular finding without contrast. Mediastinum/Nodes: Mild prominence of mediastinal lymph nodes that is likely congestive. No worrisome adenopathy. Negative esophagus. Lungs/Pleura: Moderate layering pleural effusions with patchy atelectasis. There is also ground-glass opacity in the primarily apically lungs. This same pattern was seen on 02/05/2019 chest CT, although the airspace opacity in the upper lobes is greater today. Inferior lingular segment was also collapsed on prior, but the central airways appear patent. Negative coronavirus in the  chart today. Upper Abdomen: No acute abnormality. Musculoskeletal: No acute or aggressive finding. Symmetric gynecomastia IMPRESSION: Moderate bilateral pleural effusion, atelectasis, and upper lobe airspace disease. This same pattern was seen on 02/05/2019 chest CT and may be congestive, although need correlation for pneumonia as the airspace opacities are patchy and asymmetric.  Electronically Signed   By: Monte Fantasia M.D.   On: 04/01/2019 14:54   Dg Chest Portable 1 View  Result Date: 04/01/2019 CLINICAL DATA:  Shortness of breath and congestion for 2 months. EXAM: PORTABLE CHEST 1 VIEW COMPARISON:  CT chest 02/05/2019. Plain film of the chest 02/27/2016, 02/04/2019 and 02/08/2019. FINDINGS: Left worse than right layering pleural effusions and airspace disease have worsened since the most recent examination. Heart size is upper normal. Atherosclerosis noted. No pneumothorax. IMPRESSION: Left worse than right layering effusions and airspace disease which could be atelectasis or pneumonia worsened since the most recent examination. Atherosclerosis. Electronically Signed   By: Inge Rise M.D.   On: 04/01/2019 08:13      Medications:     Scheduled Medications:  aspirin EC  81 mg Oral Daily   azithromycin  500 mg Oral Daily   carvedilol  6.25 mg Oral BID   clopidogrel  75 mg Oral Daily   furosemide  40 mg Intravenous BID   glimepiride  0.5 mg Oral BID WC   heparin  5,000 Units Subcutaneous Q8H   iron polysaccharides  150 mg Oral Q breakfast   simvastatin  40 mg Oral Daily   sodium chloride flush  3 mL Intravenous Q12H   valACYclovir  1,000 mg Oral Daily     Infusions:  sodium chloride     cefTRIAXone (ROCEPHIN)  IV Stopped (04/02/19 0116)     PRN Medications:  sodium chloride, acetaminophen, hydrocortisone cream, ondansetron (ZOFRAN) IV, oxyCODONE-acetaminophen, sodium chloride flush, zolpidem   Assessment/Plan:   1.  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 Would continue to diurese with IV lasix for at least another day - Repeat CXR - Continue carvedilol. Restart other HF meds as  tolerated  2.  Shingles with possible zoster pneumonia - Started on antibiotics for CAP. - Blood cultures, procalcitonin and HIV antibody negative  3.  CKD 3-4 - Renal function stable with creatinine ~ 2.0 - Nephrology following as well  4. CAD - severe 3v CAD - no current s/s of ischemia. Continue DAPT and statin  5. Acute hypoxic respiratory failure. - as above. Suspect primarily due to HF.  - repeat CXR    Length of Stay: 1   Glori Bickers, MD 04/02/2019, 10:36 PM  Advanced Heart Failure Team Pager [phone] (M-F; 7a - 4p)  Please contact [facility] for night-coverage after hours (4p -7a ) and weekends on [email or web address]

## 2019-04-02 NOTE — Consult Note (Signed)
Consultation Note Date: 04/02/2019   Patient Name: [name]  DOB: 09/20/1941  MRN: [id number]  Age / Sex: 78 y.o., male   PCP: Peter Solian, MD Referring Physician: [place] Eric J, DO   REASON FOR CONSULTATION:Establishing goals of care  Palliative Care consult requested for this 78 y.o. 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 LBBB, diabetes, atrial fibrillation, CVA (2015), and pneumonia. He presented to ED from home with multiple concerns. He has a chest rash with pustules and ED provider also found a tick on his neck which was removed. On assessment pustules thought to be zoster and he was initiated on valacyclovir. He also complains of worsening shortness of breath. Patient recently discharged 01/31/2019 due to shortness of breath. He underwent a cardiac cath which indicated the need for CABG due to significant coronary disease. Per Dr. Lawson [name] patient deemed not a candidate for surgery due to multiple co-morbidities. Chest x-ray showed left-sided pneumonia on exam. BNP was elevated 1814 (previous admission 465).  Patient requiring 5L/[place] for oxygen support. PMT to assist with goals of care discussion.   Clinical Assessment and Goals of Care: I have reviewed medical records including lab results, imaging, Epic notes, and MAR, received report from the bedside RN, and assessed the patient. I met at the bedside with patient  to discuss diagnosis prognosis, GOC, EOL wishes, disposition and options. Patient awake, alert, oriented x3. He is sitting up on the side of the bed. Denies shortness of breath. States areas of shingles are sore to touch and itch at times.   I introduced Palliative Medicine as specialized medical care for people living with serious illness. It focuses on providing relief from the symptoms and stress of a serious illness.  The goal is to improve quality of life for both the patient and the family. Patient verbalized understanding and appreciation.   We discussed a brief life review of the patient, along with his functional and nutritional status. He states he has been married to his wife for 29 years. They have no children together however, he has 3 daughters from a previous marriage. 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 His wife is a Scientist, water quality at [employer]. He is 1 of 11 siblings (with only 3 surviving).   He reports prior to admission he was independent in his care. He uses a cane on occassions for gait stability. 2 years ago he had a lawn mowing incident and stepped on a nail which required all 5 toes on his right foot to be amputated due to infection and circulation. He states his appetite has decreased since this surgery and he has lost 97lbs in the coarse of 2 years.   We discussed His current illness and what it means in the larger context of His on-going co-morbidities. With specific discussions regarding his CHF, CKD, respiratory failure, and overall functional and nutritional state. Natural disease trajectory and expectations at EOL were discussed. Patient verbalized understanding of current illness and co-morbidities. We discuss his CAD and worsening CHF. He understands he is not a candidate for surgical interventions. We also discussed his CKD. Patient verbalized understanding and appreciation for discussion.  I attempted to elicit values and goals of care important to the patient.    The difference between aggressive medical intervention and comfort care was considered in light of the patient's goals of care [name] states he would like to continue the treatable if possible. He  understands that his health will continue to decline, require hospitalizations, and eventually result in an EOL situation. He states he would like to continue to live if possible "but if the good Reita Cliche decides my time is up, then I accept that and everyone else will have to also! I want to live, but I don't want to be miserable, and definitely no machines!" Given his statement I further discussed his wishes regarding machines with focus on dialysis and life-sustaining measures. He confirms his wishes for DNR/DNI. He shares his mother had similar health issues and he recalls her being given the option for peritoneal dialysis which she attempted for 1 month and was miserable. He shares she declined HD dialysis and he cared for her in the hospital and remained at her bedside with hospice until she passed away peacefully. He shares "if and when I get to that point, I can't gamble my life if I am going to feel bad every other day and still die. I would rather make my decision when it is time to prepare to die." Support given. He states he would not be interested in any forms of dialysis if required.   I address any discomforts or symptoms patient may currently have. He states he would not any medications changed at this time. He feels that his current pain regimen is controlling his shingle pain and discomfort.   We discuss the use of oxygen and the possibility of home oxygen given his cardiac disease and shortness of breath. He states he will consider home oxygen use for peace of mind and comfort.   Patient confirms his wife, [name] is his HCPOA.   Hospice and Palliative Care services outpatient were explained and offered. Patient verbalized his understanding, previous experiences with hospice, and awareness of both palliative and hospice's goals and philosophy of care. At this time he states he is willing to have outpatient palliative as a support once he discharges home, with awareness he may transition  to hospice at any time.   Questions and concerns were addressed.  Patient was encouraged to call with questions or concerns.  PMT will continue to support holistically.   SOCIAL HISTORY:     reports that he has quit smoking. His smoking use included cigarettes. He has a 5.00 pack-year smoking history. He has never used smokeless tobacco. He reports current alcohol use. He reports that he does not use drugs.  CODE STATUS: DNR/DNI  ADVANCE DIRECTIVES: NEXT OF KIN    SYMPTOM MANAGEMENT: per attending   Palliative Prophylaxis:   Bowel Regimen, Frequent Pain Assessment and Palliative Wound Care  PSYCHO-SOCIAL/SPIRITUAL:  Support System: Family   Desire for further Chaplaincy support:NO   Additional Recommendations (Limitations, Scope, Preferences):  Full Scope Treatment and No Hemodialysis   PAST MEDICAL HISTORY: Past Medical History:  Diagnosis Date  . Allergy   . Atrial fibrillation ([place])   . Cataract   . Chronic kidney disease    Renal Insuffiecny  . Colon polyps 2012   Colonoscopy  . Diabetes mellitus    type 2  . Diverticulosis 2012   Colonoscopy   . Hyperlipidemia   . Hypertension   . LBBB (left bundle branch block)   .  Pneumonia 2015   3 times  . Stroke [facility]) 2015    PAST SURGICAL HISTORY:  Past Surgical History:  Procedure Laterality Date  . AMPUTATION Right 04/28/2016   Procedure: Right Transmetatarsal Amputation;  Surgeon: Newt Minion, MD;  Location: [place];  Service: Orthopedics;  Laterality: Right;  . CATARACT EXTRACTION Bilateral    both eyes  . COLON SURGERY     to repair intestines as child  . COLONOSCOPY  10/20/2011   Procedure: COLONOSCOPY;  Surgeon: Inda Castle, MD;  Location: [facility];  Service: Endoscopy;  Laterality: N/A;  . FEMORAL-POPLITEAL BYPASS GRAFT Right 04/07/2016   Procedure: BYPASS GRAFT FEMORAL-POPLITEAL ARTERY-RIGHT;  Surgeon: Angelia Mould, MD;  Location: [facility];  Service: Vascular;  Laterality: Right;  . HOT  HEMOSTASIS  10/20/2011   Procedure: HOT HEMOSTASIS (ARGON PLASMA COAGULATION/BICAP);  Surgeon: Inda Castle, MD;  Location: [facility];  Service: Endoscopy;  Laterality: N/A;  . KNEE ARTHROSCOPY Left    left  . NECK SURGERY     disectomy  . PERIPHERAL VASCULAR CATHETERIZATION Right 04/05/2016   Procedure: Lower Extremity Angiography;  Surgeon: Rosetta Posner, MD;  Location: [place] [facility];  Service: Cardiovascular;  Laterality: Right;  . PERIPHERAL VASCULAR CATHETERIZATION N/A 04/05/2016   Procedure: Abdominal Aortogram;  Surgeon: Rosetta Posner, MD;  Location: [place] [facility];  Service: Cardiovascular;  Laterality: N/A;  . RIGHT/LEFT HEART CATH AND CORONARY ANGIOGRAPHY N/A 02/07/2019   Procedure: RIGHT/LEFT HEART CATH AND CORONARY ANGIOGRAPHY;  Surgeon: Jolaine Artist, MD;  Location: [place] [facility];  Service: Cardiovascular;  Laterality: N/A;  . VEIN HARVEST Right 04/07/2016   Procedure: VEIN HARVEST GREATER SAPHENOUS VIEN ;  Surgeon: Angelia Mould, MD;  Location: [place];  Service: Vascular;  Laterality: Right;    ALLERGIES:  has No Known Allergies.   MEDICATIONS:  Current Facility-Administered Medications  Medication Dose Route Frequency Provider Last Rate Last Dose  . 0.9 %  sodium chloride infusion  250 mL Intravenous PRN Lady Deutscher, MD      . acetaminophen (TYLENOL) tablet 650 mg  650 mg Oral Q4H PRN Lady Deutscher, MD   650 mg at 04/01/19 1716  . aspirin EC tablet 81 mg  81 mg Oral Daily Lady Deutscher, MD   81 mg at 04/02/19 0846  . azithromycin (ZITHROMAX) tablet 500 mg  500 mg Oral Daily Lady Deutscher, MD   500 mg at 04/02/19 0846  . carvedilol (COREG) tablet 6.25 mg  6.25 mg Oral BID Lady Deutscher, MD   6.25 mg at 04/02/19 0845  . cefTRIAXone (ROCEPHIN) 2 g in sodium chloride 0.9 % 100 mL IVPB  2 g Intravenous Q24H Lady Deutscher, MD   Stopped at 04/02/19 0116  . clopidogrel (PLAVIX) tablet 75 mg  75 mg Oral Daily Lady Deutscher, MD   75 mg at 04/02/19 0847  . furosemide (LASIX) injection 40 mg  40 mg Intravenous BID [place] Donnamarie Poag, DO      . glimepiride (AMARYL) tablet 0.5 mg  0.5 mg Oral BID WC Lady Deutscher, MD   0.5 mg at 04/02/19 0847  . heparin injection 5,000 Units  5,000 Units Subcutaneous Q8H Lady Deutscher, MD   5,000 Units at 04/02/19 1556  . iron polysaccharides (NIFEREX) capsule 150 mg  150 mg Oral Q breakfast Lady Deutscher, MD   150 mg at 04/02/19 0847  . ondansetron (ZOFRAN) injection 4 mg  4 mg Intravenous Q6H PRN Lady Deutscher, MD      . oxyCODONE-acetaminophen (PERCOCET/ROXICET) 5-325 MG per tablet 1-2 tablet  1-2 tablet Oral Q4H PRN Schorr, Rhetta Mura, NP   2 tablet at 04/01/19 2234  . simvastatin (ZOCOR) tablet 40 mg  40 mg Oral Daily Lady Deutscher, MD   40 mg at 04/02/19 0847  . sodium chloride flush (NS) 0.9 % injection 3 mL  3 mL Intravenous Q12H Lady Deutscher, MD   3 mL at 04/02/19 1556  . sodium chloride flush (NS) 0.9 % injection 3 mL  3 mL Intravenous PRN Lady Deutscher, MD   3 mL at 04/01/19 2117  . valACYclovir (VALTREX) tablet 1,000 mg  1,000 mg Oral Daily Lady Deutscher, MD   1,000 mg at 04/02/19 0846  . zolpidem (AMBIEN) tablet 5 mg  5 mg Oral QHS PRN Lady Deutscher, MD        VITAL SIGNS: BP 128/69 (BP Location: Left Arm)   Pulse 71   Temp 98 F (36.7 C) (Oral)   Resp 18   Ht '5\' 10"'$  (1.778 m)   Wt 66.5 kg   SpO2 100%   BMI 21.03 kg/m  Filed Weights   04/01/19 0800 04/01/19 1511 04/02/19 0650  Weight: 71.8 kg 69.3 kg 66.5 kg    Estimated body mass index is 21.03 kg/m as calculated from the following:   Height as of this encounter: '5\' 10"'$  (1.778 m).   Weight as of this encounter: 66.5 kg.  LABS: CBC:    Component Value Date/Time   WBC 6.0 04/01/2019 0854   HGB 11.4 (L) 04/01/2019 0854   HCT 36.1 (L) 04/01/2019 0854   PLT 214 04/01/2019 0854   Comprehensive Metabolic Panel:    Component Value Date/Time   NA 140 04/02/2019  0708   K 4.5 04/02/2019 0708   CO2 22 04/02/2019 0708   BUN 43 (H) 04/02/2019 0708   CREATININE 2.07 (H) 04/02/2019 0708   CREATININE 1.57 (H) 03/20/2016 1102   ALBUMIN 3.4 (L) 04/01/2019 0854     Review of Systems  Respiratory: Positive for shortness of breath.   Musculoskeletal: Positive for arthralgias.  Neurological: Positive for weakness.   Physical Exam General: NAD, thin, chronically-ill appearing Cardiovascular: regular rate and rhythm, S1S2 Pulmonary: bilateral crackled, diminished bases Abdomen: soft, nontender, + bowel sounds Extremities: bilateral lower extremity edema, right foot all toes amputated Skin: pustules/irritation to neck and right flank area  Neurological: Weakness, awake, A&O x3   Prognosis: Guarded to Poor in the setting of acute respiratory failure with hypoxia, CAP, CKD stage III, combined CHF, EF 15-20%, diabetes, shingles, CAD (not a surgical candidate), CVA.   Discharge Planning:  To Be Determined  Recommendations:  DNR/DNI-as requested by patient  Continue with current plan of care per medical team  Patient remains hopeful for some stability but also prepared for the worst (further decline).   Does not want HD if required, no artifical feedings  Feels pain is effectively being managed with current regimen (Percocet/Tylenol). Could consider Neurontin and or topical numbing agents if pain persist and to prevent opioid dependency in the future.   Outpatient palliative at discharge.   PMT will continue to support and follow as needed.   Palliative Performance Scale:PPS 50%              Patient  expressed understanding and was in agreement with this plan.   Thank you for allowing the Palliative Medicine Team  to assist in the care of this patient.  Time In: 1330 Time Out: 1445 Time Total: 75 min.   Visit consisted of counseling and education dealing with the complex and emotionally intense issues of symptom management and palliative  care in the setting of serious and potentially life-threatening illness.Greater than 50%  of this time was spent counseling and coordinating care related to the above assessment and plan.  Signed by:  Alda Lea, AGPCNP-BC Palliative Medicine Team  Phone: [phone] Fax: [phone] Pager: [phone] Amion: Bjorn Pippin

## 2019-04-03 ENCOUNTER — Inpatient Hospital Stay (HOSPITAL_COMMUNITY): Payer: Medicare HMO

## 2019-04-03 ENCOUNTER — Telehealth: Payer: Self-pay | Admitting: Cardiovascular Disease

## 2019-04-03 ENCOUNTER — Encounter: Payer: Self-pay | Admitting: Cardiovascular Disease

## 2019-04-03 LAB — BASIC METABOLIC PANEL
Anion gap: 10 (ref 5–15)
BUN: 45 mg/dL — ABNORMAL HIGH (ref 8–23)
CO2: 22 mmol/L (ref 22–32)
Calcium: 8.2 mg/dL — ABNORMAL LOW (ref 8.9–10.3)
Chloride: 102 mmol/L (ref 98–111)
Creatinine, Ser: 2.12 mg/dL — ABNORMAL HIGH (ref 0.61–1.24)
GFR calc Af Amer: 34 mL/min — ABNORMAL LOW (ref >60)
GFR calc non Af Amer: 30 mL/min — ABNORMAL LOW (ref >60)
Glucose, Bld: 191 mg/dL — ABNORMAL HIGH (ref 70–99)
Potassium: 4.2 mmol/L (ref 3.5–5.1)
Sodium: 134 mmol/L — ABNORMAL LOW (ref 135–145)

## 2019-04-03 LAB — GLUCOSE, CAPILLARY
Glucose-Capillary: 155 mg/dL — ABNORMAL HIGH (ref 70–99)
Glucose-Capillary: 234 mg/dL — ABNORMAL HIGH (ref 70–99)
Glucose-Capillary: 237 mg/dL — ABNORMAL HIGH (ref 70–99)
Glucose-Capillary: 116 mg/dL — ABNORMAL HIGH (ref 70–99)

## 2019-04-03 LAB — PROCALCITONIN: Procalcitonin: <0.1 ng/mL

## 2019-04-03 MED ORDER — INSULIN ASPART 100 UNIT/ML ~~LOC~~ SOLN
0.0000 [IU] | Freq: Three times a day (TID) | SUBCUTANEOUS | Status: DC
  Administered 2019-04-03 – 2019-04-04 (×2): 3 [IU] via SUBCUTANEOUS

## 2019-04-03 MED ORDER — SPIRONOLACTONE 12.5 MG HALF TABLET
12.5000 mg | ORAL_TABLET | Freq: Every day | ORAL | Status: DC
  Administered 2019-04-04: 12.5 mg via ORAL
  Filled 2019-04-03: qty 1

## 2019-04-03 MED ORDER — ISOSORB DINITRATE-HYDRALAZINE 20-37.5 MG PO TABS
1.0000 | ORAL_TABLET | Freq: Three times a day (TID) | ORAL | Status: DC
  Administered 2019-04-03 – 2019-04-04 (×2): 1 via ORAL
  Filled 2019-04-03 (×2): qty 1

## 2019-04-03 MED ORDER — FUROSEMIDE 80 MG PO TABS
80.0000 mg | ORAL_TABLET | Freq: Every day | ORAL | Status: DC
  Administered 2019-04-04: 80 mg via ORAL
  Filled 2019-04-03: qty 1

## 2019-04-03 MED ORDER — INSULIN ASPART 100 UNIT/ML ~~LOC~~ SOLN: 0.0000 [IU] | Freq: Every day | SUBCUTANEOUS | Status: DC

## 2019-04-03 NOTE — Progress Notes (Signed)
OT Cancellation Note  Patient Details Name: [name] MRN: [id number] DOB: 03-15-1941   Cancelled Treatment:    Reason Eval/Treat Not Completed: OT screened, no needs identified, will sign off. Per conversation with PT, pt is at/close to baseline with ADLs. Will screen out.   Tyrone Schimke, OT Acute Rehabilitation Services Pager: [phone] Office: [phone]  04/03/2019, 11:06 AM

## 2019-04-03 NOTE — Progress Notes (Signed)
Spoke with Dr. [place] he stated he does not think patient has shingles.  New order received to dc airborne and contact isolation.

## 2019-04-03 NOTE — Progress Notes (Addendum)
PROGRESS NOTE    [name]  GMW:[id number] DOB: 16-Jan-1941 DOA: 04/01/2019 PCP: Prince Solian, MD    Brief Narrative:   [name] is a 78 y.o. 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  Has a rash with several pustules on his chest by ED to be related to zoster and started on valacyclovir, also was noted to have a tick on his neck which was removed with no evidence of rash at the present time, but more significantly complains of shortness of breath that has been present since discharge in May.  His shortness of breath has been worse recently.  Discharged from our facility after admission May 12 through eighth.  He underwent cardiac catheterization was found to have coronary disease suggesting need for CABG.  Was evaluated by Dr. Darcey Nora and felt not to be a candidate due to degree of medical illnesses.  His EF is 15 to 20% at that evaluation.  Since discharge from the hospital he has had continuing shortness of breath.  He saw the nephrologist who recommended stopping metformin but did not order it stopped referred him back to his primary but also stopped his BiDil.  He then saw his cardiology NP who recommended restarting the BiDil.  This has been very confusing for the patient and his wife I do not know which is the best approach to take regarding his medications.  1 week after discharge the patient did have an episode of diaphoresis and after that felt that his lungs were congested.  He feels that if he coughs very hard he is able to get some gray sputum but not much on a daily basis.  He has had no fevers, nausea, vomiting, change in appetite, body aches, or sweating.  He has had continued bilateral lower extremity edema that has been present since his last hospitalization and is unchanged.  He is concerned about orthopnea  and some PND.  He denies any chest pain.  Does not use any home oxygen or have a history of lung disease.  ED Course: Tick removed, medications ordered for suspected shingles, BNP markedly elevated 1814 (previously 465), creatinine 2.06 (stable) glucose 197 (average for him) potassium 4.8, chest x-ray shows left-sided pneumonia.  Paucity of symptoms associated with pneumonia therefore noncontrast CT scan of the chest was ordered.   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  Etiology likely acute on chronic congestive heart failure with volume overload versus community-acquired pneumonia.  Patient initially requiring 5 L of supplemental oxygen, at home is on room air.  Continue treatment of likely underlying pneumonia superimposed on congestive heart failure exacerbation.  COVID-19 test negative. --Continue to titrate supplemental oxygen to maintain SPO2  greater than 92% --Continue to assess daily oxygen status on room air  Community-acquired pneumonia Chest x-ray with left greater than right pleural effusions and worsening airspace disease, infiltrate versus atelectasis.  CT chest with moderate bilateral pleural effusions, atelectasis and upper lobe airspace disease consistent with congestion versus pneumonia.  WBC 6.0, afebrile.  Lactic  acid 1.3.  Pro-calcitonin less than 0.10.  COVID-19 test negative. --Continue antibiotics with azithromycin and ceftriaxone  Acute on chronic combined systolic/diastolic congestive heart failure BNP elevated 1814.  Echo from May 2020 with EF 96-29%, grade 3 diastolic dysfunction with diffuse hypokinesis.  Underwent heart catheterization 02/07/2019 with likely high-grade distal LM lesion.  Previously reviewed by cardiothoracic surgery for intervention, given his advanced age, frailty and renal disease not felt to be a CABG candidate and lesion not amenable to stenting. --Cardiology/advanced heart failure team following, appreciate assistance --Net negative 1.3 L past 24 hours --continue coreg 6.25mg  PO BID, Bidil and spironolactone --Lasix 40mg  IV BID --Losartan discontinued secondary to renal insufficiency --Fluid restriction 2L per day --Strict I's and O's --Daily weights, wt down to 66.4kg today  Questionable shingles Patient with reported shingles by admitting physician.  On physical exam, regions of concern on lower flank not consistent with active shingles infection.  Single lesion noted, no crusting or blister formation; likely related to a bug bite. --Discontinue airborne/contact isolation precautions --Continue Valtrex 1000 mg p.o. daily; plan 7-day course  CKD stage IIIb Nephrology following, appreciate assistance.  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  Creatinine appears to be at baseline.  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 --Renally dose all medications, avoid nephrotoxins --Continue to monitor creatinine daily  CAD Heart catheterization Feb 07, 2019 with likely high-grade distal LM lesion.  Not amenable to stenting or coronary revascularization per cardiothoracic surgery. --Continue Coreg, aspirin, Plavix, statin  Hx CVA: Continue aspirin, Plavix, statin  Type 2 diabetes mellitus HbA1c 7.1 on 03/27/2016.  On  metformin 1000 mg p.o. daily and glimepiride 0.5mg  p.o. twice daily at home. --Holding metformin --Continue glimepiride --ISS for coverage --update Hb A1C --Continue to monitor CBGs    DVT prophylaxis: Heparin Code Status: DNR Family Communication: none Disposition Plan: continue inpatient hospitalization   Consultants:   Cardiology/Acute Heart failure Team  Nephrology, Dr. Hollie Salk  Palliative care  Procedures:   none  Antimicrobials:   Azithromycin  Ceftriaxone  Valtrex   Subjective: Patient seen and examined at bedside this morning, resting comfortably.  Continues with some mild dyspnea, improving but not back to baseline.  Continues on supplemental oxygen, titrated now down to 2 L per nasal cannula.  Reports good urination with IV furosemide.  No other complaints at this time.  Denies headache, no fever/chills/night sweats, no nausea/vomiting/diarrhea, no chest pain, no palpitations, no abdominal pain, no issues with bowel/bladder function, no paresthesias, no cough/congestion.  No acute events overnight per nursing staff.  Objective: Vitals:   04/02/19 2200 04/03/19 0525 04/03/19 0531 04/03/19 0537  BP:  132/75    Pulse:  74    Resp:  16    Temp:  97.6 F (36.4 C)    TempSrc:  Oral    SpO2: 100% 100%  96%  Weight:   66.4 kg   Height:        Intake/Output Summary (Last 24 hours) at 04/03/2019 1301 Last data filed at 04/03/2019 0900 Gross per 24 hour  Intake 503 ml  Output 1500 ml  Net -997 ml   [employer]  04/01/19 1511 04/02/19 0650 04/03/19 0531  Weight: 69.3 kg 66.5 kg 66.4 kg    Examination:  General exam: Appears calm and comfortable  Respiratory system: Breath sounds decreased bilateral bases with crackles, no wheezing, normal respiratory effort, no accessory muscle use, on 3 L nasal cannula Cardiovascular system: S1 & S2 heard, RRR. No JVD, murmurs, rubs, gallops or clicks.  1+ pitting edema bilateral lower extremities Gastrointestinal  system: Abdomen is nondistended, soft and nontender. No organomegaly or masses felt. Normal bowel sounds heard. Central nervous system: Alert and oriented. No focal neurological deficits. Extremities: Symmetric 5 x 5 power. Skin: No rashes, lesions or ulcers Psychiatry: Judgement and insight appear normal. Mood & affect appropriate.     Data Reviewed: I have personally reviewed following labs and imaging studies  CBC: Recent Labs  Lab 04/01/19 0854  WBC 6.0  NEUTROABS 4.0  HGB 11.4*  HCT 36.1*  MCV 95.5  PLT 626   Basic Metabolic Panel: Recent Labs  Lab 04/01/19 0854 04/02/19 0708 04/03/19 0447  NA 139 140 134*  K 4.8 4.5 4.2  CL 112* 106 102  CO2 19* 22 22  GLUCOSE 197* 143* 191*  BUN 44* 43* 45*  CREATININE 2.06* 2.07* 2.12*  CALCIUM 8.9 9.0 8.2*  MG  --  2.1  --    GFR: Estimated Creatinine Clearance: 28.3 mL/min (A) (by C-G formula based on SCr of 2.12 mg/dL (H)). Liver Function Tests: Recent Labs  Lab 04/01/19 0854  AST 18  ALT 24  ALKPHOS 65  BILITOT 0.6  PROT 6.0*  ALBUMIN 3.4*   No results for input(s): LIPASE, AMYLASE in the last 168 hours. No results for input(s): AMMONIA in the last 168 hours. Coagulation Profile: No results for input(s): INR, PROTIME in the last 168 hours. Cardiac Enzymes: No results for input(s): CKTOTAL, CKMB, CKMBINDEX, TROPONINI in the last 168 hours. BNP (last 3 results) No results for input(s): PROBNP in the last 8760 hours. HbA1C: No results for input(s): HGBA1C in the last 72 hours. CBG: Recent Labs  Lab 04/03/19 0806 04/03/19 1108  GLUCAP 155* 234*   Lipid Profile: No results for input(s): CHOL, HDL, LDLCALC, TRIG, CHOLHDL, LDLDIRECT in the last 72 hours. Thyroid Function Tests: No results for input(s): TSH, T4TOTAL, FREET4, T3FREE, THYROIDAB in the last 72 hours. Anemia Panel: No results for input(s): VITAMINB12, FOLATE, FERRITIN, TIBC, IRON, RETICCTPCT in the last 72 hours. Sepsis Labs: Recent Labs  Lab  04/01/19 0854 04/01/19 1546 04/02/19 0708 04/03/19 0447  PROCALCITON  --  <0.10 <0.10 <0.10  LATICACIDVEN 1.3  --   --   --     Recent Results (from the past 240 hour(s))  Blood culture (routine x 2)     Status: None (Preliminary result)   Collection Time: 04/01/19  8:45 AM   Specimen: BLOOD RIGHT FOREARM  Result Value Ref Range Status   Specimen Description BLOOD RIGHT FOREARM  Final   Special Requests   Final    BOTTLES DRAWN AEROBIC ONLY Blood Culture results may not be optimal due to an inadequate volume of blood received in culture bottles   Culture   Final    NO GROWTH 2 DAYS Performed at [facility], [place] [address] [place], [place] [address]    Report Status PENDING  Incomplete  Blood culture (routine x 2)     Status: None (Preliminary result)   Collection Time: 04/01/19  8:45 AM   Specimen: BLOOD  Result Value Ref Range Status  Specimen Description BLOOD LEFT ANTECUBITAL  Final   Special Requests   Final    BOTTLES DRAWN AEROBIC AND ANAEROBIC Blood Culture adequate volume   Culture   Final    NO GROWTH 2 DAYS Performed at [facility], [address] [place], [place] [address]    Report Status PENDING  Incomplete  SARS Coronavirus 2 (CEPHEID- Performed in [place] hospital lab), Hosp Order     Status: None   Collection Time: 04/01/19  9:12 AM   Specimen: Nasopharyngeal Swab  Result Value Ref Range Status   SARS Coronavirus 2 NEGATIVE NEGATIVE Final    Comment: (NOTE) If result is NEGATIVE SARS-CoV-2 target nucleic acids are NOT DETECTED. The SARS-CoV-2 RNA is generally detectable in upper and lower  respiratory specimens during the acute phase of infection. The lowest  concentration of SARS-CoV-2 viral copies this assay can detect is 250  copies / mL. A negative result does not preclude SARS-CoV-2 infection  and should not be used as the sole basis for treatment or other  patient management decisions.  A negative result may occur with    improper specimen collection / handling, submission of specimen other  than nasopharyngeal swab, presence of viral mutation(s) within the  areas targeted by this assay, and inadequate number of viral copies  (<250 copies / mL). A negative result must be combined with clinical  observations, patient history, and epidemiological information. If result is POSITIVE SARS-CoV-2 target nucleic acids are DETECTED. The SARS-CoV-2 RNA is generally detectable in upper and lower  respiratory specimens dur ing the acute phase of infection.  Positive  results are indicative of active infection with SARS-CoV-2.  Clinical  correlation with patient history and other diagnostic information is  necessary to determine patient infection status.  Positive results do  not rule out bacterial infection or co-infection with other viruses. If result is PRESUMPTIVE POSTIVE SARS-CoV-2 nucleic acids MAY BE PRESENT.   A presumptive positive result was obtained on the submitted specimen  and confirmed on repeat testing.  While 2019 novel coronavirus  (SARS-CoV-2) nucleic acids may be present in the submitted sample  additional confirmatory testing may be necessary for epidemiological  and / or clinical management purposes  to differentiate between  SARS-CoV-2 and other Sarbecovirus currently known to infect humans.  If clinically indicated additional testing with an alternate test  methodology [phone]) is advised. The SARS-CoV-2 RNA is generally  detectable in upper and lower respiratory sp ecimens during the acute  phase of infection. The expected result is Negative. Fact Sheet for Patients:  StrictlyIdeas.no Fact Sheet for Healthcare Providers: BankingDealers.co.za This test is not yet approved or cleared by the [place] FDA and has been authorized for detection and/or diagnosis of SARS-CoV-2 by FDA under an Emergency Use Authorization (EUA).  This EUA will  remain in effect (meaning this test can be used) for the duration of the COVID-19 declaration under Section 564(b)(1) of the Act, 21 U.S.C. section 360bbb-3(b)(1), unless the authorization is terminated or revoked sooner. Performed at [facility], [place] [address], [place], [place] [address]          Radiology Studies: Dg Chest 2 View  Result Date: 04/03/2019 CLINICAL DATA:  Shortness of breath, CHF EXAM: CHEST - 2 VIEW COMPARISON:  04/01/2019 FINDINGS: Upper normal heart size with minimal pulmonary vascular congestion. Mediastinal contours normal. Atherosclerotic calcification aorta. Improved infiltrate LEFT lower lobe. Central peribronchial thickening. Small LEFT pleural effusion. No additional segmental consolidation or pneumothorax.  Bones unremarkable. IMPRESSION: Improved LEFT lower lobe pneumonia with small parapneumonic LEFT pleural effusion. Electronically Signed   By: Lavonia Dana M.D.   On: 04/03/2019 07:54   Ct Chest Wo Contrast  Result Date: 04/01/2019 CLINICAL DATA:  Hypoxia, pneumonia versus CHF. Interstitial lung disease. EXAM: CT CHEST WITHOUT CONTRAST TECHNIQUE: Multidetector CT imaging of the chest was performed following the standard protocol without IV contrast. COMPARISON:  02/05/2019 FINDINGS: Cardiovascular: Cardiomegaly without pericardial effusion. Extensive coronary calcification. There is also aortic atherosclerotic calcification. No acute vascular finding without contrast. Mediastinum/Nodes: Mild prominence of mediastinal lymph nodes that is likely congestive. No worrisome adenopathy. Negative esophagus. Lungs/Pleura: Moderate layering pleural effusions with patchy atelectasis. There is also ground-glass opacity in the primarily apically lungs. This same pattern was seen on 02/05/2019 chest CT, although the airspace opacity in the upper lobes is greater today. Inferior lingular segment was also collapsed on prior, but the central airways appear patent. Negative  coronavirus in the chart today. Upper Abdomen: No acute abnormality. Musculoskeletal: No acute or aggressive finding. Symmetric gynecomastia IMPRESSION: Moderate bilateral pleural effusion, atelectasis, and upper lobe airspace disease. This same pattern was seen on 02/05/2019 chest CT and may be congestive, although need correlation for pneumonia as the airspace opacities are patchy and asymmetric. Electronically Signed   By: Monte Fantasia M.D.   On: 04/01/2019 14:54        Scheduled Meds:  aspirin EC  81 mg Oral Daily   azithromycin  500 mg Oral Daily   carvedilol  6.25 mg Oral BID   clopidogrel  75 mg Oral Daily   furosemide  40 mg Intravenous BID   glimepiride  0.5 mg Oral BID WC   heparin  5,000 Units Subcutaneous Q8H   insulin aspart  0-5 Units Subcutaneous QHS   insulin aspart  0-9 Units Subcutaneous TID WC   iron polysaccharides  150 mg Oral Q breakfast   simvastatin  40 mg Oral Daily   sodium chloride flush  3 mL Intravenous Q12H   valACYclovir  1,000 mg Oral Daily   Continuous Infusions:  sodium chloride     cefTRIAXone (ROCEPHIN)  IV Stopped (04/02/19 2340)     LOS: 2 days    Time spent: 31 minutes    [place] J [place] DO [facility] Pager [phone]  If 7PM-7AM, please contact night-coverage [email or web address] Password [email or web address] 04/03/2019, 1:00 PM

## 2019-04-03 NOTE — Evaluation (Signed)
Physical Therapy Evaluation Patient Details Name: [name] MRN: [id number] DOB: 11-Dec-1940 Today's Date: 04/03/2019   History of Present Illness  78 y.o. 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 LBBB, diabetes, atrial fibrillation, CVA (2015), and R transmetataral amputation. Admitted 7/8/202 after he presented to ED from home with multiple concerns. He has a chest rash with pustules and ED provider also found a tick on his neck which was removed. Found to be hypoxic and placed on 2L O2.    Clinical Impression  PTA pt living with wife in multistory home with 6 steps to enter. Pt reports independence in mobility using cane and transmetatarsal orthotic, however also reports falling regularly, usually outside when he forgets his orthotic or his cane. Pt is limited in safe mobility by decreased safety awareness and balance. Pt is mod I for bed mobility, and transfers but minA for ambulation due to 1xLoB requiring assist. Pt is likely at his baseline level of function. PT recommending HHPT however pt reports he can not afford the copay, pt also needs supervision with mobility which will not be available for him as his wife works at [employer]. PT will continue to follow acutely.     Follow Up Recommendations Home health PT;Supervision for mobility/OOB(pt would benefit from HHPT but will refuse due to copay)    Equipment Recommendations  None recommended by PT       Precautions / Restrictions Precautions Precautions: Fall Precaution Comments: reports falling 2x/wk, especially when he forgets his toe prosthetic  Restrictions Weight Bearing Restrictions: No      Mobility  Bed Mobility Overal bed mobility: Modified Independent                Transfers Overall transfer level: Needs assistance Equipment used: None;Rolling walker (2  wheeled) Transfers: Sit to/from Stand Sit to Stand: Modified independent (Device/Increase time)         General transfer comment: able to power up and steady without use of RW  Ambulation/Gait Ambulation/Gait assistance: Min assist Gait Distance (Feet): 12 Feet Assistive device: None Gait Pattern/deviations: Step-through pattern Gait velocity: slowed Gait velocity interpretation: <1.31 ft/sec, indicative of household ambulator General Gait Details: pt refuses RW use, prefers cane, minA for steadying as he ambulates around foot of bed, pt reports this is normal for him and he does not think he needs the RW      Balance Overall balance assessment: Needs assistance Sitting-balance support: Feet supported;No upper extremity supported Sitting balance-Leahy Scale: Good     Standing balance support: No upper extremity supported;During functional activity Standing balance-Leahy Scale: Fair                               Pertinent Vitals/Pain Pain Assessment: No/denies pain    Home Living Family/patient expects to be discharged to:: Private residence Living Arrangements: Spouse/significant other Available Help at Discharge: Family;Available PRN/intermittently Type of Home: House Home Access: Stairs to enter Entrance Stairs-Rails: Can reach both Entrance Stairs-Number of Steps: 6 Home Layout: Two level;Other (Comment);Able to live on main level with bedroom/bathroom(has basement living area but stays upstairs) Home Equipment: Gilford Rile - 2 wheels;Cane - single point;Shower seat Additional Comments: pt has prosthetic toe insert for R transmet amputation not with him in hospital     Prior Function Level of Independence: Independent with assistive device(s)  Comments: uses cane when he feels off balance        Extremity/Trunk Assessment   Upper Extremity Assessment Upper Extremity Assessment: Overall WFL for tasks assessed    Lower Extremity  Assessment Lower Extremity Assessment: RLE deficits/detail;LLE deficits/detail RLE Deficits / Details: R transmetatarsal amputation, hip, knee and ankle ROM WFL, strength grossly 4/5  LLE Deficits / Details: ROM WFL, strength grossly 4/5       Communication   Communication: No difficulties  Cognition Arousal/Alertness: Awake/alert Behavior During Therapy: WFL for tasks assessed/performed Overall Cognitive Status: Impaired/Different from baseline Area of Impairment: Safety/judgement                         Safety/Judgement: Decreased awareness of safety;Decreased awareness of deficits            General Comments General comments (skin integrity, edema, etc.): Pt on RA on entry SaO2 >90%O2 throughout session, however could not progress ambulation out of room due to airborne/droplet precautions for Zoster        Assessment/Plan    PT Assessment Patient needs continued PT services  PT Problem List Decreased strength;Decreased activity tolerance;Decreased balance;Decreased mobility;Decreased safety awareness;Decreased knowledge of use of DME       PT Treatment Interventions Gait training;Stair training;Functional mobility training;Therapeutic activities;Balance training;Patient/family education    PT Goals (Current goals can be found in the Care Plan section)  Acute Rehab PT Goals Patient Stated Goal: go home PT Goal Formulation: With patient Time For Goal Achievement: 04/17/19 Potential to Achieve Goals: Fair    Frequency Min 3X/week   Barriers to discharge Decreased caregiver support         AM-PAC PT "6 Clicks" Mobility  Outcome Measure Help needed turning from your back to your side while in a flat bed without using bedrails?: None Help needed moving from lying on your back to sitting on the side of a flat bed without using bedrails?: None Help needed moving to and from a bed to a chair (including a wheelchair)?: None Help needed standing up from a  chair using your arms (e.g., wheelchair or bedside chair)?: None Help needed to walk in hospital room?: A Little Help needed climbing 3-5 steps with a railing? : A Little 6 Click Score: 22    End of Session Equipment Utilized During Treatment: Gait belt Activity Tolerance: Patient tolerated treatment well Patient left: in chair;with call bell/phone within reach;with chair alarm set Nurse Communication: Mobility status PT Visit Diagnosis: Unsteadiness on feet (R26.81);History of falling (Z91.81);Difficulty in walking, not elsewhere classified (R26.2)    Time: 8657-8469 PT Time Calculation (min) (ACUTE ONLY): 31 min   Charges:   PT Evaluation $PT Eval Moderate Complexity: 1 Mod PT Treatments $Gait Training: 8-22 mins        [name] B. Migdalia Dk PT, DPT Acute Rehabilitation Services Pager [phone] Office [phone]   [place] 04/03/2019, 11:19 AM

## 2019-04-03 NOTE — Telephone Encounter (Signed)
New Message           Patient is needing a appointment, he is in the hospital as of now would like to have a appointment on the 21 or 24 if possible.

## 2019-04-03 NOTE — Telephone Encounter (Signed)
No answer will try again later today.

## 2019-04-03 NOTE — Progress Notes (Signed)
[facility] Progress Note    Assessment/ Plan:   1.  CKD Stage G3b: At baseline creatinine.  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  Will check UP/C.  Watch renal function closely in the setting of having received vanc in ED, valacyclovir administration, and diuresis.  I agree with Lasix administration as per cardiology/AHF.  He is a poor candidate for dialysis given his severe cardiomyopathy.  Fortunately Cr remains stable.     2.  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 On carvedilol 6.25 mg BID.  Got IV Lasix last night with robust urine output of 3L.  AHF consulted. No ARB/ ACEi d/t low eGFR.   3.  Shingles: on valacyclovir 1000 mg daily (appropriately renally dosed)  4.  Possible pneumonia: s/p vanc/ cefepime in ED, on azithro/ CTX for CAP now (as well as valtrex for possible zoster pneumonia).  CT chest without contrast 7/7 with bilateral pleural effusions, atalectasis, and upper airspace disease.  Repeat 2V CXR improvement in LLL pneumonia.  5.  CAD: not a CABG candidate.  On ASA and Plavix.       6.  Dispo: pending improvement  Subjective:    No events.  Doing well.  Feeling better.       Objective:   BP 132/75 (BP Location: Left Arm)   Pulse 74   Temp 97.6 F (36.4 C) (Oral)   Resp 16   Ht '5\' 10"'  (1.778 m)   Wt 66.4 kg   SpO2 96%   BMI 21.01 kg/m   Intake/Output Summary (Last 24 hours) at 04/03/2019 1205 Last data filed at 04/03/2019 0900 Gross per 24 hour  Intake 503 ml  Output 1500 ml  Net -997 ml   Weight change: -5.352 kg  Physical Exam: GEN NAD, sitting on side of bed HEENT EOMI PERRL NECK JVD improved PULM normal WOB, wearing O2, bibasilar crackles L>R and some rhonchi, improved CV RRR ABD nondistended, nontender EXT trace + LE edema, improved from yesterday distal extremities are cool NEURO AAO x 3  SKIN + multiple scabbed lesions on R flank, R and L neck, healed lesions R inner thigh and back of R  knee   Imaging: Dg Chest 2 View  Result Date: 04/03/2019 CLINICAL DATA:  Shortness of breath, CHF EXAM: CHEST - 2 VIEW COMPARISON:  04/01/2019 FINDINGS: Upper normal heart size with minimal pulmonary vascular congestion. Mediastinal contours normal. Atherosclerotic calcification aorta. Improved infiltrate LEFT lower lobe. Central peribronchial thickening. Small LEFT pleural effusion. No additional segmental consolidation or pneumothorax. Bones unremarkable. IMPRESSION: Improved LEFT lower lobe pneumonia with small parapneumonic LEFT pleural effusion. Electronically Signed   By: Lavonia Dana M.D.   On: 04/03/2019 07:54   Ct Chest Wo Contrast  Result Date: 04/01/2019 CLINICAL DATA:  Hypoxia, pneumonia versus CHF. Interstitial lung disease. EXAM: CT CHEST WITHOUT CONTRAST TECHNIQUE: Multidetector CT imaging of the chest was performed following the standard protocol without IV contrast. COMPARISON:  02/05/2019 FINDINGS: Cardiovascular: Cardiomegaly without pericardial effusion. Extensive coronary calcification. There is also aortic atherosclerotic calcification. No acute vascular finding without contrast. Mediastinum/Nodes: Mild prominence of mediastinal lymph nodes that is likely congestive. No worrisome adenopathy. Negative esophagus. Lungs/Pleura: Moderate layering pleural effusions with patchy atelectasis. There is also ground-glass opacity in the primarily apically lungs. This same pattern was seen on 02/05/2019 chest CT, although the airspace opacity in the upper lobes is greater today. Inferior lingular segment was also collapsed on prior, but the central airways appear patent. Negative coronavirus in  the chart today. Upper Abdomen: No acute abnormality. Musculoskeletal: No acute or aggressive finding. Symmetric gynecomastia IMPRESSION: Moderate bilateral pleural effusion, atelectasis, and upper lobe airspace disease. This same pattern was seen on 02/05/2019 chest CT and may be congestive, although need  correlation for pneumonia as the airspace opacities are patchy and asymmetric. Electronically Signed   By: Monte Fantasia M.D.   On: 04/01/2019 14:54    Labs: BMET Recent Labs  Lab 04/01/19 0854 04/02/19 0708 04/03/19 0447  NA 139 140 134*  K 4.8 4.5 4.2  CL 112* 106 102  CO2 19* 22 22  GLUCOSE 197* 143* 191*  BUN 44* 43* 45*  CREATININE 2.06* 2.07* 2.12*  CALCIUM 8.9 9.0 8.2*   CBC Recent Labs  Lab 04/01/19 0854  WBC 6.0  NEUTROABS 4.0  HGB 11.4*  HCT 36.1*  MCV 95.5  PLT 214    Medications:    . aspirin EC  81 mg Oral Daily  . azithromycin  500 mg Oral Daily  . carvedilol  6.25 mg Oral BID  . clopidogrel  75 mg Oral Daily  . furosemide  40 mg Intravenous BID  . glimepiride  0.5 mg Oral BID WC  . heparin  5,000 Units Subcutaneous Q8H  . iron polysaccharides  150 mg Oral Q breakfast  . simvastatin  40 mg Oral Daily  . sodium chloride flush  3 mL Intravenous Q12H  . valACYclovir  1,000 mg Oral Daily     Madelon Lips MD [facility] pgr [phone] 04/03/2019, 12:05 PM

## 2019-04-03 NOTE — Progress Notes (Signed)
Advanced Heart Failure Rounding Note   Subjective:    Continues to diurese on IV lasix. Breathing feels better. No orthopnea or PND.   Renal function starting to bump.    Objective:   Weight Range:  Vital Signs:   Temp:  [97.6 F (36.4 C)-97.9 F (36.6 C)] 97.6 F (36.4 C) (07/09 0525) Pulse Rate:  [74-75] 74 (07/09 0525) Resp:  [16-18] 16 (07/09 0525) BP: (106-132)/(64-75) 132/75 (07/09 0525) SpO2:  [96 %-100 %] 96 % (07/09 0537) Weight:  [66.4 kg] 66.4 kg (07/09 0531) Last BM Date: 03/30/19  Weight change: Filed Weights   04/01/19 1511 04/02/19 0650 04/03/19 0531  Weight: 69.3 kg 66.5 kg 66.4 kg    Intake/Output:   Intake/Output Summary (Last 24 hours) at 04/03/2019 1647 Last data filed at 04/03/2019 1300 Gross per 24 hour  Intake 743 ml  Output 850 ml  Net -107 ml     Physical Exam: General:  Elderly frail appearing. No resp difficulty HEENT: normal Neck: supple. JVP 6-7 Carotids 2+ bilat; no bruits. No lymphadenopathy or thryomegaly appreciated. Cor: PMI nondisplaced. Regular rate & rhythm. No rubs, gallops or murmurs. Lungs: clear Abdomen: soft, nontender, nondistended. No hepatosplenomegaly. No bruits or masses. Good bowel sounds. Extremities: no cyanosis, clubbing, rash, edema Neuro: alert & orientedx3, cranial nerves grossly intact. moves all 4 extremities w/o difficulty. Affect pleasant   Telemetry: NSR 70-80s Personally reviewed   Labs: Basic Metabolic Panel: Recent Labs  Lab 04/01/19 0854 04/02/19 0708 04/03/19 0447  NA 139 140 134*  K 4.8 4.5 4.2  CL 112* 106 102  CO2 19* 22 22  GLUCOSE 197* 143* 191*  BUN 44* 43* 45*  CREATININE 2.06* 2.07* 2.12*  CALCIUM 8.9 9.0 8.2*  MG  --  2.1  --     Liver Function Tests: Recent Labs  Lab 04/01/19 0854  AST 18  ALT 24  ALKPHOS 65  BILITOT 0.6  PROT 6.0*  ALBUMIN 3.4*   No results for input(s): LIPASE, AMYLASE in the last 168 hours. No results for input(s): AMMONIA in the last 168  hours.  CBC: Recent Labs  Lab 04/01/19 0854  WBC 6.0  NEUTROABS 4.0  HGB 11.4*  HCT 36.1*  MCV 95.5  PLT 214    Cardiac Enzymes: No results for input(s): CKTOTAL, CKMB, CKMBINDEX, TROPONINI in the last 168 hours.  BNP: BNP (last 3 results) Recent Labs    02/19/19 1023 04/01/19 0854 04/02/19 0708  BNP 465.4* 1,814.5* 1,723.5*    ProBNP (last 3 results) No results for input(s): PROBNP in the last 8760 hours.    Other results:  Imaging: Dg Chest 2 View  Result Date: 04/03/2019 CLINICAL DATA:  Shortness of breath, CHF EXAM: CHEST - 2 VIEW COMPARISON:  04/01/2019 FINDINGS: Upper normal heart size with minimal pulmonary vascular congestion. Mediastinal contours normal. Atherosclerotic calcification aorta. Improved infiltrate LEFT lower lobe. Central peribronchial thickening. Small LEFT pleural effusion. No additional segmental consolidation or pneumothorax. Bones unremarkable. IMPRESSION: Improved LEFT lower lobe pneumonia with small parapneumonic LEFT pleural effusion. Electronically Signed   By: Lavonia Dana M.D.   On: 04/03/2019 07:54     Medications:     Scheduled Medications: . aspirin EC  81 mg Oral Daily  . azithromycin  500 mg Oral Daily  . carvedilol  6.25 mg Oral BID  . clopidogrel  75 mg Oral Daily  . [START ON 04/04/2019] furosemide  80 mg Oral Daily  . glimepiride  0.5 mg Oral BID WC  .  heparin  5,000 Units Subcutaneous Q8H  . insulin aspart  0-5 Units Subcutaneous QHS  . insulin aspart  0-9 Units Subcutaneous TID WC  . iron polysaccharides  150 mg Oral Q breakfast  . isosorbide-hydrALAZINE  1 tablet Oral TID  . simvastatin  40 mg Oral Daily  . sodium chloride flush  3 mL Intravenous Q12H  . [START ON 04/04/2019] spironolactone  12.5 mg Oral Daily  . valACYclovir  1,000 mg Oral Daily    Infusions: . sodium chloride    . cefTRIAXone (ROCEPHIN)  IV Stopped (04/02/19 2340)    PRN Medications: sodium chloride, acetaminophen, hydrocortisone cream,  ondansetron (ZOFRAN) IV, sodium chloride flush, zolpidem   Assessment/Plan:   1.  Acute on chronic biventricular CHF: - Due to severe iCM  - echo 5/20: 15-20% - In reviewing CT I was initially mor suspicious for multifocal PNA with mild HF overlap however now he is starting to look more and more likely primarily HF - He looks full diuresed. Creatinine starting to bump  - Stop IV lasix.  - Switch home lasix to torsemide 40 daily for better absorption   From our standpoint can go home tomorrow on   Torsemide 40 daily (replaces lasix) Carvedilol 3.125 bid (lower dose) Bidil 1 tab tid Spiro 12.5 daily Ecasa 81 daily Plavix 75mg  daily  2.  Shingles with possible zoster pneumonia - Started on antibiotics for CAP. - Blood cultures, procalcitonin and HIV antibody negative  3.  CKD 3-4 - Renal function stable with creatinine ~ 2.0 - Nephrology following as well  4. CAD - severe 3v CAD - no current s/s of ischemia. Continue DAPT and statin  5. Acute hypoxic respiratory failure. - as above. Suspect primarily due to HF.  - CXR improved  HF will s/o. We will arrange f/u.    Length of Stay: 2   Glori Bickers, MD 04/03/2019, 4:47 PM  Advanced Heart Failure Team Pager [phone] (M-F; 7a - 4p)  Please contact [facility] for night-coverage after hours (4p -7a ) and weekends on [email or web address]

## 2019-04-03 NOTE — Progress Notes (Signed)
Inpatient Diabetes Program Recommendations  AACE/ADA: New Consensus Statement on Inpatient Glycemic Control  Target Ranges:  Prepandial:   less than 140 mg/dL      Peak postprandial:   less than 180 mg/dL (1-2 hours)      Critically ill patients:  140 - 180 mg/dL  Results for [name], [name] (MRN [id number]) as of 04/03/2019 11:29  Ref. Range 04/03/2019 08:06 04/03/2019 11:08  Glucose-Capillary Latest Ref Range: 70 - 99 mg/dL 155 (H) 234 (H)   Results for [name], [name] (MRN [id number]) as of 04/03/2019 11:29  Ref. Range 03/27/2016 07:17  Hemoglobin A1C Latest Ref Range: 4.8 - 5.6 % 7.1 (H)   Review of Glycemic Control  Diabetes history: DM2 Outpatient Diabetes medications: Amaryl 0.5 mg BID, Metformin 1000 mg daily Current orders for Inpatient glycemic control: Amaryl 0.5 mg BID  Inpatient Diabetes Program Recommendations:   Correction (SSI): While inpatient, please consider ordering Novolog 0-9 units TID with meals and Novolog 0-5 units QHS.  Diet: If appropriate, please add Carb Modified to Heart Healthy diet.  HbgA1C: Please consider ordering an A1C to evaluate glycemic control over the past 2-3 months.  Thanks, Barnie Alderman, RN, MSN, CDE Diabetes Coordinator Inpatient Diabetes Program [phone] (Team Pager from 8am to 5pm)

## 2019-04-04 ENCOUNTER — Ambulatory Visit: Payer: Medicare HMO | Admitting: Cardiovascular Disease

## 2019-04-04 LAB — BASIC METABOLIC PANEL
Anion gap: 10 (ref 5–15)
BUN: 44 mg/dL — ABNORMAL HIGH (ref 8–23)
CO2: 24 mmol/L (ref 22–32)
Calcium: 8.4 mg/dL — ABNORMAL LOW (ref 8.9–10.3)
Chloride: 103 mmol/L (ref 98–111)
Creatinine, Ser: 1.95 mg/dL — ABNORMAL HIGH (ref 0.61–1.24)
GFR calc Af Amer: 38 mL/min — ABNORMAL LOW (ref >60)
GFR calc non Af Amer: 33 mL/min — ABNORMAL LOW (ref >60)
Glucose, Bld: 192 mg/dL — ABNORMAL HIGH (ref 70–99)
Potassium: 4.2 mmol/L (ref 3.5–5.1)
Sodium: 137 mmol/L (ref 135–145)

## 2019-04-04 LAB — GLUCOSE, CAPILLARY
Glucose-Capillary: 221 mg/dL — ABNORMAL HIGH (ref 70–99)
Glucose-Capillary: 133 mg/dL — ABNORMAL HIGH (ref 70–99)

## 2019-04-04 MED ORDER — AZITHROMYCIN 250 MG PO TABS: 250.0000 mg | ORAL_TABLET | Freq: Every day | ORAL | 0 refills | Status: AC

## 2019-04-04 MED ORDER — TORSEMIDE 20 MG PO TABS: 40.0000 mg | ORAL_TABLET | Freq: Every day | ORAL | 0 refills | Status: DC

## 2019-04-04 MED ORDER — CEFDINIR 300 MG PO CAPS: 300.0000 mg | ORAL_CAPSULE | Freq: Two times a day (BID) | ORAL | 0 refills | Status: AC

## 2019-04-04 MED ORDER — VALACYCLOVIR HCL 1 G PO TABS: 1000.0000 mg | ORAL_TABLET | Freq: Every day | ORAL | 0 refills | Status: AC

## 2019-04-04 MED ORDER — SPIRONOLACTONE 25 MG PO TABS: 12.5000 mg | ORAL_TABLET | Freq: Every day | ORAL | 0 refills | Status: DC

## 2019-04-04 MED ORDER — ISOSORB DINITRATE-HYDRALAZINE 20-37.5 MG PO TABS: 1.0000 | ORAL_TABLET | Freq: Three times a day (TID) | ORAL | 0 refills | Status: DC

## 2019-04-04 MED ORDER — CARVEDILOL 3.125 MG PO TABS: 3.1250 mg | ORAL_TABLET | Freq: Two times a day (BID) | ORAL | 0 refills | Status: DC

## 2019-04-04 MED FILL — BIDIL TABLET: 20-37.5 | 30 days supply | Qty: 90 | Fill #0

## 2019-04-04 MED FILL — SPIRONOLACTONE 25 MG TABS: 25 | 30 days supply | Qty: 15 | Fill #0

## 2019-04-04 MED FILL — TORSEMIDE 20 MG TABLET: 20 | 30 days supply | Qty: 60 | Fill #0

## 2019-04-04 MED FILL — CARVEDILOL 3.125 MG TABLET: 3.125 | 30 days supply | Qty: 60 | Fill #0

## 2019-04-04 MED FILL — AZITHROMYCIN 250 MG TABS: 250 | 2 days supply | Qty: 2 | Fill #0

## 2019-04-04 MED FILL — CEFDINIR 300 MG CAPSULE: 300 | 4 days supply | Qty: 8 | Fill #0

## 2019-04-04 MED FILL — valACYclovir HCL 1 GM TABS: 1 | 4 days supply | Qty: 4 | Fill #0

## 2019-04-04 NOTE — Telephone Encounter (Signed)
Spoke with patient's wife who states patient has not been satisfied with their care at HF clinic and wanted to return to see Dr. Acie Fredrickson. She was not aware of appointment with [name] scheduled in July. I advised her that she and patient can go to that appointment and then decide whether to reschedule or cancel the September appointment with Dr. Acie Fredrickson. I advised that patient would not need to see both doctors unless he is released from HF clinic. Wife verbalized understanding and thanked me for the call.

## 2019-04-04 NOTE — TOC Transition Note (Signed)
Transition of Care [facility]) - CM/SW Discharge Note   Patient Details  Name: [name] MRN: [id number] Date of Birth: Feb 25, 1941  [facility] CM/SW Contact:  Bethena Roys, RN Phone Number: 04/04/2019, 10:28 AM   Clinical Narrative: Pt presented for Acute Respiratory Failure. PTA from home with the support of spouse. CM discussed with patient regarding [facility]- patient declined and CM did make patient aware that if he needs services in the future to call his PCP. Patient states he has no issues obtaining medications from [facility]. CM did discuss with patient regarding consult for palliative services. Patient is agreeable to palliative services via [facility]. Referral called to Manus Gunning with [facility]- contact information for wife provided. No further needs from CM at this time.        Final next level of care: Home/Self Care Barriers to Discharge: No Barriers Identified   Patient Goals and CMS Choice Patient states their goals for this hospitalization and ongoing recovery are:: "to return home"      Discharge Placement                       Discharge Plan and Services In-house Referral: [facility], Hospice / Palliative Care([facility].) Discharge Planning Services: CM Consult                      HH Arranged: Refused HH          Social Determinants of Health (SDOH) Interventions     Readmission Risk Interventions No flowsheet data found.

## 2019-04-04 NOTE — Progress Notes (Signed)
Chart reviewed.  Pt to be discharged today.   Agree from a renal perspective that's fine.   Fine to replace Lasix with torsemide.   Will make sure he has followup.  Madelon Lips MD [facility] pgr [phone]

## 2019-04-04 NOTE — Discharge Instructions (Signed)
Heart Failure Medicines  Heart failure is a condition in which the heart cannot pump enough blood through the body. This can cause symptoms such as shortness of breath, fatigue, and confusion. There are two types of heart failure:  Heart failure with reduced ejection fraction. In this type, the heart muscle is weak.  Heart failure with preserved ejection fraction. In this type, the heart muscle does not fill with blood properly and may be stiff. There is no cure for heart failure. However, being treated with medicines and following your health care provider's instructions about a healthy lifestyle can help you stay active, avoid problems, and live longer. Talk to your health care provider about all medicines that you are taking, how often you should take them, and what possible problems (side effects) they may cause. Talk with your health care provider if you have difficulty affording your medicines. What are some common medicines for heart failure? The medicines that are prescribed for you will depend on your symptoms, the type of heart failure you have, and the cause of your heart failure. In some cases, you may need to take more than one medicine. You will be prescribed the following medicines according to your type of heart failure: Heart failure with reduced ejection fraction  Beta-blockers.  Angiotensin-converting enzyme (ACE) inhibitors.  Angiotensin II receptor blockers (ARBs).  Angiotensin receptor neprilysin inhibitors (ARNIs).  Aldosterone antagonists.  Diuretics.  Digoxin.  Nitrates. Heart failure with preserved ejection fraction  Medicines to control blood pressure, including: ? Beta-blockers. ? Angiotensin-converting enzyme (ACE) inhibitors. ? Angiotensin II receptor blockers (ARBs).  Diuretics.  Aldosterone antagonists. What should I know about beta-blockers?  These medicines lower your blood pressure and slow your heart rate. This helps to lessen your heart's  workload.  They can help to relieve chest pain (angina).  They can help to improve your heart's ability to pump.  They may cause asthma attacks and shortness of breath.  Because these medicines slow your heart rate, it is important not to overwork yourself while exercising. Talk to your health care provider about what your target heart rate should be while you exercise.  These medicines can hide the symptoms of low blood sugar (glucose), which is also called hypoglycemia. If you have diabetes, make sure to check your blood glucose carefully. If you have hypoglycemia, talk to your health care provider about adjusting your medicines.  Beta-blockers may make you feel dizzy or light-headed at first. Do not drive or use heavy machinery when you first start these medicines. Ask your health care provider when it is safe for you to drive. What should I know about ACE inhibitors or ARBs?  These medicines help to widen arteries and veins. This action lowers your blood pressure and lessens the strain on your heart, making it easier for your heart to pump.  They can help to lessen the symptoms of heart failure.  ARBs are often used if a person cannot take ACE inhibitors.  ACE inhibitors may cause a dry cough.  In rare cases, ACE inhibitors and ARBs can cause swelling of the tongue or lips, other swelling, taste problems, and skin rashes. If these symptoms occur, stop taking the medicines and contact your health care provider.  Do not take ACE inhibitors if you are pregnant or may become pregnant. These medicines can cause health problems in an unborn baby.  These medicines may cause dizziness. You may need regular checkups and blood tests to monitor how they are working. What should I  know about ARNIs?  These medicines are a combination of an ARB and another medicine. They lower your blood pressure.  Side effects may include dry cough, dizziness, low blood pressure, and kidney problems.  Do not  take ARNIs if you are already taking ACE inhibitors or ARBs.  You may notice increased urination when taking these medicines.  ARNIs can raise the amount of potassium in the blood. Your potassium levels will be monitored regularly by your health care provider. What should I know about aldosterone antagonists?  They help the body to remove excess sodium through urination. This helps to lessen the amount of blood that the heart needs to pump.  They can also help to lower blood pressure and improve the heart's ability to pump blood.  They may cause dizziness, diarrhea, coughing, or flu-like symptoms.  They should not be used if you have type 2 diabetes.  They can raise the amount of potassium in the blood. Your potassium levels will be monitored regularly by your health care provider.  These medicines can make men's breasts large and tender. What should I know about diuretics?  Diuretics are medicines that help the body get rid of excess fluid through urination. They can also help lessen your heart's workload.  They help to lessen fluid buildup in the lungs, ankles, and feet.  They help to lower your blood pressure.  They can worsen problems with controlling urination (urinary incontinence).  They may cause dizziness, headaches, muscle cramps, and an upset stomach.  They can cause weak muscles, dry mouth, or confusion. It is important to drink plenty of fluids while taking these medicines, especially while exercising or on hot days. What should I know about digoxin?  Digoxin helps the heart pump more blood efficiently. It also lowers your heart rate.  It can help ease heart failure symptoms and may be used if other medicines do not work.  It can also help with irregular heartbeat (arrhythmia).  It may cause stomach problems, fatigue, headache, drowsiness, or vision problems. What should I know about nitrates?  Nitrates relax the blood vessels and increase oxygen and blood  supply to the heart. They also lower the blood pressure.  They are usually taken to lessen chest pain.  They may cause headaches, flushing, or irregular heartbeat. Summary  A healthy lifestyle and treatment with medicine will relieve symptoms of heart failure.  In some cases, you may need to take more than one medicine.  It is important to talk to your health care provider about how often you should take your medicines. Do not skip a dose or change your dosage.  Talk to your health care provider about possible side effects of these medicines. This information is not intended to replace advice given to you by your health care provider. Make sure you discuss any questions you have with your health care provider. Document Released: 01/26/2017 Document Revised: 09/26/2017 Document Reviewed: 01/26/2017 Elsevier Patient Education  2020 [place]. Heart Failure Exacerbation  Heart failure is a condition in which the heart does not fill up with enough blood, and therefore does not pump enough blood and oxygen to the body. When this happens, parts of the body do not get the blood and oxygen they need to function properly. This can cause symptoms such as breathing problems, fatigue, swelling, and confusion. Heart failure exacerbation refers to heart failure symptoms that get worse. The symptoms may get worse suddenly or develop slowly over time. Heart failure exacerbation is a serious medical  problem that should be treated right away. What are the causes? A heart failure exacerbation can be triggered by:  Not taking your heart failure medicines correctly.  Infections.  Eating an unhealthy diet or a diet that is high in salt (sodium).  Drinking too much fluid.  Drinking alcohol.  Taking illegal drugs, such as cocaine or methamphetamine.  Not exercising. Other causes include:  Other heart conditions such as an irregular heartbeat (arrhythmia).  Anemia.  Other medical problems, such  as kidney failure. Sometimes the cause of the exacerbation is not known. What are the signs or symptoms? When heart failure symptoms suddenly or slowly get worse, this may be a sign of heart failure exacerbation. Symptoms of heart failure include:  Breathing problems or shortness of breath.  Chronic coughing or wheezing.  Fatigue.  Nausea or lack of appetite.  Feeling light-headed.  Confusion or memory loss.  Increased heart rate or irregular heartbeat.  Buildup of fluid in the legs, ankles, feet, or abdomen.  Difficulty breathing when lying down. How is this diagnosed? This condition is diagnosed based on:  Your symptoms and medical history.  A physical exam. You may also have tests, including:  Electrocardiogram (ECG). This test measures the electrical activity of your heart.  Echocardiogram. This test uses sound waves to take a picture of your heart to see how well it works.  Blood tests.  Imaging tests, such as: ? Chest X-ray. ? MRI. ? Ultrasound.  Stress test. This test examines how well your heart functions when you exercise. Your heart is monitored while you exercise on a treadmill or exercise bike. If you cannot exercise, medicines may be used to increase your heartbeat in place of exercise.  Cardiac catheterization. During this test, a thin, flexible tube (catheter) is inserted into a blood vessel and threaded up to your heart. This test allows your health care provider to check the arteries that lead to your heart (coronary arteries).  Right heart catheterization. During this test, the pressure in your heart is measured. How is this treated? This condition may be treated by:  Adjusting your heart medicines.  Maintaining a healthy lifestyle. This includes: ? Eating a heart-healthy diet that is low in sodium. ? Not using any products that contain nicotine or tobacco, such as cigarettes and e-cigarettes. ? Regular exercise. ? Monitoring your fluid  intake. ? Monitoring your weight and reporting changes to your health care provider.  Treating sleep apnea, if you have this condition.  Surgery. This may include: ? Implanting a device that helps both sides of your heart contract at the same time (cardiac resynchronization therapy device). This can help with heart function and relieve heart failure symptoms. ? Implanting a device that can correct heart rhythm problems (implantable cardioverter defibrillator). ? Connecting a device to your heart to help it pump blood (ventricular assist device). ? Heart transplant. Follow these instructions at home: Medicines  Take over-the-counter and prescription medicines only as told by your health care provider.  Do not stop taking your medicines or change the amount you take. If you are having problems or side effects from your medicines, talk to your health care provider.  If you are having difficulty paying for your medicines, contact a social worker or your clinic. There are many programs to assist with medicine costs.  Talk to your health care provider before starting any new medicines or supplements.  Make sure your health care provider and pharmacist have a list of all the medicines you are  taking. Eating and drinking   Avoid drinking alcohol.  Eat a heart-healthy diet as told by your health care provider. This includes: ? Plenty of fruits and vegetables. ? Lean proteins. ? Low-fat dairy. ? Whole grains. ? Foods that are low in sodium. Activity   Exercise regularly as told by your health care provider. Balance exercise with rest.  Ask your health care provider what activities are safe for you. This includes sexual activity, exercise, and daily tasks at home or work. Lifestyle  Do not use any products that contain nicotine or tobacco, such as cigarettes and e-cigarettes. If you need help quitting, ask your health care provider.  Maintain a healthy weight. Ask your health care  provider what weight is healthy for you.  Consider joining a patient support group. This can help with emotional problems you may have, such as stress and anxiety. General instructions  Talk to your health care provider about flu and pneumonia vaccines.  Keep a list of medicines that you are taking. This may help in emergency situations.  Keep all follow-up visits as told by your health care provider. This is important. Contact a health care provider if:  You have questions about your medicines or you miss a dose.  You feel anxious, depressed, or stressed.  You have swelling in your feet, ankles, legs, or abdomen.  You have shortness of breath during activity or exercise.  You have a cough.  You have a fever.  You have trouble sleeping.  You gain 2-3 lb (1-1.4 kg) in 24 hours or 5 lb (2.3 kg) in a week. Get help right away if:  You have chest pain.  You have shortness of breath while resting.  You have severe fatigue.  You are confused.  You have severe dizziness.  You have a rapid or irregular heartbeat.  You have nausea or you vomit.  You have a cough that is worse at night or you cannot lie flat.  You have a cough that will not go away.  You have severe depression or sadness. Summary  When heart failure symptoms get worse, it is called heart failure exacerbation.  Common causes of this condition include taking medicines incorrectly, infections, and drinking alcohol.  This condition may be treated by adjusting medicines, maintaining a healthy lifestyle, or surgery.  Do not stop taking your medicines or change the amount you take. If you are having problems or side effects from your medicines, talk to your health care provider. This information is not intended to replace advice given to you by your health care provider. Make sure you discuss any questions you have with your health care provider. Document Released: 01/23/2017 Document Revised: 08/24/2017  Document Reviewed: 01/23/2017 Elsevier Patient Education  2020 [place]. Heart Failure Eating Plan Heart failure, also called congestive heart failure, occurs when your heart does not pump blood well enough to meet your body's needs for oxygen-rich blood. Heart failure is a long-term (chronic) condition. Living with heart failure can be challenging. However, following your health care provider's instructions about a healthy lifestyle and working with a diet and nutrition specialist (dietitian) to choose the right foods may help to improve your symptoms. What are tips for following this plan? Reading food labels  Check food labels for the amount of sodium per serving. Choose foods that have less than 140 mg (milligrams) of sodium in each serving.  Check food labels for the number of calories per serving. This is important if you need to limit  your daily calorie intake to lose weight.  Check food labels for the serving size. If you eat more than one serving, you will be eating more sodium and calories than what is listed on the label.  Look for foods that are labeled as "sodium-free," "very low sodium," or "low sodium." ? Foods labeled as "reduced sodium" or "lightly salted" may still have more sodium than what is recommended for you. Cooking  Avoid adding salt when cooking. Ask your health care provider or dietitian before using salt substitutes.  Season food with salt-free seasonings, spices, or herbs. Check the label of seasoning mixes to make sure they do not contain salt.  Cook with heart-healthy oils, such as olive, canola, soybean, or sunflower oil.  Do not fry foods. Cook foods using low-fat methods, such as baking, boiling, grilling, and broiling.  Limit unhealthy fats when cooking by: ? Removing the skin from poultry, such as chicken. ? Removing all visible fats from meats. ? Skimming the fat off from stews, soups, and gravies before serving them. Meal planning   Limit  your intake of: ? Processed, canned, or pre-packaged foods. ? Foods that are high in trans fat, such as fried foods. ? Sweets, desserts, sugary drinks, and other foods with added sugar. ? Full-fat dairy products, such as whole milk.  Eat a balanced diet that includes: ? 4-5 servings of fruit each day and 4-5 servings of vegetables each day. At each meal, try to fill half of your plate with fruits and vegetables. ? Up to 6-8 servings of whole grains each day. ? Up to 2 servings of lean meat, poultry, or fish each day. One serving of meat is equal to 3 oz. This is about the same size as a deck of cards. ? 2 servings of low-fat dairy each day. ? Heart-healthy fats. Healthy fats called omega-3 fatty acids are found in foods such as flaxseed and cold-water fish like sardines, salmon, and mackerel.  Aim to eat 25-35 g (grams) of fiber a day. Foods that are high in fiber include apples, broccoli, carrots, beans, peas, and whole grains.  Do not add salt or condiments that contain salt (such as soy sauce) to foods before eating.  When eating at a restaurant, ask that your food be prepared with less salt or no salt, if possible.  Try to eat 2 or more vegetarian meals each week.  Eat more home-cooked food and eat less restaurant, buffet, and fast food. General information  Do not eat more than 2,300 mg of salt (sodium) a day. The amount of sodium that is recommended for you may be lower, depending on your condition.  Maintain a healthy body weight as directed. Ask your health care provider what a healthy weight is for you. ? Check your weight every day. ? Work with your health care provider and dietitian to make a plan that is right for you to lose weight or maintain your current weight.  Limit how much fluid you drink. Ask your health care provider or dietitian how much fluid you can have each day.  Limit or avoid alcohol as told by your health care provider or dietitian. Recommended  foods The items listed may not be a complete list. Talk with your dietitian about what dietary choices are best for you. Fruits All fresh, frozen, and canned fruits. Dried fruits, such as raisins, prunes, and cranberries. Vegetables All fresh vegetables. Vegetables that are frozen without sauce or added salt. Low-sodium or sodium-free canned vegetables. Grains  Bread with less than 80 mg of sodium per slice. Whole-wheat pasta, quinoa, and brown rice. Oats and oatmeal. Barley. [place]. Grits and cream of wheat. Whole-grain and whole-wheat cold cereal. Meats and other protein foods Lean cuts of meat. Skinless chicken and [place]. Fish with high omega-3 fatty acids, such as salmon, sardines, and other cold-water fishes. Eggs. Dried beans, peas, and edamame. Unsalted nuts and nut butters. Dairy Low-fat or nonfat (skim) milk and dried milk. Rice milk, soy milk, and almond milk. Low-fat or nonfat yogurt. Small amounts of reduced-sodium block cheese. Low-sodium cottage cheese. Fats and oils Olive, canola, soybean, flaxseed, or sunflower oil. Avocado. Sweets and desserts Apple sauce. Granola bars. Sugar-free pudding and gelatin. Frozen fruit bars. Seasoning and other foods Fresh and dried herbs. Lemon or lime juice. Vinegar. Low-sodium ketchup. Salt-free marinades, salad dressings, sauces, and seasonings. The items listed above may not be a complete list of foods and beverages you can eat. Contact a dietitian for more information. Foods to avoid The items listed may not be a complete list. Talk with your dietitian about what dietary choices are best for you. Fruits Fruits that are dried with sodium-containing preservatives. Vegetables Canned vegetables. Frozen vegetables with sauce or seasonings. Creamed vegetables. [place] fries. Onion rings. Pickled vegetables and sauerkraut. Grains Bread with more than 80 mg of sodium per slice. Hot or cold cereal with more than 140 mg sodium per serving. Salted  pretzels and crackers. Pre-packaged breadcrumbs. Bagels, croissants, and biscuits. Meats and other protein foods Ribs and chicken wings. Bacon, ham, pepperoni, bologna, salami, and packaged luncheon meats. Hot dogs, bratwurst, and sausage. Canned meat. Smoked meat and fish. Salted nuts and seeds. Dairy Whole milk, half-and-half, and cream. Buttermilk. Processed cheese, cheese spreads, and cheese curds. Regular cottage cheese. Feta cheese. Shredded cheese. String cheese. Fats and oils Butter, lard, shortening, ghee, and bacon fat. Canned and packaged gravies. Seasoning and other foods Onion salt, garlic salt, table salt, and sea salt. Marinades. Regular salad dressings. Relishes, pickles, and olives. Meat flavorings and tenderizers, and bouillon cubes. Horseradish, ketchup, and mustard. Worcestershire sauce. Teriyaki sauce, soy sauce (including reduced sodium). Hot sauce and Tabasco sauce. Steak sauce, fish sauce, oyster sauce, and cocktail sauce. Taco seasonings. Barbecue sauce. Tartar sauce. The items listed above may not be a complete list of foods and beverages you should avoid. Contact a dietitian for more information. Summary  A heart failure eating plan includes changes that limit your intake of sodium and unhealthy fat, and it may help you lose weight or maintain a healthy weight. Your health care provider may also recommend limiting how much fluid you drink.  Most people with heart failure should eat no more than 2,300 mg of salt (sodium) a day. The amount of sodium that is recommended for you may be lower, depending on your condition.  Contact your health care provider or dietitian before making any major changes to your diet. This information is not intended to replace advice given to you by your health care provider. Make sure you discuss any questions you have with your health care provider. Document Released: 01/26/2017 Document Revised: 11/07/2018 Document Reviewed:  01/26/2017 Elsevier Patient Education  [place]. Heart Failure Action Plan A heart failure action plan helps you understand what to do when you have symptoms of heart failure. Follow the plan that was created by you and your health care provider. Review your plan each time you visit your health care provider. Red zone These signs and symptoms mean you  should get medical help right away:  You have trouble breathing when resting.  You have a dry cough that is getting worse.  You have swelling or pain in your legs or abdomen that is getting worse.  You suddenly gain more than 2-3 lb (0.9-1.4 kg) in a day, or more than 5 lb (2.3 kg) in one week. This amount may be more or less depending on your condition.  You have trouble staying awake or you feel confused.  You have chest pain.  You do not have an appetite.  You pass out. If you experience any of these symptoms:  Call your local emergency services (911 in the [place] right away or seek help at the emergency department of the nearest hospital. Yellow zone These signs and symptoms mean your condition may be getting worse and you should make some changes:  You have trouble breathing when you are active or you need to sleep with extra pillows.  You have swelling in your legs or abdomen.  You gain 2-3 lb (0.9-1.4 kg) in one day, or 5 lb (2.3 kg) in one week. This amount may be more or less depending on your condition.  You get tired easily.  You have trouble sleeping.  You have a dry cough. If you experience any of these symptoms:  Contact your health care provider within the next day.  Your health care provider may adjust your medicines. Green zone These signs mean you are doing well and can continue what you are doing:  You do not have shortness of breath.  You have very little swelling or no new swelling.  Your weight is stable (no gain or loss).  You have a normal activity level.  You do not have chest pain  or any other new symptoms. Follow these instructions at home:  Take over-the-counter and prescription medicines only as told by your health care provider.  Weigh yourself daily. Your target weight is __________ lb (__________ kg). ? Call your health care provider if you gain more than __________ lb (__________ kg) in a day, or more than __________ lb (__________ kg) in one week.  Eat a heart-healthy diet. Work with a diet and nutrition specialist (dietitian) to create an eating plan that is best for you.  Keep all follow-up visits as told by your health care provider. This is important. Where to find more information  American Heart Association: www.heart.org Summary  Follow the action plan that was created by you and your health care provider.  Get help right away if you have any symptoms in the Red zone. This information is not intended to replace advice given to you by your health care provider. Make sure you discuss any questions you have with your health care provider. Document Released: 10/21/2016 Document Revised: 08/24/2017 Document Reviewed: 10/21/2016 [employer] Patient Education  2020 [place]. Heart Failure, Diagnosis  Heart failure is a condition in which the heart has trouble pumping blood because it has become weak or stiff. This means that the heart does not pump blood well enough for the body to stay healthy. For some people with heart failure, fluid may back up into the lungs. There may also be swelling (edema) in the lower legs. Heart failure is usually a long-term (chronic) condition. It is important for you to take good care of yourself and follow the treatment plan from your health care provider. What are the causes? This condition may be caused by:  High blood pressure (hypertension). Hypertension causes the  heart muscle to work harder than normal. This makes the heart stiff or weak.  Coronary artery disease, or CAD. CAD is the buildup of cholesterol and fat  (plaque) in the arteries of the heart.  Heart attack, also called myocardial infarction. This injures the heart muscle, making it hard for the heart to pump blood.  Abnormal heart valves. The valves do not open and close properly, forcing the heart to pump harder to keep the blood flowing.  Heart muscle disease (cardiomyopathy or myocarditis). This is damage to the heart muscle. It can increase the risk of heart failure.  Lung disease. The heart works harder when the lungs are not healthy.  Abnormal heart rhythms. These can lead to heart failure. What increases the risk? The risk of heart failure increases as a person ages. This condition is also more likely to develop in people who:  Are overweight.  Are male.  Smoke or chew tobacco.  Abuse alcohol or illegal drugs.  Have taken medicines that can damage the heart, such as chemotherapy drugs.  Have diabetes.  Have abnormal heart rhythms.  Have thyroid problems.  Have low blood counts (anemia). What are the signs or symptoms? Symptoms of this condition include:  Shortness of breath with activity, such as when climbing stairs.  A cough that does not go away.  Swelling of the feet, ankles, legs, or abdomen.  Losing weight for no reason.  Trouble breathing when lying flat (orthopnea).  Waking from sleep because of the need to sit up and get more air.  Rapid heartbeat.  Tiredness (fatigue) and loss of energy.  Feeling light-headed, dizzy, or close to fainting.  Loss of appetite.  Nausea.  Waking up more often during the night to urinate (nocturia).  Confusion. How is this diagnosed? This condition is diagnosed based on:  Your medical history, symptoms, and a physical exam.  Diagnostic tests, which may include: ? Echocardiogram. ? Electrocardiogram (ECG). ? Chest X-ray. ? Blood tests. ? Exercise stress test. ? Radionuclide scans. ? Cardiac catheterization and angiogram. How is this treated? Treatment  for this condition is aimed at managing the symptoms of heart failure. Medicines Treatment may include medicines that:  Help lower blood pressure by relaxing (dilating) the blood vessels. These medicines are called ACE inhibitors (angiotensin-converting enzyme) and ARBs (angiotensin receptor blockers).  Cause the kidneys to remove salt and water from the blood through urination (diuretics).  Improve heart muscle strength and prevent the heart from beating too fast (beta blockers).  Increase the force of the heartbeat (digoxin). Healthy behavior changes     Treatment may also include making healthy lifestyle changes, such as:  Reaching and staying at a healthy weight.  Quitting smoking or chewing tobacco.  Eating heart-healthy foods.  Limiting or avoiding alcohol.  Stopping the use of illegal drugs.  Being physically active.  Other treatments Other treatments may include:  Procedures to open blocked arteries or repair damaged valves.  Placing a pacemaker to improve heart function (cardiac resynchronization therapy).  Placing a device to treat serious abnormal heart rhythms (implantable cardioverter defibrillator, or ICD).  Placing a device to improve the pumping ability of the heart (left ventricular assist device, or LVAD).  Receiving a healthy heart from a donor (heart transplant). This is done when other treatments have not helped. Follow these instructions at home:  Manage other health conditions as told by your health care provider. These may include hypertension, diabetes, thyroid disease, or abnormal heart rhythms.  Get ongoing education and  support as needed. Learn as much as you can about heart failure.  Keep all follow-up visits as told by your health care provider. This is important. Summary  Heart failure is a condition in which the heart has trouble pumping blood because it has become weak or stiff.  This condition is caused by high blood pressure and  other diseases of the heart and lungs.  Symptoms of this condition include shortness of breath, tiredness (fatigue), nausea, and swelling of the feet, ankles, legs, or abdomen.  Treatments for this condition may include medicines, lifestyle changes, and surgery.  Manage other health conditions as told by your health care provider. This information is not intended to replace advice given to you by your health care provider. Make sure you discuss any questions you have with your health care provider. Document Released: 09/11/2005 Document Revised: 11/29/2018 Document Reviewed: 11/29/2018 [employer] Patient Education  [place].

## 2019-04-04 NOTE — Care Management Important Message (Signed)
Important Message  Patient Details  Name: [name] MRN: [id number] Date of Birth: Feb 22, 1941   Medicare Important Message Given:  Yes     Shelda Altes 04/04/2019, 1:24 PM

## 2019-04-04 NOTE — Discharge Summary (Signed)
Physician Discharge Summary  [name] NID:[id number] DOB: 1941/05/16 DOA: 04/01/2019  PCP: Prince Solian, MD  Admit date: 04/01/2019 Discharge date: 04/04/2019  Admitted From: Home Disposition:  Home  Recommendations for Outpatient Follow-up:  1. Follow up with PCP in 1-2 weeks 2. Follow-up with cardiology, Heart Failure Team as scheduled on 04/18/2019 3. CHF medication regimen recommended by cardiology, torsemide 40 mg p.o. daily, Coreg 3.125 mg p.o. twice daily, BiDil 1 tablet 3 times daily, spironolactone 12.5 mg p.o. twice daily, Plavix 75 mg p.o. daily, aspirin 81 mg p.o. daily.    Home Health: PT recommended home health PT on discharge, patient has declined Equipment/Devices: None  Discharge Condition: Stable CODE STATUS: DNR Diet recommendation: Heart Healthy / Carb Modified   History of present illness:  Peter Rogersis a 78 y.o.malewith medical history significant of2 with chronic kidney disease stage III, congestive heart failure with EF of 15 to 20%, coronary artery disease and operable, who presents from home with multiple concerns. Has a rash with several pustules on his chest by ED to be related to zoster and started on valacyclovir, also was noted to have a tick on his neck which was removed with no evidence of rash at the present time, but more significantly complains of shortness of breath that has been present since discharge in May. His shortness of breath has been worse recently. Discharged from our facility after admission May 12 through eighth. He underwent cardiac catheterization was found to have coronary disease suggesting need for CABG. Was evaluated by Dr. Darcey Nora and felt not to be a candidate due to degree of medical illnesses. His EF is 15 to 20% at that evaluation. Since discharge from the hospital he has had continuing shortness of breath. He saw the nephrologist who recommended stopping metformin but did not order it stopped referred him back to  his primary but also stopped his BiDil. He then saw his cardiology NP who recommended restarting the BiDil. This has been very confusing for the patient and his wife I do not know which is the best approach to take regarding his medications. 1 week after discharge the patient did have an episode of diaphoresis and after that felt that his lungs were congested. He feels that if he coughs very hard he is able to get some gray sputum but not much on a daily basis. He has had no fevers, nausea, vomiting, change in appetite, body aches, or sweating. He has had continued bilateral lower extremity edema that has been present since his last hospitalization and is unchanged. He is concerned about orthopnea and some PND. He denies any chest pain. Does not use any home oxygen or have a history of lung disease.  ED Course:Tick removed, medications ordered for suspected shingles, BNP markedly elevated 1814 (previously 465), creatinine 2.06 (stable) glucose 197 (average for him) potassium 4.8, chest x-ray shows left-sided pneumonia. Paucity of symptoms associated with pneumonia therefore noncontrast CT scan of the chest was ordered.  Hospital course:  Acute respiratory failure with hypoxia Patient presents with shortness of breath and hypoxia.  Etiology likely acute on chronic congestive heart failure with volume overload versus community-acquired pneumonia.    COVID-19 test negative.  Patient initially requiring 5 L of supplemental oxygen, at home is on room air.  Patient was started on antibiotics with azithromycin and ceftriaxone and IV diuresis with resolution of his respiratory failure.   Community-acquired pneumonia Chest x-ray with left greater than right pleural effusions and worsening airspace disease, infiltrate versus atelectasis.  CT chest with moderate bilateral pleural effusions, atelectasis and upper lobe airspace disease consistent with congestion versus pneumonia.  WBC 6.0, afebrile.   Lactic acid 1.3. Pro-calcitonin less than 0.10.  COVID-19 test negative.  Patient initially started on antibiotics with azithromycin and ceftriaxone.  We will continue azithromycin outpatient to complete a 5-day course and cefdinir to complete a 7-day course.  Acute on chronic combined systolic/diastolic congestive heart failure BNP elevated 1814.  Echo from May 2020 with EF 30-86%, grade 3 diastolic dysfunction with diffuse hypokinesis.  Underwent heart catheterization 02/07/2019 with likely high-grade distal LM lesion.  Previously reviewed by cardiothoracic surgery for intervention, given his advanced age, frailty and renal disease not felt to be a CABG candidate and lesion not amenable to stenting. Cardiology/advanced heart failure team following, appreciate assistance.  Patient was diuresed aggressively with furosemide.  Patient was titrated off of supplemental oxygen.  His losartan was discontinued secondary to his renal insufficiency.  Per cardiology, patient will discharge home on torsemide 40 mg p.o. daily, Coreg 3.125 mg p.o. twice daily, BiDil 1 tablet 3 times daily, spironolactone 12.5 mg p.o. daily, Plavix 75 mg p.o. daily, and aspirin 81 mg p.o. daily.  Patient to follow-up with heart failure team as scheduled on 04/18/2019.  Patient encouraged to maintain daily weights and bring log to next PCP visit.  Patient also educated on low-salt diet.  Questionable shingles Patient with reported shingles by admitting physician.  On physical exam, regions of concern on lower flank not consistent with active shingles infection.  Single lesion noted, no crusting or blister formation; likely related to a bug bite. Continue Valtrex 1000 mg p.o. daily for 7-day course.  CKD stage IIIb Nephrology following, appreciate assistance.  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  Creatinine appears to be at baseline.  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   Creatinine 1.95 at time of discharge.  CAD Heart catheterization Feb 07, 2019 with likely high-grade distal LM lesion.  Not amenable to stenting or coronary revascularization per cardiothoracic surgery.  Continue statin, aspirin, Plavix.  Hx CVA: Continue aspirin, Plavix, statin  Type 2 diabetes mellitus HbA1c 7.1 on 03/27/2016.    Continue home metformin 1000 mg p.o. daily and glimepiride 0.5mg  p.o. twice daily.  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   Complete by: As directed    Call MD for:  difficulty breathing, headache or visual disturbances   Complete by: As directed    Call MD for:  extreme fatigue   Complete by: As directed    Call MD for:  persistant dizziness or light-headedness   Complete by: As directed    Call MD for:  persistant nausea and vomiting   Complete by: As directed    Call MD for:  severe uncontrolled pain   Complete by: As directed    Call MD for:  temperature >100.4   Complete by: As directed    Diet - low sodium heart  healthy   Complete by: As directed    Increase activity slowly   Complete by: As directed      Allergies as of 04/04/2019   No Known Allergies     Medication List    STOP taking these medications   furosemide 80 MG tablet Commonly known as: LASIX     TAKE these medications   aspirin 81 MG EC tablet Take 1 tablet (81 mg total) by mouth daily.   azithromycin 250 MG tablet Commonly known as: ZITHROMAX Take 1 tablet (250 mg total) by mouth daily for 2 days.   carvedilol 3.125 MG tablet Commonly known as: COREG Take 1 tablet (3.125 mg total) by mouth 2 (two) times daily. What changed:   medication strength  how much to take   cefdinir 300 MG capsule Commonly known as: OMNICEF Take 1 capsule (300 mg total) by mouth 2 (two) times daily for 4 days.   clopidogrel 75 MG tablet Commonly known as: PLAVIX Take 75 mg by mouth daily.   FERREX 150 PO Take by mouth daily.   glimepiride 1 MG tablet Commonly known as: AMARYL Take 0.5 mg by mouth 2 (two) times daily.   isosorbide-hydrALAZINE 20-37.5 MG tablet Commonly known as: BIDIL Take 1 tablet by mouth 3 (three) times daily.   metFORMIN 1000 MG tablet Commonly known as: GLUCOPHAGE Take 1,000 mg by mouth daily. At 5:00 pm   simvastatin 40 MG tablet Commonly known as: ZOCOR Take 40 mg by mouth daily.   spironolactone 25 MG tablet Commonly known as: ALDACTONE Take 0.5 tablets (12.5 mg total) by mouth daily.   torsemide 20 MG tablet Commonly known as: Demadex Take 2 tablets (40 mg total) by mouth daily.   valACYclovir 1000 MG tablet Commonly known as: VALTREX Take 1 tablet (1,000 mg total) by mouth daily for 4 days. Start taking on: April 05, 2019      Follow-up Information    [facility] Follow up on 04/18/2019.   Specialty: Cardiology Why: CHF hospital follow up 04/18/19 @ 1:40PM -Parking  underneath [place] [address] in the [place] on [place] (garage  code:[id number] , elevator to 1st floor).  -Take all am meds and bring all med  Contact information: [address] [phone] [place]       Prince Solian, MD. Call in 1 week(s).   Specialty: Internal Medicine Contact information: [place] [address] [phone]        Bensimhon, Shaune Pascal, MD .   Specialty: Cardiology Contact information: [address] [place] [address] [phone]          No Known Allergies  Consultations:  Cardiology, [name]   Procedures/Studies: Dg Chest 2 View  Result Date: 04/03/2019 CLINICAL DATA:  Shortness of breath, CHF EXAM: CHEST - 2 VIEW COMPARISON:  04/01/2019 FINDINGS: Upper normal heart size with minimal pulmonary vascular congestion. Mediastinal contours normal. Atherosclerotic calcification aorta. Improved infiltrate LEFT lower  lobe. Central peribronchial thickening. Small LEFT pleural effusion. No additional segmental consolidation or pneumothorax. Bones unremarkable. IMPRESSION: Improved LEFT lower lobe pneumonia with small parapneumonic LEFT pleural effusion. Electronically Signed   By: Lavonia Dana M.D.   On: 04/03/2019 07:54   Ct Chest Wo Contrast  Result Date: 04/01/2019 CLINICAL DATA:  Hypoxia, pneumonia versus CHF. Interstitial lung disease. EXAM: CT CHEST WITHOUT CONTRAST TECHNIQUE: Multidetector CT imaging of the chest was performed following the standard protocol without IV contrast. COMPARISON:  02/05/2019 FINDINGS: Cardiovascular: Cardiomegaly without pericardial effusion. Extensive coronary calcification. There is also aortic atherosclerotic calcification. No acute vascular finding without contrast. Mediastinum/Nodes: Mild prominence of mediastinal lymph nodes that is likely congestive. No worrisome adenopathy. Negative esophagus. Lungs/Pleura: Moderate layering pleural effusions with patchy atelectasis. There is also ground-glass  opacity in the primarily apically lungs. This same pattern was seen on 02/05/2019 chest CT, although the airspace opacity in the upper lobes is greater today. Inferior lingular segment was also collapsed on prior, but the central airways appear patent. Negative coronavirus in the chart today. Upper Abdomen: No acute abnormality. Musculoskeletal: No acute or aggressive finding. Symmetric gynecomastia IMPRESSION: Moderate bilateral pleural effusion, atelectasis, and upper lobe airspace disease. This same pattern was seen on 02/05/2019 chest CT and may be congestive, although need correlation for pneumonia as the airspace opacities are patchy and asymmetric. Electronically Signed   By: Monte Fantasia M.D.   On: 04/01/2019 14:54   Dg Chest Portable 1 View  Result Date: 04/01/2019 CLINICAL DATA:  Shortness of breath and congestion for 2 months. EXAM: PORTABLE CHEST 1 VIEW COMPARISON:  CT chest 02/05/2019. Plain film of the chest 02/27/2016, 02/04/2019 and 02/08/2019. FINDINGS: Left worse than right layering pleural effusions and airspace disease have worsened since the most recent examination. Heart size is upper normal. Atherosclerosis noted. No pneumothorax. IMPRESSION: Left worse than right layering effusions and airspace disease which could be atelectasis or pneumonia worsened since the most recent examination. Atherosclerosis. Electronically Signed   By: Inge Rise M.D.   On: 04/01/2019 08:13      Subjective: Patient seen and examined at bedside, eating breakfast.  No complaints this morning.  Feels breathing is back at his normal baseline.  Continues with good urine output.  No other complaints at this time.  Ready for discharge home.  Denies headache, no fever/chills/night sweats, no nausea/vomiting/diarrhea, no chest pain, no palpitations, no shortness of breath, no abdominal pain, no cough/congestion, no weakness, no paresthesias.  Discussed with patient about PT recommendations of home health  therapy, he respectively declines.  No acute events overnight per nurse staff.   Discharge Exam: Vitals:   04/03/19 2203 04/04/19 0409  BP: 127/78 104/63  Pulse: 88 78  Resp:  18  Temp:  97.9 F (36.6 C)  SpO2:  94%   Vitals:   04/03/19 2029 04/03/19 2203 04/04/19 0409 04/04/19 0414  BP: 90/65 127/78 104/63   Pulse: 81 88 78   Resp: 19  18   Temp: 97.9 F (36.6 C)  97.9 F (36.6 C)   TempSrc: Oral  Oral   SpO2: 96%  94%   Weight:    66.4 kg  Height:        General: Pt is alert, awake, not in acute distress, frail in appearance Cardiovascular: RRR, S1/S2 +, no rubs, no gallops Respiratory: CTA bilaterally, no wheezing, no rhonchi Abdominal: Soft, NT, ND, bowel sounds + Extremities: no edema, no cyanosis    The results of significant diagnostics from this  hospitalization (including imaging, microbiology, ancillary and laboratory) are listed below for reference.     Microbiology: Recent Results (from the past 240 hour(s))  Blood culture (routine x 2)     Status: None (Preliminary result)   Collection Time: 04/01/19  8:45 AM   Specimen: BLOOD RIGHT FOREARM  Result Value Ref Range Status   Specimen Description BLOOD RIGHT FOREARM  Final   Special Requests   Final    BOTTLES DRAWN AEROBIC ONLY Blood Culture results may not be optimal due to an inadequate volume of blood received in culture bottles   Culture   Final    NO GROWTH 2 DAYS Performed at [facility], [place] [address] [place], [place] [address]    Report Status PENDING  Incomplete  Blood culture (routine x 2)     Status: None (Preliminary result)   Collection Time: 04/01/19  8:45 AM   Specimen: BLOOD  Result Value Ref Range Status   Specimen Description BLOOD LEFT ANTECUBITAL  Final   Special Requests   Final    BOTTLES DRAWN AEROBIC AND ANAEROBIC Blood Culture adequate volume   Culture   Final    NO GROWTH 2 DAYS Performed at [facility], [place] [address] [place], [place] [address]     Report Status PENDING  Incomplete  SARS Coronavirus 2 (CEPHEID- Performed in [facility] hospital lab), Hosp Order     Status: None   Collection Time: 04/01/19  9:12 AM   Specimen: Nasopharyngeal Swab  Result Value Ref Range Status   SARS Coronavirus 2 NEGATIVE NEGATIVE Final    Comment: (NOTE) If result is NEGATIVE SARS-CoV-2 target nucleic acids are NOT DETECTED. The SARS-CoV-2 RNA is generally detectable in upper and lower  respiratory specimens during the acute phase of infection. The lowest  concentration of SARS-CoV-2 viral copies this assay can detect is 250  copies / mL. A negative result does not preclude SARS-CoV-2 infection  and should not be used as the sole basis for treatment or other  patient management decisions.  A negative result may occur with  improper specimen collection / handling, submission of specimen other  than nasopharyngeal swab, presence of viral mutation(s) within the  areas targeted by this assay, and inadequate number of viral copies  (<250 copies / mL). A negative result must be combined with clinical  observations, patient history, and epidemiological information. If result is POSITIVE SARS-CoV-2 target nucleic acids are DETECTED. The SARS-CoV-2 RNA is generally detectable in upper and lower  respiratory specimens dur ing the acute phase of infection.  Positive  results are indicative of active infection with SARS-CoV-2.  Clinical  correlation with patient history and other diagnostic information is  necessary to determine patient infection status.  Positive results do  not rule out bacterial infection or co-infection with other viruses. If result is PRESUMPTIVE POSTIVE SARS-CoV-2 nucleic acids MAY BE PRESENT.   A presumptive positive result was obtained on the submitted specimen  and confirmed on repeat testing.  While 2019 novel coronavirus  (SARS-CoV-2) nucleic acids may be present in the submitted sample  additional confirmatory testing may be  necessary for epidemiological  and / or clinical management purposes  to differentiate between  SARS-CoV-2 and other Sarbecovirus currently known to infect humans.  If clinically indicated additional testing with an alternate test  methodology [phone]) is advised. The SARS-CoV-2 RNA is generally  detectable in upper and lower respiratory sp ecimens during the acute  phase of infection. The  expected result is Negative. Fact Sheet for Patients:  StrictlyIdeas.no Fact Sheet for Healthcare Providers: BankingDealers.co.za This test is not yet approved or cleared by the [place] FDA and has been authorized for detection and/or diagnosis of SARS-CoV-2 by FDA under an Emergency Use Authorization (EUA).  This EUA will remain in effect (meaning this test can be used) for the duration of the COVID-19 declaration under Section 564(b)(1) of the Act, 21 U.S.C. section 360bbb-3(b)(1), unless the authorization is terminated or revoked sooner. Performed at [facility], [place] [address] [place], [place] [address]      Labs: BNP (last 3 results) Recent Labs    02/19/19 1023 04/01/19 0854 04/02/19 0708  BNP 465.4* 1,814.5* 1,723.5*   Basic Metabolic Panel: Recent Labs  Lab 04/01/19 0854 04/02/19 0708 04/03/19 0447 04/04/19 0428  NA 139 140 134* 137  K 4.8 4.5 4.2 4.2  CL 112* 106 102 103  CO2 19* 22 22 24   GLUCOSE 197* 143* 191* 192*  BUN 44* 43* 45* 44*  CREATININE 2.06* 2.07* 2.12* 1.95*  CALCIUM 8.9 9.0 8.2* 8.4*  MG  --  2.1  --   --    Liver Function Tests: Recent Labs  Lab 04/01/19 0854  AST 18  ALT 24  ALKPHOS 65  BILITOT 0.6  PROT 6.0*  ALBUMIN 3.4*   No results for input(s): LIPASE, AMYLASE in the last 168 hours. No results for input(s): AMMONIA in the last 168 hours. CBC: Recent Labs  Lab 04/01/19 0854  WBC 6.0  NEUTROABS 4.0  HGB 11.4*  HCT 36.1*  MCV 95.5  PLT 214   Cardiac Enzymes: No results  for input(s): CKTOTAL, CKMB, CKMBINDEX, TROPONINI in the last 168 hours. BNP: Invalid input(s): POCBNP CBG: Recent Labs  Lab 04/03/19 0806 04/03/19 1108 04/03/19 1639 04/03/19 2106 04/04/19 0804  GLUCAP 155* 234* 237* 116* 221*   D-Dimer No results for input(s): DDIMER in the last 72 hours. Hgb A1c No results for input(s): HGBA1C in the last 72 hours. Lipid Profile No results for input(s): CHOL, HDL, LDLCALC, TRIG, CHOLHDL, LDLDIRECT in the last 72 hours. Thyroid function studies No results for input(s): TSH, T4TOTAL, T3FREE, THYROIDAB in the last 72 hours.  Invalid input(s): FREET3 Anemia work up No results for input(s): VITAMINB12, FOLATE, FERRITIN, TIBC, IRON, RETICCTPCT in the last 72 hours. Urinalysis    Component Value Date/Time   COLORURINE YELLOW 02/04/2019 1930   APPEARANCEUR CLEAR 02/04/2019 1930   LABSPEC 1.019 02/04/2019 1930   PHURINE 5.0 02/04/2019 1930   GLUCOSEU NEGATIVE 02/04/2019 1930   HGBUR NEGATIVE 02/04/2019 1930   BILIRUBINUR NEGATIVE 02/04/2019 [place] NEGATIVE 02/04/2019 1930   PROTEINUR 100 (A) 02/04/2019 1930   UROBILINOGEN 0.2 01/23/2014 1600   NITRITE NEGATIVE 02/04/2019 1930   LEUKOCYTESUR NEGATIVE 02/04/2019 1930   Sepsis Labs Invalid input(s): PROCALCITONIN,  WBC,  LACTICIDVEN Microbiology Recent Results (from the past 240 hour(s))  Blood culture (routine x 2)     Status: None (Preliminary result)   Collection Time: 04/01/19  8:45 AM   Specimen: BLOOD RIGHT FOREARM  Result Value Ref Range Status   Specimen Description BLOOD RIGHT FOREARM  Final   Special Requests   Final    BOTTLES DRAWN AEROBIC ONLY Blood Culture results may not be optimal due to an inadequate volume of blood received in culture bottles   Culture   Final    NO GROWTH 2 DAYS Performed at [facility], [place],  [place] [address]    Report Status PENDING  Incomplete  Blood culture (routine x 2)     Status: None (Preliminary result)    Collection Time: 04/01/19  8:45 AM   Specimen: BLOOD  Result Value Ref Range Status   Specimen Description BLOOD LEFT ANTECUBITAL  Final   Special Requests   Final    BOTTLES DRAWN AEROBIC AND ANAEROBIC Blood Culture adequate volume   Culture   Final    NO GROWTH 2 DAYS Performed at [facility], [place] [address], [place], [place] [address]    Report Status PENDING  Incomplete  SARS Coronavirus 2 (CEPHEID- Performed in [facility] hospital lab), Hosp Order     Status: None   Collection Time: 04/01/19  9:12 AM   Specimen: Nasopharyngeal Swab  Result Value Ref Range Status   SARS Coronavirus 2 NEGATIVE NEGATIVE Final    Comment: (NOTE) If result is NEGATIVE SARS-CoV-2 target nucleic acids are NOT DETECTED. The SARS-CoV-2 RNA is generally detectable in upper and lower  respiratory specimens during the acute phase of infection. The lowest  concentration of SARS-CoV-2 viral copies this assay can detect is 250  copies / mL. A negative result does not preclude SARS-CoV-2 infection  and should not be used as the sole basis for treatment or other  patient management decisions.  A negative result may occur with  improper specimen collection / handling, submission of specimen other  than nasopharyngeal swab, presence of viral mutation(s) within the  areas targeted by this assay, and inadequate number of viral copies  (<250 copies / mL). A negative result must be combined with clinical  observations, patient history, and epidemiological information. If result is POSITIVE SARS-CoV-2 target nucleic acids are DETECTED. The SARS-CoV-2 RNA is generally detectable in upper and lower  respiratory specimens dur ing the acute phase of infection.  Positive  results are indicative of active infection with SARS-CoV-2.  Clinical  correlation with patient history and other diagnostic information is  necessary to determine patient infection status.  Positive results do  not rule out bacterial  infection or co-infection with other viruses. If result is PRESUMPTIVE POSTIVE SARS-CoV-2 nucleic acids MAY BE PRESENT.   A presumptive positive result was obtained on the submitted specimen  and confirmed on repeat testing.  While 2019 novel coronavirus  (SARS-CoV-2) nucleic acids may be present in the submitted sample  additional confirmatory testing may be necessary for epidemiological  and / or clinical management purposes  to differentiate between  SARS-CoV-2 and other Sarbecovirus currently known to infect humans.  If clinically indicated additional testing with an alternate test  methodology [phone]) is advised. The SARS-CoV-2 RNA is generally  detectable in upper and lower respiratory sp ecimens during the acute  phase of infection. The expected result is Negative. Fact Sheet for Patients:  StrictlyIdeas.no Fact Sheet for Healthcare Providers: BankingDealers.co.za This test is not yet approved or cleared by the [place] FDA and has been authorized for detection and/or diagnosis of SARS-CoV-2 by FDA under an Emergency Use Authorization (EUA).  This EUA will remain in effect (meaning this test can be used) for the duration of the COVID-19 declaration under Section 564(b)(1) of the Act, 21 U.S.C. section 360bbb-3(b)(1), unless the authorization is terminated or revoked sooner. Performed at [facility], [place] [address] [place], [place] [address]      Time coordinating discharge: Over 30 minutes  SIGNED:   Donnamarie Poag [place] DO  [facility] 04/04/2019, 10:09 AM

## 2019-04-04 NOTE — Consult Note (Signed)
   [facility] CM Inpatient Consult   04/04/2019  Ronen Bromwell Apr 13, 1941 [id number]  Call received from inpatient [facility] RNCM regarding post hospital follow up needs for home based palliative programs. Thank you for the referral. We have reviewed your referral request and this patient is eleigile for [facility] Care Management programs.  Patient with [facility] Advantage in the [place].  For additional questions or needs. Please contact:  Natividad Brood, RN BSN [facility] Liaison  [phone] business mobile phone Toll free office [phone]  Fax number: [phone] [email or web address]

## 2019-04-05 LAB — HEMOGLOBIN A1C
Hgb A1c MFr Bld: 7.2 % — ABNORMAL HIGH (ref 4.8–5.6)
Mean Plasma Glucose: 160 mg/dL

## 2019-04-06 LAB — CULTURE, BLOOD (ROUTINE X 2)
Culture: NO GROWTH
Culture: NO GROWTH
Special Requests: ADEQUATE

## 2019-04-10 DIAGNOSIS — I5021 Acute systolic (congestive) heart failure: Secondary | ICD-10-CM | POA: Diagnosis not present

## 2019-04-10 DIAGNOSIS — Z66 Do not resuscitate: Secondary | ICD-10-CM | POA: Diagnosis not present

## 2019-04-10 DIAGNOSIS — E1129 Type 2 diabetes mellitus with other diabetic kidney complication: Secondary | ICD-10-CM | POA: Diagnosis not present

## 2019-04-10 DIAGNOSIS — J189 Pneumonia, unspecified organism: Secondary | ICD-10-CM | POA: Diagnosis not present

## 2019-04-10 DIAGNOSIS — I69959 Hemiplegia and hemiparesis following unspecified cerebrovascular disease affecting unspecified side: Secondary | ICD-10-CM | POA: Diagnosis not present

## 2019-04-10 DIAGNOSIS — E785 Hyperlipidemia, unspecified: Secondary | ICD-10-CM | POA: Diagnosis not present

## 2019-04-10 DIAGNOSIS — N183 Chronic kidney disease, stage 3 (moderate): Secondary | ICD-10-CM | POA: Diagnosis not present

## 2019-04-10 DIAGNOSIS — I25118 Atherosclerotic heart disease of native coronary artery with other forms of angina pectoris: Secondary | ICD-10-CM | POA: Diagnosis not present

## 2019-04-10 DIAGNOSIS — I130 Hypertensive heart and chronic kidney disease with heart failure and stage 1 through stage 4 chronic kidney disease, or unspecified chronic kidney disease: Secondary | ICD-10-CM | POA: Diagnosis not present

## 2019-04-10 DIAGNOSIS — J9601 Acute respiratory failure with hypoxia: Secondary | ICD-10-CM | POA: Diagnosis not present

## 2019-04-17 DIAGNOSIS — J449 Chronic obstructive pulmonary disease, unspecified: Secondary | ICD-10-CM | POA: Diagnosis not present

## 2019-04-17 DIAGNOSIS — E785 Hyperlipidemia, unspecified: Secondary | ICD-10-CM | POA: Diagnosis not present

## 2019-04-17 DIAGNOSIS — N189 Chronic kidney disease, unspecified: Secondary | ICD-10-CM | POA: Diagnosis not present

## 2019-04-17 DIAGNOSIS — I2510 Atherosclerotic heart disease of native coronary artery without angina pectoris: Secondary | ICD-10-CM | POA: Diagnosis not present

## 2019-04-17 DIAGNOSIS — H3500 Unspecified background retinopathy: Secondary | ICD-10-CM | POA: Diagnosis not present

## 2019-04-17 DIAGNOSIS — E1122 Type 2 diabetes mellitus with diabetic chronic kidney disease: Secondary | ICD-10-CM | POA: Diagnosis not present

## 2019-04-17 DIAGNOSIS — D631 Anemia in chronic kidney disease: Secondary | ICD-10-CM | POA: Diagnosis not present

## 2019-04-17 DIAGNOSIS — I509 Heart failure, unspecified: Secondary | ICD-10-CM | POA: Diagnosis not present

## 2019-04-17 DIAGNOSIS — N2581 Secondary hyperparathyroidism of renal origin: Secondary | ICD-10-CM | POA: Diagnosis not present

## 2019-04-17 DIAGNOSIS — N183 Chronic kidney disease, stage 3 (moderate): Secondary | ICD-10-CM | POA: Diagnosis not present

## 2019-04-17 DIAGNOSIS — E875 Hyperkalemia: Secondary | ICD-10-CM | POA: Diagnosis not present

## 2019-04-17 DIAGNOSIS — E1129 Type 2 diabetes mellitus with other diabetic kidney complication: Secondary | ICD-10-CM | POA: Diagnosis not present

## 2019-04-18 ENCOUNTER — Ambulatory Visit (HOSPITAL_COMMUNITY)
Admission: RE | Admit: 2019-04-18 | Discharge: 2019-04-18 | Disposition: A | Discharge status: Home / Self Care | Payer: Medicare HMO | Source: Ambulatory Visit | Attending: Internal Medicine | Admitting: Internal Medicine

## 2019-04-18 ENCOUNTER — Encounter (HOSPITAL_COMMUNITY): Payer: Self-pay | Admitting: Internal Medicine

## 2019-04-18 ENCOUNTER — Other Ambulatory Visit: Payer: Self-pay

## 2019-04-18 VITALS — BP 121/63 | HR 73 | Wt 145.0 lb

## 2019-04-18 DIAGNOSIS — R05 Cough: Secondary | ICD-10-CM | POA: Diagnosis not present

## 2019-04-18 DIAGNOSIS — R0602 Shortness of breath: Secondary | ICD-10-CM | POA: Diagnosis not present

## 2019-04-18 DIAGNOSIS — E1151 Type 2 diabetes mellitus with diabetic peripheral angiopathy without gangrene: Secondary | ICD-10-CM | POA: Insufficient documentation

## 2019-04-18 DIAGNOSIS — I428 Other cardiomyopathies: Secondary | ICD-10-CM | POA: Insufficient documentation

## 2019-04-18 DIAGNOSIS — Z7984 Long term (current) use of oral hypoglycemic drugs: Secondary | ICD-10-CM | POA: Diagnosis not present

## 2019-04-18 DIAGNOSIS — Z8249 Family history of ischemic heart disease and other diseases of the circulatory system: Secondary | ICD-10-CM | POA: Diagnosis not present

## 2019-04-18 DIAGNOSIS — Z79899 Other long term (current) drug therapy: Secondary | ICD-10-CM | POA: Insufficient documentation

## 2019-04-18 DIAGNOSIS — E785 Hyperlipidemia, unspecified: Secondary | ICD-10-CM | POA: Diagnosis not present

## 2019-04-18 DIAGNOSIS — N183 Chronic kidney disease, stage 3 (moderate): Secondary | ICD-10-CM | POA: Diagnosis not present

## 2019-04-18 DIAGNOSIS — I5022 Chronic systolic (congestive) heart failure: Secondary | ICD-10-CM

## 2019-04-18 DIAGNOSIS — I4891 Unspecified atrial fibrillation: Secondary | ICD-10-CM | POA: Diagnosis not present

## 2019-04-18 DIAGNOSIS — Z7902 Long term (current) use of antithrombotics/antiplatelets: Secondary | ICD-10-CM | POA: Diagnosis not present

## 2019-04-18 DIAGNOSIS — I509 Heart failure, unspecified: Secondary | ICD-10-CM | POA: Insufficient documentation

## 2019-04-18 DIAGNOSIS — I2510 Atherosclerotic heart disease of native coronary artery without angina pectoris: Secondary | ICD-10-CM | POA: Diagnosis not present

## 2019-04-18 DIAGNOSIS — I447 Left bundle-branch block, unspecified: Secondary | ICD-10-CM | POA: Diagnosis not present

## 2019-04-18 DIAGNOSIS — I130 Hypertensive heart and chronic kidney disease with heart failure and stage 1 through stage 4 chronic kidney disease, or unspecified chronic kidney disease: Secondary | ICD-10-CM | POA: Insufficient documentation

## 2019-04-18 DIAGNOSIS — E1122 Type 2 diabetes mellitus with diabetic chronic kidney disease: Secondary | ICD-10-CM | POA: Diagnosis not present

## 2019-04-18 DIAGNOSIS — Z7982 Long term (current) use of aspirin: Secondary | ICD-10-CM | POA: Insufficient documentation

## 2019-04-18 DIAGNOSIS — Z833 Family history of diabetes mellitus: Secondary | ICD-10-CM | POA: Diagnosis not present

## 2019-04-18 DIAGNOSIS — Z8673 Personal history of transient ischemic attack (TIA), and cerebral infarction without residual deficits: Secondary | ICD-10-CM | POA: Insufficient documentation

## 2019-04-18 DIAGNOSIS — Z87891 Personal history of nicotine dependence: Secondary | ICD-10-CM | POA: Diagnosis not present

## 2019-04-18 MED ORDER — BIDIL 20-37.5 MG PO TABS: 0.5000 | ORAL_TABLET | Freq: Three times a day (TID) | ORAL | 6 refills | Status: DC

## 2019-04-18 MED ORDER — TORSEMIDE 20 MG PO TABS: 40.0000 mg | ORAL_TABLET | ORAL | 6 refills | Status: DC

## 2019-04-18 MED FILL — BIDIL TABLET: 20-37.5 | 30 days supply | Qty: 45 | Fill #0

## 2019-04-18 NOTE — Patient Instructions (Signed)
Chest xray to be done today. Office will notify you if abnormal  CHANGE how you take torsemide: HOLD for 2 days THEN restart at Torsemide 40mg  (2 tabs) every other day.   Your physician recommends that you schedule a follow-up appointment in: 1 month. You will get a call to schedule this appointment.   At the [facility], you and your health needs are our priority. As part of our continuing mission to provide you with exceptional heart care, we have created designated Provider Care Teams. These Care Teams include your primary Cardiologist (physician) and Advanced Practice Providers (APPs- Physician Assistants and Nurse Practitioners) who all work together to provide you with the care you need, when you need it.   You may see any of the following providers on your designated Care Team at your next follow up: Marland Kitchen Dr Glori Bickers . Dr Loralie Champagne . Darrick Grinder, NP

## 2019-04-18 NOTE — Progress Notes (Signed)
Per Dr Haroldine Laws, pt to restart BIDIL at 0.5 tab three times a day.  Called patients wife and made her aware of medication change.  Verbalized understanding.

## 2019-04-18 NOTE — Progress Notes (Signed)
PCP: Dr Dagmar Hait  Primary HF Cardiologist: Dr Haroldine Laws   HPI: [name] is a 78 y.o. male with a PMH of chronic combined CHF, non-ischemic cardiomyopathy, non-obstructive CAD on cath 2011, PAD s/p right fem-pop in 2017, DM type 2, HTN, HLD, stroke, CKD stage 3, tobacco abuse  Admitted with A/C biventricular HF and RUL PNA. Had RHC/LHC with volume overlaod and hig grad disttal LM lesion. CT surgery consulted. He was not thought to be surgery candidate due to given severe RV dysfunction, advanced age, fraility and CKD not felt to be CABG candidate. Case reviewed with Dr. Burt Knack with regard to possibility of LM stenting. It is highly calcified lesion and with severely reduced EF would need probable Impella support but PAD likely precludes. Additionally, LV was down prior to CAD so may not improve with stenting.Medical management was recommended.   Readmitted to [facility] earlier this month with worsening SOB. In reviewin CT I was initially more suspicious for multifocal PNA with mild HF overlap however now he looked more likely primarily HF. D/c weigh 146   Today he returns for HF follow up. Says did well for 2 weeks but now having a deep cough again. Mostly non-productive but occasionally with gray or dark mucous. Feels it is coming from bottom of L lung. No f/c. No f/c. Pleuritic pain with deep coughing. No edema, orthopnea or PND. Weight stable at 143.  Saw [name] yesterday who stopped Bidil and spiro. Creatinine 3.15 yesterday  RHC/LHC 02/07/2019   Mid RCA to Dist RCA lesion is 50% stenosed.  Prox RCA lesion is 40% stenosed.  Mid LM to Dist LM lesion is 80% stenosed.  RPDA lesion is 90% stenosed.  Ost Cx to Prox Cx lesion is 80% stenosed.  Dist LM to Ost LAD lesion is 60% stenosed.  Prox Cx to Mid Cx lesion is 50% stenosed.  Prox LAD to Mid LAD lesion is 70% stenosed.  Findings: Ao = 136/76 (97) LV = 139/28 RA = 9 RV = 65/10 PA = 67/29 (48) PCW = 36 Fick cardiac output/index =  4.1/2.2 PVR = 3.0 WU SVR = 1721 Assessment: 1. Severe 2v CAD with probable high grade, calcified distal left main lesion 2. EF 20-25% due to iCM 3. Elevated filling pressures with moderately reduced CO  Echo  EF 40-45% in 2015  EF 25-30% in 2016 EF 35-40% in 2017 EF 15-20% in 2020  Severer RV dysfunction.   ROS: All systems negative except as listed in HPI, PMH and Problem List.  SH:  Social History   Socioeconomic History  . Marital status: Married    Spouse name: Not on file  . Number of children: 3  . Years of education: Not on file  . Highest education level: Not on file  Occupational History  . Occupation: Retired  Scientific laboratory technician  . Financial resource strain: Not on file  . Food insecurity    Worry: Not on file    Inability: Not on file  . Transportation needs    Medical: Not on file    Non-medical: Not on file  Tobacco Use  . Smoking status: Former Smoker    Packs/day: 0.25    Years: 20.00    Pack years: 5.00    Types: Cigarettes  . Smokeless tobacco: Never Used  . Tobacco comment: Quit June 2017  Substance and Sexual Activity  . Alcohol use: Yes    Alcohol/week: 0.0 standard drinks    Comment: rarely  . Drug use: No  .  Sexual activity: Not on file  Lifestyle  . Physical activity    Days per week: Not on file    Minutes per session: Not on file  . Stress: Not on file  Relationships  . Social Herbalist on phone: Not on file    Gets together: Not on file    Attends religious service: Not on file    Active member of club or organization: Not on file    Attends meetings of clubs or organizations: Not on file    Relationship status: Not on file  . Intimate partner violence    Fear of current or ex partner: Not on file    Emotionally abused: Not on file    Physically abused: Not on file    Forced sexual activity: Not on file  Other Topics Concern  . Not on file  Social History Narrative  . Not on file    FH:  Family History  Problem  Relation Age of Onset  . Diabetes Mellitus II Mother   . Heart disease Brother        before age 9  . Colon cancer Neg Hx   . Esophageal cancer Neg Hx   . Stomach cancer Neg Hx   . Anesthesia problems Neg Hx   . Hypotension Neg Hx   . Malignant hyperthermia Neg Hx   . Pseudochol deficiency Neg Hx     Past Medical History:  Diagnosis Date  . Allergy   . Atrial fibrillation ([place])   . Cataract   . Chronic kidney disease    Renal Insuffiecny  . Colon polyps 2012   Colonoscopy  . Diabetes mellitus    type 2  . Diverticulosis 2012   Colonoscopy   . Hyperlipidemia   . Hypertension   . LBBB (left bundle branch block)   . Pneumonia 2015   3 times  . Stroke [facility]) 2015    Current Outpatient Medications  Medication Sig Dispense Refill  . aspirin EC 81 MG EC tablet Take 1 tablet (81 mg total) by mouth daily.    . carvedilol (COREG) 3.125 MG tablet Take 1 tablet (3.125 mg total) by mouth 2 (two) times daily. 60 tablet 0  . clopidogrel (PLAVIX) 75 MG tablet Take 75 mg by mouth daily.    Marland Kitchen glimepiride (AMARYL) 1 MG tablet Take 0.5 mg by mouth 2 (two) times daily.     . Polysaccharide Iron Complex (FERREX 150 PO) Take by mouth daily.    . simvastatin (ZOCOR) 40 MG tablet Take 40 mg by mouth daily.     Marland Kitchen torsemide (DEMADEX) 20 MG tablet Take 2 tablets (40 mg total) by mouth daily. 60 tablet 0  . isosorbide-hydrALAZINE (BIDIL) 20-37.5 MG tablet Take 1 tablet by mouth 3 (three) times daily. (Patient not taking: Reported on 04/18/2019) 90 tablet 0  . metFORMIN (GLUCOPHAGE) 1000 MG tablet Take 1,000 mg by mouth daily. At 5:00 pm    . spironolactone (ALDACTONE) 25 MG tablet Take 0.5 tablets (12.5 mg total) by mouth daily. (Patient not taking: Reported on 04/18/2019) 15 tablet 0   No current facility-administered medications for this encounter.       Vitals:   04/18/19 1333  BP: 121/63  Pulse: 73  SpO2: 93%  Weight: 65.8 kg (145 lb)   Wt Readings from Last 3 Encounters:  04/18/19  65.8 kg (145 lb)  04/04/19 66.4 kg (146 lb 6.4 oz)  03/03/19 71.8 kg (158 lb 3.2 oz)  PHYSICAL EXAM: General:  Elderly. No resp difficulty.  Wife present.  HEENT: normal Neck: supple. no JVD. Carotids 2+ bilat; no bruits. No lymphadenopathy or thryomegaly appreciated. Cor: PMI nondisplaced. Regular rate & rhythm. No rubs, gallops or murmurs. Lungs: clear with bronchial BS Abdomen: soft, nontender, nondistended. No hepatosplenomegaly. No bruits or masses. Good bowel sounds. Extremities: no cyanosis, clubbing, rash, edema Neuro: alert & orientedx3, cranial nerves grossly intact. moves all 4 extremities w/o difficulty. Affect pleasant   ASSESSMENT & PLAN: 1. Chronic biventricular MKL:[id number] with a history of non-ischemic cardiomyopathy with EF 25-30% in 2016, improved to 35-40% on echo in 2017.   - Echo  01/2019 revealed EF 15-20%, G3DD, and diffuse hypokinesis.  - likely iCM but LBBB may also be playing a role - NYHA III Volume status stable. Continue lasix 80 mg daily. - Continue carvedilol 6.25 mg twice a day. Not sure why this was stopped.   - Bidil stopped by Renal due to soft BP. I d/w [name]. Will restart Bidil 0.5 tab tid - Off spironolactone.due to CKD - LBBB may also be playing a role and need to consider CRT-D though he may be too far along.  - Plan to repeat ECHO in 3 months after HF meds optimized.    2. CAD: - Cath 02/07/19 with likely high-grade distal LM lesion.  - Case has been reviewed with Dr. Prescott Gum who has reviewed images. LAD only graftable vessel and given severe RV dysfunction, advanced age, fraility and CKD not felt to be CABG candidate. Case reviewed with Dr. Burt Knack with regard to possibility of LM stenting. It is highly calcified lesion and with severely reduced EF would need probable Impella support but PAD likely precludes. Additionally, LV was down prior to CAD so may not improve with stenting. I suspect medical management may be best.  - No s/s  ischemia - On ASA. Resume plavix - Continue statin   3.Cough - check CXR to assess for clearance of PNA  4.CKD3: - Creatinine running 1.9-2.1,  - He now follows with [name].   - Recheck today  5. History of CVA: - He is on ASA 81, Plavix, and statin.   6. DM type 2:on ISS - Per primary - May be candidate for SGLT2i if CrCl > 30  Glori Bickers  MD 1:51 PM

## 2019-04-26 DIAGNOSIS — R739 Hyperglycemia, unspecified: Secondary | ICD-10-CM

## 2019-04-26 HISTORY — DX: Hyperglycemia, unspecified: R73.9

## 2019-05-05 MED FILL — BIDIL TABLET: 20-37.5 | 30 days supply | Qty: 90 | Fill #0

## 2019-05-08 ENCOUNTER — Telehealth (HOSPITAL_COMMUNITY): Payer: Self-pay | Admitting: Licensed Clinical Social Worker

## 2019-05-08 ENCOUNTER — Other Ambulatory Visit (HOSPITAL_COMMUNITY): Payer: Self-pay

## 2019-05-08 ENCOUNTER — Other Ambulatory Visit (HOSPITAL_COMMUNITY): Payer: Self-pay | Admitting: *Deleted

## 2019-05-08 MED ORDER — CARVEDILOL 3.125 MG PO TABS: 3.1250 mg | ORAL_TABLET | Freq: Two times a day (BID) | ORAL | 6 refills | Status: DC

## 2019-05-08 MED ORDER — CARVEDILOL 3.125 MG PO TABS: 3.1250 mg | ORAL_TABLET | Freq: Two times a day (BID) | ORAL | 3 refills | Status: DC

## 2019-05-08 MED FILL — CARVEDILOL 3.125 MG TABLET: 3.125 | 30 days supply | Qty: 60 | Fill #0

## 2019-05-08 NOTE — Telephone Encounter (Signed)
CSW received call from pt wife informing that pt is out of carvedilol and that they have been unable to get a hold of someone at the office to refill the prescription.    CSW called clinic RN and requested prescription be sent in and updated wife that we would be sending in the refill  CSW will continue to follow through clinic and assist as needed  Jorge Ny, [place] Worker [facility] Desk#: [phone] Cell#: [phone]

## 2019-05-21 ENCOUNTER — Encounter (HOSPITAL_COMMUNITY): Payer: Self-pay | Admitting: *Deleted

## 2019-05-21 ENCOUNTER — Other Ambulatory Visit: Payer: Self-pay

## 2019-05-21 ENCOUNTER — Inpatient Hospital Stay (HOSPITAL_COMMUNITY)
Admission: EM | Admit: 2019-05-21 | Discharge: 2019-05-23 | DRG: 637 | Disposition: A | Discharge status: Home / Self Care | Payer: Medicare HMO | Source: Ambulatory Visit | Attending: Internal Medicine | Admitting: Internal Medicine

## 2019-05-21 DIAGNOSIS — E860 Dehydration: Secondary | ICD-10-CM | POA: Diagnosis present

## 2019-05-21 DIAGNOSIS — E1100 Type 2 diabetes mellitus with hyperosmolarity without nonketotic hyperglycemic-hyperosmolar coma (NKHHC): Secondary | ICD-10-CM | POA: Diagnosis not present

## 2019-05-21 DIAGNOSIS — Z9841 Cataract extraction status, right eye: Secondary | ICD-10-CM

## 2019-05-21 DIAGNOSIS — Z8673 Personal history of transient ischemic attack (TIA), and cerebral infarction without residual deficits: Secondary | ICD-10-CM

## 2019-05-21 DIAGNOSIS — E861 Hypovolemia: Secondary | ICD-10-CM | POA: Diagnosis present

## 2019-05-21 DIAGNOSIS — E1165 Type 2 diabetes mellitus with hyperglycemia: Secondary | ICD-10-CM | POA: Diagnosis not present

## 2019-05-21 DIAGNOSIS — Z6820 Body mass index (BMI) 20.0-20.9, adult: Secondary | ICD-10-CM

## 2019-05-21 DIAGNOSIS — I2510 Atherosclerotic heart disease of native coronary artery without angina pectoris: Secondary | ICD-10-CM | POA: Diagnosis not present

## 2019-05-21 DIAGNOSIS — Z833 Family history of diabetes mellitus: Secondary | ICD-10-CM

## 2019-05-21 DIAGNOSIS — I5021 Acute systolic (congestive) heart failure: Secondary | ICD-10-CM | POA: Diagnosis not present

## 2019-05-21 DIAGNOSIS — I428 Other cardiomyopathies: Secondary | ICD-10-CM | POA: Diagnosis present

## 2019-05-21 DIAGNOSIS — N184 Chronic kidney disease, stage 4 (severe): Secondary | ICD-10-CM | POA: Diagnosis present

## 2019-05-21 DIAGNOSIS — D509 Iron deficiency anemia, unspecified: Secondary | ICD-10-CM | POA: Diagnosis not present

## 2019-05-21 DIAGNOSIS — J449 Chronic obstructive pulmonary disease, unspecified: Secondary | ICD-10-CM | POA: Diagnosis not present

## 2019-05-21 DIAGNOSIS — I5022 Chronic systolic (congestive) heart failure: Secondary | ICD-10-CM | POA: Diagnosis not present

## 2019-05-21 DIAGNOSIS — I10 Essential (primary) hypertension: Secondary | ICD-10-CM | POA: Diagnosis not present

## 2019-05-21 DIAGNOSIS — N183 Chronic kidney disease, stage 3 unspecified: Secondary | ICD-10-CM | POA: Diagnosis present

## 2019-05-21 DIAGNOSIS — Z7902 Long term (current) use of antithrombotics/antiplatelets: Secondary | ICD-10-CM

## 2019-05-21 DIAGNOSIS — Z9842 Cataract extraction status, left eye: Secondary | ICD-10-CM

## 2019-05-21 DIAGNOSIS — Z20828 Contact with and (suspected) exposure to other viral communicable diseases: Secondary | ICD-10-CM | POA: Diagnosis not present

## 2019-05-21 DIAGNOSIS — R531 Weakness: Secondary | ICD-10-CM | POA: Diagnosis not present

## 2019-05-21 DIAGNOSIS — I130 Hypertensive heart and chronic kidney disease with heart failure and stage 1 through stage 4 chronic kidney disease, or unspecified chronic kidney disease: Secondary | ICD-10-CM | POA: Diagnosis not present

## 2019-05-21 DIAGNOSIS — Z87891 Personal history of nicotine dependence: Secondary | ICD-10-CM

## 2019-05-21 DIAGNOSIS — R05 Cough: Secondary | ICD-10-CM | POA: Diagnosis not present

## 2019-05-21 DIAGNOSIS — E1122 Type 2 diabetes mellitus with diabetic chronic kidney disease: Secondary | ICD-10-CM | POA: Diagnosis present

## 2019-05-21 DIAGNOSIS — E875 Hyperkalemia: Secondary | ICD-10-CM | POA: Diagnosis present

## 2019-05-21 DIAGNOSIS — Z7982 Long term (current) use of aspirin: Secondary | ICD-10-CM

## 2019-05-21 DIAGNOSIS — R9431 Abnormal electrocardiogram [ECG] [EKG]: Secondary | ICD-10-CM | POA: Diagnosis present

## 2019-05-21 DIAGNOSIS — E1129 Type 2 diabetes mellitus with other diabetic kidney complication: Secondary | ICD-10-CM | POA: Diagnosis present

## 2019-05-21 DIAGNOSIS — E1140 Type 2 diabetes mellitus with diabetic neuropathy, unspecified: Secondary | ICD-10-CM | POA: Diagnosis present

## 2019-05-21 DIAGNOSIS — E871 Hypo-osmolality and hyponatremia: Secondary | ICD-10-CM | POA: Diagnosis present

## 2019-05-21 DIAGNOSIS — N179 Acute kidney failure, unspecified: Secondary | ICD-10-CM | POA: Diagnosis present

## 2019-05-21 DIAGNOSIS — Z8249 Family history of ischemic heart disease and other diseases of the circulatory system: Secondary | ICD-10-CM

## 2019-05-21 DIAGNOSIS — G9341 Metabolic encephalopathy: Secondary | ICD-10-CM | POA: Diagnosis present

## 2019-05-21 DIAGNOSIS — R739 Hyperglycemia, unspecified: Secondary | ICD-10-CM | POA: Diagnosis not present

## 2019-05-21 DIAGNOSIS — E11319 Type 2 diabetes mellitus with unspecified diabetic retinopathy without macular edema: Secondary | ICD-10-CM | POA: Diagnosis present

## 2019-05-21 DIAGNOSIS — E785 Hyperlipidemia, unspecified: Secondary | ICD-10-CM | POA: Diagnosis present

## 2019-05-21 DIAGNOSIS — Z89431 Acquired absence of right foot: Secondary | ICD-10-CM

## 2019-05-21 DIAGNOSIS — Z79899 Other long term (current) drug therapy: Secondary | ICD-10-CM

## 2019-05-21 DIAGNOSIS — I4891 Unspecified atrial fibrillation: Secondary | ICD-10-CM | POA: Diagnosis present

## 2019-05-21 DIAGNOSIS — E43 Unspecified severe protein-calorie malnutrition: Secondary | ICD-10-CM | POA: Insufficient documentation

## 2019-05-21 HISTORY — DX: Heart failure, unspecified: I50.9

## 2019-05-21 LAB — CBC
HCT: 45.8 % (ref 39.0–52.0)
Hemoglobin: 14.7 g/dL (ref 13.0–17.0)
MCH: 30.1 pg (ref 26.0–34.0)
MCHC: 32.1 g/dL (ref 30.0–36.0)
MCV: 93.9 fL (ref 80.0–100.0)
Platelets: 226 10*3/uL (ref 150–400)
RBC: 4.88 MIL/uL (ref 4.22–5.81)
RDW: 13.6 % (ref 11.5–15.5)
WBC: 8.8 10*3/uL (ref 4.0–10.5)
nRBC: 0 % (ref 0.0–0.2)

## 2019-05-21 LAB — COMPREHENSIVE METABOLIC PANEL
ALT: 39 U/L (ref 0–44)
AST: 31 U/L (ref 15–41)
Albumin: 3.6 g/dL (ref 3.5–5.0)
Alkaline Phosphatase: 78 U/L (ref 38–126)
Anion gap: 12 (ref 5–15)
BUN: 78 mg/dL — ABNORMAL HIGH (ref 8–23)
CO2: 25 mmol/L (ref 22–32)
Calcium: 9.1 mg/dL (ref 8.9–10.3)
Chloride: 92 mmol/L — ABNORMAL LOW (ref 98–111)
Creatinine, Ser: 3.14 mg/dL — ABNORMAL HIGH (ref 0.61–1.24)
GFR calc Af Amer: 21 mL/min — ABNORMAL LOW (ref >60)
GFR calc non Af Amer: 18 mL/min — ABNORMAL LOW (ref >60)
Glucose, Bld: 779 mg/dL (ref 70–99)
Potassium: 5.5 mmol/L — ABNORMAL HIGH (ref 3.5–5.1)
Sodium: 129 mmol/L — ABNORMAL LOW (ref 135–145)
Total Bilirubin: 0.9 mg/dL (ref 0.3–1.2)
Total Protein: 6.8 g/dL (ref 6.5–8.1)

## 2019-05-21 LAB — CBG MONITORING, ED
Glucose-Capillary: >600 mg/dL (ref 70–99)
Glucose-Capillary: 445 mg/dL — ABNORMAL HIGH (ref 70–99)
Glucose-Capillary: 412 mg/dL — ABNORMAL HIGH (ref 70–99)
Glucose-Capillary: 318 mg/dL — ABNORMAL HIGH (ref 70–99)
Glucose-Capillary: 240 mg/dL — ABNORMAL HIGH (ref 70–99)
Glucose-Capillary: 155 mg/dL — ABNORMAL HIGH (ref 70–99)

## 2019-05-21 LAB — URINALYSIS, ROUTINE W REFLEX MICROSCOPIC
Bilirubin Urine: NEGATIVE
Glucose, UA: >=500 mg/dL — AB
Hgb urine dipstick: NEGATIVE
Ketones, ur: NEGATIVE mg/dL
Leukocytes,Ua: NEGATIVE
Nitrite: NEGATIVE
Protein, ur: NEGATIVE mg/dL
Specific Gravity, Urine: 1.012 (ref 1.005–1.030)
pH: 5 (ref 5.0–8.0)

## 2019-05-21 LAB — SARS CORONAVIRUS 2 BY RT PCR (HOSPITAL ORDER, PERFORMED IN ~~LOC~~ HOSPITAL LAB): SARS Coronavirus 2: NEGATIVE

## 2019-05-21 MED ORDER — DEXTROSE-NACL 5-0.45 % IV SOLN
INTRAVENOUS | Status: DC
  Administered 2019-05-21: 22:00:00 via INTRAVENOUS

## 2019-05-21 MED ORDER — ASPIRIN EC 81 MG PO TBEC
81.0000 mg | DELAYED_RELEASE_TABLET | Freq: Every day | ORAL | Status: DC
  Administered 2019-05-22 – 2019-05-23 (×2): 81 mg via ORAL
  Filled 2019-05-21 (×2): qty 1

## 2019-05-21 MED ORDER — HEPARIN SODIUM (PORCINE) 5000 UNIT/ML IJ SOLN
5000.0000 [IU] | Freq: Three times a day (TID) | INTRAMUSCULAR | Status: DC
  Administered 2019-05-22 – 2019-05-23 (×5): 5000 [IU] via SUBCUTANEOUS
  Filled 2019-05-21 (×5): qty 1

## 2019-05-21 MED ORDER — SODIUM CHLORIDE 0.9 % IV SOLN
INTRAVENOUS | Status: DC
  Administered 2019-05-22: 01:00:00 via INTRAVENOUS

## 2019-05-21 MED ORDER — CLOPIDOGREL BISULFATE 75 MG PO TABS
75.0000 mg | ORAL_TABLET | Freq: Every day | ORAL | Status: DC
  Administered 2019-05-22 – 2019-05-23 (×2): 75 mg via ORAL
  Filled 2019-05-21 (×2): qty 1

## 2019-05-21 MED ORDER — SIMVASTATIN 20 MG PO TABS
40.0000 mg | ORAL_TABLET | Freq: Every day | ORAL | Status: DC
  Administered 2019-05-22 – 2019-05-23 (×2): 40 mg via ORAL
  Filled 2019-05-21 (×2): qty 2

## 2019-05-21 MED ORDER — INSULIN REGULAR(HUMAN) IN NACL 100-0.9 UT/100ML-% IV SOLN
INTRAVENOUS | Status: DC
  Administered 2019-05-21: 5.4 [IU]/h via INTRAVENOUS
  Filled 2019-05-21: qty 100

## 2019-05-21 MED ORDER — ISOSORB DINITRATE-HYDRALAZINE 20-37.5 MG PO TABS
0.5000 | ORAL_TABLET | Freq: Three times a day (TID) | ORAL | Status: DC
  Administered 2019-05-22 – 2019-05-23 (×5): 0.5 via ORAL
  Filled 2019-05-21: qty 0.5
  Filled 2019-05-21 (×2): qty 1
  Filled 2019-05-21: qty 0.5
  Filled 2019-05-21 (×2): qty 1
  Filled 2019-05-21: qty 0.5

## 2019-05-21 MED ORDER — INSULIN REGULAR(HUMAN) IN NACL 100-0.9 UT/100ML-% IV SOLN: INTRAVENOUS | Status: DC

## 2019-05-21 MED ORDER — CARVEDILOL 3.125 MG PO TABS
3.1250 mg | ORAL_TABLET | Freq: Two times a day (BID) | ORAL | Status: DC
  Administered 2019-05-22 – 2019-05-23 (×4): 3.125 mg via ORAL
  Filled 2019-05-21 (×5): qty 1

## 2019-05-21 MED ORDER — SODIUM CHLORIDE 0.9 % IV BOLUS
500.0000 mL | Freq: Once | INTRAVENOUS | Status: AC
  Administered 2019-05-21: 500 mL via INTRAVENOUS

## 2019-05-21 NOTE — ED Provider Notes (Signed)
[facility] EMERGENCY DEPARTMENT Provider Note   CSN: [id number] Arrival date & time: 05/21/19  1517     History   Chief Complaint Chief Complaint  Patient presents with  . Shortness of Breath    HPI [name] is a 78 y.o. male.     78 year old male with past medical history including CKD, CHF, CVA, atrial fibrillation, type 2 diabetes mellitus, hypertension, hyperlipidemia who presents with hyperglycemia.  Patient had a visit with his PCP today and was told to come to the ER due to elevated blood sugar in the 700s and hemoglobin A1c greater than 15.  His BUN/creatinine were also elevated.  Patient states that he has been feeling fatigued, generally weak and unwell recently.  He endorses polyuria, polydipsia, and weight loss.  He has occasional cough.  No fevers or sick contacts.  He reports feeling fuzzy headed.  He is compliant with his medications.  The history is provided by the patient.  Shortness of Breath   Past Medical History:  Diagnosis Date  . Allergy   . Atrial fibrillation ([place])   . Cataract   . Chronic kidney disease    Renal Insuffiecny  . Colon polyps 2012   Colonoscopy  . Diabetes mellitus    type 2  . Diverticulosis 2012   Colonoscopy   . Hyperlipidemia   . Hypertension   . LBBB (left bundle branch block)   . Pneumonia 2015   3 times  . Stroke [facility]) 2015    Patient Active Problem List   Diagnosis Date Noted  . Acute respiratory failure with hypoxia ([place]) 04/01/2019  . 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 and stage 3 chronic kidney disease ([place]) 04/01/2019  . Goals of care, counseling/discussion 04/01/2019  . Shingles outbreak 04/01/2019  . Community acquired pneumonia of left lower lobe of lung ([place]) 02/05/2019  . CKD (chronic kidney disease) stage 3, GFR 30-59 ml/min (HCC) 02/05/2019  . Acute CHF (congestive heart failure) ([place]) 02/05/2019  .  Acute on chronic congestive heart failure ([place]) 02/04/2019  . Aftercare following surgery of the circulatory system 08/09/2016  . Status post transmetatarsal amputation of foot, right ([place]) 08/03/2016  . 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 Cellulitis and abscess of foot 03/23/2016  . Diabetic infection of right foot ([place]) 03/23/2016  . Puncture wound of foot, right, complicated 12/75/1700  . Malnutrition of moderate degree 03/23/2016  . AKI (acute kidney injury) ([place])   . Tobacco use disorder 02/28/2016  . Acute CVA (cerebrovascular accident) ([place]) 02/27/2016  . DM (diabetes mellitus), type 2 with renal complications ([place]) 17/49/4496  . Anemia 02/27/2016  . Chronic systolic CHF (congestive heart failure) ([place]) 05/05/2015  . 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 Essential hypertension 07/16/2014  . Former smoker 04/21/2014  . 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 Dizziness 01/23/2014  . Diabetes ([place]) 01/23/2014  . Facial droop 01/23/2014  . Benign neoplasm of colon 08/25/2011  . CAD, NATIVE VESSEL 05/23/2010  . DIABETIC  RETINOPATHY 03/21/2010  . Hyperlipidemia 03/21/2010  . NEUROPATHY 03/21/2010  . Primary hypertension 03/21/2010  . LBBB (left bundle branch block) 03/21/2010  . 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 CERVICAL RADICULOPATHY 03/21/2010    Past Surgical History:  Procedure Laterality Date  . AMPUTATION Right 04/28/2016   Procedure: Right Transmetatarsal Amputation;  Surgeon: Newt Minion, MD;  Location:  [facility];  Service: Orthopedics;  Laterality: Right;  . CATARACT EXTRACTION Bilateral    both eyes  . COLON SURGERY     to repair intestines as child  . COLONOSCOPY  10/20/2011   Procedure: COLONOSCOPY;  Surgeon: Inda Castle, MD;  Location: [facility];  Service: Endoscopy;  Laterality: N/A;  . FEMORAL-POPLITEAL BYPASS GRAFT Right 04/07/2016   Procedure: BYPASS GRAFT  FEMORAL-POPLITEAL ARTERY-RIGHT;  Surgeon: Angelia Mould, MD;  Location: [facility];  Service: Vascular;  Laterality: Right;  . HOT HEMOSTASIS  10/20/2011   Procedure: HOT HEMOSTASIS (ARGON PLASMA COAGULATION/BICAP);  Surgeon: Inda Castle, MD;  Location: [facility];  Service: Endoscopy;  Laterality: N/A;  . KNEE ARTHROSCOPY Left    left  . NECK SURGERY     disectomy  . PERIPHERAL VASCULAR CATHETERIZATION Right 04/05/2016   Procedure: Lower Extremity Angiography;  Surgeon: Rosetta Posner, MD;  Location: [facility];  Service: Cardiovascular;  Laterality: Right;  . PERIPHERAL VASCULAR CATHETERIZATION N/A 04/05/2016   Procedure: Abdominal Aortogram;  Surgeon: Rosetta Posner, MD;  Location: [facility];  Service: Cardiovascular;  Laterality: N/A;  . RIGHT/LEFT HEART CATH AND CORONARY ANGIOGRAPHY N/A 02/07/2019   Procedure: RIGHT/LEFT HEART CATH AND CORONARY ANGIOGRAPHY;  Surgeon: Jolaine Artist, MD;  Location: [facility] CV LAB;  Service: Cardiovascular;  Laterality: N/A;  . VEIN HARVEST Right 04/07/2016   Procedure: VEIN HARVEST GREATER SAPHENOUS VIEN ;  Surgeon: Angelia Mould, MD;  Location: [facility];  Service: Vascular;  Laterality: Right;        Home Medications    Prior to Admission medications   Medication Sig Start Date End Date Taking? Authorizing Provider  aspirin EC 81 MG EC tablet Take 1 tablet (81 mg total) by mouth daily. 02/11/19   Mariel Aloe, MD  carvedilol (COREG) 3.125 MG tablet Take 1 tablet (3.125 mg total) by mouth 2 (two) times daily. 05/08/19 06/07/19  Bensimhon, Shaune Pascal, MD  clopidogrel (PLAVIX) 75 MG tablet Take 75 mg by mouth daily.    [provider]  glimepiride (AMARYL) 1 MG tablet Take 0.5 mg by mouth 2 (two) times daily.     [provider]  isosorbide-hydrALAZINE (BIDIL) 20-37.5 MG tablet Take 0.5 tablets by mouth 3 (three) times daily. 04/18/19   Bensimhon, Shaune Pascal, MD  Polysaccharide Iron Complex (FERREX 150 PO)  Take by mouth daily.    [provider]  simvastatin (ZOCOR) 40 MG tablet Take 40 mg by mouth daily.     [provider]  torsemide (DEMADEX) 20 MG tablet Take 2 tablets (40 mg total) by mouth every other day. 04/18/19 05/18/19  Bensimhon, Shaune Pascal, MD    Family History Family History  Problem Relation Age of Onset  . Diabetes Mellitus II Mother   . Heart disease Brother        before age 79  . Colon cancer Neg Hx   . Esophageal cancer Neg Hx   . Stomach cancer Neg Hx   . Anesthesia problems Neg Hx   . Hypotension Neg Hx   . Malignant hyperthermia Neg Hx   . Pseudochol deficiency Neg Hx     Social History Social History   Tobacco Use  . Smoking status: Former Smoker    Packs/day: 0.25    Years: 20.00    Pack years: 5.00    Types: Cigarettes  . Smokeless tobacco: Never Used  . Tobacco comment: Quit June 2017  Substance Use Topics  .  Alcohol use: Yes    Alcohol/week: 0.0 standard drinks    Comment: rarely  . Drug use: No     Allergies   Patient has no known allergies.   Review of Systems Review of Systems  Respiratory: Positive for shortness of breath.    All other systems reviewed and are negative except that which was mentioned in HPI   Physical Exam Updated Vital Signs BP 139/69   Pulse 65   Temp 98 F (36.7 C) (Oral)   Resp 17   Ht 5\' 10"  (1.778 m)   Wt 65.8 kg   SpO2 100%   BMI 20.81 kg/m   Physical Exam Vitals signs and nursing note reviewed.  Constitutional:      General: He is not in acute distress.    Appearance: He is well-developed.     Comments: Thin, chronically ill-appearing, comfortable alert  HENT:     Head: Normocephalic and atraumatic.     Mouth/Throat:     Comments: Dry mouth Eyes:     Conjunctiva/sclera: Conjunctivae normal.     Pupils: Pupils are equal, round, and reactive to light.  Neck:     Musculoskeletal: Neck supple.  Cardiovascular:     Rate and Rhythm: Normal rate and regular rhythm.     Heart  sounds: Murmur present.  Pulmonary:     Effort: Pulmonary effort is normal.     Breath sounds: Normal breath sounds.  Abdominal:     General: Bowel sounds are normal. There is no distension.     Palpations: Abdomen is soft.     Tenderness: There is no abdominal tenderness.  Musculoskeletal:     Right lower leg: No edema.     Left lower leg: No edema.  Skin:    General: Skin is warm and dry.  Neurological:     Mental Status: He is alert and oriented to person, place, and time.     Comments: Fluent speech  Psychiatric:        Judgment: Judgment normal.      ED Treatments / Results  Labs (all labs ordered are listed, but only abnormal results are displayed) Labs Reviewed  URINALYSIS, ROUTINE W REFLEX MICROSCOPIC - Abnormal; Notable for the following components:      Result Value   Color, Urine STRAW (*)    Glucose, UA >=500 (*)    Bacteria, UA RARE (*)    All other components within normal limits  COMPREHENSIVE METABOLIC PANEL - Abnormal; Notable for the following components:   Sodium 129 (*)    Potassium 5.5 (*)    Chloride 92 (*)    Glucose, Bld 779 (*)    BUN 78 (*)    Creatinine, Ser 3.14 (*)    GFR calc non Af Amer 18 (*)    GFR calc Af Amer 21 (*)    All other components within normal limits  CBG MONITORING, ED - Abnormal; Notable for the following components:   Glucose-Capillary >600 (*)    All other components within normal limits  SARS CORONAVIRUS 2 (HOSPITAL ORDER, [facility])  CBC    EKG EKG Interpretation  Date/Time:  Wednesday May 21 2019 15:27:00 EDT Ventricular Rate:  98 PR Interval:  202 QRS Duration: 150 QT Interval:  402 QTC Calculation: 513 R Axis:   33 Text Interpretation:  Normal sinus rhythm Right atrial enlargement Left bundle branch block Abnormal ECG rate faster than previous Confirmed by Theotis Burrow [phone]) on 05/21/2019 4:38:20  PM   Radiology No results found.  Procedures .Critical Care  Performed by: Sharlett Iles, MD Authorized by: Sharlett Iles, MD   Critical care provider statement:    Critical care time (minutes):  30   Critical care time was exclusive of:  Separately billable procedures and treating other patients   Critical care was necessary to treat or prevent imminent or life-threatening deterioration of the following conditions:  Endocrine crisis   Critical care was time spent personally by me on the following activities:  Development of treatment plan with patient or surrogate, evaluation of patient's response to treatment, obtaining history from patient or surrogate, ordering and performing treatments and interventions, ordering and review of laboratory studies, ordering and review of radiographic studies, re-evaluation of patient's condition and review of old charts   (including critical care time)  Medications Ordered in ED Medications  insulin regular, human (MYXREDLIN) 100 units/ 100 mL infusion (5.4 Units/hr Intravenous New Bag/Given 05/21/19 1726)  sodium chloride 0.9 % bolus 500 mL (500 mLs Intravenous New Bag/Given 05/21/19 1702)     Initial Impression / Assessment and Plan / ED Course  I have reviewed the triage vital signs and the nursing notes.  Pertinent labs that were available during my care of the patient were reviewed by me and considered in my medical decision making (see chart for details).      Mentating normally on exam with normal vital signs.  Lab work notable for glucose 779, potassium 5.5, creatinine elevated from baseline at 3.14, UA without ketones and anion gap normal.  Labs are consistent with HHS however patient is difficult to resuscitate with fluids given his heart failure and low EF.  Gave small fluid bolus and initiated insulin drip to correct BG. Discussed w/ [facility], Dr. Myna Hidalgo, who will admit. COVID-19 negative.  Dajion Bickford was evaluated in Emergency Department on 05/21/2019 for the symptoms described in the  history of present illness. He was evaluated in the context of the global COVID-19 pandemic, which necessitated consideration that the patient might be at risk for infection with the SARS-CoV-2 virus that causes COVID-19. Institutional protocols and algorithms that pertain to the evaluation of patients at risk for COVID-19 are in a state of rapid change based on information released by regulatory bodies including the CDC and federal and state organizations. These policies and algorithms were followed during the patient's care in the ED.   Final Clinical Impressions(s) / ED Diagnoses   Final diagnoses:  Hyperglycemia  AKI (acute kidney injury) [facility])    ED Discharge Orders    None       Facundo Allemand, Wenda [place], MD 05/21/19 2053

## 2019-05-21 NOTE — ED Notes (Signed)
Pt's wife (elaine) would like update when possible [phone]

## 2019-05-21 NOTE — ED Triage Notes (Signed)
Pt states he just went to his pcp for a regular check-up and was told to come here.  Looking at lab work that the Dr sent, the pt's A1C is greater than 15 (was 7.3 6 weeks ago), blood sugar was 766, bun/creat elevated, etc.  Pt states he feels "fuzzy headed".

## 2019-05-21 NOTE — H&P (Signed)
History and Physical    [name]:[id number] DOB: July 27, 1941 DOA: 05/21/2019  PCP: Prince Solian, MD   Patient coming from: Home   Chief Complaint: Malaise, polyuria, polydipsia, abnormal labs   HPI: [name] is a 78 y.o. male with medical history significant for coronary artery disease, history of CVA, type 2 diabetes mellitus, chronic kidney disease stage III, and CHF with EF 15 to 20% in May 2020, now presenting to the emergency department for evaluation of malaise, polyuria and polydipsia, and abnormal outpatient blood work.  Patient complained of a general malaise with polyuria and polydipsia developing insidiously over the past week or so.  He denies any associated fevers, chills, chest pain, cough, or shortness of breath.  Denies dysuria or flank pain, but reports that he has been urinating more than usual, has been very thirsty, and unintentional weight loss was also reported.  He saw an outpatient physician for evaluation of these complaints, had basic blood work performed, and was advised to seek further evaluation in the ED.  ED Course: Upon arrival to the ED, patient is found to be afebrile, saturating well on room air, and with systolic blood pressure 811.  EKG features sinus rhythm with chronic LBBB and QTc interval 513 ms.  Chemistry panel is notable for glucose of 779, potassium 5.5, bicarb 25, BUN 78, and creatinine 3.14, up from an apparent baseline of just under 2.  CBC is unremarkable.  COVID-19 is negative.  Urinalysis notable for glucose urea and no ketonuria.  Patient was given 500 cc normal saline in the ED, started on insulin infusion, and hospitalists consulted for admission.  Review of Systems:  All other systems reviewed and apart from HPI, are negative.  Past Medical History:  Diagnosis Date  . Allergy   . Atrial fibrillation ([place])   . Cataract   . Chronic kidney disease    Renal Insuffiecny  . Colon polyps 2012   Colonoscopy  . Diabetes mellitus     type 2  . Diverticulosis 2012   Colonoscopy   . Hyperlipidemia   . Hypertension   . LBBB (left bundle branch block)   . Pneumonia 2015   3 times  . Stroke [facility]) 2015    Past Surgical History:  Procedure Laterality Date  . AMPUTATION Right 04/28/2016   Procedure: Right Transmetatarsal Amputation;  Surgeon: Newt Minion, MD;  Location: [place];  Service: Orthopedics;  Laterality: Right;  . CATARACT EXTRACTION Bilateral    both eyes  . COLON SURGERY     to repair intestines as child  . COLONOSCOPY  10/20/2011   Procedure: COLONOSCOPY;  Surgeon: Inda Castle, MD;  Location: [facility];  Service: Endoscopy;  Laterality: N/A;  . FEMORAL-POPLITEAL BYPASS GRAFT Right 04/07/2016   Procedure: BYPASS GRAFT FEMORAL-POPLITEAL ARTERY-RIGHT;  Surgeon: Angelia Mould, MD;  Location: [place];  Service: Vascular;  Laterality: Right;  . HOT HEMOSTASIS  10/20/2011   Procedure: HOT HEMOSTASIS (ARGON PLASMA COAGULATION/BICAP);  Surgeon: Inda Castle, MD;  Location: [facility];  Service: Endoscopy;  Laterality: N/A;  . KNEE ARTHROSCOPY Left    left  . NECK SURGERY     disectomy  . PERIPHERAL VASCULAR CATHETERIZATION Right 04/05/2016   Procedure: Lower Extremity Angiography;  Surgeon: Rosetta Posner, MD;  Location: [place] [facility];  Service: Cardiovascular;  Laterality: Right;  . PERIPHERAL VASCULAR CATHETERIZATION N/A 04/05/2016   Procedure: Abdominal Aortogram;  Surgeon: Rosetta Posner, MD;  Location: [place] [facility];  Service:  Cardiovascular;  Laterality: N/A;  . RIGHT/LEFT HEART CATH AND CORONARY ANGIOGRAPHY N/A 02/07/2019   Procedure: RIGHT/LEFT HEART CATH AND CORONARY ANGIOGRAPHY;  Surgeon: Jolaine Artist, MD;  Location: [place] [facility];  Service: Cardiovascular;  Laterality: N/A;  . VEIN HARVEST Right 04/07/2016   Procedure: VEIN HARVEST GREATER SAPHENOUS VIEN ;  Surgeon: Angelia Mould, MD;  Location: [place];  Service: Vascular;  Laterality: Right;     reports that he  has quit smoking. His smoking use included cigarettes. He has a 5.00 pack-year smoking history. He has never used smokeless tobacco. He reports current alcohol use. He reports that he does not use drugs.  No Known Allergies  Family History  Problem Relation Age of Onset  . Diabetes Mellitus II Mother   . Heart disease Brother        before age 60  . Colon cancer Neg Hx   . Esophageal cancer Neg Hx   . Stomach cancer Neg Hx   . Anesthesia problems Neg Hx   . Hypotension Neg Hx   . Malignant hyperthermia Neg Hx   . Pseudochol deficiency Neg Hx      Prior to Admission medications   Medication Sig Start Date End Date Taking? Authorizing Provider  aspirin EC 81 MG EC tablet Take 1 tablet (81 mg total) by mouth daily. 02/11/19  Yes Mariel Aloe, MD  carvedilol (COREG) 3.125 MG tablet Take 1 tablet (3.125 mg total) by mouth 2 (two) times daily. 05/08/19 06/07/19 Yes Bensimhon, Shaune Pascal, MD  clopidogrel (PLAVIX) 75 MG tablet Take 75 mg by mouth daily.   Yes [provider]  glimepiride (AMARYL) 1 MG tablet Take 0.5 mg by mouth 2 (two) times daily.    Yes [provider]  isosorbide-hydrALAZINE (BIDIL) 20-37.5 MG tablet Take 0.5 tablets by mouth 3 (three) times daily. 04/18/19  Yes Bensimhon, Shaune Pascal, MD  Polysaccharide Iron Complex (FERREX 150 PO) Take 1 tablet by mouth daily.    Yes [provider]  simvastatin (ZOCOR) 40 MG tablet Take 40 mg by mouth daily.    Yes [provider]  torsemide (DEMADEX) 20 MG tablet Take 2 tablets (40 mg total) by mouth every other day. 04/18/19 05/21/19 Yes Bensimhon, Shaune Pascal, MD    Physical Exam: Vitals:   05/21/19 1704 05/21/19 1705 05/21/19 1730 05/21/19 1815  BP: 123/83  139/69 122/71  Pulse: (!) 51 75 65 77  Resp: 11 13 17 18   Temp:      TempSrc:      SpO2: 100% 100% 100% 99%  Weight:      Height:        Constitutional: NAD, calm, frail  Eyes: PERTLA, lids and conjunctivae normal ENMT: Mucous membranes are  moist. Posterior pharynx clear of any exudate or lesions.   Neck: normal, supple, no masses, no thyromegaly Respiratory: no wheezing, no crackles. Normal respiratory effort. No accessory muscle use.  Cardiovascular: S1 & S2 heard, regular rate and rhythms. No extremity edema.   Abdomen: No distension, no tenderness, soft. Bowel sounds normal.  Musculoskeletal: no clubbing / cyanosis. No joint deformity upper and lower extremities.   Skin: no significant rashes, lesions, ulcers. Poor turgor. Neurologic: No facial asymmetry. Sensation intact. Moving all extremities.  Psychiatric: Alert and oriented to person, place, and situation. Very pleasant, cooperative.    Labs on Admission: I have personally reviewed following labs and imaging studies  CBC: Recent Labs  Lab 05/21/19 1539  WBC 8.8  HGB  14.7  HCT 45.8  MCV 93.9  PLT 786   Basic Metabolic Panel: Recent Labs  Lab 05/21/19 1539  NA 129*  K 5.5*  CL 92*  CO2 25  GLUCOSE 779*  BUN 78*  CREATININE 3.14*  CALCIUM 9.1   GFR: Estimated Creatinine Clearance: 18.9 mL/min (A) (by C-G formula based on SCr of 3.14 mg/dL (H)). Liver Function Tests: Recent Labs  Lab 05/21/19 1539  AST 31  ALT 39  ALKPHOS 78  BILITOT 0.9  PROT 6.8  ALBUMIN 3.6   No results for input(s): LIPASE, AMYLASE in the last 168 hours. No results for input(s): AMMONIA in the last 168 hours. Coagulation Profile: No results for input(s): INR, PROTIME in the last 168 hours. Cardiac Enzymes: No results for input(s): CKTOTAL, CKMB, CKMBINDEX, TROPONINI in the last 168 hours. BNP (last 3 results) No results for input(s): PROBNP in the last 8760 hours. HbA1C: No results for input(s): HGBA1C in the last 72 hours. CBG: Recent Labs  Lab 05/21/19 1703 05/21/19 1827 05/21/19 1934 05/21/19 2035  GLUCAP >600* 445* 412* 318*   Lipid Profile: No results for input(s): CHOL, HDL, LDLCALC, TRIG, CHOLHDL, LDLDIRECT in the last 72 hours. Thyroid Function  Tests: No results for input(s): TSH, T4TOTAL, FREET4, T3FREE, THYROIDAB in the last 72 hours. Anemia Panel: No results for input(s): VITAMINB12, FOLATE, FERRITIN, TIBC, IRON, RETICCTPCT in the last 72 hours. Urine analysis:    Component Value Date/Time   COLORURINE STRAW (A) 05/21/2019 1550   APPEARANCEUR CLEAR 05/21/2019 1550   LABSPEC 1.012 05/21/2019 1550   PHURINE 5.0 05/21/2019 1550   GLUCOSEU >=500 (A) 05/21/2019 1550   HGBUR NEGATIVE 05/21/2019 1550   BILIRUBINUR NEGATIVE 05/21/2019 1550   KETONESUR NEGATIVE 05/21/2019 1550   PROTEINUR NEGATIVE 05/21/2019 1550   UROBILINOGEN 0.2 01/23/2014 1600   NITRITE NEGATIVE 05/21/2019 1550   LEUKOCYTESUR NEGATIVE 05/21/2019 1550   Sepsis Labs: @LABRCNTIP (procalcitonin:4,lacticidven:4) ) Recent Results (from the past 240 hour(s))  SARS Coronavirus 2 [facility] order, Performed in [facility] hospital lab) Nasopharyngeal Nasopharyngeal Swab     Status: None   Collection Time: 05/21/19  5:13 PM   Specimen: Nasopharyngeal Swab  Result Value Ref Range Status   SARS Coronavirus 2 NEGATIVE NEGATIVE Final    Comment: (NOTE) If result is NEGATIVE SARS-CoV-2 target nucleic acids are NOT DETECTED. The SARS-CoV-2 RNA is generally detectable in upper and lower  respiratory specimens during the acute phase of infection. The lowest  concentration of SARS-CoV-2 viral copies this assay can detect is 250  copies / mL. A negative result does not preclude SARS-CoV-2 infection  and should not be used as the sole basis for treatment or other  patient management decisions.  A negative result may occur with  improper specimen collection / handling, submission of specimen other  than nasopharyngeal swab, presence of viral mutation(s) within the  areas targeted by this assay, and inadequate number of viral copies  (<250 copies / mL). A negative result must be combined with clinical  observations, patient history, and epidemiological information. If  result is POSITIVE SARS-CoV-2 target nucleic acids are DETECTED. The SARS-CoV-2 RNA is generally detectable in upper and lower  respiratory specimens dur ing the acute phase of infection.  Positive  results are indicative of active infection with SARS-CoV-2.  Clinical  correlation with patient history and other diagnostic information is  necessary to determine patient infection status.  Positive results do  not rule out bacterial infection or co-infection with other viruses. If result is  PRESUMPTIVE POSTIVE SARS-CoV-2 nucleic acids MAY BE PRESENT.   A presumptive positive result was obtained on the submitted specimen  and confirmed on repeat testing.  While 2019 novel coronavirus  (SARS-CoV-2) nucleic acids may be present in the submitted sample  additional confirmatory testing may be necessary for epidemiological  and / or clinical management purposes  to differentiate between  SARS-CoV-2 and other Sarbecovirus currently known to infect humans.  If clinically indicated additional testing with an alternate test  methodology [phone]) is advised. The SARS-CoV-2 RNA is generally  detectable in upper and lower respiratory sp ecimens during the acute  phase of infection. The expected result is Negative. Fact Sheet for Patients:  StrictlyIdeas.no Fact Sheet for Healthcare Providers: BankingDealers.co.za This test is not yet approved or cleared by the [place] FDA and has been authorized for detection and/or diagnosis of SARS-CoV-2 by FDA under an Emergency Use Authorization (EUA).  This EUA will remain in effect (meaning this test can be used) for the duration of the COVID-19 declaration under Section 564(b)(1) of the Act, 21 U.S.C. section 360bbb-3(b)(1), unless the authorization is terminated or revoked sooner. Performed at [facility], [place] [address] [place], [place] [address]      Radiological Exams on Admission: No  results found.  EKG: Independently reviewed. Sinus rhythm, chronic LBBB, QTc 513 ms.   Assessment/Plan   1. Hyperosmolar hyperglycemic state; type II DM  - Presents with malaise and polyuria/polydipsia, and outpatient blood work with marked hyperglycemia and increased BUN and creatinine  - He is found to have serum glucose of 779 with normal bicarb and AG, no urine ketones  - He was taken off of metformin within the past couple months but continued to use glimepiride as directed per patient and family  - In terms of precipitant for this, no underlying infection or acute illness is identified and sxs seem to have developed insidiously over >1 wk  - He was given 500 cc NS and started on insulin infusion in ED  - Continue insulin infusion with frequent CBG's and serial chem panels, convert to subcutaneous insulin once HHS resolved and he is tolerating a diet    2. Acute kidney injury superimposed on CKD III; hyperkalemia    - SCr is 3.14 on admission, up from apparent baseline of 1.95  - Likely prerenal azotemia in setting of HHS with osmotic diuresis  - He was given a 500 cc NS bolus in ED - Check FEUrea, renally-dose medications, hold torsemide, continue cautious IVF hydration, repeat chem panel  - Potassium is 5.5; anticipate improvement with insulin and IVF, will continue cardiac monitoring and follow serial chem panels   3. Chronic systolic CHF  - Hypovolemic on presentation  - EF 15-20% in May 2020  - Continue cautious IVF hydration, hold diuretic, follow daily wt and I/O's, continue Coreg as tolerated    4. CAD - No anginal complaints  - Continue ASA, Plavix, statin, nitrates, and beta-blocker   5. Prolonged QT interval   - QTc is 513 ms in ED  - Continue cardiac monitoring, keep potassium 4 and mag 2, minimize QT-prolonging medications, and repeat EKG in am     PPE: Mask, face shield  DVT prophylaxis: sq heparin  Code Status: Full, confirmed with patient  Family  Communication: Wife updated by phone  Consults called: None  Admission status: Observation     Vianne Bulls, MD [facility] Pager [phone]  If 7PM-7AM, please contact night-coverage [email or web address] Password [email or web address]  05/21/2019, 8:43 PM

## 2019-05-21 NOTE — ED Notes (Signed)
Glucose 779.  Dr. Rex Kras notified.  Working on getting pt a treatment room.

## 2019-05-22 ENCOUNTER — Encounter (HOSPITAL_COMMUNITY): Payer: Self-pay | Admitting: General Practice

## 2019-05-22 DIAGNOSIS — E860 Dehydration: Secondary | ICD-10-CM | POA: Diagnosis not present

## 2019-05-22 DIAGNOSIS — E1100 Type 2 diabetes mellitus with hyperosmolarity without nonketotic hyperglycemic-hyperosmolar coma (NKHHC): Secondary | ICD-10-CM | POA: Diagnosis not present

## 2019-05-22 DIAGNOSIS — I4891 Unspecified atrial fibrillation: Secondary | ICD-10-CM | POA: Diagnosis present

## 2019-05-22 DIAGNOSIS — E871 Hypo-osmolality and hyponatremia: Secondary | ICD-10-CM | POA: Diagnosis not present

## 2019-05-22 DIAGNOSIS — G9341 Metabolic encephalopathy: Secondary | ICD-10-CM

## 2019-05-22 DIAGNOSIS — E861 Hypovolemia: Secondary | ICD-10-CM | POA: Diagnosis present

## 2019-05-22 DIAGNOSIS — R739 Hyperglycemia, unspecified: Secondary | ICD-10-CM | POA: Diagnosis present

## 2019-05-22 DIAGNOSIS — N183 Chronic kidney disease, stage 3 (moderate): Secondary | ICD-10-CM | POA: Diagnosis not present

## 2019-05-22 DIAGNOSIS — E11319 Type 2 diabetes mellitus with unspecified diabetic retinopathy without macular edema: Secondary | ICD-10-CM | POA: Diagnosis present

## 2019-05-22 DIAGNOSIS — I130 Hypertensive heart and chronic kidney disease with heart failure and stage 1 through stage 4 chronic kidney disease, or unspecified chronic kidney disease: Secondary | ICD-10-CM | POA: Diagnosis not present

## 2019-05-22 DIAGNOSIS — E785 Hyperlipidemia, unspecified: Secondary | ICD-10-CM | POA: Diagnosis present

## 2019-05-22 DIAGNOSIS — I2510 Atherosclerotic heart disease of native coronary artery without angina pectoris: Secondary | ICD-10-CM | POA: Diagnosis present

## 2019-05-22 DIAGNOSIS — Z7982 Long term (current) use of aspirin: Secondary | ICD-10-CM | POA: Diagnosis not present

## 2019-05-22 DIAGNOSIS — E875 Hyperkalemia: Secondary | ICD-10-CM | POA: Diagnosis not present

## 2019-05-22 DIAGNOSIS — N179 Acute kidney failure, unspecified: Secondary | ICD-10-CM | POA: Diagnosis not present

## 2019-05-22 DIAGNOSIS — I5022 Chronic systolic (congestive) heart failure: Secondary | ICD-10-CM | POA: Diagnosis not present

## 2019-05-22 DIAGNOSIS — Z6820 Body mass index (BMI) 20.0-20.9, adult: Secondary | ICD-10-CM | POA: Diagnosis not present

## 2019-05-22 DIAGNOSIS — I428 Other cardiomyopathies: Secondary | ICD-10-CM | POA: Diagnosis not present

## 2019-05-22 DIAGNOSIS — E1140 Type 2 diabetes mellitus with diabetic neuropathy, unspecified: Secondary | ICD-10-CM | POA: Diagnosis present

## 2019-05-22 DIAGNOSIS — Z20828 Contact with and (suspected) exposure to other viral communicable diseases: Secondary | ICD-10-CM | POA: Diagnosis not present

## 2019-05-22 DIAGNOSIS — Z9842 Cataract extraction status, left eye: Secondary | ICD-10-CM | POA: Diagnosis not present

## 2019-05-22 DIAGNOSIS — E1122 Type 2 diabetes mellitus with diabetic chronic kidney disease: Secondary | ICD-10-CM | POA: Diagnosis present

## 2019-05-22 DIAGNOSIS — E43 Unspecified severe protein-calorie malnutrition: Secondary | ICD-10-CM | POA: Diagnosis not present

## 2019-05-22 DIAGNOSIS — R9431 Abnormal electrocardiogram [ECG] [EKG]: Secondary | ICD-10-CM | POA: Diagnosis not present

## 2019-05-22 DIAGNOSIS — E1165 Type 2 diabetes mellitus with hyperglycemia: Secondary | ICD-10-CM | POA: Diagnosis not present

## 2019-05-22 DIAGNOSIS — Z7902 Long term (current) use of antithrombotics/antiplatelets: Secondary | ICD-10-CM | POA: Diagnosis not present

## 2019-05-22 DIAGNOSIS — Z9841 Cataract extraction status, right eye: Secondary | ICD-10-CM | POA: Diagnosis not present

## 2019-05-22 LAB — CBG MONITORING, ED
Glucose-Capillary: 100 mg/dL — ABNORMAL HIGH (ref 70–99)
Glucose-Capillary: 114 mg/dL — ABNORMAL HIGH (ref 70–99)
Glucose-Capillary: 117 mg/dL — ABNORMAL HIGH (ref 70–99)
Glucose-Capillary: 271 mg/dL — ABNORMAL HIGH (ref 70–99)
Glucose-Capillary: 86 mg/dL (ref 70–99)
Glucose-Capillary: 96 mg/dL (ref 70–99)

## 2019-05-22 LAB — BASIC METABOLIC PANEL
Anion gap: 14 (ref 5–15)
BUN: 73 mg/dL — ABNORMAL HIGH (ref 8–23)
CO2: 20 mmol/L — ABNORMAL LOW (ref 22–32)
Calcium: 8.8 mg/dL — ABNORMAL LOW (ref 8.9–10.3)
Chloride: 103 mmol/L (ref 98–111)
Creatinine, Ser: 2.8 mg/dL — ABNORMAL HIGH (ref 0.61–1.24)
GFR calc Af Amer: 24 mL/min — ABNORMAL LOW (ref >60)
GFR calc non Af Amer: 21 mL/min — ABNORMAL LOW (ref >60)
Glucose, Bld: 153 mg/dL — ABNORMAL HIGH (ref 70–99)
Potassium: 4.5 mmol/L (ref 3.5–5.1)
Sodium: 137 mmol/L (ref 135–145)

## 2019-05-22 LAB — GLUCOSE, CAPILLARY
Glucose-Capillary: 296 mg/dL — ABNORMAL HIGH (ref 70–99)
Glucose-Capillary: 237 mg/dL — ABNORMAL HIGH (ref 70–99)
Glucose-Capillary: 190 mg/dL — ABNORMAL HIGH (ref 70–99)

## 2019-05-22 LAB — SODIUM, URINE, RANDOM: Sodium, Ur: 40 mmol/L

## 2019-05-22 LAB — CREATININE, URINE, RANDOM: Creatinine, Urine: 30.89 mg/dL

## 2019-05-22 MED ORDER — INSULIN ASPART 100 UNIT/ML ~~LOC~~ SOLN: 0.0000 [IU] | Freq: Three times a day (TID) | SUBCUTANEOUS | Status: DC

## 2019-05-22 MED ORDER — SODIUM CHLORIDE 0.9 % IV SOLN
INTRAVENOUS | Status: DC
  Administered 2019-05-22: 05:00:00 via INTRAVENOUS

## 2019-05-22 MED ORDER — SODIUM CHLORIDE 0.9 % IV SOLN
INTRAVENOUS | Status: DC
  Administered 2019-05-22: 09:00:00 via INTRAVENOUS

## 2019-05-22 MED ORDER — INSULIN GLARGINE 100 UNIT/ML ~~LOC~~ SOLN
10.0000 [IU] | Freq: Every day | SUBCUTANEOUS | Status: DC
  Administered 2019-05-22: 10 [IU] via SUBCUTANEOUS
  Filled 2019-05-22 (×2): qty 0.1

## 2019-05-22 MED ORDER — SODIUM CHLORIDE 0.9% FLUSH: 3.0000 mL | INTRAVENOUS | Status: DC | PRN

## 2019-05-22 MED ORDER — SODIUM CHLORIDE 0.9 % IV SOLN: 250.0000 mL | INTRAVENOUS | Status: DC | PRN

## 2019-05-22 MED ORDER — INSULIN ASPART 100 UNIT/ML ~~LOC~~ SOLN
0.0000 [IU] | Freq: Three times a day (TID) | SUBCUTANEOUS | Status: DC
  Administered 2019-05-22: 5 [IU] via SUBCUTANEOUS
  Administered 2019-05-22: 3 [IU] via SUBCUTANEOUS
  Administered 2019-05-23: 1 [IU] via SUBCUTANEOUS
  Administered 2019-05-23 (×2): 5 [IU] via SUBCUTANEOUS

## 2019-05-22 MED ORDER — INSULIN GLARGINE 100 UNIT/ML ~~LOC~~ SOLN
5.0000 [IU] | Freq: Every day | SUBCUTANEOUS | Status: DC
  Administered 2019-05-22: 5 [IU] via SUBCUTANEOUS
  Filled 2019-05-22 (×2): qty 0.05

## 2019-05-22 MED ORDER — INSULIN ASPART 100 UNIT/ML ~~LOC~~ SOLN: 0.0000 [IU] | Freq: Every day | SUBCUTANEOUS | Status: DC

## 2019-05-22 MED ORDER — SODIUM CHLORIDE 0.9% FLUSH
3.0000 mL | Freq: Two times a day (BID) | INTRAVENOUS | Status: DC
  Administered 2019-05-22: 3 mL via INTRAVENOUS

## 2019-05-22 NOTE — ED Notes (Signed)
Checked patient blood sugar it was 271 notified RN of blood sugar patient is resting with call bell in reach

## 2019-05-22 NOTE — ED Notes (Signed)
ED TO INPATIENT HANDOFF REPORT  ED Nurse Name and Phone #: Benjamine Mola [phone]  S Name/Age/Gender [name] 78 y.o. male Room/Bed: 031C/031C  Code Status   Code Status: Full Code  Home/SNF/Other Home Patient oriented to: self, place and situation Is this baseline? Yes   Triage Complete: Triage complete  Chief Complaint Weakness, SHOB, pain on knees  Triage Note Pt states he just went to his pcp for a regular check-up and was told to come here.  Looking at lab work that the Dr sent, the pt's A1C is greater than 15 (was 7.3 6 weeks ago), blood sugar was 766, bun/creat elevated, etc.  Pt states he feels "fuzzy headed".   Allergies No Known Allergies  Level of Care/Admitting Diagnosis ED Disposition    ED Disposition Condition [facility] [place] [[id number]]  Level of Care: Med-Surg [16]  Covid Evaluation: Confirmed COVID Negative  Diagnosis: Hyperosmolar non-ketotic state in patient with type 2 diabetes mellitus [facility]) [[id number]]  Admitting Physician: Vianne Bulls [[id number]]  Attending Physician: Vernell Leep D [[id number]]  Estimated length of stay: past midnight tomorrow  Certification:: I certify this patient will need inpatient services for at least 2 midnights  PT Class (Do Not Modify): Inpatient [101]  PT Acc Code (Do Not Modify): Private [1]       B Medical/Surgery History Past Medical History:  Diagnosis Date  . Allergy   . Atrial fibrillation ([place])   . Cataract   . Chronic kidney disease    Renal Insuffiecny  . Colon polyps 2012   Colonoscopy  . Diabetes mellitus    type 2  . Diverticulosis 2012   Colonoscopy   . Hyperlipidemia   . Hypertension   . LBBB (left bundle branch block)   . Pneumonia 2015   3 times  . Stroke [facility]) 2015   Past Surgical History:  Procedure Laterality Date  . AMPUTATION Right 04/28/2016   Procedure: Right Transmetatarsal Amputation;  Surgeon: Newt Minion, MD;  Location: [place];   Service: Orthopedics;  Laterality: Right;  . CATARACT EXTRACTION Bilateral    both eyes  . COLON SURGERY     to repair intestines as child  . COLONOSCOPY  10/20/2011   Procedure: COLONOSCOPY;  Surgeon: Inda Castle, MD;  Location: [facility];  Service: Endoscopy;  Laterality: N/A;  . 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 HOT HEMOSTASIS  10/20/2011   Procedure: HOT HEMOSTASIS (ARGON PLASMA COAGULATION/BICAP);  Surgeon: Inda Castle, MD;  Location: [facility];  Service: Endoscopy;  Laterality: N/A;  . KNEE ARTHROSCOPY Left    left  . NECK SURGERY     disectomy  . PERIPHERAL VASCULAR CATHETERIZATION Right 04/05/2016   Procedure: Lower Extremity Angiography;  Surgeon: Rosetta Posner, MD;  Location: [place] [facility];  Service: Cardiovascular;  Laterality: Right;  . PERIPHERAL VASCULAR CATHETERIZATION N/A 04/05/2016   Procedure: Abdominal Aortogram;  Surgeon: Rosetta Posner, MD;  Location: [place] [facility];  Service: Cardiovascular;  Laterality: N/A;  . RIGHT/LEFT HEART CATH AND CORONARY ANGIOGRAPHY N/A 02/07/2019   Procedure: RIGHT/LEFT HEART CATH AND CORONARY ANGIOGRAPHY;  Surgeon: Jolaine Artist, MD;  Location: [place] [facility];  Service: Cardiovascular;  Laterality: N/A;  . VEIN HARVEST Right 04/07/2016   Procedure: VEIN HARVEST GREATER SAPHENOUS VIEN ;  Surgeon: Harrell Gave  Nicole Cella, MD;  Location: [facility];  Service: Vascular;  Laterality: Right;     A IV Location/Drains/Wounds Patient Lines/Drains/Airways Status   Active Line/Drains/Airways    Name:   Placement date:   Placement time:   Site:   Days:   Peripheral IV 05/22/19 Left Wrist   05/22/19    0320    Wrist   less than 1   Incision (Closed) 04/07/16 Groin Right   04/07/16    0814     1140   Incision (Closed) 04/07/16 Knee Right   04/07/16    0814     1140    Incision (Closed) 04/07/16 Thigh Right;Upper   04/07/16    0814     1140   Incision (Closed) 04/07/16 Thigh Right   04/07/16    1024     1140   Incision (Closed) 04/07/16 Thigh Right   04/07/16    1056     1140   Incision (Closed) 04/07/16 Thigh Right   04/07/16    1056     1140   Incision (Closed) 04/07/16 Foot Right   04/07/16    1138     1140   Incision (Closed) 04/28/16 Leg Right   04/28/16    1338     1119   Wound / Incision (Open or Dehisced) 03/23/16 Puncture Foot Right non-healing puncture wound from nail going in foot several months ago   03/23/16    0120    Foot   1155          Intake/Output Last 24 hours  Intake/Output Summary (Last 24 hours) at 05/22/2019 1218 Last data filed at 05/21/2019 1825 Gross per 24 hour  Intake 500 ml  Output -  Net 500 ml    Labs/Imaging Results for orders placed or performed during the hospital encounter of 05/21/19 (from the past 48 hour(s))  CBC     Status: None   Collection Time: 05/21/19  3:39 PM  Result Value Ref Range   WBC 8.8 4.0 - 10.5 K/uL   RBC 4.88 4.22 - 5.81 MIL/uL   Hemoglobin 14.7 13.0 - 17.0 g/dL   HCT 45.8 39.0 - 52.0 %   MCV 93.9 80.0 - 100.0 fL   MCH 30.1 26.0 - 34.0 pg   MCHC 32.1 30.0 - 36.0 g/dL   RDW 13.6 11.5 - 15.5 %   Platelets 226 150 - 400 K/uL   nRBC 0.0 0.0 - 0.2 %    Comment: Performed at [facility], [place] [address] [place], [place] [address]  Comprehensive metabolic panel     Status: Abnormal   Collection Time: 05/21/19  3:39 PM  Result Value Ref Range   Sodium 129 (L) 135 - 145 mmol/L   Potassium 5.5 (H) 3.5 - 5.1 mmol/L   Chloride 92 (L) 98 - 111 mmol/L   CO2 25 22 - 32 mmol/L   Glucose, Bld 779 (HH) 70 - 99 mg/dL    Comment: CRITICAL RESULT CALLED TO, READ BACK BY AND VERIFIED WITH:  K NEWMAN,RN 1424 05/21/2019 WBOND    BUN 78 (H) 8 - 23 mg/dL   Creatinine, Ser 3.14 (H) 0.61 - 1.24 mg/dL   Calcium 9.1 8.9 - 10.3 mg/dL   Total Protein 6.8 6.5 - 8.1 g/dL   Albumin 3.6 3.5 - 5.0 g/dL    AST 31 15 - 41 U/L   ALT 39 0 - 44 U/L   Alkaline Phosphatase 78 38 - 126 U/L  Total Bilirubin 0.9 0.3 - 1.2 mg/dL   GFR calc non Af Amer 18 (L) >60 mL/min   GFR calc Af Amer 21 (L) >60 mL/min   Anion gap 12 5 - 15    Comment: Performed at [facility] [address] [place], [place] [address]  Urinalysis, Routine w reflex microscopic     Status: Abnormal   Collection Time: 05/21/19  3:50 PM  Result Value Ref Range   Color, Urine STRAW (A) YELLOW   APPearance CLEAR CLEAR   Specific Gravity, Urine 1.012 1.005 - 1.030   pH 5.0 5.0 - 8.0   Glucose, UA >=500 (A) NEGATIVE mg/dL   Hgb urine dipstick NEGATIVE NEGATIVE   Bilirubin Urine NEGATIVE NEGATIVE   Ketones, ur NEGATIVE NEGATIVE mg/dL   Protein, ur NEGATIVE NEGATIVE mg/dL   Nitrite NEGATIVE NEGATIVE   Leukocytes,Ua NEGATIVE NEGATIVE   RBC / HPF 0-5 0 - 5 RBC/hpf   WBC, UA 0-5 0 - 5 WBC/hpf   Bacteria, UA RARE (A) NONE SEEN   Mucus PRESENT    Hyaline Casts, UA PRESENT     Comment: Performed at [facility], [address], [place], [place] [address]  Creatinine, urine, random     Status: None   Collection Time: 05/21/19  3:50 PM  Result Value Ref Range   Creatinine, Urine 30.89 mg/dL    Comment: Performed at [facility] [address] [place], [place] [address]  Sodium, urine, random     Status: None   Collection Time: 05/21/19  3:50 PM  Result Value Ref Range   Sodium, Ur 40 mmol/L    Comment: Performed at [facility] [address] [place], [place] [address]  CBG monitoring, ED     Status: Abnormal   Collection Time: 05/21/19  5:03 PM  Result Value Ref Range   Glucose-Capillary >600 (HH) 70 - 99 mg/dL  SARS Coronavirus 2 [facility] order, Performed in [facility] hospital lab) Nasopharyngeal Nasopharyngeal Swab     Status: None   Collection Time: 05/21/19  5:13 PM   Specimen: Nasopharyngeal Swab  Result Value Ref Range   SARS Coronavirus 2 NEGATIVE NEGATIVE    Comment: (NOTE) If  result is NEGATIVE SARS-CoV-2 target nucleic acids are NOT DETECTED. The SARS-CoV-2 RNA is generally detectable in upper and lower  respiratory specimens during the acute phase of infection. The lowest  concentration of SARS-CoV-2 viral copies this assay can detect is 250  copies / mL. A negative result does not preclude SARS-CoV-2 infection  and should not be used as the sole basis for treatment or other  patient management decisions.  A negative result may occur with  improper specimen collection / handling, submission of specimen other  than nasopharyngeal swab, presence of viral mutation(s) within the  areas targeted by this assay, and inadequate number of viral copies  (<250 copies / mL). A negative result must be combined with clinical  observations, patient history, and epidemiological information. If result is POSITIVE SARS-CoV-2 target nucleic acids are DETECTED. The SARS-CoV-2 RNA is generally detectable in upper and lower  respiratory specimens dur ing the acute phase of infection.  Positive  results are indicative of active infection with SARS-CoV-2.  Clinical  correlation with patient history and other diagnostic information is  necessary to determine patient infection status.  Positive results do  not rule out bacterial infection or co-infection with other viruses. If result is PRESUMPTIVE POSTIVE SARS-CoV-2 nucleic acids MAY  BE PRESENT.   A presumptive positive result was obtained on the submitted specimen  and confirmed on repeat testing.  While 2019 novel coronavirus  (SARS-CoV-2) nucleic acids may be present in the submitted sample  additional confirmatory testing may be necessary for epidemiological  and / or clinical management purposes  to differentiate between  SARS-CoV-2 and other Sarbecovirus currently known to infect humans.  If clinically indicated additional testing with an alternate test  methodology [phone]) is advised. The SARS-CoV-2 RNA is generally   detectable in upper and lower respiratory sp ecimens during the acute  phase of infection. The expected result is Negative. Fact Sheet for Patients:  StrictlyIdeas.no Fact Sheet for Healthcare Providers: BankingDealers.co.za This test is not yet approved or cleared by the [place] FDA and has been authorized for detection and/or diagnosis of SARS-CoV-2 by FDA under an Emergency Use Authorization (EUA).  This EUA will remain in effect (meaning this test can be used) for the duration of the COVID-19 declaration under Section 564(b)(1) of the Act, 21 U.S.C. section 360bbb-3(b)(1), unless the authorization is terminated or revoked sooner. Performed at [facility], [place] [address], [place], [place] [address]   CBG monitoring, ED     Status: Abnormal   Collection Time: 05/21/19  6:27 PM  Result Value Ref Range   Glucose-Capillary 445 (H) 70 - 99 mg/dL  CBG monitoring, ED     Status: Abnormal   Collection Time: 05/21/19  7:34 PM  Result Value Ref Range   Glucose-Capillary 412 (H) 70 - 99 mg/dL  CBG monitoring, ED     Status: Abnormal   Collection Time: 05/21/19  8:35 PM  Result Value Ref Range   Glucose-Capillary 318 (H) 70 - 99 mg/dL  CBG monitoring, ED     Status: Abnormal   Collection Time: 05/21/19  9:50 PM  Result Value Ref Range   Glucose-Capillary 240 (H) 70 - 99 mg/dL  CBG monitoring, ED     Status: Abnormal   Collection Time: 05/21/19 10:57 PM  Result Value Ref Range   Glucose-Capillary 155 (H) 70 - 99 mg/dL  CBG monitoring, ED     Status: Abnormal   Collection Time: 05/22/19 12:27 AM  Result Value Ref Range   Glucose-Capillary 100 (H) 70 - 99 mg/dL  CBG monitoring, ED     Status: None   Collection Time: 05/22/19  2:25 AM  Result Value Ref Range   Glucose-Capillary 86 70 - 99 mg/dL  CBG monitoring, ED     Status: None   Collection Time: 05/22/19  3:49 AM  Result Value Ref Range   Glucose-Capillary 96 70 - 99  mg/dL  Basic metabolic panel     Status: Abnormal   Collection Time: 05/22/19  3:58 AM  Result Value Ref Range   Sodium 137 135 - 145 mmol/L   Potassium 4.5 3.5 - 5.1 mmol/L   Chloride 103 98 - 111 mmol/L   CO2 20 (L) 22 - 32 mmol/L   Glucose, Bld 153 (H) 70 - 99 mg/dL   BUN 73 (H) 8 - 23 mg/dL   Creatinine, Ser 2.80 (H) 0.61 - 1.24 mg/dL   Calcium 8.8 (L) 8.9 - 10.3 mg/dL   GFR calc non Af Amer 21 (L) >60 mL/min   GFR calc Af Amer 24 (L) >60 mL/min   Anion gap 14 5 - 15    Comment: Performed at [facility], [address], [place], [place] [address]  CBG monitoring, ED  Status: Abnormal   Collection Time: 05/22/19  5:32 AM  Result Value Ref Range   Glucose-Capillary 117 (H) 70 - 99 mg/dL   Comment 1 Notify RN   CBG monitoring, ED     Status: Abnormal   Collection Time: 05/22/19  7:31 AM  Result Value Ref Range   Glucose-Capillary 114 (H) 70 - 99 mg/dL   No results found.  Pending Labs Unresulted Labs (From admission, onward)    Start     Ordered   05/22/19 0654  Hemoglobin A1c  Add-on,   AD     05/22/19 0653   05/22/19 1975  Basic metabolic panel  Daily,   R     05/22/19 0109   05/21/19 2158  Urea nitrogen, urine  Add-on,   AD     05/21/19 2158   Unscheduled  Magnesium  With next blood draw,   R     05/21/19 2205   Unscheduled  Magnesium  With next blood draw,   R     05/22/19 0355          Vitals/Pain Today's Vitals   05/22/19 0930 05/22/19 1000 05/22/19 1030 05/22/19 1115  BP: 102/67 109/62 (!) 93/58   Pulse: 77  (!) 59   Resp: 15 16 14    Temp:      TempSrc:      SpO2: 99% 97% 98%   Weight:      Height:      PainSc:    0-No pain    Isolation Precautions No active isolations  Medications Medications  aspirin EC tablet 81 mg (81 mg Oral Given 05/22/19 0900)  carvedilol (COREG) tablet 3.125 mg (3.125 mg Oral Given 05/22/19 0819)  isosorbide-hydrALAZINE (BIDIL) 20-37.5 MG per tablet 0.5 tablet (0.5 tablets Oral Given 05/22/19 0900)   simvastatin (ZOCOR) tablet 40 mg (has no administration in time range)  clopidogrel (PLAVIX) tablet 75 mg (75 mg Oral Given 05/22/19 0900)  heparin injection 5,000 Units (5,000 Units Subcutaneous Given 05/22/19 0511)  sodium chloride flush (NS) 0.9 % injection 3 mL (3 mLs Intravenous Not Given 05/22/19 0857)  sodium chloride flush (NS) 0.9 % injection 3 mL (has no administration in time range)  0.9 %  sodium chloride infusion (has no administration in time range)  insulin glargine (LANTUS) injection 5 Units (5 Units Subcutaneous Given 05/22/19 0142)  insulin aspart (novoLOG) injection 0-9 Units (0 Units Subcutaneous Not Given 05/22/19 0733)  0.9 %  sodium chloride infusion ( Intravenous New Bag/Given 05/22/19 0842)  sodium chloride 0.9 % bolus 500 mL (0 mLs Intravenous Stopped 05/21/19 1825)    Mobility walks with device     Focused Assessments Pulmonary Assessment Handoff:  Lung sounds: Bilateral Breath Sounds: Clear O2 Device: Room Air        R Recommendations: See Admitting Provider Note  Report given to:   Additional Notes:

## 2019-05-22 NOTE — Progress Notes (Signed)
Inpatient Diabetes Program Recommendations  AACE/ADA: New Consensus Statement on Inpatient Glycemic Control (2015)  Target Ranges:  Prepandial:   less than 140 mg/dL      Peak postprandial:   less than 180 mg/dL (1-2 hours)      Critically ill patients:  140 - 180 mg/dL   Lab Results  Component Value Date   GLUCAP 114 (H) 05/22/2019   HGBA1C 7.2 (H) 04/04/2019    Review of Glycemic Control  Diabetes history: DM2 Outpatient Diabetes medications: Amaryl 0.5 mg bid + Metformin 1 gm qd Current orders for Inpatient glycemic control: Lantus 5 units + Novolog sensitive tid  Inpatient Diabetes Program Recommendations:    Noted patient admitted in July 2020 with A1c 7.2 on 04/04/19 and was >15 in PCP office yesterday with blood glucose >700. Patient is currently in the ED and will followup with patient after admitted.  Thank you, Nani Gasser. Dereon Corkery, RN, MSN, CDE  Diabetes Coordinator Inpatient Glycemic Control Team Team Pager [phone] (8am-5pm) 05/22/2019 9:38 AM

## 2019-05-22 NOTE — ED Notes (Signed)
Report attempted 

## 2019-05-22 NOTE — Progress Notes (Signed)
PROGRESS NOTE   [name]  OQH:[id number]    DOB: [birth year]    DOA: 05/21/2019  PCP: Prince Solian, MD   I have briefly reviewed patients previous medical records in [facility].  Chief Complaint  Patient presents with  . Shortness of Breath    Brief Narrative:  78 year old married male, PMH of CAD, CVA, DM 2, stage III CKD, chronic systolic CHF with EF 54-65% in May 2020, HLD, HTN, recently taken off metformin, presented to ED due to malaise, polyuria, polydipsia, unintentional weight loss and abnormal OP lab work.  In the ED, glucose 779, potassium 5.5, bicarbonate 25, BUN 78, creatinine 3.14.  Admitted for poorly controlled DM 2 with HONK, dehydration, acute on chronic kidney disease, hyperkalemia.  Briefly on IV insulin, transitioned to Lantus and SSI.  Slowly improving.   Assessment & Plan:   Principal Problem:   Uncontrolled diabetes mellitus with hyperglycemia (HCC) Active Problems:   CAD, NATIVE VESSEL   Chronic systolic CHF (congestive heart failure) (HCC)   DM (diabetes mellitus), type 2 with renal complications (HCC)   Hyperkalemia   Acute renal failure superimposed on stage 3 chronic kidney disease (HCC)   Prolonged QT interval   Hyperosmolar non-ketotic state in patient with type 2 diabetes mellitus ([place])   Poorly controlled type II DM with HONK  A1c 7.2 on 04/04/2019 and reportedly >15 in PCPs office on day of admission.  At home, remains on Amaryl 0.5 mg twice daily but metformin reportedly stopped couple months ago and CBGs have been running in the 200-high range over the last couple of weeks.  Admitting labs: Glucose 779 and normal AG without urine ketones.  Treated briefly with IV insulin drip and gentle IV fluids.  Glycemic control improved.  Transitioned to Lantus and SSI.  Increase Lantus to 10 units nightly.  Monitor CBGs closely and adjust insulins as needed.  Will likely need some maintenance insulin along with Amaryl at discharge.   Acute kidney injury superimposed on stage III chronic kidney disease  Creatinine 3.14 on admission.  Creatinine in the 1.9-2.1 range.  Likely prerenal due to dehydration from osmotic diuresis.  Continue gentle IV fluid hydration.  Creatinine has improved to 2.8 but not yet at baseline.  Follow BMP in a.m.  Avoid nephrotoxins.  Holding torsemide temporarily.  Follows with [name], Nephrology.  Hyperkalemia  Secondary to acute kidney injury.  Resolved.  Dehydration with hyponatremia  Secondary to hyperglycemia and diuretics.  Improved but clinically not resolved.  Continue gentle IV fluids overnight.  Chronic systolic/biventricular CHF/NICM  LVEF 15-20% in May 2020.  Clinically dehydrated.  Hold diuretics temporarily while gently hydrating.  Continue carvedilol and BiDil.  Follows with the advanced HF clinic.  CAD  No anginal complaints.  Continue aspirin, Plavix, carvedilol and statins.  Essential hypertension  Controlled.  Continue carvedilol and BiDil.  Hyperlipidemia  Continue statins.  Prolonged QTC  QTc 513 ms on admission.  QTC down to 480 ms on 8/27 but not sure if this is accurate given LBBB morphology.  Minimize QT prolonging medications.  History of CVA  Continue aspirin, Plavix and statins.  Suspect moderate or severe malnutrition  Dietitian consulted.   Acute metabolic encephalopathy  Baseline mental status not known.  As per RN,.  Confused at times in the ED.  At one point said that he lives with his spouse and at another point said that his spouse was dead.  No focal deficits.  Treat metabolic abnormalities as above.  Delirium  precautions and monitor closely.   DVT prophylaxis: Subcutaneous hep Code Status: Full Family Communication: None at bedside Disposition: DC home pending clinical improvement, possibly 8/28.  Since patient has ongoing acute kidney injury despite IV hydration that requires continued IV hydration with close  monitoring of BMP, some mental status changes concerning for acute metabolic encephalopathy, newly on insulin which she is likely to discharge on, patient will need to be closely monitored overnight in the hospital and hence changed from observation to inpatient status.   Consultants:  None  Procedures:  None.  Antimicrobials:  None   Subjective: Patient seen in the ED this morning.  Sitting up at edge of bed eating breakfast by himself.  States that he feels better.  Feels stronger.  States that he is thinking clearer.  Decreased urination.  As per RN, some intermittent confusion as noted above.  Objective:  Vitals:   05/22/19 1130 05/22/19 1200 05/22/19 1201 05/22/19 1230  BP: 94/64 106/70  113/62  Pulse: 75 78  86  Resp: 18  12 10   Temp:      TempSrc:      SpO2: 99% 97%  98%  Weight:      Height:        Examination:  General exam: Does not elderly male, small built, frail and chronically ill looking sitting up comfortably in bed without distress.  Oral mucosa dry. Respiratory system: Clear to auscultation. Respiratory effort normal. Cardiovascular system: S1 & S2 heard, RRR. No JVD, murmurs, rubs, gallops or clicks. No pedal edema.  Telemetry personally reviewed: Sinus rhythm with BBB morphology. Gastrointestinal system: Abdomen is nondistended, soft and nontender. No organomegaly or masses felt. Normal bowel sounds heard. Central nervous system: Alert and oriented x2. No focal neurological deficits. Extremities: Symmetric 5 x 5 power. Skin: No rashes, lesions or ulcers Psychiatry: Judgement and insight appear somewhat impaired at times. Mood & affect appropriate.     Data Reviewed: I have personally reviewed following labs and imaging studies  CBC: Recent Labs  Lab 05/21/19 1539  WBC 8.8  HGB 14.7  HCT 45.8  MCV 93.9  PLT 854   Basic Metabolic Panel: Recent Labs  Lab 05/21/19 1539 05/22/19 0358  NA 129* 137  K 5.5* 4.5  CL 92* 103  CO2 25 20*   GLUCOSE 779* 153*  BUN 78* 73*  CREATININE 3.14* 2.80*  CALCIUM 9.1 8.8*   Liver Function Tests: Recent Labs  Lab 05/21/19 1539  AST 31  ALT 39  ALKPHOS 78  BILITOT 0.9  PROT 6.8  ALBUMIN 3.6    Cardiac Enzymes: No results for input(s): CKTOTAL, CKMB, CKMBINDEX, TROPONINI in the last 168 hours.  CBG: Recent Labs  Lab 05/22/19 0225 05/22/19 0349 05/22/19 0532 05/22/19 0731 05/22/19 1227  GLUCAP 86 96 117* 114* 271*    Recent Results (from the past 240 hour(s))  SARS Coronavirus 2 [facility] order, Performed in [facility] hospital lab) Nasopharyngeal Nasopharyngeal Swab     Status: None   Collection Time: 05/21/19  5:13 PM   Specimen: Nasopharyngeal Swab  Result Value Ref Range Status   SARS Coronavirus 2 NEGATIVE NEGATIVE Final    Comment: (NOTE) If result is NEGATIVE SARS-CoV-2 target nucleic acids are NOT DETECTED. The SARS-CoV-2 RNA is generally detectable in upper and lower  respiratory specimens during the acute phase of infection. The lowest  concentration of SARS-CoV-2 viral copies this assay can detect is 250  copies / mL. A negative result does not preclude SARS-CoV-2 infection  and should not be used as the sole basis for treatment or other  patient management decisions.  A negative result may occur with  improper specimen collection / handling, submission of specimen other  than nasopharyngeal swab, presence of viral mutation(s) within the  areas targeted by this assay, and inadequate number of viral copies  (<250 copies / mL). A negative result must be combined with clinical  observations, patient history, and epidemiological information. If result is POSITIVE SARS-CoV-2 target nucleic acids are DETECTED. The SARS-CoV-2 RNA is generally detectable in upper and lower  respiratory specimens dur ing the acute phase of infection.  Positive  results are indicative of active infection with SARS-CoV-2.  Clinical  correlation with patient history and other  diagnostic information is  necessary to determine patient infection status.  Positive results do  not rule out bacterial infection or co-infection with other viruses. If result is PRESUMPTIVE POSTIVE SARS-CoV-2 nucleic acids MAY BE PRESENT.   A presumptive positive result was obtained on the submitted specimen  and confirmed on repeat testing.  While 2019 novel coronavirus  (SARS-CoV-2) nucleic acids may be present in the submitted sample  additional confirmatory testing may be necessary for epidemiological  and / or clinical management purposes  to differentiate between  SARS-CoV-2 and other Sarbecovirus currently known to infect humans.  If clinically indicated additional testing with an alternate test  methodology [phone]) is advised. The SARS-CoV-2 RNA is generally  detectable in upper and lower respiratory sp ecimens during the acute  phase of infection. The expected result is Negative. Fact Sheet for Patients:  StrictlyIdeas.no Fact Sheet for Healthcare Providers: BankingDealers.co.za This test is not yet approved or cleared by the [place] FDA and has been authorized for detection and/or diagnosis of SARS-CoV-2 by FDA under an Emergency Use Authorization (EUA).  This EUA will remain in effect (meaning this test can be used) for the duration of the COVID-19 declaration under Section 564(b)(1) of the Act, 21 U.S.C. section 360bbb-3(b)(1), unless the authorization is terminated or revoked sooner. Performed at [facility], [place] [address], [place], [place] [address]          Radiology Studies: No results found.      Scheduled Meds: . aspirin EC  81 mg Oral Daily  . carvedilol  3.125 mg Oral BID WC  . clopidogrel  75 mg Oral Daily  . heparin  5,000 Units Subcutaneous Q8H  . insulin aspart  0-9 Units Subcutaneous TID WC  . insulin glargine  5 Units Subcutaneous QHS  . isosorbide-hydrALAZINE  0.5 tablet Oral  TID  . simvastatin  40 mg Oral q1800  . sodium chloride flush  3 mL Intravenous Q12H   Continuous Infusions: . sodium chloride    . sodium chloride 60 mL/hr at 05/22/19 0842     LOS: 0 days     Vernell Leep, MD, FACP, [facility]. [facility]  To contact the attending provider between 7A-7P or the covering provider during after hours 7P-7A, please log into the web site [email or web address] and access using universal [place] password for that web site. If you do not have the password, please call the hospital operator.  05/22/2019, 1:34 PM

## 2019-05-22 NOTE — ED Notes (Signed)
CBG 100 at this time. Dr. Christia Reading Opyd informed. Awaiting orders. Insulin discontinued at this time. BMP collected and sent to lab.

## 2019-05-22 NOTE — ED Notes (Signed)
Ordered diet tray 

## 2019-05-23 DIAGNOSIS — E43 Unspecified severe protein-calorie malnutrition: Secondary | ICD-10-CM

## 2019-05-23 LAB — GLUCOSE, CAPILLARY
Glucose-Capillary: 144 mg/dL — ABNORMAL HIGH (ref 70–99)
Glucose-Capillary: 251 mg/dL — ABNORMAL HIGH (ref 70–99)
Glucose-Capillary: 280 mg/dL — ABNORMAL HIGH (ref 70–99)

## 2019-05-23 LAB — BASIC METABOLIC PANEL
Anion gap: 9 (ref 5–15)
BUN: 56 mg/dL — ABNORMAL HIGH (ref 8–23)
CO2: 22 mmol/L (ref 22–32)
Calcium: 8.3 mg/dL — ABNORMAL LOW (ref 8.9–10.3)
Chloride: 102 mmol/L (ref 98–111)
Creatinine, Ser: 2.27 mg/dL — ABNORMAL HIGH (ref 0.61–1.24)
GFR calc Af Amer: 32 mL/min — ABNORMAL LOW (ref >60)
GFR calc non Af Amer: 27 mL/min — ABNORMAL LOW (ref >60)
Glucose, Bld: 153 mg/dL — ABNORMAL HIGH (ref 70–99)
Potassium: 3.9 mmol/L (ref 3.5–5.1)
Sodium: 133 mmol/L — ABNORMAL LOW (ref 135–145)

## 2019-05-23 LAB — CBC
HCT: 37.3 % — ABNORMAL LOW (ref 39.0–52.0)
Hemoglobin: 12.8 g/dL — ABNORMAL LOW (ref 13.0–17.0)
MCH: 31.1 pg (ref 26.0–34.0)
MCHC: 34.3 g/dL (ref 30.0–36.0)
MCV: 90.8 fL (ref 80.0–100.0)
Platelets: 187 10*3/uL (ref 150–400)
RBC: 4.11 MIL/uL — ABNORMAL LOW (ref 4.22–5.81)
RDW: 13.6 % (ref 11.5–15.5)
WBC: 6.7 10*3/uL (ref 4.0–10.5)
nRBC: 0 % (ref 0.0–0.2)

## 2019-05-23 LAB — UREA NITROGEN, URINE: Urea Nitrogen, Ur: 256 mg/dL

## 2019-05-23 LAB — MAGNESIUM: Magnesium: 2.1 mg/dL (ref 1.7–2.4)

## 2019-05-23 LAB — HEMOGLOBIN A1C
Hgb A1c MFr Bld: 15 % — ABNORMAL HIGH (ref 4.8–5.6)
Mean Plasma Glucose: 384 mg/dL

## 2019-05-23 MED ORDER — ADULT MULTIVITAMIN W/MINERALS CH: 1.0000 | ORAL_TABLET | Freq: Every day | ORAL | Status: AC

## 2019-05-23 MED ORDER — INSULIN PEN NEEDLE 32G X 4 MM MISC: 0 refills | Status: AC

## 2019-05-23 MED ORDER — ADULT MULTIVITAMIN W/MINERALS CH
1.0000 | ORAL_TABLET | Freq: Every day | ORAL | Status: DC
  Administered 2019-05-23: 1 via ORAL
  Filled 2019-05-23: qty 1

## 2019-05-23 MED ORDER — TORSEMIDE 20 MG PO TABS: 20.0000 mg | ORAL_TABLET | ORAL | 0 refills | Status: DC

## 2019-05-23 MED ORDER — GLUCERNA SHAKE PO LIQD: 237.0000 mL | Freq: Three times a day (TID) | ORAL | 30 refills | Status: DC

## 2019-05-23 MED ORDER — POLYSACCHARIDE IRON COMPLEX 150 MG PO CAPS: 150.0000 mg | ORAL_CAPSULE | Freq: Every day | ORAL | 0 refills | Status: AC

## 2019-05-23 MED ORDER — INSULIN DETEMIR 100 UNIT/ML FLEXPEN: 10.0000 [IU] | PEN_INJECTOR | Freq: Every day | SUBCUTANEOUS | 0 refills | Status: DC

## 2019-05-23 MED ORDER — LIVING WELL WITH DIABETES BOOK
Freq: Once | Status: AC
  Administered 2019-05-23: 12:00:00
  Filled 2019-05-23: qty 1

## 2019-05-23 MED ORDER — GLUCERNA SHAKE PO LIQD
237.0000 mL | Freq: Three times a day (TID) | ORAL | Status: DC
  Administered 2019-05-23: 237 mL via ORAL

## 2019-05-23 MED ORDER — INSULIN STARTER KIT- PEN NEEDLES (ENGLISH)
1.0000 | Freq: Once | Status: DC
  Filled 2019-05-23: qty 1

## 2019-05-23 MED FILL — LEVEMIR FLEXTOUCH 100 UNITS: 100 | 30 days supply | Qty: 3 | Fill #0

## 2019-05-23 MED FILL — TORSEMIDE 20 MG TABLET: 20 | 30 days supply | Qty: 15 | Fill #0

## 2019-05-23 MED FILL — FERREX 150 CAPSULE: 150 | 30 days supply | Qty: 30 | Fill #0

## 2019-05-23 NOTE — Discharge Instructions (Signed)

## 2019-05-23 NOTE — Discharge Summary (Signed)
Physician Discharge Summary  [name] IPJ:[id number] DOB: [birth year]  PCP: Prince Solian, MD  Admitted from: Home Discharged to: Home  Admit date: 05/21/2019 Discharge date: 05/23/2019  Recommendations for Outpatient Follow-up:   Follow-up Information    Avva, Ravisankar, MD. Schedule an appointment as soon as possible for a visit in 1 week(s).   Specialty: Internal Medicine Why: To be seen with repeat labs (CBC & BMP). Contact information: [place] [address] [place], Shaune Pascal, MD .   Specialty: Cardiology Contact information: [address] [place] [address] [phone]        Edrick Oh, MD. Schedule an appointment as soon as possible for a visit in 2 week(s).   Specialty: Nephrology Contact information: [place] [address] [phone]          Patient has upcoming appointments with his cardiologist Dr. Mertie Moores on 05/28/2019 at 9:20 AM. Patient has upcoming appointment with Dr. Glori Bickers on 06/03/2019 at 9:40 AM.  Home Health: None Equipment/Devices: None  Discharge Condition: Improved and stable CODE STATUS: Full Diet recommendation: Heart healthy & diabetic diet.  Discharge Diagnoses:  Principal Problem:   Uncontrolled diabetes mellitus with hyperglycemia ([place]) Active Problems:   CAD, NATIVE VESSEL   Chronic systolic CHF (congestive heart failure) (HCC)   DM (diabetes mellitus), type 2 with renal complications (HCC)   Hyperkalemia   Acute renal failure superimposed on stage 3 chronic kidney disease (HCC)   Prolonged QT interval   Hyperosmolar non-ketotic state in patient with type 2 diabetes mellitus ([place])   Protein-calorie malnutrition, severe   Brief Summary: 78 year old married male, PMH of CAD, CVA, DM 2, stage III CKD, chronic systolic CHF with EF 34-19% in May 2020, HLD, HTN, recently taken off metformin, presented to ED due to malaise, polyuria,  polydipsia, unintentional weight loss and abnormal OP lab work.  In the ED, glucose 779, potassium 5.5, bicarbonate 25, BUN 78, creatinine 3.14.  Admitted for poorly controlled DM 2 with HONK, dehydration, acute on chronic kidney disease, hyperkalemia.  Briefly on IV insulin, transitioned to Lantus and SSI.     Assessment & Plan:   Poorly controlled type II DM with HONK  A1c 7.2 on 04/04/2019 and reportedly >15 in PCPs office on day of admission.  A1c 15 on 05/21/2019.  At home, remains on Amaryl 0.5 mg twice daily but metformin reportedly stopped couple months ago and CBGs have been running in the 200-high range over the last couple of weeks.  Admitting labs: Glucose 779 and normal AG without urine ketones.  Treated briefly with IV insulin drip and gentle IV fluids.  Glycemic control improved.  Transitioned to Lantus and SSI.  Increased Lantus to 10 units nightly.  CBGs better controlled but not optimal, still mildly uncontrolled and fluctuating.  Diabetes coordinator input appreciated.  At discharge, patient will be on Levemir pens 10 units at HS and continue prior home Amaryl 0.5 mg twice daily.  Patient is aware to self administer insulin from previous experience and he redemonstrated in the hospital.  Spouse also aware of administering insulin.  Close outpatient follow-up with PCP for further management.  Acute kidney injury superimposed on stage III chronic kidney disease  Creatinine 3.14 on admission.  Creatinine in the 1.9-2.1 range at baseline.  Likely prerenal due to dehydration from osmotic diuresis.  Briefly rehydrated with IV fluids and creatinine at 2.27, approaching baseline.  Torsemide was briefly held  in the hospital and will be resumed at reduced dose at discharge.  Follows with [name], Nephrology.  Hyperkalemia  Secondary to acute kidney injury.  Resolved.  Dehydration with hyponatremia  Secondary to hyperglycemia and  diuretics.  Resolved.  Chronic systolic/biventricular CHF/NICM  LVEF 15-20% in May 2020.  Clinically dehydrated.  Held diuretics temporarily while gently hydrating.  Continue carvedilol and BiDil.  Follows with the advanced HF clinic.  Resumed torsemide at reduced dose 20 mg every other day.  He was on 40 mg every other day.  CAD  No anginal complaints.  Continue aspirin, Plavix, carvedilol and statins.  Essential hypertension  Controlled.  Continue carvedilol and BiDil.  Hyperlipidemia  Continue statins.  Prolonged QTC  QTc 513 ms on admission.  QTC down to 480 ms on 8/27 but not sure if this is accurate given LBBB morphology.  Minimize QT prolonging medications.  History of CVA  Continue aspirin, Plavix and statins.  Severe malnutrition in the context of chronic illness  Dietitian consulted.   Acute metabolic encephalopathy  Baseline mental status not known.  As per RN,.  Confused at times in the ED.  At one point said that he lives with his spouse and at another point said that his spouse was dead.  No focal deficits.  Treat metabolic abnormalities as above.  Delirium precautions and monitor closely.  Resolved and mental status back to baseline.   Consultants:  None  Procedures:  None.   Discharge Instructions  Discharge Instructions    (HEART FAILURE PATIENTS) Call MD:  Anytime you have any of the following symptoms: 1) 3 pound weight gain in 24 hours or 5 pounds in 1 week 2) shortness of breath, with or without a dry hacking cough 3) swelling in the hands, feet or stomach 4) if you have to sleep on extra pillows at night in order to breathe.   Complete by: As directed    AMB Referral to [facility]   Complete by: As directed    78 year old married male, PMH of CAD, CVA, DM 2, stage III CKD, chronic systolic CHF with EF 66-59% in May 2020, HLD, HTN, recently taken off metformin, presented to ED due to malaise, polyuria,  polydipsia, unintentional weight loss and abnormal OP lab work. In the ED, glucose 779, potassium 5.5, bicarbonate 25, BUN 78, creatinine 3.14.  Admitted for poorly controlled DM 2 with HONK, dehydration, acute on chronic kidney disease, hyperkalemia. Briefly on IV insulin, transitioned to Lantus and SSI.  NOTE:  Please send patient consent form to obtain written consent for [employer] [facility] [employer] CM services.  Referral from Inpt. DM coordinator for DM management. Pt states had DM for 20-25 years and used to check his blood sugar daily when he was working, but now just checking every 2-3 days with his machine. He also mentioned being new to insulin (about 2 weeks ago), and on oral medications but unable to recall name. He reports that he is partially compliant with his diet restriction, inspite of having a spouse [name]- dietitian) who helps him manage at home.  Patient gave verbal consent for [facility] care management services for DM management (recent A1c is 7.2 on 04/04/2019 and reportedly >15 in PCPs office on day of admission).  Inpatient DM coordinator is following patient during this hospitalization.   Per MD note, At home, remains on Amaryl 0.5 mg twice daily but metformin reportedly stopped couple months ago and CBGs have been running in the 200-high range over the last couple  of weeks. Admitting labs: Glucose 779 and normal AG without urine ketones. Monitor CBGs closely and adjust insulins as needed. Will likely need some maintenance insulin along with Amaryl at discharge.   Reason for consult: Referral to [facility] RN CM coordinator for complex disease management of poor controlled DM and assess for further needs post discharge.   Diagnoses of: Diabetes   Expected date of contact: 1-3 days (reserved for hospital discharges)   Call MD for:  difficulty breathing, headache or visual disturbances   Complete by: As directed    Call MD for:  extreme fatigue   Complete by: As directed    Call MD for:  persistant  dizziness or light-headedness   Complete by: As directed    Call MD for:  persistant nausea and vomiting   Complete by: As directed    Call MD for:  severe uncontrolled pain   Complete by: As directed    Call MD for:  temperature >100.4   Complete by: As directed    Diet - low sodium heart healthy   Complete by: As directed    Diet Carb Modified   Complete by: As directed    Increase activity slowly   Complete by: As directed        Medication List    TAKE these medications   aspirin 81 MG EC tablet Take 1 tablet (81 mg total) by mouth daily.   BiDil 20-37.5 MG tablet Generic drug: isosorbide-hydrALAZINE Take 0.5 tablets by mouth 3 (three) times daily.   carvedilol 3.125 MG tablet Commonly known as: COREG Take 1 tablet (3.125 mg total) by mouth 2 (two) times daily.   clopidogrel 75 MG tablet Commonly known as: PLAVIX Take 75 mg by mouth daily.   feeding supplement (GLUCERNA SHAKE) Liqd Take 237 mLs by mouth 3 (three) times daily between meals.   glimepiride 1 MG tablet Commonly known as: AMARYL Take 0.5 mg by mouth 2 (two) times daily.   Insulin Detemir 100 UNIT/ML Pen Commonly known as: LEVEMIR Inject 10 Units into the skin at bedtime.   Insulin Pen Needle 32G X 4 MM Misc Use as directed daily at bedtime.   iron polysaccharides 150 MG capsule Commonly known as: Ferrex 150 Take 1 capsule (150 mg total) by mouth daily. What changed:   medication strength  how much to take   multivitamin with minerals Tabs tablet Take 1 tablet by mouth daily.   simvastatin 40 MG tablet Commonly known as: ZOCOR Take 40 mg by mouth daily.   torsemide 20 MG tablet Commonly known as: Demadex Take 1 tablet (20 mg total) by mouth every other day. What changed: how much to take      No Known Allergies    Procedures/Studies: No results found.    Subjective: Patient denies complaints.  Anxious to go home.  No chest pain, dyspnea, dizziness, lightheadedness.   Quite coherent this morning and speaking with his daughter on the phone.  Discharge Exam:  Vitals:   05/22/19 2106 05/23/19 0516 05/23/19 0915 05/23/19 1125  BP: 109/67 104/62 (!) 85/43 (!) 119/96  Pulse: 69 68 85 76  Resp: 16 18 18    Temp: 98.5 F (36.9 C) 98.5 F (36.9 C) 98.5 F (36.9 C)   TempSrc: Oral Oral Oral   SpO2: 100% 100% 100%   Weight: 60.1 kg     Height:        General exam: Does not elderly male, small built, frail and chronically ill looking sitting up  comfortably in bed without distress.  Oral mucosa moist Respiratory system: Clear to auscultation. Respiratory effort normal. Cardiovascular system: S1 & S2 heard, RRR. No JVD, murmurs, rubs, gallops or clicks. No pedal edema.   Gastrointestinal system: Abdomen is nondistended, soft and nontender. No organomegaly or masses felt. Normal bowel sounds heard. Central nervous system: Alert and oriented x3. No focal neurological deficits. Extremities: Symmetric 5 x 5 power. Skin: No rashes, lesions or ulcers Psychiatry: Judgement and insight appear normal. Mood & affect appropriate.     The results of significant diagnostics from this hospitalization (including imaging, microbiology, ancillary and laboratory) are listed below for reference.     Microbiology: Recent Results (from the past 240 hour(s))  SARS Coronavirus 2 [facility] order, Performed in [facility] hospital lab) Nasopharyngeal Nasopharyngeal Swab     Status: None   Collection Time: 05/21/19  5:13 PM   Specimen: Nasopharyngeal Swab  Result Value Ref Range Status   SARS Coronavirus 2 NEGATIVE NEGATIVE Final    Comment: (NOTE) If result is NEGATIVE SARS-CoV-2 target nucleic acids are NOT DETECTED. The SARS-CoV-2 RNA is generally detectable in upper and lower  respiratory specimens during the acute phase of infection. The lowest  concentration of SARS-CoV-2 viral copies this assay can detect is 250  copies / mL. A negative result does not preclude  SARS-CoV-2 infection  and should not be used as the sole basis for treatment or other  patient management decisions.  A negative result may occur with  improper specimen collection / handling, submission of specimen other  than nasopharyngeal swab, presence of viral mutation(s) within the  areas targeted by this assay, and inadequate number of viral copies  (<250 copies / mL). A negative result must be combined with clinical  observations, patient history, and epidemiological information. If result is POSITIVE SARS-CoV-2 target nucleic acids are DETECTED. The SARS-CoV-2 RNA is generally detectable in upper and lower  respiratory specimens dur ing the acute phase of infection.  Positive  results are indicative of active infection with SARS-CoV-2.  Clinical  correlation with patient history and other diagnostic information is  necessary to determine patient infection status.  Positive results do  not rule out bacterial infection or co-infection with other viruses. If result is PRESUMPTIVE POSTIVE SARS-CoV-2 nucleic acids MAY BE PRESENT.   A presumptive positive result was obtained on the submitted specimen  and confirmed on repeat testing.  While 2019 novel coronavirus  (SARS-CoV-2) nucleic acids may be present in the submitted sample  additional confirmatory testing may be necessary for epidemiological  and / or clinical management purposes  to differentiate between  SARS-CoV-2 and other Sarbecovirus currently known to infect humans.  If clinically indicated additional testing with an alternate test  methodology [phone]) is advised. The SARS-CoV-2 RNA is generally  detectable in upper and lower respiratory sp ecimens during the acute  phase of infection. The expected result is Negative. Fact Sheet for Patients:  StrictlyIdeas.no Fact Sheet for Healthcare Providers: BankingDealers.co.za This test is not yet approved or cleared by the  [place] FDA and has been authorized for detection and/or diagnosis of SARS-CoV-2 by FDA under an Emergency Use Authorization (EUA).  This EUA will remain in effect (meaning this test can be used) for the duration of the COVID-19 declaration under Section 564(b)(1) of the Act, 21 U.S.C. section 360bbb-3(b)(1), unless the authorization is terminated or revoked sooner. Performed at [facility], [place] [address], [place], [place] [address]      Labs:  CBC: Recent Labs  Lab 05/21/19 1539 05/23/19 0715  WBC 8.8 6.7  HGB 14.7 12.8*  HCT 45.8 37.3*  MCV 93.9 90.8  PLT 226 741   Basic Metabolic Panel: Recent Labs  Lab 05/21/19 1539 05/22/19 0358 05/23/19 0715  NA 129* 137 133*  K 5.5* 4.5 3.9  CL 92* 103 102  CO2 25 20* 22  GLUCOSE 779* 153* 153*  BUN 78* 73* 56*  CREATININE 3.14* 2.80* 2.27*  CALCIUM 9.1 8.8* 8.3*  MG  --   --  2.1   Liver Function Tests: Recent Labs  Lab 05/21/19 1539  AST 31  ALT 39  ALKPHOS 78  BILITOT 0.9  PROT 6.8  ALBUMIN 3.6   CBG: Recent Labs  Lab 05/22/19 1438 05/22/19 1712 05/22/19 2107 05/23/19 0715 05/23/19 1126  GLUCAP 296* 237* 190* 144* 251*   Hgb A1c Recent Labs    05/21/19 1539  HGBA1C 15.0*   Urinalysis    Component Value Date/Time   COLORURINE STRAW (A) 05/21/2019 1550   APPEARANCEUR CLEAR 05/21/2019 1550   LABSPEC 1.012 05/21/2019 1550   PHURINE 5.0 05/21/2019 1550   GLUCOSEU >=500 (A) 05/21/2019 1550   HGBUR NEGATIVE 05/21/2019 1550   BILIRUBINUR NEGATIVE 05/21/2019 1550   KETONESUR NEGATIVE 05/21/2019 1550   PROTEINUR NEGATIVE 05/21/2019 1550   UROBILINOGEN 0.2 01/23/2014 1600   NITRITE NEGATIVE 05/21/2019 1550   LEUKOCYTESUR NEGATIVE 05/21/2019 1550    I discussed in detail with patient's daughter via his speaker phone, updated care and answered questions.  She is his eldest daughter who lives in [place].  Time coordinating discharge: 45 minutes  SIGNED:  Vernell Leep, MD, FACP,  [facility]. [facility]  To contact the attending provider between 7A-7P or the covering provider during after hours 7P-7A, please log into the web site [email or web address] and access using universal [place] password for that web site. If you do not have the password, please call the hospital operator.

## 2019-05-23 NOTE — Evaluation (Signed)
Physical Therapy Evaluation Patient Details Name: [name] MRN: [id number] DOB: Mar 20, 1941 Today's Date: 05/23/2019   History of Present Illness  Pt is a 78 y.o. male admitted 05/21/19 with malaise, polyuria, unintentional weight loss; worked up for uncontrolled DM with hyperglycemia. PMH includes CAD, CVA, DM2, CKD III, CHF, HLD, HTN, R transmet amputation.    Clinical Impression  Pt presents with an overall decrease in functional mobility secondary to above. PTA, pt ambulatory with SPC/RW PRN, lives with wife, endorses falls. Today, pt ambulatory with SPC at supervision-level; very pleasant, but not receptive to education regarding fall risk reduction/RW use. Pt hopeful for d/c home today, will have necessary assist from family. If to remain admitted, will follow acutely to address established goals.    Follow Up Recommendations No PT follow up;Supervision for mobility/OOB    Equipment Recommendations  None recommended by PT    Recommendations for Other Services       Precautions / Restrictions Precautions Precautions: Fall Precaution Comments: h/o R transmet amputation (does not have orthotic at hospital) Restrictions Weight Bearing Restrictions: No      Mobility  Bed Mobility Overal bed mobility: Independent                Transfers Overall transfer level: Modified independent Equipment used: Straight cane                Ambulation/Gait Ambulation/Gait assistance: Supervision Gait Distance (Feet): 200 Feet Assistive device: Straight cane Gait Pattern/deviations: Step-through pattern;Decreased stride length Gait velocity: Decreased Gait velocity interpretation: <1.8 ft/sec, indicate of risk for recurrent falls General Gait Details: Slow, mostly steady gait with SPC at supervision-level; 2x self-correct LOB with head turns. Pt intermittently using SPC; not receptive to education regarding safer gait mechanics  Stairs            Wheelchair  Mobility    Modified Rankin (Stroke Patients Only)       Balance Overall balance assessment: Needs assistance   Sitting balance-Leahy Scale: Good       Standing balance-Leahy Scale: Fair                               Pertinent Vitals/Pain Pain Assessment: No/denies pain    Home Living Family/patient expects to be discharged to:: Private residence Living Arrangements: Spouse/significant other Available Help at Discharge: Family;Available PRN/intermittently Type of Home: House Home Access: Stairs to enter Entrance Stairs-Rails: Can reach both Entrance Stairs-Number of Steps: 6 Home Layout: Two level;Able to live on main level with bedroom/bathroom Home Equipment: Gilford Rile - 2 wheels;Cane - single point;Shower seat Additional Comments: pt has orthotic toe insert for R transmet amputation not with him in hospital     Prior Function Level of Independence: Independent with assistive device(s)         Comments: uses cane or RW when he feels off balance     Hand Dominance   Dominant Hand: Right    Extremity/Trunk Assessment   Upper Extremity Assessment Upper Extremity Assessment: Overall WFL for tasks assessed    Lower Extremity Assessment Lower Extremity Assessment: Generalized weakness       Communication   Communication: No difficulties  Cognition Arousal/Alertness: Awake/alert Behavior During Therapy: WFL for tasks assessed/performed Overall Cognitive Status: No family/caregiver present to determine baseline cognitive functioning Area of Impairment: Memory;Following commands;Safety/judgement;Awareness;Problem solving                     Memory: Decreased  short-term memory Following Commands: Follows multi-step commands inconsistently Safety/Judgement: Decreased awareness of deficits Awareness: Emergent Problem Solving: Requires verbal cues;Difficulty sequencing        General Comments      Exercises     Assessment/Plan     PT Assessment Patient needs continued PT services  PT Problem List Decreased strength;Decreased activity tolerance;Decreased balance;Decreased mobility       PT Treatment Interventions DME instruction;Gait training;Stair training;Functional mobility training;Therapeutic activities;Therapeutic exercise;Balance training;Patient/family education    PT Goals (Current goals can be found in the Care Plan section)  Acute Rehab PT Goals Patient Stated Goal: Return home today PT Goal Formulation: With patient Time For Goal Achievement: 06/06/19 Potential to Achieve Goals: Good    Frequency Min 3X/week   Barriers to discharge        Co-evaluation               AM-PAC PT "6 Clicks" Mobility  Outcome Measure Help needed turning from your back to your side while in a flat bed without using bedrails?: None Help needed moving from lying on your back to sitting on the side of a flat bed without using bedrails?: None Help needed moving to and from a bed to a chair (including a wheelchair)?: None Help needed standing up from a chair using your arms (e.g., wheelchair or bedside chair)?: None Help needed to walk in hospital room?: A Little Help needed climbing 3-5 steps with a railing? : A Little 6 Click Score: 22    End of Session Equipment Utilized During Treatment: Gait belt Activity Tolerance: Patient tolerated treatment well Patient left: in bed;with call bell/phone within reach Nurse Communication: Mobility status PT Visit Diagnosis: Other abnormalities of gait and mobility (R26.89);Muscle weakness (generalized) (M62.81)    Time: 8022-3361 PT Time Calculation (min) (ACUTE ONLY): 15 min   Charges:   PT Evaluation $PT Eval Moderate Complexity: 1 Mod     Mabeline Caras, PT, DPT Acute Rehabilitation Services  Pager [phone] Office [place] 05/23/2019, 11:58 AM

## 2019-05-23 NOTE — Progress Notes (Signed)
Initial Nutrition Assessment  DOCUMENTATION CODES:   Severe malnutrition in context of chronic illness  INTERVENTION:  -Education on small frequent meals for better BG control -Glucerna Shake po TID, each supplement provides 220 kcal and 10 grams of protein -Magic cup TID with meals, each supplement provides 290 kcal and 9 grams of protein -MVI   NUTRITION DIAGNOSIS:   Severe Malnutrition related to chronic illness as evidenced by energy intake < or equal to 75% for > or equal to 1 month, severe fat depletion, moderate muscle depletion, percent weight loss.    GOAL:   Patient will meet greater than or equal to 90% of their needs, Weight gain   MONITOR:   PO intake, Supplement acceptance, Labs, Weight trends, I & O's  REASON FOR ASSESSMENT:   Consult Malnutrition Eval  ASSESSMENT:  78 year old male with past medical history significant of CAD, CVA, T2DM, CKD3, CHF (EF 15-20%), HLD, HTN, presented to ED with malaise, plyuria, polydipsia, unintentional wt loss and abnormal outpt lab work. Patient admitted with uncontrolled DM with hyperglycemia; mental status changes for acute metabolic encephalopathy  Meal intake improving: 100% of last recorded meal Patient with 0% of 3 previous documented meals  Patient reports feeling better this morning at time of RD visit. Endorses improvements to appetite and meal intake; reports 100% of breakfast; 50-60% of dinner. Patient reports 2 meals/day when at home and states that his wife "is strict with him, she cooks with very little grease and not aloud to have dark meat or fried food"   Patient endorses approximately 100 lb wt loss over the past year or so; stating "Im nothing but skin and bones" He recalls UBW 190 lb last year.He associated unintentional wt losses with "diabetes, frequent urination, and loss of appetite" Severe fat and muscle depletion noted on physical exam  Educated patient on the importance of small frequent meals  throughout the day for better glucose control; encouraged daily ONS, and discussed healthy snack options. RD to provide pt Glucerna BID to assist with calorie and protein needs.   Current wt 60.1 kg (132.2 lb) Noted 12.6 lb wt loss over the past month  (8.7 %; severe for time frame) Wts trending down since March 2020   Medications reviewed and include: Coreg, plavix, SSI,  Lantus 10 units at bedtime Labs: CBGS 144-296 BUN 56 - trending down Cr 2.27 - trending down Hemoglobin 12.8 (L) -  trending down  Lab Results  Component Value Date   HGBA1C 15.0 (H) 05/21/2019    NUTRITION - FOCUSED PHYSICAL EXAM:    Most Recent Value  Orbital Region  Moderate depletion  Upper Arm Region  Severe depletion  Thoracic and Lumbar Region  Moderate depletion  Buccal Region  Moderate depletion  Temple Region  Moderate depletion  Clavicle Bone Region  Severe depletion  Clavicle and Acromion Bone Region  Severe depletion  Scapular Bone Region  Severe depletion  Dorsal Hand  Moderate depletion  Patellar Region  Severe depletion  Anterior Thigh Region  Severe depletion  Posterior Calf Region  Severe depletion  Edema (RD Assessment)  None  Hair  Reviewed  Eyes  Reviewed  Mouth  Reviewed  Skin  Reviewed  Nails  Reviewed       Diet Order:   Diet Order            Diet renal/carb modified with fluid restriction Diet-HS Snack? Nothing; Fluid restriction: 1200 mL Fluid; Room service appropriate? Yes; Fluid consistency: Thin  Diet  effective now              EDUCATION NEEDS:   Education needs have been addressed  Skin:     Last BM:  8/26  Height:   Ht Readings from Last 1 Encounters:  05/22/19 5\' 10"  (1.778 m)    Weight:   Wt Readings from Last 1 Encounters:  05/22/19 60.1 kg    Ideal Body Weight:     BMI:  Body mass index is 19.01 kg/m.  Estimated Nutritional Needs:   Kcal:  1800-2000  Protein:  90-100  Fluid:  >1.8L   Lajuan Lines, RD, LDN  After  Hours/Weekend Pager: [phone]

## 2019-05-23 NOTE — Progress Notes (Signed)
DISCHARGE NOTE HOME Thurmond Hildebran to be discharged Home per MD order. Discussed prescriptions and follow up appointments with the patient. Prescriptions given to patient; medication list explained in detail. Patient verbalized understanding.  Skin clean, dry and intact without evidence of skin break down, no evidence of skin tears noted. IV catheter discontinued intact. Site without signs and symptoms of complications. Dressing and pressure applied. Pt denies pain at the site currently. No complaints noted.  Patient free of lines, drains, and wounds.   An After Visit Summary (AVS) was printed and given to the patient. Patient escorted via wheelchair, and discharged home via private auto.  Arlyss Repress, RN

## 2019-05-23 NOTE — Consult Note (Signed)
   [facility] Inpatient Consult   05/23/2019  Chancellor Vanderloop [birth year] [id number]    Referral received from Inpatient DM coordinator Deboraha Sprang) through [place] regarding DM management. Patient reviewed and noted with 24% high risk score for unplanned readmission and 3 hospitalizations in the past 6 months, under his [employer] benefits.  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 In the ED, glucose 779, potassium 5.5, bicarbonate 25, BUN 78, creatinine 3.14.  Admitted for poorly controlled DM 2 with HONK, dehydration, acute on chronic kidney disease, hyperkalemia. Briefly on IV insulin, transitioned to Lantus and SSI.  Slowly improving.  Called to speak with patient in his room but no response. Second attempt made with help from the nurses' station, and was able to speak with patient. HIPAA information obtained. Patient reports that he will be discharging home either today or tomorrow. Discussed with patient about DM management at home and reports that he had DM for 20-25 years. He used to check his blood sugar daily when he was working, but now just checking every 2-3 days with his machine. Encouraged patient to monitor and record everyday and bring results to provider's visit. Patient agreed. He also mentioned being new to insulin (about 2 weeks ago), and on oral medications but unable to recall name. He reports that he is partially compliant with his diet restriction, inspite of having a spouse [name]- dietitian) who helps him manage at home.  Patient admits that he definitely needs assistance in further managing his health conditions- specifically his poorly controlled DM and giving verbal consent for  [facility] care management services to follow him up post discharge for DM management (recent A1c is 7.2 on 04/04/2019 and reportedly >15 in PCPs office on day of admission).  Patient states having no issues or concerns regarding medications (wife-assisted)/ pharmacy (using [facility] [place]. He drives or wife provides transportation if needed and wife serves as his primary caregiver at home.  Inpatient DM coordinator is following patient during this hospitalization.   Referral made to [place] care management coordinator for complex disease management of DM and assess for further needs post discharge.  Patient's primary care provider isDr. Berneta Sages with [facility], listed as providing transitionof care.  Outreached transition of care CM to notify of [facility] care management following patient after discharge. Was informed that he may discharge today or over the weekend, No PT follow-up needed and that he is followed by [facility].   For questions and additional information, please call:  Tinsley Lomas A. Cassandra Mcmanaman, BSN, RN-BC [facility] Liaison Cell: [phone]

## 2019-05-23 NOTE — Progress Notes (Addendum)
Inpatient Diabetes Program Recommendations  AACE/ADA: New Consensus Statement on Inpatient Glycemic Control (2015)  Target Ranges:  Prepandial:   less than 140 mg/dL      Peak postprandial:   less than 180 mg/dL (1-2 hours)      Critically ill patients:  140 - 180 mg/dL   Lab Results  Component Value Date   GLUCAP 251 (H) 05/23/2019   HGBA1C 15.0 (H) 05/21/2019    Review of Glycemic Control Results for TATEN, MERROW (MRN [id number]) as of 05/23/2019 13:39  Ref. Range 05/22/2019 21:07 05/23/2019 07:15 05/23/2019 11:26  Glucose-Capillary Latest Ref Range: 70 - 99 mg/dL 190 (H) 144 (H) 251 (H)   Diabetes history: DM2 Outpatient Diabetes medications: Amaryl 0.5 mg bid + Metformin 1 gm qd Current orders for Inpatient glycemic control: Lantus 10 units + Novolog sensitive tid  Inpatient Diabetes Program Recommendations:   If to remain inpatient and if post prandials continue to exceed inpatient goals of 180 mg/dL, consider starting Novolog 3 units TID (Assuming patient is consuming >50% of meal).  Spoke with patient regarding DM management. Verified home medications. Patient was recently taken off Metformin d/t CKD. Patient experienced fatigue and thirst. Reports being on insulin several years ago and was taken off because of improvement to blood glucose levels; used insulin pen prior. Reviewed patient's current A1c of 15.0% which is up from 7.2% on 04/04/2019. Explained what a A1c is and what it measures. Also reviewed goal A1c with patient, importance of good glucose control @ home, and blood sugar goals. Reviewed patho of DM, need for insulin, role of pancreas, hypo vs hyper glycemia, interventions associated, vascular changes and comorbidites. Patient has a meter and was checking occassionally. Encouraged to increase the frequency of CBG checks to at least 2-3 times per day. Reviewed when to call MD and reviewed survival skills.  Educated patient on insulin pen use at home. Reviewed contents of  insulin flexpen starter kit. Reviewed all steps if insulin pen including attachment of needle, 2-unit air shot, dialing up dose, giving injection, removing needle, disposal of sharps, storage of unused insulin, disposal of insulin etc. Patient able to provide successful return demonstration. Also reviewed troubleshooting with insulin pen. MD to give patient Rxs for insulin pens (Basaglar or Levemir based off insurance preference) and insulin pen needles [phone]. Consider Levemir 10 units QHS (Flexpen #[id number]) for discharge based off today's current trends. Would not recommend restarting Metformin given GFR <45.  Patient has no further questions at this time. Consults placed for [facility] and dietitian. Also, ordered insulin starter kit and LWWDM for further reinforcement.   Thanks, Bronson Curb, MSN, RNC-OB Diabetes Coordinator [phone] (8a-5p)

## 2019-05-24 MED FILL — UNIFINE PENTIPS 32GX5/32: 32G X 4 MM | 100 days supply | Qty: 100 | Fill #0

## 2019-05-24 MED FILL — UNIFINE PENTIPS 32GX5/32": 32G X 4 MM | 100 days supply | Qty: 100 | Fill #0

## 2019-05-26 ENCOUNTER — Telehealth: Payer: Self-pay | Admitting: Cardiovascular Disease

## 2019-05-26 NOTE — Telephone Encounter (Signed)
New Message:      Wife called and wanted to know if pt need his appt on 05-28-19 with Dr Acie Fredrickson, since he has an appt with Dr Haroldine Laws on 06-03-19?

## 2019-05-26 NOTE — Telephone Encounter (Signed)
Spoke with patient's wife who states patient would like to cancel appointment with Dr. Acie Fredrickson for 9/2. She states patient has been seen by [name] more recently and they will continue to have him as primary cardiologist. She thanked me for the call.

## 2019-05-27 ENCOUNTER — Other Ambulatory Visit: Payer: Self-pay | Admitting: *Deleted

## 2019-05-27 ENCOUNTER — Encounter: Payer: Self-pay | Admitting: *Deleted

## 2019-05-27 NOTE — Patient Outreach (Addendum)
[facility]) Care Management  05/27/2019  [name] 08-24-1941 [id number]   Subjective: Telephone call to patient's home / mobile number, spoke with patient, and HIPAA verified.  Patient gave [facility]  verbal consent to speak with wife/ designated party release [name]) regarding his healthcare needs as needed.  Patient placed RNCM call on speaker phone with wife, RNCM spoke with patient and wife throughout assessment.  Discussed [facility] Liaison/ MD referral follow up, patient voiced understanding, and is in agreement to follow up.   Patient states he is doing okay and does not remember speaking with referral source.   Wife states patient has the following hospital follow up appointment: on on 05/28/2019 with nephrologist, on 05/29/2019 with primary MD, and on 06/03/2019 with cardiologist.  Patient states he is able to manage self care and has assistance as needed.   Wife voices understanding of patient's medical diagnosis and treatment plan.   Wife states she currently works at [employer], has a background in [employer], in nutrition, is very knowledge regarding patient's chronic conditions, is needing assistance with regulating patient's blood sugars, and is in agreement to [place] [facility] Telephonic RNCM diabetes care coordination services.   Wife's patient blood sugar has been running very high over the last few weeks due to recent medications changes, has spoken with primary MD's office several times since patient's hospital discharge, and is waiting for call back today to discuss next steps.  Wife states fasting blood sugar today was 432, patient has had some blurred vision off, and on, MD aware.  Wife states she is aware of signs/ symptoms to report on patient's behalf, how to reach provider if needed after hours, when to go to ED, and / or call 911.  Per chart review patient's A1C on 04/04/2019 was 7.2,  reportedly >15 in primary  MDs office on day of admission.  A1c 15 on 05/21/2019.Marland Kitchen   States she is accessing patient's [place] Medicare benefits as needed on patient's behalf via member services number on back of card.   States she is in the process of contacting local pharmacy ([facility]) to have patient's medications transferred from [place] since they are patient's insurance company preferred provider.  Discussed Advanced Directives, advised of [place] [facility] Social Worker  Advanced Directives document completion benefit, patient voices understanding, and in agreement to a referral to Education officer, museum will to ask Social Worker to send [employer], and follow up on document completion.    Wife states patient still smokes 1 - 2 cigarettes per day, has be encouraged by MDs to stop smoking, patient voices understanding,  and is in agreement to a referral to [place] Management Social Worker for smoking cessation community resources.   Wife states patient has recently lost weight, eating better since hospital discharge, has diabetes, has kidney issue, needs nutritional supplement, was prescribed glucerna, wife concerned with glucerna sugar content, and is planning to follow up with providers regarding other supplement recommendations.   Wife states patient weighs daily but does not always record weight and weight today was around 150.  RNCM advised wife and  patient to record daily weights, to report to providers as needed, they voice understanding, and will follow up.  Wife states patient does not have any transportation or pharmacy needs at this time.  States she is very appreciative of the follow up , is in agreement to receive [place] [facility] services on  patient's behalf, and follow up call on 06/10/2019.  RNCM advised patient's wife to notify MD of any changes in patient's condition prior to scheduled appointment.    RNCM provided contact name and number: [phone] or main office number  [phone] and 24 hour nurse advise line [phone].  Telephone call from  patient's home / mobile number, spoke with patient's wife [name]), she stated patient's name, date of birth, and addres.  States she just took patient's blood sugar, it is now 347, and she is planning to call MD's office to report, if she has not received a call back by 5:30pm today.      Objective: Per KPN (Knowledge Performance Now, point of care tool) and chart review, patient hospitalized 05/21/2019 - 05/23/2019 for Uncontrolled diabetes mellitus with hyperglycemia,  04/01/2019 - 04/04/2019 for acute respiratory failure, 02/04/2019 -02/10/2019 for Acute on chronic combined systolic/diastolic congestive heart failure. Patient also has a history of chronic kidney disease stage 3, hypertension,  Community acquired pneumonia of left lower lobe of lung, CAD, LBBB (left bundle branch block), Acute metabolic encephalopathy, Protein-calorie malnutrition, severe, and hyperlipidemia.      Assessment: Received [name] [facility] Liaison /MD referral on 05/26/2019.   Referral source: [name].    Referral reason: complex diabetes management.   Screening follow up completed, RNCM will follow patient for diabetes care coordination, and will refer patient to [place] Management  Social Worker for Advanced Directives follow up, and Smoking Cessation community resources.  Depression screen PHQ 2/9 05/27/2019  Decreased Interest 0  Down, Depressed, Hopeless 1  PHQ - 2 Score 1   Fall Risk  05/27/2019  Falls in the past year? 1  Number falls in past yr: 1  Comment Patient states maybe 4 - 5  Injury with Fall? 0  Risk for fall due to : History of fall(s)  Follow up Education provided   Outpatient Encounter Medications as of 05/27/2019  Medication Sig Note  . aspirin EC 81 MG EC tablet Take 1 tablet (81 mg total) by mouth daily.   . carvedilol (COREG) 3.125 MG tablet Take 1 tablet (3.125 mg total) by  mouth 2 (two) times daily. 05/21/2019: AM 0900 PM 2100  . clopidogrel (PLAVIX) 75 MG tablet Take 75 mg by mouth daily.   Marland Kitchen glimepiride (AMARYL) 1 MG tablet Take 0.5 mg by mouth 2 (two) times daily.    . Insulin Detemir (LEVEMIR) 100 UNIT/ML Pen Inject 10 Units into the skin at bedtime.   . Insulin Pen Needle 32G X 4 MM MISC Use as directed daily at bedtime.   . iron polysaccharides (FERREX 150) 150 MG capsule Take 1 capsule (150 mg total) by mouth daily.   . isosorbide-hydrALAZINE (BIDIL) 20-37.5 MG tablet Take 0.5 tablets by mouth 3 (three) times daily.   . Multiple Vitamin (MULTIVITAMIN WITH MINERALS) TABS tablet Take 1 tablet by mouth daily.   . simvastatin (ZOCOR) 40 MG tablet Take 40 mg by mouth daily.    Marland Kitchen torsemide (DEMADEX) 20 MG tablet Take 1 tablet (20 mg total) by mouth every other day.   . feeding supplement, GLUCERNA SHAKE, (GLUCERNA SHAKE) LIQD Take 237 mLs by mouth 3 (three) times daily between meals. (Patient not taking: Reported on 05/27/2019) 05/27/2019: Wife states she has contacted regarding alternative since patient is a diabetic.   No facility-administered encounter medications on file as of 05/27/2019.       [facility] CM Care Plan Problem One  Most Recent Value  Care Plan Problem One  Patient's wife reports patient has diabetes, chronic kidney disease stage 3, and protein malnutrition, needs nutritional supplment with less sugar.  Role Documenting the Problem One  Care Management Telephonic Coordinator  Care Plan for Problem One  Active  THN CM Short Term Goal #1   Patient will follow up with providers over the next 14 days and obtain recommendations for nutritional supplement as evidenced by [facility] confirmation upon next patient outreach.  THN CM Short Term Goal #1 Start Date  05/27/19  Interventions for Short Term Goal #1  RNCM advised patient's wife to discuss patient's nutritional supplement concerns with providers at next scheduled appointment and obtain recommendations.     THN CM Care Plan Problem Two     Most Recent Value  Care Plan Problem Two  Patient's blood sugar range today as reported by wife was 432 - 347.  Role Documenting the Problem Two  Care Management Telephonic [place] for Problem Two  Active  Interventions for Problem Two Long Term Goal   RNCM advised patient's wife to continue to monitor patient's blood sugars daily, keep a daily log, and report to MD as needed.  THN Long Term Goal  Patient will report blood sugar reading of less than 250 and / or A1C reading  less than 7.2 in the next 30 days.  THN Long Term Goal Start Date  05/27/19  [facility] CM Short Term Goal #2   Patient / patient's wife  will verbalize establish blood sugar goal and/ or AC1 goal with primary provider within the next  14 days.  THN CM Short Term Goal #2 Start Date  05/27/19  Interventions for Short Term Goal #2  RNCM advsied patient's wife to verify patient's blood sugar goal and/ or AC1 goal with primary provider at next appointment.      Plan: RNCM will refer patient to [place] Management  Social Worker for Advanced Directives follow up, and Smoking Cessation community resources. RNCM will send patient successful outreach letter, welcome packet, [facility] pamphlet, and magnet. RNCM will send patient's primary MD involvement letter and route assessment.  RNCM will follow up with patient or patient's wife within 51 business days, for diabetes care coordination follow up.     Woodruff Skirvin [name], BSN, [place] Management [facility] Telephonic CM Phone: [phone] Fax: [phone]

## 2019-05-28 ENCOUNTER — Ambulatory Visit: Payer: Medicare HMO | Admitting: Cardiovascular Disease

## 2019-05-28 DIAGNOSIS — I2510 Atherosclerotic heart disease of native coronary artery without angina pectoris: Secondary | ICD-10-CM | POA: Diagnosis not present

## 2019-05-28 DIAGNOSIS — N2581 Secondary hyperparathyroidism of renal origin: Secondary | ICD-10-CM | POA: Diagnosis not present

## 2019-05-28 DIAGNOSIS — N183 Chronic kidney disease, stage 3 (moderate): Secondary | ICD-10-CM | POA: Diagnosis not present

## 2019-05-28 DIAGNOSIS — E785 Hyperlipidemia, unspecified: Secondary | ICD-10-CM | POA: Diagnosis not present

## 2019-05-28 DIAGNOSIS — I509 Heart failure, unspecified: Secondary | ICD-10-CM | POA: Diagnosis not present

## 2019-05-28 DIAGNOSIS — J449 Chronic obstructive pulmonary disease, unspecified: Secondary | ICD-10-CM | POA: Diagnosis not present

## 2019-05-28 DIAGNOSIS — E875 Hyperkalemia: Secondary | ICD-10-CM | POA: Diagnosis not present

## 2019-05-28 DIAGNOSIS — D631 Anemia in chronic kidney disease: Secondary | ICD-10-CM | POA: Diagnosis not present

## 2019-05-28 DIAGNOSIS — H3500 Unspecified background retinopathy: Secondary | ICD-10-CM | POA: Diagnosis not present

## 2019-05-28 DIAGNOSIS — E1122 Type 2 diabetes mellitus with diabetic chronic kidney disease: Secondary | ICD-10-CM | POA: Diagnosis not present

## 2019-05-29 ENCOUNTER — Other Ambulatory Visit: Payer: Self-pay | Admitting: *Deleted

## 2019-05-29 ENCOUNTER — Other Ambulatory Visit: Payer: Self-pay

## 2019-05-29 DIAGNOSIS — Z66 Do not resuscitate: Secondary | ICD-10-CM | POA: Diagnosis not present

## 2019-05-29 DIAGNOSIS — I5021 Acute systolic (congestive) heart failure: Secondary | ICD-10-CM | POA: Diagnosis not present

## 2019-05-29 DIAGNOSIS — I69959 Hemiplegia and hemiparesis following unspecified cerebrovascular disease affecting unspecified side: Secondary | ICD-10-CM | POA: Diagnosis not present

## 2019-05-29 DIAGNOSIS — Z720 Tobacco use: Secondary | ICD-10-CM | POA: Diagnosis not present

## 2019-05-29 DIAGNOSIS — N183 Chronic kidney disease, stage 3 (moderate): Secondary | ICD-10-CM | POA: Diagnosis not present

## 2019-05-29 DIAGNOSIS — R739 Hyperglycemia, unspecified: Secondary | ICD-10-CM | POA: Diagnosis not present

## 2019-05-29 DIAGNOSIS — N289 Disorder of kidney and ureter, unspecified: Secondary | ICD-10-CM | POA: Diagnosis not present

## 2019-05-29 DIAGNOSIS — I25118 Atherosclerotic heart disease of native coronary artery with other forms of angina pectoris: Secondary | ICD-10-CM | POA: Diagnosis not present

## 2019-05-29 DIAGNOSIS — E785 Hyperlipidemia, unspecified: Secondary | ICD-10-CM | POA: Diagnosis not present

## 2019-05-29 DIAGNOSIS — I129 Hypertensive chronic kidney disease with stage 1 through stage 4 chronic kidney disease, or unspecified chronic kidney disease: Secondary | ICD-10-CM | POA: Diagnosis not present

## 2019-05-29 NOTE — Patient Outreach (Signed)
[facility]) Care Management  05/29/2019  [name] March 13, 1941 [id number]   Social work referral received from [facility], [employer].  "Referral to Social Worker to send Advanced Directive packet, follow up on documents completion, and provide smoking cessation community resources. Wife states patient still smokes 1 - 2 cigarettes per day, and has be encouraged by MDs to stop smoking." Unsuccessful outreach today.  Left messages on home and mobile numbers.  Mailed Unsuccessful Outreach Letter.  Will attempt to reach again within four business days.   Ronn Melena, BSW Social Worker [phone]

## 2019-05-29 NOTE — Patient Outreach (Signed)
[facility]) Care Management  05/29/2019  [name] [birth year] [id number]  Subjective: Telephone call to patient's home / mobile number, spoke with patient, and HIPAA verified.  RNCM  has received verbal consent from patient in the past to speak with wife/ designated party release [name]) regarding his healthcare needs as needed.  Patient placed RNCM call on speaker phone with wife, RNCM spoke with patient and wife throughout the call.  Patient states he is doing pretty good, wife states they are currently in the car waiting to be call in for office visit with primary MD (due to covid restrictions), spoke briefly, RNCM advised will call back by 06/10/2019 to follow up, patient and wife voice understanding, both are in agreement.     Objective: Per KPN (Knowledge Performance Now, point of care tool) and chart review, patient hospitalized 05/21/2019 - 05/23/2019 for Uncontrolled diabetes mellitus with hyperglycemia,  04/01/2019 - 04/04/2019 for acute respiratory failure, 02/04/2019 -02/10/2019 for Acute on chronic combined systolic/diastolic congestive heart failure. Patient also has a history of chronic kidney disease stage 3, hypertension,  Community acquired pneumonia of left lower lobe of lung, CAD, LBBB (left bundle branch block), Acute metabolic encephalopathy, Protein-calorie malnutrition, severe, and hyperlipidemia.     Assessment: Received [name] [facility] Liaison /MD referral on 05/26/2019.   Referral source: [name].    Referral reason: complex diabetes management.  Screening follow up completed, RNCM will follow patient for diabetes care coordination, and have referred patient to [place] Management  Social Worker for Advanced Directives follow up, and Smoking Cessation community resources.     Plan: RNCM has referred patient to [place] Management  Social Worker for Advanced Directives follow up, and Smoking Cessation community  resources. RNCM has sent patient successful outreach letter, welcome packet, [facility] pamphlet, and magnet. RNCM has sent patient's primary MD involvement letter and routed assessment.  RNCM will follow up with patient or patient's wife within 46 business days, for diabetes care coordination follow up.     Anya Murphey [name], BSN, [place] Management [facility] Telephonic CM Phone: [phone] Fax: [phone]

## 2019-06-02 NOTE — Progress Notes (Signed)
PCP: Dr Dagmar Hait  Primary HF Cardiologist: Dr Haroldine Laws   HPI: [name] is a 78 y.o. 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 Echo EF 15-20% with severe RV dysfunction. Had RHC/LHC with volume overlaod and high grade disttal LM lesion. CT surgery consulted. He was not thought to be surgery candidate given severe RV dysfunction, advanced age, fraility and CKD. Case reviewed with Dr. Burt Knack with regard to possibility of LM stenting. It is highly calcified lesion and with severely reduced EF would need probable Impella support but PAD likely precludes. Additionally, LV was down prior to CAD so may not improve with stenting -> medical management recommended. Improved markedly with diuresis. D/c weight 146   Readmitted to [facility] in 8/20 with poorly controlled DM2 (HgBa1c 15%). Treated with IV insulin and IVF.  Today he returns for HF follow up with his wife. At last visit said he was improving but had a deep cough again and worried about recurrent PNA. CXR was ok. Says he is feeling much better. Getting stronger. Feeding 25 chickens and cleaning coops. No CP. Good appetite. Gained 5-6 pounds. No CP, SOB, orthopnea or PND. Cough resolved. SBP typically in 120s   RHC/LHC 02/07/2019   Mid RCA to Dist RCA lesion is 50% stenosed.  Prox RCA lesion is 40% stenosed.  Mid LM to Dist LM lesion is 80% stenosed.  RPDA lesion is 90% stenosed.  Ost Cx to Prox Cx lesion is 80% stenosed.  Dist LM to Ost LAD lesion is 60% stenosed.  Prox Cx to Mid Cx lesion is 50% stenosed.  Prox LAD to Mid LAD lesion is 70% stenosed.  Findings: Ao = 136/76 (97) LV = 139/28 RA = 9 RV = 65/10 PA = 67/29 (48) PCW = 36 Fick  cardiac output/index = 4.1/2.2 PVR = 3.0 WU SVR = 1721 Assessment: 1. Severe 2v CAD with probable high grade, calcified distal left main lesion 2. EF 20-25% due to iCM 3. Elevated filling pressures with moderately reduced CO  Echo  EF 40-45% in 2015  EF 25-30% in 2016 EF 35-40% in 2017 EF 15-20% in 2020  Severer RV dysfunction.   ROS: All systems negative except as listed in HPI, PMH and Problem List.  SH:  Social History   Socioeconomic History  . Marital status: Married    Spouse name: Not on file  . Number of children: 3  . Years of education: Not on file  . Highest education level: Not on file  Occupational History  . Occupation: Retired  Scientific laboratory technician  . Financial resource strain: Not on file  . Food insecurity    Worry: Not on file    Inability: Not on file  . Transportation needs    Medical: No    Non-medical: No  Tobacco Use  . Smoking status: Former Smoker    Packs/day: 0.25    Years: 20.00    Pack years: 5.00    Types: Cigarettes  . Smokeless tobacco: Never Used  . Tobacco comment: Quit June 2017, patient states stil smoking some 1 or 2 cigarettes per day.  Substance and Sexual Activity  . Alcohol use: Yes    Alcohol/week: 0.0 standard drinks  Comment: rarely  . Drug use: No  . Sexual activity: Not on file  Lifestyle  . Physical activity    Days per week: Not on file    Minutes per session: Not on file  . Stress: Not on file  Relationships  . Social Herbalist on phone: Not on file    Gets together: Not on file    Attends religious service: Not on file    Active member of club or organization: Not on file    Attends meetings of clubs or organizations: Not on file    Relationship status: Not on file  . Intimate partner violence    Fear of current or ex partner: Not on file    Emotionally abused: Not on file    Physically abused: Not on file    Forced sexual activity: Not on file  Other Topics Concern  . Not on file  Social  History Narrative  . Not on file    FH:  Family History  Problem Relation Age of Onset  . Diabetes Mellitus II Mother   . Heart disease Brother        before age 54  . Colon cancer Neg Hx   . Esophageal cancer Neg Hx   . Stomach cancer Neg Hx   . Anesthesia problems Neg Hx   . Hypotension Neg Hx   . Malignant hyperthermia Neg Hx   . Pseudochol deficiency Neg Hx     Past Medical History:  Diagnosis Date  . Allergy   . Atrial fibrillation ([place])   . Cataract   . CHF (congestive heart failure) ([place])   . Chronic kidney disease    Renal Insuffiecny  . Colon polyps 2012   Colonoscopy  . Diabetes mellitus    type 2  . Diverticulosis 2012   Colonoscopy   . Hyperglycemia 04/2019  . Hyperlipidemia   . Hypertension   . LBBB (left bundle branch block)   . Pneumonia 2015   3 times  . Stroke [facility]) 2015    Current Outpatient Medications  Medication Sig Dispense Refill  . aspirin EC 81 MG EC tablet Take 1 tablet (81 mg total) by mouth daily.    . carvedilol (COREG) 3.125 MG tablet Take 1 tablet (3.125 mg total) by mouth 2 (two) times daily. 60 tablet 6  . clopidogrel (PLAVIX) 75 MG tablet Take 75 mg by mouth daily.    . feeding supplement, GLUCERNA SHAKE, (GLUCERNA SHAKE) LIQD Take 237 mLs by mouth 3 (three) times daily between meals. (Patient not taking: Reported on 05/27/2019) 237 mL 30  . glimepiride (AMARYL) 1 MG tablet Take 0.5 mg by mouth 2 (two) times daily.     . Insulin Detemir (LEVEMIR) 100 UNIT/ML Pen Inject 10 Units into the skin at bedtime. 15 mL 0  . Insulin Pen Needle 32G X 4 MM MISC Use as directed daily at bedtime. 60 each 0  . iron polysaccharides (FERREX 150) 150 MG capsule Take 1 capsule (150 mg total) by mouth daily. 30 capsule 0  . isosorbide-hydrALAZINE (BIDIL) 20-37.5 MG tablet Take 0.5 tablets by mouth 3 (three) times daily. 45 tablet 6  . Multiple Vitamin (MULTIVITAMIN WITH MINERALS) TABS tablet Take 1 tablet by mouth daily.    . simvastatin (ZOCOR) 40 MG  tablet Take 40 mg by mouth daily.     Marland Kitchen torsemide (DEMADEX) 20 MG tablet Take 1 tablet (20 mg total) by mouth every other day. 15 tablet 0  No current facility-administered medications for this encounter.       Vitals:   06/03/19 0918  BP: 128/70  Pulse: 77  SpO2: 96%  Weight: 67.9 kg (149 lb 12.8 oz)   Wt Readings from Last 3 Encounters:  05/22/19 60.1 kg (132 lb 7.9 oz)  04/18/19 65.8 kg (145 lb)  04/04/19 66.4 kg (146 lb 6.4 oz)    PHYSICAL EXAM: General:  Elderly. Thin No resp difficulty.  Wife present.  HEENT: normal Neck: supple. JVP 6. Carotids 2+ bilat; no bruits. No lymphadenopathy or thryomegaly appreciated. Cor: PMI nondisplaced. Regular rate & rhythm. No rubs, gallops or murmurs. Lungs: clear Abdomen: soft, nontender, nondistended. No hepatosplenomegaly. No bruits or masses. Good bowel sounds. Extremities: no cyanosis, clubbing, rash, edema Neuro: alert & orientedx3, cranial nerves grossly intact. moves all 4 extremities w/o difficulty. Affect pleasant   ASSESSMENT & PLAN: 1. Chronic biventricular KQA:SUORVIF with a history of non-ischemic cardiomyopathy with EF 25-30% in 2016, improved to 35-40% on echo in 2017.   - Echo  01/2019 revealed EF 15-20%, G3DD, and severe RV hypokinesis.  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 LBBB may also be playing a role - Improved NYHA II-III Volume status stable. - Volume status much improved. Continue torsemide 20 daily - Increase carvedilol back to 6.25 mg twice a day. - Continue Bidil 0.5 tab tid. [name] was hesitant for [place] to increase given CKD and some episodes of low BP - Off spironolactone.due to CKD - LBBB may also be playing a role and need to consider CRT-D though he may be too far along.  - I will see back in 2 months with repeat echo  2. CAD: - Cath 02/07/19 with likely high-grade distal LM lesion.  - Case has been reviewed with Dr. Prescott Gum who has reviewed images. LAD only graftable vessel and given severe RV  dysfunction, advanced age, fraility and CKD not felt to be CABG candidate. Case reviewed with Dr. Burt Knack with regard to possibility of LM stenting. It is highly calcified lesion and with severely reduced EF would need probable Impella support but PAD likely precludes. Additionally, LV was down prior to CAD so may not improve with stenting. I suspect medical management may be best.  - No s/s ischemia - On ASA/Plavix. No bleeding  - Continue statin    3.CKD3: - Creatinine baseline 1.9-2.1,  - He now follows with [name].   - Recheck today  4. History of CVA: - He is on ASA 81, Plavix, and statin.   6. DM type 2, poorly controlled: - Per primary. Hgba1c 15 in 8/20 - No SGLT2i with GFR < 30  Glori Bickers  MD 3:48 PM

## 2019-06-03 ENCOUNTER — Other Ambulatory Visit: Payer: Self-pay

## 2019-06-03 ENCOUNTER — Ambulatory Visit (HOSPITAL_COMMUNITY)
Admission: RE | Admit: 2019-06-03 | Discharge: 2019-06-03 | Disposition: A | Discharge status: Home / Self Care | Payer: Medicare HMO | Source: Ambulatory Visit | Attending: Internal Medicine | Admitting: Internal Medicine

## 2019-06-03 ENCOUNTER — Encounter (HOSPITAL_COMMUNITY): Payer: Self-pay | Admitting: Internal Medicine

## 2019-06-03 VITALS — BP 128/70 | HR 77 | Wt 149.8 lb

## 2019-06-03 DIAGNOSIS — E1165 Type 2 diabetes mellitus with hyperglycemia: Secondary | ICD-10-CM | POA: Diagnosis not present

## 2019-06-03 DIAGNOSIS — E1136 Type 2 diabetes mellitus with diabetic cataract: Secondary | ICD-10-CM | POA: Diagnosis not present

## 2019-06-03 DIAGNOSIS — E785 Hyperlipidemia, unspecified: Secondary | ICD-10-CM | POA: Diagnosis not present

## 2019-06-03 DIAGNOSIS — Z8249 Family history of ischemic heart disease and other diseases of the circulatory system: Secondary | ICD-10-CM | POA: Diagnosis not present

## 2019-06-03 DIAGNOSIS — Z8673 Personal history of transient ischemic attack (TIA), and cerebral infarction without residual deficits: Secondary | ICD-10-CM | POA: Insufficient documentation

## 2019-06-03 DIAGNOSIS — Z833 Family history of diabetes mellitus: Secondary | ICD-10-CM | POA: Insufficient documentation

## 2019-06-03 DIAGNOSIS — Z8719 Personal history of other diseases of the digestive system: Secondary | ICD-10-CM | POA: Diagnosis not present

## 2019-06-03 DIAGNOSIS — Z79899 Other long term (current) drug therapy: Secondary | ICD-10-CM | POA: Insufficient documentation

## 2019-06-03 DIAGNOSIS — N183 Chronic kidney disease, stage 3 unspecified: Secondary | ICD-10-CM

## 2019-06-03 DIAGNOSIS — Z794 Long term (current) use of insulin: Secondary | ICD-10-CM | POA: Diagnosis not present

## 2019-06-03 DIAGNOSIS — I130 Hypertensive heart and chronic kidney disease with heart failure and stage 1 through stage 4 chronic kidney disease, or unspecified chronic kidney disease: Secondary | ICD-10-CM | POA: Diagnosis present

## 2019-06-03 DIAGNOSIS — Z87891 Personal history of nicotine dependence: Secondary | ICD-10-CM | POA: Diagnosis not present

## 2019-06-03 DIAGNOSIS — I428 Other cardiomyopathies: Secondary | ICD-10-CM | POA: Insufficient documentation

## 2019-06-03 DIAGNOSIS — I5022 Chronic systolic (congestive) heart failure: Secondary | ICD-10-CM | POA: Diagnosis not present

## 2019-06-03 DIAGNOSIS — E1122 Type 2 diabetes mellitus with diabetic chronic kidney disease: Secondary | ICD-10-CM | POA: Diagnosis not present

## 2019-06-03 DIAGNOSIS — Z7982 Long term (current) use of aspirin: Secondary | ICD-10-CM | POA: Diagnosis not present

## 2019-06-03 DIAGNOSIS — I2510 Atherosclerotic heart disease of native coronary artery without angina pectoris: Secondary | ICD-10-CM | POA: Diagnosis not present

## 2019-06-03 DIAGNOSIS — Z7902 Long term (current) use of antithrombotics/antiplatelets: Secondary | ICD-10-CM | POA: Insufficient documentation

## 2019-06-03 DIAGNOSIS — I447 Left bundle-branch block, unspecified: Secondary | ICD-10-CM | POA: Insufficient documentation

## 2019-06-03 DIAGNOSIS — E1151 Type 2 diabetes mellitus with diabetic peripheral angiopathy without gangrene: Secondary | ICD-10-CM | POA: Insufficient documentation

## 2019-06-03 LAB — COMPREHENSIVE METABOLIC PANEL
ALT: 67 U/L — ABNORMAL HIGH (ref 0–44)
AST: 47 U/L — ABNORMAL HIGH (ref 15–41)
Albumin: 3.2 g/dL — ABNORMAL LOW (ref 3.5–5.0)
Alkaline Phosphatase: 61 U/L (ref 38–126)
Anion gap: 7 (ref 5–15)
BUN: 61 mg/dL — ABNORMAL HIGH (ref 8–23)
CO2: 26 mmol/L (ref 22–32)
Calcium: 8.7 mg/dL — ABNORMAL LOW (ref 8.9–10.3)
Chloride: 101 mmol/L (ref 98–111)
Creatinine, Ser: 2.26 mg/dL — ABNORMAL HIGH (ref 0.61–1.24)
GFR calc Af Amer: 32 mL/min — ABNORMAL LOW (ref >60)
GFR calc non Af Amer: 27 mL/min — ABNORMAL LOW (ref >60)
Glucose, Bld: 387 mg/dL — ABNORMAL HIGH (ref 70–99)
Potassium: 5.1 mmol/L (ref 3.5–5.1)
Sodium: 134 mmol/L — ABNORMAL LOW (ref 135–145)
Total Bilirubin: 0.4 mg/dL (ref 0.3–1.2)
Total Protein: 6.4 g/dL — ABNORMAL LOW (ref 6.5–8.1)

## 2019-06-03 LAB — BRAIN NATRIURETIC PEPTIDE: B Natriuretic Peptide: 239.5 pg/mL — ABNORMAL HIGH (ref 0.0–100.0)

## 2019-06-03 MED ORDER — CARVEDILOL 6.25 MG PO TABS: 6.2500 mg | ORAL_TABLET | Freq: Two times a day (BID) | ORAL | 6 refills | Status: DC

## 2019-06-03 NOTE — Patient Instructions (Signed)
Increase Carvedilol to 6.25 mg Twice daily   Labs done today, we will call you for abnormal results  Your physician recommends that you schedule a follow-up appointment in: 2 months with echocardiogram  At the [facility], you and your health needs are our priority. As part of our continuing mission to provide you with exceptional heart care, we have created designated Provider Care Teams. These Care Teams include your primary Cardiologist (physician) and Advanced Practice Providers (APPs- Physician Assistants and Nurse Practitioners) who all work together to provide you with the care you need, when you need it.   You may see any of the following providers on your designated Care Team at your next follow up: Marland Kitchen Dr Glori Bickers . Dr Loralie Champagne . Darrick Grinder, NP   Please be sure to bring in all your medications bottles to every appointment.

## 2019-06-03 NOTE — Addendum Note (Signed)
Encounter addended by: Scarlette Calico, RN on: 06/03/2019 10:10 AM  Actions taken: Visit diagnoses modified, Pharmacy for encounter modified, Clinical Note Signed, Charge Capture section accepted, Order list changed, Diagnosis association updated

## 2019-06-05 ENCOUNTER — Other Ambulatory Visit: Payer: Self-pay

## 2019-06-05 NOTE — Patient Outreach (Signed)
[place] [facility]) Care Management  06/05/2019  Dquan Cortopassi [birth year] [id number]   Social work referral received from [facility] [employer].  "Referral to Social Worker to send Advanced Directive packet, follow up on documents completion, and provide smoking cessation community resources. Wife states patient still smokes 1 - 2 cigarettes per day, and has be encouraged by MDs to stop smoking." Successful outreach to patient today.  He requested that I speak with his wife.   Discussed HC POA and Living Will.  Will mail Advance Directive EMMI and packet. Inquired about smoking cessation resources to which his wife responded "that is up to Mr. Lilley"  She did ask that resources be mailed in case he is interested.  Will follow up within the next two weeks to ensure receipt of resources.  Ronn Melena, BSW Social Worker [phone]

## 2019-06-06 ENCOUNTER — Other Ambulatory Visit: Payer: Self-pay | Admitting: *Deleted

## 2019-06-06 NOTE — Patient Outreach (Addendum)
[facility]) Care Management  06/06/2019  [name] [birth year] [id number]   Subjective: Telephone call to patient's home / mobile number, spoke with patient, and HIPAA verified.  Discussed [facility] Liaison referral follow up, patient voiced understanding, and is in agreement to follow up at a later time.  Patient states he is doing pretty good, feels a little nauseated at this time, feels it is related to him eating a late breakfast, wife took blood sugar this morning was 300 range, and not sure of the exact reading.   States he is not up to talking at this time, wife is currently at work, and he requested a call back at a later time.   Per chart review, patient had follow up appointment with cardiologist on 06/03/2019 and with primary MD on 05/29/2019.  Patient states he is aware of signs/ symptoms to report, how to reach provider if needed after hours, when to go to ED, and / or call 911.  States he is very appreciative of the follow up and is in agreement to receive [place] Management services.    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 Patient also has a history of chronic kidney disease stage 3, hypertension,Community acquired pneumonia of left lower lobe of lung, CAD,LBBB (left bundle branch block),Acute metabolic encephalopathy,Protein-calorie malnutrition, severe,and hyperlipidemia.     Assessment: Received [name] [facility] Liaison /MD referral on 05/26/2019. Referral source: [name]. Referral reason: complex diabetes management. Screening follow up completed.   RNCM will follow patient for diabetes care  coordination, has referred patient to [place] Management Social Worker for Advanced Directives follow up, and Smoking Cessation community resources, follow up pending contact with patient or patient's wife.     Plan: RNCM has referred patient to [place] Management Social Worker for Advanced Directives follow up, and Smoking Cessation community resources. RNCM has sent patient successful outreach letter,welcome packet,THN pamphlet, and magnet. RNCM has sentpatient'sprimaryMD involvement letterand routed assessment. RNCM will send unsuccessful outreach letter, [facility] pamphlet, RCM will follow up with patient or patient's wife within 83 business days, for diabetes care coordination follow up, and will proceed with case closure, within 10 business days if no return call.      Emerie Vanderkolk [name], BSN, [place] Management [facility] Telephonic CM Phone: [phone] Fax: [phone]

## 2019-06-08 DIAGNOSIS — H524 Presbyopia: Secondary | ICD-10-CM | POA: Diagnosis not present

## 2019-06-09 ENCOUNTER — Ambulatory Visit: Payer: Self-pay | Admitting: *Deleted

## 2019-06-09 DIAGNOSIS — R69 Illness, unspecified: Secondary | ICD-10-CM | POA: Diagnosis not present

## 2019-06-10 ENCOUNTER — Ambulatory Visit: Payer: Medicare HMO | Admitting: *Deleted

## 2019-06-10 ENCOUNTER — Other Ambulatory Visit: Payer: Self-pay

## 2019-06-10 NOTE — Patient Outreach (Signed)
[place] [facility]) Care Management  06/10/2019  Izayiah Tibbitts 1941/05/12 [id number]   Referral source:  Hospital liaision, Lorriane Ajel Diagnosis: Diabetes Date of admission: 05/21/19 Date of discharge: 05/23/19 Facility:  [place]: [place] [facility]  Telephone call to patient for follow up.  HIPAA verified. RNCM explained reason for call.  Patient states he is doing ok off and on.  He reports he is not having the nausea as frequently as he was.  He states he his having nausea approximately 1 time per day.   Patient states has not checked his blood sugar this morning.  He reports checking it yesterday evening but unsure of the number. He states," It was good."  Patient states he is recording his blood sugars when he takes them.  RNCM discussed importance of patient checking blood sugars consistently and recording. Patient verbalized understanding.  Patient states he continues to take his medications as prescribed.  Patient states he is unsure if he has received the [facility] care management packet/ Advanced Directive.  He states, " my wife may have gotten it."  Patient denies any further needs at this time.  Patient verbally agreed to next telephone outreach with [facility].   PLAN:  RNCM will follow up with patient within 1 week  Quinn Plowman RN,BSN,CCM [facility] Telephonic  [phone]

## 2019-06-12 DIAGNOSIS — I129 Hypertensive chronic kidney disease with stage 1 through stage 4 chronic kidney disease, or unspecified chronic kidney disease: Secondary | ICD-10-CM | POA: Diagnosis not present

## 2019-06-12 DIAGNOSIS — E1122 Type 2 diabetes mellitus with diabetic chronic kidney disease: Secondary | ICD-10-CM | POA: Diagnosis not present

## 2019-06-12 DIAGNOSIS — N183 Chronic kidney disease, stage 3 (moderate): Secondary | ICD-10-CM | POA: Diagnosis not present

## 2019-06-12 MED FILL — LEVEMIR FLEXTOUCH 100 UNITS: 100 | 25 days supply | Qty: 3 | Fill #0

## 2019-06-16 MED FILL — FERREX 150 CAPSULE: 150 | 90 days supply | Qty: 90 | Fill #1

## 2019-06-19 ENCOUNTER — Other Ambulatory Visit: Payer: Self-pay

## 2019-06-19 NOTE — Patient Outreach (Signed)
[place]) Care Management  06/19/2019  [name] [birth year] [id number]   Successful follow up call to patient today.  He requested that I speak with his spouse.  Spouse confirmed receipt of Advance Directive packet and smoking cessation resources.   Spouse declined reviewing documents but did inquire about what to do with them once completed.  Informed spouse that she and patient should keep original document but have them uploaded into EHR once completed.   Closing case but did encourage them to call if additional questions or needs arise.  Ronn Melena, BSW Social Worker [phone]

## 2019-06-25 ENCOUNTER — Other Ambulatory Visit (HOSPITAL_COMMUNITY): Payer: Self-pay

## 2019-06-25 MED ORDER — TORSEMIDE 20 MG PO TABS: 20.0000 mg | ORAL_TABLET | ORAL | 3 refills | Status: DC

## 2019-06-25 MED FILL — TORSEMIDE 20 MG TABLET: 20 | 30 days supply | Qty: 15 | Fill #0

## 2019-07-04 ENCOUNTER — Other Ambulatory Visit: Payer: Self-pay | Admitting: *Deleted

## 2019-07-04 DIAGNOSIS — N2581 Secondary hyperparathyroidism of renal origin: Secondary | ICD-10-CM | POA: Diagnosis not present

## 2019-07-04 DIAGNOSIS — N189 Chronic kidney disease, unspecified: Secondary | ICD-10-CM | POA: Diagnosis not present

## 2019-07-04 DIAGNOSIS — I509 Heart failure, unspecified: Secondary | ICD-10-CM | POA: Diagnosis not present

## 2019-07-04 DIAGNOSIS — E785 Hyperlipidemia, unspecified: Secondary | ICD-10-CM | POA: Diagnosis not present

## 2019-07-04 DIAGNOSIS — N183 Chronic kidney disease, stage 3 unspecified: Secondary | ICD-10-CM | POA: Diagnosis not present

## 2019-07-04 DIAGNOSIS — H3500 Unspecified background retinopathy: Secondary | ICD-10-CM | POA: Diagnosis not present

## 2019-07-04 DIAGNOSIS — J449 Chronic obstructive pulmonary disease, unspecified: Secondary | ICD-10-CM | POA: Diagnosis not present

## 2019-07-04 DIAGNOSIS — I2510 Atherosclerotic heart disease of native coronary artery without angina pectoris: Secondary | ICD-10-CM | POA: Diagnosis not present

## 2019-07-04 DIAGNOSIS — E1122 Type 2 diabetes mellitus with diabetic chronic kidney disease: Secondary | ICD-10-CM | POA: Diagnosis not present

## 2019-07-04 DIAGNOSIS — D631 Anemia in chronic kidney disease: Secondary | ICD-10-CM | POA: Diagnosis not present

## 2019-07-04 DIAGNOSIS — E875 Hyperkalemia: Secondary | ICD-10-CM | POA: Diagnosis not present

## 2019-07-04 NOTE — Patient Outreach (Signed)
[facility]) Care Management  07/04/2019  [name] 25-Jan-1941 [id number]   Subjective: Telephone call to patient's home / mobile number, spoke with patient, and HIPAA verified.  Patient states he is doing okay, remembers speaking with this RNCM in the past, and wife / designated party release is currently not available.   States his wife has taken his blood sugar today, still running above 200, ranges 210 -212 per his memory, unable to verify exact readings because he does not know where the documentation is located at this time.  States he is taking a nutritional supplement but does not know the name of it.   Patient states he does not have any education material, transition of care, or pharmacy needs at this time. States he is very appreciative of the follow up and is in agreement to continue to receive [place] Management services.    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 Patient also has a history of chronic kidney disease stage 3, hypertension,Community acquired pneumonia of left lower lobe of lung, CAD,LBBB (left bundle branch block),Acute metabolic encephalopathy,Protein-calorie malnutrition, severe,and hyperlipidemia.     Assessment: Received [name] [facility] Liaison /MD referral on8/31/2020. Referral source: [name]. Referral reason: complex diabetes management. Screening follow up completed.    [facility] Care Management Social Worker follow up for Advanced Directives and Smoking Cessation community resources, has been completed.   RNCM will continue to follow patient for diabetes care coordination.      Plan: RNCM will call patient  for telephone outreach attempt, within 7 business days, complex diabetes management.      Scherry Laverne [name], BSN, [place] Management [facility] Telephonic CM Phone: [phone] Fax: [phone]

## 2019-07-07 DIAGNOSIS — R69 Illness, unspecified: Secondary | ICD-10-CM | POA: Diagnosis not present

## 2019-07-09 DIAGNOSIS — I130 Hypertensive heart and chronic kidney disease with heart failure and stage 1 through stage 4 chronic kidney disease, or unspecified chronic kidney disease: Secondary | ICD-10-CM | POA: Diagnosis not present

## 2019-07-09 DIAGNOSIS — Z794 Long term (current) use of insulin: Secondary | ICD-10-CM | POA: Diagnosis not present

## 2019-07-09 DIAGNOSIS — N184 Chronic kidney disease, stage 4 (severe): Secondary | ICD-10-CM | POA: Diagnosis not present

## 2019-07-09 DIAGNOSIS — E1122 Type 2 diabetes mellitus with diabetic chronic kidney disease: Secondary | ICD-10-CM | POA: Diagnosis not present

## 2019-07-11 ENCOUNTER — Other Ambulatory Visit: Payer: Self-pay | Admitting: *Deleted

## 2019-07-11 NOTE — Patient Outreach (Signed)
[place] [facility]) Care Management  07/11/2019  [name] June 07, 1941 [id number]   Subjective: Telephone call to patient's home number, spoke with patient, and HIPAA verified.  Patient states he is doing pretty good, remembers speaking with this RNCM in the past and he Is currently outside in the yard, requested RNCM complete follow up assessment with his  wife / designated party release who is currently not available and requested call back at a later time.    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 Patient also has a history of chronic kidney disease stage 3, hypertension,Community acquired pneumonia of left lower lobe of lung, CAD,LBBB (left bundle branch block),Acute metabolic encephalopathy,Protein-calorie malnutrition, severe,and hyperlipidemia.     Assessment: Received [name] [facility] Liaison /MD referral on8/31/2020. Referral source: [name]. Referral reason: complex diabetes management. Screening follow up completed. [name] Care Management Social Worker follow up for Advanced Directives and Smoking Cessation community resources, has been completed.   RNCM will continue to follow patient for diabetes care coordination.      Plan: RNCM will call patient's wife for 2nd  telephone outreach attempt, within 4 business days, complex diabetes management.      Kenzo Ozment [name], BSN, [place] Management [facility] Telephonic CM Phone: [phone] Fax: [phone]

## 2019-07-12 MED FILL — LEVEMIR FLEXTOUCH 100 UNITS: 100 | 30 days supply | Qty: 6 | Fill #0

## 2019-07-14 ENCOUNTER — Other Ambulatory Visit: Payer: Self-pay | Admitting: *Deleted

## 2019-07-14 NOTE — Patient Outreach (Signed)
[place] [facility]) Care Management  07/14/2019  [name] 01-Aug-1941 [id number]   Subjective: Telephone call to patient's home number, spoke with patient's wife/ designated party release [name]), she stated patient's name, date of birth, and address.  Wife states she remembers speaking with this RNCM in the past, states she, and patient are questioning the value of weekly [place] Management  calls,  answered her questions regarding value of [facility] Care Management services, wife voices understanding, and is in agreement to referral to [facility] [place] for diabetes disease management.   Wife states patient is feeling fine, blood sugar was 379 this am, no signs/ symptoms of elevated blood sugar, was unable to retake in 45 minutes per her normal protocol, had to go to work, wife is wondering if glucometer is accurate, and planning to take glucometer to MD's office to be checked at next appointment on 07/22/2019.  States since patient has been home from hospital, will retake blood sugar  within 45 minutes of glucometer reading above 200 and it is usually below 200 without any interventions.  States she reports all blood sugar readings to MD office as needed and receives treatment guidance.  States patient is now on insulin and has biweekly follow up visits with primary MD.  Quintella Baton last MD appointment was on 07/11/2019 and appointment went well.  Wife states patient's goal blood sugar is below 200 which is lower than his previous baseline and she is planning to follow up with primary MD regarding A1C goal.   States patient is taking diabetic Boost nutritional supplement, eating much better, has gained 16 lbs per last nephrologist visit, and has had no fluid gain.   Wife states patient does not have any education material, transition of care, care coordination, transportation, community resource, or pharmacy needs at this time.  States she is very appreciative of the follow up  and is in agreement to receive [place] Management services on patient's behalf.     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 Patient also has a history of chronic kidney disease stage 3, hypertension,Community acquired pneumonia of left lower lobe of lung, CAD,LBBB (left bundle branch block),Acute metabolic encephalopathy,Protein-calorie malnutrition, severe,and hyperlipidemia.     Assessment: Received [name] [facility] Liaison /MD referral on8/31/2020. Referral source: [name]. Referral reason: complex diabetes management. Screening follow up completed. [facility] Care Management Social Worker follow up for Advanced Directives and Smoking Cessation community resources, has been completed.   Diabetes care coordination completed and will refer patient to [place] for diabetes disease management .      Plan: RNCM will refer patient to [place] for diabetes disease management .       Bianka Liberati [name], BSN, [place] Management [facility] Telephonic CM Phone: [phone] Fax: [phone]

## 2019-07-15 ENCOUNTER — Other Ambulatory Visit: Payer: Self-pay | Admitting: *Deleted

## 2019-07-15 NOTE — Patient Outreach (Addendum)
[name]) Care Management  07/15/2019  [name] 1940-10-15 [id number]   Received in-basket message, patient referred back to this RNCM for diabetes disease management due to patient's most recent A1C is 14.9 and patient cannot be followed by [name] at this time.  Case discussed with [name] at [name].   Left HIPAA compliant voicemail message for patient's primary MD and requested call back. Received HIPAA compliant voicemail message from [facility] at patient's primary MD's office (Dr. Dagmar Hait), stated the following: patient's last A1C was 14.9 on 05/29/2019, CBG was 287 on 06/12/2019, and patient next follow up appointment is on 07/22/2019 with [name] Nurse Practitioner.   RNCM will call patient or patient's wife for telephone outreach attempt within  10 business day, diabetes disease management, and proceed with case closure, within 10 business days if no return call.      [name] [name], BSN, [place] Management [facility] Telephonic CM Phone: [phone] Fax: [phone]

## 2019-07-22 DIAGNOSIS — E1122 Type 2 diabetes mellitus with diabetic chronic kidney disease: Secondary | ICD-10-CM | POA: Diagnosis not present

## 2019-07-22 DIAGNOSIS — N184 Chronic kidney disease, stage 4 (severe): Secondary | ICD-10-CM | POA: Diagnosis not present

## 2019-07-25 ENCOUNTER — Other Ambulatory Visit: Payer: Self-pay | Admitting: *Deleted

## 2019-07-25 NOTE — Patient Outreach (Addendum)
[facility]) Care [name]  07/25/2019  [name] 1940/10/10 [id number]   Case discussed with [name] at [place] [name] and Quality Team [name] and [name]).  Advised team of this RNCM's interactions with patient and wife to date.  Discussed next steps for complex short term diabetes program follow up.   Per team concensus RNCM will re-enroll patient in short term diabetes program at next scheduled patient encounter on 07/28/2019, will add comment to program start date field that program closed in error, start date for the program will remain 05/27/2019, and acuity will be level 3 case [name].   RNCM will follow up with primary MD's [name] to verify the following: target A1C and blood sugar ranges, what changed for patient in July of 2020 that caused elevation in patient's A1C, and what is the outcome of the blood sugar monitor machine check during 07/22/2019 [name] visit?   RNCM will continue to follow up with patient per patient's requested timeframe, verify established patient,  and RNCM follow up items at each patient encounter.    Received voicemail message from [name] (patient's primary MD), states patient and wife attended [name] follow up visit with nurse practitioner on 07/22/2019, patient's A1C was 12/6 and RNCM may call [name] with any questions or updates as needed.   Telephone to patient's primary MD's [name] ([name]), left HIPAA compliant message for [name], and request call. Telephone call from [name] at [name] [name], patient's contact information verified, advised [name] of this RNCM's conversations with patient, patient's wife, and [name] Care [name] Quality Team.   [name] voices understanding, states patient and wife have significant compliance issue, has been an ongoing issue for several years.   States patient's next follow up visit will  be with the practice pharmacist on 08/26/2019.    MD's strategy has been to bring patient into the [name] as often as possible and as needed to address compliance issues.   States patient and wife's reactions are frequently minimized and inconsistent with patients actual clinical picture during in person evaluations.  States for example during  05/21/2019 [name] visit, patient's blood sugar was 776 and A1C was > 15 and too high to read.  States patient has had a cumulative /snowball effect of multiple medical problems and behaviors ( such as: declining kidney function, eating foods that he should not eat due to diet restrictions, congestive heart failure,  Pneumonia) over the last 6 months that has impacted his diabetes [name]. States wife and patient have been instructed on numerous occassions to write down patient's blood sugar reading, given a log book for documentation, given new meter ( One Touch Verio), and educated on the importance.  [name] states wife can pick up another new meter at anytime if needed, from [name] [name], and last meter was given on 05/29/2019.   States patient's daughter [name]) also provides assistance to patient as needed with diabetes [name].    Wife has been instructed to report patient's blood sugar readings of < 120 and > 300 to MD's [name], target A1C is < 8.0,  and continue to follow up for frequent [name] visits.  States patient's last blood sugar reading was 287 and Levemir insulin dose was increased to 22 units at last visit on 07/22/2019.   States patient has reported in the past  he feels terrible when blood sugars are < 200.   RNCM advised will continue to collaborate with primary  MD's [name], contact patient / wife as frequently as patient / wife will allow, and follow up with primary MD's [name] as needed.   [name] in agreement with plan and appreciative of follow up. RNCM will call patient or patient's wife for telephone outreach attempt within  10 business day, diabetes disease [name], and proceed with  case closure, within 10 business days if no return call.     [name], BSN, [place] [name] Telephonic CM Phone: [phone] Fax: [phone]

## 2019-07-28 ENCOUNTER — Other Ambulatory Visit: Payer: Self-pay | Admitting: *Deleted

## 2019-07-28 NOTE — Patient Outreach (Signed)
[name]) Care Management  07/28/2019  [name] March 19, 1941 [id number]   Subjective: Telephone call to patient's home number, spoke with patient, states his wife [name] ) is not available, currently not at home, and left HIPAA compliant message, and requested call back.   He requested per previous consent, that RNCM speaks with wife for future follow ups.     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 Patient also has a history of chronic kidney disease stage 3, hypertension,Community acquired pneumonia of left lower lobe of lung, CAD,LBBB (left bundle branch block),Acute metabolic encephalopathy,Protein-calorie malnutrition, severe,and hyperlipidemia.     Assessment: Received [name] Liaison /MD referral on8/31/2020. Referral source: [name]. Referral reason: complex diabetes management. Screening follow up completed.[name] Care Management Social Workerfollow upfor Advanced Directives and Smoking Cessation community resources,has been completed.RNCM willcontinue tofollow patient for diabetes care coordination/ complex case management, pending contact with patient's wife.    Plan:RNCM will call patient's wife for 2nd  telephone outreach attempt, within 4 business days,complex diabetes management.     [name]. Annia Friendly, BSN, [address] [place] [address] Management [facility] Telephonic CM Phone: [phone] Fax: [phone]

## 2019-07-29 ENCOUNTER — Other Ambulatory Visit: Payer: Self-pay | Admitting: *Deleted

## 2019-07-29 NOTE — Patient Outreach (Addendum)
[place] [facility]) Care Management  07/29/2019  [name] 05-08-1941 [id number]   Subjective: Telephone call to patient's home / mobile number, spoke with patient's wife/ designated party release, stated patient's name, date of birth, and address.   Wife remembers speaking with this RNCM in the past.  RNCM advised wife of RNCM's follow up with patient's primary MD's office and [place] Management Team.  Discussed plan for this RNCM to continue to follow patient for diabetes management, instead of Health Coach,  wife voices understanding, and asked RNCM to explain the value of the program. Discussed program value (disease education, nutritional community resources, care coordination with providers, caregiver support, and other resources), wife voiced understanding, wife is in agreement continue with services on a call by case basis.  She declined nutritional community resources, states she has worked for 41 years in diabetes nutritional education, is knowledgeable about what patient should be eating, how to cook healthy for patient, and states they do not need this resource.   Discussed what changes patient experienced in July of 2020 that may be affecting patient's blood sugars per MD's office (many health issues / complications),  wife gave insight, wife voices understanding, and is inagreement.  Wife states patient's issue is that he was losing weight and his appetite earlier this year, now appetite has returned,  he is snacking on foods that he should not, this has been discussed with patient, aware of what he needs to do, no assistance needed regarding this issue.   Wife was unaware of what patient's target blood sugar and A1C, advised of RNCM's conversation with [name] at primary MD's office.   Wife advised per MD's office patient's blood sugars should be less than 200 and and A1C less than 8.0, wife voices understanding, and she will follow up with MD's office as needed with elevated  readings or any other issues.   Wife is aware of patient's next follow up appointment on 08/26/2019 but was not aware that the appointment was scheduled with pharmacist at MD's office, and will follow up with MD's office with any questions.  States she was not allowed to attend patient's last office visit, she was in the car while patient was in the appointment,  she is assuming it was due to the office's Covid restrictions, is not sure of the rationale, this is the first visit that she has not been able to accompany the patient, she did not receive an update on how the visit went from the office, voiced some concerns regarding this, decline for this RNCM to advise MD's office of her concerns, and coordinate wife's visitation or updates for future appointments.  Wife states patient did take blood glucose meter to last appointment to be checked to verify that it is operating properly and patient advised that no one was available to check the machine.  Wife in agreement for this RNCM to contact MD's office to coordinate meter check on 08/01/2019 and will l eave a message with patient for wife to confirm appointment date / time for meter check.   Wife states she is off from work on 08/01/2019 and can take meter at anytime to be checked.  Wife states patient is doing much better overall, blood sugar range over the last week was 114 - 228, blood sugars affect how patient feels, and she is aware of patient's last AC1 value.  Wife states patient does not have any education material, transition of care, transportation, [employer], or pharmacy needs at  this time. Wife requested decreased frequency of calls, in agreement to next follow up call on  11/4/ 2020 or 07/31/2019, and 08/28/2019, after next MD office visit, and wife will reassess their needs, will advised RNCM if continued services needed at next patient/ wife encounter.  Telephone call to patient's primary MD's office on 07/30/2019,  spoke with  Receptionist,  states Dr. Dagmar Hait, and his assistant are out of the office today, transferred to on-call providers voicemail, left message, and requested call back, regarding meter check appointment coordination.    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 Patient also has a history of chronic kidney disease stage 3, hypertension,Community acquired pneumonia of left lower lobe of lung, CAD,LBBB (left bundle branch block),Acute metabolic encephalopathy,Protein-calorie malnutrition, severe,and hyperlipidemia.     Assessment: Received [name] [facility] Liaison /MD referral on8/31/2020. Referral source: [name]. Referral reason: complex diabetes management. Screening follow up completed.[facility] Care Management Social Workerfollow upfor Advanced Directives and Smoking Cessation community resources,has been completed.RNCM willcontinue tofollow patient for diabetes short term case management.      Plan:RNCM will call patient's wife fortelephone outreach attempt, within 25business days,per wife's request, for short term diabetes case management.     [name], BSN, [place] Management [facility] Telephonic CM Phone: [phone] Fax: [phone]

## 2019-07-31 ENCOUNTER — Other Ambulatory Visit: Payer: Self-pay | Admitting: *Deleted

## 2019-07-31 NOTE — Patient Outreach (Addendum)
[place] [facility]) Care Management  07/31/2019  [name] January 19, 1941 [id number]   Subjective: Telephone call to Dr. Danna Hefty office, spoke with [name], advised following up regarding patient's blood sugar meter check and advised this RNCM has not received call back from message left on 07/30/2019.  States Dr. Dagmar [name] is currently out of the office, transferred to covering MD assistant's voicemail, left message, and requested update regarding meter check today prior to 5:00pm.  Received voicemail message from [place] at Dr. Danna Hefty office states she has spoken with [name] (Dr. Danna Hefty assistant) and [name] who is the clinical pharmacist for the practice, assistant [name]), who stated that patient can obtain meter check during appointment with clinical pharmacist by the clinical pharmacist.  Telephone call to patient's home / mobile number, spoke with patient's wife/ designated party release, stated patient's name, date of birth, and address.   Wife remembers speaking with this RNCM in the past. No assessment follow up or care plan review at this time per wife's and patient's previous request, for update on meter testing.   RNCM advised wife of RNCM's follow up with patient's primary MD's office, meter testing can be performed at patient's next office visit on 08/26/2019 with clinical pharmacist, wife declined to make earlier appointment for meter testing.  Wife states no additional needs at this time, appreciative of follow up, and is agreement to next follow up after next office visit. Wife states she will outreach to this [facility] if patient has needs prior to next scheduled outreach.    Objective:Per KPN (Knowledge Performance Now, point of care tool) and chart review,patient hospitalized 05/21/2019 - 05/23/2019 forUncontrolled diabetes mellitus with hyperglycemia, 04/01/2019 - 04/04/2019 for acute respiratory failure, 02/04/2019 -02/10/2019 for Acute on chroniccombined  systolic/diastoliccongestive heart failure. Patient also has a history of chronic kidney disease stage 3, hypertension,Community acquired pneumonia of left lower lobe of lung, CAD,LBBB (left bundle branch block),Acute metabolic encephalopathy,Protein-calorie malnutrition, severe,and hyperlipidemia.     Assessment: Received [name] [facility] Liaison /MD referral on8/31/2020. Referral source: [name]. Referral reason: complex diabetes management. Screening follow up completed.[facility] Care Management Social Workerfollow upfor Advanced Directives and Smoking Cessation community resources,has been completed.RNCM willcontinue tofollow patient for diabetes short term case management.      Plan:RNCM will call patient's wife fortelephone outreach attempt, within 25business days,per wife's request, for short term diabetes case management.     [name] [name], BSN, [place] Management [facility] Telephonic CM Phone: [phone] Fax: [phone]

## 2019-08-05 ENCOUNTER — Ambulatory Visit (HOSPITAL_BASED_OUTPATIENT_CLINIC_OR_DEPARTMENT_OTHER)
Admission: RE | Admit: 2019-08-05 | Discharge: 2019-08-05 | Disposition: A | Discharge status: Home / Self Care | Payer: Medicare HMO | Source: Ambulatory Visit | Attending: Internal Medicine | Admitting: Internal Medicine

## 2019-08-05 ENCOUNTER — Encounter (HOSPITAL_COMMUNITY): Payer: Self-pay | Admitting: Internal Medicine

## 2019-08-05 ENCOUNTER — Other Ambulatory Visit: Payer: Self-pay

## 2019-08-05 ENCOUNTER — Ambulatory Visit (HOSPITAL_COMMUNITY)
Admission: RE | Admit: 2019-08-05 | Discharge: 2019-08-05 | Disposition: A | Discharge status: Home / Self Care | Payer: Medicare HMO | Source: Ambulatory Visit | Attending: Internal Medicine | Admitting: Internal Medicine

## 2019-08-05 VITALS — BP 130/86 | HR 68 | Wt 163.8 lb

## 2019-08-05 DIAGNOSIS — Z79899 Other long term (current) drug therapy: Secondary | ICD-10-CM | POA: Insufficient documentation

## 2019-08-05 DIAGNOSIS — I5022 Chronic systolic (congestive) heart failure: Secondary | ICD-10-CM | POA: Diagnosis not present

## 2019-08-05 DIAGNOSIS — I447 Left bundle-branch block, unspecified: Secondary | ICD-10-CM

## 2019-08-05 DIAGNOSIS — E1122 Type 2 diabetes mellitus with diabetic chronic kidney disease: Secondary | ICD-10-CM | POA: Insufficient documentation

## 2019-08-05 DIAGNOSIS — Z794 Long term (current) use of insulin: Secondary | ICD-10-CM | POA: Insufficient documentation

## 2019-08-05 DIAGNOSIS — Z87891 Personal history of nicotine dependence: Secondary | ICD-10-CM | POA: Diagnosis not present

## 2019-08-05 DIAGNOSIS — I428 Other cardiomyopathies: Secondary | ICD-10-CM | POA: Diagnosis not present

## 2019-08-05 DIAGNOSIS — Z8673 Personal history of transient ischemic attack (TIA), and cerebral infarction without residual deficits: Secondary | ICD-10-CM | POA: Diagnosis not present

## 2019-08-05 DIAGNOSIS — N183 Chronic kidney disease, stage 3 unspecified: Secondary | ICD-10-CM | POA: Diagnosis not present

## 2019-08-05 DIAGNOSIS — Z7902 Long term (current) use of antithrombotics/antiplatelets: Secondary | ICD-10-CM | POA: Diagnosis not present

## 2019-08-05 DIAGNOSIS — E785 Hyperlipidemia, unspecified: Secondary | ICD-10-CM | POA: Diagnosis not present

## 2019-08-05 DIAGNOSIS — Z7982 Long term (current) use of aspirin: Secondary | ICD-10-CM | POA: Insufficient documentation

## 2019-08-05 DIAGNOSIS — I2510 Atherosclerotic heart disease of native coronary artery without angina pectoris: Secondary | ICD-10-CM

## 2019-08-05 DIAGNOSIS — I4891 Unspecified atrial fibrillation: Secondary | ICD-10-CM | POA: Insufficient documentation

## 2019-08-05 DIAGNOSIS — E1136 Type 2 diabetes mellitus with diabetic cataract: Secondary | ICD-10-CM | POA: Insufficient documentation

## 2019-08-05 DIAGNOSIS — I081 Rheumatic disorders of both mitral and tricuspid valves: Secondary | ICD-10-CM | POA: Insufficient documentation

## 2019-08-05 DIAGNOSIS — N1832 Chronic kidney disease, stage 3b: Secondary | ICD-10-CM | POA: Diagnosis not present

## 2019-08-05 DIAGNOSIS — I130 Hypertensive heart and chronic kidney disease with heart failure and stage 1 through stage 4 chronic kidney disease, or unspecified chronic kidney disease: Secondary | ICD-10-CM | POA: Diagnosis not present

## 2019-08-05 LAB — BASIC METABOLIC PANEL
Anion gap: 10 (ref 5–15)
BUN: 36 mg/dL — ABNORMAL HIGH (ref 8–23)
CO2: 25 mmol/L (ref 22–32)
Calcium: 9.4 mg/dL (ref 8.9–10.3)
Chloride: 105 mmol/L (ref 98–111)
Creatinine, Ser: 2.4 mg/dL — ABNORMAL HIGH (ref 0.61–1.24)
GFR calc Af Amer: 29 mL/min — ABNORMAL LOW (ref >60)
GFR calc non Af Amer: 25 mL/min — ABNORMAL LOW (ref >60)
Glucose, Bld: 154 mg/dL — ABNORMAL HIGH (ref 70–99)
Potassium: 4.6 mmol/L (ref 3.5–5.1)
Sodium: 140 mmol/L (ref 135–145)

## 2019-08-05 LAB — BRAIN NATRIURETIC PEPTIDE: B Natriuretic Peptide: 204 pg/mL — ABNORMAL HIGH (ref 0.0–100.0)

## 2019-08-05 MED ORDER — BIDIL 20-37.5 MG PO TABS: 1.0000 | ORAL_TABLET | Freq: Three times a day (TID) | ORAL | 6 refills | Status: DC

## 2019-08-05 MED ORDER — TORSEMIDE 20 MG PO TABS: 20.0000 mg | ORAL_TABLET | ORAL | 6 refills | Status: DC

## 2019-08-05 NOTE — Progress Notes (Signed)
  Echocardiogram 2D Echocardiogram has been performed.  Jennette Dubin 08/05/2019, 10:53 AM

## 2019-08-05 NOTE — Progress Notes (Signed)
PCP: Dr Dagmar Hait  Primary HF Cardiologist: Dr Haroldine Laws   HPI: [name] is a 78 y.o. 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 Echo EF 15-20% with severe RV dysfunction. Had RHC/LHC with volume overlaod and high grade disttal LM lesion. CT surgery consulted. He was not thought to be surgery candidate given severe RV dysfunction, advanced age, fraility and CKD. Case reviewed with Dr. Burt Knack with regard to possibility of LM stenting. It is highly calcified lesion and with severely reduced EF would need probable Impella support but PAD likely precludes. Additionally, LV was down prior to CAD so may not improve with stenting -> medical management recommended. Improved markedly with diuresis. D/c weight 146   Readmitted to [facility] in 8/20 with poorly controlled DM2 (HgBa1c 15%). Treated with IV insulin and IVF.  Today he returns for HF follow up with his wife. Says he is feeling much better. Can do ADLs well as long as he goes slowly. Struggling with his balance. Has a fall about once a week. Says it is hard to keep his balance without his toes on one foot. Not using his walker or 4-point cane. Only using one-point cane. No CP, edema, orthopnea or PND No dizziness. No problems with his medications.     RHC/LHC 02/07/2019   Mid RCA to Dist RCA lesion is 50% stenosed.  Prox RCA lesion is 40% stenosed.  Mid LM to Dist LM lesion is 80% stenosed.  RPDA lesion is 90% stenosed.  Ost Cx to Prox Cx lesion is 80% stenosed.  Dist LM to Ost LAD lesion is 60% stenosed.  Prox Cx to Mid Cx lesion is 50% stenosed.  Prox LAD to Mid LAD lesion is 70% stenosed.  Findings: Ao = 136/76 (97) LV = 139/28 RA = 9 RV  = 65/10 PA = 67/29 (48) PCW = 36 Fick cardiac output/index = 4.1/2.2 PVR = 3.0 WU SVR = 1721 Assessment: 1. Severe 2v CAD with probable high grade, calcified distal left main lesion 2. EF 20-25% due to iCM 3. Elevated filling pressures with moderately reduced CO  Echo  EF 40-45% in 2015  EF 25-30% in 2016 EF 35-40% in 2017 EF 15-20% in 2020  Severer RV dysfunction.   ROS: All systems negative except as listed in HPI, PMH and Problem List.  SH:  Social History   Socioeconomic History  . Marital status: Married    Spouse name: Not on file  . Number of children: 3  . Years of education: Not on file  . Highest education level: Not on file  Occupational History  . Occupation: Retired  Scientific laboratory technician  . Financial resource strain: Not on file  . Food insecurity    Worry: Not on file    Inability: Not on file  . Transportation needs    Medical: No    Non-medical: No  Tobacco Use  . Smoking status: Former Smoker    Packs/day: 0.25    Years: 20.00    Pack years: 5.00    Types: Cigarettes  . Smokeless tobacco: Never Used  . Tobacco comment: Quit June 2017, patient states stil smoking some 1 or 2 cigarettes per day.  Substance and Sexual Activity  .  Alcohol use: Yes    Alcohol/week: 0.0 standard drinks    Comment: rarely  . Drug use: No  . Sexual activity: Not on file  Lifestyle  . Physical activity    Days per week: Not on file    Minutes per session: Not on file  . Stress: Not on file  Relationships  . Social Herbalist on phone: Not on file    Gets together: Not on file    Attends religious service: Not on file    Active member of club or organization: Not on file    Attends meetings of clubs or organizations: Not on file    Relationship status: Not on file  . Intimate partner violence    Fear of current or ex partner: Not on file    Emotionally abused: Not on file    Physically abused: Not on file    Forced sexual activity: Not on file  Other  Topics Concern  . Not on file  Social History Narrative  . Not on file    FH:  Family History  Problem Relation Age of Onset  . Diabetes Mellitus II Mother   . Heart disease Brother        before age 65  . Colon cancer Neg Hx   . Esophageal cancer Neg Hx   . Stomach cancer Neg Hx   . Anesthesia problems Neg Hx   . Hypotension Neg Hx   . Malignant hyperthermia Neg Hx   . Pseudochol deficiency Neg Hx     Past Medical History:  Diagnosis Date  . Allergy   . Atrial fibrillation ([place])   . Cataract   . CHF (congestive heart failure) ([place])   . Chronic kidney disease    Renal Insuffiecny  . Colon polyps 2012   Colonoscopy  . Diabetes mellitus    type 2  . Diverticulosis 2012   Colonoscopy   . Hyperglycemia 04/2019  . Hyperlipidemia   . Hypertension   . LBBB (left bundle branch block)   . Pneumonia 2015   3 times  . Stroke [facility]) 2015    Current Outpatient Medications  Medication Sig Dispense Refill  . aspirin EC 81 MG EC tablet Take 1 tablet (81 mg total) by mouth daily.    . carvedilol (COREG) 6.25 MG tablet Take 1 tablet (6.25 mg total) by mouth 2 (two) times daily. 60 tablet 6  . clopidogrel (PLAVIX) 75 MG tablet Take 75 mg by mouth daily.    . feeding supplement, GLUCERNA SHAKE, (GLUCERNA SHAKE) LIQD Take 237 mLs by mouth 3 (three) times daily between meals. 237 mL 30  . Insulin Detemir (LEVEMIR) 100 UNIT/ML Pen Inject 10 Units into the skin at bedtime. (Patient taking differently: Inject 22 Units into the skin at bedtime. ) 15 mL 0  . Insulin Pen Needle 32G X 4 MM MISC Use as directed daily at bedtime. 60 each 0  . iron polysaccharides (FERREX 150) 150 MG capsule Take 1 capsule (150 mg total) by mouth daily. 30 capsule 0  . isosorbide-hydrALAZINE (BIDIL) 20-37.5 MG tablet Take 0.5 tablets by mouth 3 (three) times daily. 45 tablet 6  . Multiple Vitamin (MULTIVITAMIN WITH MINERALS) TABS tablet Take 1 tablet by mouth daily.    . simvastatin (ZOCOR) 40 MG tablet Take 40  mg by mouth daily.     Marland Kitchen torsemide (DEMADEX) 20 MG tablet Take 1 tablet (20 mg total) by mouth every other day. 15 tablet 3  No current facility-administered medications for this encounter.       Vitals:   08/05/19 1113  BP: 130/86  Pulse: 68  SpO2: 100%  Weight: 74.3 kg (163 lb 12.8 oz)   Wt Readings from Last 3 Encounters:  08/05/19 74.3 kg (163 lb 12.8 oz)  06/03/19 67.9 kg (149 lb 12.8 oz)  05/22/19 60.1 kg (132 lb 7.9 oz)    PHYSICAL EXAM: General:  Elderly male. Walked into clinic  No resp difficulty HEENT: normal Neck: supple. no JVD. Carotids 2+ bilat; no bruits. No lymphadenopathy or thryomegaly appreciated. Cor: PMI nondisplaced. Regular rate & rhythm. No rubs, gallops or murmurs. Lungs: clear with decreased BS throughout Abdomen: soft, nontender, nondistended. No hepatosplenomegaly. No bruits or masses. Good bowel sounds. Extremities: no cyanosis, clubbing, rash, edema Neuro: alert & orientedx3, cranial nerves grossly intact. moves all 4 extremities w/o difficulty. Affect pleasant   ASSESSMENT & PLAN:  1. Chronic biventricular SEL:TRVUYEB with a history of non-ischemic cardiomyopathy with EF 25-30% in 2016, improved to 35-40% on echo in 2017.   - Echo  01/2019 revealed EF 15-20%, G3DD, and severe RV hypokinesis.  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 LBBB may also be playing a role - Echo today EF 20-25% RV ok  Personally reviewed - Improved NYHA II- early III Volume status ok  - Continue torsemide 20 daily - Continue carvedilol 6.25 mg twice a day. - Increase Bidil to 1 tab tid - Off spironolactone due to CKD (Last creatinine 2.2) - Have avoided SGLT2i with GFR < 30. However most recent data suggests may be safe down to GFR 20. Can reconsider.  - LBBB may also be playing a role (though it is atypical) - He is improved. Will refer to EP to discuss whether or not they feel he is candidate for ICD +/- CRT  2. CAD: - Cath 02/07/19 with likely high-grade distal LM lesion.   - Case has been reviewed with Dr. Prescott Gum who has reviewed images. LAD only graftable vessel and given severe RV dysfunction, advanced age, fraility and CKD not felt to be CABG candidate. Case reviewed with Dr. Burt Knack with regard to possibility of LM stenting. It is highly calcified lesion and with severely reduced EF would need probable Impella support but PAD likely precludes. Additionally, LV was down prior to CAD so may not improve with stenting. I suspect medical management may be best.  - No s/s ischemia. Continue medical management - On ASA/Plavix. No bleeding  - Continue statin    3.CKD3: - Creatinine baseline 1.9-2.1,  - He now follows with [name].  Most recent creatinine 2.2  - Recheck today  4. History of CVA: - He is on ASA 81, Plavix, and statin.   5. DM type 2, poorly controlled: - Per primary. Hgba1c 15 in 8/20 - Have avoided SGLT2i with GFR < 30. However most recent data suggests may be safe down to GFR 20. Can reconsider.   6. Falls - urged him to use 4-point cane or walker  Glori Bickers  MD 12:07 PM

## 2019-08-05 NOTE — Patient Instructions (Signed)
Increase Bidil to 1 tab Three times a day   Labs done today, we will notify you for abnormal results  Your physician recommends that you schedule a follow-up appointment in: 4 months  If you have any questions or concerns before your next appointment please send [place] a message through [place] or call our office at [phone].  At the [facility], you and your health needs are our priority. As part of our continuing mission to provide you with exceptional heart care, we have created designated Provider Care Teams. These Care Teams include your primary Cardiologist (physician) and Advanced Practice Providers (APPs- Physician Assistants and Nurse Practitioners) who all work together to provide you with the care you need, when you need it.   You may see any of the following providers on your designated Care Team at your next follow up: Marland Kitchen Dr Glori Bickers . Dr Loralie Champagne . Darrick Grinder, NP . Lyda Jester, PA   Please be sure to bring in all your medications bottles to every appointment.

## 2019-08-15 MED FILL — LEVEMIR FLEXTOUCH 100 UNITS: 100 | 81 days supply | Qty: 18 | Fill #0

## 2019-08-23 DIAGNOSIS — R69 Illness, unspecified: Secondary | ICD-10-CM | POA: Diagnosis not present

## 2019-08-26 DIAGNOSIS — N184 Chronic kidney disease, stage 4 (severe): Secondary | ICD-10-CM | POA: Diagnosis not present

## 2019-08-26 DIAGNOSIS — E1122 Type 2 diabetes mellitus with diabetic chronic kidney disease: Secondary | ICD-10-CM | POA: Diagnosis not present

## 2019-08-26 DIAGNOSIS — I130 Hypertensive heart and chronic kidney disease with heart failure and stage 1 through stage 4 chronic kidney disease, or unspecified chronic kidney disease: Secondary | ICD-10-CM | POA: Diagnosis not present

## 2019-08-26 DIAGNOSIS — Z794 Long term (current) use of insulin: Secondary | ICD-10-CM | POA: Diagnosis not present

## 2019-08-28 ENCOUNTER — Other Ambulatory Visit: Payer: Self-pay | Admitting: *Deleted

## 2019-08-28 NOTE — Patient Outreach (Signed)
[facility]) Care Management  08/28/2019  [name] 06-May-1941 [id number]   Subjective: Telephone call to patient's home / mobile number, spoke with patient, and HIPAA verified.  Patient states his memory was affected by the stroke, wife is currently in the store, and will ask her to call this RNCM back at a later time. Telephone call from patient's home / mobile number, spoke with patient's wife/ designated party release [name]), wife stated patient's name, date of birth, and address.   States she is returning call and remembers speaking with this RNCM in the past.  States patient is doing well, had a follow up visit with nurse practitioner on 08/27/2019, visit went well, doing a trial of 14 day monitoring system [facility]), started on 08/27/2019, and she will take the monitor back to MD's office on 09/08/2019.  States patient's blood sugars this week prior to new monitoring system was 113 -117.  Wife states patient is doing a lot better overall, has gained his weight back, continue to work on more healthy snacks and portion control.  States he also had follow up appointment with cardiologist (per chart on 08/05/2019), went well, and next follow up in March of 2021. Wife states patient does not have any education material, transition of care, care coordination,  transportation, community resource, or pharmacy needs at this time.  Wife  appreciative of follow up, and is agreement to next follow up after [employer] Libre trial completed. Wife states she will outreach to this [facility] if patient has needs prior to next scheduled outreach.     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 Patient  also has a history of chronic kidney disease stage 3, hypertension,Community acquired pneumonia of left lower lobe of lung, CAD,LBBB (left bundle branch block),Acute metabolic encephalopathy,Protein-calorie malnutrition, severe,and hyperlipidemia.     Assessment: Received [name] [facility] Liaison /MD referral on8/31/2020. Referral source: [name]. Referral reason: complex diabetes management. Screening follow up completed.[facility] Care Management Social Workerfollow upfor Advanced Directives and Smoking Cessation community resources,has been completed.RNCM willcontinue tofollow patient for diabetesshort termcase management.     Plan:RNCM will call patient's wife fortelephone outreach attempt, within14 business days,per wife's request, for short termdiabetes casemanagement.     [name] [name], BSN, [place] Management [facility] Telephonic CM Phone: [phone] Fax: [phone]

## 2019-09-03 ENCOUNTER — Other Ambulatory Visit (HOSPITAL_COMMUNITY): Payer: Self-pay | Admitting: Internal Medicine

## 2019-09-03 DIAGNOSIS — R69 Illness, unspecified: Secondary | ICD-10-CM | POA: Diagnosis not present

## 2019-09-04 ENCOUNTER — Institutional Professional Consult (permissible substitution): Payer: Medicare HMO | Admitting: Internal Medicine

## 2019-09-10 ENCOUNTER — Other Ambulatory Visit: Payer: Self-pay | Admitting: *Deleted

## 2019-09-10 NOTE — Patient Outreach (Signed)
[facility]) Care Management  09/10/2019  [name] 1941-02-02 [id number]    Late entry for 09/05/2019 @ 10:36am  Telephone call to Dr. Danna Hefty office ( patient's primary MD), left HIPAA compliant voicemail for Dr. Danna Hefty assistant, requested call back regarding patient recent office visit, recent blood glucose, and A1C. Late entry for 09/05/2019 @ 1:00pm  Telephone call from [place] at Dr. Danna Hefty office, states she received this RNCM's voicemail, patient had a visit with the Clinical Pharmacist (Dr. Etta Grandchild) on 08/26/2019, Libre Pro (Continuous Glucose Monitoring System )  was placed in the left arm, and will be removed in 14 days.   States patient's A1C was 11.0 on 08/26/2019, next appointment for labs only on 10/08/19, and next office visit will be on 10/15/2019 with Dr. Dagmar Hait.   [name] verified RNCM's fax number and states she will fax this RNCM notes from patient's last office visit.  Received notification on 09/08/2019 from [place] [facility] Management, fax received, and documents will be placed in patient's electronic medical record.   Reviewed faxed notes from 08/26/2019 office visit and will discuss with patient and / or patient's wife at next scheduled outreach.    Crist Kruszka [name], BSN, [place] Management [facility] Telephonic CM Phone: [phone] Fax: [phone]

## 2019-09-10 NOTE — Patient Outreach (Addendum)
[name]) Care Management  09/10/2019  Dejuan Elman 1940/10/21 [id number]   Subjective: Telephone call to patient's home / mobile number, spoke with patient's wife/ designated party release, stated patient's name, date of birth, and address.   Wife remembers speaking with this RNCM in the past.  RNCM advised wife of RNCM's follow up with patient's primary MD's office and received fax update, wife voices understanding.   Wife states patient is doing well, feeling good, eating well, and continue to work on portion control.  States patient has completed  [employer] trial, she disconnected meter per provider instructions, and returned to MD's office on 09/08/2019.  Wife states patient received a phone call from providers office on 09/09/2019, stating that the meter did not record data, and patient did not receive further direction from provider regarding next steps.   Discussed trial based on provider notes, goal of trial, wife voiced understanding, wife states patient is not willing to repeat the trial, has not received any additional feedback regarding trial for provider, and she is planning to follow up at next MD appointment on 10/15/2019. States patient's blood sugars are improving overall, still having lows and highs, did not take blood sugars consistently during [facility] trial, was told by provider not to take it.  States she has resume taking blood sugars before she goes to work and after she get home from work before patient has dinner.  States patient's blood sugar in am of 09/07/2019 was 58, he was symptomatic, she treated with orange juice, sugar, breakfast,  and blood sugar was 165 on retake after treatment.  States blood sugar was 260 in pm on 09/09/2019.  RNCM encouraged wife to write down all of patient's blood sugars to report to providers as needed, to journal patient's food related to blood sugar highs and lows, identify triggers, continue to work on portion  control, wife voices understanding, and is in agreement.    Wife states patient has next lab appointment at providers office on 10/07/2018.  Wife states patient received [place] and was very appreciative.   Wife in agreement to receive the following EMMI educational video on patient's behalf via wife's email ([email or web address] ): Diabetes - Checking Your Blood Sugar.   Wife states patient does not have any  transition of care,  transportation, community resource, or pharmacy needs at this time.  Wife  appreciative of follow up, and is agreement to next follow up prior to next lab appointment. Wife states she will outreach to this [facility] if patient has needs prior to next scheduled outreach.     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 Patient also has a history of chronic kidney disease stage 3, hypertension,Community acquired pneumonia of left lower lobe of lung, CAD,LBBB (left bundle branch block),Acute metabolic encephalopathy,Protein-calorie malnutrition, severe,and hyperlipidemia.     Assessment: Received [name] Liaison /MD referral on8/31/2020. Referral source: [name]. Referral reason: complex diabetes management. Screening follow up completed.[facility] Care Management Social Workerfollow upfor Advanced Directives and Smoking Cessation community resources,has been completed.RNCM willcontinue tofollow patient for diabetesshort termcase management.     Plan:RNCM will send patient's wife above EMMI educational video to review with patient, per wife's request.  RNCM will call patient's wife fortelephone outreach attempt,  within30business days,per wife's request, for  short termdiabetes casemanagement.     Davinci Glotfelty [name], BSN, [place] Management [facility] Telephonic CM Phone: [phone] Fax: [phone]

## 2019-09-16 DIAGNOSIS — R69 Illness, unspecified: Secondary | ICD-10-CM | POA: Diagnosis not present

## 2019-09-24 DIAGNOSIS — I5082 Biventricular heart failure: Secondary | ICD-10-CM | POA: Insufficient documentation

## 2019-09-24 NOTE — Progress Notes (Signed)
ELECTROPHYSIOLOGY CONSULT NOTE  Patient ID: [name], MRN: [id number], DOB/AGE: 76-Jul-1942 76 y.o. Admit date: (Not on file) Date of Consult: 09/28/2019  Primary Physician: Prince Solian, MD Primary Cardiologist:DB     [name] is a 78 y.o. male who is being seen today for the evaluation of ICD at the request of Dr DB.    HPI [name] is a 78 y.o. male referred for consideration of ICD and possible CRT  He is here today with his wife  Admitted 5/20 with A/C heart failure and RUL pneumonia.  Echo and cath As Below; cardiac surgery consult thought not to be candidate for comorbidities and RV failure; interventional consult-not a candidate 2/2 need for impella support precluded by PVD  Moderated DOE, some LH  No palpitations or syncope.  Balance and strength issues with recurrent falls despite care.  No CP  DATE TEST EF   2015 Echo  40-45%   5/20 Echo   15-20 % RV dysfunction severe  5/20 LHC 25 % LM-d80;  not revascularizable  LADp 70; Cxo-p 80;RCAm 50  11/20 Echo  15-20%     Date Cr K Hgb HgbA1c  8/20 3.14 5.5 14.7   11/20 2.4 4.6 12.8 15   ECG 9/20 Sinus w LBBB QRSd 158  ECG 8/20 Sinus with IVCD; monophasic R V6 but Qw L and rS lead 1 QRSd 150 ECG 7/20  Sinus with Atypical LBBB rS I,L and Rs V6  QRSd 146 ECG 5/20 Sinus w LBBB Monophasic R I,L and Rs V6 QRSd 148 ECG 02/04/19 Sinus with atypical LBBB QRSd 150 ms    Past Medical History:  Diagnosis Date  . Allergy   . Atrial fibrillation ([place])   . Cataract   . CHF (congestive heart failure) ([place])   . Chronic kidney disease    Renal Insuffiecny  . Colon polyps 2012   Colonoscopy  . Diabetes mellitus    type 2  . Diverticulosis 2012   Colonoscopy   . Hyperglycemia 04/2019  . Hyperlipidemia   . Hypertension   . LBBB (left bundle branch block)   . Pneumonia 2015   3 times  . Stroke [facility]) 2015      Surgical History:  Past Surgical History:  Procedure Laterality Date  . AMPUTATION Right  04/28/2016   Procedure: Right Transmetatarsal Amputation;  Surgeon: Newt Minion, MD;  Location: [place];  Service: Orthopedics;  Laterality: Right;  . CATARACT EXTRACTION Bilateral    both eyes  . COLON SURGERY     to repair intestines as child  . COLONOSCOPY  10/20/2011   Procedure: COLONOSCOPY;  Surgeon: Inda Castle, MD;  Location: [facility];  Service: Endoscopy;  Laterality: N/A;  . FEMORAL-POPLITEAL BYPASS GRAFT Right 04/07/2016   Procedure: BYPASS GRAFT FEMORAL-POPLITEAL ARTERY-RIGHT;  Surgeon: Angelia Mould, MD;  Location: [place];  Service: Vascular;  Laterality: Right;  . HOT HEMOSTASIS  10/20/2011   Procedure: HOT HEMOSTASIS (ARGON PLASMA COAGULATION/BICAP);  Surgeon: Inda Castle, MD;  Location: [facility];  Service: Endoscopy;  Laterality: N/A;  . KNEE ARTHROSCOPY Left    left  . NECK SURGERY     disectomy  . PERIPHERAL VASCULAR CATHETERIZATION Right 04/05/2016   Procedure: Lower Extremity Angiography;  Surgeon: Rosetta Posner, MD;  Location: [place] CV LAB;  Service: Cardiovascular;  Laterality: Right;  . PERIPHERAL VASCULAR CATHETERIZATION N/A 04/05/2016   Procedure: Abdominal Aortogram;  Surgeon: Rosetta Posner, MD;  Location: [place]  CV LAB;  Service: Cardiovascular;  Laterality: N/A;  . RIGHT/LEFT HEART CATH AND CORONARY ANGIOGRAPHY N/A 02/07/2019   Procedure: RIGHT/LEFT HEART CATH AND CORONARY ANGIOGRAPHY;  Surgeon: Jolaine Artist, MD;  Location: [place] CV LAB;  Service: Cardiovascular;  Laterality: N/A;  . VEIN HARVEST Right 04/07/2016   Procedure: VEIN HARVEST GREATER SAPHENOUS VIEN ;  Surgeon: Angelia Mould, MD;  Location[facility];  Service: Vascular;  Laterality: Right;     Home Meds: Current Meds  Medication Sig  . aspirin EC 81 MG EC tablet Take 1 tablet (81 mg total) by mouth daily.  . carvedilol (COREG) 6.25 MG tablet TAKE 1 TABLET BY MOUTH TWICE A DAY  . clopidogrel (PLAVIX) 75 MG tablet Take 75 mg by mouth daily.  . feeding  supplement, GLUCERNA SHAKE, (GLUCERNA SHAKE) LIQD Take 237 mLs by mouth 3 (three) times daily between meals.  . insulin detemir (LEVEMIR) 100 UNIT/ML injection Inject 22 Units into the skin daily.  . Insulin Pen Needle 32G X 4 MM MISC Use as directed daily at bedtime.  . iron polysaccharides (FERREX 150) 150 MG capsule Take 1 capsule (150 mg total) by mouth daily.  . isosorbide-hydrALAZINE (BIDIL) 20-37.5 MG tablet Take 1 tablet by mouth 3 (three) times daily.  . Multiple Vitamin (MULTIVITAMIN WITH MINERALS) TABS tablet Take 1 tablet by mouth daily.  . simvastatin (ZOCOR) 40 MG tablet Take 40 mg by mouth daily.   Marland Kitchen torsemide (DEMADEX) 20 MG tablet Take 1 tablet (20 mg total) by mouth every other day.    Allergies: No Known Allergies  Social History   Socioeconomic History  . Marital status: Married    Spouse name: Not on file  . Number of children: 3  . Years of education: Not on file  . Highest education level: Not on file  Occupational History  . Occupation: Retired  Tobacco Use  . Smoking status: Former Smoker    Packs/day: 0.25    Years: 20.00    Pack years: 5.00    Types: Cigarettes  . Smokeless tobacco: Never Used  . Tobacco comment: Quit June 2017, patient states stil smoking some 1 or 2 cigarettes per day.  Substance and Sexual Activity  . Alcohol use: Yes    Alcohol/week: 0.0 standard drinks    Comment: rarely  . Drug use: No  . Sexual activity: Not on file  Other Topics Concern  . Not on file  Social History Narrative  . Not on file   Social Determinants of Health   Financial Resource Strain:   . Difficulty of Paying Living Expenses: Not on file  Food Insecurity:   . Worried About Charity fundraiser in the Last Year: Not on file  . Ran Out of Food in the Last Year: Not on file  Transportation Needs: No Transportation Needs  . Lack of Transportation (Medical): No  . Lack of Transportation (Non-Medical): No  Physical Activity:   . Days of Exercise per  Week: Not on file  . Minutes of Exercise per Session: Not on file  Stress:   . Feeling of Stress : Not on file  Social Connections:   . Frequency of Communication with Friends and Family: Not on file  . Frequency of Social Gatherings with Friends and Family: Not on file  . Attends Religious Services: Not on file  . Active Member of Clubs or Organizations: Not on file  . Attends Archivist Meetings: Not on file  . Marital Status:  Not on file  Intimate Partner Violence:   . Fear of Current or Ex-Partner: Not on file  . Emotionally Abused: Not on file  . Physically Abused: Not on file  . Sexually Abused: Not on file     Family History  Problem Relation Age of Onset  . Diabetes Mellitus II Mother   . Heart disease Brother        before age 33  . Colon cancer Neg Hx   . Esophageal cancer Neg Hx   . Stomach cancer Neg Hx   . Anesthesia problems Neg Hx   . Hypotension Neg Hx   . Malignant hyperthermia Neg Hx   . Pseudochol deficiency Neg Hx      ROS:  Please see the history of present illness.   All other systems reviewed and negative.    Physical Exam:  Blood pressure 126/70, pulse 76, height 5\' 10"  (1.778 m), weight 174 lb 9.6 oz (79.2 kg), SpO2 99 %. General: Well developed, well nourished male in no acute distress. Head: Normocephalic, atraumatic, sclera non-icteric, no xanthomas, nares are without discharge. EENT: normal  Lymph Nodes:  none Neck: Negative for carotid bruits. JVD 6-8. Back:without scoliosis kyphosis  Lungs: Clear bilaterally to auscultation without wheezes, rales, or rhonchi. Breathing is unlabored. Heart: RRR with S1 S2. No  murmur . No rubs, or gallops appreciated. Abdomen: Soft, non-tender, non-distended with normoactive bowel sounds. No hepatomegaly. No rebound/guarding. No obvious abdominal masses. Msk:  Strength and tone appear normal for age. Extremities: No clubbing or cyanosis. No*edema.  Distal pedal pulses are 2+ and equal  bilaterally. Skin: Warm and Dry Neuro: Alert and oriented X 3. CN III-XII intact Grossly normal sensory and motor function .gait instability Psych:  Responds to questions appropriately with a normal affect.      Labs: Cardiac Enzymes No results for input(s): CKTOTAL, CKMB, TROPONINI in the last 72 hours. CBC Lab Results  Component Value Date   WBC 6.7 05/23/2019   HGB 12.8 (L) 05/23/2019   HCT 37.3 (L) 05/23/2019   MCV 90.8 05/23/2019   PLT 187 05/23/2019   PROTIME: No results for input(s): LABPROT, INR in the last 72 hours. Chemistry No results for input(s): NA, K, CL, CO2, BUN, CREATININE, CALCIUM, PROT, BILITOT, ALKPHOS, ALT, AST, GLUCOSE in the last 168 hours.  Invalid input(s): LABALBU Lipids Lab Results  Component Value Date   CHOL 97 02/28/2016   HDL 28 (L) 02/28/2016   LDLCALC 51 02/28/2016   TRIG 92 02/28/2016   BNP No results found for: PROBNP Thyroid Function Tests: No results for input(s): TSH, T4TOTAL, T3FREE, THYROIDAB in the last 72 hours.  Invalid input(s): FREET3 Miscellaneous No results found for: DDIMER  Radiology/Studies:  No results found.  EKG: Sinus at 76 Intervals 21/14/41. Atypical left bundle branch block with a monophasic R wave in lead I, a fractionated RsR prime in lead aVL and r S in lead V6   Assessment and Plan:  Ischemic cardiomyopathy  Biventricular failure-chronic-systolic class III  Atypical left bundle branch block  Renal insufficiency grade 4  Falls-recurrent  CVA   The patient has chronic systolic heart failure in the setting of ischemic heart disease and is on revascularizable in the context of worsening renal insufficiency.  Comorbidities and the inability to revascularize diminish the value of an ICD for primary prevention.  This is further enhanced by his age.  I think an ICD is unlikely to provide sufficient benefit to justify implantation.  The second question is  whether he would benefit from CRT.  QRS  durations have all been in the 135 155 range.  He has left bundle branch block to the degree that it is evident is always atypical.  Mostly his ECGs look like a nonspecific IVCD.  This would suggest that the likelihood of benefit with resynchronization is also significantly diminished.  Hence, again given his comorbidities now particularly his renal insufficiency and severe right heart failure I think the likelihood of benefit from CRT is also insufficiently great to justify its implantation.     We will be available as needed.  I also stressed with him the importance of balance and strength in his legs to prevent falls.  Have suggested that he follow-up with his PCP regarding further physical therapy and the potential benefits to him on something like a CUBII   Virl Axe

## 2019-09-25 ENCOUNTER — Encounter: Payer: Self-pay | Admitting: Internal Medicine

## 2019-09-25 ENCOUNTER — Other Ambulatory Visit: Payer: Self-pay

## 2019-09-25 ENCOUNTER — Ambulatory Visit: Payer: Medicare HMO | Admitting: Internal Medicine

## 2019-09-25 VITALS — BP 126/70 | HR 76 | Ht 70.0 in | Wt 174.6 lb

## 2019-09-25 DIAGNOSIS — I447 Left bundle-branch block, unspecified: Secondary | ICD-10-CM

## 2019-09-25 DIAGNOSIS — I5082 Biventricular heart failure: Secondary | ICD-10-CM

## 2019-09-25 NOTE — Patient Instructions (Signed)
Medication Instructions:  Your physician recommends that you continue on your current medications as directed. Please refer to the Current Medication list given to you today.  *If you need a refill on your cardiac medications before your next appointment, please call your pharmacy*  Lab Work: None ordered.  If you have labs (blood work) drawn today and your tests are completely normal, you will receive your results only by: Marland Kitchen MyChart Message (if you have MyChart) OR . A paper copy in the mail If you have any lab test that is abnormal or we need to change your treatment, we will call you to review the results.  Testing/Procedures: None ordered.   Follow-Up: At [facility], you and your health needs are our priority.  As part of our continuing mission to provide you with exceptional heart care, we have created designated Provider Care Teams.  These Care Teams include your primary Cardiologist (physician) and Advanced Practice Providers (APPs -  Physician Assistants and Nurse Practitioners) who all work together to provide you with the care you need, when you need it.  Your next appointment:  You will follow up with Dr Caryl Comes as needed.

## 2019-10-01 MED FILL — FERREX 150 CAPSULE: 150 | 90 days supply | Qty: 90 | Fill #0

## 2019-10-02 ENCOUNTER — Other Ambulatory Visit: Payer: Self-pay | Admitting: *Deleted

## 2019-10-02 NOTE — Patient Outreach (Addendum)
[facility]) Care Management  10/02/2019  [name] May 06, 1941 [id number]   Subjective: Telephone call to patient's home / mobile number, spoke with patient's wife/ designated party release, stated patient's name, date of birth, and address. Wife remembers speaking with this RNCM in the past.  Wife states patient is doing pretty good,  she just walked in the door from work, and in agreement to follow up at this time on patient's behalf.   States patient had an appointment with cardiologist (Dr. Caryl Comes ) on 09/25/2019, regarding ICD (implantable cardioverted defibrillator) evaluation, ICD not recommended due to unlikely to provide sufficient benefit to justify implantation, per MD note, and was encouraged to exercise for strengthening.  States patient has an appointment with nephrologist  ([name]) on 10/03/2019, has labs on 10/08/2019, and appointment with primary MD (Dr. Dagmar Hait ) on 10/15/2019.  States she continues to take blood sugars in morning before she goes to work and in the evening prior to insulin, is a recording them, and call to MD's office as needed.  Wife states patient has had 4 episodes of low blood sugar that she has treated with sugar, a meal, orange juice, since 09/11/2019, low blood sugar range 57 - 62, and high blood sugar range has greatly improved,  range is 171 - 262 over the same time frame.  Discussed to the importance of identifying triggers (diet or activities) for blood sugar highs and lows,  how these things may be affecting patient's blood sugars, wife voices understanding, and states she will encourage patient to tell her what he is eating when she is at work.  States patient is very independent and does not want her to know sometimes what  he is eating while she is at home.  Wife states patient can always tell when his blood sugar is low because he feels bad.   RNCM educated wife on positive reinforcement strategies to encourage patient to share what he is  eating so they can identify his triggers to feeling bad, will encourage behaviors that make him feel good, wife voices understanding, and states she will try these strategies. Wife states she has not looked at her email lately, she verified her e-mail address,  States she will look for the [facility] educational videos that was sent by this RNCM, and let RNCM know if videos need to be re-sent.   Falls and Nutritional assessment completed, no recent patient falls per wife, and states his appetite is very good.  Wifestates patientdoes not have any  transition of care, transportation, [employer], educational, or pharmacy needs at this time. Wife appreciative of follow up, and is agreement to next follow up after MD appointments. Wife states she will outreach to this [facility] if patient has needs prior to next scheduled outreach.  Wife also in agreement to receive [place] Management calendar, continue to record blood sugars, and receive [place] Management services on patient's behalf.      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 Patient also has a history of chronic kidney disease stage 3, hypertension,Community acquired pneumonia of left lower lobe of lung, CAD,LBBB (left bundle branch block),Acute metabolic encephalopathy,Protein-calorie malnutrition, severe,and hyperlipidemia.     Assessment: Received [name] [facility] Liaison /MD referral on8/31/2020. Referral source: [name]. Referral reason: complex diabetes management.  Screening follow up completed.[facility] Care Management Social Workerfollow upfor Advanced Directives and Smoking Cessation community resources,has  been completed.RNCM willcontinue tofollow patient for diabetesshort termcase management.     Plan: RNCM will send resource letter with [place] Management Calendar as enclosure to patient per wife's request. RNCM will call patient's wife fortelephone outreach attempt, within30business days,per wife's request, for short termdiabetes casemanagement, and assess for further CM needs.      Lael Pilch [name], BSN, [place] Management [facility] Telephonic CM Phone: [phone] Fax: [phone]

## 2019-10-03 DIAGNOSIS — N183 Chronic kidney disease, stage 3 unspecified: Secondary | ICD-10-CM | POA: Diagnosis not present

## 2019-10-03 DIAGNOSIS — E875 Hyperkalemia: Secondary | ICD-10-CM | POA: Diagnosis not present

## 2019-10-03 DIAGNOSIS — D631 Anemia in chronic kidney disease: Secondary | ICD-10-CM | POA: Diagnosis not present

## 2019-10-03 DIAGNOSIS — E785 Hyperlipidemia, unspecified: Secondary | ICD-10-CM | POA: Diagnosis not present

## 2019-10-03 DIAGNOSIS — H3500 Unspecified background retinopathy: Secondary | ICD-10-CM | POA: Diagnosis not present

## 2019-10-03 DIAGNOSIS — E1122 Type 2 diabetes mellitus with diabetic chronic kidney disease: Secondary | ICD-10-CM | POA: Diagnosis not present

## 2019-10-03 DIAGNOSIS — N189 Chronic kidney disease, unspecified: Secondary | ICD-10-CM | POA: Diagnosis not present

## 2019-10-03 DIAGNOSIS — J449 Chronic obstructive pulmonary disease, unspecified: Secondary | ICD-10-CM | POA: Diagnosis not present

## 2019-10-03 DIAGNOSIS — I509 Heart failure, unspecified: Secondary | ICD-10-CM | POA: Diagnosis not present

## 2019-10-03 DIAGNOSIS — N2581 Secondary hyperparathyroidism of renal origin: Secondary | ICD-10-CM | POA: Diagnosis not present

## 2019-10-03 DIAGNOSIS — I2510 Atherosclerotic heart disease of native coronary artery without angina pectoris: Secondary | ICD-10-CM | POA: Diagnosis not present

## 2019-10-09 DIAGNOSIS — E46 Unspecified protein-calorie malnutrition: Secondary | ICD-10-CM | POA: Diagnosis not present

## 2019-10-09 DIAGNOSIS — E1122 Type 2 diabetes mellitus with diabetic chronic kidney disease: Secondary | ICD-10-CM | POA: Diagnosis not present

## 2019-10-09 DIAGNOSIS — N184 Chronic kidney disease, stage 4 (severe): Secondary | ICD-10-CM | POA: Diagnosis not present

## 2019-10-09 DIAGNOSIS — D509 Iron deficiency anemia, unspecified: Secondary | ICD-10-CM | POA: Diagnosis not present

## 2019-10-09 DIAGNOSIS — Z125 Encounter for screening for malignant neoplasm of prostate: Secondary | ICD-10-CM | POA: Diagnosis not present

## 2019-10-09 DIAGNOSIS — E7849 Other hyperlipidemia: Secondary | ICD-10-CM | POA: Diagnosis not present

## 2019-10-15 DIAGNOSIS — I25118 Atherosclerotic heart disease of native coronary artery with other forms of angina pectoris: Secondary | ICD-10-CM | POA: Diagnosis not present

## 2019-10-15 DIAGNOSIS — R82998 Other abnormal findings in urine: Secondary | ICD-10-CM | POA: Diagnosis not present

## 2019-10-15 DIAGNOSIS — I130 Hypertensive heart and chronic kidney disease with heart failure and stage 1 through stage 4 chronic kidney disease, or unspecified chronic kidney disease: Secondary | ICD-10-CM | POA: Diagnosis not present

## 2019-10-15 DIAGNOSIS — Z0000 Encounter for general adult medical examination without abnormal findings: Secondary | ICD-10-CM | POA: Diagnosis not present

## 2019-10-15 DIAGNOSIS — E1122 Type 2 diabetes mellitus with diabetic chronic kidney disease: Secondary | ICD-10-CM | POA: Diagnosis not present

## 2019-10-15 DIAGNOSIS — D509 Iron deficiency anemia, unspecified: Secondary | ICD-10-CM | POA: Diagnosis not present

## 2019-10-15 DIAGNOSIS — N184 Chronic kidney disease, stage 4 (severe): Secondary | ICD-10-CM | POA: Diagnosis not present

## 2019-10-15 DIAGNOSIS — I5021 Acute systolic (congestive) heart failure: Secondary | ICD-10-CM | POA: Diagnosis not present

## 2019-10-15 DIAGNOSIS — E785 Hyperlipidemia, unspecified: Secondary | ICD-10-CM | POA: Diagnosis not present

## 2019-10-15 DIAGNOSIS — Z66 Do not resuscitate: Secondary | ICD-10-CM | POA: Diagnosis not present

## 2019-10-15 DIAGNOSIS — I69959 Hemiplegia and hemiparesis following unspecified cerebrovascular disease affecting unspecified side: Secondary | ICD-10-CM | POA: Diagnosis not present

## 2019-10-22 DIAGNOSIS — Z1212 Encounter for screening for malignant neoplasm of rectum: Secondary | ICD-10-CM | POA: Diagnosis not present

## 2019-10-23 ENCOUNTER — Other Ambulatory Visit: Payer: Self-pay | Admitting: *Deleted

## 2019-10-23 NOTE — Patient Outreach (Addendum)
[facility]) Care Management  10/23/2019  [name] 1940-12-21 [id number]   Subjective:  Per patient's previous verbal request, requested that RNCM speak with wife for updates. Telephone call to patient's home / mobile number, spoke with patient, states wife/ designated party release [name]), is not currently at home, and left  HIPAA compliant message, and requested call back.   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 Patient also has a history of chronic kidney disease stage 3, hypertension,Community acquired pneumonia of left lower lobe of lung, CAD,LBBB (left bundle branch block),Acute metabolic encephalopathy,Protein-calorie malnutrition, severe,and hyperlipidemia.     Assessment: Received [name] [facility] Liaison /MD referral on8/31/2020. Referral source: [name]. Referral reason: complex diabetes management. Screening follow up completed.[facility] Care Management Social Workerfollow upfor Advanced Directives and Smoking Cessation community resources,has been completed.RNCM willcontinue tofollow patient for diabetesshort termcase management.     Plan: RNCM has sent resource letter with [place] Management Calendar as enclosure to patient per wife's request. RNCM will call patient's wife fortelephone outreach attempt, within10business days,per wife's request, for short termdiabetes casemanagement, and assess for further CM needs.      [name] [name], BSN, [place] Management [facility] Telephonic CM Phone: [phone] Fax: [phone]

## 2019-10-28 ENCOUNTER — Ambulatory Visit: Payer: Self-pay | Admitting: *Deleted

## 2019-10-29 ENCOUNTER — Ambulatory Visit: Payer: Self-pay | Admitting: *Deleted

## 2019-10-30 ENCOUNTER — Ambulatory Visit: Payer: Self-pay | Admitting: *Deleted

## 2019-11-03 ENCOUNTER — Ambulatory Visit: Payer: Self-pay | Admitting: *Deleted

## 2019-11-05 ENCOUNTER — Other Ambulatory Visit: Payer: Self-pay | Admitting: *Deleted

## 2019-11-05 NOTE — Patient Outreach (Addendum)
[name]) Care Management  11/05/2019  [name] 10-01-1940 [id number]   Subjective: Per patient's previous verbal request, requested that RNCM speak with wife for updates. Telephone call to patient's home / mobile number, spoke with patient's wife/ designated party release [name]), stated patient's name, date of birth, and address. Wife remembers speaking with this RNCM in the past.  States patient is doing pretty good, appetite has improved, likes peaches, and crackers for snacks.  States patient is aware of things that he should not eat and how it affects his blood sugars.  Wife states patient has no complaints at this time.   States last appointment with primary MD went well, insulin was adjusted, and has a follow up appointment with nurse practitioner on 12/02/2019.  States blood sugars are improving and range over the last few weeks, was 97- 194. States she does not recall an A1C being done at the most recent appointment and will ask about it at next appointment. Wife states she did received [name] Calendar and EMMI Educational video, both were very helpful, appreciative of the information.  Wife states someone has been calling weekly from [place], regarding a program, requesting to do a home visit, and states patient is refusing.  Wifestates patientdoes not have any transition of care, transportation, [employer], educational, or pharmacy needs at this time. Wife appreciative of follow up, and is agreement to next follow upwithin the next 3 months. Wife states she will outreach to this [facility] if patient has needs prior to next scheduled outreach.  Wife also in agreement to receive [place] Management information as needed, continue to record blood sugars, and receive [place] Management services on patient's behalf.      Objective:Per KPN (Knowledge Performance Now, point of care tool) and chart review,patient hospitalized 05/21/2019 - 05/23/2019  forUncontrolled diabetes mellitus with hyperglycemia, 04/01/2019 - 04/04/2019 for acute respiratory failure, 02/04/2019 -02/10/2019 for Acute on chroniccombined systolic/diastoliccongestive heart failure. Patient also has a history of chronic kidney disease stage 3, hypertension,Community acquired pneumonia of left lower lobe of lung, CAD,LBBB (left bundle branch block),Acute metabolic encephalopathy,Protein-calorie malnutrition, severe,and hyperlipidemia.     Assessment: Received [name] [name] Liaison /MD referral on8/31/2020. Referral source: [name]. Referral reason: complex diabetes management. Screening follow up completed.[name] Care Management Social Workerfollow upfor Advanced Directives and Smoking Cessation community resources,has been completed.RNCM willcontinue tofollow patient for diabeteschroniccase management.     Plan: RNCM will call patient's wife fortelephone outreach attempt, withinnext 3 months,per wife's request, for chronic diabetes casemanagement, and assess for further CM needs.      Or[place] Hilligoss [name], BSN, [place] Management [facility] Telephonic CM Phone: [phone] Fax: [phone]

## 2019-12-02 ENCOUNTER — Telehealth (HOSPITAL_COMMUNITY): Payer: Self-pay

## 2019-12-02 NOTE — Telephone Encounter (Signed)

## 2019-12-03 ENCOUNTER — Encounter (HOSPITAL_COMMUNITY): Payer: Self-pay | Admitting: Internal Medicine

## 2019-12-03 ENCOUNTER — Ambulatory Visit (HOSPITAL_COMMUNITY)
Admission: RE | Admit: 2019-12-03 | Discharge: 2019-12-03 | Disposition: A | Discharge status: Home / Self Care | Payer: Medicare HMO | Source: Ambulatory Visit | Attending: Internal Medicine | Admitting: Internal Medicine

## 2019-12-03 ENCOUNTER — Other Ambulatory Visit: Payer: Self-pay

## 2019-12-03 VITALS — BP 92/44 | HR 77 | Wt 173.2 lb

## 2019-12-03 DIAGNOSIS — E785 Hyperlipidemia, unspecified: Secondary | ICD-10-CM | POA: Diagnosis not present

## 2019-12-03 DIAGNOSIS — Z833 Family history of diabetes mellitus: Secondary | ICD-10-CM | POA: Insufficient documentation

## 2019-12-03 DIAGNOSIS — I5022 Chronic systolic (congestive) heart failure: Secondary | ICD-10-CM | POA: Diagnosis not present

## 2019-12-03 DIAGNOSIS — I4891 Unspecified atrial fibrillation: Secondary | ICD-10-CM | POA: Insufficient documentation

## 2019-12-03 DIAGNOSIS — N1832 Chronic kidney disease, stage 3b: Secondary | ICD-10-CM | POA: Diagnosis not present

## 2019-12-03 DIAGNOSIS — Z87891 Personal history of nicotine dependence: Secondary | ICD-10-CM | POA: Insufficient documentation

## 2019-12-03 DIAGNOSIS — E1122 Type 2 diabetes mellitus with diabetic chronic kidney disease: Secondary | ICD-10-CM | POA: Diagnosis not present

## 2019-12-03 DIAGNOSIS — N183 Chronic kidney disease, stage 3 unspecified: Secondary | ICD-10-CM | POA: Insufficient documentation

## 2019-12-03 DIAGNOSIS — Z794 Long term (current) use of insulin: Secondary | ICD-10-CM | POA: Diagnosis not present

## 2019-12-03 DIAGNOSIS — Z7902 Long term (current) use of antithrombotics/antiplatelets: Secondary | ICD-10-CM | POA: Insufficient documentation

## 2019-12-03 DIAGNOSIS — I130 Hypertensive heart and chronic kidney disease with heart failure and stage 1 through stage 4 chronic kidney disease, or unspecified chronic kidney disease: Secondary | ICD-10-CM | POA: Insufficient documentation

## 2019-12-03 DIAGNOSIS — Z79899 Other long term (current) drug therapy: Secondary | ICD-10-CM | POA: Insufficient documentation

## 2019-12-03 DIAGNOSIS — I2510 Atherosclerotic heart disease of native coronary artery without angina pectoris: Secondary | ICD-10-CM

## 2019-12-03 DIAGNOSIS — N184 Chronic kidney disease, stage 4 (severe): Secondary | ICD-10-CM | POA: Diagnosis not present

## 2019-12-03 DIAGNOSIS — I447 Left bundle-branch block, unspecified: Secondary | ICD-10-CM

## 2019-12-03 DIAGNOSIS — I428 Other cardiomyopathies: Secondary | ICD-10-CM | POA: Diagnosis not present

## 2019-12-03 DIAGNOSIS — Z8249 Family history of ischemic heart disease and other diseases of the circulatory system: Secondary | ICD-10-CM | POA: Diagnosis not present

## 2019-12-03 DIAGNOSIS — I5082 Biventricular heart failure: Secondary | ICD-10-CM | POA: Diagnosis not present

## 2019-12-03 DIAGNOSIS — Z7982 Long term (current) use of aspirin: Secondary | ICD-10-CM | POA: Diagnosis not present

## 2019-12-03 DIAGNOSIS — Z8673 Personal history of transient ischemic attack (TIA), and cerebral infarction without residual deficits: Secondary | ICD-10-CM | POA: Diagnosis not present

## 2019-12-03 LAB — BASIC METABOLIC PANEL
Anion gap: 9 (ref 5–15)
BUN: 45 mg/dL — ABNORMAL HIGH (ref 8–23)
CO2: 25 mmol/L (ref 22–32)
Calcium: 9.1 mg/dL (ref 8.9–10.3)
Chloride: 105 mmol/L (ref 98–111)
Creatinine, Ser: 2.92 mg/dL — ABNORMAL HIGH (ref 0.61–1.24)
GFR calc Af Amer: 23 mL/min — ABNORMAL LOW (ref >60)
GFR calc non Af Amer: 20 mL/min — ABNORMAL LOW (ref >60)
Glucose, Bld: 142 mg/dL — ABNORMAL HIGH (ref 70–99)
Potassium: 5 mmol/L (ref 3.5–5.1)
Sodium: 139 mmol/L (ref 135–145)

## 2019-12-03 LAB — CBC
HCT: 43.3 % (ref 39.0–52.0)
Hemoglobin: 13.4 g/dL (ref 13.0–17.0)
MCH: 30.5 pg (ref 26.0–34.0)
MCHC: 30.9 g/dL (ref 30.0–36.0)
MCV: 98.6 fL (ref 80.0–100.0)
Platelets: 169 K/uL (ref 150–400)
RBC: 4.39 MIL/uL (ref 4.22–5.81)
RDW: 13.6 % (ref 11.5–15.5)
WBC: 6.1 K/uL (ref 4.0–10.5)
nRBC: 0 % (ref 0.0–0.2)

## 2019-12-03 LAB — BRAIN NATRIURETIC PEPTIDE: B Natriuretic Peptide: 50.4 pg/mL (ref 0.0–100.0)

## 2019-12-03 NOTE — Patient Instructions (Signed)
Your physician has requested that you regularly monitor and record your blood pressure readings at home. Please use the same machine at the same time of day to check your readings and record them.  IF the top number is consistently 80 or below please call our office   Labs done today, your results will be available in MyChart, we will contact you for abnormal readings.  If you have any questions or concerns before your next appointment please send [place] a message through [place] or call our office at [phone].  At the [facility], you and your health needs are our priority. As part of our continuing mission to provide you with exceptional heart care, we have created designated Provider Care Teams. These Care Teams include your primary Cardiologist (physician) and Advanced Practice Providers (APPs- Physician Assistants and Nurse Practitioners) who all work together to provide you with the care you need, when you need it.   You may see any of the following providers on your designated Care Team at your next follow up: Marland Kitchen Dr Glori Bickers . Dr Loralie Champagne . Darrick Grinder, NP . Lyda Jester, PA . Audry Riles, PharmD   Please be sure to bring in all your medications bottles to every appointment.

## 2019-12-03 NOTE — Progress Notes (Signed)
ADVANCED HF CLINIC NOTE  PCP: Dr Dagmar Hait  Primary HF Cardiologist: Dr Haroldine Laws   HPI: [name] is a 79 y.o. 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 Echo EF 15-20% with severe RV dysfunction. Had RHC/LHC with volume overlaod and high grade disttal LM lesion. CT surgery consulted. He was not thought to be surgery candidate given severe RV dysfunction, advanced age, fraility and CKD. Case reviewed with Dr. Burt Knack with regard to possibility of LM stenting. It is highly calcified lesion and with severely reduced EF would need probable Impella support but PAD likely precludes. Additionally, LV was down prior to CAD so may not improve with stenting -> medical management recommended. Improved markedly with diuresis. D/c weight 146   Readmitted to [facility] in 8/20 with poorly controlled DM2 (HgBa1c 15%). Treated with IV insulin and IVF.  Today he returns for HF follow up with his wife. Doing pretty well. Says he stumbled and fell this morning in the shower. Hit his knee and hip but not his head. Using his cane. No CP or SOB. No edema. Says falls are not from dizziness dut from imbalance because he doesn't have toes on his R foot.   He saw Dr. Caryl Comes in 12/20 and felt not to be candidate for ICD and felt he would not benefit from CRT either.   RHC/LHC 02/07/2019   Mid RCA to Dist RCA lesion is 50% stenosed.  Prox RCA lesion is 40% stenosed.  Mid LM to Dist LM lesion is 80% stenosed.  RPDA lesion is 90% stenosed.  Ost Cx to Prox Cx lesion is 80% stenosed.  Dist LM to Ost LAD lesion is 60% stenosed.  Prox Cx to Mid Cx lesion is 50% stenosed.  Prox LAD to Mid LAD lesion is 70% stenosed.   Findings: Ao = 136/76 (97) LV = 139/28 RA = 9 RV = 65/10 PA = 67/29 (48) PCW = 36 Fick cardiac output/index = 4.1/2.2 PVR = 3.0 WU SVR = 1721 Assessment: 1. Severe 2v CAD with probable high grade, calcified distal left main lesion 2. EF 20-25% due to iCM 3. Elevated filling pressures with moderately reduced CO  Echo  EF 40-45% in 2015  EF 25-30% in 2016 EF 35-40% in 2017 EF 15-20% in 2020  Severe RV dysfunction.   ROS: All systems negative except as listed in HPI, PMH and Problem List.  SH:  Social History   Socioeconomic History  . Marital status: Married    Spouse name: Not on file  . Number of children: 3  . Years of education: Not on file  . Highest education level: Not on file  Occupational History  . Occupation: Retired  Tobacco Use  . Smoking status: Former Smoker    Packs/day: 0.25    Years: 20.00    Pack years: 5.00    Types: Cigarettes  . Smokeless tobacco: Never Used  . Tobacco comment: Quit June 2017, patient states stil smoking some 1 or 2 cigarettes per day.  Substance and Sexual Activity  . Alcohol use: Yes    Alcohol/week: 0.0 standard drinks    Comment: rarely  . Drug use: No  . Sexual activity: Not on file  Other  Topics Concern  . Not on file  Social History Narrative  . Not on file   Social Determinants of Health   Financial Resource Strain:   . Difficulty of Paying Living Expenses: Not on file  Food Insecurity:   . Worried About Charity fundraiser in the Last Year: Not on file  . Ran Out of Food in the Last Year: Not on file  Transportation Needs: No Transportation Needs  . Lack of Transportation (Medical): No  . Lack of Transportation (Non-Medical): No  Physical Activity:   . Days of Exercise per Week: Not on file  . Minutes of Exercise per Session: Not on file  Stress:   . Feeling of Stress : Not on file  Social Connections:   . Frequency of Communication with Friends and Family: Not on file  . Frequency of Social  Gatherings with Friends and Family: Not on file  . Attends Religious Services: Not on file  . Active Member of Clubs or Organizations: Not on file  . Attends Archivist Meetings: Not on file  . Marital Status: Not on file  Intimate Partner Violence:   . Fear of Current or Ex-Partner: Not on file  . Emotionally Abused: Not on file  . Physically Abused: Not on file  . Sexually Abused: Not on file    FH:  Family History  Problem Relation Age of Onset  . Diabetes Mellitus II Mother   . Heart disease Brother        before age 1  . Colon cancer Neg Hx   . Esophageal cancer Neg Hx   . Stomach cancer Neg Hx   . Anesthesia problems Neg Hx   . Hypotension Neg Hx   . Malignant hyperthermia Neg Hx   . Pseudochol deficiency Neg Hx     Past Medical History:  Diagnosis Date  . Allergy   . Atrial fibrillation ([place])   . Cataract   . CHF (congestive heart failure) ([place])   . Chronic kidney disease    Renal Insuffiecny  . Colon polyps 2012   Colonoscopy  . Diabetes mellitus    type 2  . Diverticulosis 2012   Colonoscopy   . Hyperglycemia 04/2019  . Hyperlipidemia   . Hypertension   . LBBB (left bundle branch block)   . Pneumonia 2015   3 times  . Stroke [facility]) 2015    Current Outpatient Medications  Medication Sig Dispense Refill  . aspirin EC 81 MG EC tablet Take 1 tablet (81 mg total) by mouth daily.    . carvedilol (COREG) 6.25 MG tablet TAKE 1 TABLET BY MOUTH TWICE A DAY 180 tablet 2  . clopidogrel (PLAVIX) 75 MG tablet Take 75 mg by mouth daily.    . Insulin Pen Needle 32G X 4 MM MISC Use as directed daily at bedtime. 60 each 0  . iron polysaccharides (FERREX 150) 150 MG capsule Take 1 capsule (150 mg total) by mouth daily. 30 capsule 0  . isosorbide-hydrALAZINE (BIDIL) 20-37.5 MG tablet Take 1 tablet by mouth 3 (three) times daily. 90 tablet 6  . Multiple Vitamin (MULTIVITAMIN WITH MINERALS) TABS tablet Take 1 tablet by mouth daily.    Marland Kitchen NOVOLIN 70/30 FLEXPEN  (70-30) 100 UNIT/ML KwikPen INJECT 6 UNITS BEFORE BREAKFAST AND 12 UNITS BEFORE DINNER EVERY DAY    . simvastatin (ZOCOR) 40 MG tablet Take 40 mg by mouth daily.     Marland Kitchen torsemide (DEMADEX) 20 MG tablet Take 1 tablet (20  mg total) by mouth every other day. 15 tablet 6   No current facility-administered medications for this encounter.      Vitals:   12/03/19 1042  BP: (!) 92/44  Pulse: 77  SpO2: 98%  Weight: 78.6 kg (173 lb 3.2 oz)   Wt Readings from Last 3 Encounters:  12/03/19 78.6 kg (173 lb 3.2 oz)  09/25/19 79.2 kg (174 lb 9.6 oz)  08/05/19 74.3 kg (163 lb 12.8 oz)    PHYSICAL EXAM: General:  Elderly thin male. No resp difficulty HEENT: normal Neck: supple. no JVD. Carotids 2+ bilat; no bruits. No lymphadenopathy or thryomegaly appreciated. Cor: PMI nondisplaced. Regular rate & rhythm. No rubs, gallops or murmurs. Lungs: clear Abdomen: soft, nontender, nondistended. No hepatosplenomegaly. No bruits or masses. Good bowel sounds. Extremities: no cyanosis, clubbing, rash, edema Neuro: alert & orientedx3, cranial nerves grossly intact. moves all 4 extremities w/o difficulty. Affect pleasant  ECG: NSR 78 + LBBB (145ms) Personally reviewed   ASSESSMENT & PLAN:  1. Chronic biventricular YQM:GNOIBBC with a history of non-ischemic cardiomyopathy with EF 25-30% in 2016, improved to 35-40% on echo in 2017.   - Echo  01/2019 revealed EF 15-20%, G3DD, and severe RV hypokinesis.  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 LBBB may also be playing a role - Echo 11/20 EF 20-25% RV ok   - Stable NYHA II-early III. Volume status ok  - Continue torsemide 20 qod - Continue carvedilol 6.25 mg twice a day. - Continue Bidil to 1 tab tid - Off spironolactone due to CKD (Last creatinine 2.4) - Have avoided SGLT2i with GFR < 30. However most recent data suggests may be safe down to GFR 20. Can reconsider.  - LBBB may also be playing a role (though it is atypical) - He saw Dr. Caryl Comes in 12/20 and felt not to  be candidate for ICD and felt he would not benefit from CRT either. - Labs today. Repeat echo in 4 months   2. CAD: - Cath 02/07/19 with likely high-grade distal LM lesion.  - Case has been reviewed with Dr. Prescott Gum who has reviewed images. LAD only graftable vessel and given severe RV dysfunction, advanced age, fraility and CKD not felt to be CABG candidate. Case reviewed with Dr. Burt Knack with regard to possibility of LM stenting. It is highly calcified lesion and with severely reduced EF would need probable Impella support but PAD likely precludes. Additionally, LV was down prior to CAD so may not improve with stenting. I suspect medical management may be best.  - Continues to do well with medical therapy. No s/s of ischemia.  - On ASA/Plavix. No bleeding  - Continue statin. Goal LDL < 70   3.CKD3: - Creatinine baseline 1.9-2.1,  - He now follows with [name].  Most recent creatinine 2.4 - We will recheck today - SBP in 90s here. Asked him to keep BP log. If SBP < 90 consistently will need to back down on Bidil  4. History of CVA: - He is on ASA 81, Plavix, and statin.  - No recent symptoms   5. DM type 2, poorly controlled: - Per primary. Hgba1c 15 in 8/20 - Have avoided SGLT2i with GFR < 30. No change  6. Falls - Again urged him to use 4-point cane or walker to stabilize his balance   Glori Bickers  MD 10:58 AM

## 2019-12-06 DIAGNOSIS — E785 Hyperlipidemia, unspecified: Secondary | ICD-10-CM | POA: Diagnosis not present

## 2019-12-06 DIAGNOSIS — Z008 Encounter for other general examination: Secondary | ICD-10-CM | POA: Diagnosis not present

## 2019-12-06 DIAGNOSIS — I509 Heart failure, unspecified: Secondary | ICD-10-CM | POA: Diagnosis not present

## 2019-12-06 DIAGNOSIS — J449 Chronic obstructive pulmonary disease, unspecified: Secondary | ICD-10-CM | POA: Diagnosis not present

## 2019-12-06 DIAGNOSIS — D6869 Other thrombophilia: Secondary | ICD-10-CM | POA: Diagnosis not present

## 2019-12-06 DIAGNOSIS — E1140 Type 2 diabetes mellitus with diabetic neuropathy, unspecified: Secondary | ICD-10-CM | POA: Diagnosis not present

## 2019-12-06 DIAGNOSIS — I4891 Unspecified atrial fibrillation: Secondary | ICD-10-CM | POA: Diagnosis not present

## 2019-12-06 DIAGNOSIS — Z89421 Acquired absence of other right toe(s): Secondary | ICD-10-CM | POA: Diagnosis not present

## 2019-12-06 DIAGNOSIS — Z794 Long term (current) use of insulin: Secondary | ICD-10-CM | POA: Diagnosis not present

## 2019-12-06 DIAGNOSIS — E1151 Type 2 diabetes mellitus with diabetic peripheral angiopathy without gangrene: Secondary | ICD-10-CM | POA: Diagnosis not present

## 2019-12-06 DIAGNOSIS — E1122 Type 2 diabetes mellitus with diabetic chronic kidney disease: Secondary | ICD-10-CM | POA: Diagnosis not present

## 2019-12-09 MED FILL — FERREX 150 CAPSULE: 150 | 90 days supply | Qty: 90 | Fill #1

## 2019-12-10 DIAGNOSIS — R69 Illness, unspecified: Secondary | ICD-10-CM | POA: Diagnosis not present

## 2019-12-15 ENCOUNTER — Other Ambulatory Visit (HOSPITAL_COMMUNITY): Payer: Self-pay | Admitting: Internal Medicine

## 2020-01-29 ENCOUNTER — Other Ambulatory Visit: Payer: Self-pay | Admitting: *Deleted

## 2020-01-29 NOTE — Patient Outreach (Signed)
[facility]) Care Management  01/29/2020  Wallace Gappa 1940/10/15 [id number]   Subjective: Telephone call to patient's home  number, no answer, left HIPAA compliant voicemail message for patient's wife/ designated party release) [name] and  (patient) [name], and requested call back.   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 Patient also has a history of chronic kidney disease stage 3, hypertension,Community acquired pneumonia of left lower lobe of lung, CAD,LBBB (left bundle branch block),Acute metabolic encephalopathy,Protein-calorie malnutrition, severe,and hyperlipidemia.     Assessment: Received [name] [facility] Liaison /MD referral on8/31/2020. Referral source: [name]. Referral reason: complex diabetes management. Screening follow up completed.[facility] Care Management Social Workerfollow upfor Advanced Directives and Smoking Cessation community resources,has been completed.RNCM willcontinue tofollow patient for diabeteschroniccase management.     Plan:RNCM will call patient's wife for2nd telephone outreach attempt, withinnext 7 business days, for chronic diabetes casemanagement, and assess for further CM needs.     Azilee Pirro [name], BSN, [place] Management [facility] Telephonic CM Phone: [phone] Fax: [phone]

## 2020-02-02 ENCOUNTER — Other Ambulatory Visit: Payer: Self-pay | Admitting: *Deleted

## 2020-02-02 NOTE — Patient Outreach (Signed)
[facility]) Care Management  02/02/2020  Derryl Uher March 08, 1941 [id number]   Subjective: Telephone call to patient's home  number, no answer, left HIPAA compliant voicemail message for patient's wife/ designated party release) [name] and  (patient) [name], and requested call back.   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 Patient also has a history of chronic kidney disease stage 3, hypertension,Community acquired pneumonia of left lower lobe of lung, CAD,LBBB (left bundle branch block),Acute metabolic encephalopathy,Protein-calorie malnutrition, severe,and hyperlipidemia.     Assessment: Received [name] [facility] Liaison /MD referral on8/31/2020. Referral source: [name]. Referral reason: complex diabetes management. Screening follow up completed.[facility] Care Management Social Workerfollow upfor Advanced Directives and Smoking Cessation community resources,has been completed.RNCM willcontinue tofollow patient for diabeteschroniccase management.     Plan:RNCM will send unsuccessful outreach  letter, [facility] pamphlet, will call patient's wife for3rd telephone outreach attempt, withinnext 7 business days, forchronicdiabetes casemanagement, and assess for further CM needs.     Estle Sabella [name], BSN, [place] [facility] Telephonic CM Phone: [phone] Fax: [phone]

## 2020-02-04 ENCOUNTER — Ambulatory Visit: Payer: Self-pay | Admitting: *Deleted

## 2020-02-04 DIAGNOSIS — N184 Chronic kidney disease, stage 4 (severe): Secondary | ICD-10-CM | POA: Diagnosis not present

## 2020-02-04 DIAGNOSIS — E1122 Type 2 diabetes mellitus with diabetic chronic kidney disease: Secondary | ICD-10-CM | POA: Diagnosis not present

## 2020-02-04 DIAGNOSIS — D509 Iron deficiency anemia, unspecified: Secondary | ICD-10-CM | POA: Diagnosis not present

## 2020-02-05 ENCOUNTER — Other Ambulatory Visit: Payer: Self-pay | Admitting: *Deleted

## 2020-02-05 NOTE — Patient Outreach (Signed)
[facility]) Care Management  02/05/2020  [name] 1941/03/13 [id number]   Subjective:  Received voicemail message from [name], states she is returning call, she, and her husband [name] have been out of the country, have returned home, and requested call back on mobile number [phone]).  Telephone call to patient's wife / designated party release [name]) mobile number, no answer, left HIPAA compliant voicemail message, and requested call back.    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 Patient also has a history of chronic kidney disease stage 3, hypertension,Community acquired pneumonia of left lower lobe of lung, CAD,LBBB (left bundle branch block),Acute metabolic encephalopathy,Protein-calorie malnutrition, severe,and hyperlipidemia.     Assessment: Received [name] [facility] Liaison /MD referral on8/31/2020. Referral source: [name]. Referral reason: complex diabetes management. Screening follow up completed.[facility] Care Management Social Workerfollow upfor Advanced Directives and Smoking Cessation community resources,has been completed.RNCM willcontinue tofollow patient for diabeteschroniccase management., pending contact with patient's wife.      Plan:RNCM has sent unsuccessful outreach  letter, [facility] pamphlet, will call patient's wife for4thtelephone outreach attempt, withinnext21 business days, forchronicdiabetes casemanagement, and assess for further CM needs.     Sheronica Corey [name], BSN, [place] Management [facility] Telephonic CM Phone: [phone] Fax:  [phone]

## 2020-02-09 ENCOUNTER — Other Ambulatory Visit: Payer: Self-pay | Admitting: *Deleted

## 2020-02-09 NOTE — Patient Outreach (Addendum)
[name]) Care Management  02/09/2020  [name] 25-Mar-1941 [id number]   Subjective:  Received voicemail message from patient's wife/ designated party release [name]), times 2,  states she is returning call, and requested call back at mobile number.   Telephone call to patient's wife/ designated party release  [name]) mobile number, spoke with patient, and HIPAA verified.  States they have been out of the country for 1 month [name]) and have recently returned.   States patient is doing really good overall but having a problem with increased blood sugar fluctuations.  Blood sugars have been going very low (50) and then high (250) within 24 hours, over the last couple of weeks.  States she is taking patient's blood sugars 2 - 3 times a day, recording them, taking readings, and glucometer to MD's office as requested.   States she and patient are frustrated with the fluctuations.  States patient feels bad when his blood sugar is below 200, gets very shaky,  snacks to feel better, and treat low blood sugars.   States A1C had gone down to 9 but is now back up to 10.   Wife states patient had a follow up appointment with primary provider ([name] ) on 01/27/2020, patient had to stop for gas,  arrived 5 minutes late for appointment, and appointment was rescheduled with nurse practitioner for 02/04/2020.  States the 02/04/2020 nurse practitioner visit went ok, nurse practitioner feels patient is not eating the right foods, and insulin was increased to 15 units every morning and evening.   States blood on 02/05/2020 after insulin change was 50 and blood sugar this morning was 215.   Wife disagrees with nurse practitioner regarding patient's eating, wife states she is well educated on diabetes, cooks healthy foods for patient, and patient snacks while she is at work because he is feeling bad due to low blood sugars.   Wife feels the issue is patient maintaining a steady blood  sugar level.  States patient normally eats very small portions of food at meal time.  Discussed eating more frequent smaller meals to maintain consistent blood sugar levels, wife voices understanding, and states she was told by patient's nurse practitioner not to do that because of patient's diabetes type.   RNCM advised will follow up with patient's provider to seek clarification regarding diet,  report above symptoms, call wife with an update, wife voices understanding, and is in agreement.    States patient has the following appointments:  with nephrologist ([name])  on 02/14/2020, on 03/03/2020 with primary provider (nurse practitioner), and on 04/12/2020 with cardiologist ([name]).   Wife  states patient does not have any Air traffic controller, transportation, Data processing manager, or pharmacy needs at this time. States she is very appreciative of the follow up and is in agreement to continue to receive [place] Management information / services.     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 Patient also has a history of chronic kidney disease stage 3, hypertension,Community acquired pneumonia of left lower lobe of lung, CAD,LBBB (left bundle branch block),Acute metabolic encephalopathy,Protein-calorie malnutrition, severe,and hyperlipidemia.     Assessment: Received [name] [name] Liaison /MD referral on8/31/2020. Referral source: [name]. Referral reason: complex diabetes management. Screening follow up completed.[name] Care Management Social Workerfollow  upfor Advanced Directives and Smoking Cessation community resources,has been completed.RNCM willcontinue  tofollow patient for diabeteschroniccase management., pending contact with patient's wife.      Plan:RNCM will call patient's wife fortelephone outreach attempt, withinnext30 business days, forchronicdiabetes casemanagement, and assess for further CM needs.     Eliott Amparan [name], BSN, [place] Management [facility] Telephonic CM Phone: [phone] Fax: [phone]

## 2020-02-22 ENCOUNTER — Other Ambulatory Visit (HOSPITAL_COMMUNITY): Payer: Self-pay | Admitting: Internal Medicine

## 2020-02-26 ENCOUNTER — Ambulatory Visit: Payer: Self-pay | Admitting: *Deleted

## 2020-03-03 DIAGNOSIS — N184 Chronic kidney disease, stage 4 (severe): Secondary | ICD-10-CM | POA: Diagnosis not present

## 2020-03-03 DIAGNOSIS — Z794 Long term (current) use of insulin: Secondary | ICD-10-CM | POA: Diagnosis not present

## 2020-03-03 DIAGNOSIS — I130 Hypertensive heart and chronic kidney disease with heart failure and stage 1 through stage 4 chronic kidney disease, or unspecified chronic kidney disease: Secondary | ICD-10-CM | POA: Diagnosis not present

## 2020-03-03 DIAGNOSIS — E1122 Type 2 diabetes mellitus with diabetic chronic kidney disease: Secondary | ICD-10-CM | POA: Diagnosis not present

## 2020-03-06 DIAGNOSIS — R69 Illness, unspecified: Secondary | ICD-10-CM | POA: Diagnosis not present

## 2020-03-09 ENCOUNTER — Ambulatory Visit: Payer: Self-pay | Admitting: *Deleted

## 2020-03-10 ENCOUNTER — Ambulatory Visit: Payer: Self-pay | Admitting: *Deleted

## 2020-03-11 ENCOUNTER — Ambulatory Visit: Payer: Self-pay | Admitting: *Deleted

## 2020-03-12 ENCOUNTER — Ambulatory Visit: Payer: Self-pay | Admitting: *Deleted

## 2020-03-18 ENCOUNTER — Ambulatory Visit: Payer: Self-pay | Admitting: *Deleted

## 2020-03-19 ENCOUNTER — Ambulatory Visit: Payer: Self-pay | Admitting: *Deleted

## 2020-03-22 ENCOUNTER — Ambulatory Visit: Payer: Self-pay | Admitting: *Deleted

## 2020-03-23 ENCOUNTER — Ambulatory Visit: Payer: Self-pay | Admitting: *Deleted

## 2020-03-24 ENCOUNTER — Ambulatory Visit: Payer: Self-pay | Admitting: *Deleted

## 2020-03-25 ENCOUNTER — Ambulatory Visit: Payer: Self-pay | Admitting: *Deleted

## 2020-03-30 ENCOUNTER — Ambulatory Visit: Payer: Self-pay | Admitting: *Deleted

## 2020-03-31 ENCOUNTER — Ambulatory Visit: Payer: Self-pay | Admitting: *Deleted

## 2020-04-02 ENCOUNTER — Ambulatory Visit: Payer: Self-pay | Admitting: *Deleted

## 2020-04-05 ENCOUNTER — Ambulatory Visit: Payer: Self-pay | Admitting: *Deleted

## 2020-04-08 ENCOUNTER — Ambulatory Visit: Payer: Self-pay | Admitting: *Deleted

## 2020-04-12 ENCOUNTER — Ambulatory Visit (HOSPITAL_BASED_OUTPATIENT_CLINIC_OR_DEPARTMENT_OTHER)
Admission: RE | Admit: 2020-04-12 | Discharge: 2020-04-12 | Disposition: A | Discharge status: Home / Self Care | Payer: Medicare HMO | Source: Ambulatory Visit | Attending: Internal Medicine | Admitting: Internal Medicine

## 2020-04-12 ENCOUNTER — Ambulatory Visit (HOSPITAL_COMMUNITY)
Admission: RE | Admit: 2020-04-12 | Discharge: 2020-04-12 | Disposition: A | Discharge status: Home / Self Care | Payer: Medicare HMO | Source: Ambulatory Visit | Attending: Internal Medicine | Admitting: Internal Medicine

## 2020-04-12 ENCOUNTER — Encounter (HOSPITAL_COMMUNITY): Payer: Self-pay | Admitting: Internal Medicine

## 2020-04-12 ENCOUNTER — Other Ambulatory Visit: Payer: Self-pay

## 2020-04-12 VITALS — BP 122/78 | HR 70 | Wt 168.0 lb

## 2020-04-12 DIAGNOSIS — N184 Chronic kidney disease, stage 4 (severe): Secondary | ICD-10-CM | POA: Diagnosis not present

## 2020-04-12 DIAGNOSIS — I5022 Chronic systolic (congestive) heart failure: Secondary | ICD-10-CM

## 2020-04-12 DIAGNOSIS — Z7902 Long term (current) use of antithrombotics/antiplatelets: Secondary | ICD-10-CM | POA: Insufficient documentation

## 2020-04-12 DIAGNOSIS — Z8249 Family history of ischemic heart disease and other diseases of the circulatory system: Secondary | ICD-10-CM | POA: Insufficient documentation

## 2020-04-12 DIAGNOSIS — I5082 Biventricular heart failure: Secondary | ICD-10-CM | POA: Insufficient documentation

## 2020-04-12 DIAGNOSIS — Z794 Long term (current) use of insulin: Secondary | ICD-10-CM | POA: Insufficient documentation

## 2020-04-12 DIAGNOSIS — Z79899 Other long term (current) drug therapy: Secondary | ICD-10-CM | POA: Diagnosis not present

## 2020-04-12 DIAGNOSIS — Z833 Family history of diabetes mellitus: Secondary | ICD-10-CM | POA: Insufficient documentation

## 2020-04-12 DIAGNOSIS — E1136 Type 2 diabetes mellitus with diabetic cataract: Secondary | ICD-10-CM | POA: Diagnosis not present

## 2020-04-12 DIAGNOSIS — I447 Left bundle-branch block, unspecified: Secondary | ICD-10-CM | POA: Insufficient documentation

## 2020-04-12 DIAGNOSIS — E1122 Type 2 diabetes mellitus with diabetic chronic kidney disease: Secondary | ICD-10-CM | POA: Diagnosis not present

## 2020-04-12 DIAGNOSIS — I428 Other cardiomyopathies: Secondary | ICD-10-CM | POA: Insufficient documentation

## 2020-04-12 DIAGNOSIS — Z89431 Acquired absence of right foot: Secondary | ICD-10-CM | POA: Diagnosis not present

## 2020-04-12 DIAGNOSIS — E1165 Type 2 diabetes mellitus with hyperglycemia: Secondary | ICD-10-CM | POA: Diagnosis not present

## 2020-04-12 DIAGNOSIS — I2510 Atherosclerotic heart disease of native coronary artery without angina pectoris: Secondary | ICD-10-CM

## 2020-04-12 DIAGNOSIS — Z8673 Personal history of transient ischemic attack (TIA), and cerebral infarction without residual deficits: Secondary | ICD-10-CM | POA: Diagnosis not present

## 2020-04-12 DIAGNOSIS — I130 Hypertensive heart and chronic kidney disease with heart failure and stage 1 through stage 4 chronic kidney disease, or unspecified chronic kidney disease: Secondary | ICD-10-CM | POA: Insufficient documentation

## 2020-04-12 DIAGNOSIS — Z9119 Patient's noncompliance with other medical treatment and regimen: Secondary | ICD-10-CM | POA: Diagnosis not present

## 2020-04-12 DIAGNOSIS — F17210 Nicotine dependence, cigarettes, uncomplicated: Secondary | ICD-10-CM | POA: Insufficient documentation

## 2020-04-12 DIAGNOSIS — Z7982 Long term (current) use of aspirin: Secondary | ICD-10-CM | POA: Diagnosis not present

## 2020-04-12 DIAGNOSIS — N183 Chronic kidney disease, stage 3 unspecified: Secondary | ICD-10-CM | POA: Insufficient documentation

## 2020-04-12 DIAGNOSIS — E785 Hyperlipidemia, unspecified: Secondary | ICD-10-CM | POA: Diagnosis not present

## 2020-04-12 DIAGNOSIS — Z9181 History of falling: Secondary | ICD-10-CM | POA: Diagnosis not present

## 2020-04-12 DIAGNOSIS — I4891 Unspecified atrial fibrillation: Secondary | ICD-10-CM | POA: Insufficient documentation

## 2020-04-12 LAB — CBC
HCT: 41.4 % (ref 39.0–52.0)
Hemoglobin: 12.9 g/dL — ABNORMAL LOW (ref 13.0–17.0)
MCH: 30.1 pg (ref 26.0–34.0)
MCHC: 31.2 g/dL (ref 30.0–36.0)
MCV: 96.7 fL (ref 80.0–100.0)
Platelets: 193 10*3/uL (ref 150–400)
RBC: 4.28 MIL/uL (ref 4.22–5.81)
RDW: 13.7 % (ref 11.5–15.5)
WBC: 6.1 10*3/uL (ref 4.0–10.5)
nRBC: 0 % (ref 0.0–0.2)

## 2020-04-12 LAB — BASIC METABOLIC PANEL
Anion gap: 7 (ref 5–15)
BUN: 29 mg/dL — ABNORMAL HIGH (ref 8–23)
CO2: 26 mmol/L (ref 22–32)
Calcium: 8.9 mg/dL (ref 8.9–10.3)
Chloride: 106 mmol/L (ref 98–111)
Creatinine, Ser: 2.29 mg/dL — ABNORMAL HIGH (ref 0.61–1.24)
GFR calc Af Amer: 31 mL/min — ABNORMAL LOW (ref >60)
GFR calc non Af Amer: 27 mL/min — ABNORMAL LOW (ref >60)
Glucose, Bld: 317 mg/dL — ABNORMAL HIGH (ref 70–99)
Potassium: 4.8 mmol/L (ref 3.5–5.1)
Sodium: 139 mmol/L (ref 135–145)

## 2020-04-12 LAB — ECHOCARDIOGRAM COMPLETE
Area-P 1/2: 2.91 cm2
Calc EF: 49.7 %
Single Plane A2C EF: 52.4 %
Single Plane A4C EF: 46.2 %

## 2020-04-12 LAB — BRAIN NATRIURETIC PEPTIDE: B Natriuretic Peptide: 95.7 pg/mL (ref 0.0–100.0)

## 2020-04-12 NOTE — Progress Notes (Signed)
  Echocardiogram 2D Echocardiogram has been performed.  Jennette Dubin 04/12/2020, 11:46 AM

## 2020-04-12 NOTE — Progress Notes (Signed)
ADVANCED HF CLINIC NOTE  PCP: Dr Dagmar Hait  Primary HF Cardiologist: Dr Haroldine Laws   HPI: [name] is a 79 y.o. 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 Echo EF 15-20% with severe RV dysfunction. Had RHC/LHC with volume overlaod and high grade disttal LM lesion. CT surgery consulted. He was not thought to be surgery candidate given severe RV dysfunction, advanced age, fraility and CKD. Case reviewed with Dr. Burt Knack with regard to possibility of LM stenting. It is highly calcified lesion and with severely reduced EF would need probable Impella support but PAD likely precludes. Additionally, LV was down prior to CAD so may not improve with stenting -> medical management recommended. Improved markedly with diuresis. D/c weight 146   Readmitted to [facility] in 8/20 with poorly controlled DM2 (HgBa1c 15%). Treated with IV insulin and IVF.  Today he returns for HF follow up with his wife. Doing pretty well. Says he can get around [facility] but still seems to have trouble with his balance - especially on uneven ground. Had a fall in the garden recently. 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 Refuses to use a cane. Denies SOB as long as he takes his time. Occasional mild edema when he misses his torsemide. No orthopnea or PND. No CP.  SBP at home followed closely 90-150. Mainly 110-120 HR 65-75. Follows with [name]. Last creatinine 2.9.    Echo today 04/12/20 EF 35-40%    He saw Dr. Caryl Comes in 12/20 and felt not to be candidate for ICD and felt he would not benefit from CRT either.   RHC/LHC 02/07/2019   Mid RCA to Dist RCA lesion is 50% stenosed.  Prox RCA lesion is 40% stenosed.  Mid LM to Dist LM lesion is  80% stenosed.  RPDA lesion is 90% stenosed.  Ost Cx to Prox Cx lesion is 80% stenosed.  Dist LM to Ost LAD lesion is 60% stenosed.  Prox Cx to Mid Cx lesion is 50% stenosed.  Prox LAD to Mid LAD lesion is 70% stenosed.  Findings: Ao = 136/76 (97) LV = 139/28 RA = 9 RV = 65/10 PA = 67/29 (48) PCW = 36 Fick cardiac output/index = 4.1/2.2 PVR = 3.0 WU SVR = 1721 Assessment: 1. Severe 2v CAD with probable high grade, calcified distal left main lesion 2. EF 20-25% due to iCM 3. Elevated filling pressures with moderately reduced CO  Echo  EF 40-45% in 2015  EF 25-30% in 2016 EF 35-40% in 2017 EF 15-20% in 2020  Severe RV dysfunction.   ROS: All systems negative except as listed in HPI, PMH and Problem List.  SH:  Social History   Socioeconomic History  . Marital status: Married    Spouse name: Not on file  . Number of children: 3  . Years of education: Not on file  . Highest education level: Not on file  Occupational History  . Occupation: Retired  Tobacco Use  . Smoking status: Current Every Day Smoker    Packs/day: 0.25    Years: 20.00    Pack years: 5.00    Types: Cigarettes  . Smokeless tobacco: Never Used  . Tobacco comment: Quit June 2017, patient states stil  smoking some 1 or 2 cigarettes per day.  Vaping Use  . Vaping Use: Never used  Substance and Sexual Activity  . Alcohol use: Yes    Alcohol/week: 0.0 standard drinks    Comment: rarely  . Drug use: No  . Sexual activity: Not on file  Other Topics Concern  . Not on file  Social History Narrative  . Not on file   Social Determinants of Health   Financial Resource Strain:   . Difficulty of Paying Living Expenses:   Food Insecurity:   . Worried About Charity fundraiser in the Last Year:   . Arboriculturist in the Last Year:   Transportation Needs: No Transportation Needs  . Lack of Transportation (Medical): No  . Lack of Transportation (Non-Medical): No  Physical Activity:   . Days of  Exercise per Week:   . Minutes of Exercise per Session:   Stress:   . Feeling of Stress :   Social Connections:   . Frequency of Communication with Friends and Family:   . Frequency of Social Gatherings with Friends and Family:   . Attends Religious Services:   . Active Member of Clubs or Organizations:   . Attends Archivist Meetings:   Marland Kitchen Marital Status:   Intimate Partner Violence:   . Fear of Current or Ex-Partner:   . Emotionally Abused:   Marland Kitchen Physically Abused:   . Sexually Abused:     FH:  Family History  Problem Relation Age of Onset  . Diabetes Mellitus II Mother   . Heart disease Brother        before age 26  . Colon cancer Neg Hx   . Esophageal cancer Neg Hx   . Stomach cancer Neg Hx   . Anesthesia problems Neg Hx   . Hypotension Neg Hx   . Malignant hyperthermia Neg Hx   . Pseudochol deficiency Neg Hx     Past Medical History:  Diagnosis Date  . Allergy   . Atrial fibrillation ([place])   . Cataract   . CHF (congestive heart failure) ([place])   . Chronic kidney disease    Renal Insuffiecny  . Colon polyps 2012   Colonoscopy  . Diabetes mellitus    type 2  . Diverticulosis 2012   Colonoscopy   . Hyperglycemia 04/2019  . Hyperlipidemia   . Hypertension   . LBBB (left bundle branch block)   . Pneumonia 2015   3 times  . Stroke [facility]) 2015    Current Outpatient Medications  Medication Sig Dispense Refill  . aspirin EC 81 MG EC tablet Take 1 tablet (81 mg total) by mouth daily.    . carvedilol (COREG) 6.25 MG tablet TAKE 1 TABLET BY MOUTH TWICE A DAY 180 tablet 2  . clopidogrel (PLAVIX) 75 MG tablet Take 75 mg by mouth daily.    . Insulin Pen Needle 32G X 4 MM MISC Use as directed daily at bedtime. 60 each 0  . iron polysaccharides (FERREX 150) 150 MG capsule Take 1 capsule (150 mg total) by mouth daily. 30 capsule 0  . isosorbide-hydrALAZINE (BIDIL) 20-37.5 MG tablet Take 1 tablet by mouth 3 (three) times daily. 270 tablet 3  .  isosorbide-hydrALAZINE (BIDIL) 20-37.5 MG tablet Take by mouth 3 (three) times daily.    . Multiple Vitamin (MULTIVITAMIN WITH MINERALS) TABS tablet Take 1 tablet by mouth daily.    Marland Kitchen NOVOLIN 70/30 FLEXPEN (70-30) 100 UNIT/ML KwikPen INJECT 6 UNITS BEFORE  BREAKFAST AND 12 UNITS BEFORE DINNER EVERY DAY    . simvastatin (ZOCOR) 40 MG tablet Take 40 mg by mouth daily.     Marland Kitchen torsemide (DEMADEX) 20 MG tablet TAKE 1 TABLET BY MOUTH EVERY OTHER DAY 45 tablet 2   No current facility-administered medications for this encounter.      Vitals:   04/12/20 1306  BP: 122/78  Pulse: 70  SpO2: 99%  Weight: 76.2 kg (168 lb)   Wt Readings from Last 3 Encounters:  04/12/20 76.2 kg (168 lb)  12/03/19 78.6 kg (173 lb 3.2 oz)  09/25/19 79.2 kg (174 lb 9.6 oz)    PHYSICAL EXAM: General:  Thin elderly appearing. No resp difficulty HEENT: normal Neck: supple. JVP 8 Carotids 2+ bilat; no bruits. No lymphadenopathy or thryomegaly appreciated. Cor: PMI nondisplaced. Regular rate & rhythm. No rubs, gallops or murmurs. Lungs: clear Abdomen: soft, nontender, nondistended. No hepatosplenomegaly. No bruits or masses. Good bowel sounds. Extremities: no cyanosis, clubbing, rash, trace - 1+ edema  S/p right transmet amputation  Neuro: alert & orientedx3, cranial nerves grossly intact. moves all 4 extremities w/o difficulty. Affect pleasant    ASSESSMENT & PLAN:  1. Chronic biventricular WFU:XNATFTD with a history of non-ischemic cardiomyopathy with EF 25-30% in 2016, improved to 35-40% on echo in 2017.   - Echo  01/2019 revealed EF 15-20%, G3DD, and severe RV hypokinesis.  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 LBBB may also be playing a role - Echo 11/20 EF 20-25% RV ok   - Echo today (04/12/20) EF improving ~35-40%  - Stable NYHA III. Volume status ok  - Continue torsemide 20 qod - Continue carvedilol 6.25 mg twice a day. - Continue Bidil to 1 tab tid - Off spironolactone due to CKD (Last creatinine 2.9) - Have  avoided SGLT2i with GFR < 30. However most recent data suggests safe down to GFR 20 and also provides renal protectivity..  - LBBB may also be playing a role (though it is atypical) - He saw Dr. Caryl Comes in 12/20 and felt not to be candidate for ICD and felt he would not benefit from CRT either. - LWill get labs today. They see [name] next week and I asked them to discuss initiation of Farxiga with him   2. CAD: - Cath 02/07/19 with likely high-grade distal LM lesion.  - Case has been reviewed with Dr. Prescott Gum who has reviewed images. LAD only graftable vessel and given severe RV dysfunction, advanced age, fraility and CKD not felt to be CABG candidate. Case reviewed with Dr. Burt Knack with regard to possibility of LM stenting. It is highly calcified lesion and with severely reduced EF would need probable Impella support but PAD likely precludes. Additionally, LV was down prior to CAD so may not improve with stenting. I suspect medical management may be best.  - Continues to do well with medical therapy. No current s/s ischemia  - On ASA/Plavix. No bleeding  - Continue statin. Goal LDL < 70  3.CKD3: - Creatinine baseline 1.9-2.1,  - He now follows with [name].  Most recent creatinine 2.9 (in 12/03/19) - Recheck labs today  - SBP in 90s here. Asked him to keep BP log. If SBP < 90 consistently will need to back down on Bidil  4. History of CVA: - He is on ASA 81, Plavix, and statin.  - No recent symptoms   5. DM type 2, poorly controlled: - Per primary. Hgba1c 15 in 8/20 - See SGLT2i discussion as  above  6. Falls - Again urged him to use 4-point cane or walker to stabilize his balance   Glori Bickers  MD 1:33 PM

## 2020-04-12 NOTE — Patient Instructions (Signed)
It was great to see you today! No medication changes are needed at this time.  Labs today We will only contact you if something comes back abnormal or we need to make some changes. Otherwise no news is good news!  Your physician recommends that you schedule a follow-up appointment in: 4-6 months with Dr Haroldine Laws -please call in December to be added to the schedule  Do the following things EVERYDAY: 1) Weigh yourself in the morning before breakfast. Write it down and keep it in a log. 2) Take your medicines as prescribed 3) Eat low salt foods--Limit salt (sodium) to 2000 mg per day.  4) Stay as active as you can everyday 5) Limit all fluids for the day to less than 2 liters  At the [facility], you and your health needs are our priority. As part of our continuing mission to provide you with exceptional heart care, we have created designated Provider Care Teams. These Care Teams include your primary Cardiologist (physician) and Advanced Practice Providers (APPs- Physician Assistants and Nurse Practitioners) who all work together to provide you with the care you need, when you need it.   You may see any of the following providers on your designated Care Team at your next follow up: Marland Kitchen Dr Glori Bickers . Dr Loralie Champagne . Darrick Grinder, NP . Lyda Jester, PA . Audry Riles, PharmD   Please be sure to bring in all your medications bottles to every appointment.

## 2020-04-14 ENCOUNTER — Ambulatory Visit: Payer: Self-pay | Admitting: *Deleted

## 2020-04-20 ENCOUNTER — Ambulatory Visit: Payer: Self-pay | Admitting: *Deleted

## 2020-04-21 DIAGNOSIS — I130 Hypertensive heart and chronic kidney disease with heart failure and stage 1 through stage 4 chronic kidney disease, or unspecified chronic kidney disease: Secondary | ICD-10-CM | POA: Diagnosis not present

## 2020-04-21 DIAGNOSIS — N184 Chronic kidney disease, stage 4 (severe): Secondary | ICD-10-CM | POA: Diagnosis not present

## 2020-04-21 DIAGNOSIS — E1122 Type 2 diabetes mellitus with diabetic chronic kidney disease: Secondary | ICD-10-CM | POA: Diagnosis not present

## 2020-04-21 DIAGNOSIS — I5020 Unspecified systolic (congestive) heart failure: Secondary | ICD-10-CM | POA: Diagnosis not present

## 2020-04-21 DIAGNOSIS — Z794 Long term (current) use of insulin: Secondary | ICD-10-CM | POA: Diagnosis not present

## 2020-04-23 DIAGNOSIS — I2510 Atherosclerotic heart disease of native coronary artery without angina pectoris: Secondary | ICD-10-CM | POA: Diagnosis not present

## 2020-04-23 DIAGNOSIS — H3500 Unspecified background retinopathy: Secondary | ICD-10-CM | POA: Diagnosis not present

## 2020-04-23 DIAGNOSIS — D631 Anemia in chronic kidney disease: Secondary | ICD-10-CM | POA: Diagnosis not present

## 2020-04-23 DIAGNOSIS — E1122 Type 2 diabetes mellitus with diabetic chronic kidney disease: Secondary | ICD-10-CM | POA: Diagnosis not present

## 2020-04-23 DIAGNOSIS — N183 Chronic kidney disease, stage 3 unspecified: Secondary | ICD-10-CM | POA: Diagnosis not present

## 2020-04-23 DIAGNOSIS — J449 Chronic obstructive pulmonary disease, unspecified: Secondary | ICD-10-CM | POA: Diagnosis not present

## 2020-04-23 DIAGNOSIS — I509 Heart failure, unspecified: Secondary | ICD-10-CM | POA: Diagnosis not present

## 2020-04-23 DIAGNOSIS — E785 Hyperlipidemia, unspecified: Secondary | ICD-10-CM | POA: Diagnosis not present

## 2020-04-23 DIAGNOSIS — N2581 Secondary hyperparathyroidism of renal origin: Secondary | ICD-10-CM | POA: Diagnosis not present

## 2020-04-23 DIAGNOSIS — E875 Hyperkalemia: Secondary | ICD-10-CM | POA: Diagnosis not present

## 2020-04-26 DIAGNOSIS — I2510 Atherosclerotic heart disease of native coronary artery without angina pectoris: Secondary | ICD-10-CM | POA: Diagnosis not present

## 2020-04-26 DIAGNOSIS — E785 Hyperlipidemia, unspecified: Secondary | ICD-10-CM | POA: Diagnosis not present

## 2020-04-26 DIAGNOSIS — N2581 Secondary hyperparathyroidism of renal origin: Secondary | ICD-10-CM | POA: Diagnosis not present

## 2020-04-26 DIAGNOSIS — I509 Heart failure, unspecified: Secondary | ICD-10-CM | POA: Diagnosis not present

## 2020-04-26 DIAGNOSIS — N183 Chronic kidney disease, stage 3 unspecified: Secondary | ICD-10-CM | POA: Diagnosis not present

## 2020-04-26 DIAGNOSIS — E1122 Type 2 diabetes mellitus with diabetic chronic kidney disease: Secondary | ICD-10-CM | POA: Diagnosis not present

## 2020-04-26 DIAGNOSIS — H3500 Unspecified background retinopathy: Secondary | ICD-10-CM | POA: Diagnosis not present

## 2020-04-26 DIAGNOSIS — J449 Chronic obstructive pulmonary disease, unspecified: Secondary | ICD-10-CM | POA: Diagnosis not present

## 2020-04-26 DIAGNOSIS — N189 Chronic kidney disease, unspecified: Secondary | ICD-10-CM | POA: Diagnosis not present

## 2020-04-26 DIAGNOSIS — E875 Hyperkalemia: Secondary | ICD-10-CM | POA: Diagnosis not present

## 2020-04-26 DIAGNOSIS — D631 Anemia in chronic kidney disease: Secondary | ICD-10-CM | POA: Diagnosis not present

## 2020-04-27 ENCOUNTER — Ambulatory Visit: Payer: Self-pay | Admitting: *Deleted

## 2020-04-27 IMAGING — DX CHEST - 2 VIEW
2 series · 2 of 2 positions shown · non-contrast
Comparison: 04/03/2019

CLINICAL DATA: Shortness of breath for 2 weeks.  Recent pneumonia.

EXAM:
CHEST - 2 VIEW

[chest pa]
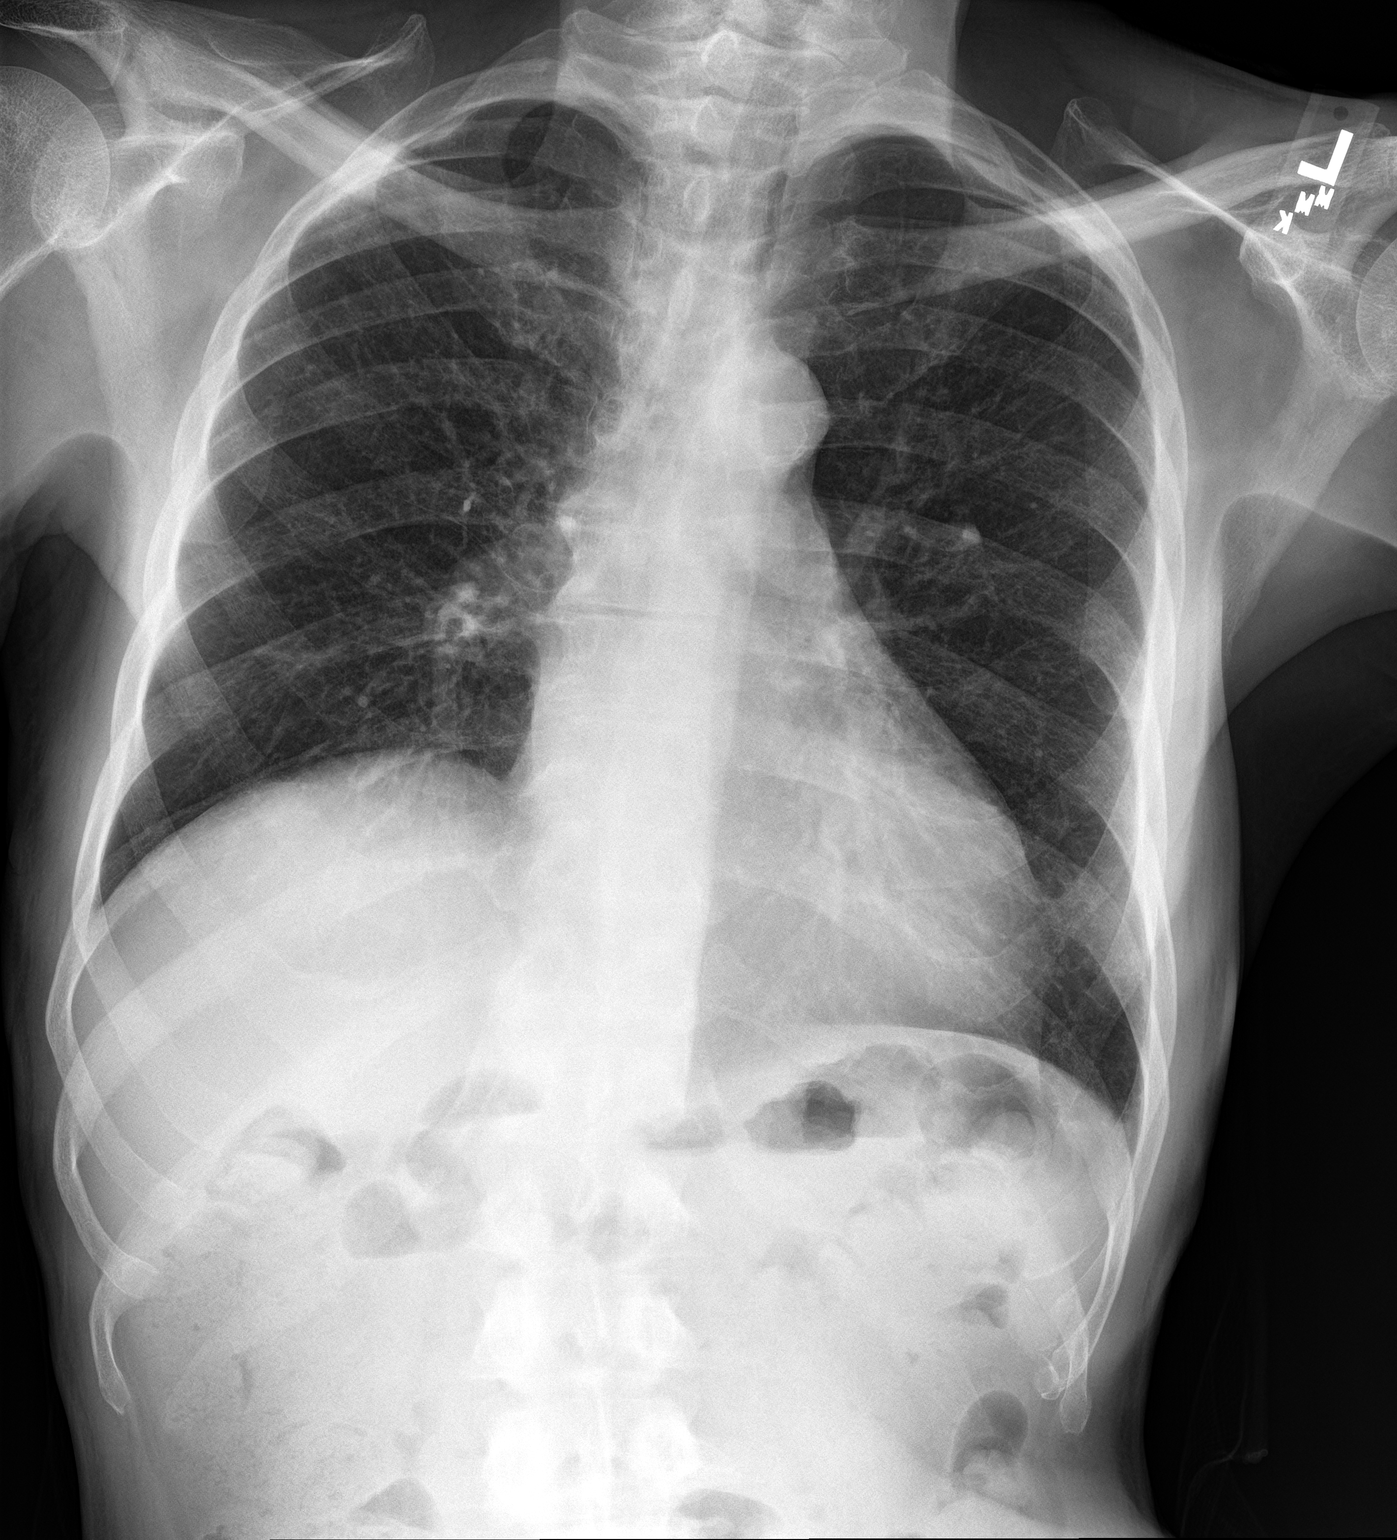

[chest lat]
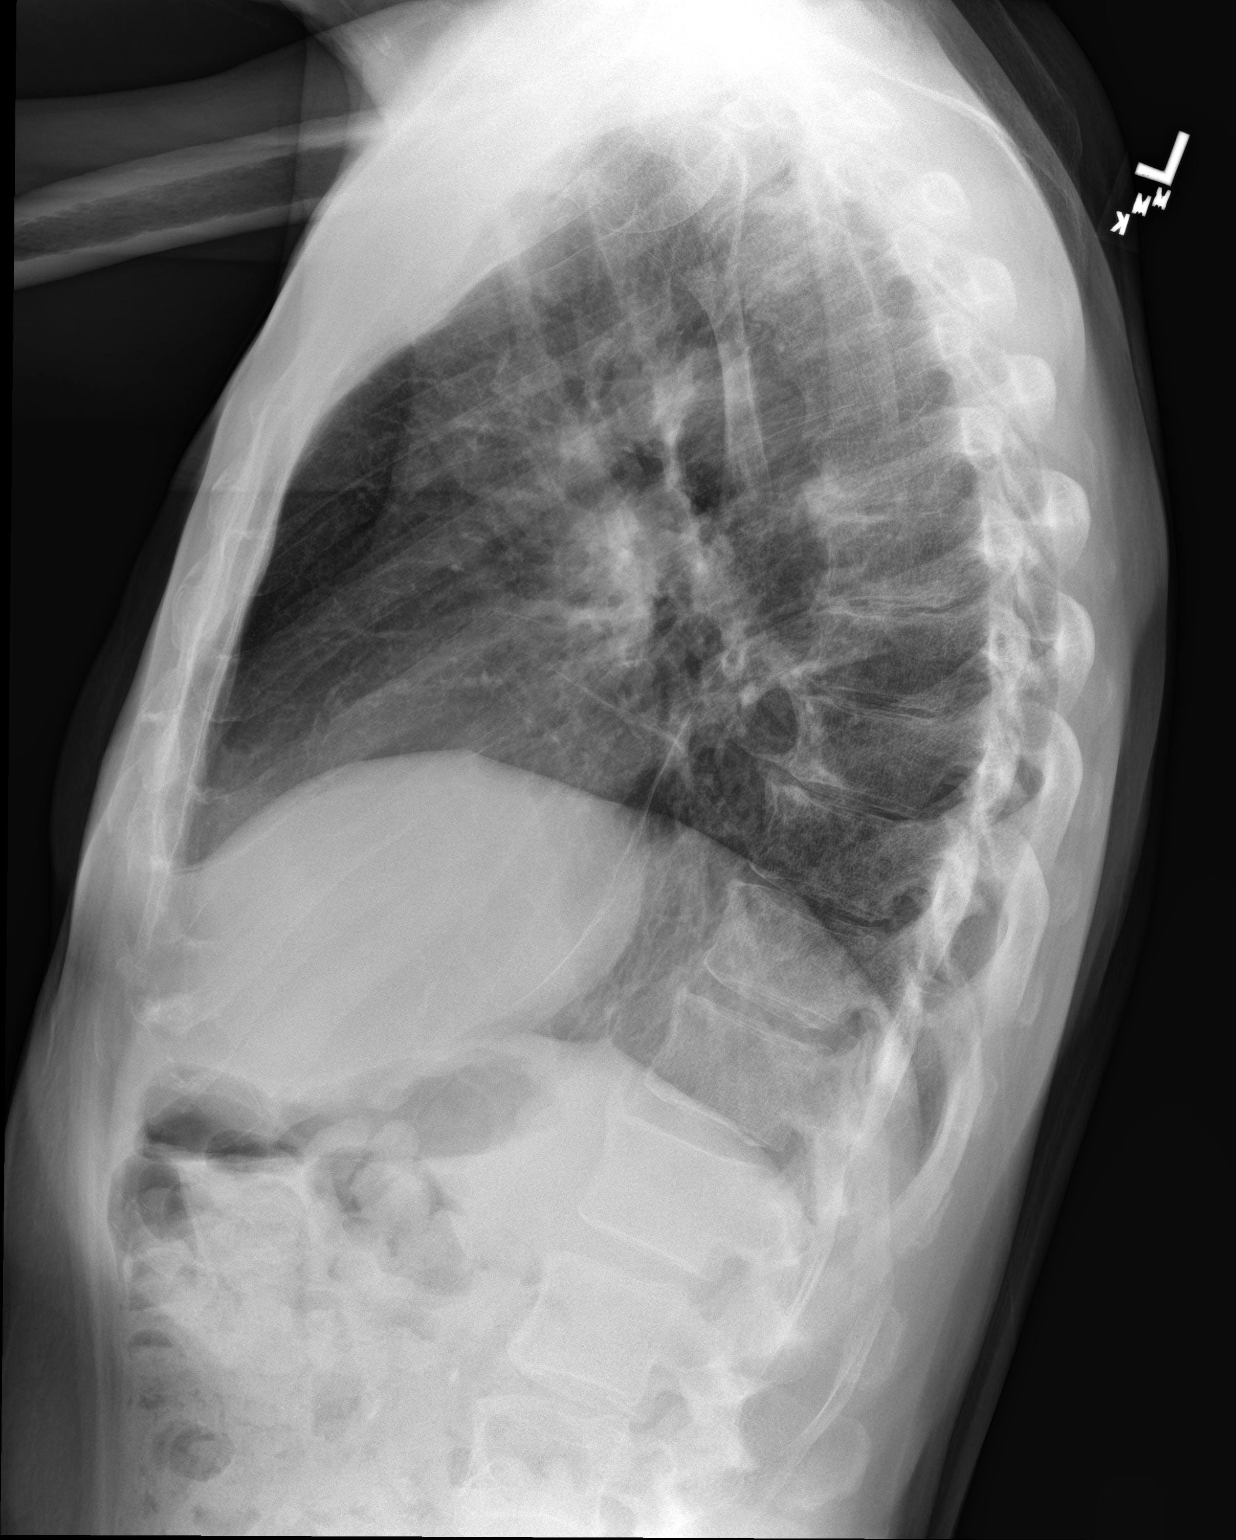

[2 of 2 positions shown; findings below may reference images not displayed]

FINDINGS: Cardiac silhouette is normal in size. No mediastinal or hilar
masses. There is no evidence of adenopathy.

Elevated right hemidiaphragm, stable.

Lungs are clear. Previously seen left lung base infiltrate has
resolved.

No pleural effusion or pneumothorax.

Skeletal structures are intact.
IMPRESSION: 1. No acute cardiopulmonary disease. Resolved left lung base
pneumonia. No residual pleural effusion.

## 2020-05-06 ENCOUNTER — Other Ambulatory Visit: Payer: Self-pay | Admitting: *Deleted

## 2020-05-06 ENCOUNTER — Ambulatory Visit: Payer: Self-pay | Admitting: *Deleted

## 2020-05-06 ENCOUNTER — Encounter: Payer: Self-pay | Admitting: *Deleted

## 2020-05-06 NOTE — Patient Outreach (Signed)
[facility]) Care Management [place] Telephone Outreach New referral to transfer care team  05/06/2020  [name] [birth year] [id number]  Successful initial telephone outreach to [name], spouse/ caregiver, on [place] for patient [name], referred to this RN CM by [place] leadership 05/06/20 for assumption of care from previous [facility] RN CM.  Patient has had no recent ED or inpatient hospital admissions; from review of EHR, it appears that patient has been followed by previous [facility] RN CM for ongoing support/ care coordination/ disease management of chronic disease state of DM.  Patient has history including, but not limited to, DM with A1-C of 15 (05/21/2019); CHF/ CAD; HTN/ HLD; CKD-III; and previous CVA.  HIPAA/ identity verified with caregiver/ spouse today; purpose of call discussed with caregiver who provides ongoing verbal consent to [facility] CM services.  Today, caregiver reports "all is fine," and "going good" and she denies clinical concerns around patient's condition.  Caregiver reports that she is a retired Chief Strategy OfficermedChiropractor and has a good baseline understanding of patient's medical conditions/ ongoing plan of care; states she handles all of patient's medical affairs for him.  -- continues monitoring/ recording blood sugars twice daily, fasting and HS; reports fasting blood sugar this morning of "278" and states that they do not record patient's blood sugars on paper; states that they store blood sugar values in the glucometer and take glucometer to provider offices for review of same; caregiver is not interested in recording blood sugars on separate piece of paper  -- reports ongoing intermittent fluctuations of blood sugars, with occasional low readings < 100; caregiver reports she provides orange juice to patient when he has low readings; she also reports consistently high blood sugars at various times of day, but states that they have been told by PCP that patient should  have a target blood sugar of "200;" states, "he just feels so bad when it goes below 200."  -- reports last visit with PCP office "about 2 weeks ago," at which time an updated A1-C was obtained; reports "it was "9.0"  Reports this "is about where they want him to be."  -- follows "healthy diet," verbalizes that they "try to avoid sweets;" does not otherwise designate specific diet  -- wife provides transportation to all scheduled provider appointments; reviewed recent office visit with HF provider and PCP  -- patient does not monitor/ record daily weights; states "they have never been told to."  Discussed value of/ rational for daily weight monitoring at home in setting of CHF, however, caregiver reports "he will not do that;" and that she understands what to do if he "starts holding on to his fluid."  Encouraged her to talk to patient about rationale for daily weight monitoring as the earliest indicator of fluid retention; she will consider  -- no concerns/ issues/ problems around medications:  Medication review completed today and updated according to caregiver report; caregiver confirms that she manages patient's medications, and no discrepancies or concerns were identified as a result of medication review today.  Caregiver denies further issues, concerns, or problems today.  I provided/ confirmed that caregiver/ patient has my direct phone number, the main [facility] CM office phone number, and the [facility] CM 24-hour nurse advice phone number should issues arise prior to next scheduled [facility] CM outreach.  Encouraged them to contact me directly if needs, questions, issues, or concerns arise prior to next scheduled outreach; they agreed to do so.  Plan:  Patient will take medications  as prescribed and will attend all scheduled provider appointments  Patient/ caregiver will promptly notify care providers for any new concerns/ issues/ problems that arise  Patient/ caregiver will continue to monitor blood  sugars at home twice daily  [facility]N CM outreach to continue with scheduled phone call in 2 weeks  Peter Rack, RN, BSN, [place] Care Management  [phone]

## 2020-05-13 ENCOUNTER — Ambulatory Visit: Payer: Self-pay | Admitting: *Deleted

## 2020-05-21 ENCOUNTER — Other Ambulatory Visit: Payer: Self-pay | Admitting: *Deleted

## 2020-05-21 ENCOUNTER — Encounter: Payer: Self-pay | Admitting: *Deleted

## 2020-05-21 NOTE — Patient Outreach (Signed)
[facility]) Care Management [place] Telephone Outreach Care Coordination  05/21/2020  [name] 1940/10/01 [id number]  [facility] CM Telephone Outreach Care Coordination re:   [name], referred to this RN CM by [place] leadership 05/06/20 for assumption of care from previous [facility] RN CM. Patient has had no recent ED or inpatient hospital admissions; from review of EHR, it appears that patient has been followed by previous [facility] RN CM for ongoing support/ care coordination/ disease management of chronic disease state of DM.  Patient has history including, but not limited to, DM with A1-C of 15 (05/21/2019); CHF/ CAD; HTN/ HLD; CKD-III; and previous CVA.  Received call-back from "Baker Janus" at PCP office (Dr. [facility]); she confirms that patient had last office visit with Dr. Dagmar Hait on 04/21/20-- A1-C at that time was noted at 9.1; she also reports that prior to that value, the previous A1-C was 10.6 on 02/04/20.  She is unable to share PCP A1-C goal today, stating that she is not a part of PCP clinical staff.  Oneta Rack, RN, BSN, [facility] Care Management  [phone]

## 2020-05-21 NOTE — Patient Outreach (Signed)
[facility]) Care Management [place] Telephone Outreach  05/21/2020  Oran Dillenburg [birth year] [id number]  Successful telephone outreach to [name], spouse/ caregiver, on [place] for patient [name], referred to this RN CM by [place] leadership 05/06/20 for assumption of care from previous [facility] RN CM.  Patient has had no recent ED or inpatient hospital admissions; from review of EHR, it appears that patient has been followed by previous [facility] RN CM for ongoing support/ care coordination/ disease management of chronic disease state of DM.  Patient has history including, but not limited to, DM with A1-C of 15 (05/21/2019); CHF/ CAD; HTN/ HLD; CKD-III; and previous CVA.  HIPAA/ identity verified with caregiver/ spouse today; patient participates in phone call today while phone on speaker mode.  Patient/ caregiver report "everything going fine;" patient denies pain, clinical concerns and sounds to be in no distress throughout call today.  Reports they are on their way out to complete errands.  -- assessment updated; will send to PCP as quarterly update -- continue monitoring/ recording blood sugars at home; report "usually" between 100-200 with occasional ongoing episodes of hypoglycemia that are "easily" fixed with provision of food; deny concerns or unusual trends around recent blood sugars at home  -- confirms that patient continues following "healthy diet," wife states she understands carb modified/ heart healthy low sodium diet and follows-- this was encouraged -- value of monitoring A1-C trends discussed with patient and caregiver; they are pleased with patient's most recent (self- reported) A1-C of "9.0." Discussed that reduction of A1-C even a small amount might help in prevention of DM complications ---- placed care coordination outreach to patient's PCP to verify patient's most recent A1-C; left voice mail with clinical office staff requesting call back -- upcoming  appointment with renal provider 06/03/20 -- recent fall without injury "a couple of weeks ago;" reports patient has occasional falls when out feeding his chickens due to "being off-balance";  Spouse states patient has a cane/ walker/ wheelchair for use, however, reports patient refuses to use; when questioned as to why, she states, "because he is a man and he does what he wants to whether it's right or not."  Fall risks/ prevention discussed and I encouraged patient to consider using his cane and walker and explained the risks of injury with falls.  Patient does not respond, however, wife informs me that she knows he "will not use" cane/ walker, regardless of the risk of injury  Caregiver/ patient both deny further issues, concerns, or problems today. I provided/ confirmed that caregiver/ patient hasmy direct phone number, the main [facility] CM office phone number, and the [facility] CM 24-hour nurse advice phone number should issues arise prior to next scheduled [facility] CM outreach by phone next month.  Encouraged them to contact me directly if needs, questions, issues, or concerns arise prior to next scheduled outreach; they agreed to do so.  Plan:  Patient will take medications as prescribed and will attend all scheduled provider appointments  Patient/ caregiver will promptly notify care providers for any new concerns/ issues/ problems that arise  Patient/ caregiver will continue to monitor blood sugars at home twice daily  I will share today's [facility] CM note/ care plan goals with patient's PCP as quarterly summary/ update  [facility]N CM outreach to continue with scheduled phone call next month  Oneta Rack, RN, BSN, [place] Care Management  [phone]

## 2020-06-03 DIAGNOSIS — N183 Chronic kidney disease, stage 3 unspecified: Secondary | ICD-10-CM | POA: Diagnosis not present

## 2020-06-03 DIAGNOSIS — E875 Hyperkalemia: Secondary | ICD-10-CM | POA: Diagnosis not present

## 2020-06-03 DIAGNOSIS — D631 Anemia in chronic kidney disease: Secondary | ICD-10-CM | POA: Diagnosis not present

## 2020-06-03 DIAGNOSIS — E1122 Type 2 diabetes mellitus with diabetic chronic kidney disease: Secondary | ICD-10-CM | POA: Diagnosis not present

## 2020-06-03 DIAGNOSIS — J449 Chronic obstructive pulmonary disease, unspecified: Secondary | ICD-10-CM | POA: Diagnosis not present

## 2020-06-03 DIAGNOSIS — I2510 Atherosclerotic heart disease of native coronary artery without angina pectoris: Secondary | ICD-10-CM | POA: Diagnosis not present

## 2020-06-03 DIAGNOSIS — N2581 Secondary hyperparathyroidism of renal origin: Secondary | ICD-10-CM | POA: Diagnosis not present

## 2020-06-03 DIAGNOSIS — H3500 Unspecified background retinopathy: Secondary | ICD-10-CM | POA: Diagnosis not present

## 2020-06-03 DIAGNOSIS — N189 Chronic kidney disease, unspecified: Secondary | ICD-10-CM | POA: Diagnosis not present

## 2020-06-03 DIAGNOSIS — E785 Hyperlipidemia, unspecified: Secondary | ICD-10-CM | POA: Diagnosis not present

## 2020-06-23 ENCOUNTER — Other Ambulatory Visit: Payer: Self-pay | Admitting: *Deleted

## 2020-06-23 ENCOUNTER — Encounter: Payer: Self-pay | Admitting: *Deleted

## 2020-06-23 NOTE — Patient Outreach (Signed)
[place] [facility]) Care Management [facility] CM Telephone Outreach Unsuccessful outreach attempt # 1- previously engaged caregiver/ patient  06/23/2020  [name] January 15, 1941 [id number]  Unsuccessful telephone outreachto [name], spouse/ caregiver, on [facility] DPR for patient [name], referred to this RN CM by [place] leadership 05/06/20 for assumption of care from previous [facility] RN CM. Patient has had no recent ED or inpatient hospital admissions; from review of EHR, it appears that patient has been followed by previous [facility] RN CM for ongoing support/ care coordination/ disease management of chronic disease state of DM.  Patient has history including, but not limited to, DM with A1-C of 15 (05/21/2019); CHF/ CAD; HTN/ HLD; CKD-III; and previous CVA.  HIPAA compliant voice mail message left for patient's caregiver, requesting return call back.  Plan:  Will re-attempt [facility]N CM telephone outreach within 4 business days if I do not hear back from caregiver/ patient first  Oneta Rack, RN, BSN, [place] Care Management  [phone]

## 2020-06-29 ENCOUNTER — Other Ambulatory Visit: Payer: Self-pay | Admitting: *Deleted

## 2020-06-29 ENCOUNTER — Encounter: Payer: Self-pay | Admitting: *Deleted

## 2020-06-29 NOTE — Patient Outreach (Signed)
[facility]) Care Management [facility] CM Telephone Outreach Unsuccessful (consecutive) outreach attempt # 2- previously engaged patient/ caregiver  06/29/2020  [name] 05-05-1941 [id number]  Unsuccessful (consecutive) second telephone outreachto [name], spouse/ caregiver, on [place] for patient [name], referred to this RN CM by [place] leadership 05/06/20 for assumption of care from previous [facility] RN CM. Patient has had no recent ED or inpatient hospital admissions; from review of EHR, it appears that patient has been followed by previous [facility] RN CM for ongoing support/ care coordination/ disease management of chronic disease state of DM.  Patient has history including, but not limited to, DM with A1-C of 15 (05/21/2019); CHF/ CAD; HTN/ HLD; CKD-III; and previous CVA.  HIPAA compliant voice mail message left for patient/ caregiver, requesting return call back.  Plan:  Will place [facility] CM unsuccessful patient outreach letter in mail requesting call back in writing as this is the second consecutive unsuccessful outreach attempt in previously engaged caregiver/ patient  Will re-attempt [facility]N CM telephone outreach within 4 business days if I do not hear back from patient/ caregiver first  Oneta Rack, RN, BSN, [place] Care Management  [phone]

## 2020-07-05 ENCOUNTER — Other Ambulatory Visit: Payer: Self-pay | Admitting: *Deleted

## 2020-07-05 ENCOUNTER — Encounter: Payer: Self-pay | Admitting: *Deleted

## 2020-07-05 NOTE — Patient Outreach (Signed)
[name]) Care Management [facility] CM Telephone Outreach Unsuccessful (consecutive) third outreach attempt, previously engaged patient/ caregiver  07/05/2020  [name] 06-18-1941 [id number]  Unsuccessful (consecutive) third telephone outreachto [name], spouse/ caregiver, on [place] for patient [name], referred to this RN CM by [facility] CM leadership 05/06/20 for assumption of care from previous [facility] RN CM. Patient has had no recent ED or inpatient hospital admissions; from review of EHR, it appears that patient has been followed by previous [facility] RN CM for ongoing support/ care coordination/ disease management of chronic disease state of DM.  Patient has history including, but not limited to, DM with A1-C of 15 (05/21/2019); CHF/ CAD; HTN/ HLD; CKD-III; and previous CVA.  HIPAA compliant voice mail message left for patient/ caregiver, requesting return call back.  Plan:  Verified [facility] CM unsuccessful patient outreach letter placed in mail requesting call back in writing on 06/29/20  Will re-attempt [facility] CM telephone outreach in 4 weeks for final outreach attempt if I do not hear back from patient/ caregiver first  Oneta Rack, RN, BSN, [employer] Coordinator [facility] Care Management  [phone]

## 2020-07-13 DIAGNOSIS — Z23 Encounter for immunization: Secondary | ICD-10-CM | POA: Diagnosis not present

## 2020-07-13 DIAGNOSIS — N184 Chronic kidney disease, stage 4 (severe): Secondary | ICD-10-CM | POA: Diagnosis not present

## 2020-07-13 DIAGNOSIS — I130 Hypertensive heart and chronic kidney disease with heart failure and stage 1 through stage 4 chronic kidney disease, or unspecified chronic kidney disease: Secondary | ICD-10-CM | POA: Diagnosis not present

## 2020-07-13 DIAGNOSIS — E1122 Type 2 diabetes mellitus with diabetic chronic kidney disease: Secondary | ICD-10-CM | POA: Diagnosis not present

## 2020-07-13 DIAGNOSIS — Z794 Long term (current) use of insulin: Secondary | ICD-10-CM | POA: Diagnosis not present

## 2020-07-22 DIAGNOSIS — H524 Presbyopia: Secondary | ICD-10-CM | POA: Diagnosis not present

## 2020-07-22 DIAGNOSIS — Z0100 Encounter for examination of eyes and vision without abnormal findings: Secondary | ICD-10-CM | POA: Diagnosis not present

## 2020-07-27 ENCOUNTER — Encounter: Payer: Self-pay | Admitting: *Deleted

## 2020-07-27 ENCOUNTER — Other Ambulatory Visit: Payer: Self-pay | Admitting: *Deleted

## 2020-07-27 ENCOUNTER — Telehealth (HOSPITAL_COMMUNITY): Payer: Self-pay | Admitting: *Deleted

## 2020-07-27 NOTE — Patient Outreach (Signed)
[facility]) Care Management [place] Telephone Outreach  07/27/2020  Aldric Wenzler 05/12/1941 [id number]  Successful telephone outreach to [name], spouse/ caregiver, on [facility] DPR for patient Hipolito Martinezlopez, referred to this RN CM by [facility] leadership 05/06/20 for assumption of care from previous [facility] RN CM. Patient has had no recent ED or inpatient hospital admissions; from review of EHR, it appears that patient has been followed by previous [facility] RN CM for ongoing support/ care coordination/ disease management of chronic disease state of DM.  Patient has history including, but not limited to, DM with A1-C of 15 (05/21/2019); CHF/ CAD; HTN/ HLD; CKD-III; and previous CVA.  HIPAA/ identity verified; spouse reports today that she is concerned about patient because his blood sugars remain "all over the place" and patient has been complaining of feeling weak over the last few days; states patient has not wanted to take his prescribed dose of insulin due to feeling weak, as he is afraid he will "drop too low."  Reports fasting blood sugars over last several days between 77-211; reports post-prandial values of "mainly over 200."  Spouse reports she has contacted patient's doctors and have not heard anything back; she is hoping that she will hear from doctors today with guidance around patient's reported non-specific symptoms.  [facility] CM Telephone Outreach Care Coordination placed to patient's PCP office- left voice mail with office team requesting that caregiver be contacted to triage patient's reported symptoms.  Discussed basic sick day rules and encouraged caregiver to keep a frequent check on patient's blood sugars and adjust when he refuses to eat- encouraged her to discuss appropriate individualized dosing of insulin with PCP.  Caregiver denies further issues, concerns, or problems today.  I confirmed that caregiver has my direct phone number, the main [facility] CM office phone number, and  the [facility] CM 24-hour nurse advice phone number should issues arise prior to next scheduled [facility] CM outreach.  Encouraged patient to contact me directly if needs, questions, issues, or concerns arise prior to next scheduled outreach; patient agreed to do so.  Plan:  [facility] CM outreach scheduled in 4 weeks, sooner if indicated  Oneta Rack, RN, BSN, [employer] Coordinator [name] Care Management  [phone]

## 2020-07-27 NOTE — Patient Instructions (Signed)
Goals Addressed              This Visit's Progress   .  Make and Keep All Appointments        Follow Up Date 08/24/2020   - ask family or friend for a ride - keep a calendar with appointment dates    Why is this important?   Part of staying healthy is seeing the doctor for follow-up care.  If you forget your appointments, there are some things you can do to stay on track.    Notes:   07/27/20 -- reviewed recent and upcoming provider appointments with spouse- confirmed she will continue to provide transportation and has plans to attend all scheduled appointments -- placed care coordination outreach to PCP to initiate triage/ possible urgent appointment to address patient's nonspecific symptoms of weakness with request to address insulin management    .  Manage My Medicine        Follow Up Date 08/24/2020   - call if I am sick and can't take my medicine - keep a list of all the medicines I take; vitamins and herbals too    Why is this important?   These steps will help you keep on track with your medicines.    Notes:   07/27/20: -- discussed basic sick day rules with caregiver and placed care coordination to PCP to initiate triage follow up for nonspecific symptoms of weakness reported today by caregiver    .  Monitor and Manage My Blood Sugar        Follow Up Date 08/24/20   - check blood sugar at prescribed times - check blood sugar if I feel it is too high or too low - enter blood sugar readings and medication or insulin into daily log - take the blood sugar log to all doctor visits - take the blood sugar meter to all doctor visits    Why is this important?   Checking your blood sugar at home helps to keep it from getting very high or very low.  Writing the results in a diary or log helps the doctor know how to care for you.  Your blood sugar log should have the time, date and the results.  Also, write down the amount of insulin or other medicine that you take.    Other information, like what you ate, exercise done and how you were feeling, will also be helpful.     Notes:   07/27/20: -- reviewed with caregiver patients recent blood sugar values at home    .  COMPLETED: per caregiver, "we want to keep him on the right track so we don't have any hospital visits" (pt-stated)   On track    Diabetes Mellitus and Sick Day Management Blood sugar (glucose) can be difficult to control when you are sick. Common illnesses that can cause problems for people with diabetes (diabetes mellitus) include colds, fever, flu (influenza), nausea, vomiting, and diarrhea. These illnesses can cause stress and loss of body fluids (dehydration), and those issues can cause blood glucose levels to increase. Because of this, it is very important to take your insulin and diabetes medicines and eat some form of carbohydrate when you are sick. You should make a plan for days when you are sick (sick day plan) as part of your diabetes management plan. You and your health care provider should make this plan in advance. The following guidelines are intended to help you manage an illness that lasts for  about 24 hours or less. Your health care provider may also give you more specific instructions. What do I need to do to manage my blood glucose?   Check your blood glucose every 2-4 hours, or as often as told by your health care provider.  Know your sick day treatment goals. Your target blood glucose levels may be different when you are sick.  If you use insulin, take your usual dose. ? If your blood glucose continues to be too high, you may need to take an additional insulin dose as told by your health care provider.  If you use oral diabetes medicine, you may need to stop taking it if you are not able to eat or drink normally. Ask your health care provider about whether you need to stop taking these medicines while you are sick.  If you use injectable hormone medicines other than  insulin to control your diabetes, ask your health care provider about whether you need to stop taking these medicines while you are sick. What else can I do to manage my diabetes when I am sick? Check your ketones  If you have type 1 diabetes, check your urine ketones every 4 hours.  If you have type 2 diabetes, check your urine ketones as often as told by your health care provider. Drink fluids  Drink enough fluid to keep your urine clear or pale yellow. This is especially important if you have a fever, vomiting, or diarrhea. Those symptoms can lead to dehydration.  Follow any instructions from your health care provider about beverages to avoid. ? Do not drink alcohol, caffeine, or drinks that contain a lot of sugar. Take medicines as directed  Take-over-the-counter and prescription medicines only as told by your health care provider.  Check medicine labels for added sugars. Some medicines may contain sugar or types of sugars that can raise your blood glucose level. What foods can I eat when I am sick?  You need to eat some form of carbohydrates when you are sick. You should eat 45-50 grams (45-50 g) of carbohydrates every 3-4 hours until you feel better. All of the food choices below contain about 15 g of carbohydrates. Plan ahead and keep some of these foods around so you have them if you get sick.  4-6 oz (120-177 mL) carbonated beverage that contains sugar, such as regular (not diet) soda. You may be able to drink carbonated beverages more easily if you open the beverage and let it sit at room temperature for a few minutes before drinking.   of a twin frozen ice pop.  4 oz (120 g) regular gelatin.  4 oz (120 mL) fruit juice.  4 oz (120 g) ice cream or frozen yogurt.  2 oz (60 g) sherbet.  8 oz (240 mL) clear broth or soup.  4 oz (120 g) regular custard.  4 oz (120 g) regular pudding.  8 oz (240 g) plain yogurt.  1 slice bread or toast.  6 saltine crackers.  5  vanilla wafers. Questions to ask your health care provider Consider asking the following questions so you know what to do on days when you are sick:  Should I adjust my diabetes medicines?  How often do I need to check my blood glucose?  What supplies do I need to manage my diabetes at home when I am sick?  What number can I call if I have questions?  What foods and drinks should I avoid? Contact a health care provider if:  You develop symptoms of diabetic ketoacidosis, such as: ? Fatigue. ? Weight loss. ? Excessive thirst. ? Light-headedness. ? Fruity or sweet-smelling breath. ? Excessive urination. ? Vision changes. ? Confusion or irritability. ? Nausea. ? Vomiting. ? Rapid breathing. ? Pain in the abdomen. ? Feeling flushed.  You are unable to drink fluids without vomiting.  You have any of the following for more than 6 hours: ? Nausea. ? Vomiting. ? Diarrhea.  Your blood glucose is at or above 240 mg/dL (13.3 mmol/L), even after you take an additional insulin dose.  You have a change in how you think, feel, or act (mental status).  You develop another serious illness.  You have been sick or have had a fever for 2 days or longer and you are not getting better. Get help right away if:  Your blood glucose is lower than 54 mg/dL (3.0 mmol/L).  You have difficulty breathing.  You have moderate or high ketone levels in your urine.  You used emergency glucagon to treat low blood glucose. Summary  Blood sugar (glucose) can be difficult to control when you are sick. Common illnesses that can cause problems for people with diabetes (diabetes mellitus) include colds, fever, flu (influenza), nausea, vomiting, and diarrhea.  Illnesses can cause stress and loss of body fluids (dehydration), and those issues can cause blood glucose levels to increase.  Make a plan for days when you are sick (sick day plan) as part of your diabetes management plan. You and your health  care provider should make this plan in advance.  It is very important to take your insulin and diabetes medicines and to eat some form of carbohydrate when you are sick.  Contact your health care provider if have problems managing your blood glucose levels when you are sick, or if you have been sick or had a fever for 2 days or longer and are not getting better. This information is not intended to replace advice given to you by your health care provider. Make sure you discuss any questions you have with your health care provider. Document Revised: 06/09/2016 Document Reviewed: 06/09/2016 Elsevier Patient Education  [place].    Hypoglycemia Hypoglycemia is when the sugar (glucose) level in your blood is too low. Signs of low blood sugar may include:  Feeling: ? Hungry. ? Worried or nervous (anxious). ? Sweaty and clammy. ? Confused. ? Dizzy. ? Sleepy. ? Sick to your stomach (nauseous).  Having: ? A fast heartbeat. ? A headache. ? A change in your vision. ? Tingling or no feeling (numbness) around your mouth, lips, or tongue. ? Jerky movements that you cannot control (seizure).  Having trouble with: ? Moving (coordination). ? Sleeping. ? Passing out (fainting). ? Getting upset easily (irritability). Low blood sugar can happen to people who have diabetes and people who do not have diabetes. Low blood sugar can happen quickly, and it can be an emergency. Treating low blood sugar Low blood sugar is often treated by eating or drinking something sugary right away, such as:  Fruit juice, 4-6 oz (120-150 mL).  Regular soda (not diet soda), 4-6 oz (120-150 mL).  Low-fat milk, 4 oz (120 mL).  Several pieces of hard candy.  Sugar or honey, 1 Tbsp (15 mL). Treating low blood sugar if you have diabetes If you can think clearly and swallow safely, follow the 15:15 rule:  Take 15 grams of a fast-acting carb (carbohydrate). Talk with your doctor about how much you should  take.  Always keep a source of fast-acting carb with you, such as: ? Sugar tablets (glucose pills). Take 3-4 pills. ? 6-8 pieces of hard candy. ? 4-6 oz (120-150 mL) of fruit juice. ? 4-6 oz (120-150 mL) of regular (not diet) soda. ? 1 Tbsp (15 mL) honey or sugar.  Check your blood sugar 15 minutes after you take the carb.  If your blood sugar is still at or below 70 mg/dL (3.9 mmol/L), take 15 grams of a carb again.  If your blood sugar does not go above 70 mg/dL (3.9 mmol/L) after 3 tries, get help right away.  After your blood sugar goes back to normal, eat a meal or a snack within 1 hour.  Treating very low blood sugar If your blood sugar is at or below 54 mg/dL (3 mmol/L), you have very low blood sugar (severe hypoglycemia). This may also cause:  Passing out.  Jerky movements you cannot control (seizure).  Losing consciousness (coma). This is an emergency. Do not wait to see if the symptoms will go away. Get medical help right away. Call your local emergency services (911 in the [place] Do not drive yourself to the hospital. If you have very low blood sugar and you cannot eat or drink, you may need a glucagon shot (injection). A family member or friend should learn how to check your blood sugar and how to give you a glucagon shot. Ask your doctor if you need to have a glucagon shot kit at home. Follow these instructions at home: General instructions  Take over-the-counter and prescription medicines only as told by your doctor.  Stay aware of your blood sugar as told by your doctor.  Limit alcohol intake to no more than 1 drink a day for nonpregnant women and 2 drinks a day for men. One drink equals 12 oz of beer (355 mL), 5 oz of wine (148 mL), or 1 oz of hard liquor (44 mL).  Keep all follow-up visits as told by your doctor. This is important. If you have diabetes:   Follow your diabetes care plan as told by your doctor. Make sure you: ? Know the signs of low blood  sugar. ? Take your medicines as told. ? Follow your exercise and meal plan. ? Eat on time. Do not skip meals. ? Check your blood sugar as often as told by your doctor. Always check it before and after exercise. ? Follow your sick day plan when you cannot eat or drink normally. Make this plan ahead of time with your doctor.  Share your diabetes care plan with: ? Your work or school. ? People you live with.  Check your pee (urine) for ketones: ? When you are sick. ? As told by your doctor.  Carry a card or wear jewelry that says you have diabetes. Contact a doctor if:  You have trouble keeping your blood sugar in your target range.  You have low blood sugar often. Get help right away if:  You still have symptoms after you eat or drink something sugary.  Your blood sugar is at or below 54 mg/dL (3 mmol/L).  You have jerky movements that you cannot control.  You pass out. These symptoms may be an emergency. Do not wait to see if the symptoms will go away. Get medical help right away. Call your local emergency services (911 in the [place] Do not drive yourself to the hospital. Summary  Hypoglycemia happens when the level of sugar (glucose) in your blood is  too low.  Low blood sugar can happen to people who have diabetes and people who do not have diabetes. Low blood sugar can happen quickly, and it can be an emergency.  Make sure you know the signs of low blood sugar and know how to treat it.  Always keep a source of sugar (fast-acting carb) with you to treat low blood sugar. This information is not intended to replace advice given to you by your health care provider. Make sure you discuss any questions you have with your health care provider. Document Revised: 01/02/2019 Document Reviewed: 10/15/2015 Elsevier Patient Education  2020 [employer].

## 2020-07-27 NOTE — Telephone Encounter (Signed)
pts wife left VM stating pt had low energy and was weak since stopping bidil. I called back to get more information at [phone] no answer/left vm requesting return call.

## 2020-07-28 ENCOUNTER — Telehealth (HOSPITAL_COMMUNITY): Payer: Self-pay | Admitting: Vascular Surgery

## 2020-07-28 DIAGNOSIS — I5020 Unspecified systolic (congestive) heart failure: Secondary | ICD-10-CM | POA: Diagnosis not present

## 2020-07-28 DIAGNOSIS — E875 Hyperkalemia: Secondary | ICD-10-CM | POA: Diagnosis not present

## 2020-07-28 DIAGNOSIS — E1122 Type 2 diabetes mellitus with diabetic chronic kidney disease: Secondary | ICD-10-CM | POA: Diagnosis not present

## 2020-07-28 DIAGNOSIS — N184 Chronic kidney disease, stage 4 (severe): Secondary | ICD-10-CM | POA: Diagnosis not present

## 2020-07-28 DIAGNOSIS — R5383 Other fatigue: Secondary | ICD-10-CM | POA: Diagnosis not present

## 2020-07-28 DIAGNOSIS — I130 Hypertensive heart and chronic kidney disease with heart failure and stage 1 through stage 4 chronic kidney disease, or unspecified chronic kidney disease: Secondary | ICD-10-CM | POA: Diagnosis not present

## 2020-07-28 DIAGNOSIS — R059 Cough, unspecified: Secondary | ICD-10-CM | POA: Diagnosis not present

## 2020-07-28 NOTE — Telephone Encounter (Signed)
Pt wife called stating pt needs a sooner appt, she said she called yesterday and no one called her back .. please advise

## 2020-07-29 ENCOUNTER — Ambulatory Visit (HOSPITAL_COMMUNITY)
Admission: RE | Admit: 2020-07-29 | Discharge: 2020-07-29 | Disposition: A | Discharge status: Home / Self Care | Payer: Medicare HMO | Source: Ambulatory Visit | Attending: Internal Medicine | Admitting: Internal Medicine

## 2020-07-29 ENCOUNTER — Other Ambulatory Visit: Payer: Self-pay

## 2020-07-29 ENCOUNTER — Encounter (HOSPITAL_COMMUNITY): Payer: Self-pay | Admitting: Internal Medicine

## 2020-07-29 VITALS — BP 96/54 | HR 76 | Wt 164.0 lb

## 2020-07-29 DIAGNOSIS — R42 Dizziness and giddiness: Secondary | ICD-10-CM | POA: Insufficient documentation

## 2020-07-29 DIAGNOSIS — I428 Other cardiomyopathies: Secondary | ICD-10-CM | POA: Diagnosis not present

## 2020-07-29 DIAGNOSIS — I130 Hypertensive heart and chronic kidney disease with heart failure and stage 1 through stage 4 chronic kidney disease, or unspecified chronic kidney disease: Secondary | ICD-10-CM | POA: Insufficient documentation

## 2020-07-29 DIAGNOSIS — Z8249 Family history of ischemic heart disease and other diseases of the circulatory system: Secondary | ICD-10-CM | POA: Insufficient documentation

## 2020-07-29 DIAGNOSIS — I447 Left bundle-branch block, unspecified: Secondary | ICD-10-CM

## 2020-07-29 DIAGNOSIS — F17210 Nicotine dependence, cigarettes, uncomplicated: Secondary | ICD-10-CM | POA: Insufficient documentation

## 2020-07-29 DIAGNOSIS — N1832 Chronic kidney disease, stage 3b: Secondary | ICD-10-CM | POA: Insufficient documentation

## 2020-07-29 DIAGNOSIS — Z794 Long term (current) use of insulin: Secondary | ICD-10-CM | POA: Diagnosis not present

## 2020-07-29 DIAGNOSIS — E1122 Type 2 diabetes mellitus with diabetic chronic kidney disease: Secondary | ICD-10-CM | POA: Insufficient documentation

## 2020-07-29 DIAGNOSIS — E1165 Type 2 diabetes mellitus with hyperglycemia: Secondary | ICD-10-CM | POA: Insufficient documentation

## 2020-07-29 DIAGNOSIS — I5082 Biventricular heart failure: Secondary | ICD-10-CM | POA: Insufficient documentation

## 2020-07-29 DIAGNOSIS — Z7982 Long term (current) use of aspirin: Secondary | ICD-10-CM | POA: Diagnosis not present

## 2020-07-29 DIAGNOSIS — Z7902 Long term (current) use of antithrombotics/antiplatelets: Secondary | ICD-10-CM | POA: Insufficient documentation

## 2020-07-29 DIAGNOSIS — Z79899 Other long term (current) drug therapy: Secondary | ICD-10-CM | POA: Insufficient documentation

## 2020-07-29 DIAGNOSIS — I2510 Atherosclerotic heart disease of native coronary artery without angina pectoris: Secondary | ICD-10-CM | POA: Insufficient documentation

## 2020-07-29 DIAGNOSIS — I951 Orthostatic hypotension: Secondary | ICD-10-CM | POA: Diagnosis not present

## 2020-07-29 DIAGNOSIS — I5022 Chronic systolic (congestive) heart failure: Secondary | ICD-10-CM | POA: Diagnosis not present

## 2020-07-29 DIAGNOSIS — Z8673 Personal history of transient ischemic attack (TIA), and cerebral infarction without residual deficits: Secondary | ICD-10-CM | POA: Insufficient documentation

## 2020-07-29 DIAGNOSIS — Z833 Family history of diabetes mellitus: Secondary | ICD-10-CM | POA: Insufficient documentation

## 2020-07-29 DIAGNOSIS — N184 Chronic kidney disease, stage 4 (severe): Secondary | ICD-10-CM | POA: Diagnosis not present

## 2020-07-29 DIAGNOSIS — E785 Hyperlipidemia, unspecified: Secondary | ICD-10-CM | POA: Diagnosis not present

## 2020-07-29 DIAGNOSIS — R69 Illness, unspecified: Secondary | ICD-10-CM | POA: Diagnosis not present

## 2020-07-29 MED ORDER — TORSEMIDE 20 MG PO TABS
20.0000 mg | ORAL_TABLET | ORAL | 2 refills | Status: DC
Start: 2020-07-29 — End: 2020-12-29

## 2020-07-29 MED ORDER — MIDODRINE HCL 2.5 MG PO TABS
2.5000 mg | ORAL_TABLET | Freq: Three times a day (TID) | ORAL | 3 refills | Status: DC
Start: 2020-07-29 — End: 2020-12-03

## 2020-07-29 NOTE — Patient Instructions (Signed)
Decrease Torsemide to 20 mg only on Monday and Friday  Start Midodrine 2.5 mg Three times a day   Please wear your compression hose daily, place them on as soon as you get up in the morning and remove before you go to bed at night.  You have been ordered a PYP Scan.  This is done in the Radiology Department of [facility].  When you come for this test please plan to be there 2-3 hours.  Keep follow up as scheduled in 2 months with echocardiogram  If you have any questions or concerns before your next appointment please send [place] a message through [place] or call our office at [phone].    TO LEAVE A MESSAGE FOR THE NURSE SELECT OPTION 2, PLEASE LEAVE A MESSAGE INCLUDING: . YOUR NAME . DATE OF BIRTH . CALL BACK NUMBER . REASON FOR CALL**this is important as we prioritize the call backs  House AS LONG AS YOU CALL BEFORE 4:00 PM  At the [facility], you and your health needs are our priority. As part of our continuing mission to provide you with exceptional heart care, we have created designated Provider Care Teams. These Care Teams include your primary Cardiologist (physician) and Advanced Practice Providers (APPs- Physician Assistants and Nurse Practitioners) who all work together to provide you with the care you need, when you need it.   You may see any of the following providers on your designated Care Team at your next follow up: Marland Kitchen Dr Glori Bickers . Dr Loralie Champagne . Darrick Grinder, NP . Lyda Jester, PA . Audry Riles, PharmD   Please be sure to bring in all your medications bottles to every appointment.

## 2020-07-29 NOTE — Progress Notes (Addendum)
ADVANCED HF CLINIC NOTE  PCP: Dr Dagmar Hait  Primary HF Cardiologist: Dr Haroldine Laws   HPI: [name] is a 79 y.o. 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 Echo EF 15-20% with severe RV dysfunction. Had RHC/LHC with volume overlaod and high grade disttal LM lesion. CT surgery consulted. He was not thought to be surgery candidate given severe RV dysfunction, advanced age, fraility and CKD. Case reviewed with Dr. Burt Knack with regard to possibility of LM stenting. It is highly calcified lesion and with severely reduced EF would need probable Impella support but PAD likely precludes. Additionally, LV was down prior to CAD so may not improve with stenting -> medical management recommended. Improved markedly with diuresis. D/c weight 146   Readmitted to [facility] in 8/20 with poorly controlled DM2 (HgBa1c 15%). Treated with IV insulin and IVF.  Today he returns for HF follow up with his wife. Says over the past 1-2 months has been much more dizzy. Dizziness is much worse in the am and before he eats. Says the symptoms got worse after his insulin was increased. Says BS during events is usually 70-80. PCP decreased insulin yesterday and dizziness. No focal weakness or other neuro symptoms. Denies CP or SOB. Wife follows BP at home and SBP 120-130. Carvedilol and Bidil stopped by [name] (Nephrology)   Echo  04/12/20 EF 35-40%    He saw Dr. Caryl Comes in 12/20 and felt not to be candidate for ICD and felt he would not benefit from CRT either.   RHC/LHC 02/07/2019   Mid RCA to Dist RCA lesion is 50% stenosed.  Prox RCA lesion is 40% stenosed.  Mid LM to Dist LM lesion is 80% stenosed.  RPDA lesion is 90%  stenosed.  Ost Cx to Prox Cx lesion is 80% stenosed.  Dist LM to Ost LAD lesion is 60% stenosed.  Prox Cx to Mid Cx lesion is 50% stenosed.  Prox LAD to Mid LAD lesion is 70% stenosed.  Findings: Ao = 136/76 (97) LV = 139/28 RA = 9 RV = 65/10 PA = 67/29 (48) PCW = 36 Fick cardiac output/index = 4.1/2.2 PVR = 3.0 WU SVR = 1721 Assessment: 1. Severe 2v CAD with probable high grade, calcified distal left main lesion 2. EF 20-25% due to iCM 3. Elevated filling pressures with moderately reduced CO  Echo  EF 40-45% in 2015  EF 25-30% in 2016 EF 35-40% in 2017 EF 15-20% in 2020  Severe RV dysfunction.   ROS: All systems negative except as listed in HPI, PMH and Problem List.  SH:  Social History   Socioeconomic History  . Marital status: Married    Spouse name: Not on file  . Number of children: 3  . Years of education: Not on file  . Highest education level: Not on file  Occupational History  . Occupation: Retired  Tobacco Use  . Smoking status: Current Every Day Smoker    Packs/day: 0.25    Years: 20.00    Pack years: 5.00    Types: Cigarettes  . Smokeless tobacco: Never Used  . Tobacco comment: Quit June 2017, patient states stil smoking some 1 or 2 cigarettes per day.  Vaping Use  .  Vaping Use: Never used  Substance and Sexual Activity  . Alcohol use: Yes    Alcohol/week: 0.0 standard drinks    Comment: rarely  . Drug use: No  . Sexual activity: Not on file  Other Topics Concern  . Not on file  Social History Narrative  . Not on file   Social Determinants of Health   Financial Resource Strain:   . Difficulty of Paying Living Expenses: Not on file  Food Insecurity: No Food Insecurity  . Worried About Charity fundraiser in the Last Year: Never true  . Ran Out of Food in the Last Year: Never true  Transportation Needs: No Transportation Needs  . Lack of Transportation (Medical): No  . Lack of Transportation (Non-Medical): No  Physical Activity:    . Days of Exercise per Week: Not on file  . Minutes of Exercise per Session: Not on file  Stress:   . Feeling of Stress : Not on file  Social Connections:   . Frequency of Communication with Friends and Family: Not on file  . Frequency of Social Gatherings with Friends and Family: Not on file  . Attends Religious Services: Not on file  . Active Member of Clubs or Organizations: Not on file  . Attends Archivist Meetings: Not on file  . Marital Status: Not on file  Intimate Partner Violence:   . Fear of Current or Ex-Partner: Not on file  . Emotionally Abused: Not on file  . Physically Abused: Not on file  . Sexually Abused: Not on file    FH:  Family History  Problem Relation Age of Onset  . Diabetes Mellitus II Mother   . Heart disease Brother        before age 22  . Colon cancer Neg Hx   . Esophageal cancer Neg Hx   . Stomach cancer Neg Hx   . Anesthesia problems Neg Hx   . Hypotension Neg Hx   . Malignant hyperthermia Neg Hx   . Pseudochol deficiency Neg Hx     Past Medical History:  Diagnosis Date  . Allergy   . Atrial fibrillation ([place])   . Cataract   . CHF (congestive heart failure) ([place])   . Chronic kidney disease    Renal Insuffiecny  . Colon polyps 2012   Colonoscopy  . Diabetes mellitus    type 2  . Diverticulosis 2012   Colonoscopy   . Hyperglycemia 04/2019  . Hyperlipidemia   . Hypertension   . LBBB (left bundle branch block)   . Pneumonia 2015   3 times  . Stroke [facility]) 2015    Current Outpatient Medications  Medication Sig Dispense Refill  . aspirin EC 81 MG EC tablet Take 1 tablet (81 mg total) by mouth daily.    . clopidogrel (PLAVIX) 75 MG tablet Take 75 mg by mouth daily.    . insulin NPH-regular Human (70-30) 100 UNIT/ML injection Inject 15 Units into the skin 2 (two) times daily with a meal. With breakfast and dinner    . Insulin Pen Needle 32G X 4 MM MISC Use as directed daily at bedtime. 60 each 0  . iron polysaccharides  (FERREX 150) 150 MG capsule Take 1 capsule (150 mg total) by mouth daily. 30 capsule 0  . Multiple Vitamin (MULTIVITAMIN WITH MINERALS) TABS tablet Take 1 tablet by mouth daily.    . simvastatin (ZOCOR) 40 MG tablet Take 40 mg by mouth daily.     . sodium  polystyrene (SPS) 15 GM/60ML suspension Take 30 g by mouth daily.     Marland Kitchen torsemide (DEMADEX) 20 MG tablet TAKE 1 TABLET BY MOUTH EVERY OTHER DAY 45 tablet 2   No current facility-administered medications for this encounter.      Vitals:   07/29/20 1421  BP: (!) 96/54  Pulse: 76  SpO2: 96%  Weight: 74.4 kg (164 lb)   Wt Readings from Last 3 Encounters:  07/29/20 74.4 kg (164 lb)  04/12/20 76.2 kg (168 lb)  12/03/19 78.6 kg (173 lb 3.2 oz)   Orthostatics done personally  Sitting 96/54 Standing 76/50   PHYSICAL EXAM: General:  Thin elderly appearing. No resp difficulty HEENT: normal Neck: supple. no JVD. Carotids 2+ bilat; no bruits. No lymphadenopathy or thryomegaly appreciated. Cor: PMI nondisplaced. Regular rate & rhythm. No rubs, gallops or murmurs. Lungs: clear Abdomen: soft, nontender, nondistended. No hepatosplenomegaly. No bruits or masses. Good bowel sounds. Extremities: no cyanosis, clubbing, rash, edema Neuro: alert & orientedx3, cranial nerves grossly intact. moves all 4 extremities w/o difficulty. Affect pleasant   ASSESSMENT & PLAN:  1. Dizziness - suspect combination of hypoglycemia and orthostatic hypotension - insulin adjusted by PCP - BP dropped 20 points when I stood him up - Continue to hold carvedilol and Bidil - Decrease torsemide to 20mg  Monday and Friday only - Wear compression hose - Start midodrine 2.5 tid - Check PYP scan to exclude TTR amyloid  2. Chronic biventricular DVV:OHYWVPX with a history of non-ischemic cardiomyopathy with EF 25-30% in 2016, improved to 35-40% on echo in 2017.   - Echo  01/2019 revealed EF 15-20%, G3DD, and severe RV hypokinesis.  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 LBBB  may also be playing a role - Echo 11/20 EF 20-25% RV ok   - Echo 04/12/20 EF  ~35-40%  - Stable NYHA III. Volume status low - Decrease torsemide to 20mg  Monday and Friday only - Carvedilol and Bidil on hold due to low BP - Off spironolactone due to CKD (Last creatinine 2.9) - No SGLT2i at this point with orthostasis - LBBB may also be playing a role (though it is atypical) - He saw Dr. Caryl Comes in 12/20 and felt not to be candidate for ICD and felt he would not benefit from CRT either. - Labs today  2. CAD: - Cath 02/07/19 with likely high-grade distal LM lesion.  - Case has been reviewed with Dr. Prescott Gum who has reviewed images. LAD only graftable vessel and given severe RV dysfunction, advanced age, fraility and CKD not felt to be CABG candidate. Case reviewed with Dr. Burt Knack with regard to possibility of LM stenting. It is highly calcified lesion and with severely reduced EF would need probable Impella support but PAD likely precludes. Additionally, LV was down prior to CAD so may not improve with stenting. I suspect medical management may be best.  - Continues to do well with medical therapy. No s/s angina  - On ASA/Plavix. No bleeding  - Continue statin. Goal LDL < 70  3.CKD3b-IV: - Creatinine baseline 1.9-2.1,  - He now follows with [name].  Most recent creatinine 2.9 (in 12/03/19) - Recheck labs today    4. History of CVA: - He is on ASA 81, Plavix, and statin.  - No recent symptoms   5. DM type 2, poorly controlled: - Per primary. Hgba1c 15 in 8/20 - Insulin decreased as above  Total time spent 45 minutes. Over half that time spent discussing above.    [name]  Peter Draheim  MD 2:47 PM

## 2020-07-30 DIAGNOSIS — E1122 Type 2 diabetes mellitus with diabetic chronic kidney disease: Secondary | ICD-10-CM | POA: Diagnosis not present

## 2020-08-04 DIAGNOSIS — R69 Illness, unspecified: Secondary | ICD-10-CM | POA: Diagnosis not present

## 2020-08-06 ENCOUNTER — Other Ambulatory Visit: Payer: Self-pay

## 2020-08-06 ENCOUNTER — Encounter (HOSPITAL_COMMUNITY)
Admission: RE | Admit: 2020-08-06 | Discharge: 2020-08-06 | Disposition: A | Discharge status: Home / Self Care | Payer: Medicare HMO | Source: Ambulatory Visit | Attending: Internal Medicine | Admitting: Internal Medicine

## 2020-08-06 DIAGNOSIS — I5022 Chronic systolic (congestive) heart failure: Secondary | ICD-10-CM | POA: Diagnosis present

## 2020-08-06 DIAGNOSIS — I509 Heart failure, unspecified: Secondary | ICD-10-CM | POA: Diagnosis not present

## 2020-08-06 MED ORDER — TECHNETIUM TC 99M PYROPHOSPHATE
21.6000 | Freq: Once | INTRAVENOUS | Status: AC | PRN
  Administered 2020-08-06: 21.6 via INTRAVENOUS
  Filled 2020-08-06: qty 22

## 2020-08-09 DIAGNOSIS — E1122 Type 2 diabetes mellitus with diabetic chronic kidney disease: Secondary | ICD-10-CM | POA: Diagnosis not present

## 2020-08-24 ENCOUNTER — Other Ambulatory Visit: Payer: Self-pay | Admitting: *Deleted

## 2020-08-24 ENCOUNTER — Encounter: Payer: Self-pay | Admitting: *Deleted

## 2020-08-24 NOTE — Patient Instructions (Signed)
Goals Addressed            This Visit's Progress   . [facility] CM: Make and Keep All Appointments   On track    Follow Up Date 09/21/2020   - ask family or friend for a ride - keep a calendar with appointment dates    Why is this important?   Part of staying healthy is seeing the doctor for follow-up care.  If you forget your appointments, there are some things you can do to stay on track.    Notes:   07/27/20 -- reviewed recent and upcoming provider appointments with spouse- confirmed she will continue to provide transportation and has plans to attend all scheduled appointments -- placed care coordination outreach to PCP to initiate triage/ possible urgent appointment to address patient's nonspecific symptoms of weakness with request to address insulin management  08/24/20: -- reviewed recent and upcoming provider appointments with caregiver and confirmed that patient has stable reliable transportation and still drives self; wife able to assist if/ as needed    . [facility] CM: Manage My Medicine   On track    Follow Up Date 09/21/2020   - call if I am sick and can't take my medicine - keep a list of all the medicines I take; vitamins and herbals too    Why is this important?   These steps will help you keep on track with your medicines.    Notes:   07/27/20: -- discussed basic sick day rules with caregiver and placed care coordination to PCP to initiate triage follow up for nonspecific symptoms of weakness reported today by caregiver  08/24/20: -- medication review completed with caregiver today; no concerns or discrepancies identified; confirmed patient's caregiver/ spouse continues to manage patient's medications    . THN CM: Monitor and Manage My Blood Sugar   On track    Follow Up Date 09/21/20   - check blood sugar at prescribed times - check blood sugar if I feel it is too high or too low - enter blood sugar readings and medication or insulin into daily log - take the blood  sugar log to all doctor visits - take the blood sugar meter to all doctor visits    Why is this important?   Checking your blood sugar at home helps to keep it from getting very high or very low.  Writing the results in a diary or log helps the doctor know how to care for you.  Your blood sugar log should have the time, date and the results.  Also, write down the amount of insulin or other medicine that you take.  Other information, like what you ate, exercise done and how you were feeling, will also be helpful.     Notes:   07/27/20: -- reviewed with caregiver patients recent blood sugar values at home  08/24/20: -- reviewed with caregiver recent blood sugars at home -- confirmed patient's previously reported clinical symptoms and low blood sugar episodes have resolved with recent changes to insulin dosing -- confirmed that patient continues following diabetic diet-- previously provided education reinforced       Diabetes Mellitus and Nutrition, Adult When you have diabetes (diabetes mellitus), it is very important to have healthy eating habits because your blood sugar (glucose) levels are greatly affected by what you eat and drink. Eating healthy foods in the appropriate amounts, at about the same times every day, can help you:  Control your blood glucose.  Lower your risk  of heart disease.  Improve your blood pressure.  Reach or maintain a healthy weight. Every person with diabetes is different, and each person has different needs for a meal plan. Your health care provider may recommend that you work with a diet and nutrition specialist (dietitian) to make a meal plan that is best for you. Your meal plan may vary depending on factors such as:  The calories you need.  The medicines you take.  Your weight.  Your blood glucose, blood pressure, and cholesterol levels.  Your activity level.  Other health conditions you have, such as heart or kidney disease. How do  carbohydrates affect me? Carbohydrates, also called carbs, affect your blood glucose level more than any other type of food. Eating carbs naturally raises the amount of glucose in your blood. Carb counting is a method for keeping track of how many carbs you eat. Counting carbs is important to keep your blood glucose at a healthy level, especially if you use insulin or take certain oral diabetes medicines. It is important to know how many carbs you can safely have in each meal. This is different for every person. Your dietitian can help you calculate how many carbs you should have at each meal and for each snack. Foods that contain carbs include:  Bread, cereal, rice, pasta, and crackers.  Potatoes and corn.  Peas, beans, and lentils.  Milk and yogurt.  Fruit and juice.  Desserts, such as cakes, cookies, ice cream, and candy. How does alcohol affect me? Alcohol can cause a sudden decrease in blood glucose (hypoglycemia), especially if you use insulin or take certain oral diabetes medicines. Hypoglycemia can be a life-threatening condition. Symptoms of hypoglycemia (sleepiness, dizziness, and confusion) are similar to symptoms of having too much alcohol. If your health care provider says that alcohol is safe for you, follow these guidelines:  Limit alcohol intake to no more than 1 drink per day for nonpregnant women and 2 drinks per day for men. One drink equals 12 oz of beer, 5 oz of wine, or 1 oz of hard liquor.  Do not drink on an empty stomach.  Keep yourself hydrated with water, diet soda, or unsweetened iced tea.  Keep in mind that regular soda, juice, and other mixers may contain a lot of sugar and must be counted as carbs. What are tips for following this plan?  Reading food labels  Start by checking the serving size on the "Nutrition Facts" label of packaged foods and drinks. The amount of calories, carbs, fats, and other nutrients listed on the label is based on one serving of  the item. Many items contain more than one serving per package.  Check the total grams (g) of carbs in one serving. You can calculate the number of servings of carbs in one serving by dividing the total carbs by 15. For example, if a food has 30 g of total carbs, it would be equal to 2 servings of carbs.  Check the number of grams (g) of saturated and trans fats in one serving. Choose foods that have low or no amount of these fats.  Check the number of milligrams (mg) of salt (sodium) in one serving. Most people should limit total sodium intake to less than 2,300 mg per day.  Always check the nutrition information of foods labeled as "low-fat" or "nonfat". These foods may be higher in added sugar or refined carbs and should be avoided.  Talk to your dietitian to identify your daily goals  for nutrients listed on the label. Shopping  Avoid buying canned, premade, or processed foods. These foods tend to be high in fat, sodium, and added sugar.  Shop around the outside edge of the grocery store. This includes fresh fruits and vegetables, bulk grains, fresh meats, and fresh dairy. Cooking  Use low-heat cooking methods, such as baking, instead of high-heat cooking methods like deep frying.  Cook using healthy oils, such as olive, canola, or sunflower oil.  Avoid cooking with butter, cream, or high-fat meats. Meal planning  Eat meals and snacks regularly, preferably at the same times every day. Avoid going long periods of time without eating.  Eat foods high in fiber, such as fresh fruits, vegetables, beans, and whole grains. Talk to your dietitian about how many servings of carbs you can eat at each meal.  Eat 4-6 ounces (oz) of lean protein each day, such as lean meat, chicken, fish, eggs, or tofu. One oz of lean protein is equal to: ? 1 oz of meat, chicken, or fish. ? 1 egg. ?  cup of tofu.  Eat some foods each day that contain healthy fats, such as avocado, nuts, seeds, and  fish. Lifestyle  Check your blood glucose regularly.  Exercise regularly as told by your health care provider. This may include: ? 150 minutes of moderate-intensity or vigorous-intensity exercise each week. This could be brisk walking, biking, or water aerobics. ? Stretching and doing strength exercises, such as yoga or weightlifting, at least 2 times a week.  Take medicines as told by your health care provider.  Do not use any products that contain nicotine or tobacco, such as cigarettes and e-cigarettes. If you need help quitting, ask your health care provider.  Work with a Social worker or diabetes educator to identify strategies to manage stress and any emotional and social challenges. Questions to ask a health care provider  Do I need to meet with a diabetes educator?  Do I need to meet with a dietitian?  What number can I call if I have questions?  When are the best times to check my blood glucose? Where to find more information:  American Diabetes Association: diabetes.org  Academy of Nutrition and Dietetics: www.eatright.[employer] of Diabetes and Digestive and Kidney Diseases (NIH): [employer] Summary  A healthy meal plan will help you control your blood glucose and maintain a healthy lifestyle.  Working with a diet and nutrition specialist (dietitian) can help you make a meal plan that is best for you.  Keep in mind that carbohydrates (carbs) and alcohol have immediate effects on your blood glucose levels. It is important to count carbs and to use alcohol carefully. This information is not intended to replace advice given to you by your health care provider. Make sure you discuss any questions you have with your health care provider. Document Revised: 08/24/2017 Document Reviewed: 10/16/2016 Elsevier Patient Education  2020 [employer].

## 2020-08-24 NOTE — Patient Outreach (Signed)
[facility]) Care Management [place] Telephone Outreach  08/24/2020  [name] March 12, 1941 [id number]  Successful telephone outreach to [name], spouse/ caregiver, on [facility] DPR for patient [name], referred to this RN CM by [name] leadership 05/06/20 for assumption of care from previous [facility] RN CM. Patient has had no recent ED or inpatient hospital admissions; patient has history including, but not limited to, DM with A1-C of 15 (05/21/2019); CHF/ CAD; HTN/ HLD; CKD-III; and previous CVA.  Patient's most recently reported A1-C 9.1 (05/21/20)  HIPAA/ identity verified; spouse reports today that patient is doing much better than last conversation when she reported generalized weakness and episodes of hypoglycemia; confirms that she was able to schedule appointment with patient's PCP and that his insulin was adjusted which "took care of everything;" medication review completed with patient's caregiver and no discrepancies or concerns were identified.  Reviewed all recent and upcoming provider appointments and confirmed that patient and caregiver both continue driving and there are no transportation concerns.  Reviewed blood sugars at home with caregiver, who reports fasting ranges between 118-160 and post-prandial ranges between 180-200's; caregiver reports patient continues following diabetes diet "as much as possible;" previously provided education reinforced with caregiver today.  Caregiver denies further issues, concerns, or problems today. I confirmed that caregiver hasmy direct phone number, the main [facility] CM office phone number, and the [facility] CM 24-hour nurse advice phone number should issues arise prior to next scheduled [facility] CM outreach.  Encouraged patient to contact me directly if needs, questions, issues, or concerns arise prior to next scheduled outreach; patient agreed to do so.  Plan:  Patient will take medications as prescribed and will attend all scheduled  provider appointments  Patient will promptly notify care providers for any new concerns/ issues/ problems that arise  Patient will continue monitoring/ recording blood sugars 2-3 times per day  [facility] CM outreach to continue with scheduled phone call next month, sooner if indicated  Oneta Rack, RN, BSN, [employer] Coordinator [facility] Care Management  [phone]

## 2020-09-01 DIAGNOSIS — E785 Hyperlipidemia, unspecified: Secondary | ICD-10-CM | POA: Diagnosis not present

## 2020-09-01 DIAGNOSIS — J449 Chronic obstructive pulmonary disease, unspecified: Secondary | ICD-10-CM | POA: Diagnosis not present

## 2020-09-01 DIAGNOSIS — H3500 Unspecified background retinopathy: Secondary | ICD-10-CM | POA: Diagnosis not present

## 2020-09-01 DIAGNOSIS — N183 Chronic kidney disease, stage 3 unspecified: Secondary | ICD-10-CM | POA: Diagnosis not present

## 2020-09-01 DIAGNOSIS — E1122 Type 2 diabetes mellitus with diabetic chronic kidney disease: Secondary | ICD-10-CM | POA: Diagnosis not present

## 2020-09-01 DIAGNOSIS — N189 Chronic kidney disease, unspecified: Secondary | ICD-10-CM | POA: Diagnosis not present

## 2020-09-01 DIAGNOSIS — E875 Hyperkalemia: Secondary | ICD-10-CM | POA: Diagnosis not present

## 2020-09-01 DIAGNOSIS — I509 Heart failure, unspecified: Secondary | ICD-10-CM | POA: Diagnosis not present

## 2020-09-01 DIAGNOSIS — N2581 Secondary hyperparathyroidism of renal origin: Secondary | ICD-10-CM | POA: Diagnosis not present

## 2020-09-01 DIAGNOSIS — I2510 Atherosclerotic heart disease of native coronary artery without angina pectoris: Secondary | ICD-10-CM | POA: Diagnosis not present

## 2020-09-01 DIAGNOSIS — D631 Anemia in chronic kidney disease: Secondary | ICD-10-CM | POA: Diagnosis not present

## 2020-09-11 DIAGNOSIS — Z20822 Contact with and (suspected) exposure to covid-19: Secondary | ICD-10-CM | POA: Diagnosis not present

## 2020-09-21 ENCOUNTER — Encounter: Payer: Self-pay | Admitting: *Deleted

## 2020-09-21 ENCOUNTER — Other Ambulatory Visit: Payer: Self-pay | Admitting: *Deleted

## 2020-09-21 NOTE — Patient Outreach (Signed)
[facility]) Care Management [facility] CM Telephone Outreach Unsuccessful outreach attempt # 1- previously engaged patient/ caregiver  09/21/2020  [name] 1941-04-03 [id number]  Unsuccessful telephone outreach toElaine Stann Mainland, spouse/ caregiver, on [facility] DPR for patient [name], referred to this RN CM by [place] leadership 05/06/20 for assumption of care from previous [facility] RN CM. Patient has had no recent ED or inpatient hospital admissions; patient has history including, but not limited to, DM with A1-C of 15 (05/21/2019); CHF/ CAD; HTN/ HLD; CKD-III; and previous CVA.  Patient's most recently reported A1-C 9.1 (05/21/20)  HIPAA compliant voice mail message left for patient, requesting return call back.  Plan:  Will re-attempt [facility] CM telephone outreach within 4 business days if I do not hear back from patient/ caregiver first  Oneta Rack, RN, BSN, [place] Care Management  [phone]

## 2020-09-28 ENCOUNTER — Encounter: Payer: Self-pay | Admitting: *Deleted

## 2020-09-28 ENCOUNTER — Other Ambulatory Visit: Payer: Self-pay | Admitting: *Deleted

## 2020-09-28 NOTE — Patient Outreach (Signed)
[facility]) Care Management [facility] CM Telephone Outreach Unsuccessful outreach attempt # 2- previously engaged patient/ caregiver  09/28/2020  Rhyder Koegel [birth year] [id number]  10:20 am: Unsuccessful (consecutive) second telephone outreach attempt toElaine Stann Mainland, spouse/ caregiver, on [place] for patient Saladin Petrelli, referred to this RN CM by [place] leadership 05/06/20 for assumption of care from previous [facility] RN CM. Patient has had no recent ED or inpatient hospital admissions; patient has history including, but not limited to, DM with A1-C of 15 (05/21/2019); CHF/ CAD; HTN/ HLD; CKD-III; and previous CVA.  Patient's most recently reported A1-C 9.1 (05/21/20)  HIPAA compliant voice mail message left for patient, requesting return call back.  Plan:  Will place [facility] CM unsuccessful patient outreach letter in mail requesting call back in writing  Will re-attempt [facility] CM telephone outreach within 4 business days if I do not hear back from patient/ caregiver first  Oneta Rack, RN, BSN, [employer] Coordinator [facility] Care Management  [phone]

## 2020-10-01 ENCOUNTER — Ambulatory Visit: Payer: Self-pay | Admitting: *Deleted

## 2020-10-04 ENCOUNTER — Other Ambulatory Visit: Payer: Self-pay | Admitting: *Deleted

## 2020-10-04 ENCOUNTER — Ambulatory Visit: Payer: Self-pay | Admitting: *Deleted

## 2020-10-04 NOTE — Patient Outreach (Signed)
[facility]) Care Management [facility] CM Telephone Outreach Unsuccessful (consecutive) outreach attempt # 3- previously engaged caregiver [name] Coordination  10/04/2020  [name] 01-13-1941 [id number]  12:15: Unsuccessful consecutive third telephone outreach attempt toElaine Stann [name], spouse/ caregiver, on [place] for patient [name], referred to this RN CM by [place] leadership 05/06/20 for assumption of care from previous [facility] RN CM. Patient has had no recent ED or inpatient hospital admissions; patient has history including, but not limited to, DM with A1-C of 15 (05/21/2019); CHF/ CAD; HTN/ HLD; CKD-III; and previous CVA.  Patient's most recently reported A1-C 9.1 (05/21/20)- verified by PCP office team.  HIPAA compliant voice mail message left for patient, requesting return call back.  [place] Coordination outreach placed to patient's PCP office to confirm next scheduled office visit:  Spoke with Lesly Rubenstein, who confirms patient is scheduled for lab office visit 10/19/20 and office visit for physical on 10/26/20  Plan:  Verified [facility] CM unsuccessful patient outreach letter placed in mail 09/28/20 requesting call back in writing  Will re-attempt [facility] CM telephone outreach within 4 weeks for final outreach attempt, if I do not hear back from patient/ caregiver first  Oneta Rack, RN, BSN, [employer] Coordinator [facility] Care Management  [phone]

## 2020-10-07 ENCOUNTER — Telehealth (HOSPITAL_COMMUNITY): Payer: Self-pay | Admitting: Vascular Surgery

## 2020-10-07 NOTE — Telephone Encounter (Signed)
Left pt message, to call back to resch 10/11/20 appt w/ db due to inclement weather

## 2020-10-11 ENCOUNTER — Encounter (HOSPITAL_COMMUNITY): Payer: Medicare HMO | Admitting: Internal Medicine

## 2020-10-19 DIAGNOSIS — E1122 Type 2 diabetes mellitus with diabetic chronic kidney disease: Secondary | ICD-10-CM | POA: Diagnosis not present

## 2020-10-19 DIAGNOSIS — E785 Hyperlipidemia, unspecified: Secondary | ICD-10-CM | POA: Diagnosis not present

## 2020-10-19 DIAGNOSIS — Z125 Encounter for screening for malignant neoplasm of prostate: Secondary | ICD-10-CM | POA: Diagnosis not present

## 2020-10-22 DIAGNOSIS — Z7689 Persons encountering health services in other specified circumstances: Secondary | ICD-10-CM | POA: Diagnosis not present

## 2020-10-26 DIAGNOSIS — R5383 Other fatigue: Secondary | ICD-10-CM | POA: Diagnosis not present

## 2020-10-26 DIAGNOSIS — H3093 Unspecified chorioretinal inflammation, bilateral: Secondary | ICD-10-CM | POA: Diagnosis not present

## 2020-10-26 DIAGNOSIS — Z0000 Encounter for general adult medical examination without abnormal findings: Secondary | ICD-10-CM | POA: Diagnosis not present

## 2020-10-26 DIAGNOSIS — R059 Cough, unspecified: Secondary | ICD-10-CM | POA: Diagnosis not present

## 2020-10-26 DIAGNOSIS — E1122 Type 2 diabetes mellitus with diabetic chronic kidney disease: Secondary | ICD-10-CM | POA: Diagnosis not present

## 2020-10-26 DIAGNOSIS — I5020 Unspecified systolic (congestive) heart failure: Secondary | ICD-10-CM | POA: Diagnosis not present

## 2020-10-26 DIAGNOSIS — I130 Hypertensive heart and chronic kidney disease with heart failure and stage 1 through stage 4 chronic kidney disease, or unspecified chronic kidney disease: Secondary | ICD-10-CM | POA: Diagnosis not present

## 2020-10-26 DIAGNOSIS — N184 Chronic kidney disease, stage 4 (severe): Secondary | ICD-10-CM | POA: Diagnosis not present

## 2020-10-26 DIAGNOSIS — I69959 Hemiplegia and hemiparesis following unspecified cerebrovascular disease affecting unspecified side: Secondary | ICD-10-CM | POA: Diagnosis not present

## 2020-10-26 DIAGNOSIS — I739 Peripheral vascular disease, unspecified: Secondary | ICD-10-CM | POA: Diagnosis not present

## 2020-11-01 ENCOUNTER — Other Ambulatory Visit: Payer: Self-pay | Admitting: *Deleted

## 2020-11-01 ENCOUNTER — Encounter: Payer: Self-pay | Admitting: *Deleted

## 2020-11-01 DIAGNOSIS — Z89411 Acquired absence of right great toe: Secondary | ICD-10-CM | POA: Diagnosis not present

## 2020-11-01 DIAGNOSIS — J449 Chronic obstructive pulmonary disease, unspecified: Secondary | ICD-10-CM | POA: Diagnosis not present

## 2020-11-01 DIAGNOSIS — Z008 Encounter for other general examination: Secondary | ICD-10-CM | POA: Diagnosis not present

## 2020-11-01 DIAGNOSIS — E1165 Type 2 diabetes mellitus with hyperglycemia: Secondary | ICD-10-CM | POA: Diagnosis not present

## 2020-11-01 DIAGNOSIS — Z794 Long term (current) use of insulin: Secondary | ICD-10-CM | POA: Diagnosis not present

## 2020-11-01 DIAGNOSIS — E1140 Type 2 diabetes mellitus with diabetic neuropathy, unspecified: Secondary | ICD-10-CM | POA: Diagnosis not present

## 2020-11-01 DIAGNOSIS — I69354 Hemiplegia and hemiparesis following cerebral infarction affecting left non-dominant side: Secondary | ICD-10-CM | POA: Diagnosis not present

## 2020-11-01 DIAGNOSIS — I509 Heart failure, unspecified: Secondary | ICD-10-CM | POA: Diagnosis not present

## 2020-11-01 DIAGNOSIS — E1151 Type 2 diabetes mellitus with diabetic peripheral angiopathy without gangrene: Secondary | ICD-10-CM | POA: Diagnosis not present

## 2020-11-01 DIAGNOSIS — E261 Secondary hyperaldosteronism: Secondary | ICD-10-CM | POA: Diagnosis not present

## 2020-11-01 DIAGNOSIS — Z89421 Acquired absence of other right toe(s): Secondary | ICD-10-CM | POA: Diagnosis not present

## 2020-11-01 NOTE — Patient Outreach (Signed)
[facility]) Care Management  11/01/2020  [name] 1941/07/02 [id number]  Assessment Telephone-Successful-Diabetes  RN spoke with pt's spouse [name] who provider today's update on pt's ongoing management of care. Reports recent change in pt's insulin (now SSI) and last A1c 10/25/09 from a 9. Reiterated on the importance of controlling all glucose levels thorough dietary habits and exercise if tolerable. Plan of care reviewed and discussed to noted progress and changes.   Wife very supportive and indicated [place] and this may have affected his reading. Much discussed on managing this condition to prevent acute issues from occurring. Plan of care noted and RN will follow up next month with ongoing case management needs.  Goals Addressed            This Visit's Progress   . [facility] CM: Make and Keep All Appointments   On track    Follow Up Date 11/29/2020 Timeframe:  Short-Term Goal Priority:  Medium Start Date:    11/01/2020                         Expected End Date:   12/23/2020                      - ask family or friend for a ride - keep a calendar with prescription refill dates - keep a calendar with appointment dates    Why is this important?   Part of staying healthy is seeing the doctor for follow-up care.  If you forget your appointments, there are some things you can do to stay on track.    Notes:  Feb-Spouse verified pt continues to have sufficient transportation to all medical appointments. Review all medication and verified refills. Pt taking accordingly to prescription orders. Will continue to encouraged adherence and re-evaluate next month. Pt has been out of the county and recently returned.  07/27/20 -- reviewed recent and upcoming provider appointments with spouse- confirmed she will continue to provide transportation and has plans to attend all scheduled appointments -- placed care coordination outreach to PCP to initiate  triage/ possible urgent appointment to address patient's nonspecific symptoms of weakness with request to address insulin management  08/24/20: -- reviewed recent and upcoming provider appointments with caregiver and confirmed that patient has stable reliable transportation and still drives self; wife able to assist if/ as needed    . [facility] CM: Manage My Medicine   On track    Follow Up Date 11/29/2020 Timeframe:  Short-Term Goal Priority:  Medium Start Date:   11/01/2020                          Expected End Date: 12/23/2020                   - call for medicine refill 2 or 3 days before it runs out - call if I am sick and can't take my medicine - keep a list of all the medicines I take; vitamins and herbals too    Why is this important?   These steps will help you keep on track with your medicines.    Notes:  Feb-Verified sufficient refills on all medications and pt has enough supplies. Will reiterated on the above goal and interventions related for ongoing prevention measures. Will reassess next month on pt's progress.  07/27/20: -- discussed basic sick day rules with  caregiver and placed care coordination to PCP to initiate triage follow up for nonspecific symptoms of weakness reported today by caregiver  08/24/20: -- medication review completed with caregiver today; no concerns or discrepancies identified; confirmed patient's caregiver/ spouse continues to manage patient's medications    . [facility] CM: Monitor and Manage My Blood Sugar   On track    Follow Up Date  11/01/2020 Timeframe:  Short-Term Goal Priority:  Medium Start Date:    11/01/2020                         Expected End Date:   12/23/2020                    - check blood sugar at prescribed times - check blood sugar if I feel it is too high or too low - enter blood sugar readings and medication or insulin into daily log - take the blood sugar log to all doctor visits - take the blood sugar meter to all doctor visits    Why  is this important?   Checking your blood sugar at home helps to keep it from getting very high or very low.  Writing the results in a diary or log helps the doctor know how to care for you.  Your blood sugar log should have the time, date and the results.  Also, write down the amount of insulin or other medicine that you take.  Other information, like what you ate, exercise done and how you were feeling, will also be helpful.     Notes:  Feb-Will verified readings with ranges from 65-79 and recent change in medication not with a SSI on the same insulin medication. Will continue to encourage ongoing documentation of all readings for pt's provider to view his daily glucose reads.Further discussed dietary habits that can affect his overall A1c readings over the course of 3 months even with good daily glucose reads. Will continue to educate on high protein and low carbohydrates dietary habits..  07/27/20: -- reviewed with caregiver patients recent blood sugar values at home  08/24/20: -- reviewed with caregiver recent blood sugars at home -- confirmed patient's previously reported clinical symptoms and low blood sugar episodes have resolved with recent changes to insulin dosing -- confirmed that patient continues following diabetic diet-- previously provided education reinforced       [name], RN Care Management Coordinator [place] Office [phone]

## 2020-11-03 ENCOUNTER — Ambulatory Visit (HOSPITAL_COMMUNITY)
Admission: RE | Admit: 2020-11-03 | Discharge: 2020-11-03 | Disposition: A | Discharge status: Home / Self Care | Payer: Medicare HMO | Source: Ambulatory Visit | Attending: Internal Medicine | Admitting: Internal Medicine

## 2020-11-03 ENCOUNTER — Other Ambulatory Visit: Payer: Self-pay

## 2020-11-03 ENCOUNTER — Encounter (HOSPITAL_COMMUNITY): Payer: Self-pay | Admitting: Internal Medicine

## 2020-11-03 VITALS — BP 122/80 | HR 85 | Wt 164.2 lb

## 2020-11-03 DIAGNOSIS — E785 Hyperlipidemia, unspecified: Secondary | ICD-10-CM | POA: Insufficient documentation

## 2020-11-03 DIAGNOSIS — Z7982 Long term (current) use of aspirin: Secondary | ICD-10-CM | POA: Insufficient documentation

## 2020-11-03 DIAGNOSIS — Z8673 Personal history of transient ischemic attack (TIA), and cerebral infarction without residual deficits: Secondary | ICD-10-CM | POA: Insufficient documentation

## 2020-11-03 DIAGNOSIS — N184 Chronic kidney disease, stage 4 (severe): Secondary | ICD-10-CM

## 2020-11-03 DIAGNOSIS — Z7902 Long term (current) use of antithrombotics/antiplatelets: Secondary | ICD-10-CM | POA: Diagnosis not present

## 2020-11-03 DIAGNOSIS — Z8249 Family history of ischemic heart disease and other diseases of the circulatory system: Secondary | ICD-10-CM | POA: Insufficient documentation

## 2020-11-03 DIAGNOSIS — F17210 Nicotine dependence, cigarettes, uncomplicated: Secondary | ICD-10-CM | POA: Diagnosis not present

## 2020-11-03 DIAGNOSIS — I5022 Chronic systolic (congestive) heart failure: Secondary | ICD-10-CM | POA: Insufficient documentation

## 2020-11-03 DIAGNOSIS — I130 Hypertensive heart and chronic kidney disease with heart failure and stage 1 through stage 4 chronic kidney disease, or unspecified chronic kidney disease: Secondary | ICD-10-CM | POA: Diagnosis not present

## 2020-11-03 DIAGNOSIS — E1122 Type 2 diabetes mellitus with diabetic chronic kidney disease: Secondary | ICD-10-CM | POA: Insufficient documentation

## 2020-11-03 DIAGNOSIS — I447 Left bundle-branch block, unspecified: Secondary | ICD-10-CM | POA: Diagnosis not present

## 2020-11-03 DIAGNOSIS — R42 Dizziness and giddiness: Secondary | ICD-10-CM | POA: Diagnosis not present

## 2020-11-03 DIAGNOSIS — I2510 Atherosclerotic heart disease of native coronary artery without angina pectoris: Secondary | ICD-10-CM | POA: Insufficient documentation

## 2020-11-03 DIAGNOSIS — Z79899 Other long term (current) drug therapy: Secondary | ICD-10-CM | POA: Insufficient documentation

## 2020-11-03 DIAGNOSIS — E1165 Type 2 diabetes mellitus with hyperglycemia: Secondary | ICD-10-CM | POA: Diagnosis not present

## 2020-11-03 DIAGNOSIS — I5082 Biventricular heart failure: Secondary | ICD-10-CM | POA: Diagnosis not present

## 2020-11-03 DIAGNOSIS — Z794 Long term (current) use of insulin: Secondary | ICD-10-CM | POA: Diagnosis not present

## 2020-11-03 DIAGNOSIS — N1832 Chronic kidney disease, stage 3b: Secondary | ICD-10-CM | POA: Insufficient documentation

## 2020-11-03 LAB — BASIC METABOLIC PANEL
Anion gap: 10 (ref 5–15)
BUN: 27 mg/dL — ABNORMAL HIGH (ref 8–23)
CO2: 26 mmol/L (ref 22–32)
Calcium: 9.1 mg/dL (ref 8.9–10.3)
Chloride: 103 mmol/L (ref 98–111)
Creatinine, Ser: 2.24 mg/dL — ABNORMAL HIGH (ref 0.61–1.24)
GFR, Estimated: 29 mL/min — ABNORMAL LOW (ref >60)
Glucose, Bld: 195 mg/dL — ABNORMAL HIGH (ref 70–99)
Potassium: 5.1 mmol/L (ref 3.5–5.1)
Sodium: 139 mmol/L (ref 135–145)

## 2020-11-03 LAB — BRAIN NATRIURETIC PEPTIDE: B Natriuretic Peptide: 1049.6 pg/mL — ABNORMAL HIGH (ref 0.0–100.0)

## 2020-11-03 NOTE — Patient Instructions (Signed)
Labs done today, your results will be available in MyChart, we will contact you for abnormal readings.  Your physician recommends that you schedule a follow-up appointment in: 4 months with echocardiogram  Your physician has requested that you have an echocardiogram. Echocardiography is a painless test that uses sound waves to create images of your heart. It provides your doctor with information about the size and shape of your heart and how well your heart's chambers and valves are working. This procedure takes approximately one hour. There are no restrictions for this procedure.  If you have any questions or concerns before your next appointment please send [place] a message through [place] or call our office at [phone].    TO LEAVE A MESSAGE FOR THE NURSE SELECT OPTION 2, PLEASE LEAVE A MESSAGE INCLUDING: . YOUR NAME . DATE OF BIRTH . CALL BACK NUMBER . REASON FOR CALL**this is important as we prioritize the call backs  [place] AS LONG AS YOU CALL BEFORE 4:00 PM  At the [facility], you and your health needs are our priority. As part of our continuing mission to provide you with exceptional heart care, we have created designated Provider Care Teams. These Care Teams include your primary Cardiologist (physician) and Advanced Practice Providers (APPs- Physician Assistants and Nurse Practitioners) who all work together to provide you with the care you need, when you need it.   You may see any of the following providers on your designated Care Team at your next follow up: Marland Kitchen Dr Glori Bickers . Dr Loralie Champagne . Darrick Grinder, NP . Lyda Jester, [place] . Audry Riles, PharmD   Please be sure to bring in all your medications bottles to every appointment.

## 2020-11-03 NOTE — Progress Notes (Signed)
ADVANCED HF CLINIC NOTE  PCP: Dr Dagmar Hait  Primary HF Cardiologist: Dr Haroldine Laws   HPI: [name] is a 80 y.o. 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 Echo EF 15-20% with severe RV dysfunction. Had RHC/LHC with volume overlaod and high grade disttal LM lesion. CT surgery consulted. He was not thought to be surgery candidate given severe RV dysfunction, advanced age, fraility and CKD. Case reviewed with Dr. Burt Knack with regard to possibility of LM stenting. It is highly calcified lesion and with severely reduced EF would need probable Impella support but PAD likely precludes. Additionally, LV was down prior to CAD so may not improve with stenting -> medical management recommended. Improved markedly with diuresis. D/c weight 146   Readmitted to [facility] in 8/20 with poorly controlled DM2 (HgBa1c 15%). Treated with IV insulin and IVF.  Today he returns for HF follow up with his wife. Here for routine f/u. At last visit was very dizzy but th   Says over the past 1-2 months has been much more dizzy. Dizziness is much worse in the am and before he eats. Carvedilol and Bidil stopped by [name] (Nephrology). I decreased the torsemide to 2x/week (Monday and Friday) at last visit as SBP dropped 96 -> 76 with standing. Also added midodrine 2.5 tid  Dizziness much improved. Still gets dizzy 1-2x/week when he stands up. SBP 130-140 at home.  No CP, edema, orthopnea or PND. Still smoking   PYP 11/21 negative   Echo  04/12/20 EF 35-40%    He saw Dr. Caryl Comes in 12/20 and felt not to be candidate for ICD and felt he would not benefit from CRT either.   RHC/LHC 02/07/2019   Mid RCA to Dist RCA lesion is 50%  stenosed.  Prox RCA lesion is 40% stenosed.  Mid LM to Dist LM lesion is 80% stenosed.  RPDA lesion is 90% stenosed.  Ost Cx to Prox Cx lesion is 80% stenosed.  Dist LM to Ost LAD lesion is 60% stenosed.  Prox Cx to Mid Cx lesion is 50% stenosed.  Prox LAD to Mid LAD lesion is 70% stenosed.  Findings: Ao = 136/76 (97) LV = 139/28 RA = 9 RV = 65/10 PA = 67/29 (48) PCW = 36 Fick cardiac output/index = 4.1/2.2 PVR = 3.0 WU SVR = 1721 Assessment: 1. Severe 2v CAD with probable high grade, calcified distal left main lesion 2. EF 20-25% due to iCM 3. Elevated filling pressures with moderately reduced CO  Echo  EF 40-45% in 2015  EF 25-30% in 2016 EF 35-40% in 2017 EF 15-20% in 2020  Severe RV dysfunction.   ROS: All systems negative except as listed in HPI, PMH and Problem List.  SH:  Social History   Socioeconomic History  . Marital status: Married    Spouse name: Not on file  . Number of children: 3  . Years of education: Not on file  . Highest education level: Not on file  Occupational History  . Occupation: Retired  Tobacco Use  . Smoking status: Current Every Day Smoker    Packs/day: 0.25    Years: 20.00    Pack years: 5.00    Types: Cigarettes  .  Smokeless tobacco: Never Used  . Tobacco comment: Quit June 2017, patient states stil smoking some 1 or 2 cigarettes per day.  Vaping Use  . Vaping Use: Never used  Substance and Sexual Activity  . Alcohol use: Yes    Alcohol/week: 0.0 standard drinks    Comment: rarely  . Drug use: No  . Sexual activity: Not on file  Other Topics Concern  . Not on file  Social History Narrative  . Not on file   Social Determinants of Health   Financial Resource Strain: Not on file  Food Insecurity: No Food Insecurity  . Worried About Charity fundraiser in the Last Year: Never true  . Ran Out of Food in the Last Year: Never true  Transportation Needs: No Transportation Needs  . Lack of Transportation (Medical):  No  . Lack of Transportation (Non-Medical): No  Physical Activity: Not on file  Stress: Not on file  Social Connections: Not on file  Intimate Partner Violence: Not on file    FH:  Family History  Problem Relation Age of Onset  . Diabetes Mellitus II Mother   . Heart disease Brother        before age 89  . Colon cancer Neg Hx   . Esophageal cancer Neg Hx   . Stomach cancer Neg Hx   . Anesthesia problems Neg Hx   . Hypotension Neg Hx   . Malignant hyperthermia Neg Hx   . Pseudochol deficiency Neg Hx     Past Medical History:  Diagnosis Date  . Allergy   . Atrial fibrillation ([place])   . Cataract   . CHF (congestive heart failure) ([place])   . Chronic kidney disease    Renal Insuffiecny  . Colon polyps 2012   Colonoscopy  . Diabetes mellitus    type 2  . Diverticulosis 2012   Colonoscopy   . Hyperglycemia 04/2019  . Hyperlipidemia   . Hypertension   . LBBB (left bundle branch block)   . Pneumonia 2015   3 times  . Stroke [facility]) 2015    Current Outpatient Medications  Medication Sig Dispense Refill  . aspirin EC 81 MG EC tablet Take 1 tablet (81 mg total) by mouth daily.    . clopidogrel (PLAVIX) 75 MG tablet Take 75 mg by mouth daily.    . insulin NPH-regular Human (70-30) 100 UNIT/ML injection Inject into the skin as directed. Reports pt is on a SSI    . Insulin Pen Needle 32G X 4 MM MISC Use as directed daily at bedtime. 60 each 0  . iron polysaccharides (FERREX 150) 150 MG capsule Take 1 capsule (150 mg total) by mouth daily. 30 capsule 0  . midodrine (PROAMATINE) 2.5 MG tablet Take 1 tablet (2.5 mg total) by mouth 3 (three) times daily with meals. 90 tablet 3  . Multiple Vitamin (MULTIVITAMIN WITH MINERALS) TABS tablet Take 1 tablet by mouth daily.    . simvastatin (ZOCOR) 40 MG tablet Take 40 mg by mouth daily.    Marland Kitchen torsemide (DEMADEX) 20 MG tablet Take 1 tablet (20 mg total) by mouth 2 (two) times a week. Every Mon and Fri 45 tablet 2   No current  facility-administered medications for this encounter.      Vitals:   11/03/20 0940  BP: 122/80  Pulse: 85  SpO2: 100%  Weight: 74.5 kg (164 lb 3.2 oz)   Wt Readings from Last 3 Encounters:  11/03/20 74.5 kg (164 lb 3.2 oz)  07/29/20 74.4 kg (164 lb)  04/12/20 76.2 kg (168 lb)    Orthostatics  Sitting 124/76 Standing 118/76  PHYSICAL EXAM: General:  Thin elderly appearing. No resp difficulty HEENT: normal Neck: supple. JVP 5-6. Carotids 2+ bilat; no bruits. No lymphadenopathy or thryomegaly appreciated. Cor: PMI nondisplaced. Regular rate & rhythm. No rubs, gallops or murmurs. Lungs: clear Abdomen: soft, nontender, nondistended. No hepatosplenomegaly. No bruits or masses. Good bowel sounds. Extremities: no cyanosis, clubbing, rash, tr edema Neuro: alert & orientedx3, cranial nerves grossly intact. moves all 4 extremities w/o difficulty. Affect pleasant   ASSESSMENT & PLAN:  1. Dizziness - suspect combination of hypoglycemia and orthostatic hypotension - insulin adjusted by PCP - Now much improved with decrease in diuretics and addition of midodrine. Will continue. - Continue compression hose - Continue midodrine 2.5 tid - PYP neagitve  2. Chronic biventricular ZLD:[id number] with a history of non-ischemic cardiomyopathy with EF 25-30% in 2016, improved to 35-40% on echo in 2017.   - Echo  01/2019 revealed EF 15-20%, G3DD, and severe RV hypokinesis.  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 LBBB may also be playing a role - Echo 11/20 EF 20-25% RV ok   - Echo 04/12/20 EF  ~35-40%  - Stable NYHA II-III. Volume status ok. Maybe minimally elevated.  - Will continue torsemide 20 mg M.F. Can take extra as needed. - Carvedilol and Bidil on hold due to low BP - Off spironolactone due to CKD (Last creatinine 2.9) - No SGLT2i at this point with orthostasis but would like to add soon if he can tolarate - LBBB may also be playing a role (though it is atypical) - He saw Dr. Caryl Comes in 12/20 and  felt not to be candidate for ICD and felt he would not benefit from CRT either. - Labs today  2. CAD: - Cath 02/07/19 with likely high-grade distal LM lesion.  - Case has been reviewed with Dr. Prescott Gum who has reviewed images. LAD only graftable vessel and given severe RV dysfunction, advanced age, fraility and CKD not felt to be CABG candidate. Case reviewed with Dr. Burt Knack with regard to possibility of LM stenting. It is highly calcified lesion and with severely reduced EF would need probable Impella support but PAD likely precludes. Additionally, LV was down prior to CAD so may not improve with stenting. I suspect medical management may be best.  - Continues to do well with medical therapy. No s/s angina - On ASA/Plavix. No bleeding  - Continue statin. Goal LDL < 70  3.CKD 3b-IV: - Creatinine baseline 1.9-2.1,  - He now follows with [name].  Most recent creatinine 2.9 (in 12/03/19) - Recheck labs today - Would like to add SGLT2i soon if he can tolerate with recent orthostasis  4. History of CVA: - He is on ASA 81, Plavix, and statin.  - No recent symptoms   5. DM type 2, poorly controlled: - Per primary. Hgba1c 15 in 8/20 - Followed by PCP  6. Tobacco use - counseled on need smoking cessation  Glori Bickers  MD 10:13 AM

## 2020-11-29 ENCOUNTER — Encounter: Payer: Self-pay | Admitting: *Deleted

## 2020-11-29 ENCOUNTER — Telehealth (HOSPITAL_COMMUNITY): Payer: Self-pay | Admitting: Internal Medicine

## 2020-11-29 ENCOUNTER — Other Ambulatory Visit: Payer: Self-pay | Admitting: *Deleted

## 2020-11-29 NOTE — Telephone Encounter (Signed)
Pt complains  of dizziness, being light headed, and no appetite, this has been going on  all day, everyday for the past 2 weeks, please call wife (elaine) [phone] or [phone]. Thanks

## 2020-11-29 NOTE — Telephone Encounter (Signed)
Spoke with patients wife [name]. Wife said pt is the same as he was last office visit. Pt still c/o dizziness and lightheadedness. Pt blood pressure 104/70. Wife said she was told by Dr.Bensimhon to call the office if no improvement.   Routed to [place]

## 2020-11-29 NOTE — Patient Outreach (Signed)
[name]) Care Management  11/29/2020  [name] 08-25-1941 [id number]   Telephone Assessment-Successful-Diabetes  RN spoke with the pt's wife [name]) who provided an update on pt's ongoing management of care. Spouse reports pt has experience some ongoing dizziness and lightheadedness. She is awaiting a call back from [name] office and has an appointment with [name] (nephrologist) on Friday. States this has been occurring over the last 2 weeks with no falls as pt conitnue to use his DME. Nutritionally states pt's appetite is poor as she feeds pt in between meals due to his poor appetite. Also verified pt is hydrating himself enough without fluid retention from his HF. Reports pt's blood pressures around 112-128/70's with is ongoing medication adherence. Spouse reports pt is able to continue to ADLs and mobility around the home however limited with all outings at this time due to his ongoing dizziness. Pt continue to tolerate his new SSI with glucose readings lingering around the 90's in the mornings. Will continue to encourage ongoing adherence with this regimen.   Reviewed and discussed pt's currently plan of care with all goals and interventions. Will note all changes and continue to encouraged adherence with available. Reiterated on pharmacy assistance and social work services via [facility] if needed. Offered to send calendar due to spouse difficulty in organizing pt's readings for his blood pressures/weights and glucose readings for providers to view (receptive and grateful). RN asked all inquires and related questions. Will follow up next month on pt's progress and possible interventions pending with his providers concerning this ongoing symptoms.  Goals Addressed            This Visit's Progress   . THN CM: Make and Keep All Appointments   On track    Follow Up Date 12/31/2020 Timeframe:  Short-Term Goal Priority:  Medium Start Date:    11/01/2020                          Expected End Date:   01/21/2021                      - ask family or friend for a ride - keep a calendar with prescription refill dates - keep a calendar with appointment dates    Why is this important?   Part of staying healthy is seeing the doctor for follow-up care.  If you forget your appointments, there are some things you can do to stay on track.    Notes:  3/7- Spouse verifies pt attendance to all medical appointments with no needed refills on his medications.  Feb-Spouse verified pt continues to have sufficient transportation to all medical appointments. Review all medication and verified refills. Pt taking accordingly to prescription orders. Will continue to encouraged adherence and re-evaluate next month. [place] and [place].  07/27/20 -- reviewed recent and upcoming provider appointments with spouse- confirmed she will continue to provide transportation and has plans to attend all scheduled appointments -- placed care coordination outreach to PCP to initiate triage/ possible urgent appointment to address patient's nonspecific symptoms of weakness with request to address insulin management  08/24/20: -- reviewed recent and upcoming provider appointments with caregiver and confirmed that patient has stable reliable transportation and still drives self; wife able to assist if/ as needed    . THN CM: Manage My Medicine   On track    Follow Up Date 12/31/2020  Timeframe:  Short-Term Goal Priority:  Medium Start Date:   11/01/2020                          Expected End Date: 01/21/2021                   - call for medicine refill 2 or 3 days before it runs out - call if I am sick and can't take my medicine - keep a list of all the medicines I take; vitamins and herbals too    Why is this important?   These steps will help you keep on track with your medicines.    Notes:  3/7-Due to ongoing changes in pt's medication to extend to allow ongoing  adherence on pt's compliance. Verified no changes in pt's medications and no adding  of supplements. Will continue to encouraged adherence with all medications and changes accordingly. Wife is very supportive in assisting pt in managing is medications. Feb-Verified sufficient refills on all medications and pt has enough supplies. Will reiterated on the above goal and interventions related for ongoing prevention measures. Will reassess next month on pt's progress.  07/27/20: -- discussed basic sick day rules with caregiver and placed care coordination to PCP to initiate triage follow up for nonspecific symptoms of weakness reported today by caregiver  08/24/20: -- medication review completed with caregiver today; no concerns or discrepancies identified; confirmed patient's caregiver/ spouse continues to manage patient's medications    . THN CM: Monitor and Manage My Blood Sugar   On track    Follow Up Date 12/31/2020 Timeframe:  Short-Term Goal Priority:  Medium Start Date:    11/01/2020                         Expected End Date:   01/21/2021                    - check blood sugar at prescribed times - check blood sugar if I feel it is too high or too low - enter blood sugar readings and medication or insulin into daily log - take the blood sugar log to all doctor visits - take the blood sugar meter to all doctor visits    Why is this important?   Checking your blood sugar at home helps to keep it from getting very high or very low.  Writing the results in a diary or log helps the doctor know how to care for you.  Your blood sugar log should have the time, date and the results.  Also, write down the amount of insulin or other medicine that you take.  Other information, like what you ate, exercise done and how you were feeling, will also be helpful.     Notes:  3/7-Verified pt's fasting glucose readings are around 91 AM. States continue to the SSI. States appetite not as good with small meals  in between as pt states "he's not hungry". Reporting to MD alone with pt's dizziness and lightheadedness but no falls. Discussed the importance f strength building with mobility and hydration however caution due to pt's HF. [place] office has been notified. Encouraged ongoing safety and use of DME to prevent falls.  Feb-Will verified readings with ranges from 65-79 and recent change in medication not with a SSI on the same insulin medication. Will continue to encourage ongoing documentation of all readings for pt's provider to view  his daily glucose reads.Further discussed dietary habits that can affect his overall A1c readings over the course of 3 months even with good daily glucose reads. Will continue to educate on high protein and low carbohydrates dietary habits..  07/27/20: -- reviewed with caregiver patients recent blood sugar values at home  08/24/20: -- reviewed with caregiver recent blood sugars at home -- confirmed patient's previously reported clinical symptoms and low blood sugar episodes have resolved with recent changes to insulin dosing -- confirmed that patient continues following diabetic diet-- previously provided education reinforced       [name], RN Care Management Coordinator [place] Office [phone]

## 2020-12-03 DIAGNOSIS — I2510 Atherosclerotic heart disease of native coronary artery without angina pectoris: Secondary | ICD-10-CM | POA: Diagnosis not present

## 2020-12-03 DIAGNOSIS — J449 Chronic obstructive pulmonary disease, unspecified: Secondary | ICD-10-CM | POA: Diagnosis not present

## 2020-12-03 DIAGNOSIS — D631 Anemia in chronic kidney disease: Secondary | ICD-10-CM | POA: Diagnosis not present

## 2020-12-03 DIAGNOSIS — E1122 Type 2 diabetes mellitus with diabetic chronic kidney disease: Secondary | ICD-10-CM | POA: Diagnosis not present

## 2020-12-03 DIAGNOSIS — N2581 Secondary hyperparathyroidism of renal origin: Secondary | ICD-10-CM | POA: Diagnosis not present

## 2020-12-03 DIAGNOSIS — N189 Chronic kidney disease, unspecified: Secondary | ICD-10-CM | POA: Diagnosis not present

## 2020-12-03 DIAGNOSIS — I509 Heart failure, unspecified: Secondary | ICD-10-CM | POA: Diagnosis not present

## 2020-12-03 DIAGNOSIS — H3500 Unspecified background retinopathy: Secondary | ICD-10-CM | POA: Diagnosis not present

## 2020-12-03 DIAGNOSIS — E875 Hyperkalemia: Secondary | ICD-10-CM | POA: Diagnosis not present

## 2020-12-03 DIAGNOSIS — N183 Chronic kidney disease, stage 3 unspecified: Secondary | ICD-10-CM | POA: Diagnosis not present

## 2020-12-03 DIAGNOSIS — E785 Hyperlipidemia, unspecified: Secondary | ICD-10-CM | POA: Diagnosis not present

## 2020-12-03 MED ORDER — MIDODRINE HCL 2.5 MG PO TABS
5.0000 mg | ORAL_TABLET | Freq: Three times a day (TID) | ORAL | 3 refills | Status: DC
Start: 2020-12-03 — End: 2020-12-08

## 2020-12-03 NOTE — Telephone Encounter (Signed)
Spoke with pts wife she is aware and verbalized understanding

## 2020-12-03 NOTE — Telephone Encounter (Signed)
Increase midodrine to 5 tid

## 2020-12-08 ENCOUNTER — Other Ambulatory Visit (HOSPITAL_COMMUNITY): Payer: Self-pay | Admitting: Internal Medicine

## 2020-12-10 DIAGNOSIS — R5383 Other fatigue: Secondary | ICD-10-CM | POA: Diagnosis not present

## 2020-12-10 DIAGNOSIS — N184 Chronic kidney disease, stage 4 (severe): Secondary | ICD-10-CM | POA: Diagnosis not present

## 2020-12-10 DIAGNOSIS — I739 Peripheral vascular disease, unspecified: Secondary | ICD-10-CM | POA: Diagnosis not present

## 2020-12-10 DIAGNOSIS — J449 Chronic obstructive pulmonary disease, unspecified: Secondary | ICD-10-CM | POA: Diagnosis not present

## 2020-12-10 DIAGNOSIS — I130 Hypertensive heart and chronic kidney disease with heart failure and stage 1 through stage 4 chronic kidney disease, or unspecified chronic kidney disease: Secondary | ICD-10-CM | POA: Diagnosis not present

## 2020-12-10 DIAGNOSIS — E1122 Type 2 diabetes mellitus with diabetic chronic kidney disease: Secondary | ICD-10-CM | POA: Diagnosis not present

## 2020-12-10 DIAGNOSIS — R059 Cough, unspecified: Secondary | ICD-10-CM | POA: Diagnosis not present

## 2020-12-10 DIAGNOSIS — E785 Hyperlipidemia, unspecified: Secondary | ICD-10-CM | POA: Diagnosis not present

## 2020-12-10 DIAGNOSIS — Z720 Tobacco use: Secondary | ICD-10-CM | POA: Diagnosis not present

## 2020-12-10 DIAGNOSIS — I69959 Hemiplegia and hemiparesis following unspecified cerebrovascular disease affecting unspecified side: Secondary | ICD-10-CM | POA: Diagnosis not present

## 2020-12-10 DIAGNOSIS — I5020 Unspecified systolic (congestive) heart failure: Secondary | ICD-10-CM | POA: Diagnosis not present

## 2020-12-10 DIAGNOSIS — R350 Frequency of micturition: Secondary | ICD-10-CM | POA: Diagnosis not present

## 2020-12-14 ENCOUNTER — Emergency Department (HOSPITAL_COMMUNITY): Payer: Medicare HMO

## 2020-12-14 ENCOUNTER — Emergency Department (HOSPITAL_COMMUNITY)
Admission: EM | Admit: 2020-12-14 | Discharge: 2020-12-15 | Disposition: A | Discharge status: Home / Self Care | Payer: Medicare HMO | Attending: Emergency Medicine | Admitting: Emergency Medicine

## 2020-12-14 DIAGNOSIS — Z794 Long term (current) use of insulin: Secondary | ICD-10-CM | POA: Diagnosis not present

## 2020-12-14 DIAGNOSIS — Z7982 Long term (current) use of aspirin: Secondary | ICD-10-CM | POA: Insufficient documentation

## 2020-12-14 DIAGNOSIS — J189 Pneumonia, unspecified organism: Secondary | ICD-10-CM | POA: Diagnosis not present

## 2020-12-14 DIAGNOSIS — Z85038 Personal history of other malignant neoplasm of large intestine: Secondary | ICD-10-CM | POA: Diagnosis not present

## 2020-12-14 DIAGNOSIS — N184 Chronic kidney disease, stage 4 (severe): Secondary | ICD-10-CM | POA: Diagnosis not present

## 2020-12-14 DIAGNOSIS — Z7902 Long term (current) use of antithrombotics/antiplatelets: Secondary | ICD-10-CM | POA: Diagnosis not present

## 2020-12-14 DIAGNOSIS — R062 Wheezing: Secondary | ICD-10-CM

## 2020-12-14 DIAGNOSIS — R630 Anorexia: Secondary | ICD-10-CM | POA: Diagnosis not present

## 2020-12-14 DIAGNOSIS — Z79899 Other long term (current) drug therapy: Secondary | ICD-10-CM | POA: Insufficient documentation

## 2020-12-14 DIAGNOSIS — R0602 Shortness of breath: Secondary | ICD-10-CM | POA: Diagnosis not present

## 2020-12-14 DIAGNOSIS — I2510 Atherosclerotic heart disease of native coronary artery without angina pectoris: Secondary | ICD-10-CM | POA: Insufficient documentation

## 2020-12-14 DIAGNOSIS — I130 Hypertensive heart and chronic kidney disease with heart failure and stage 1 through stage 4 chronic kidney disease, or unspecified chronic kidney disease: Secondary | ICD-10-CM | POA: Insufficient documentation

## 2020-12-14 DIAGNOSIS — E1122 Type 2 diabetes mellitus with diabetic chronic kidney disease: Secondary | ICD-10-CM | POA: Insufficient documentation

## 2020-12-14 DIAGNOSIS — F17210 Nicotine dependence, cigarettes, uncomplicated: Secondary | ICD-10-CM | POA: Insufficient documentation

## 2020-12-14 DIAGNOSIS — I5043 Acute on chronic combined systolic (congestive) and diastolic (congestive) heart failure: Secondary | ICD-10-CM | POA: Insufficient documentation

## 2020-12-14 DIAGNOSIS — N289 Disorder of kidney and ureter, unspecified: Secondary | ICD-10-CM | POA: Diagnosis not present

## 2020-12-14 DIAGNOSIS — J449 Chronic obstructive pulmonary disease, unspecified: Secondary | ICD-10-CM | POA: Diagnosis not present

## 2020-12-14 DIAGNOSIS — J90 Pleural effusion, not elsewhere classified: Secondary | ICD-10-CM | POA: Diagnosis not present

## 2020-12-14 DIAGNOSIS — Z20822 Contact with and (suspected) exposure to covid-19: Secondary | ICD-10-CM | POA: Insufficient documentation

## 2020-12-14 DIAGNOSIS — I4891 Unspecified atrial fibrillation: Secondary | ICD-10-CM | POA: Diagnosis not present

## 2020-12-14 DIAGNOSIS — N183 Chronic kidney disease, stage 3 unspecified: Secondary | ICD-10-CM | POA: Diagnosis not present

## 2020-12-14 DIAGNOSIS — E785 Hyperlipidemia, unspecified: Secondary | ICD-10-CM | POA: Diagnosis not present

## 2020-12-14 DIAGNOSIS — I5020 Unspecified systolic (congestive) heart failure: Secondary | ICD-10-CM | POA: Diagnosis not present

## 2020-12-14 LAB — URINALYSIS, ROUTINE W REFLEX MICROSCOPIC
Bacteria, UA: NONE SEEN
Bilirubin Urine: NEGATIVE
Glucose, UA: NEGATIVE mg/dL
Hgb urine dipstick: NEGATIVE
Ketones, ur: NEGATIVE mg/dL
Leukocytes,Ua: NEGATIVE
Nitrite: NEGATIVE
Protein, ur: 30 mg/dL — AB
Specific Gravity, Urine: 1.011 (ref 1.005–1.030)
pH: 5 (ref 5.0–8.0)

## 2020-12-14 LAB — COMPREHENSIVE METABOLIC PANEL
ALT: 38 U/L (ref 0–44)
AST: 43 U/L — ABNORMAL HIGH (ref 15–41)
Albumin: 3.3 g/dL — ABNORMAL LOW (ref 3.5–5.0)
Alkaline Phosphatase: 75 U/L (ref 38–126)
Anion gap: 10 (ref 5–15)
BUN: 51 mg/dL — ABNORMAL HIGH (ref 8–23)
CO2: 24 mmol/L (ref 22–32)
Calcium: 8.9 mg/dL (ref 8.9–10.3)
Chloride: 105 mmol/L (ref 98–111)
Creatinine, Ser: 2.92 mg/dL — ABNORMAL HIGH (ref 0.61–1.24)
GFR, Estimated: 21 mL/min — ABNORMAL LOW (ref >60)
Glucose, Bld: 156 mg/dL — ABNORMAL HIGH (ref 70–99)
Potassium: 4.6 mmol/L (ref 3.5–5.1)
Sodium: 139 mmol/L (ref 135–145)
Total Bilirubin: 1 mg/dL (ref 0.3–1.2)
Total Protein: 6.1 g/dL — ABNORMAL LOW (ref 6.5–8.1)

## 2020-12-14 LAB — CBC WITH DIFFERENTIAL/PLATELET
Abs Immature Granulocytes: 0.03 10*3/uL (ref 0.00–0.07)
Basophils Absolute: 0 10*3/uL (ref 0.0–0.1)
Basophils Relative: 0 %
Eosinophils Absolute: 0 10*3/uL (ref 0.0–0.5)
Eosinophils Relative: 0 %
HCT: 40.8 % (ref 39.0–52.0)
Hemoglobin: 13.4 g/dL (ref 13.0–17.0)
Immature Granulocytes: 0 %
Lymphocytes Relative: 12 %
Lymphs Abs: 1.2 10*3/uL (ref 0.7–4.0)
MCH: 31.3 pg (ref 26.0–34.0)
MCHC: 32.8 g/dL (ref 30.0–36.0)
MCV: 95.3 fL (ref 80.0–100.0)
Monocytes Absolute: 1 10*3/uL (ref 0.1–1.0)
Monocytes Relative: 10 %
Neutro Abs: 7.6 10*3/uL (ref 1.7–7.7)
Neutrophils Relative %: 78 %
Platelets: 206 10*3/uL (ref 150–400)
RBC: 4.28 MIL/uL (ref 4.22–5.81)
RDW: 15.9 % — ABNORMAL HIGH (ref 11.5–15.5)
WBC: 9.9 10*3/uL (ref 4.0–10.5)
nRBC: 0.2 % (ref 0.0–0.2)

## 2020-12-14 NOTE — ED Triage Notes (Signed)
Pt reports weakness/ decrease appetite/ SOB  starting a couple weeks ago. Pt denies chest pain/n/v/d

## 2020-12-15 LAB — RESP PANEL BY RT-PCR (FLU A&B, COVID) ARPGX2
Influenza A by PCR: NEGATIVE
Influenza B by PCR: NEGATIVE
SARS Coronavirus 2 by RT PCR: NEGATIVE

## 2020-12-15 LAB — BRAIN NATRIURETIC PEPTIDE: B Natriuretic Peptide: 2613.3 pg/mL — ABNORMAL HIGH (ref 0.0–100.0)

## 2020-12-15 MED ORDER — TORSEMIDE 20 MG PO TABS: 20.0000 mg | ORAL_TABLET | Freq: Every day | ORAL | Status: DC

## 2020-12-15 MED ORDER — ALBUTEROL SULFATE HFA 108 (90 BASE) MCG/ACT IN AERS
2.0000 | INHALATION_SPRAY | Freq: Once | RESPIRATORY_TRACT | Status: AC
  Administered 2020-12-15: 2 via RESPIRATORY_TRACT
  Filled 2020-12-15: qty 6.7

## 2020-12-15 MED ORDER — ALBUTEROL SULFATE (2.5 MG/3ML) 0.083% IN NEBU
5.0000 mg | INHALATION_SOLUTION | Freq: Once | RESPIRATORY_TRACT | Status: AC
  Administered 2020-12-15: 5 mg via RESPIRATORY_TRACT
  Filled 2020-12-15: qty 6

## 2020-12-15 MED ORDER — IPRATROPIUM BROMIDE 0.02 % IN SOLN
0.5000 mg | Freq: Once | RESPIRATORY_TRACT | Status: AC
  Administered 2020-12-15: 0.5 mg via RESPIRATORY_TRACT
  Filled 2020-12-15: qty 2.5

## 2020-12-15 MED ORDER — PREDNISONE 20 MG PO TABS
60.0000 mg | ORAL_TABLET | Freq: Once | ORAL | Status: AC
  Administered 2020-12-15: 60 mg via ORAL
  Filled 2020-12-15: qty 3

## 2020-12-15 NOTE — ED Provider Notes (Signed)
[facility] EMERGENCY DEPARTMENT Provider Note   CSN: [id number] Arrival date & time: 12/14/20  1713     History Chief Complaint  Patient presents with  . Fatigue  . Shortness of Breath    [name] is a 80 y.o. male.  The history is provided by the patient.  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  Patient reports in the past several weeks he had increasing generalized weakness and dyspnea on exertion.  He also reports decreased appetite. He has been seen as an outpatient but is continue to worsening.  No fever/headache/vomiting.  No chest pain.  He is not on home oxygen.     Past Medical History:  Diagnosis Date  . Allergy   . Atrial fibrillation ([place])   . Cataract   . CHF (congestive heart failure) ([place])   . Chronic kidney disease    Renal Insuffiecny  . Colon polyps 2012   Colonoscopy  . Diabetes mellitus    type 2  . Diverticulosis 2012   Colonoscopy   . Hyperglycemia 04/2019  . Hyperlipidemia   . Hypertension   . LBBB (left bundle branch block)   . Pneumonia 2015   3 times  . Stroke [facility]) 2015    Patient Active Problem List   Diagnosis Date Noted  . 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 Protein-calorie malnutrition, severe 05/23/2019  . Hyperosmolar non-ketotic state in patient with type 2 diabetes mellitus ([place]) 05/22/2019  . Uncontrolled diabetes mellitus with hyperglycemia ([place]) 05/21/2019  . Hyperkalemia 05/21/2019  . Acute renal failure superimposed on stage 3 chronic kidney disease ([place]) 05/21/2019  . Prolonged QT interval 05/21/2019  . Acute respiratory failure  with hypoxia ([place]) 04/01/2019  . 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 Goals of care, counseling/discussion 04/01/2019  . Shingles outbreak 04/01/2019  . Community acquired pneumonia of left lower lobe of lung 02/05/2019  . CKD (chronic kidney disease) stage 3, GFR 30-59 ml/min (HCC) 02/05/2019  . Acute CHF (congestive heart failure) ([place]) 02/05/2019  . Acute on chronic congestive heart failure ([place]) 02/04/2019  . Aftercare following surgery of the circulatory system 08/09/2016  . Status post transmetatarsal amputation of foot, right ([place]) 08/03/2016  . 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 Cellulitis and abscess of foot 03/23/2016  . Diabetic infection of right foot ([name]) 03/23/2016  . Puncture wound of foot, right, complicated 03/50/0938  . Malnutrition of moderate degree 03/23/2016  . AKI (acute kidney injury) ([place])   . Tobacco use disorder 02/28/2016  . Acute CVA (cerebrovascular accident) ([place]) 02/27/2016  . DM (diabetes mellitus), type 2 with renal complications ([place]) 18/29/9371  . Anemia 02/27/2016  . Chronic systolic CHF (congestive heart failure) ([place]) 05/05/2015  . 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 Essential hypertension 07/16/2014  . Former smoker 04/21/2014  . 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 Dizziness 01/23/2014  . Diabetes ([place]) 01/23/2014  . Facial droop 01/23/2014  . Benign neoplasm of colon 08/25/2011  .  CAD, NATIVE VESSEL 05/23/2010  . DIABETIC  RETINOPATHY 03/21/2010  . Hyperlipidemia 03/21/2010  . NEUROPATHY 03/21/2010  . Primary hypertension 03/21/2010  . LBBB (left bundle branch block) 03/21/2010  . 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 CERVICAL RADICULOPATHY 03/21/2010    Past Surgical History:  Procedure Laterality Date   . AMPUTATION Right 04/28/2016   Procedure: Right Transmetatarsal Amputation;  Surgeon: Newt Minion, MD;  Location: [facility];  Service: Orthopedics;  Laterality: Right;  . CATARACT EXTRACTION Bilateral    both eyes  . COLON SURGERY     to repair intestines as child  . COLONOSCOPY  10/20/2011   Procedure: COLONOSCOPY;  Surgeon: Inda Castle, MD;  Location: [facility];  Service: Endoscopy;  Laterality: N/A;  . FEMORAL-POPLITEAL BYPASS GRAFT Right 04/07/2016   Procedure: BYPASS GRAFT FEMORAL-POPLITEAL ARTERY-RIGHT;  Surgeon: Angelia Mould, MD;  Location: [facility];  Service: Vascular;  Laterality: Right;  . HOT HEMOSTASIS  10/20/2011   Procedure: HOT HEMOSTASIS (ARGON PLASMA COAGULATION/BICAP);  Surgeon: Inda Castle, MD;  Location: [facility];  Service: Endoscopy;  Laterality: N/A;  . KNEE ARTHROSCOPY Left    left  . NECK SURGERY     disectomy  . PERIPHERAL VASCULAR CATHETERIZATION Right 04/05/2016   Procedure: Lower Extremity Angiography;  Surgeon: Rosetta Posner, MD;  Location: [facility];  Service: Cardiovascular;  Laterality: Right;  . PERIPHERAL VASCULAR CATHETERIZATION N/A 04/05/2016   Procedure: Abdominal Aortogram;  Surgeon: Rosetta Posner, MD;  Location: [facility];  Service: Cardiovascular;  Laterality: N/A;  . RIGHT/LEFT HEART CATH AND CORONARY ANGIOGRAPHY N/A 02/07/2019   Procedure: RIGHT/LEFT HEART CATH AND CORONARY ANGIOGRAPHY;  Surgeon: Jolaine Artist, MD;  Location: [facility];  Service: Cardiovascular;  Laterality: N/A;  . VEIN HARVEST Right 04/07/2016   Procedure: VEIN HARVEST GREATER SAPHENOUS VIEN ;  Surgeon: Angelia Mould, MD;  Location: [facility] OR;  Service: Vascular;  Laterality: Right;       Family History  Problem Relation Age of Onset  . Diabetes Mellitus II Mother   . Heart disease Brother        before age 48  . Colon cancer Neg Hx   . Esophageal cancer Neg Hx   . Stomach cancer Neg Hx   . Anesthesia problems Neg Hx   .  Hypotension Neg Hx   . Malignant hyperthermia Neg Hx   . Pseudochol deficiency Neg Hx     Social History   Tobacco Use  . Smoking status: Current Every Day Smoker    Packs/day: 0.25    Years: 20.00    Pack years: 5.00    Types: Cigarettes  . Smokeless tobacco: Never Used  . Tobacco comment: Quit June 2017, patient states stil smoking some 1 or 2 cigarettes per day.  Vaping Use  . Vaping Use: Never used  Substance Use Topics  . Alcohol use: Yes    Alcohol/week: 0.0 standard drinks    Comment: rarely  . Drug use: No    Home Medications Prior to Admission medications   Medication Sig Start Date End Date Taking? Authorizing Provider  aspirin EC 81 MG EC tablet Take 1 tablet (81 mg total) by mouth daily. 02/11/19   Mariel Aloe, MD  clopidogrel (PLAVIX) 75 MG tablet Take 75 mg by mouth daily.    [provider]  insulin NPH-regular Human (70-30) 100 UNIT/ML injection Inject into the skin as directed. Reports pt is on a SSI  [provider]  Insulin Pen Needle 32G X 4 MM MISC Use as directed daily at bedtime. 05/23/19   Hongalgi, Lenis Dickinson, MD  iron polysaccharides (FERREX 150) 150 MG capsule Take 1 capsule (150 mg total) by mouth daily. 05/23/19   Hongalgi, Lenis Dickinson, MD  midodrine (PROAMATINE) 5 MG tablet Take 1 tablet (5 mg total) by mouth 3 (three) times daily with meals. 12/08/20   Bensimhon, Shaune Pascal, MD  Multiple Vitamin (MULTIVITAMIN WITH MINERALS) TABS tablet Take 1 tablet by mouth daily. 05/23/19   Hongalgi, Lenis Dickinson, MD  simvastatin (ZOCOR) 40 MG tablet Take 40 mg by mouth daily.    [provider]  torsemide (DEMADEX) 20 MG tablet Take 1 tablet (20 mg total) by mouth 2 (two) times a week. Every Mon and Fri 07/29/20   Bensimhon, Shaune Pascal, MD    Allergies    Patient has no known allergies.  Review of Systems   Review of Systems  Constitutional: Positive for appetite change. Negative for fever.  Respiratory: Positive for shortness of breath.    Cardiovascular: Negative for chest pain.  Gastrointestinal: Negative for vomiting.  Neurological: Negative for headaches.  All other systems reviewed and are negative.   Physical Exam Updated Vital Signs BP (!) 125/95 (BP Location: Left Arm)   Pulse 88   Temp 98.4 F (36.9 C) (Oral)   Resp 18   Ht 1.753 m (5\' 9" )   Wt 68.5 kg   SpO2 100%   BMI 22.30 kg/m   Physical Exam CONSTITUTIONAL: Thin and chronically ill-appearing HEAD: Normocephalic/atraumatic EYES: EOMI/PERRL ENMT: Mucous membranes moist NECK: supple no meningeal signs, JVD noted SPINE/BACK:entire spine nontender CV: S1/S2 noted, no murmurs/rubs/gallops noted LUNGS: Wheezing bilaterally ABDOMEN: soft, nontender, no rebound or guarding, bowel sounds noted throughout abdomen GU:no cva tenderness NEURO: Pt is awake/alert/appropriate, moves all extremitiesx4.  No facial droop.   EXTREMITIES: pulses normal/equal, full ROM, mild symmetric pitting edema to bilateral lower extremities SKIN: warm, color normal PSYCH: no abnormalities of mood noted, alert and oriented to situation  ED Results / Procedures / Treatments   Labs (all labs ordered are listed, but only abnormal results are displayed) Labs Reviewed  COMPREHENSIVE METABOLIC PANEL - Abnormal; Notable for the following components:      Result Value   Glucose, Bld 156 (*)    BUN 51 (*)    Creatinine, Ser 2.92 (*)    Total Protein 6.1 (*)    Albumin 3.3 (*)    AST 43 (*)    GFR, Estimated 21 (*)    All other components within normal limits  CBC WITH DIFFERENTIAL/PLATELET - Abnormal; Notable for the following components:   RDW 15.9 (*)    All other components within normal limits  URINALYSIS, ROUTINE W REFLEX MICROSCOPIC - Abnormal; Notable for the following components:   APPearance HAZY (*)    Protein, ur 30 (*)    All other components within normal limits  BRAIN NATRIURETIC PEPTIDE - Abnormal; Notable for the following components:   B Natriuretic Peptide  2,613.3 (*)    All other components within normal limits  RESP PANEL BY RT-PCR (FLU A&B, COVID) [id number]    EKG EKG Interpretation  Date/Time:  Tuesday December 14 2020 17:34:35 EDT Ventricular Rate:  88 PR Interval:  182 QRS Duration: 140 QT Interval:  414 QTC Calculation: 500 R Axis:   114 Text Interpretation: Normal sinus rhythm Right axis deviation Non-specific intra-ventricular conduction block Minimal voltage criteria for LVH, may be normal  variant ( Cornell product ) Cannot rule out Anterior infarct , age undetermined Marked T-wave abnormality, consider inferolateral ischemia Abnormal ECG Confirmed by Ripley Fraise [phone]) on 12/15/2020 12:52:53 AM   Radiology DG Chest 2 View  Result Date: 12/14/2020 CLINICAL DATA:  Shortness of breath EXAM: CHEST - 2 VIEW COMPARISON:  04/18/2019 FINDINGS: Left lower lobe atelectasis or infiltrate with small left effusion. Right lung clear. Heart is normal size. Scattered aortic atherosclerosis. No acute bony abnormality. IMPRESSION: Left lower lobe atelectasis or infiltrate. Cannot exclude pneumonia. Small left effusion. Electronically Signed   By: Rolm Baptise M.D.   On: 12/14/2020 18:15    Procedures Procedures   Medications Ordered in ED Medications  albuterol (VENTOLIN HFA) 108 (90 Base) MCG/ACT inhaler 2 puff (has no administration in time range)  albuterol (PROVENTIL) (2.5 MG/3ML) 0.083% nebulizer solution 5 mg (5 mg Nebulization Given 12/15/20 0320)  ipratropium (ATROVENT) nebulizer solution 0.5 mg (0.5 mg Nebulization Given 12/15/20 0320)  predniSONE (DELTASONE) tablet 60 mg (60 mg Oral Given 12/15/20 0321)    ED Course  I have reviewed the triage vital signs and the nursing notes.  Pertinent labs & imaging results that were available during my care of the patient were reviewed by me and considered in my medical decision making (see chart for details).    MDM Rules/Calculators/A&P                          3:12 AM Patient  presents with generalized weakness and shortness of breath ongoing for several weeks.  He has a history of CHF, chronic renal failure but is also a smoker.  I suspect this is a combination of acute on chronic renal failure, CHF and potentially COPD as patient has wheezing on exam.  He also had mild hypoxia on ambulation with pulse ox 89% Plan to give nebulizers and reassess.  He will likely need admission 4:38 AM Patient feels much improved after nebulized therapy.  His wheezing has resolved.  He has no hypoxia on room air.  BNP noted to be elevated, but no convincing signs of volume overload on chest x-ray  Patient was just seen by PCP and was instructed to start doxycycline and cefdinir for possible pneumonia.  He was also instructed to increase his torsemide to 4 times a week this week.  I gave the patient the option of admission and treatment versus continue medications at home.  Patient would prefer to be discharged home.  I will add on an albuterol MDI as he does not have this at home He will also need follow-up with his nephrologist due to mildly worsened renal insufficiency   Final Clinical Impression(s) / ED Diagnoses Final diagnoses:  Renal insufficiency  Wheezing    Rx / DC Orders ED Discharge Orders    None       Ripley Fraise, MD 12/15/20 [phone]

## 2020-12-28 DIAGNOSIS — I2510 Atherosclerotic heart disease of native coronary artery without angina pectoris: Secondary | ICD-10-CM | POA: Diagnosis not present

## 2020-12-28 DIAGNOSIS — N183 Chronic kidney disease, stage 3 unspecified: Secondary | ICD-10-CM | POA: Diagnosis not present

## 2020-12-28 DIAGNOSIS — I509 Heart failure, unspecified: Secondary | ICD-10-CM | POA: Diagnosis not present

## 2020-12-28 DIAGNOSIS — I25118 Atherosclerotic heart disease of native coronary artery with other forms of angina pectoris: Secondary | ICD-10-CM | POA: Diagnosis not present

## 2020-12-28 DIAGNOSIS — E875 Hyperkalemia: Secondary | ICD-10-CM | POA: Diagnosis not present

## 2020-12-28 DIAGNOSIS — D631 Anemia in chronic kidney disease: Secondary | ICD-10-CM | POA: Diagnosis not present

## 2020-12-28 DIAGNOSIS — I10 Essential (primary) hypertension: Secondary | ICD-10-CM | POA: Diagnosis not present

## 2020-12-28 DIAGNOSIS — J449 Chronic obstructive pulmonary disease, unspecified: Secondary | ICD-10-CM | POA: Diagnosis not present

## 2020-12-28 DIAGNOSIS — E785 Hyperlipidemia, unspecified: Secondary | ICD-10-CM | POA: Diagnosis not present

## 2020-12-28 DIAGNOSIS — Z794 Long term (current) use of insulin: Secondary | ICD-10-CM | POA: Diagnosis not present

## 2020-12-28 DIAGNOSIS — E1122 Type 2 diabetes mellitus with diabetic chronic kidney disease: Secondary | ICD-10-CM | POA: Diagnosis not present

## 2020-12-28 DIAGNOSIS — N2581 Secondary hyperparathyroidism of renal origin: Secondary | ICD-10-CM | POA: Diagnosis not present

## 2020-12-28 DIAGNOSIS — I130 Hypertensive heart and chronic kidney disease with heart failure and stage 1 through stage 4 chronic kidney disease, or unspecified chronic kidney disease: Secondary | ICD-10-CM | POA: Diagnosis not present

## 2020-12-28 DIAGNOSIS — H3500 Unspecified background retinopathy: Secondary | ICD-10-CM | POA: Diagnosis not present

## 2020-12-28 DIAGNOSIS — Z720 Tobacco use: Secondary | ICD-10-CM | POA: Diagnosis not present

## 2020-12-28 DIAGNOSIS — N184 Chronic kidney disease, stage 4 (severe): Secondary | ICD-10-CM | POA: Diagnosis not present

## 2020-12-29 ENCOUNTER — Telehealth (HOSPITAL_COMMUNITY): Payer: Self-pay | Admitting: Vascular Surgery

## 2020-12-29 MED ORDER — TORSEMIDE 20 MG PO TABS: 20.0000 mg | ORAL_TABLET | Freq: Every day | ORAL | 2 refills | Status: DC

## 2020-12-29 NOTE — Telephone Encounter (Addendum)
[phone] (M) pts wife called to report pt was seen by pcp Given depression medications and diuretic increased to daily as opposed to twice a week pts wife would like follow up sooner than scheduled appt  Advised I could get him on with APP for fluid recheck  pts wife declined reports she will have Dr Beverly Gust office note sent over, see how well depression medication is tolerated with med increase

## 2020-12-29 NOTE — Telephone Encounter (Signed)
Pt wife called schedule line wanting to speak o triage nurse, she stats she left message yesterday with no call back.. please advise

## 2020-12-31 ENCOUNTER — Other Ambulatory Visit: Payer: Self-pay | Admitting: *Deleted

## 2020-12-31 NOTE — Patient Outreach (Signed)
[place] [place] [facility]) Care Management  12/31/2020  Theresa Dohrman Aug 27, 1941 [id number]   Telephone Assessment-Unsuccessful  RN attempted outreach call however unsuccessful. RN able to leave  HIPAA voice message requesting a call back.  Will continue care management services on the received back. Will reschedule another outreach call over the next few days.  [name], RN Care Management Coordinator [place] Office [phone]

## 2021-01-06 ENCOUNTER — Other Ambulatory Visit: Payer: Self-pay | Admitting: *Deleted

## 2021-01-06 NOTE — Patient Outreach (Signed)
[place] [facility]) Care Management  01/06/2021  [name] Apr 10, 1941 [id number]   Telephone Assessment-Unsuccessful-DM  RN attempted outreach call today however unsuccessful. RN able to leave a HIPAA approved voice message requesting a call back.   Will further engage at that time and send outreach letter. Will reschedule another outreach next week for ongoing services.  [name], RN Care Management Coordinator [place] Office [phone]

## 2021-01-09 NOTE — Progress Notes (Signed)
ADVANCED HF CLINIC NOTE  PCP: Dr Dagmar Hait  Primary HF Cardiologist: Dr Haroldine Laws   HPI: [name] is a 80 y.o. 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 Echo EF 15-20% with severe RV dysfunction. Had RHC/LHC with volume overlaod and high grade disttal LM lesion. CT surgery consulted. He was not thought to be surgery candidate given severe RV dysfunction, advanced age, fraility and CKD. Case reviewed with Dr. Burt Knack with regard to possibility of LM stenting. It is highly calcified lesion and with severely reduced EF would need probable Impella support but PAD likely precludes. Additionally, LV was down prior to CAD so may not improve with stenting -> medical management recommended. Improved markedly with diuresis. D/c weight 146   Readmitted to [facility] in 8/20 with poorly controlled DM2 (HgBa1c 15%). Treated with IV insulin and IVF.  Here for add-on visit for ongoing lightheadedness and weakness.. I saw him in 2/22 and was feeling weak and dizzy. Carvedilol and Bidil had been stopped by [name] (Nephrology). I then  decreased the torsemide to 2x/week (Monday and Friday) as SBP dropped 96 -> 76 with standing also added midodrine 2.5 tid. Symptoms improved initially but says have now returned.   Seen by PCP and was treated for CAP with abx and told to increase torsemide to 4 times a week for one week.  Told if not getting better to go to ED.  Hgb A1C was 8 from 11 in March.  12/14/20 was seen in the ED for increased dyspnea and dizziness.  BNP was elevated at 2600, Cr was up to 2.92 his baseline  appears to be around 2.3.  He was noted to be wheezing, chest x-ray with LLL atelectasis vs infiltrate and small left pleural effusion, additionally he was wheezing on exam felt to be mixed respiratory failure from COPD/CHF.  Given a breathing treatment and prednisone with improvement and told to follow up with his nephrologist.   Saw his nephrologist [name] April 5th, felt weak and run down, shortness of breath was stable and maintaining with nebulizer therapy.  Felt to be mildly volume up and was told to increase torsemide to daily.  It was recommended the patient start an antidepressant but patient did not want to take this.    Appetite has improved some but still very weak, denies shortness of breath at rest.  Denies any chest pain. Did not get too short of breath walking from the parking lot to here but any farther he would.  Still smoking 2-3 cigs per day,  Has been losing significant amounts of weight over the last 4 months.  Was 164lbs two months ago Has lost 20lbs since then, 176lbs one year ago.  Had no appetite and endorses early satiety but no abdominal pain.     PYP 11/21 negative   Echo  04/12/20 EF 35-40%   RHC/LHC 02/07/2019   Mid RCA to Dist RCA lesion is 50% stenosed.  Prox RCA lesion is 40% stenosed.  Mid LM to Dist LM lesion is 80% stenosed.  RPDA lesion is 90% stenosed.  Ost Cx to Prox Cx lesion is  80% stenosed.  Dist LM to Ost LAD lesion is 60% stenosed.  Prox Cx to Mid Cx lesion is 50% stenosed.  Prox LAD to Mid LAD lesion is 70% stenosed.  Findings: Ao = 136/76 (97) LV = 139/28 RA = 9 RV = 65/10 PA = 67/29 (48) PCW = 36 Fick cardiac output/index = 4.1/2.2 PVR = 3.0 WU SVR = 1721 Assessment: 1. Severe 2v CAD with probable high grade, calcified distal left main lesion 2. EF 20-25% due to iCM 3. Elevated filling pressures with moderately reduced CO  Echo  EF 40-45% in 2015  EF 25-30% in 2016 EF 35-40% in 2017 EF 15-20% in 2020  Severe RV  dysfunction.   ROS: All systems negative except as listed in HPI, PMH and Problem List.  SH:  Social History   Socioeconomic History  . Marital status: Married    Spouse name: Not on file  . Number of children: 3  . Years of education: Not on file  . Highest education level: Not on file  Occupational History  . Occupation: Retired  Tobacco Use  . Smoking status: Current Every Day Smoker    Packs/day: 0.25    Years: 20.00    Pack years: 5.00    Types: Cigarettes  . Smokeless tobacco: Never Used  . Tobacco comment: Quit June 2017, patient states stil smoking some 1 or 2 cigarettes per day.  Vaping Use  . Vaping Use: Never used  Substance and Sexual Activity  . Alcohol use: Yes    Alcohol/week: 0.0 standard drinks    Comment: rarely  . Drug use: No  . Sexual activity: Not on file  Other Topics Concern  . Not on file  Social History Narrative  . Not on file   Social Determinants of Health   Financial Resource Strain: Not on file  Food Insecurity: No Food Insecurity  . Worried About Charity fundraiser in the Last Year: Never true  . Ran Out of Food in the Last Year: Never true  Transportation Needs: No Transportation Needs  . Lack of Transportation (Medical): No  . Lack of Transportation (Non-Medical): No  Physical Activity: Not on file  Stress: Not on file  Social Connections: Not on file  Intimate Partner Violence: Not on file    FH:  Family History  Problem Relation Age of Onset  . Diabetes Mellitus II Mother   . Heart disease Brother        before age 5  . Colon cancer Neg Hx   . Esophageal cancer Neg Hx   . Stomach cancer Neg Hx   . Anesthesia problems Neg Hx   . Hypotension Neg Hx   . Malignant hyperthermia Neg Hx   . Pseudochol deficiency Neg Hx     Past Medical History:  Diagnosis Date  . Allergy   . Atrial fibrillation ([place])   . Cataract   . CHF (congestive heart failure) ([place])   . Chronic kidney disease    Renal Insuffiecny  . Colon  polyps 2012   Colonoscopy  . Diabetes mellitus    type 2  . Diverticulosis 2012   Colonoscopy   . Hyperglycemia 04/2019  . Hyperlipidemia   . Hypertension   . LBBB (left bundle branch block)   . Pneumonia 2015   3 times  . Stroke [facility]) 2015    Current Outpatient Medications  Medication Sig Dispense Refill  . aspirin EC 81 MG EC tablet Take 1 tablet (81 mg total)  by mouth daily.    . clopidogrel (PLAVIX) 75 MG tablet Take 75 mg by mouth daily.    . insulin aspart protamine- aspart (NOVOLOG MIX 70/30) (70-30) 100 UNIT/ML injection Inject into the skin. Sliding scale    . Insulin Pen Needle 32G X 4 MM MISC Use as directed daily at bedtime. 60 each 0  . iron polysaccharides (FERREX 150) 150 MG capsule Take 1 capsule (150 mg total) by mouth daily. 30 capsule 0  . midodrine (PROAMATINE) 5 MG tablet Take 1 tablet (5 mg total) by mouth 3 (three) times daily with meals. 90 tablet 3  . Multiple Vitamin (MULTIVITAMIN WITH MINERALS) TABS tablet Take 1 tablet by mouth daily.    . simvastatin (ZOCOR) 40 MG tablet Take 40 mg by mouth daily.    Marland Kitchen torsemide (DEMADEX) 20 MG tablet Take 1 tablet (20 mg total) by mouth daily. 30 tablet 2   No current facility-administered medications for this encounter.      Vitals:   01/10/21 1349  BP: 104/78  Pulse: 87  SpO2: 99%  Weight: 65.4 kg (144 lb 3.2 oz)   Wt Readings from Last 3 Encounters:  01/10/21 65.4 kg (144 lb 3.2 oz)  12/14/20 68.5 kg (151 lb)  11/03/20 74.5 kg (164 lb 3.2 oz)    Orthostatics done personally   Sitting 124/76 Standing 126/76  PHYSICAL EXAM: General:  Thin elderly appearing. No resp difficulty HEENT: normal Neck: supple. JVP 5-6. Carotids 2+ bilat; no bruits. No lymphadenopathy or thryomegaly appreciated. Cor: PMI nondisplaced. Regular rate & rhythm. No rubs, gallops or murmurs. Lungs: clear Abdomen: soft, nontender, nondistended. No hepatosplenomegaly. No bruits or masses. Good bowel sounds. Extremities: no  cyanosis, clubbing, rash, tr edema Neuro: alert & orientedx3, cranial nerves grossly intact. moves all 4 extremities w/o difficulty. Affect pleasant   ASSESSMENT & PLAN:  1. Dizziness - HF meds recently cut back and midodrine added for orthostasis - Orthostatic vital signs negative today, Continue midodrine 2.5 tid - PYP negative  2. Chronic biventricular DQQ:IWLN -  EF 25-30% in 2016, improved to 35-40% on echo in 2017.   - Echo  01/2019 revealed EF 15-20%, G3DD, and severe RV hypokinesis.  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 LBBB may also be playing a role - Echo 11/20 EF 20-25% RV ok   - Echo 04/12/20 EF  ~35-40%  - Increasingly weak NYHA III-IIIB Volume status dry on examination  - Switch torsemide 20mg  to every other day, he can take additional PRN - Carvedilol and Bidil on hold due to low BP - Off spironolactone due to CKD (Last creatinine 2.9) - No SGLT2i at this point with worsening renal function, weight loss and backing off on diuretic therapy - LBBB may also be playing a role (though it is atypical) - He saw Dr. Caryl Comes in 12/20 and felt not to be candidate for ICD and felt he would not benefit from CRT either. - Labs today  3. Unintentional Weight Loss -cachectic on examination, endorses decreased appetite, lost 20lbs in the last 2 months. 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 Last CT of chest in 2020 with no signs of malignancy.   -Begin workup with CT Chest/abd/pelvis (noncontrast)   4. CAD: - Cath 02/07/19 with likely high-grade distal LM lesion.  - Case has been reviewed with Dr. Prescott Gum who has reviewed images. LAD only graftable vessel and  given severe RV dysfunction, advanced age, fraility and CKD not felt to be CABG candidate. Case reviewed with Dr. Burt Knack with regard to possibility of LM stenting. It is highly calcified lesion and with severely reduced  EF would need probable Impella support but PAD likely precludes. Additionally, LV was down prior to CAD so may not improve with stenting. I suspect medical management may be best.  - Continues to do well with medical therapy. No s/s angina - On ASA/Plavix. No bleeding  - Continue statin. Goal LDL < 70 - Not currently a good candidate for intervention, I don't think his renal function will tolerate it and likely doesn't have the cardiopulmonary reserve to get through it.  I don't feel his heart is the biggest issue currently.    4.CKD 3b-IV: - Creatinine baseline 1.9-2.1, Follows with [name] - Slightly worse with increase in diuretics.  Most recent creatinine 2.9 (in 12/14/20) - Recheck labs today - Would like to add SGLT2i in the future if he can tolerate but not now with worsening renal fx, weight loss, also with recent orthostasis  5. History of CVA: - He is on ASA 81, Plavix, and statin.  - No recent symptoms   6. DM type 2, poorly controlled: - Per primary. Hgba1c 15 in 8/20 has improved Hgb A1C was 8 from 11 on 11/2020 - Followed by PCP  7. Tobacco use - counseled on need smoking cessation  Total time spent 35 minutes. Over half that time spent discussing above.     Peter Bickers, MD  3:33 PM

## 2021-01-10 ENCOUNTER — Ambulatory Visit (HOSPITAL_COMMUNITY)
Admission: RE | Admit: 2021-01-10 | Discharge: 2021-01-10 | Disposition: A | Discharge status: Home / Self Care | Payer: Medicare HMO | Source: Ambulatory Visit | Attending: Internal Medicine | Admitting: Internal Medicine

## 2021-01-10 ENCOUNTER — Encounter (HOSPITAL_COMMUNITY): Payer: Self-pay | Admitting: Internal Medicine

## 2021-01-10 ENCOUNTER — Other Ambulatory Visit: Payer: Self-pay

## 2021-01-10 VITALS — BP 104/78 | HR 87 | Wt 144.2 lb

## 2021-01-10 DIAGNOSIS — E785 Hyperlipidemia, unspecified: Secondary | ICD-10-CM | POA: Diagnosis not present

## 2021-01-10 DIAGNOSIS — I447 Left bundle-branch block, unspecified: Secondary | ICD-10-CM | POA: Diagnosis not present

## 2021-01-10 DIAGNOSIS — Z8249 Family history of ischemic heart disease and other diseases of the circulatory system: Secondary | ICD-10-CM | POA: Diagnosis not present

## 2021-01-10 DIAGNOSIS — Z7982 Long term (current) use of aspirin: Secondary | ICD-10-CM | POA: Insufficient documentation

## 2021-01-10 DIAGNOSIS — R42 Dizziness and giddiness: Secondary | ICD-10-CM | POA: Insufficient documentation

## 2021-01-10 DIAGNOSIS — I5082 Biventricular heart failure: Secondary | ICD-10-CM | POA: Insufficient documentation

## 2021-01-10 DIAGNOSIS — I951 Orthostatic hypotension: Secondary | ICD-10-CM

## 2021-01-10 DIAGNOSIS — Z79899 Other long term (current) drug therapy: Secondary | ICD-10-CM | POA: Insufficient documentation

## 2021-01-10 DIAGNOSIS — E1165 Type 2 diabetes mellitus with hyperglycemia: Secondary | ICD-10-CM | POA: Insufficient documentation

## 2021-01-10 DIAGNOSIS — Z794 Long term (current) use of insulin: Secondary | ICD-10-CM | POA: Diagnosis not present

## 2021-01-10 DIAGNOSIS — Z833 Family history of diabetes mellitus: Secondary | ICD-10-CM | POA: Diagnosis not present

## 2021-01-10 DIAGNOSIS — E1122 Type 2 diabetes mellitus with diabetic chronic kidney disease: Secondary | ICD-10-CM | POA: Insufficient documentation

## 2021-01-10 DIAGNOSIS — I5022 Chronic systolic (congestive) heart failure: Secondary | ICD-10-CM

## 2021-01-10 DIAGNOSIS — F17210 Nicotine dependence, cigarettes, uncomplicated: Secondary | ICD-10-CM | POA: Diagnosis not present

## 2021-01-10 DIAGNOSIS — Z8673 Personal history of transient ischemic attack (TIA), and cerebral infarction without residual deficits: Secondary | ICD-10-CM | POA: Diagnosis not present

## 2021-01-10 DIAGNOSIS — I428 Other cardiomyopathies: Secondary | ICD-10-CM | POA: Diagnosis not present

## 2021-01-10 DIAGNOSIS — R634 Abnormal weight loss: Secondary | ICD-10-CM

## 2021-01-10 DIAGNOSIS — I2510 Atherosclerotic heart disease of native coronary artery without angina pectoris: Secondary | ICD-10-CM | POA: Insufficient documentation

## 2021-01-10 DIAGNOSIS — I130 Hypertensive heart and chronic kidney disease with heart failure and stage 1 through stage 4 chronic kidney disease, or unspecified chronic kidney disease: Secondary | ICD-10-CM | POA: Insufficient documentation

## 2021-01-10 DIAGNOSIS — N1832 Chronic kidney disease, stage 3b: Secondary | ICD-10-CM

## 2021-01-10 DIAGNOSIS — Z7902 Long term (current) use of antithrombotics/antiplatelets: Secondary | ICD-10-CM | POA: Diagnosis not present

## 2021-01-10 NOTE — Addendum Note (Signed)
Encounter addended by: Jolaine Artist, MD on: 01/10/2021 3:37 PM  Actions taken: Level of Service modified, Visit diagnoses modified

## 2021-01-10 NOTE — Patient Instructions (Addendum)
No Labs done today.   EKG done today.  No medication changes were made. Please continue all current medications as prescribed.  Non-Cardiac CT scanning, (CAT scanning), is a noninvasive, special x-ray that produces cross-sectional images of the body using x-rays and a computer. CT scans help physicians diagnose and treat medical conditions. For some CT exams, a contrast material is used to enhance visibility in the area of the body being studied. CT scans provide greater clarity and reveal more details than regular x-ray exams. This has to be approved through your insurance prior to scheduling, once approved we will contact you to schedule an appointment.  Your physician recommends that you keep your pending appointment on June 10th, 2022 echo at [place] and appointment at 10 am both appointments here in our office. GATE CODE for June is 4233   If you have any questions or concerns before your next appointment please send [place] a message through [place] or call our office at [phone].    TO LEAVE A MESSAGE FOR THE NURSE SELECT OPTION 2, PLEASE LEAVE A MESSAGE INCLUDING: . YOUR NAME . DATE OF BIRTH . CALL BACK NUMBER . REASON FOR CALL**this is important as we prioritize the call backs  YOU WILL RECEIVE A CALL BACK THE SAME DAY AS LONG AS YOU CALL BEFORE 4:00 PM   Do the following things EVERYDAY: 1) Weigh yourself in the morning before breakfast. Write it down and keep it in a log. 2) Take your medicines as prescribed 3) Eat low salt foods--Limit salt (sodium) to 2000 mg per day.  4) Stay as active as you can everyday 5) Limit all fluids for the day to less than 2 liters   At the [facility], you and your health needs are our priority. As part of our continuing mission to provide you with exceptional heart care, we have created designated Provider Care Teams. These Care Teams include your primary Cardiologist (physician) and Advanced Practice Providers (APPs- Physician  Assistants and Nurse Practitioners) who all work together to provide you with the care you need, when you need it.   You may see any of the following providers on your designated Care Team at your next follow up: Marland Kitchen Dr Glori Bickers . Dr Loralie Champagne . Darrick Grinder, NP . Lyda Jester, PA . Audry Riles, PharmD   Please be sure to bring in all your medications bottles to every appointment.

## 2021-01-12 ENCOUNTER — Other Ambulatory Visit: Payer: Self-pay | Admitting: *Deleted

## 2021-01-12 NOTE — Patient Outreach (Signed)
[facility]) Care Management  01/12/2021  [name] 23-Nov-1940 [id number]   Telephone Assessment-Unsuccessful Outreach #3  RN attempted outreach call however unsuccessful. RN able to leave a HIPAA approved voice message requesting a call back for ongoing [facility] services.  Will attempt once again a call next month for ongoing [facility] services,  [name], RN Care Management Coordinator [place] Office [phone]

## 2021-01-18 ENCOUNTER — Other Ambulatory Visit: Payer: Self-pay

## 2021-01-18 ENCOUNTER — Inpatient Hospital Stay (HOSPITAL_COMMUNITY)
Admission: EM | Admit: 2021-01-18 | Discharge: 2021-01-22 | DRG: 280 | Disposition: A | Discharge status: Home Health | Payer: Medicare HMO | Attending: Internal Medicine | Admitting: Internal Medicine

## 2021-01-18 ENCOUNTER — Encounter (HOSPITAL_COMMUNITY): Payer: Self-pay

## 2021-01-18 ENCOUNTER — Emergency Department (HOSPITAL_COMMUNITY): Payer: Medicare HMO

## 2021-01-18 DIAGNOSIS — Z8249 Family history of ischemic heart disease and other diseases of the circulatory system: Secondary | ICD-10-CM

## 2021-01-18 DIAGNOSIS — I5082 Biventricular heart failure: Secondary | ICD-10-CM | POA: Diagnosis present

## 2021-01-18 DIAGNOSIS — I2510 Atherosclerotic heart disease of native coronary artery without angina pectoris: Secondary | ICD-10-CM | POA: Diagnosis present

## 2021-01-18 DIAGNOSIS — Z89431 Acquired absence of right foot: Secondary | ICD-10-CM

## 2021-01-18 DIAGNOSIS — R627 Adult failure to thrive: Secondary | ICD-10-CM | POA: Diagnosis present

## 2021-01-18 DIAGNOSIS — E785 Hyperlipidemia, unspecified: Secondary | ICD-10-CM | POA: Diagnosis present

## 2021-01-18 DIAGNOSIS — I130 Hypertensive heart and chronic kidney disease with heart failure and stage 1 through stage 4 chronic kidney disease, or unspecified chronic kidney disease: Secondary | ICD-10-CM | POA: Diagnosis present

## 2021-01-18 DIAGNOSIS — I517 Cardiomegaly: Secondary | ICD-10-CM | POA: Diagnosis not present

## 2021-01-18 DIAGNOSIS — E1122 Type 2 diabetes mellitus with diabetic chronic kidney disease: Secondary | ICD-10-CM | POA: Diagnosis present

## 2021-01-18 DIAGNOSIS — F17210 Nicotine dependence, cigarettes, uncomplicated: Secondary | ICD-10-CM | POA: Diagnosis present

## 2021-01-18 DIAGNOSIS — I951 Orthostatic hypotension: Secondary | ICD-10-CM | POA: Diagnosis present

## 2021-01-18 DIAGNOSIS — E1129 Type 2 diabetes mellitus with other diabetic kidney complication: Secondary | ICD-10-CM | POA: Diagnosis present

## 2021-01-18 DIAGNOSIS — Z7902 Long term (current) use of antithrombotics/antiplatelets: Secondary | ICD-10-CM

## 2021-01-18 DIAGNOSIS — E43 Unspecified severe protein-calorie malnutrition: Secondary | ICD-10-CM | POA: Diagnosis present

## 2021-01-18 DIAGNOSIS — I214 Non-ST elevation (NSTEMI) myocardial infarction: Principal | ICD-10-CM | POA: Diagnosis present

## 2021-01-18 DIAGNOSIS — I428 Other cardiomyopathies: Secondary | ICD-10-CM | POA: Diagnosis present

## 2021-01-18 DIAGNOSIS — Z794 Long term (current) use of insulin: Secondary | ICD-10-CM

## 2021-01-18 DIAGNOSIS — Z20822 Contact with and (suspected) exposure to covid-19: Secondary | ICD-10-CM | POA: Diagnosis present

## 2021-01-18 DIAGNOSIS — N3941 Urge incontinence: Secondary | ICD-10-CM | POA: Diagnosis present

## 2021-01-18 DIAGNOSIS — I4891 Unspecified atrial fibrillation: Secondary | ICD-10-CM | POA: Diagnosis present

## 2021-01-18 DIAGNOSIS — R066 Hiccough: Secondary | ICD-10-CM | POA: Diagnosis not present

## 2021-01-18 DIAGNOSIS — Z8673 Personal history of transient ischemic attack (TIA), and cerebral infarction without residual deficits: Secondary | ICD-10-CM

## 2021-01-18 DIAGNOSIS — R54 Age-related physical debility: Secondary | ICD-10-CM | POA: Diagnosis present

## 2021-01-18 DIAGNOSIS — E119 Type 2 diabetes mellitus without complications: Secondary | ICD-10-CM

## 2021-01-18 DIAGNOSIS — R778 Other specified abnormalities of plasma proteins: Secondary | ICD-10-CM

## 2021-01-18 DIAGNOSIS — R7989 Other specified abnormal findings of blood chemistry: Secondary | ICD-10-CM | POA: Diagnosis not present

## 2021-01-18 DIAGNOSIS — Z79899 Other long term (current) drug therapy: Secondary | ICD-10-CM

## 2021-01-18 DIAGNOSIS — Z6821 Body mass index (BMI) 21.0-21.9, adult: Secondary | ICD-10-CM

## 2021-01-18 DIAGNOSIS — I6202 Nontraumatic subacute subdural hemorrhage: Secondary | ICD-10-CM | POA: Diagnosis present

## 2021-01-18 DIAGNOSIS — J90 Pleural effusion, not elsewhere classified: Secondary | ICD-10-CM | POA: Diagnosis not present

## 2021-01-18 DIAGNOSIS — I5043 Acute on chronic combined systolic (congestive) and diastolic (congestive) heart failure: Secondary | ICD-10-CM | POA: Diagnosis present

## 2021-01-18 DIAGNOSIS — Z515 Encounter for palliative care: Secondary | ICD-10-CM

## 2021-01-18 DIAGNOSIS — Z7982 Long term (current) use of aspirin: Secondary | ICD-10-CM

## 2021-01-18 DIAGNOSIS — Z833 Family history of diabetes mellitus: Secondary | ICD-10-CM

## 2021-01-18 DIAGNOSIS — Z86010 Personal history of colonic polyps: Secondary | ICD-10-CM

## 2021-01-18 DIAGNOSIS — N184 Chronic kidney disease, stage 4 (severe): Secondary | ICD-10-CM | POA: Diagnosis present

## 2021-01-18 DIAGNOSIS — R0602 Shortness of breath: Secondary | ICD-10-CM | POA: Diagnosis not present

## 2021-01-18 DIAGNOSIS — R21 Rash and other nonspecific skin eruption: Secondary | ICD-10-CM | POA: Diagnosis not present

## 2021-01-18 DIAGNOSIS — R64 Cachexia: Secondary | ICD-10-CM | POA: Diagnosis present

## 2021-01-18 DIAGNOSIS — I447 Left bundle-branch block, unspecified: Secondary | ICD-10-CM | POA: Diagnosis present

## 2021-01-18 DIAGNOSIS — E875 Hyperkalemia: Secondary | ICD-10-CM | POA: Diagnosis present

## 2021-01-18 DIAGNOSIS — I509 Heart failure, unspecified: Secondary | ICD-10-CM | POA: Diagnosis not present

## 2021-01-18 LAB — URINALYSIS, ROUTINE W REFLEX MICROSCOPIC
Bacteria, UA: NONE SEEN
Bilirubin Urine: NEGATIVE
Glucose, UA: NEGATIVE mg/dL
Hgb urine dipstick: NEGATIVE
Ketones, ur: NEGATIVE mg/dL
Leukocytes,Ua: NEGATIVE
Nitrite: NEGATIVE
Protein, ur: 100 mg/dL — AB
Specific Gravity, Urine: 1.019 (ref 1.005–1.030)
pH: 5 (ref 5.0–8.0)

## 2021-01-18 LAB — CBC WITH DIFFERENTIAL/PLATELET
Abs Immature Granulocytes: 0.03 10*3/uL (ref 0.00–0.07)
Basophils Absolute: 0 10*3/uL (ref 0.0–0.1)
Basophils Relative: 0 %
Eosinophils Absolute: 0.2 10*3/uL (ref 0.0–0.5)
Eosinophils Relative: 3 %
HCT: 41.9 % (ref 39.0–52.0)
Hemoglobin: 13.1 g/dL (ref 13.0–17.0)
Immature Granulocytes: 0 %
Lymphocytes Relative: 20 %
Lymphs Abs: 1.4 10*3/uL (ref 0.7–4.0)
MCH: 30 pg (ref 26.0–34.0)
MCHC: 31.3 g/dL (ref 30.0–36.0)
MCV: 95.9 fL (ref 80.0–100.0)
Monocytes Absolute: 0.7 10*3/uL (ref 0.1–1.0)
Monocytes Relative: 10 %
Neutro Abs: 4.8 10*3/uL (ref 1.7–7.7)
Neutrophils Relative %: 67 %
Platelets: 201 10*3/uL (ref 150–400)
RBC: 4.37 MIL/uL (ref 4.22–5.81)
RDW: 14.6 % (ref 11.5–15.5)
WBC: 7.1 10*3/uL (ref 4.0–10.5)
nRBC: 0 % (ref 0.0–0.2)

## 2021-01-18 NOTE — ED Triage Notes (Signed)
Pt states he was short of breath and felt like he was going to pass out three times today. Pt states he hit his chest he felt better. Pt denies pain at this time, states he occasionally feels short of breath.

## 2021-01-18 NOTE — ED Provider Notes (Signed)
  Emergency Medicine Provider in Triage Note   MSE was initiated and I personally evaluated the patient  10:38 PM on January 18, 2021 as provider in triage.   Chief Complaint: dyspnea, lightheadedness  HPI  Patient is a 80 y.o. who presets to the ED with complaints of intermittent dyspnea/lightheadedness episodes today. Patient reports 3 episodes during which he was attempting to transition from sitting to standing and started to feel short of breath and like "my eyes were rolling back in my head". He subsequently hit himself in the chest with resolution of sxs. Had similar episode a few weeks ago. Denies recent med changes. Admits to poor PO intake over past few days. Denies chest pain or leg swelling.     Review of Systems  Positive: Lightheadedness, dyspnea Negative: Chest pain, syncope, leg swelling  Physical Exam  BP 125/89 (BP Location: Right Arm)   Pulse 98   Temp 97.9 F (36.6 C) (Oral)   Resp 17   SpO2 96%    Gen:   Awake, no distress   HEENT:  Atraumatic  Resp:  Normal effort  Cardiac:  Normal rate  Abd:   Nondistended, nontender  MSK:   Moves extremities without difficulty, trace symmetric LE edema noted.  Neuro:  Speech clear   Medical Decision Making   Initiation of care has begun. The patient has been counseled on the process, plan, and necessity for staying for the completion/evaluation, informed that the remainder of the evaluation will be completed by another provider, this initial triage assessment does not replace that evaluation, and the importance of remaining in the ED until their evaluation is complete.   Clinical Impression  Near syncope.        Leafy Kindle 01/18/21 2240    Gareth Morgan, MD 01/19/21 1524

## 2021-01-19 ENCOUNTER — Ambulatory Visit: Payer: Self-pay | Admitting: *Deleted

## 2021-01-19 ENCOUNTER — Inpatient Hospital Stay (HOSPITAL_COMMUNITY): Payer: Medicare HMO

## 2021-01-19 ENCOUNTER — Emergency Department (HOSPITAL_COMMUNITY): Payer: Medicare HMO

## 2021-01-19 ENCOUNTER — Encounter (HOSPITAL_COMMUNITY): Payer: Self-pay | Admitting: Internal Medicine

## 2021-01-19 DIAGNOSIS — I5023 Acute on chronic systolic (congestive) heart failure: Secondary | ICD-10-CM

## 2021-01-19 DIAGNOSIS — R0602 Shortness of breath: Secondary | ICD-10-CM | POA: Diagnosis not present

## 2021-01-19 DIAGNOSIS — I509 Heart failure, unspecified: Secondary | ICD-10-CM | POA: Insufficient documentation

## 2021-01-19 DIAGNOSIS — R627 Adult failure to thrive: Secondary | ICD-10-CM | POA: Diagnosis not present

## 2021-01-19 DIAGNOSIS — I130 Hypertensive heart and chronic kidney disease with heart failure and stage 1 through stage 4 chronic kidney disease, or unspecified chronic kidney disease: Secondary | ICD-10-CM | POA: Diagnosis not present

## 2021-01-19 DIAGNOSIS — I6389 Other cerebral infarction: Secondary | ICD-10-CM | POA: Diagnosis not present

## 2021-01-19 DIAGNOSIS — I2510 Atherosclerotic heart disease of native coronary artery without angina pectoris: Secondary | ICD-10-CM | POA: Diagnosis present

## 2021-01-19 DIAGNOSIS — I6202 Nontraumatic subacute subdural hemorrhage: Secondary | ICD-10-CM | POA: Diagnosis not present

## 2021-01-19 DIAGNOSIS — I6203 Nontraumatic chronic subdural hemorrhage: Secondary | ICD-10-CM | POA: Diagnosis not present

## 2021-01-19 DIAGNOSIS — E43 Unspecified severe protein-calorie malnutrition: Secondary | ICD-10-CM | POA: Diagnosis not present

## 2021-01-19 DIAGNOSIS — Z7902 Long term (current) use of antithrombotics/antiplatelets: Secondary | ICD-10-CM | POA: Diagnosis not present

## 2021-01-19 DIAGNOSIS — Z7189 Other specified counseling: Secondary | ICD-10-CM | POA: Diagnosis not present

## 2021-01-19 DIAGNOSIS — S065X9A Traumatic subdural hemorrhage with loss of consciousness of unspecified duration, initial encounter: Secondary | ICD-10-CM | POA: Diagnosis not present

## 2021-01-19 DIAGNOSIS — I214 Non-ST elevation (NSTEMI) myocardial infarction: Secondary | ICD-10-CM | POA: Diagnosis not present

## 2021-01-19 DIAGNOSIS — Z794 Long term (current) use of insulin: Secondary | ICD-10-CM | POA: Diagnosis not present

## 2021-01-19 DIAGNOSIS — Z86010 Personal history of colonic polyps: Secondary | ICD-10-CM | POA: Diagnosis not present

## 2021-01-19 DIAGNOSIS — Z7982 Long term (current) use of aspirin: Secondary | ICD-10-CM | POA: Diagnosis not present

## 2021-01-19 DIAGNOSIS — I447 Left bundle-branch block, unspecified: Secondary | ICD-10-CM | POA: Diagnosis present

## 2021-01-19 DIAGNOSIS — I6523 Occlusion and stenosis of bilateral carotid arteries: Secondary | ICD-10-CM | POA: Diagnosis not present

## 2021-01-19 DIAGNOSIS — Z515 Encounter for palliative care: Secondary | ICD-10-CM | POA: Diagnosis not present

## 2021-01-19 DIAGNOSIS — I5022 Chronic systolic (congestive) heart failure: Secondary | ICD-10-CM | POA: Diagnosis not present

## 2021-01-19 DIAGNOSIS — E1122 Type 2 diabetes mellitus with diabetic chronic kidney disease: Secondary | ICD-10-CM | POA: Diagnosis not present

## 2021-01-19 DIAGNOSIS — Z8249 Family history of ischemic heart disease and other diseases of the circulatory system: Secondary | ICD-10-CM | POA: Diagnosis not present

## 2021-01-19 DIAGNOSIS — F17210 Nicotine dependence, cigarettes, uncomplicated: Secondary | ICD-10-CM | POA: Diagnosis present

## 2021-01-19 DIAGNOSIS — R64 Cachexia: Secondary | ICD-10-CM | POA: Diagnosis not present

## 2021-01-19 DIAGNOSIS — Z89431 Acquired absence of right foot: Secondary | ICD-10-CM | POA: Diagnosis not present

## 2021-01-19 DIAGNOSIS — R634 Abnormal weight loss: Secondary | ICD-10-CM | POA: Diagnosis not present

## 2021-01-19 DIAGNOSIS — N184 Chronic kidney disease, stage 4 (severe): Secondary | ICD-10-CM | POA: Diagnosis not present

## 2021-01-19 DIAGNOSIS — Z8673 Personal history of transient ischemic attack (TIA), and cerebral infarction without residual deficits: Secondary | ICD-10-CM | POA: Diagnosis not present

## 2021-01-19 DIAGNOSIS — E785 Hyperlipidemia, unspecified: Secondary | ICD-10-CM | POA: Diagnosis present

## 2021-01-19 DIAGNOSIS — Z20822 Contact with and (suspected) exposure to covid-19: Secondary | ICD-10-CM | POA: Diagnosis not present

## 2021-01-19 DIAGNOSIS — R220 Localized swelling, mass and lump, head: Secondary | ICD-10-CM | POA: Diagnosis not present

## 2021-01-19 DIAGNOSIS — I5043 Acute on chronic combined systolic (congestive) and diastolic (congestive) heart failure: Secondary | ICD-10-CM | POA: Diagnosis not present

## 2021-01-19 DIAGNOSIS — I428 Other cardiomyopathies: Secondary | ICD-10-CM | POA: Diagnosis not present

## 2021-01-19 DIAGNOSIS — I4891 Unspecified atrial fibrillation: Secondary | ICD-10-CM | POA: Diagnosis present

## 2021-01-19 DIAGNOSIS — G319 Degenerative disease of nervous system, unspecified: Secondary | ICD-10-CM | POA: Diagnosis not present

## 2021-01-19 DIAGNOSIS — Z833 Family history of diabetes mellitus: Secondary | ICD-10-CM | POA: Diagnosis not present

## 2021-01-19 LAB — ECHOCARDIOGRAM COMPLETE
Area-P 1/2: 3.21 cm2
MV M vel: 4.44 m/s
MV Peak grad: 78.9 mmHg
Radius: 0.3 cm
S' Lateral: 4.8 cm

## 2021-01-19 LAB — COMPREHENSIVE METABOLIC PANEL
ALT: 36 U/L (ref 0–44)
AST: 49 U/L — ABNORMAL HIGH (ref 15–41)
Albumin: 3.5 g/dL (ref 3.5–5.0)
Alkaline Phosphatase: 76 U/L (ref 38–126)
Anion gap: 12 (ref 5–15)
BUN: 49 mg/dL — ABNORMAL HIGH (ref 8–23)
CO2: 26 mmol/L (ref 22–32)
Calcium: 9.5 mg/dL (ref 8.9–10.3)
Chloride: 102 mmol/L (ref 98–111)
Creatinine, Ser: 2.57 mg/dL — ABNORMAL HIGH (ref 0.61–1.24)
GFR, Estimated: 25 mL/min — ABNORMAL LOW (ref >60)
Glucose, Bld: 220 mg/dL — ABNORMAL HIGH (ref 70–99)
Potassium: 4.9 mmol/L (ref 3.5–5.1)
Sodium: 140 mmol/L (ref 135–145)
Total Bilirubin: 0.9 mg/dL (ref 0.3–1.2)
Total Protein: 6.8 g/dL (ref 6.5–8.1)

## 2021-01-19 LAB — HEMOGLOBIN A1C
Hgb A1c MFr Bld: 7.9 % — ABNORMAL HIGH (ref 4.8–5.6)
Mean Plasma Glucose: 180.03 mg/dL

## 2021-01-19 LAB — BRAIN NATRIURETIC PEPTIDE: B Natriuretic Peptide: 3838.2 pg/mL — ABNORMAL HIGH (ref 0.0–100.0)

## 2021-01-19 LAB — LIPID PANEL
Cholesterol: 144 mg/dL (ref 0–200)
HDL: 43 mg/dL (ref >40)
LDL Cholesterol: 81 mg/dL (ref 0–99)
Total CHOL/HDL Ratio: 3.3 RATIO
Triglycerides: 98 mg/dL (ref <150)
VLDL: 20 mg/dL (ref 0–40)

## 2021-01-19 LAB — TSH: TSH: 1.962 u[IU]/mL (ref 0.350–4.500)

## 2021-01-19 LAB — TROPONIN I (HIGH SENSITIVITY)
Troponin I (High Sensitivity): 1187 ng/L (ref <18)
Troponin I (High Sensitivity): 1169 ng/L (ref <18)
Troponin I (High Sensitivity): 930 ng/L (ref <18)

## 2021-01-19 LAB — CBG MONITORING, ED
Glucose-Capillary: 235 mg/dL — ABNORMAL HIGH (ref 70–99)
Glucose-Capillary: 196 mg/dL — ABNORMAL HIGH (ref 70–99)

## 2021-01-19 MED ORDER — ONDANSETRON HCL 4 MG/2ML IJ SOLN: 4.0000 mg | Freq: Four times a day (QID) | INTRAMUSCULAR | Status: DC | PRN

## 2021-01-19 MED ORDER — SODIUM CHLORIDE 0.9 % IV SOLN: 250.0000 mL | INTRAVENOUS | Status: DC | PRN

## 2021-01-19 MED ORDER — ATORVASTATIN CALCIUM 40 MG PO TABS
40.0000 mg | ORAL_TABLET | Freq: Every day | ORAL | Status: DC
  Administered 2021-01-19 – 2021-01-22 (×4): 40 mg via ORAL
  Filled 2021-01-19 (×3): qty 1

## 2021-01-19 MED ORDER — NITROGLYCERIN 0.4 MG SL SUBL: 0.4000 mg | SUBLINGUAL_TABLET | SUBLINGUAL | Status: DC | PRN

## 2021-01-19 MED ORDER — CLOPIDOGREL BISULFATE 75 MG PO TABS: 75.0000 mg | ORAL_TABLET | Freq: Every day | ORAL | Status: DC

## 2021-01-19 MED ORDER — INSULIN ASPART 100 UNIT/ML ~~LOC~~ SOLN
0.0000 [IU] | Freq: Every day | SUBCUTANEOUS | Status: DC
  Administered 2021-01-21: 3 [IU] via SUBCUTANEOUS

## 2021-01-19 MED ORDER — SODIUM CHLORIDE 0.9% FLUSH
3.0000 mL | Freq: Two times a day (BID) | INTRAVENOUS | Status: DC
  Administered 2021-01-19 – 2021-01-21 (×6): 3 mL via INTRAVENOUS

## 2021-01-19 MED ORDER — INSULIN ASPART 100 UNIT/ML ~~LOC~~ SOLN
0.0000 [IU] | Freq: Three times a day (TID) | SUBCUTANEOUS | Status: DC
  Administered 2021-01-19 – 2021-01-20 (×2): 3 [IU] via SUBCUTANEOUS
  Administered 2021-01-20: 2 [IU] via SUBCUTANEOUS
  Administered 2021-01-21 (×2): 5 [IU] via SUBCUTANEOUS
  Administered 2021-01-21 – 2021-01-22 (×2): 2 [IU] via SUBCUTANEOUS

## 2021-01-19 MED ORDER — FUROSEMIDE 10 MG/ML IJ SOLN: 40.0000 mg | Freq: Two times a day (BID) | INTRAMUSCULAR | Status: DC

## 2021-01-19 MED ORDER — SODIUM CHLORIDE 0.9% FLUSH: 3.0000 mL | INTRAVENOUS | Status: DC | PRN

## 2021-01-19 MED ORDER — ACETAMINOPHEN 325 MG PO TABS: 650.0000 mg | ORAL_TABLET | ORAL | Status: DC | PRN

## 2021-01-19 MED ORDER — ENOXAPARIN SODIUM 30 MG/0.3ML ~~LOC~~ SOLN
30.0000 mg | SUBCUTANEOUS | Status: DC
  Filled 2021-01-19: qty 0.3

## 2021-01-19 MED ORDER — MIDODRINE HCL 5 MG PO TABS
5.0000 mg | ORAL_TABLET | Freq: Three times a day (TID) | ORAL | Status: DC
  Administered 2021-01-19 – 2021-01-22 (×10): 5 mg via ORAL
  Filled 2021-01-19 (×11): qty 1

## 2021-01-19 MED ORDER — ASPIRIN EC 81 MG PO TBEC: 81.0000 mg | DELAYED_RELEASE_TABLET | Freq: Every day | ORAL | Status: DC

## 2021-01-19 MED ORDER — TORSEMIDE 20 MG PO TABS
20.0000 mg | ORAL_TABLET | Freq: Every day | ORAL | Status: DC
  Administered 2021-01-19 – 2021-01-22 (×4): 20 mg via ORAL
  Filled 2021-01-19 (×3): qty 1

## 2021-01-19 NOTE — Progress Notes (Signed)
  Echocardiogram 2D Echocardiogram has been performed.  [name] 01/19/2021, 4:44 PM

## 2021-01-19 NOTE — Progress Notes (Addendum)
Inpatient Diabetes Program Recommendations  AACE/ADA: New Consensus Statement on Inpatient Glycemic Control (2015)  Target Ranges:  Prepandial:   less than 140 mg/dL      Peak postprandial:   less than 180 mg/dL (1-2 hours)      Critically ill patients:  140 - 180 mg/dL   Results for [name], [name] (MRN [id number]) as of 01/19/2021 10:18  Ref. Range 01/18/2021 22:41  Glucose Latest Ref Range: 70 - 99 mg/dL 220 (H)    To ED with NSTEMI/ Acute on chronic combined CHF/ Failure to thrive  History: DM, CVA, CKD, CHF  Home DM Meds: 70/30 Insulin per SSI??  Current Orders: Novolog Sensitive Correction Scale/ SSI (0-9 units) TID AC + HS    Note Novolog SSi to start at 12pm today  PCP: Dr. Dagmar Hait with [facility]  Current A1c in process  Placed call to pt's PCP Dr. Alice Reichert requested the office call me back with clarification on pt's prescribed home insulin regimen    --Will follow patient during hospitalization--  Wyn Quaker RN, MSN, CDE Diabetes Coordinator Inpatient Glycemic Control Team Team Pager: [phone] (8a-5p)

## 2021-01-19 NOTE — Consult Note (Addendum)
Cardiology Consultation:   Patient ID: [name] MRN: [id number]; DOB: 09-Jan-1941  Admit date: 01/18/2021 Date of Consult: 01/19/2021  PCP:  Peter Solian, MD   [place]  Cardiologist:  No primary care provider on file.  Electrophysiologist:  None  Advanced Heart Failure Clinic:  Peter Bickers, MD    {  Patient Profile:   [name] is a 80 y.o. 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  History of Present Illness:   [name] is a 80 year old male with the above history who is followed by [name]. Patient has a long history of systolic CHF with biventricular failure due to non-ischemic cardiomyopathy. He was admitted in 01/2019 with acute on chronic biventricular failure and right upper lobe pneumonia. Echo showed LVEF of 15-20% with severe RV dysfunction. R/LHC showed volume overload and high grade distal left main lesion. CT surgery and he was not felt to be a surgery candidate given severe RV dysfunction, advanced age, frailty, and CKD. Case was reviewed with [name] for possible left main stenting. It was noted that it was a highly calcified lesion and would probably required Impella support given severely reduced EF. Additionally, LV was down prior to CAD so it was not clear that PCI would improve this. Therefore, decision was made to medically treat. Symptoms improved markedly with diuresis. Most recent Echo in 03/2020 showed improvement of LVEF to 35-40% with global hypokinesis and grade 1 diastolic  dysfunction.  More recently he has struggled with ongoing lightheadedness and weakness which has limited GDMT. Coreg and Bidil had to be stopped and Torsemide had to be decreased to twice per week (Monday and Friday) due to orthostatic hypotension with systolic BP dropping from the 90's to 70's with standing. He was also started on Midodrine. This initially helped his symptoms but then they returned.   He presented to the ED on 12/14/2020 for increased dyspnea and dizziness. BNP was elevated at 2,600 and creatinine was up to 2.92 (baseline around 2.3). He was noted to be wheezing. Chest x-ray showed left lower lobe atelectasis vs infiltrate with small pleural effusion. Dyspnea felt to be a combination of COPD/CHF. He was given a breathing treatment and Prednisone with improvement and was told to follow-up with his Nephrologist. He was seen by his Nephrologist ([name]) on 12/28/2020 at which time his breathing was stable but he was still feeling very weak. He was felt to be mildly volume overloaded on exam and was told to increase his Torsemide to daily use.   He was recently seen by [name] on 01/10/2021 at which time he noted his appetite had improved but he was still very weak. Shortness of breath was stable. It was noted that he had lost a significant amount of weight over the last 4 month. He weighed 176 lbs about 1 year ago, 164 lbs two months ago, and 144 lbs at that visit. He was cachetic on exam and there was concern for underlying malignancy.  Chest/abdominal/pelvic CT was ordered. Torsemide was changed to every other day.  Patient's wife brought patient  to the ED yesterday due to continued weakness and poor appetite.  However, while in the ED, he reported 2 episodes in the past couple days where he all of a sudden felt like he could not breathe.  He states it felt like he was yawning and could not get it out. Both times, he hit his chest about 3 times and then the shortness of breath resolved.   Otherwise, he states his breathing has been stable.  He has chronic dyspnea on exertion but states this has not been any worse than usual.  He denies any orthopnea.  He notes rare PND.  No lower extremity edema.  He denies any chest pain.  He notes occasional palpitations and continues to have a lightheadedness/dizziness with position changes consistent with orthostatic hypotension.  However, the symptoms are stable.  No recent fever or illnesses.  He does have a mild cough.  He states he coughs a couple of times every day and attributes this to smoking. No abnormal bleeding in urine or stools.  He he does have significant weakness and balance issues.  He recently fell and hit his hip but denies hitting his head.  Wife notes he was a little confused in the ED and thought he was out of town.  Otherwise, no altered mental status.  In the ED, vitals stable. EKG showed normal sinus rhythm, 95 bpm, with known LBBB. High-sensitivity troponin 1,187 >> 1,169 >> 930. Chest x-ray showed small left pleural effusion with left mid to lower lung field atelectasis vs developing infiltrate. WBC 7.1, Hgb 13.1, Plts 201. Na 140, K 4.9, Glucose 220, BUN 49, Cr 2.57. Albumin 3.5, AST 49, ALT 36, Alk Phos 76, Total Bili 0.9. He was admitted and Cardiology consulted for further evaluation.   Past Medical History:  Diagnosis Date  . Allergy   . Atrial fibrillation ([place])   . Cataract   . CHF (congestive heart failure) ([place])   . Chronic kidney disease    Renal Insuffiecny  . Colon polyps 2012   Colonoscopy  . Diabetes mellitus    type 2  . Diverticulosis 2012   Colonoscopy   . Hyperglycemia 04/2019  . Hyperlipidemia   . Hypertension   . LBBB (left bundle branch block)   . Pneumonia 2015   3 times  . Stroke [facility]) 2015    Past Surgical History:  Procedure Laterality Date  . AMPUTATION Right 04/28/2016   Procedure: Right Transmetatarsal Amputation;  Surgeon: Newt Minion, MD;  Location: [facility];  Service:  Orthopedics;  Laterality: Right;  . CATARACT EXTRACTION Bilateral    both eyes  . COLON SURGERY     to repair intestines as child  . COLONOSCOPY  10/20/2011   Procedure: COLONOSCOPY;  Surgeon: Inda Castle, MD;  Location: [facility];  Service: Endoscopy;  Laterality: N/A;  . FEMORAL-POPLITEAL BYPASS GRAFT Right 04/07/2016   Procedure: BYPASS GRAFT FEMORAL-POPLITEAL ARTERY-RIGHT;  Surgeon: Angelia Mould, MD;  Location: [facility];  Service: Vascular;  Laterality: Right;  . HOT HEMOSTASIS  10/20/2011   Procedure: HOT HEMOSTASIS (ARGON PLASMA COAGULATION/BICAP);  Surgeon: Inda Castle, MD;  Location: [facility];  Service: Endoscopy;  Laterality: N/A;  . KNEE ARTHROSCOPY Left    left  . NECK SURGERY     disectomy  . PERIPHERAL VASCULAR CATHETERIZATION Right 04/05/2016   Procedure: Lower Extremity Angiography;  Surgeon: Rosetta Posner, MD;  Location: [place] [facility];  Service: Cardiovascular;  Laterality: Right;  . PERIPHERAL VASCULAR CATHETERIZATION  N/A 04/05/2016   Procedure: Abdominal Aortogram;  Surgeon: Rosetta Posner, MD;  Location: [place] [facility];  Service: Cardiovascular;  Laterality: N/A;  . RIGHT/LEFT HEART CATH AND CORONARY ANGIOGRAPHY N/A 02/07/2019   Procedure: RIGHT/LEFT HEART CATH AND CORONARY ANGIOGRAPHY;  Surgeon: Jolaine Artist, MD;  Location: [place] [facility];  Service: Cardiovascular;  Laterality: N/A;  . VEIN HARVEST Right 04/07/2016   Procedure: VEIN HARVEST GREATER SAPHENOUS VIEN ;  Surgeon: Angelia Mould, MD;  Location: [place] [facility];  Service: Vascular;  Laterality: Right;     Home Medications:  Prior to Admission medications   Medication Sig Start Date End Date Taking? Authorizing Provider  aspirin EC 81 MG EC tablet Take 1 tablet (81 mg total) by mouth daily. 02/11/19   Mariel Aloe, MD  clopidogrel (PLAVIX) 75 MG tablet Take 75 mg by mouth daily.    [provider]  insulin aspart protamine- aspart (NOVOLOG MIX 70/30) (70-30) 100 UNIT/ML  injection Inject into the skin. Sliding scale    [provider]  Insulin Pen Needle 32G X 4 MM MISC Use as directed daily at bedtime. 05/23/19   Hongalgi, Lenis Dickinson, MD  iron polysaccharides (FERREX 150) 150 MG capsule Take 1 capsule (150 mg total) by mouth daily. 05/23/19   Hongalgi, Lenis Dickinson, MD  midodrine (PROAMATINE) 5 MG tablet Take 1 tablet (5 mg total) by mouth 3 (three) times daily with meals. 12/08/20   Bensimhon, Shaune Pascal, MD  Multiple Vitamin (MULTIVITAMIN WITH MINERALS) TABS tablet Take 1 tablet by mouth daily. 05/23/19   Hongalgi, Lenis Dickinson, MD  simvastatin (ZOCOR) 40 MG tablet Take 40 mg by mouth daily.    [provider]  torsemide (DEMADEX) 20 MG tablet Take 1 tablet (20 mg total) by mouth daily. 12/29/20   Bensimhon, Shaune Pascal, MD    Inpatient Medications: Scheduled Meds: . atorvastatin  40 mg Oral Daily  . furosemide  40 mg Intravenous BID  . insulin aspart  0-5 Units Subcutaneous QHS  . insulin aspart  0-9 Units Subcutaneous TID WC  . midodrine  5 mg Oral TID WC  . sodium chloride flush  3 mL Intravenous Q12H   Continuous Infusions: . sodium chloride     PRN Meds: sodium chloride, acetaminophen, nitroGLYCERIN, ondansetron (ZOFRAN) IV, sodium chloride flush  Allergies:    Allergies  Allergen Reactions  . Bee Venom Itching, Swelling and Other (See Comments)    Dizziness      Social History:   Social History   Socioeconomic History  . Marital status: Married    Spouse name: Not on file  . Number of children: 3  . Years of education: Not on file  . Highest education level: Not on file  Occupational History  . Occupation: Retired  Tobacco Use  . Smoking status: Current Every Day Smoker    Packs/day: 0.75    Years: 50.00    Pack years: 37.50    Types: Cigarettes  . Smokeless tobacco: Never Used  . Tobacco comment: 3 cigs/day currently  Vaping Use  . Vaping Use: Never used  Substance and Sexual Activity  . Alcohol use: Yes    Alcohol/week: 0.0  standard drinks    Comment: rarely  . Drug use: No  . Sexual activity: Not on file  Other Topics Concern  . Not on file  Social History Narrative  . Not on file   Social Determinants of Health   Financial Resource Strain: Not on file  Food Insecurity: No Food Insecurity  . Worried About Charity fundraiser in the Last Year: Never true  . Ran Out of Food in the Last Year: Never true  Transportation Needs: No Transportation Needs  . Lack of Transportation (Medical): No  . Lack of Transportation (Non-Medical): No  Physical Activity: Not on file  Stress: Not on file  Social Connections: Not on file  Intimate Partner Violence: Not on file    Family History:    Family History  Problem Relation Age of Onset  . Diabetes Mellitus II Mother   . Heart disease Brother        before age 22  . Colon cancer Neg Hx   . Esophageal cancer Neg Hx   . Stomach cancer Neg Hx   . Anesthesia problems Neg Hx   . Hypotension Neg Hx   . Malignant hyperthermia Neg Hx   . Pseudochol deficiency Neg Hx      ROS:  Please see the history of present illness.  Review of Systems  Constitutional: Positive for weight loss. Negative for chills and fever.  HENT: Negative for congestion.   Respiratory: Positive for cough and shortness of breath. Negative for hemoptysis.   Cardiovascular: Positive for palpitations and PND. Negative for chest pain, orthopnea and leg swelling.  Gastrointestinal: Negative for blood in stool, melena, nausea and vomiting.  Genitourinary: Negative for hematuria.  Musculoskeletal: Positive for falls. Negative for myalgias.  Neurological: Positive for dizziness. Negative for loss of consciousness.  Endo/Heme/Allergies: Does not bruise/bleed easily.  Psychiatric/Behavioral: Positive for substance abuse (tobacco abuse).     Physical Exam/Data:   Vitals:   01/19/21 0630 01/19/21 0730 01/19/21 0739 01/19/21 0900  BP: 125/80  122/86 124/85  Pulse: 96 93 94 93  Resp: 18 (!) '29  19 10  ' Temp:      TempSrc:      SpO2: 99% 95% 95% 94%   No intake or output data in the 24 hours ending 01/19/21 1231 Last 3 Weights 01/10/2021 12/14/2020 11/03/2020  Weight (lbs) 144 lb 3.2 oz 151 lb 164 lb 3.2 oz  Weight (kg) 65.409 kg 68.493 kg 74.481 kg     There is no height or weight on file to calculate BMI.  General: 80 y.o. cachectic African-American male resting comfortably in no acute distress.  HEENT: Normocephalic and atraumatic. Sclera clear.  Neck: Supple. No carotid bruits. Distended external jugular but no significant JVD. Heart: RRR. Distinct S1 and S2. No murmurs, gallops, or rubs.  Lungs: No increased work of breathing. Clear to ausculation bilaterally. No wheezes, rhonchi, or rales.  Abdomen: Soft, non-distended, and non-tender to palpation. Bowel sounds present. MSK: Normal strength and tone for age. Extremities: No lower extremity edema.    Skin: Warm and dry. Neuro: No focal deficits. Psych: Normal affect. Responds appropriately.  EKG:  The EKG was personally reviewed and demonstrates:  Normal sinus rhythm, rate 95 bpm, with known LBBB and right axis deviation.   Telemetry:  Telemetry was personally reviewed and demonstrates: Sinus rhythm with rate in the 90's to low 100's.  Relevant CV Studies:  Right/Left Cardiac Catheterization 02/07/2019:  Mid RCA to Dist RCA lesion is 50% stenosed.  Prox RCA lesion is 40% stenosed.  Mid LM to Dist LM lesion is 80% stenosed.  RPDA lesion is 90% stenosed.  Ost Cx to Prox Cx lesion is 80% stenosed.  Dist LM to Ost LAD lesion is 60% stenosed.  Prox Cx to Mid Cx lesion is 50% stenosed.  Prox LAD to Mid LAD lesion is 70% stenosed.   Findings: Ao = 136/76 (97) LV = 139/28 RA = 9 RV = 65/10 PA = 67/29 (48) PCW = 36 Fick cardiac output/index = 4.1/2.2 PVR = 3.0 WU SVR = 1721 Ao sat =  PA sat =  Assessment: 1. Severe 2v CAD with probable high grade, calcified distal left main lesion 2. EF 20-25% due to  iCM 3. Elevated filling pressures with moderately reduced CO  Plan/Discussion: Continue diuresis. Flow wire LM lesion next weak to confirm. Probable TCTS consult for CABG.  Diagnostic Dominance: Right   _______________  Echocardiogram 04/12/2020: Impressions: 1. Left ventricular ejection fraction, by estimation, is 35 to 40%. The  left ventricle has moderately decreased function. The left ventricle  demonstrates global hypokinesis. There is mild left ventricular  hypertrophy. Left ventricular diastolic  parameters are consistent with Grade I diastolic dysfunction (impaired  relaxation).  2. Right ventricular systolic function is normal. The right ventricular  size is normal. Tricuspid regurgitation signal is inadequate for assessing  PA pressure.  3. The mitral valve is normal in structure. Trivial mitral valve  regurgitation.  4. The aortic valve is tricuspid. Aortic valve regurgitation is trivial.  Mild to moderate aortic valve sclerosis/calcification is present, without  any evidence of aortic stenosis.   Laboratory Data:  High Sensitivity Troponin:   Recent Labs  Lab 01/19/21 0222 01/19/21 0405 01/19/21 0611  TROPONINIHS 1,187* 1,169* 930*     Chemistry Recent Labs  Lab 01/18/21 2241  NA 140  K 4.9  CL 102  CO2 26  GLUCOSE 220*  BUN 49*  CREATININE 2.57*  CALCIUM 9.5  GFRNONAA 25*  ANIONGAP 12    Recent Labs  Lab 01/18/21 2241  PROT 6.8  ALBUMIN 3.5  AST 49*  ALT 36  ALKPHOS 76  BILITOT 0.9   Hematology Recent Labs  Lab 01/18/21 2241  WBC 7.1  RBC 4.37  HGB 13.1  HCT 41.9  MCV 95.9  MCH 30.0  MCHC 31.3  RDW 14.6  PLT 201   BNPNo results for input(s): BNP, PROBNP in the last 168 hours.  DDimer No results for input(s): DDIMER in the last 168 hours.   Radiology/Studies:  CT ABDOMEN PELVIS WO CONTRAST  Result Date: 01/19/2021 CLINICAL DATA:  Shortness of breath. Unintentional weight loss. Tobacco use. EXAM: CT CHEST, ABDOMEN AND  PELVIS WITHOUT CONTRAST TECHNIQUE: Multidetector CT imaging of the chest, abdomen and pelvis was performed following the standard protocol without IV contrast. COMPARISON:  CT chest 04/01/2019 FINDINGS: CT CHEST FINDINGS Cardiovascular: Cardiac enlargement. No pericardial effusions. Prominent coronary artery and minimal aortic calcification. Normal caliber thoracic aorta. Mediastinum/Nodes: Esophagus is decompressed. Moderately prominent mediastinal lymph nodes without pathologic enlargement, likely reactive. Similar appearance to previous study. Lungs/Pleura: Small bilateral pleural effusions. Basilar atelectasis. Mild subpleural fibrosis in the lungs. Mild interstitial pattern likely due to edema. Patchy focal airspace disease in the right upper lung may represent multifocal pneumonia or early edema. No pneumothorax. Musculoskeletal: Degenerative changes in the thoracic spine. Normal alignment. No vertebral compression deformities. Sternum and ribs are nondepressed. CT ABDOMEN PELVIS FINDINGS Hepatobiliary: No focal liver abnormality is seen. No gallstones, gallbladder wall thickening, or biliary dilatation. Pancreas: Unremarkable. No pancreatic ductal dilatation or surrounding inflammatory changes. Spleen: Normal in size without focal abnormality. Adrenals/Urinary Tract: Left adrenal gland nodule with fat attenuation consistent with benign adenoma. No change since prior study. Kidneys are symmetrical in size. No hydronephrosis or hydroureter. No renal or ureteral stones. Bladder  is normal for degree of distention. Stomach/Bowel: Stomach is within normal limits. Appendix appears normal. No evidence of bowel wall thickening, distention, or inflammatory changes. Vascular/Lymphatic: Aortic atherosclerosis. No enlarged abdominal or pelvic lymph nodes. Reproductive: Prostate gland is mildly enlarged. Penile prosthesis with retention reservoir or in the anterior pelvis. Other: No free air or free fluid in the abdomen.  Lipoma in the subcutaneous fat superficial to the left flank musculature. Vascular calcifications in the groin regions. Surgical clips in the right groin. Musculoskeletal: No acute or significant osseous findings. IMPRESSION: 1. Cardiac enlargement with mild interstitial edema and small bilateral pleural effusions. 2. Patchy focal airspace disease in the right upper lung may represent multifocal pneumonia or early alveolar edema. 3. Mild subpleural fibrosis in the lungs. 4. Prominent mediastinal lymph nodes without pathologic enlargement, likely reactive. 5. Coronary artery and minimal aortic calcification. Aortic Atherosclerosis (ICD10-I70.0). Electronically Signed   By: Lucienne Capers M.D.   On: 01/19/2021 01:51   DG Chest 2 View  Result Date: 01/18/2021 CLINICAL DATA:  80 year old male with shortness of breath. EXAM: CHEST - 2 VIEW COMPARISON:  Chest radiograph dated 12/14/2020. FINDINGS: Faint left mid to lower lung field interstitial densities may represent atelectasis but concerning for developing infiltrate. There is a small left pleural effusion. No pneumothorax. Mild cardiomegaly. Atherosclerotic calcification of the aorta. No acute osseous pathology. Degenerative changes of the spine. IMPRESSION: Small left pleural effusion with left mid to lower lung field atelectasis versus developing infiltrate. Electronically Signed   By: Anner Crete M.D.   On: 01/18/2021 23:15   CT HEAD WO CONTRAST  Result Date: 01/19/2021 CLINICAL DATA:  Ataxia.  Recent CVA. EXAM: CT HEAD WITHOUT CONTRAST TECHNIQUE: Contiguous axial images were obtained from the base of the skull through the vertex without intravenous contrast. COMPARISON:  CT head without contrast 02/28/2016. FINDINGS: Brain: Bilateral intermediate density the extra-axial fluid collections are present over the convexities. Left-sided collection measures up to 12 mm on coronal images. Right-sided collection measures 7 mm maximally on coronal images.  There is some mass effect over the left parietal lobe with partial effacement sulci. No midline shift present. Moderate atrophy white matter disease is otherwise stable. Remote lacunar infarct noted in the right thalamus. White matter changes extend into the brainstem. Cerebellum is unremarkable. Vascular: Atherosclerotic calcifications present the cavernous internal carotid arteries. No hyperdense vessel is present. Skull: Calvarium is intact. No focal lytic or blastic lesions are present. No significant extracranial soft tissue lesion is present. Sinuses/Orbits: The paranasal sinuses and mastoid air cells are clear. Bilateral lens replacements are noted. Globes and orbits are otherwise unremarkable. IMPRESSION: 1. Bilateral intermediate density extra-axial fluid collections over the convexities most compatible with subacute or early chronic subdural hematomas. 2. Mass effect over the left parietal lobe with partial effacement of sulci. No midline shift. 3. Stable atrophy and white matter disease. 4. Remote lacunar infarct of the right thalamus. Critical Value/emergent results were called by telephone at the time of interpretation on 01/19/2021 at 11:40 am to provider [facility] , who verbally acknowledged these results. Electronically Signed   By: San Morelle M.D.   On: 01/19/2021 11:40   CT Chest Wo Contrast  Result Date: 01/19/2021 CLINICAL DATA:  Shortness of breath. Unintentional weight loss. Tobacco use. EXAM: CT CHEST, ABDOMEN AND PELVIS WITHOUT CONTRAST TECHNIQUE: Multidetector CT imaging of the chest, abdomen and pelvis was performed following the standard protocol without IV contrast. COMPARISON:  CT chest 04/01/2019 FINDINGS: CT CHEST FINDINGS Cardiovascular: Cardiac enlargement. No  pericardial effusions. Prominent coronary artery and minimal aortic calcification. Normal caliber thoracic aorta. Mediastinum/Nodes: Esophagus is decompressed. Moderately prominent mediastinal lymph nodes  without pathologic enlargement, likely reactive. Similar appearance to previous study. Lungs/Pleura: Small bilateral pleural effusions. Basilar atelectasis. Mild subpleural fibrosis in the lungs. Mild interstitial pattern likely due to edema. Patchy focal airspace disease in the right upper lung may represent multifocal pneumonia or early edema. No pneumothorax. Musculoskeletal: Degenerative changes in the thoracic spine. Normal alignment. No vertebral compression deformities. Sternum and ribs are nondepressed. CT ABDOMEN PELVIS FINDINGS Hepatobiliary: No focal liver abnormality is seen. No gallstones, gallbladder wall thickening, or biliary dilatation. Pancreas: Unremarkable. No pancreatic ductal dilatation or surrounding inflammatory changes. Spleen: Normal in size without focal abnormality. Adrenals/Urinary Tract: Left adrenal gland nodule with fat attenuation consistent with benign adenoma. No change since prior study. Kidneys are symmetrical in size. No hydronephrosis or hydroureter. No renal or ureteral stones. Bladder is normal for degree of distention. Stomach/Bowel: Stomach is within normal limits. Appendix appears normal. No evidence of bowel wall thickening, distention, or inflammatory changes. Vascular/Lymphatic: Aortic atherosclerosis. No enlarged abdominal or pelvic lymph nodes. Reproductive: Prostate gland is mildly enlarged. Penile prosthesis with retention reservoir or in the anterior pelvis. Other: No free air or free fluid in the abdomen. Lipoma in the subcutaneous fat superficial to the left flank musculature. Vascular calcifications in the groin regions. Surgical clips in the right groin. Musculoskeletal: No acute or significant osseous findings. IMPRESSION: 1. Cardiac enlargement with mild interstitial edema and small bilateral pleural effusions. 2. Patchy focal airspace disease in the right upper lung may represent multifocal pneumonia or early alveolar edema. 3. Mild subpleural fibrosis in  the lungs. 4. Prominent mediastinal lymph nodes without pathologic enlargement, likely reactive. 5. Coronary artery and minimal aortic calcification. Aortic Atherosclerosis (ICD10-I70.0). Electronically Signed   By: Lucienne Capers M.D.   On: 01/19/2021 01:51     Assessment and Plan:   Elevated Troponin  History of CAD - Patient has history of CAD with known high-grade distal left main disease. Not felt to be CABG or PCI candidate due to other comorbidities.   - Patient presented with continued weakness and poor appetite as well as 2 brief episodes of shortness of breath and was found to have elevated high-sensitivity troponin. - High-sensitivity troponin peaked at 1,187 and down-trending. - Patient denies any chest pain - Echo ordered. - No IV Heparin due to downtrending troponin and subdural hematoma. - Unclear exactly what caused marekd troponin elevation. Likely secondary to known severe CAD. Also question role of subdural hematoma in this. Regardless, not a candidate for any intervention given CKD and subdural hematura. Will hold DAPT until Neurosurgery can see. Continue statin.  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 RV normal in size with moderately reduced systolic function. Most recent Echo in 03/2020 showed some improvement in EF to 35-40%. RV systolic function normal at that time. - BNP pending. - Chest x-ray showed small left pleural effusion with left mid to lower lung field atelectasis vs developing infiltrate. Chest CT showed card mild interstitial edema and small bilateral pleural effusion as well as patchy focal airspace disease in the right upper lung which may represent pneumonia or early alveolar edema. - Repeat Echo ordered. - Patient does not appear volume overloaded on exam to me. - Primary team has ordered IV Lasix 61m twice daily but I think we can likely continue home Torsemide. Will discuss  with MD. -  No ACE/ARB/ARNI or MRA due to renal function.  - Previously on Coreg and Bidil but this had to be stopped due to low BP. - No SGLT2 inhibitor due to renal function and weight loss.  Questionable History of Atrial Fibrillation - Patient has atrial fibrillation listed under past medical history but I do not see any documentation of this in Cardiology notes. - No documented atrial fibrillation on any EKGs in our system.  Orthostatic Hypotension - Stable.  - Continue home Midodrine 62m three times daily.  Hyperlipidemia - On Simvastatin 441mdaily at home but place on Lipitor 4034maily here.  Type 2 Diabetes Mellitus - Management per primary team.  CKD Stage IV - Creatinine 2.57 on admission. Baseline around 2.2 to 2.4. - Continue to monitor.  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 - Will stop DAPT for now. - Neurosurgery has been consulted.  Unexplained Weight Loss - Patient has lost 20 lbs over the last 2 months. There was concern for underlying malignancy.  - Chest/abdominal/pelvic CT showed prominent mediastinal lymph nodes but no obvious malignancy.  - Palliative Care has been consulted.  Tobacco Abuse - Patient continues to smoke. - Complete cessation is recommended.  Risk Assessment/Risk Scores:   New York Heart Association (NYHA) Functional Class NYHA Class III  For questions or updates, please contact [facility] Please consult [email or web address] for contact info under    Signed, CalEppie Gibson/27/2022 12:31 PM  Patient seen with PA, agree with the above note.   Extensive past history as outlined above.  Patient has lost considerable weight recently.  He has been essentially bed-bound per his wife, legs are "weak."  He fell about a week ago, but reports he his his hip and not his head.    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  Has some atrophy, mass effect over L parietal lobe. CT chest abdomen pelvis showed patchy RUL airspace disease, no other worrisome abnormality.   General: NAD, thin Neck: No JVD, no thyromegaly or thyroid nodule.  Lungs: Clear.  CV: Lateral PMI.  Heart regular S1/S2, no S3/S4, no murmur.  No peripheral edema.  No carotid bruit.  Difficult to palpate pedal pulses.  Abdomen: Soft, nontender, no hepatosplenomegaly, no distention.  Skin: Intact without lesions or rashes.  Neurologic: Alert and oriented x 3.  Psych: Normal affect. Extremities: No clubbing or cyanosis.  HEENT: Normal.   Failure to thrive with significant weight loss.  No malignancy identified on CT chest/abdomen/pelvis this admission.    Main complaint had been leg weakness/difficulty getting out of bed.  CT head with bilateral subacute subdural hematomas may be contributing to this.  Neurosurgery has been consulted, will need to hold aspirin and Plavix for now.   On exam, I do not think that he is markedly volume overloaded.  GDMT limited by low BP/orthostasis.  Continue torsemide 20 mg daily and midodrine.   Elevated troponin but no chest pain.  Does not appear markedly volume overloaded.  ?Elevation due to neurological event (bilateral subdurals).  Has known severe 3VD including LM disease, not candidate for CABG or PCI.  With elevated creatinine to 2.57 and bilateral SDHs, no anticoagulation or cath planned.   Palliative care consultation would be appropriate.   DalLoralie Champagne27/2022 1:34 PM

## 2021-01-19 NOTE — ED Notes (Signed)
Echo at bedside

## 2021-01-19 NOTE — ED Notes (Addendum)
First encounter with patient. Patient resting in cart. Agreeable to plan at this time. Re educated on use of call light. No other needs. Per day shift RN, all medications were given as scheduled, however did not show up in Epic.

## 2021-01-19 NOTE — ED Notes (Signed)
Patient wife asking about nebulizer treatments, state that he has them at home Q14H.

## 2021-01-19 NOTE — ED Notes (Signed)
Patient ambulated. 02 dropped down to 97%. Pt denies SOB/chest pain.

## 2021-01-19 NOTE — Progress Notes (Signed)
STAT page from Dr. Jobe Igo from radiology re: head CT ordered this AM.   B subdural collections noted, likely subacute/early chronic subdural hematomas.  Has some atrophy, mass effect over L parietal lobe.    Will stop Lovenox, continue ASA and Plavix for now.  Adding SCDs.  Cardiology notified, given concern for ACS as well.  Will consult neurosurgery; office notified of consult request for Dr. Christella Noa.    Carlyon Shadow, M.D.

## 2021-01-19 NOTE — Consult Note (Signed)
Reason for Consult: subdural hematomas Referring Physician: [name], [name] is an 80 y.o. male.  HPI: whom was admitted for shortness of breath. Head ct revealed bilateral subdural hematomas. 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   Past Medical History:  Diagnosis Date  . Allergy   . Atrial fibrillation ([place])   . Cataract   . CHF (congestive heart failure) ([place])   . Chronic kidney disease    Renal Insuffiecny  . Colon polyps 2012   Colonoscopy  . Diabetes mellitus    type 2  . Diverticulosis 2012   Colonoscopy   . Hyperglycemia 04/2019  . Hyperlipidemia   . Hypertension   . LBBB (left bundle branch block)   . Pneumonia 2015   3 times  . Stroke [facility]) 2015    Past Surgical History:  Procedure Laterality Date  . AMPUTATION Right 04/28/2016   Procedure: Right Transmetatarsal Amputation;  Surgeon: Newt Minion, MD;  Location: [place];  Service: Orthopedics;  Laterality: Right;  . CATARACT EXTRACTION Bilateral    both eyes  . COLON SURGERY     to repair intestines as child  . COLONOSCOPY  10/20/2011   Procedure: COLONOSCOPY;  Surgeon: Inda Castle, MD;  Location: [facility];  Service: Endoscopy;  Laterality: N/A;  . FEMORAL-POPLITEAL BYPASS GRAFT Right 04/07/2016   Procedure: BYPASS GRAFT FEMORAL-POPLITEAL ARTERY-RIGHT;  Surgeon: Angelia Mould, MD;  Location: [place];  Service: Vascular;  Laterality: Right;  . HOT HEMOSTASIS  10/20/2011   Procedure: HOT HEMOSTASIS (ARGON PLASMA COAGULATION/BICAP);  Surgeon: Inda Castle, MD;  Location: [facility];  Service: Endoscopy;  Laterality: N/A;  . KNEE ARTHROSCOPY Left    left  . NECK SURGERY     disectomy  . PERIPHERAL VASCULAR CATHETERIZATION Right 04/05/2016   Procedure: Lower Extremity Angiography;  Surgeon: Rosetta Posner, MD;  Location: [place] [facility];  Service: Cardiovascular;  Laterality: Right;  . PERIPHERAL VASCULAR CATHETERIZATION N/A 04/05/2016   Procedure: Abdominal Aortogram;   Surgeon: Rosetta Posner, MD;  Location: [place] [facility];  Service: Cardiovascular;  Laterality: N/A;  . RIGHT/LEFT HEART CATH AND CORONARY ANGIOGRAPHY N/A 02/07/2019   Procedure: RIGHT/LEFT HEART CATH AND CORONARY ANGIOGRAPHY;  Surgeon: Jolaine Artist, MD;  Location: [place] [facility];  Service: Cardiovascular;  Laterality: N/A;  . VEIN HARVEST Right 04/07/2016   Procedure: VEIN HARVEST GREATER SAPHENOUS VIEN ;  Surgeon: Angelia Mould, MD;  Location: [facility];  Service: Vascular;  Laterality: Right;    Family History  Problem Relation Age of Onset  . Diabetes Mellitus II Mother   . Heart disease Brother        before age 51  . Colon cancer Neg Hx   . Esophageal cancer Neg Hx   . Stomach cancer Neg Hx   . Anesthesia problems Neg Hx   . Hypotension Neg Hx   . Malignant hyperthermia Neg Hx   . Pseudochol deficiency Neg Hx     Social History:  reports that he has been smoking cigarettes. He has a 37.50 pack-year smoking history. He has never used smokeless tobacco. He reports current alcohol use. He reports that he does not use drugs.  Allergies:  Allergies  Allergen Reactions  . Bee Venom Itching, Swelling and Other (See Comments)    Dizziness      Medications: I have reviewed the patient's current medications.  Results for orders placed or performed during the hospital encounter of 01/18/21 (from the  past 48 hour(s))  Comprehensive metabolic panel     Status: Abnormal   Collection Time: 01/18/21 10:41 PM  Result Value Ref [place]   Sodium 140 135 - 145 mmol/L   Potassium 4.9 3.5 - 5.1 mmol/L   Chloride 102 98 - 111 mmol/L   CO2 26 22 - 32 mmol/L   Glucose, Bld 220 (H) 70 - 99 mg/dL    Comment: Glucose reference range applies only to samples taken after fasting for at least 8 hours.   BUN 49 (H) 8 - 23 mg/dL   Creatinine, Ser 2.57 (H) 0.61 - 1.24 mg/dL   Calcium 9.5 8.9 - 10.3 mg/dL   Total Protein 6.8 6.5 - 8.1 g/dL   Albumin 3.5 3.5 - 5.0 g/dL   AST 49 (H) 15 -  41 U/L   ALT 36 0 - 44 U/L   Alkaline Phosphatase 76 38 - 126 U/L   Total Bilirubin 0.9 0.3 - 1.2 mg/dL   GFR, Estimated 25 (L) >60 mL/min    Comment: (NOTE) Calculated using the CKD-EPI Creatinine Equation (2021)    Anion gap 12 5 - 15    Comment: Performed at [facility] [address] [place], [place] [address]  CBC with Differential     Status: None   Collection Time: 01/18/21 10:41 PM  Result Value Ref [place]   WBC 7.1 4.0 - 10.5 K/uL   RBC 4.37 4.22 - 5.81 MIL/uL   Hemoglobin 13.1 13.0 - 17.0 g/dL   HCT 41.9 39.0 - 52.0 %   MCV 95.9 80.0 - 100.0 fL   MCH 30.0 26.0 - 34.0 pg   MCHC 31.3 30.0 - 36.0 g/dL   RDW 14.6 11.5 - 15.5 %   Platelets 201 150 - 400 K/uL   nRBC 0.0 0.0 - 0.2 %   Neutrophils Relative % 67 %   Neutro Abs 4.8 1.7 - 7.7 K/uL   Lymphocytes Relative 20 %   Lymphs Abs 1.4 0.7 - 4.0 K/uL   Monocytes Relative 10 %   Monocytes Absolute 0.7 0.1 - 1.0 K/uL   Eosinophils Relative 3 %   Eosinophils Absolute 0.2 0.0 - 0.5 K/uL   Basophils Relative 0 %   Basophils Absolute 0.0 0.0 - 0.1 K/uL   Immature Granulocytes 0 %   Abs Immature Granulocytes 0.03 0.00 - 0.07 K/uL    Comment: Performed at [facility], [address], [place], [place] [address]  Urinalysis, Routine w reflex microscopic Urine, Clean Catch     Status: Abnormal   Collection Time: 01/18/21 10:43 PM  Result Value Ref [place]   Color, Urine YELLOW YELLOW   APPearance HAZY (A) CLEAR   Specific Gravity, Urine 1.019 1.005 - 1.030   pH 5.0 5.0 - 8.0   Glucose, UA NEGATIVE NEGATIVE mg/dL   Hgb urine dipstick NEGATIVE NEGATIVE   Bilirubin Urine NEGATIVE NEGATIVE   Ketones, ur NEGATIVE NEGATIVE mg/dL   Protein, ur 100 (A) NEGATIVE mg/dL   Nitrite NEGATIVE NEGATIVE   Leukocytes,Ua NEGATIVE NEGATIVE   RBC / HPF 0-5 0 - 5 RBC/hpf   WBC, UA 0-5 0 - 5 WBC/hpf   Bacteria, UA NONE SEEN NONE SEEN   Squamous Epithelial / LPF 0-5 0 - 5   Mucus PRESENT     Comment: Performed at [facility], [place] [address], [place], [place] [address]  Troponin I (High Sensitivity)     Status: Abnormal   Collection Time: 01/19/21  2:22 AM  Result Value Ref [place]   Troponin I (High Sensitivity) 1,187 (HH) <18 ng/L    Comment: CRITICAL RESULT CALLED TO, READ BACK BY AND VERIFIED WITH: J. Nada Maclachlan, RN. [phone] 01/19/21 A. MCDOWELL (NOTE) Elevated high sensitivity troponin I (hsTnI) values and significant  changes across serial measurements may suggest ACS but many other  chronic and acute conditions are known to elevate hsTnI results.  Refer to the Links section for chest pain algorithms and additional  guidance. Performed at [facility], [place] [address], [place], [place] [address]   Troponin I (High Sensitivity)     Status: Abnormal   Collection Time: 01/19/21  4:05 AM  Result Value Ref [place]   Troponin I (High Sensitivity) 1,169 (HH) <18 ng/L    Comment: CRITICAL VALUE NOTED.  VALUE IS CONSISTENT WITH PREVIOUSLY REPORTED AND CALLED VALUE. (NOTE) Elevated high sensitivity troponin I (hsTnI) values and significant  changes across serial measurements may suggest ACS but many other  chronic and acute conditions are known to elevate hsTnI results.  Refer to the Links section for chest pain algorithms and additional  guidance. Performed at [facility], [place] [address] [place], [place] [address]   Troponin I (High Sensitivity)     Status: Abnormal   Collection Time: 01/19/21  6:11 AM  Result Value Ref [place]   Troponin I (High Sensitivity) 930 (HH) <18 ng/L    Comment: CRITICAL VALUE NOTED.  VALUE IS CONSISTENT WITH PREVIOUSLY REPORTED AND CALLED VALUE. (NOTE) Elevated high sensitivity troponin I (hsTnI) values and significant  changes across serial measurements may suggest ACS but many other  chronic and acute conditions are known to elevate hsTnI results.  Refer to the Links section for chest pain algorithms and additional  guidance. Performed at [facility],  [place] [address], [place], [place] [address]   TSH     Status: None   Collection Time: 01/19/21  9:50 AM  Result Value Ref [place]   TSH 1.962 0.350 - 4.500 uIU/mL    Comment: Performed by a 3rd Generation assay with a functional sensitivity of <=0.01 uIU/mL. Performed at [facility], [place] [address] [place], [place] [address]   Hemoglobin A1c     Status: Abnormal   Collection Time: 01/19/21  9:50 AM  Result Value Ref [place]   Hgb A1c MFr Bld 7.9 (H) 4.8 - 5.6 %    Comment: (NOTE) Pre diabetes:          5.7%-6.4%  Diabetes:              >6.4%  Glycemic control for   <7.0% adults with diabetes    Mean Plasma Glucose 180.03 mg/dL    Comment: Performed at [facility] [address], [place],  [address]  Lipid panel     Status: None   Collection Time: 01/19/21  9:51 AM  Result Value Ref [place]   Cholesterol 144 0 - 200 mg/dL   Triglycerides 98 <150 mg/dL   HDL 43 >40 mg/dL   Total CHOL/HDL Ratio 3.3 RATIO   VLDL 20 0 - 40 mg/dL   LDL Cholesterol 81 0 - 99 mg/dL    Comment:        Total Cholesterol/HDL:CHD Risk Coronary Heart Disease Risk Table                     Men   Women  1/2 Average Risk   3.4  3.3  Average Risk       5.0   4.4  2 X Average Risk   9.6   7.1  3 X Average Risk  23.4   11.0        Use the calculated Patient Ratio above and the CHD Risk Table to determine the patient's CHD Risk.        ATP III CLASSIFICATION (LDL):  <100     mg/dL   Optimal  100-129  mg/dL   Near or Above                    Optimal  130-159  mg/dL   Borderline  160-189  mg/dL   High  >190     mg/dL   Very High Performed at [facility] [address] [place], [place] [address]   Brain natriuretic peptide     Status: Abnormal   Collection Time: 01/19/21  9:54 AM  Result Value Ref [place]   B Natriuretic Peptide 3,838.2 (H) 0.0 - 100.0 pg/mL    Comment: Performed at [facility] [address], [place], [place] [address]    CT ABDOMEN PELVIS WO  CONTRAST  Result Date: 01/19/2021 CLINICAL DATA:  Shortness of breath. Unintentional weight loss. Tobacco use. EXAM: CT CHEST, ABDOMEN AND PELVIS WITHOUT CONTRAST TECHNIQUE: Multidetector CT imaging of the chest, abdomen and pelvis was performed following the standard protocol without IV contrast. COMPARISON:  CT chest 04/01/2019 FINDINGS: CT CHEST FINDINGS Cardiovascular: Cardiac enlargement. No pericardial effusions. Prominent coronary artery and minimal aortic calcification. Normal caliber thoracic aorta. Mediastinum/Nodes: Esophagus is decompressed. Moderately prominent mediastinal lymph nodes without pathologic enlargement, likely reactive. Similar appearance to previous study. Lungs/Pleura: Small bilateral pleural effusions. Basilar atelectasis. Mild subpleural fibrosis in the lungs. Mild interstitial pattern likely due to edema. Patchy focal airspace disease in the right upper lung may represent multifocal pneumonia or early edema. No pneumothorax. Musculoskeletal: Degenerative changes in the thoracic spine. Normal alignment. No vertebral compression deformities. Sternum and ribs are nondepressed. CT ABDOMEN PELVIS FINDINGS Hepatobiliary: No focal liver abnormality is seen. No gallstones, gallbladder wall thickening, or biliary dilatation. Pancreas: Unremarkable. No pancreatic ductal dilatation or surrounding inflammatory changes. Spleen: Normal in size without focal abnormality. Adrenals/Urinary Tract: Left adrenal gland nodule with fat attenuation consistent with benign adenoma. No change since prior study. Kidneys are symmetrical in size. No hydronephrosis or hydroureter. No renal or ureteral stones. Bladder is normal for degree of distention. Stomach/Bowel: Stomach is within normal limits. Appendix appears normal. No evidence of bowel wall thickening, distention, or inflammatory changes. Vascular/Lymphatic: Aortic atherosclerosis. No enlarged abdominal or pelvic lymph nodes. Reproductive: Prostate gland  is mildly enlarged. Penile prosthesis with retention reservoir or in the anterior pelvis. Other: No free air or free fluid in the abdomen. Lipoma in the subcutaneous fat superficial to the left flank musculature. Vascular calcifications in the groin regions. Surgical clips in the right groin. Musculoskeletal: No acute or significant osseous findings. IMPRESSION: 1. Cardiac enlargement with mild interstitial edema and small bilateral pleural effusions. 2. Patchy focal airspace disease in the right upper lung may represent multifocal pneumonia or early alveolar edema. 3. Mild subpleural fibrosis in the lungs. 4. Prominent mediastinal lymph nodes without pathologic enlargement, likely reactive. 5. Coronary artery and minimal aortic calcification. Aortic Atherosclerosis (ICD10-I70.0). Electronically Signed   By: Lucienne Capers M.D.   On: 01/19/2021 01:51   DG Chest 2 View  Result Date: 01/18/2021 CLINICAL DATA:  80 year old male with  shortness of breath. EXAM: CHEST - 2 VIEW COMPARISON:  Chest radiograph dated 12/14/2020. FINDINGS: Faint left mid to lower lung field interstitial densities may represent atelectasis but concerning for developing infiltrate. There is a small left pleural effusion. No pneumothorax. Mild cardiomegaly. Atherosclerotic calcification of the aorta. No acute osseous pathology. Degenerative changes of the spine. IMPRESSION: Small left pleural effusion with left mid to lower lung field atelectasis versus developing infiltrate. Electronically Signed   By: Anner Crete M.D.   On: 01/18/2021 23:15   CT HEAD WO CONTRAST  Result Date: 01/19/2021 CLINICAL DATA:  Ataxia.  Recent CVA. EXAM: CT HEAD WITHOUT CONTRAST TECHNIQUE: Contiguous axial images were obtained from the base of the skull through the vertex without intravenous contrast. COMPARISON:  CT head without contrast 02/28/2016. FINDINGS: Brain: Bilateral intermediate density the extra-axial fluid collections are present over the  convexities. Left-sided collection measures up to 12 mm on coronal images. Right-sided collection measures 7 mm maximally on coronal images. There is some mass effect over the left parietal lobe with partial effacement sulci. No midline shift present. Moderate atrophy white matter disease is otherwise stable. Remote lacunar infarct noted in the right thalamus. White matter changes extend into the brainstem. Cerebellum is unremarkable. Vascular: Atherosclerotic calcifications present the cavernous internal carotid arteries. No hyperdense vessel is present. Skull: Calvarium is intact. No focal lytic or blastic lesions are present. No significant extracranial soft tissue lesion is present. Sinuses/Orbits: The paranasal sinuses and mastoid air cells are clear. Bilateral lens replacements are noted. Globes and orbits are otherwise unremarkable. IMPRESSION: 1. Bilateral intermediate density extra-axial fluid collections over the convexities most compatible with subacute or early chronic subdural hematomas. 2. Mass effect over the left parietal lobe with partial effacement of sulci. No midline shift. 3. Stable atrophy and white matter disease. 4. Remote lacunar infarct of the right thalamus. Critical Value/emergent results were called by telephone at the time of interpretation on 01/19/2021 at 11:40 am to provider [facility] , who verbally acknowledged these results. Electronically Signed   By: San Morelle M.D.   On: 01/19/2021 11:40   CT Chest Wo Contrast  Result Date: 01/19/2021 CLINICAL DATA:  Shortness of breath. Unintentional weight loss. Tobacco use. EXAM: CT CHEST, ABDOMEN AND PELVIS WITHOUT CONTRAST TECHNIQUE: Multidetector CT imaging of the chest, abdomen and pelvis was performed following the standard protocol without IV contrast. COMPARISON:  CT chest 04/01/2019 FINDINGS: CT CHEST FINDINGS Cardiovascular: Cardiac enlargement. No pericardial effusions. Prominent coronary artery and minimal aortic  calcification. Normal caliber thoracic aorta. Mediastinum/Nodes: Esophagus is decompressed. Moderately prominent mediastinal lymph nodes without pathologic enlargement, likely reactive. Similar appearance to previous study. Lungs/Pleura: Small bilateral pleural effusions. Basilar atelectasis. Mild subpleural fibrosis in the lungs. Mild interstitial pattern likely due to edema. Patchy focal airspace disease in the right upper lung may represent multifocal pneumonia or early edema. No pneumothorax. Musculoskeletal: Degenerative changes in the thoracic spine. Normal alignment. No vertebral compression deformities. Sternum and ribs are nondepressed. CT ABDOMEN PELVIS FINDINGS Hepatobiliary: No focal liver abnormality is seen. No gallstones, gallbladder wall thickening, or biliary dilatation. Pancreas: Unremarkable. No pancreatic ductal dilatation or surrounding inflammatory changes. Spleen: Normal in size without focal abnormality. Adrenals/Urinary Tract: Left adrenal gland nodule with fat attenuation consistent with benign adenoma. No change since prior study. Kidneys are symmetrical in size. No hydronephrosis or hydroureter. No renal or ureteral stones. Bladder is normal for degree of distention. Stomach/Bowel: Stomach is within normal limits. Appendix appears normal. No evidence of bowel wall thickening, distention,  or inflammatory changes. Vascular/Lymphatic: Aortic atherosclerosis. No enlarged abdominal or pelvic lymph nodes. Reproductive: Prostate gland is mildly enlarged. Penile prosthesis with retention reservoir or in the anterior pelvis. Other: No free air or free fluid in the abdomen. Lipoma in the subcutaneous fat superficial to the left flank musculature. Vascular calcifications in the groin regions. Surgical clips in the right groin. Musculoskeletal: No acute or significant osseous findings. IMPRESSION: 1. Cardiac enlargement with mild interstitial edema and small bilateral pleural effusions. 2. Patchy  focal airspace disease in the right upper lung may represent multifocal pneumonia or early alveolar edema. 3. Mild subpleural fibrosis in the lungs. 4. Prominent mediastinal lymph nodes without pathologic enlargement, likely reactive. 5. Coronary artery and minimal aortic calcification. Aortic Atherosclerosis (ICD10-I70.0). Electronically Signed   By: Lucienne Capers M.D.   On: 01/19/2021 01:51    Review of Systems  Constitutional: Positive for activity change, appetite change, fatigue and unexpected weight change.  HENT: Negative.   Eyes: Negative.   Respiratory: Positive for shortness of breath.   Skin: Negative.   Neurological: Positive for dizziness, weakness and light-headedness.  Psychiatric/Behavioral: Positive for confusion.   Blood pressure 118/74, pulse 99, temperature 97.9 F (36.6 C), temperature source Oral, resp. rate 19, SpO2 100 %. Physical Exam Constitutional:      Appearance: He is ill-appearing.     Comments: cachectic  HENT:     Head: Normocephalic.     Mouth/Throat:     Mouth: Mucous membranes are moist.  Eyes:     Extraocular Movements: Extraocular movements intact.     Pupils: Pupils are equal, round, and reactive to light.  Musculoskeletal:        General: Normal range of motion.     Cervical back: Normal range of motion.  Skin:    General: Skin is warm and dry.  Neurological:     Mental Status: He is alert and oriented to person, place, and time.     Cranial Nerves: Cranial nerves are intact.     Sensory: Sensation is intact.     Motor: Motor function is intact. No pronator drift.     Coordination: Coordination is intact.     Comments: Gait not assessed Alert and oriented x 4, speech is clear and fluent Moving all extremities Following all commands      Assessment/Plan: [name] has a multitude of current medical problems, including chronic subdural hematomas bilaterally, MI, CHF, CAD, Afib, CVA, admitted for SOB. The subdural hematomas were  incidental findings, and pose no significant risk to his well being. He does not need urgent evacuation, nor is he a candidate given the plavix. I believe that conservative management for these collections is easily the best course of action. The risks of stopping the plavix is much higher than the subdural hematomas.  Ashok Pall 01/19/2021, 2:55 PM

## 2021-01-19 NOTE — ED Provider Notes (Signed)
  Physical Exam  BP 117/74   Pulse (!) 58   Temp 97.9 F (36.6 C) (Oral)   Resp (!) 24   SpO2 93%   Physical Exam  ED Course/Procedures   Clinical Course as of 01/19/21 0629  Wed Jan 19, 2021  0157 CT Chest Wo Contrast IMPRESSION: 1. Cardiac enlargement with mild interstitial edema and small bilateral pleural effusions. 2. Patchy focal airspace disease in the right upper lung may represent multifocal pneumonia or early alveolar edema. 3. Mild subpleural fibrosis in the lungs. 4. Prominent mediastinal lymph nodes without pathologic enlargement, likely reactive. 5. Coronary artery and minimal aortic calcification.  [CG]  [id number] Troponin I (High Sensitivity)(!!): 1,187 [CG]  0330 Creatinine(!): 2.57 [CG]  0330 BUN(!): 49 [CG]  0330 GFR, Estimated(!): 25 [CG]    Clinical Course User Index [CG] Kinnie Feil, PA-C    Procedures  MDM  Patient care assumed from [place]. PA-C please see note for a full HPI.Briefly, patient here with two episodes of shortness of breath long stand history of DM, CKD, CVA,  CHF, last echo with an EF of 30 to 40%.  Labs reviewed by me, kidney function within his baseline.  Patient had a first troponin 4 hours ago with a level of 1187--> 1,169 trending down.  Plan is for cardiology consult along with admission.  6:50 AM prior EDP he has spoken to cardiology [facility] who recommended admission via hospitalist service due to patient's lungs and history of renal insufficiency, diabetes.  Call placed to hospitalist service.  7:29 AM Spoke to Dr. Lorin Mercy who will admit patient for further management. Patient remains stable for admission.   Portions of this note were generated with [employer]. Dictation errors may occur despite best attempts at proofreading.       Janeece Fitting, PA-C 01/19/21 0730    Maudie Flakes, MD 01/20/21 [phone]

## 2021-01-19 NOTE — H&P (Signed)
History and Physical    [name] IWP:[id number] DOB: April 18, 1941 DOA: 01/18/2021  PCP: Prince Solian, MD Consultants:  Bensimhon - cardiology; Justin Mend - nephrology Patient coming from:  Home - lives with wife, [name]; [place]: Wife, Rolondo Pierre, [phone]  Chief Complaint:  SOB  HPI: [name] is a 80 y.o. male with medical history significant of CVA; HTN; HLD: DM; CKD; CAD (not a candidate for CABG) chronic systolic CHF; and afib presenting with  SOB.  He can't remember things, staggering, losing weight unintentionally, no appetite.  +SOB, does not wear home O2.  He had 2 episodes of CP this week - sharp pain with associated SOB, unable to breathe in or out.  Pain was on his left chest.  No dysphagia.  No abdominal pain.  He had diarrhea yesterday but does not usually have this.  No fevers.  Occasional urge incontinence.      ED Course:  EF 30-35%, here with CP and no SOB.  Troponin 1187 -> 1169.  Cardiology recommends [place] admission.   Review of Systems: As per HPI; otherwise review of systems reviewed and negative.   Ambulatory Status:  Ambulates without assistance, occasionally needs a cane  COVID Vaccine Status:  Uncertain  Past Medical History:  Diagnosis Date  . Allergy   . Atrial fibrillation ([place])   . Cataract   . CHF (congestive heart failure) ([place])   . Chronic kidney disease    Renal Insuffiecny  . Colon polyps 2012   Colonoscopy  . Diabetes mellitus    type 2  . Diverticulosis 2012   Colonoscopy   . Hyperglycemia 04/2019  . Hyperlipidemia   . Hypertension   . LBBB (left bundle branch block)   . Pneumonia 2015   3 times  . Stroke [facility]) 2015    Past Surgical History:  Procedure Laterality Date  . AMPUTATION Right 04/28/2016   Procedure: Right Transmetatarsal Amputation;  Surgeon: Newt Minion, MD;  Location: [place];  Service: Orthopedics;  Laterality: Right;  . CATARACT EXTRACTION Bilateral    both eyes  . COLON SURGERY     to repair intestines as  child  . COLONOSCOPY  10/20/2011   Procedure: COLONOSCOPY;  Surgeon: Inda Castle, MD;  Location: [facility];  Service: Endoscopy;  Laterality: N/A;  . FEMORAL-POPLITEAL BYPASS GRAFT Right 04/07/2016   Procedure: BYPASS GRAFT FEMORAL-POPLITEAL ARTERY-RIGHT;  Surgeon: Angelia Mould, MD;  Location: [place];  Service: Vascular;  Laterality: Right;  . HOT HEMOSTASIS  10/20/2011   Procedure: HOT HEMOSTASIS (ARGON PLASMA COAGULATION/BICAP);  Surgeon: Inda Castle, MD;  Location: [facility];  Service: Endoscopy;  Laterality: N/A;  . KNEE ARTHROSCOPY Left    left  . NECK SURGERY     disectomy  . PERIPHERAL VASCULAR CATHETERIZATION Right 04/05/2016   Procedure: Lower Extremity Angiography;  Surgeon: Rosetta Posner, MD;  Location: [place] [facility];  Service: Cardiovascular;  Laterality: Right;  . PERIPHERAL VASCULAR CATHETERIZATION N/A 04/05/2016   Procedure: Abdominal Aortogram;  Surgeon: Rosetta Posner, MD;  Location: [place] [facility];  Service: Cardiovascular;  Laterality: N/A;  . RIGHT/LEFT HEART CATH AND CORONARY ANGIOGRAPHY N/A 02/07/2019   Procedure: RIGHT/LEFT HEART CATH AND CORONARY ANGIOGRAPHY;  Surgeon: Jolaine Artist, MD;  Location: [place] [facility];  Service: Cardiovascular;  Laterality: N/A;  . VEIN HARVEST Right 04/07/2016   Procedure: VEIN HARVEST GREATER SAPHENOUS VIEN ;  Surgeon: Angelia Mould, MD;  Location: [place];  Service: Vascular;  Laterality: Right;  Social History   Socioeconomic History  . Marital status: Married    Spouse name: Not on file  . Number of children: 3  . Years of education: Not on file  . Highest education level: Not on file  Occupational History  . Occupation: Retired  Tobacco Use  . Smoking status: Current Every Day Smoker    Packs/day: 0.75    Years: 50.00    Pack years: 37.50    Types: Cigarettes  . Smokeless tobacco: Never Used  . Tobacco comment: 3 cigs/day currently  Vaping Use  . Vaping Use: Never used  Substance  and Sexual Activity  . Alcohol use: Yes    Alcohol/week: 0.0 standard drinks    Comment: rarely  . Drug use: No  . Sexual activity: Not on file  Other Topics Concern  . Not on file  Social History Narrative  . Not on file   Social Determinants of Health   Financial Resource Strain: Not on file  Food Insecurity: No Food Insecurity  . Worried About Charity fundraiser in the Last Year: Never true  . Ran Out of Food in the Last Year: Never true  Transportation Needs: No Transportation Needs  . Lack of Transportation (Medical): No  . Lack of Transportation (Non-Medical): No  Physical Activity: Not on file  Stress: Not on file  Social Connections: Not on file  Intimate Partner Violence: Not on file    No Known Allergies  Family History  Problem Relation Age of Onset  . Diabetes Mellitus II Mother   . Heart disease Brother        before age 20  . Colon cancer Neg Hx   . Esophageal cancer Neg Hx   . Stomach cancer Neg Hx   . Anesthesia problems Neg Hx   . Hypotension Neg Hx   . Malignant hyperthermia Neg Hx   . Pseudochol deficiency Neg Hx     Prior to Admission medications   Medication Sig Start Date End Date Taking? Authorizing Provider  aspirin EC 81 MG EC tablet Take 1 tablet (81 mg total) by mouth daily. 02/11/19   Mariel Aloe, MD  clopidogrel (PLAVIX) 75 MG tablet Take 75 mg by mouth daily.    [provider]  insulin aspart protamine- aspart (NOVOLOG MIX 70/30) (70-30) 100 UNIT/ML injection Inject into the skin. Sliding scale    [provider]  Insulin Pen Needle 32G X 4 MM MISC Use as directed daily at bedtime. 05/23/19   Hongalgi, Lenis Dickinson, MD  iron polysaccharides (FERREX 150) 150 MG capsule Take 1 capsule (150 mg total) by mouth daily. 05/23/19   Hongalgi, Lenis Dickinson, MD  midodrine (PROAMATINE) 5 MG tablet Take 1 tablet (5 mg total) by mouth 3 (three) times daily with meals. 12/08/20   Bensimhon, Shaune Pascal, MD  Multiple Vitamin (MULTIVITAMIN WITH  MINERALS) TABS tablet Take 1 tablet by mouth daily. 05/23/19   Hongalgi, Lenis Dickinson, MD  simvastatin (ZOCOR) 40 MG tablet Take 40 mg by mouth daily.    [provider]  torsemide (DEMADEX) 20 MG tablet Take 1 tablet (20 mg total) by mouth daily. 12/29/20   Bensimhon, Shaune Pascal, MD    Physical Exam: Vitals:   01/19/21 0630 01/19/21 0730 01/19/21 0739 01/19/21 0900  BP: 125/80  122/86 124/85  Pulse: 96 93 94 93  Resp: 18 (!) 29 19 10   Temp:      TempSrc:      SpO2: 99% 95% 95%  94%     . General:  Appears frail, chronically ill . Eyes:   EOMI, normal lids, iris . ENT:  grossly normal hearing, lips & tongue, mmm; edentulous . Neck:  no LAD, masses or thyromegaly . Cardiovascular:  RRR, no m/r/g. No LE edema.  Marland Kitchen Respiratory:   Scattered wheezes, good air movement, mildly increased respiratory effort. . Abdomen:  soft, NT, ND . Skin:  no rash or induration seen on limited exam . Musculoskeletal:  grossly normal tone BUE/BLE, good ROM, no bony abnormality; s/p R TMA that is remote and well healed . Lower extremity:  No LE edema.  Limited foot exam with no ulcerations.  2+ distal pulses. Marland Kitchen Psychiatric:  grossly normal mood and affect, speech fluent and appropriate, AOx3 . Neurologic:  CN 2-12 grossly intact, moves all extremities in coordinated fashion    Radiological Exams on Admission: Independently reviewed - see discussion in A/P where applicable  CT ABDOMEN PELVIS WO CONTRAST  Result Date: 01/19/2021 CLINICAL DATA:  Shortness of breath. Unintentional weight loss. Tobacco use. EXAM: CT CHEST, ABDOMEN AND PELVIS WITHOUT CONTRAST TECHNIQUE: Multidetector CT imaging of the chest, abdomen and pelvis was performed following the standard protocol without IV contrast. COMPARISON:  CT chest 04/01/2019 FINDINGS: CT CHEST FINDINGS Cardiovascular: Cardiac enlargement. No pericardial effusions. Prominent coronary artery and minimal aortic calcification. Normal caliber thoracic aorta.  Mediastinum/Nodes: Esophagus is decompressed. Moderately prominent mediastinal lymph nodes without pathologic enlargement, likely reactive. Similar appearance to previous study. Lungs/Pleura: Small bilateral pleural effusions. Basilar atelectasis. Mild subpleural fibrosis in the lungs. Mild interstitial pattern likely due to edema. Patchy focal airspace disease in the right upper lung may represent multifocal pneumonia or early edema. No pneumothorax. Musculoskeletal: Degenerative changes in the thoracic spine. Normal alignment. No vertebral compression deformities. Sternum and ribs are nondepressed. CT ABDOMEN PELVIS FINDINGS Hepatobiliary: No focal liver abnormality is seen. No gallstones, gallbladder wall thickening, or biliary dilatation. Pancreas: Unremarkable. No pancreatic ductal dilatation or surrounding inflammatory changes. Spleen: Normal in size without focal abnormality. Adrenals/Urinary Tract: Left adrenal gland nodule with fat attenuation consistent with benign adenoma. No change since prior study. Kidneys are symmetrical in size. No hydronephrosis or hydroureter. No renal or ureteral stones. Bladder is normal for degree of distention. Stomach/Bowel: Stomach is within normal limits. Appendix appears normal. No evidence of bowel wall thickening, distention, or inflammatory changes. Vascular/Lymphatic: Aortic atherosclerosis. No enlarged abdominal or pelvic lymph nodes. Reproductive: Prostate gland is mildly enlarged. Penile prosthesis with retention reservoir or in the anterior pelvis. Other: No free air or free fluid in the abdomen. Lipoma in the subcutaneous fat superficial to the left flank musculature. Vascular calcifications in the groin regions. Surgical clips in the right groin. Musculoskeletal: No acute or significant osseous findings. IMPRESSION: 1. Cardiac enlargement with mild interstitial edema and small bilateral pleural effusions. 2. Patchy focal airspace disease in the right upper lung  may represent multifocal pneumonia or early alveolar edema. 3. Mild subpleural fibrosis in the lungs. 4. Prominent mediastinal lymph nodes without pathologic enlargement, likely reactive. 5. Coronary artery and minimal aortic calcification. Aortic Atherosclerosis (ICD10-I70.0). Electronically Signed   By: Lucienne Capers M.D.   On: 01/19/2021 01:51   DG Chest 2 View  Result Date: 01/18/2021 CLINICAL DATA:  80 year old male with shortness of breath. EXAM: CHEST - 2 VIEW COMPARISON:  Chest radiograph dated 12/14/2020. FINDINGS: Faint left mid to lower lung field interstitial densities may represent atelectasis but concerning for developing infiltrate. There is a small left pleural effusion. No pneumothorax.  Mild cardiomegaly. Atherosclerotic calcification of the aorta. No acute osseous pathology. Degenerative changes of the spine. IMPRESSION: Small left pleural effusion with left mid to lower lung field atelectasis versus developing infiltrate. Electronically Signed   By: Anner Crete M.D.   On: 01/18/2021 23:15   CT Chest Wo Contrast  Result Date: 01/19/2021 CLINICAL DATA:  Shortness of breath. Unintentional weight loss. Tobacco use. EXAM: CT CHEST, ABDOMEN AND PELVIS WITHOUT CONTRAST TECHNIQUE: Multidetector CT imaging of the chest, abdomen and pelvis was performed following the standard protocol without IV contrast. COMPARISON:  CT chest 04/01/2019 FINDINGS: CT CHEST FINDINGS Cardiovascular: Cardiac enlargement. No pericardial effusions. Prominent coronary artery and minimal aortic calcification. Normal caliber thoracic aorta. Mediastinum/Nodes: Esophagus is decompressed. Moderately prominent mediastinal lymph nodes without pathologic enlargement, likely reactive. Similar appearance to previous study. Lungs/Pleura: Small bilateral pleural effusions. Basilar atelectasis. Mild subpleural fibrosis in the lungs. Mild interstitial pattern likely due to edema. Patchy focal airspace disease in the right upper  lung may represent multifocal pneumonia or early edema. No pneumothorax. Musculoskeletal: Degenerative changes in the thoracic spine. Normal alignment. No vertebral compression deformities. Sternum and ribs are nondepressed. CT ABDOMEN PELVIS FINDINGS Hepatobiliary: No focal liver abnormality is seen. No gallstones, gallbladder wall thickening, or biliary dilatation. Pancreas: Unremarkable. No pancreatic ductal dilatation or surrounding inflammatory changes. Spleen: Normal in size without focal abnormality. Adrenals/Urinary Tract: Left adrenal gland nodule with fat attenuation consistent with benign adenoma. No change since prior study. Kidneys are symmetrical in size. No hydronephrosis or hydroureter. No renal or ureteral stones. Bladder is normal for degree of distention. Stomach/Bowel: Stomach is within normal limits. Appendix appears normal. No evidence of bowel wall thickening, distention, or inflammatory changes. Vascular/Lymphatic: Aortic atherosclerosis. No enlarged abdominal or pelvic lymph nodes. Reproductive: Prostate gland is mildly enlarged. Penile prosthesis with retention reservoir or in the anterior pelvis. Other: No free air or free fluid in the abdomen. Lipoma in the subcutaneous fat superficial to the left flank musculature. Vascular calcifications in the groin regions. Surgical clips in the right groin. Musculoskeletal: No acute or significant osseous findings. IMPRESSION: 1. Cardiac enlargement with mild interstitial edema and small bilateral pleural effusions. 2. Patchy focal airspace disease in the right upper lung may represent multifocal pneumonia or early alveolar edema. 3. Mild subpleural fibrosis in the lungs. 4. Prominent mediastinal lymph nodes without pathologic enlargement, likely reactive. 5. Coronary artery and minimal aortic calcification. Aortic Atherosclerosis (ICD10-I70.0). Electronically Signed   By: Lucienne Capers M.D.   On: 01/19/2021 01:51    EKG: Independently  reviewed.  NSR with rate 95; LVH; nonspecific ST changes with global ischemia, NSCSLT   Labs on Admission: I have personally reviewed the available labs and imaging studies at the time of the admission.  Pertinent labs:   Glucose 220 BUN 49/Creatinine 2.57/GFR 25 - stable Troponin 1187, 1169, 930 Normal CBC UA: 100 protein   Assessment/Plan Principal Problem:   NSTEMI (non-ST elevated myocardial infarction) ([place]) Active Problems:   Hyperlipidemia   DM (diabetes mellitus), type 2 with renal complications (HCC)   Stage 4 chronic kidney disease (HCC)   Biventricular heart failure (HCC)   Failure to thrive in adult   NSTEMI -Patient with known h/o CAD with high-grade disease but not a candidate for CABG, medical management recommended -He had 2 recent episodes of severe CP with SOB and reportedly beat on his chest with his fist with improvement -He has downtrending troponin and with this history it is concerning for NSTEMI -CXR unremarkable; chest CT with  mild interstitial edema and small pleural effusions  -EKG with evidence of global ischemia but NSCSLT -Will admit since the patient has positive troponins and/or an abnormal EKG with angina possibly necessitating acute intervention. -Cardiology consultation requested -NTG for symptom relief (although there is no mortality benefit) -No heparin for now given downtrending troponin -Continue home ASA, Plavix  Acute on chronic combined CHF -Patient with known systolic CHF; last echo was in 03/2020 and showed EF 35-40% with global hypokinesis and grade 1 diastolic dysfunction -In addition to the CP noted above, he has had some SOB without hypoxia -He was due for repeat Echo in June; will order now instead -Will hold home Demadex and give Lasix 40 mg IV BID for now -Check BNP  Failure to thrive -In addition to CP and SOB, the patient primarily is concerned about confusion, weakness, and unintentional weight loss -Patient was  previously hospitalized with the same from 3/27-28 -While there may be an organic and reversible process in play, he appears to have multiple severe chronic medical conditions and possible cognitive decline associated with early dementia -With confusion, ataxia, and incontinence, will order head CT to assess for NPH -Will order PT/OT/nutrition evaluations -Will request palliative care evaluation for goals of care  Hypotension -Continue midodrine  HLD -Check lipids -Change Zocor 40 to Lipitor 40 mg PO daily  DM -Last A1c was 15 in 8/202 -That said, he appears to have only been taking 70/30 on an SSI basis -Will cover with sensitive-scale SSI for now -Diabetes coordinator consult requested  Afib -Not on rate-controlling medications -No on AC  Advanced CKD -Stable stage 4 CKD -Given other severe chronic medical problems, he may not be a candidate for HD -Followed outpatient by [name]  H/o CVA -Possible developing vascular dementia based on patient report -No current concern for active CVA  Tobacco dependence -Reports that he is down to 3 cigs/day -Ongoing efforts at cessation encouraged -Declines patch    DVT prophylaxis: Lovenox Code Status:  Full - confirmed with patient Family Communication: None present Disposition Plan:  The patient is from: home  Anticipated d/c is to: home, possibly with [facility] services  Anticipated d/c date will depend on clinical response to treatment, likely 2-3 days  Patient is currently: acutely ill Consults called: Cardiology; Palliative care; PT/OT/Nutrition/Diabetes coordinator Admission status: Admit - It is my clinical opinion that admission to [place] is reasonable and necessary because of the expectation that this patient will require hospital care that crosses at least 2 midnights to treat this condition based on the medical complexity of the problems presented.  Given the aforementioned information, the predictability of an adverse  outcome is felt to be significant.    Karmen Bongo MD [facility]   How to contact the [facility] Attending or Consulting provider [place] or covering provider during after hours [place], for this patient?  1. Check the care team in [facility] and look for a) attending/consulting [facility] provider listed and b) the [facility] team listed 2. Log into [email or web address] and use [place]'s universal password to access. If you do not have the password, please contact the hospital operator. 3. Locate the [facility] provider you are looking for under [facility] and page to a number that you can be directly reached. 4. If you still have difficulty reaching the provider, please page the [facility] (Director on Call) for the Hospitalists listed on amion for assistance.   01/19/2021, 10:09 AM

## 2021-01-19 NOTE — ED Provider Notes (Signed)
[facility] EMERGENCY DEPARTMENT Provider Note   CSN: [id number] Arrival date & time: 01/18/21  2201     History Chief Complaint  Patient presents with  . Shortness of Breath    Peter Hinderliter is a 80 y.o. male with history of chronic systolic HF, NICM with EF 35-40%, CAD, PAD, right transmetatarsal amputation, DM, HTN, HLD, CVA, renal insufficiency, tobacco use presents to the ER for two episodes of difficulty breathing over the last 1 week. Today at around 9 am he was laying down when he suddenly felt like he couldn't breath. Describes it as couldn't move air in or out. He got scared. He banged the center of his chest a couple of times and symptoms completely resolved. Symptoms lasted a few seconds. Breathing has felt at his baseline since.  He had no associated CP, light headedness, palpitations, syncope.  This happened a second time this past week.  Wife states patient suddenly told her he couldn't breathe. His "eyes bulged out of his head", then banged his chest and symptoms resolved.  She reports months of generalized weakness, no appetite, minimal food intake and weight loss. Has lost 20 pounds since January.  He has seen his nephrologist Dr Justin Mend who told him everything from a kidney stand point was ok.  He then saw Dr Haroldine Laws who wanted to do CT scans to see why he was losing so much weight.  They were told his heart seemed to be doing ok. Is on torsemide 20 mg every other day. Continues to smoke 3 cigarettes daily.  Chronic periodical wet cough but no worse. No hemoptysis. No new CP or SOB. Reports occasional but long standing SOB and fatigue with exertion but not any worse.  No nausea, vomiting, abdominal pain, diarrhea. No dysuria. No leg swelling. Does not feel like he is retaining fluid. No night sweats. No fevers, chills. No history of cancer. Today he had cereal and milk in the morning and a fruit smoothie. Patient states he just doesn't have an appetite.  Denies recent  depressive mood.   HPI     Past Medical History:  Diagnosis Date  . Allergy   . Atrial fibrillation ([place])   . Cataract   . CHF (congestive heart failure) ([place])   . Chronic kidney disease    Renal Insuffiecny  . Colon polyps 2012   Colonoscopy  . Diabetes mellitus    type 2  . Diverticulosis 2012   Colonoscopy   . Hyperglycemia 04/2019  . Hyperlipidemia   . Hypertension   . LBBB (left bundle branch block)   . Pneumonia 2015   3 times  . Stroke [facility]) 2015    Patient Active Problem List   Diagnosis Date Noted  . Biventricular heart failure ([place]) 09/24/2019  . Protein-calorie malnutrition, severe 05/23/2019  . Hyperosmolar non-ketotic state in patient with type 2 diabetes mellitus ([place]) 05/22/2019  . Uncontrolled diabetes mellitus with hyperglycemia ([place]) 05/21/2019  . Hyperkalemia 05/21/2019  . Acute renal failure superimposed on stage 3 chronic kidney disease ([place]) 05/21/2019  . Prolonged QT interval 05/21/2019  . Acute respiratory failure with hypoxia ([place]) 04/01/2019  . 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 and stage 3 chronic kidney disease ([place]) 04/01/2019  . Goals of care, counseling/discussion 04/01/2019  . Shingles outbreak 04/01/2019  . Community acquired pneumonia of left lower lobe of lung 02/05/2019  . CKD (chronic kidney disease) stage 3, GFR 30-59 ml/min (HCC)  02/05/2019  . Acute CHF (congestive heart failure) ([place]) 02/05/2019  . Acute on chronic congestive heart failure ([place]) 02/04/2019  . Aftercare following surgery of the circulatory system 08/09/2016  . Status post transmetatarsal amputation of foot, right ([place]) 08/03/2016  . 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 Cellulitis and abscess of foot 03/23/2016  . Diabetic infection of right foot ([place]) 03/23/2016  . Puncture wound of foot, right, complicated 45/40/9811  .  Malnutrition of moderate degree 03/23/2016  . AKI (acute kidney injury) ([place])   . Tobacco use disorder 02/28/2016  . Acute CVA (cerebrovascular accident) ([place]) 02/27/2016  . DM (diabetes mellitus), type 2 with renal complications ([place]) 91/47/8295  . Anemia 02/27/2016  . Chronic systolic CHF (congestive heart failure) ([place]) 05/05/2015  . 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 Essential hypertension 07/16/2014  . Former smoker 04/21/2014  . Thrombotic stroke ([place]) 01/23/2014  . Dizziness 01/23/2014  . Diabetes ([place]) 01/23/2014  . Facial droop 01/23/2014  . Benign neoplasm of colon 08/25/2011  . CAD, NATIVE VESSEL 05/23/2010  . DIABETIC  RETINOPATHY 03/21/2010  . Hyperlipidemia 03/21/2010  . NEUROPATHY 03/21/2010  . Primary hypertension 03/21/2010  . LBBB (left bundle branch block) 03/21/2010  . 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 CERVICAL RADICULOPATHY 03/21/2010    Past Surgical History:  Procedure Laterality Date  . AMPUTATION Right 04/28/2016   Procedure: Right Transmetatarsal Amputation;  Surgeon: Newt Minion, MD;  Location: [place];  Service: Orthopedics;  Laterality: Right;  . CATARACT EXTRACTION Bilateral    both eyes  . COLON SURGERY     to repair intestines as child  . COLONOSCOPY  10/20/2011   Procedure: COLONOSCOPY;  Surgeon: Inda Castle, MD;  Location: [facility];  Service: Endoscopy;  Laterality: N/A;  . FEMORAL-POPLITEAL BYPASS GRAFT Right 04/07/2016   Procedure: BYPASS GRAFT FEMORAL-POPLITEAL ARTERY-RIGHT;  Surgeon: Angelia Mould, MD;  Location: [place];  Service: Vascular;  Laterality: Right;  . HOT HEMOSTASIS  10/20/2011   Procedure: HOT HEMOSTASIS (ARGON PLASMA COAGULATION/BICAP);  Surgeon: Inda Castle, MD;  Location: [facility];  Service: Endoscopy;  Laterality: N/A;  . KNEE ARTHROSCOPY Left    left  . NECK SURGERY     disectomy  . PERIPHERAL VASCULAR CATHETERIZATION Right 04/05/2016   Procedure: Lower  Extremity Angiography;  Surgeon: Rosetta Posner, MD;  Location: [place] CV LAB;  Service: Cardiovascular;  Laterality: Right;  . PERIPHERAL VASCULAR CATHETERIZATION N/A 04/05/2016   Procedure: Abdominal Aortogram;  Surgeon: Rosetta Posner, MD;  Location: [name] [facility];  Service: Cardiovascular;  Laterality: N/A;  . RIGHT/LEFT HEART CATH AND CORONARY ANGIOGRAPHY N/A 02/07/2019   Procedure: RIGHT/LEFT HEART CATH AND CORONARY ANGIOGRAPHY;  Surgeon: Jolaine Artist, MD;  Location: [place] [facility] LAB;  Service: Cardiovascular;  Laterality: N/A;  . VEIN HARVEST Right 04/07/2016   Procedure: VEIN HARVEST GREATER SAPHENOUS VIEN ;  Surgeon: Angelia Mould, MD;  Location: [facility] OR;  Service: Vascular;  Laterality: Right;       Family History  Problem Relation Age of Onset  . Diabetes Mellitus II Mother   . Heart disease Brother        before age 105  . Colon cancer Neg Hx   . Esophageal cancer Neg Hx   . Stomach cancer Neg Hx   . Anesthesia problems Neg Hx   . Hypotension Neg Hx   . Malignant hyperthermia Neg Hx   .  Pseudochol deficiency Neg Hx     Social History   Tobacco Use  . Smoking status: Current Every Day Smoker    Packs/day: 0.25    Years: 20.00    Pack years: 5.00    Types: Cigarettes  . Smokeless tobacco: Never Used  . Tobacco comment: Quit June 2017, patient states stil smoking some 1 or 2 cigarettes per day.  Vaping Use  . Vaping Use: Never used  Substance Use Topics  . Alcohol use: Yes    Alcohol/week: 0.0 standard drinks    Comment: rarely  . Drug use: No    Home Medications Prior to Admission medications   Medication Sig Start Date End Date Taking? Authorizing Provider  aspirin EC 81 MG EC tablet Take 1 tablet (81 mg total) by mouth daily. 02/11/19   Mariel Aloe, MD  clopidogrel (PLAVIX) 75 MG tablet Take 75 mg by mouth daily.    [provider]  insulin aspart protamine- aspart (NOVOLOG MIX 70/30) (70-30) 100 UNIT/ML injection Inject into  the skin. Sliding scale    [provider]  Insulin Pen Needle 32G X 4 MM MISC Use as directed daily at bedtime. 05/23/19   Hongalgi, Lenis Dickinson, MD  iron polysaccharides (FERREX 150) 150 MG capsule Take 1 capsule (150 mg total) by mouth daily. 05/23/19   Hongalgi, Lenis Dickinson, MD  midodrine (PROAMATINE) 5 MG tablet Take 1 tablet (5 mg total) by mouth 3 (three) times daily with meals. 12/08/20   Bensimhon, Shaune Pascal, MD  Multiple Vitamin (MULTIVITAMIN WITH MINERALS) TABS tablet Take 1 tablet by mouth daily. 05/23/19   Hongalgi, Lenis Dickinson, MD  simvastatin (ZOCOR) 40 MG tablet Take 40 mg by mouth daily.    [provider]  torsemide (DEMADEX) 20 MG tablet Take 1 tablet (20 mg total) by mouth daily. 12/29/20   Bensimhon, Shaune Pascal, MD    Allergies    Patient has no known allergies.  Review of Systems   Review of Systems  Constitutional: Positive for appetite change and fatigue.  Respiratory: Positive for cough (chronic) and shortness of breath.   All other systems reviewed and are negative.   Physical Exam Updated Vital Signs BP 120/78   Pulse 99   Temp 97.9 F (36.6 C) (Oral)   Resp (!) 28   SpO2 95%   Physical Exam Vitals and nursing note reviewed.  Constitutional:      General: He is not in acute distress.    Appearance: He is well-developed.     Comments: Cachectic in appearing. No distress. Alert   HENT:     Head: Normocephalic and atraumatic.     Comments: Temporal wasting     Right Ear: External ear normal.     Left Ear: External ear normal.     Nose: Nose normal.  Eyes:     General: No scleral icterus.    Conjunctiva/sclera: Conjunctivae normal.  Cardiovascular:     Rate and Rhythm: Normal rate and regular rhythm.     Heart sounds: Normal heart sounds.     Comments: No LE edema. No calf tenderness.  Pulmonary:     Effort: Pulmonary effort is normal.     Breath sounds: Examination of the right-lower field reveals decreased breath sounds. Examination of the  left-lower field reveals decreased breath sounds. Decreased breath sounds present. No wheezing.     Comments: Speaking in full sentences. Diminished bibasilar. No wheezing, crackles.  Abdominal:     Palpations: Abdomen is soft.  Tenderness: There is no abdominal tenderness.  Musculoskeletal:        General: No deformity. Normal range of motion.     Cervical back: Normal range of motion and neck supple.     Comments: S/p right transmetatarsal amputation   Skin:    General: Skin is warm and dry.     Capillary Refill: Capillary refill takes less than 2 seconds.  Neurological:     Mental Status: He is alert and oriented to person, place, and time.  Psychiatric:        Behavior: Behavior normal.        Thought Content: Thought content normal.        Judgment: Judgment normal.     ED Results / Procedures / Treatments   Labs (all labs ordered are listed, but only abnormal results are displayed) Labs Reviewed  COMPREHENSIVE METABOLIC PANEL - Abnormal; Notable for the following components:      Result Value   Glucose, Bld 220 (*)    BUN 49 (*)    Creatinine, Ser 2.57 (*)    AST 49 (*)    GFR, Estimated 25 (*)    All other components within normal limits  URINALYSIS, ROUTINE W REFLEX MICROSCOPIC - Abnormal; Notable for the following components:   APPearance HAZY (*)    Protein, ur 100 (*)    All other components within normal limits  TROPONIN I (HIGH SENSITIVITY) - Abnormal; Notable for the following components:   Troponin I (High Sensitivity) 1,187 (*)    All other components within normal limits  TROPONIN I (HIGH SENSITIVITY) - Abnormal; Notable for the following components:   Troponin I (High Sensitivity) 1,169 (*)    All other components within normal limits  CBC WITH DIFFERENTIAL/PLATELET  TROPONIN I (HIGH SENSITIVITY)    EKG EKG Interpretation  Date/Time:  Wednesday January 19 2021 03:30:57 EDT Ventricular Rate:  95 PR Interval:  198 QRS Duration: 153 QT  Interval:  382 QTC Calculation: 481 R Axis:   104 Text Interpretation: Right and left arm electrode reversal, interpretation assumes no reversal Sinus rhythm Consider left ventricular hypertrophy Repol abnrm, global ischemia, diffuse leads No significant change was found Confirmed by Gerlene Fee [phone]) on 01/19/2021 3:34:10 AM   Radiology CT ABDOMEN PELVIS WO CONTRAST  Result Date: 01/19/2021 CLINICAL DATA:  Shortness of breath. Unintentional weight loss. Tobacco use. EXAM: CT CHEST, ABDOMEN AND PELVIS WITHOUT CONTRAST TECHNIQUE: Multidetector CT imaging of the chest, abdomen and pelvis was performed following the standard protocol without IV contrast. COMPARISON:  CT chest 04/01/2019 FINDINGS: CT CHEST FINDINGS Cardiovascular: Cardiac enlargement. No pericardial effusions. Prominent coronary artery and minimal aortic calcification. Normal caliber thoracic aorta. Mediastinum/Nodes: Esophagus is decompressed. Moderately prominent mediastinal lymph nodes without pathologic enlargement, likely reactive. Similar appearance to previous study. Lungs/Pleura: Small bilateral pleural effusions. Basilar atelectasis. Mild subpleural fibrosis in the lungs. Mild interstitial pattern likely due to edema. Patchy focal airspace disease in the right upper lung may represent multifocal pneumonia or early edema. No pneumothorax. Musculoskeletal: Degenerative changes in the thoracic spine. Normal alignment. No vertebral compression deformities. Sternum and ribs are nondepressed. CT ABDOMEN PELVIS FINDINGS Hepatobiliary: No focal liver abnormality is seen. No gallstones, gallbladder wall thickening, or biliary dilatation. Pancreas: Unremarkable. No pancreatic ductal dilatation or surrounding inflammatory changes. Spleen: Normal in size without focal abnormality. Adrenals/Urinary Tract: Left adrenal gland nodule with fat attenuation consistent with benign adenoma. No change since prior study. Kidneys are symmetrical in size.  No hydronephrosis or hydroureter. No  renal or ureteral stones. Bladder is normal for degree of distention. Stomach/Bowel: Stomach is within normal limits. Appendix appears normal. No evidence of bowel wall thickening, distention, or inflammatory changes. Vascular/Lymphatic: Aortic atherosclerosis. No enlarged abdominal or pelvic lymph nodes. Reproductive: Prostate gland is mildly enlarged. Penile prosthesis with retention reservoir or in the anterior pelvis. Other: No free air or free fluid in the abdomen. Lipoma in the subcutaneous fat superficial to the left flank musculature. Vascular calcifications in the groin regions. Surgical clips in the right groin. Musculoskeletal: No acute or significant osseous findings. IMPRESSION: 1. Cardiac enlargement with mild interstitial edema and small bilateral pleural effusions. 2. Patchy focal airspace disease in the right upper lung may represent multifocal pneumonia or early alveolar edema. 3. Mild subpleural fibrosis in the lungs. 4. Prominent mediastinal lymph nodes without pathologic enlargement, likely reactive. 5. Coronary artery and minimal aortic calcification. Aortic Atherosclerosis (ICD10-I70.0). Electronically Signed   By: Lucienne Capers M.D.   On: 01/19/2021 01:51   DG Chest 2 View  Result Date: 01/18/2021 CLINICAL DATA:  80 year old male with shortness of breath. EXAM: CHEST - 2 VIEW COMPARISON:  Chest radiograph dated 12/14/2020. FINDINGS: Faint left mid to lower lung field interstitial densities may represent atelectasis but concerning for developing infiltrate. There is a small left pleural effusion. No pneumothorax. Mild cardiomegaly. Atherosclerotic calcification of the aorta. No acute osseous pathology. Degenerative changes of the spine. IMPRESSION: Small left pleural effusion with left mid to lower lung field atelectasis versus developing infiltrate. Electronically Signed   By: Anner Crete M.D.   On: 01/18/2021 23:15   CT Chest Wo  Contrast  Result Date: 01/19/2021 CLINICAL DATA:  Shortness of breath. Unintentional weight loss. Tobacco use. EXAM: CT CHEST, ABDOMEN AND PELVIS WITHOUT CONTRAST TECHNIQUE: Multidetector CT imaging of the chest, abdomen and pelvis was performed following the standard protocol without IV contrast. COMPARISON:  CT chest 04/01/2019 FINDINGS: CT CHEST FINDINGS Cardiovascular: Cardiac enlargement. No pericardial effusions. Prominent coronary artery and minimal aortic calcification. Normal caliber thoracic aorta. Mediastinum/Nodes: Esophagus is decompressed. Moderately prominent mediastinal lymph nodes without pathologic enlargement, likely reactive. Similar appearance to previous study. Lungs/Pleura: Small bilateral pleural effusions. Basilar atelectasis. Mild subpleural fibrosis in the lungs. Mild interstitial pattern likely due to edema. Patchy focal airspace disease in the right upper lung may represent multifocal pneumonia or early edema. No pneumothorax. Musculoskeletal: Degenerative changes in the thoracic spine. Normal alignment. No vertebral compression deformities. Sternum and ribs are nondepressed. CT ABDOMEN PELVIS FINDINGS Hepatobiliary: No focal liver abnormality is seen. No gallstones, gallbladder wall thickening, or biliary dilatation. Pancreas: Unremarkable. No pancreatic ductal dilatation or surrounding inflammatory changes. Spleen: Normal in size without focal abnormality. Adrenals/Urinary Tract: Left adrenal gland nodule with fat attenuation consistent with benign adenoma. No change since prior study. Kidneys are symmetrical in size. No hydronephrosis or hydroureter. No renal or ureteral stones. Bladder is normal for degree of distention. Stomach/Bowel: Stomach is within normal limits. Appendix appears normal. No evidence of bowel wall thickening, distention, or inflammatory changes. Vascular/Lymphatic: Aortic atherosclerosis. No enlarged abdominal or pelvic lymph nodes. Reproductive: Prostate gland  is mildly enlarged. Penile prosthesis with retention reservoir or in the anterior pelvis. Other: No free air or free fluid in the abdomen. Lipoma in the subcutaneous fat superficial to the left flank musculature. Vascular calcifications in the groin regions. Surgical clips in the right groin. Musculoskeletal: No acute or significant osseous findings. IMPRESSION: 1. Cardiac enlargement with mild interstitial edema and small bilateral pleural effusions. 2. Patchy focal airspace disease  in the right upper lung may represent multifocal pneumonia or early alveolar edema. 3. Mild subpleural fibrosis in the lungs. 4. Prominent mediastinal lymph nodes without pathologic enlargement, likely reactive. 5. Coronary artery and minimal aortic calcification. Aortic Atherosclerosis (ICD10-I70.0). Electronically Signed   By: Lucienne Capers M.D.   On: 01/19/2021 01:51    Procedures Procedures   Medications Ordered in ED Medications - No data to display  ED Course  I have reviewed the triage vital signs and the nursing notes.  Pertinent labs & imaging results that were available during my care of the patient were reviewed by me and considered in my medical decision making (see chart for details).  Clinical Course as of 01/19/21 0612  Wed Jan 19, 2021  0157 CT Chest Wo Contrast IMPRESSION: 1. Cardiac enlargement with mild interstitial edema and small bilateral pleural effusions. 2. Patchy focal airspace disease in the right upper lung may represent multifocal pneumonia or early alveolar edema. 3. Mild subpleural fibrosis in the lungs. 4. Prominent mediastinal lymph nodes without pathologic enlargement, likely reactive. 5. Coronary artery and minimal aortic calcification.  [CG]  [id number] Troponin I (High Sensitivity)(!!): 1,187 [CG]  0330 Creatinine(!): 2.57 [CG]  0330 BUN(!): 49 [CG]  0330 GFR, Estimated(!): 25 [CG]    Clinical Course User Index [CG] Kinnie Feil, PA-C   MDM Rules/Calculators/A&P                           80 yo M with multiple medical problems presents for two episodes of sudden onset of SOB in the last 1 week.  Has had decreased appetite, 20# weight loss in the last few months.  No red flags like CP, palpitations, syncope. No recent fevers, changes in chronic cough, hemoptysis, edema. On Plavix.   EMR triage and nursing notes reviewed.  Seen by Dr Haroldine Laws and nephrologist for ongoing light headedness/dizziness, weakness recently. Thinking malignancy vs uremia. Had planned on CT CAP.   Labs, imaging in the ED personally reviewed and interpreted.   Labs reveal - Stable creatinine 2.57, BUN 49, GFR 25. Normal WBC, hemoglobin.   Imaging - CT C/A/P without contrast reveals CM, mild interstitial edema, small bilateral pleural effusions, ?patchy focal airspace RUL either pneumonia vs edema, prominent mediastinal lymph nodes.  EKG shows SR, LVH with repolarization QRS widening. EKGs reviewed with EDP Bero.   Patient discussed with EDP Bero who also evaluated patient, added troponin.   0335: Trop 2423, no others to compare. [place] 2020 shows high grade distal LM lesion - patient not felt to be a CABG candidate and recommending medical management. Patient has no CP now. Pending cardiology consult.   [id number]: Paged cardiology x 3.   Final Clinical Impression(s) / ED Diagnoses Final diagnoses:  Elevated troponin    Rx / DC Orders ED Discharge Orders    None       Kinnie Feil, PA-C 01/19/21 0612    Maudie Flakes, MD 01/19/21 [phone]

## 2021-01-19 NOTE — ED Notes (Signed)
Patient transported to CT 

## 2021-01-20 ENCOUNTER — Encounter (HOSPITAL_COMMUNITY): Payer: Self-pay | Admitting: Internal Medicine

## 2021-01-20 ENCOUNTER — Other Ambulatory Visit: Payer: Self-pay

## 2021-01-20 DIAGNOSIS — S065X9A Traumatic subdural hemorrhage with loss of consciousness of unspecified duration, initial encounter: Secondary | ICD-10-CM

## 2021-01-20 DIAGNOSIS — E1122 Type 2 diabetes mellitus with diabetic chronic kidney disease: Secondary | ICD-10-CM

## 2021-01-20 DIAGNOSIS — Z515 Encounter for palliative care: Secondary | ICD-10-CM

## 2021-01-20 DIAGNOSIS — N184 Chronic kidney disease, stage 4 (severe): Secondary | ICD-10-CM

## 2021-01-20 DIAGNOSIS — Z7189 Other specified counseling: Secondary | ICD-10-CM

## 2021-01-20 DIAGNOSIS — I5023 Acute on chronic systolic (congestive) heart failure: Secondary | ICD-10-CM

## 2021-01-20 DIAGNOSIS — I214 Non-ST elevation (NSTEMI) myocardial infarction: Secondary | ICD-10-CM | POA: Diagnosis not present

## 2021-01-20 LAB — CBC
HCT: 42.8 % (ref 39.0–52.0)
Hemoglobin: 13.5 g/dL (ref 13.0–17.0)
MCH: 29.9 pg (ref 26.0–34.0)
MCHC: 31.5 g/dL (ref 30.0–36.0)
MCV: 94.9 fL (ref 80.0–100.0)
Platelets: 191 10*3/uL (ref 150–400)
RBC: 4.51 MIL/uL (ref 4.22–5.81)
RDW: 14.6 % (ref 11.5–15.5)
WBC: 7.1 10*3/uL (ref 4.0–10.5)
nRBC: 0 % (ref 0.0–0.2)

## 2021-01-20 LAB — RAPID URINE DRUG SCREEN, HOSP PERFORMED
Amphetamines: NOT DETECTED
Barbiturates: NOT DETECTED
Benzodiazepines: NOT DETECTED
Cocaine: NOT DETECTED
Opiates: NOT DETECTED
Tetrahydrocannabinol: NOT DETECTED

## 2021-01-20 LAB — BASIC METABOLIC PANEL
Anion gap: 14 (ref 5–15)
BUN: 65 mg/dL — ABNORMAL HIGH (ref 8–23)
CO2: 21 mmol/L — ABNORMAL LOW (ref 22–32)
Calcium: 9 mg/dL (ref 8.9–10.3)
Chloride: 102 mmol/L (ref 98–111)
Creatinine, Ser: 3.06 mg/dL — ABNORMAL HIGH (ref 0.61–1.24)
GFR, Estimated: 20 mL/min — ABNORMAL LOW (ref >60)
Glucose, Bld: 79 mg/dL (ref 70–99)
Potassium: 5.3 mmol/L — ABNORMAL HIGH (ref 3.5–5.1)
Sodium: 137 mmol/L (ref 135–145)

## 2021-01-20 LAB — GLUCOSE, CAPILLARY
Glucose-Capillary: 93 mg/dL (ref 70–99)
Glucose-Capillary: 220 mg/dL — ABNORMAL HIGH (ref 70–99)
Glucose-Capillary: 166 mg/dL — ABNORMAL HIGH (ref 70–99)
Glucose-Capillary: 160 mg/dL — ABNORMAL HIGH (ref 70–99)

## 2021-01-20 LAB — RESP PANEL BY RT-PCR (FLU A&B, COVID) ARPGX2
Influenza A by PCR: NEGATIVE
Influenza B by PCR: NEGATIVE
SARS Coronavirus 2 by RT PCR: NEGATIVE

## 2021-01-20 LAB — PROTIME-INR
INR: 1.2 (ref 0.8–1.2)
Prothrombin Time: 15.3 seconds — ABNORMAL HIGH (ref 11.4–15.2)

## 2021-01-20 MED ORDER — ENSURE ENLIVE PO LIQD
237.0000 mL | Freq: Three times a day (TID) | ORAL | Status: DC
  Administered 2021-01-20 – 2021-01-22 (×4): 237 mL via ORAL

## 2021-01-20 MED ORDER — NICOTINE 14 MG/24HR TD PT24
14.0000 mg | MEDICATED_PATCH | Freq: Every day | TRANSDERMAL | Status: DC
  Administered 2021-01-20 – 2021-01-22 (×3): 14 mg via TRANSDERMAL
  Filled 2021-01-20 (×3): qty 1

## 2021-01-20 MED ORDER — SODIUM ZIRCONIUM CYCLOSILICATE 10 G PO PACK
10.0000 g | PACK | Freq: Once | ORAL | Status: AC
  Administered 2021-01-20: 10 g via ORAL
  Filled 2021-01-20: qty 1

## 2021-01-20 MED ORDER — ENSURE ENLIVE PO LIQD
237.0000 mL | Freq: Two times a day (BID) | ORAL | Status: DC
  Administered 2021-01-20: 237 mL via ORAL

## 2021-01-20 MED ORDER — ADULT MULTIVITAMIN W/MINERALS CH
1.0000 | ORAL_TABLET | Freq: Every day | ORAL | Status: DC
  Administered 2021-01-20 – 2021-01-22 (×3): 1 via ORAL
  Filled 2021-01-20 (×3): qty 1

## 2021-01-20 NOTE — Progress Notes (Addendum)
Advanced Heart Failure Rounding Note  PCP-Cardiologist: No primary care provider on file.   Subjective:    CT head with bilateral subacute subdural hematomas. Neurosurgery consulted. ASA & plavix stopped.    Complaining of leg weakness. Denies SOB.    Objective:   Weight Range: 64.7 kg Body mass index is 21.06 kg/m.   Vital Signs:   Temp:  [97.5 F (36.4 C)-98.7 F (37.1 C)] 98 F (36.7 C) (04/28 1118) Pulse Rate:  [63-99] 96 (04/28 1118) Resp:  [10-28] 16 (04/28 1118) BP: (108-159)/(62-124) 128/69 (04/28 1118) SpO2:  [94 %-100 %] 98 % (04/28 1118) Weight:  [64.7 kg] 64.7 kg (04/28 0054) Last BM Date: 01/19/21  Weight change: Filed Weights   01/20/21 0054  Weight: 64.7 kg    Intake/Output:   Intake/Output Summary (Last 24 hours) at 01/20/2021 1325 Last data filed at 01/20/2021 0943 Gross per 24 hour  Intake 123 ml  Output --  Net 123 ml      Physical Exam    General:   No resp difficulty HEENT: Normal Neck: Supple. JVP flat . Carotids 2+ bilat; no bruits. No lymphadenopathy or thyromegaly appreciated. Cor: PMI nondisplaced. Regular rate & rhythm. No rubs, gallops or murmurs. Lungs: Clear on room air Abdomen: Soft, nontender, nondistended. No hepatosplenomegaly. No bruits or masses. Good bowel sounds. Extremities: No cyanosis, clubbing, rash, edema Neuro: Alert & orientedx3, cranial nerves grossly intact. moves all 4 extremities w/o difficulty. Affect pleasant   Telemetry   NSR 80-90s   EKG    n/a  Labs    CBC Recent Labs    01/18/21 2241 01/20/21 0332  WBC 7.1 7.1  NEUTROABS 4.8  --   HGB 13.1 13.5  HCT 41.9 42.8  MCV 95.9 94.9  PLT 201 128   Basic Metabolic Panel Recent Labs    01/18/21 2241 01/20/21 0332  NA 140 137  K 4.9 5.3*  CL 102 102  CO2 26 21*  GLUCOSE 220* 79  BUN 49* 65*  CREATININE 2.57* 3.06*  CALCIUM 9.5 9.0   Liver Function Tests Recent Labs    01/18/21 2241  AST 49*  ALT 36  ALKPHOS 76  BILITOT  0.9  PROT 6.8  ALBUMIN 3.5   No results for input(s): LIPASE, AMYLASE in the last 72 hours. Cardiac Enzymes No results for input(s): CKTOTAL, CKMB, CKMBINDEX, TROPONINI in the last 72 hours.  BNP: BNP (last 3 results) Recent Labs    11/03/20 1036 12/15/20 0212 01/19/21 0954  BNP 1,049.6* 2,613.3* 3,838.2*    ProBNP (last 3 results) No results for input(s): PROBNP in the last 8760 hours.   D-Dimer No results for input(s): DDIMER in the last 72 hours. Hemoglobin A1C Recent Labs    01/19/21 0950  HGBA1C 7.9*   Fasting Lipid Panel Recent Labs    01/19/21 0951  CHOL 144  HDL 43  LDLCALC 81  TRIG 98  CHOLHDL 3.3   Thyroid Function Tests Recent Labs    01/19/21 0950  TSH 1.962    Other results:   Imaging    ECHOCARDIOGRAM COMPLETE  Result Date: 01/19/2021    ECHOCARDIOGRAM REPORT   Patient Name:   KOREY PRASHAD Date of Exam: 01/19/2021 Medical Rec #:  [id number]    Height:       69.0 in Accession #:    [id number]   Weight:       144.2 lb Date of Birth:  September 27, 1940   BSA:  1.798 m Patient Age:    80 years     BP:           124/85 mmHg Patient Gender: M            HR:           76 bpm. Exam Location:  Inpatient Procedure: 2D Echo, Cardiac Doppler and Color Doppler Indications:    I50.23 Acute on chronic systolic (congestive) heart failure  History:        Patient has prior history of Echocardiogram examinations, most                 recent 04/12/2020. CHF, Stroke, Arrythmias:Atrial Fibrillation                 and LBBB; Risk Factors:Hypertension, Diabetes and Dyslipidemia.  Sonographer:    Jonelle Sidle Dance Referring Phys: [id number] JENNIFER YATES  Sonographer Comments: Image acquisition challenging due to respiratory motion. IMPRESSIONS  1. Left ventricular ejection fraction, by estimation, is <20%. The left ventricle has severely decreased function. The left ventricle demonstrates global hypokinesis. The left ventricular internal cavity size was mildly dilated. There is  mild left ventricular hypertrophy. Left ventricular diastolic parameters are indeterminate.  2. Right ventricular systolic function is moderately reduced. The right ventricular size is normal. There is moderately elevated pulmonary artery systolic pressure. The estimated right ventricular systolic pressure is 94.8 mmHg.  3. Left atrial size was severely dilated.  4. The mitral valve is normal in structure. Mild to moderate mitral valve regurgitation. No evidence of mitral stenosis.  5. Tricuspid valve regurgitation is mild to moderate.  6. The aortic valve is tricuspid. Aortic valve regurgitation is not visualized. Mild to moderate aortic valve sclerosis/calcification is present, without any evidence of aortic stenosis.  7. The inferior vena cava is dilated in size with <50% respiratory variability, suggesting right atrial pressure of 15 mmHg. FINDINGS  Left Ventricle: Left ventricular ejection fraction, by estimation, is <20%. The left ventricle has severely decreased function. The left ventricle demonstrates global hypokinesis. The left ventricular internal cavity size was mildly dilated. There is mild left ventricular hypertrophy. Left ventricular diastolic parameters are indeterminate. Right Ventricle: The right ventricular size is normal. No increase in right ventricular wall thickness. Right ventricular systolic function is moderately reduced. There is moderately elevated pulmonary artery systolic pressure. The tricuspid regurgitant velocity is 3.23 m/s, and with an assumed right atrial pressure of 15 mmHg, the estimated right ventricular systolic pressure is 54.6 mmHg. Left Atrium: Left atrial size was severely dilated. Right Atrium: Right atrial size was normal in size. Pericardium: There is no evidence of pericardial effusion. Mitral Valve: The mitral valve is normal in structure. Mild to moderate mitral valve regurgitation. No evidence of mitral valve stenosis. Tricuspid Valve: The tricuspid valve is  normal in structure. Tricuspid valve regurgitation is mild to moderate. Aortic Valve: The aortic valve is tricuspid. Aortic valve regurgitation is not visualized. Mild to moderate aortic valve sclerosis/calcification is present, without any evidence of aortic stenosis. Pulmonic Valve: The pulmonic valve was not well visualized. Pulmonic valve regurgitation is not visualized. Aorta: The aortic root and ascending aorta are structurally normal, with no evidence of dilitation. Venous: The inferior vena cava is dilated in size with less than 50% respiratory variability, suggesting right atrial pressure of 15 mmHg. IAS/Shunts: The interatrial septum was not well visualized.  LEFT VENTRICLE PLAX 2D LVIDd:         5.60 cm  Diastology LVIDs:  4.80 cm  LV e' medial:    2.39 cm/s LV PW:         1.20 cm  LV E/e' medial:  36.6 LV IVS:        0.90 cm  LV e' lateral:   5.84 cm/s LVOT diam:     2.10 cm  LV E/e' lateral: 15.0 LV SV:         23 LV SV Index:   13 LVOT Area:     3.46 cm  RIGHT VENTRICLE            IVC RV Basal diam:  3.20 cm    IVC diam: 2.10 cm RV Mid diam:    2.30 cm RV S prime:     4.62 cm/s TAPSE (M-mode): 1.0 cm LEFT ATRIUM              Index       RIGHT ATRIUM           Index LA diam:        5.00 cm  2.78 cm/m  RA Area:     14.30 cm LA Vol (A2C):   147.0 ml 81.76 ml/m RA Volume:   41.10 ml  22.86 ml/m LA Vol (A4C):   70.9 ml  39.44 ml/m LA Biplane Vol: 110.0 ml 61.18 ml/m  AORTIC VALVE LVOT Vmax:   39.50 cm/s LVOT Vmean:  26.700 cm/s LVOT VTI:    0.067 m  AORTA Ao Root diam: 3.10 cm Ao Asc diam:  3.20 cm MITRAL VALVE                 TRICUSPID VALVE MV Area (PHT): 3.21 cm      TR Peak grad:   41.7 mmHg MV Decel Time: 236 msec      TR Vmax:        323.00 cm/s MR Peak grad:    78.9 mmHg MR Mean grad:    52.0 mmHg   SHUNTS MR Vmax:         444.00 cm/s Systemic VTI:  0.07 m MR Vmean:        339.0 cm/s  Systemic Diam: 2.10 cm MR PISA:         0.57 cm MR PISA Eff ROA: 5 mm MR PISA Radius:  0.30 cm MV E  velocity: 87.40 cm/s Oswaldo Milian MD Electronically signed by Oswaldo Milian MD Signature Date/Time: 01/19/2021/7:55:38 PM    Final       Medications:     Scheduled Medications: . atorvastatin  40 mg Oral Daily  . feeding supplement  237 mL Oral BID BM  . insulin aspart  0-5 Units Subcutaneous QHS  . insulin aspart  0-9 Units Subcutaneous TID WC  . midodrine  5 mg Oral TID WC  . sodium chloride flush  3 mL Intravenous Q12H  . torsemide  20 mg Oral Daily     Infusions: . sodium chloride       PRN Medications:  sodium chloride, acetaminophen, nitroGLYCERIN, ondansetron (ZOFRAN) IV, sodium chloride flush      Assessment/Plan  Elevated Troponin  History of CAD - Patient has history of CAD with known high-grade distal left main disease. Not felt to be CABG or PCI candidate due to other comorbidities.   - Patient presented with continued weakness and poor appetite as well as 2 brief episodes of shortness of breath and was found to have elevated high-sensitivity troponin. - High-sensitivity troponin peaked at 1,187 >8101>751 . - - -  No IV Heparin due to downtrending troponin and subdural hematoma. - Unclear exactly what caused marekd troponin elevation. Likely secondary to known severe CAD. Also question role of subdural hematoma in this. Regardless, not a candidate for any intervention given CKD and subdural hematura. Will hold DAPT until Neurosurgery can see. Continue statin.  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 RV normal in size with moderately reduced systolic function. Most recent Echo in 03/2020 showed some improvement in EF to 35-40%. RV systolic function normal at that time. - BNP 3838 . -  Repeat Echo < 20% RV moderately reduced.  - Volume status stable. Continue torsemide.  - No ACE/ARB/ARNI or MRA due to renal function.  - Previously on Coreg and Bidil but this had to be  stopped due to low BP. - No SGLT2 inhibitor due to renal function and weight loss.  Questionable History of Atrial Fibrillation - Patient has atrial fibrillation listed under past medical history but I do not see any documentation of this in Cardiology notes. - No documented atrial fibrillation on any EKGs in our system.  Orthostatic Hypotension - Stable.  - Continue home Midodrine 5mg  three times daily.  Hyperlipidemia - On Simvastatin 40mg  daily at home but place on Lipitor 40mg  daily here.  Type 2 Diabetes Mellitus - Management per primary team.  CKD Stage IV - Creatinine 2.57 on admission, today 3.1  . Baseline around 2.2 to 2.4. 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 - Will stop DAPT for now. Per Dr Haroldine Laws - patient does NOT have a recent cardiac stent or other absolute indication for Plavix or other anti-platelet agents. We have thus stopped ASA and Plavix and can undergo SDH evacuation if needed.  - Neurosurgery following.   Unexplained Weight Loss - Patient has lost 20 lbs over the last 2 months. There was concern for underlying malignancy.  - Chest/abdominal/pelvic CT showed prominent mediastinal lymph nodes but no obvious malignancy.  - Palliative Care has been consulted.  Tobacco Abuse - Patient continues to smoke. - Complete cessation is recommended.  Hyperkalemia  -K 5.3  -Not on K supplements.  -Given lokelma  Palliative Care consulted     Length of Stay: 1  Amy Clegg, NP  01/20/2021, 1:25 PM  Advanced Heart Failure Team Pager [phone] (M-F; 7a - 5p)  Please contact [facility] for night-coverage after hours (5p -7a ) and weekends on [email or web address]   Patient seen and examined with the above-signed Advanced Practice Provider and/or Housestaff. I personally reviewed laboratory data, imaging studies and relevant notes. I independently examined the  patient and formulated the important aspects of the plan. I have edited the note to reflect any of my changes or salient points. I have personally discussed the plan with the patient and/or family.  Denies SOB, orthopnea or PND  General:  Weak appearing. No resp difficulty HEENT: normal Neck: supple. no JVD. Carotids 2+ bilat; no bruits. No lymphadenopathy or thryomegaly appreciated. Cor: PMI nondisplaced. Regular rate & rhythm. No rubs, gallops or murmurs. Lungs: clear Abdomen: soft, nontender, nondistended. No hepatosplenomegaly. No bruits or masses. Good bowel sounds. Extremities: no cyanosis, clubbing, rash, edema Neuro: alert & orientedx3, cranial nerves grossly intact. moves all 4 extremities w/o difficulty. Affect pleasant  HF and CAD stable. We have stopped Plavix and ASA. Ok to proceed with SDH evacuation if this is technically feasible and if  is felt to provide him any benefit with his continued systemic deterioration.   We will from a distance. Please call with any questions.   Glori Bickers, MD  4:23 PM

## 2021-01-20 NOTE — Progress Notes (Addendum)
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  GOAL:   Patient will meet greater than or equal to 90% of their needs  MONITOR:   Supplement acceptance,Diet advancement,Labs,PO intake,Weight trends,Skin,I & O's  REASON FOR ASSESSMENT:   Consult,Malnutrition Screening Tool Assessment of nutrition requirement/status  ASSESSMENT:   [name] is a 80 y.o. male with medical history significant of CVA; HTN; HLD: DM; CKD; CAD (not a candidate for CABG) chronic systolic CHF; and afib presenting with  SOB.  He can't remember things, staggering, losing weight unintentionally, no appetite.  +SOB, does not wear home O2.  He had 2 episodes of CP this week - sharp pain with associated SOB, unable to breathe in or out.  Pain was on his left chest.  No dysphagia.  No abdominal pain.  He had diarrhea yesterday but does not usually have this.  No fevers.  Occasional urge incontinence.  Pt admitted with NSTEMI.  4/27- head CT revealed bilateral SDHs  Reviewed I/O's: +123 ml x 24 hours  Spoke with pt and wife at bedside. Pt wife provided most of the history. Pt has had a decreased appetite over the past month secondary to early satiety. She shares that pt would typically consume a bowl of oatmeal in the morning with a lot of encouragement, but eats very little else as the day progressed. Pt wife would cook, however, he would refuse the eat. She also shared that solid foods would  often collect in his mouth and become difficult for him to chew and swallow.   Pt reports he ate pancakes for breakfast, but wife unsure if he ate them. Noted meal completion documented at 75%. Lunch tray at bedside was unattempted.  Per pt wife, she estimates that pt has lost about 30 pounds over the past month. Reviewed wt hx; pt has experienced a 5.5% wt loss over the past month, which is significant for time frame.  Discussed importance of good meal and supplement intake to promote healing. They are amenable to Ensure Enlive supplements.   Medications reviewed and include demadex.   Labs reviewed: K: 5.3, CBGS: 93-235 (inpatient orders for glycemic control are 0-5 units insulin aspart TID with meals).   NUTRITION - FOCUSED PHYSICAL EXAM:  Flowsheet Row Most Recent Value  Orbital Region Moderate depletion  Upper Arm Region Severe depletion  Thoracic and Lumbar Region Severe depletion  Buccal Region Moderate depletion  Temple Region Severe depletion  Clavicle Bone Region Severe depletion  Clavicle and Acromion Bone Region Severe depletion  Scapular Bone Region Severe depletion  Dorsal Hand Moderate depletion  Patellar Region Severe depletion  Anterior Thigh Region Severe depletion  Posterior Calf Region Severe depletion  Edema (RD Assessment) None  Hair Reviewed  Eyes Reviewed  Mouth Reviewed  Skin Reviewed  Nails Reviewed       Diet Order:   Diet Order            Diet heart healthy/carb modified Room service appropriate? Yes; Fluid consistency: Thin  Diet effective now  EDUCATION NEEDS:   Education needs have been addressed  Skin:  Skin Assessment: Reviewed RN Assessment  Last BM:  01/19/21  Height:   Ht Readings from Last 1 Encounters:  01/20/21 5\' 9"  (1.753 m)    Weight:   Wt Readings from Last 1 Encounters:  01/20/21 64.7 kg    Ideal Body Weight:  72.7 kg  BMI:  Body mass index is 21.06 kg/m.  Estimated Nutritional Needs:    Kcal:  1950-2150  Protein:  85-100 grams  Fluid:  > 1.9 L    Loistine Chance, RD, LDN, [place] Registered Dietitian II Certified Diabetes Care and Education Specialist Please refer to [facility] for RD and/or RD on-call/weekend/after hours pager

## 2021-01-20 NOTE — Plan of Care (Signed)

## 2021-01-20 NOTE — Progress Notes (Signed)
  Appreciate NSU evaluation.  Per the note on 4/27: "The risks of stopping the plavix is much higher than the subdural hematomas."  The patient does NOT have a recent cardiac stent or other absolute indication for Plavix or other anti-platelet agents. We have thus stopped ASA and Plavix and can undergo SDH evacuation if this is technically feasible and if is felt to provide him any benefit with his continued systemic deterioration.   Glori Bickers, MD  9:57 AM

## 2021-01-20 NOTE — Consult Note (Signed)
Consultation Note Date: 01/20/2021   Patient Name: [name]  DOB: [birth year]  MRN: [id number]  Age / Sex: 80 y.o., male  PCP: Prince Solian, MD Referring Physician: Bonnielee Haff, MD  Reason for Consultation: Establishing goals of care  HPI/Patient Profile: 80 y.o. male  with past medical history of chronic systolic CHF, CAD (not a candidate for CABG), atrial fibrillation, CVA, CKD stage 4, HTN, HLD, and type 2 diabetes. He presented to the emergency department on 01/18/2021 with chest pain.  ED Course: Troponin 1187-> 1169, BNP 3838, creatinine 2.57. Chest x-ray showed small left pleural effusion with left mid to lower lung field atelectasis versus developing infiltrate.  Admitted to [facility] with NSTEMI, acute on chronic combined CHF, and failure to thrive.   Clinical Assessment and Goals of Care: I have reviewed medical records including EPIC notes, labs and imaging, and discussed with bedside RN. She reports his oral intake has been very minimal today.   I met at bedside with patient and wife/[name] to discuss diagnosis, prognosis, GOC, EOL wishes, disposition, and options. Patient is intermittently dozing during my visit; discussion is mostly had with his wife.   I introduced Palliative Medicine as specialized medical care for people living with serious illness. It focuses on providing relief from the symptoms and stress of a serious illness.   We discussed a brief life review of the patient. [name] and [name] have been married for 29 years. They do not have any children together, but [name] does have 2 daughters from his previous marriage. 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   As far as functional and nutritional status, [name] reports a decline over the past few weeks and months. He has become progressively weaker and more fatigued. He has a poor appetite and subsequent  unintentional weight loss. [name] states he used to weigh around 200 lbs, now only around 140 lbs. Prior to admission, he was able to ambulate without assistance, occasionally requiring a cane. He performs ADLs independently.   We discussed his current illness and what it means in the larger context of his ongoing co-morbidities.  Natural disease trajectory of advanced heart failure was discussed, emphasizing that it is a progressive and life-limiting illness that gets worse over time. Discussed that patient's fatigue and weakness is likely due to his worsening heart failure. [name] verbalizes understanding that his heart is "very weak".   I attempted to elicit values and goals of care important to the patient. His short-term goal is to return home.   Advanced directives, concepts specific to code status, artifical feeding and hydration, and rehospitalization were considered and discussed. MOST form was introduced.   The difference between full scope medical intervention and comfort care was considered. The patient and wife are interested in all aggressive medical interventions offered. We did discuss code status. I explained that DNR/DNI does not change the medical plan and it only comes into effect after a person has arrested (died).  It is a protective measure to keep [place]  from harming the patient in their last moments of life. Patient/family wish for him to remain full code at this time with understanding that full code includes CPR, defibrillation, ACLS medications, and intubation. Patient is able to set limits around this - he would only want a time trial of mechanical intubation. He would not want a tracheostomy.   Questions and concerns were addressed.  The family was encouraged to call with questions or concerns.    Primary decision maker: Patient appears to have limited ability to participate in decision making and needs support from his wife for any complex medical decisions.     SUMMARY OF  RECOMMENDATIONS    Full code full scope treatment  Continue current medical treatment  Goal of care is to return home  PMT will continue to follow  Code Status/Advance Care Planning:  DNR  Symptom Management:   Per primary team  Palliative Prophylaxis:   Oral Care  Additional Recommendations (Limitations, Scope, Preferences):  Full Scope Treatment and No Tracheostomy  Psycho-social/Spiritual:   Created space and opportunity for patient and family to express thoughts and feelings regarding patient's current medical situation.   Emotional support provided   Prognosis:   Difficult to determine  Discharge Planning: goal is home      Primary Diagnoses: Present on Admission: . NSTEMI (non-ST elevated myocardial infarction) ([place]) . Hyperlipidemia . DM (diabetes mellitus), type 2 with renal complications ([place]) . Stage 4 chronic kidney disease ([place]) . 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 Failure to thrive in adult   I have reviewed the medical record, interviewed the patient and family, and examined the patient. The following aspects are pertinent.  Past Medical History:  Diagnosis Date  . Allergy   . Atrial fibrillation ([place])   . Cataract   . CHF (congestive heart failure) ([place])   . Chronic kidney disease    Renal Ins[place]  . Colon polyps 2012   Colonoscopy  . Diabetes mellitus    type 2  . Diverticulosis 2012   Colonoscopy   . Hyperglycemia 04/2019  . Hyperlipidemia   . Hypertension   . LBBB (left bundle branch block)   . Pneumonia 2015   3 times  . Stroke [facility]) 2015    Family History  Problem Relation Age of Onset  . Diabetes Mellitus II Mother   . Heart disease Brother        before age 71  . Colon cancer Neg Hx   . Esophageal cancer Neg Hx   . Stomach cancer Neg Hx   . Anesthesia problems Neg Hx   . Hypotension Neg Hx   . Malignant hyperthermia Neg Hx   . Pseudochol deficiency Neg Hx    Scheduled Meds: . atorvastatin  40 mg Oral  Daily  . feeding supplement  237 mL Oral BID BM  . insulin aspart  0-5 Units Subcutaneous QHS  . insulin aspart  0-9 Units Subcutaneous TID WC  . midodrine  5 mg Oral TID WC  . nicotine  14 mg Transdermal Daily  . sodium chloride flush  3 mL Intravenous Q12H  . torsemide  20 mg Oral Daily   Continuous Infusions: . sodium chloride     PRN Meds:.sodium chloride, acetaminophen, nitroGLYCERIN, ondansetron (ZOFRAN) IV, sodium chloride flush Medications Prior to Admission:  Prior to Admission medications   Medication Sig Start Date End Date Taking? Authorizing Provider  aspirin EC 81 MG EC tablet Take 1 tablet (81 mg total) by mouth daily. 02/11/19  Yes Nettey,  Evalee Jefferson, MD  clopidogrel (PLAVIX) 75 MG tablet Take 75 mg by mouth daily.   Yes [provider]  insulin aspart protamine- aspart (NOVOLOG MIX 70/30) (70-30) 100 UNIT/ML injection Inject 8-16 Units into the skin 2 (two) times daily. Sliding scale   Yes [provider]  Insulin Pen Needle 32G X 4 MM MISC Use as directed daily at bedtime. 05/23/19  Yes Hongalgi, Lenis Dickinson, MD  iron polysaccharides (FERREX 150) 150 MG capsule Take 1 capsule (150 mg total) by mouth daily. 05/23/19  Yes Hongalgi, Lenis Dickinson, MD  midodrine (PROAMATINE) 5 MG tablet Take 1 tablet (5 mg total) by mouth 3 (three) times daily with meals. 12/08/20  Yes Bensimhon, Shaune Pascal, MD  Multiple Vitamin (MULTIVITAMIN WITH MINERALS) TABS tablet Take 1 tablet by mouth daily. 05/23/19  Yes Hongalgi, Lenis Dickinson, MD  simvastatin (ZOCOR) 40 MG tablet Take 40 mg by mouth daily.   Yes [provider]  torsemide (DEMADEX) 20 MG tablet Take 1 tablet (20 mg total) by mouth daily. Patient taking differently: Take 20 mg by mouth every other day. 12/29/20  Yes Bensimhon, Shaune Pascal, MD   Allergies  Allergen Reactions  . Bee Venom Itching, Swelling and Other (See Comments)    Dizziness     Review of Systems  Constitutional: Positive for appetite change.  Neurological:  Positive for weakness.    Physical Exam Vitals reviewed.  Constitutional:      General: He is not in acute distress.    Appearance: He is ill-appearing.  Cardiovascular:     Rate and Rhythm: Normal rate and regular rhythm.  Pulmonary:     Effort: Pulmonary effort is normal.  Neurological:     Mental Status: He is alert.     Motor: Weakness present.     Vital Signs: BP 128/69 (BP Location: Left Arm)   Pulse 96   Temp 98 F (36.7 C)   Resp 16   Ht _0  (1.753 m)   Wt 64.7 kg Comment: scale C  SpO2 98%   BMI 21.06 kg/m  Pain Scale: 0-10   Pain Score: 0-No pain   SpO2: SpO2: 98 % O2 Device:SpO2: 98 % O2 Flow Rate: .   IO: Intake/output summary:   Intake/Output Summary (Last 24 hours) at 01/20/2021 1624 Last data filed at 01/20/2021 7573 Gross per 24 hour  Intake 123 ml  Output --  Net 123 ml    LBM: Last BM Date: 01/19/21 Baseline Weight: Weight: 64.7 kg (scale C) Most recent weight: Weight: 64.7 kg (scale C)      Palliative Assessment/Data: PPS 30-40%     Time In: 16:00 Time Out: 17:11 Time Total: 71 minutes Greater than 50%  of this time was spent counseling and coordinating care related to the above assessment and plan.  Signed by: Lavena Bullion, NP   Please contact Palliative Medicine Team phone at [phone] for questions and concerns.  For individual provider: See Shea Evans

## 2021-01-20 NOTE — Evaluation (Signed)
Physical Therapy Evaluation Patient Details Name: [name] MRN: [id number] DOB: 1941/02/08 Today's Date: 01/20/2021   History of Present Illness  80 y.o. male presents to [facility] ED on 01/18/2021 with SOB, impaired memory, gait instability, unintentional weight loss, and recent episodes of chest pain. Pt admitted with NSTEMI, CHF, FTT. Head CT revealed bilateral SDH. PMH includes CVA; HTN; HLD: DM; CKD; CAD (not a candidate for CABG) chronic systolic CHF; and afib.  Clinical Impression  Pt experiences dizziness with sit>stand resolving quickly, with VSS on RA. Pt demonstrates increased swaying during ambulation with SPC, reaching for hallway railings and door frames for additional stability. Improvement noted when gait trial performed with RW. Pt will benefit from gait training with RW for improved safety and stability when ambulating. Pt will benefit from acute PT to address overall strength, endurance, balance, and activity tolerance to reduce falls risk and improve independent mobility.    Follow Up Recommendations Home health PT    Equipment Recommendations  None recommended by PT    Recommendations for Other Services       Precautions / Restrictions Precautions Precautions: Fall Restrictions Weight Bearing Restrictions: No      Mobility  Bed Mobility Overal bed mobility: Modified Independent             General bed mobility comments: HOB elevated.    Transfers Overall transfer level: Needs assistance Equipment used: Rolling walker (2 wheeled);Straight cane Transfers: Sit to/from Stand Sit to Stand: Min guard            Ambulation/Gait Ambulation/Gait assistance: Min guard Gait Distance (Feet): 75 Feet Assistive device: Rolling walker (2 wheeled);Straight cane Gait Pattern/deviations: Step-to pattern;Decreased stride length;Drifts right/left Gait velocity: reduced Gait velocity interpretation: <1.31 ft/sec, indicative of household ambulator General Gait  Details: Pt uses handrails and doorways in hallway for UE support and stability when using cane, requiring multiple short standing rest breaks. Pt encouraged to attempt gait with walker with decreased drifting and improved stability.  Stairs            Wheelchair Mobility    Modified Rankin (Stroke Patients Only)       Balance Overall balance assessment: Needs assistance Sitting-balance support: Bilateral upper extremity supported;No upper extremity supported;Feet supported Sitting balance-Leahy Scale: Fair     Standing balance support: Single extremity supported;Bilateral upper extremity supported;During functional activity Standing balance-Leahy Scale: Poor Standing balance comment: Pt requries UE support for stability with standing, using SPC.                             Pertinent Vitals/Pain Pain Assessment: No/denies pain    Home Living Family/patient expects to be discharged to:: Private residence Living Arrangements: Spouse/significant other Available Help at Discharge: Family;Available 24 hours/day Type of Home: House Home Access: Stairs to enter Entrance Stairs-Rails: Right Entrance Stairs-Number of Steps: 2 Home Layout: Two level;Able to live on main level with bedroom/bathroom Home Equipment: Gilford Rile - 2 wheels;Cane - single point      Prior Function Level of Independence: Independent with assistive device(s)         Comments: Uses cane, particularly with stairs.     Hand Dominance        Extremity/Trunk Assessment   Upper Extremity Assessment Upper Extremity Assessment: Defer to OT evaluation    Lower Extremity Assessment Lower Extremity Assessment: Generalized weakness    Cervical / Trunk Assessment Cervical / Trunk Assessment: Normal  Communication   Communication: No difficulties  Cognition Arousal/Alertness: Awake/alert Behavior During Therapy: WFL for tasks assessed/performed Overall Cognitive Status: No  family/caregiver present to determine baseline cognitive functioning                                 General Comments: Pt demonstrates some confusion, questions what we are doing next during session after being told. May have some short term memory deficits.      General Comments General comments (skin integrity, edema, etc.): Pt reports dizziness with standing, BP taken 136/89. Pt reports feeling unstable on his feet during session. BP taken at end of session 132/79.    Exercises     Assessment/Plan    PT Assessment Patient needs continued PT services  PT Problem List Decreased strength;Decreased activity tolerance;Decreased balance;Decreased mobility;Decreased knowledge of use of DME;Decreased safety awareness       PT Treatment Interventions DME instruction;Gait training;Stair training;Functional mobility training;Therapeutic activities;Therapeutic exercise;Balance training;Patient/family education    PT Goals (Current goals can be found in the Care Plan section)  Acute Rehab PT Goals Patient Stated Goal: Return home PT Goal Formulation: With patient Time For Goal Achievement: 02/03/21 Potential to Achieve Goals: Good    Frequency Min 3X/week   Barriers to discharge        Co-evaluation               AM-PAC PT "6 Clicks" Mobility  Outcome Measure Help needed turning from your back to your side while in a flat bed without using bedrails?: None Help needed moving from lying on your back to sitting on the side of a flat bed without using bedrails?: None Help needed moving to and from a bed to a chair (including a wheelchair)?: A Little Help needed standing up from a chair using your arms (e.g., wheelchair or bedside chair)?: A Little Help needed to walk in hospital room?: A Little Help needed climbing 3-5 steps with a railing? : A Lot 6 Click Score: 19    End of Session Equipment Utilized During Treatment: Gait belt Activity Tolerance: Patient  limited by fatigue;Patient tolerated treatment well Patient left: in chair;with chair alarm set;with call bell/phone within reach Nurse Communication: Mobility status PT Visit Diagnosis: Unsteadiness on feet (R26.81);Difficulty in walking, not elsewhere classified (R26.2);Other abnormalities of gait and mobility (R26.89);Muscle weakness (generalized) (M62.81);Dizziness and giddiness (R42)    Time: 3295-1884 PT Time Calculation (min) (ACUTE ONLY): 29 min   Charges:   PT Evaluation $PT Eval Low Complexity: 1 Low PT Treatments $Gait Training: 8-22 mins        Acute Rehab  Pager: [phone]   Garwin Brothers, SPT  01/20/2021, 12:29 PM

## 2021-01-20 NOTE — Progress Notes (Signed)
[facility] PROGRESS NOTE   Alik Mawson FIE:[id number] DOB: May 10, 1941 DOA: 01/18/2021  PCP: Prince Solian, MD  Brief History/Interval Summary: [name] is a 80 y.o. male with medical history significant of CVA; HTN; HLD: DM; CKD; CAD (not a candidate for CABG) chronic systolic CHF; and afib presenting with  SOB.  He can't remember things, staggering, losing weight unintentionally, no appetite.  +SOB, does not wear home O2.  He had 2 episodes of CP this week - sharp pain with associated SOB, unable to breathe in or out.    Went to the ED for these symptoms.  Consultants: Cardiology.  Neurosurgery.  Procedures: None yet  Antibiotics: Anti-infectives (From admission, onward)   None      Subjective/Interval History: Patient noted to be pleasantly confused.  Denies any chest pain currently.  Admits to shortness of breath mainly with exertion.  No nausea vomiting.  Denies any headaches.    Assessment/Plan:  NSTEMI/chronic systolic CHF Patient noted to have elevated troponin.  EKG was noted to be abnormal.  Cardiology was consulted.  It does not appear that any testing is planned.  Patient denies any chest pain this morning.  His antiplatelet agents are on hold due to subdural hematoma.  Remains on statin. From a CHF standpoint he appears to be euvolemic.  He was initially placed on IV diuretics but changed over to his usual torsemide by cardiology.  Echocardiogram shows EF to be less than 20%.  Subdural hematoma Seen by neurosurgery.  Patient appears to be stable from a neurological0 standpoint.  No surgery is being planned.  Conservative management.  Antiplatelet agents on hold.  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  He is disoriented currently.  No focal deficits noted.  Failure to thrive Patient has been experiencing confusion weakness and weight loss.  Likely due to his multiple chronic  comorbidities.  History of hypotension Midodrine being continued.  Diabetes mellitus type 2 HbA1c 1.9.  Monitor CBGs.  Apparently on 70/30 insulin at home but has not been taking it regularly.  History of atrial fibrillation Not on rate control medications.  No anticoagulation.  Chronic kidney disease stage IV/hyperkalemia Followed by [name] in the outpatient setting.  Baseline creatinine appears to be around 2.5.  Noted to be higher at 3.0 this morning.  Avoid nephrotoxic agents.  Monitor urine output.  Patient likely not a candidate for hemodialysis considering his other significant comorbidities including memory issues. Lokelma x1.  Recheck potassium tomorrow.  Tobacco dependence Reports that he is down to 3 cigarettes/day.  Declined patch.  Goals of care Palliative care has been consulted.   DVT Prophylaxis: SCDs Code Status: Full code Family Communication: No family at bedside Disposition Plan:   Unclear for now  Status is: Inpatient  Remains inpatient appropriate because:Altered mental status, Ongoing diagnostic testing needed not appropriate for outpatient work up and Unsafe d/c plan   Dispo: The patient is from: Home              Anticipated d/c is to: To be determined              Patient currently is not medically stable to d/c.   Difficult to place patient No      Medications:  Scheduled: . atorvastatin  40 mg Oral Daily  . feeding supplement  237 mL Oral BID BM  . insulin aspart  0-5 Units Subcutaneous QHS  . insulin aspart  0-9  Units Subcutaneous TID WC  . midodrine  5 mg Oral TID WC  . sodium chloride flush  3 mL Intravenous Q12H  . torsemide  20 mg Oral Daily   Continuous: . sodium chloride     RCV:ELFYBO chloride, acetaminophen, nitroGLYCERIN, ondansetron (ZOFRAN) IV, sodium chloride flush   Objective:  Vital Signs  Vitals:   01/20/21 0057 01/20/21 0412 01/20/21 0710 01/20/21 1118  BP: 110/76 132/85 122/80 128/69  Pulse: 92 91 91 96   Resp: 18 18 17 16   Temp: 97.6 F (36.4 C) 97.8 F (36.6 C) 98.3 F (36.8 C) 98 F (36.7 C)  TempSrc: Oral Oral    SpO2: 98% 100% 94% 98%  Weight:      Height:        Intake/Output Summary (Last 24 hours) at 01/20/2021 1128 Last data filed at 01/20/2021 0943 Gross per 24 hour  Intake 123 ml  Output --  Net 123 ml   Filed Weights   01/20/21 0054  Weight: 64.7 kg    General appearance: Awake alert.  In no distress.  Pleasantly confused Resp: Clear to auscultation bilaterally.  Normal effort Cardio: S1-S2 is normal regular.  No S3-S4.  No rubs murmurs or bruit GI: Abdomen is soft.  Nontender nondistended.  Bowel sounds are present normal.  No masses organomegaly Extremities: No edema. Neurologic: Disoriented.  No focal neurological deficits.    Lab Results:  Data Reviewed: I have personally reviewed following labs and imaging studies  CBC: Recent Labs  Lab 01/18/21 2241 01/20/21 0332  WBC 7.1 7.1  NEUTROABS 4.8  --   HGB 13.1 13.5  HCT 41.9 42.8  MCV 95.9 94.9  PLT 201 175    Basic Metabolic Panel: Recent Labs  Lab 01/18/21 2241 01/20/21 0332  NA 140 137  K 4.9 5.3*  CL 102 102  CO2 26 21*  GLUCOSE 220* 79  BUN 49* 65*  CREATININE 2.57* 3.06*  CALCIUM 9.5 9.0    GFR: Estimated Creatinine Clearance: 18.5 mL/min (A) (by C-G formula based on SCr of 3.06 mg/dL (H)).  Liver Function Tests: Recent Labs  Lab 01/18/21 2241  AST 49*  ALT 36  ALKPHOS 76  BILITOT 0.9  PROT 6.8  ALBUMIN 3.5    Coagulation Profile: Recent Labs  Lab 01/20/21 0332  INR 1.2     HbA1C: Recent Labs    01/19/21 0950  HGBA1C 7.9*    CBG: Recent Labs  Lab 01/19/21 1840 01/19/21 2143 01/20/21 0559 01/20/21 1119  GLUCAP 235* 196* 93 220*    Lipid Profile: Recent Labs    01/19/21 0951  CHOL 144  HDL 43  LDLCALC 81  TRIG 98  CHOLHDL 3.3    Thyroid Function Tests: Recent Labs    01/19/21 0950  TSH 1.962      Recent Results (from the past  240 hour(s))  Resp Panel by RT-PCR (Flu A&B, Covid) Nasopharyngeal Swab     Status: None   Collection Time: 01/20/21  7:11 AM   Specimen: Nasopharyngeal Swab; Nasopharyngeal(NP) swabs in vial transport medium  Result Value Ref Range Status   SARS Coronavirus 2 by RT PCR NEGATIVE NEGATIVE Final    Comment: (NOTE) SARS-CoV-2 target nucleic acids are NOT DETECTED.  The SARS-CoV-2 RNA is generally detectable in upper respiratory specimens during the acute phase of infection. The lowest concentration of SARS-CoV-2 viral copies this assay can detect is 138 copies/mL. A negative result does not preclude SARS-Cov-2 infection and should not be used as  the sole basis for treatment or other patient management decisions. A negative result may occur with  improper specimen collection/handling, submission of specimen other than nasopharyngeal swab, presence of viral mutation(s) within the areas targeted by this assay, and inadequate number of viral copies(<138 copies/mL). A negative result must be combined with clinical observations, patient history, and epidemiological information. The expected result is Negative.  Fact Sheet for Patients:  EntrepreneurPulse.com.au  Fact Sheet for Healthcare Providers:  IncredibleEmployment.be  This test is no t yet approved or cleared by the [place] FDA and  has been authorized for detection and/or diagnosis of SARS-CoV-2 by FDA under an Emergency Use Authorization (EUA). This EUA will remain  in effect (meaning this test can be used) for the duration of the COVID-19 declaration under Section 564(b)(1) of the Act, 21 U.S.C.section 360bbb-3(b)(1), unless the authorization is terminated  or revoked sooner.       Influenza A by PCR NEGATIVE NEGATIVE Final   Influenza B by PCR NEGATIVE NEGATIVE Final    Comment: (NOTE) The Xpert Xpress SARS-CoV-2/FLU/RSV plus assay is intended as an aid in the diagnosis of influenza  from Nasopharyngeal swab specimens and should not be used as a sole basis for treatment. Nasal washings and aspirates are unacceptable for Xpert Xpress SARS-CoV-2/FLU/RSV testing.  Fact Sheet for Patients: EntrepreneurPulse.com.au  Fact Sheet for Healthcare Providers: IncredibleEmployment.be  This test is not yet approved or cleared by the [place] FDA and has been authorized for detection and/or diagnosis of SARS-CoV-2 by FDA under an Emergency Use Authorization (EUA). This EUA will remain in effect (meaning this test can be used) for the duration of the COVID-19 declaration under Section 564(b)(1) of the Act, 21 U.S.C. section 360bbb-3(b)(1), unless the authorization is terminated or revoked.  Performed at [facility], [place] [address], [place],  [address]       Radiology Studies: CT ABDOMEN PELVIS WO CONTRAST  Result Date: 01/19/2021 CLINICAL DATA:  Shortness of breath. Unintentional weight loss. Tobacco use. EXAM: CT CHEST, ABDOMEN AND PELVIS WITHOUT CONTRAST TECHNIQUE: Multidetector CT imaging of the chest, abdomen and pelvis was performed following the standard protocol without IV contrast. COMPARISON:  CT chest 04/01/2019 FINDINGS: CT CHEST FINDINGS Cardiovascular: Cardiac enlargement. No pericardial effusions. Prominent coronary artery and minimal aortic calcification. Normal caliber thoracic aorta. Mediastinum/Nodes: Esophagus is decompressed. Moderately prominent mediastinal lymph nodes without pathologic enlargement, likely reactive. Similar appearance to previous study. Lungs/Pleura: Small bilateral pleural effusions. Basilar atelectasis. Mild subpleural fibrosis in the lungs. Mild interstitial pattern likely due to edema. Patchy focal airspace disease in the right upper lung may represent multifocal pneumonia or early edema. No pneumothorax. Musculoskeletal: Degenerative changes in the thoracic spine. Normal alignment. No  vertebral compression deformities. Sternum and ribs are nondepressed. CT ABDOMEN PELVIS FINDINGS Hepatobiliary: No focal liver abnormality is seen. No gallstones, gallbladder wall thickening, or biliary dilatation. Pancreas: Unremarkable. No pancreatic ductal dilatation or surrounding inflammatory changes. Spleen: Normal in size without focal abnormality. Adrenals/Urinary Tract: Left adrenal gland nodule with fat attenuation consistent with benign adenoma. No change since prior study. Kidneys are symmetrical in size. No hydronephrosis or hydroureter. No renal or ureteral stones. Bladder is normal for degree of distention. Stomach/Bowel: Stomach is within normal limits. Appendix appears normal. No evidence of bowel wall thickening, distention, or inflammatory changes. Vascular/Lymphatic: Aortic atherosclerosis. No enlarged abdominal or pelvic lymph nodes. Reproductive: Prostate gland is mildly enlarged. Penile prosthesis with retention reservoir or in the anterior pelvis. Other: No free air or free fluid  in the abdomen. Lipoma in the subcutaneous fat superficial to the left flank musculature. Vascular calcifications in the groin regions. Surgical clips in the right groin. Musculoskeletal: No acute or significant osseous findings. IMPRESSION: 1. Cardiac enlargement with mild interstitial edema and small bilateral pleural effusions. 2. Patchy focal airspace disease in the right upper lung may represent multifocal pneumonia or early alveolar edema. 3. Mild subpleural fibrosis in the lungs. 4. Prominent mediastinal lymph nodes without pathologic enlargement, likely reactive. 5. Coronary artery and minimal aortic calcification. Aortic Atherosclerosis (ICD10-I70.0). Electronically Signed   By: Lucienne Capers M.D.   On: 01/19/2021 01:51   DG Chest 2 View  Result Date: 01/18/2021 CLINICAL DATA:  80 year old male with shortness of breath. EXAM: CHEST - 2 VIEW COMPARISON:  Chest radiograph dated 12/14/2020. FINDINGS:  Faint left mid to lower lung field interstitial densities may represent atelectasis but concerning for developing infiltrate. There is a small left pleural effusion. No pneumothorax. Mild cardiomegaly. Atherosclerotic calcification of the aorta. No acute osseous pathology. Degenerative changes of the spine. IMPRESSION: Small left pleural effusion with left mid to lower lung field atelectasis versus developing infiltrate. Electronically Signed   By: Anner Crete M.D.   On: 01/18/2021 23:15   CT HEAD WO CONTRAST  Result Date: 01/19/2021 CLINICAL DATA:  Ataxia.  Recent CVA. EXAM: CT HEAD WITHOUT CONTRAST TECHNIQUE: Contiguous axial images were obtained from the base of the skull through the vertex without intravenous contrast. COMPARISON:  CT head without contrast 02/28/2016. FINDINGS: Brain: Bilateral intermediate density the extra-axial fluid collections are present over the convexities. Left-sided collection measures up to 12 mm on coronal images. Right-sided collection measures 7 mm maximally on coronal images. There is some mass effect over the left parietal lobe with partial effacement sulci. No midline shift present. Moderate atrophy white matter disease is otherwise stable. Remote lacunar infarct noted in the right thalamus. White matter changes extend into the brainstem. Cerebellum is unremarkable. Vascular: Atherosclerotic calcifications present the cavernous internal carotid arteries. No hyperdense vessel is present. Skull: Calvarium is intact. No focal lytic or blastic lesions are present. No significant extracranial soft tissue lesion is present. Sinuses/Orbits: The paranasal sinuses and mastoid air cells are clear. Bilateral lens replacements are noted. Globes and orbits are otherwise unremarkable. IMPRESSION: 1. Bilateral intermediate density extra-axial fluid collections over the convexities most compatible with subacute or early chronic subdural hematomas. 2. Mass effect over the left parietal  lobe with partial effacement of sulci. No midline shift. 3. Stable atrophy and white matter disease. 4. Remote lacunar infarct of the right thalamus. Critical Value/emergent results were called by telephone at the time of interpretation on 01/19/2021 at 11:40 am to provider [facility] , who verbally acknowledged these results. Electronically Signed   By: San Morelle M.D.   On: 01/19/2021 11:40   CT Chest Wo Contrast  Result Date: 01/19/2021 CLINICAL DATA:  Shortness of breath. Unintentional weight loss. Tobacco use. EXAM: CT CHEST, ABDOMEN AND PELVIS WITHOUT CONTRAST TECHNIQUE: Multidetector CT imaging of the chest, abdomen and pelvis was performed following the standard protocol without IV contrast. COMPARISON:  CT chest 04/01/2019 FINDINGS: CT CHEST FINDINGS Cardiovascular: Cardiac enlargement. No pericardial effusions. Prominent coronary artery and minimal aortic calcification. Normal caliber thoracic aorta. Mediastinum/Nodes: Esophagus is decompressed. Moderately prominent mediastinal lymph nodes without pathologic enlargement, likely reactive. Similar appearance to previous study. Lungs/Pleura: Small bilateral pleural effusions. Basilar atelectasis. Mild subpleural fibrosis in the lungs. Mild interstitial pattern likely due to edema. Patchy focal airspace disease in the  right upper lung may represent multifocal pneumonia or early edema. No pneumothorax. Musculoskeletal: Degenerative changes in the thoracic spine. Normal alignment. No vertebral compression deformities. Sternum and ribs are nondepressed. CT ABDOMEN PELVIS FINDINGS Hepatobiliary: No focal liver abnormality is seen. No gallstones, gallbladder wall thickening, or biliary dilatation. Pancreas: Unremarkable. No pancreatic ductal dilatation or surrounding inflammatory changes. Spleen: Normal in size without focal abnormality. Adrenals/Urinary Tract: Left adrenal gland nodule with fat attenuation consistent with benign adenoma. No change  since prior study. Kidneys are symmetrical in size. No hydronephrosis or hydroureter. No renal or ureteral stones. Bladder is normal for degree of distention. Stomach/Bowel: Stomach is within normal limits. Appendix appears normal. No evidence of bowel wall thickening, distention, or inflammatory changes. Vascular/Lymphatic: Aortic atherosclerosis. No enlarged abdominal or pelvic lymph nodes. Reproductive: Prostate gland is mildly enlarged. Penile prosthesis with retention reservoir or in the anterior pelvis. Other: No free air or free fluid in the abdomen. Lipoma in the subcutaneous fat superficial to the left flank musculature. Vascular calcifications in the groin regions. Surgical clips in the right groin. Musculoskeletal: No acute or significant osseous findings. IMPRESSION: 1. Cardiac enlargement with mild interstitial edema and small bilateral pleural effusions. 2. Patchy focal airspace disease in the right upper lung may represent multifocal pneumonia or early alveolar edema. 3. Mild subpleural fibrosis in the lungs. 4. Prominent mediastinal lymph nodes without pathologic enlargement, likely reactive. 5. Coronary artery and minimal aortic calcification. Aortic Atherosclerosis (ICD10-I70.0). Electronically Signed   By: Lucienne Capers M.D.   On: 01/19/2021 01:51   ECHOCARDIOGRAM COMPLETE  Result Date: 01/19/2021    ECHOCARDIOGRAM REPORT   Patient Name:   WILBUR OAKLAND Date of Exam: 01/19/2021 Medical Rec #:  [id number]    Height:       69.0 in Accession #:    [id number]   Weight:       144.2 lb Date of Birth:  1940/11/01   BSA:          1.798 m Patient Age:    71 years     BP:           124/85 mmHg Patient Gender: M            HR:           76 bpm. Exam Location:  Inpatient Procedure: 2D Echo, Cardiac Doppler and Color Doppler Indications:    I50.23 Acute on chronic systolic (congestive) heart failure  History:        Patient has prior history of Echocardiogram examinations, most                 recent  04/12/2020. CHF, Stroke, Arrythmias:Atrial Fibrillation                 and LBBB; Risk Factors:Hypertension, Diabetes and Dyslipidemia.  Sonographer:    Jonelle Sidle Dance Referring Phys: [id number] JENNIFER YATES  Sonographer Comments: Image acquisition challenging due to respiratory motion. IMPRESSIONS  1. Left ventricular ejection fraction, by estimation, is <20%. The left ventricle has severely decreased function. The left ventricle demonstrates global hypokinesis. The left ventricular internal cavity size was mildly dilated. There is mild left ventricular hypertrophy. Left ventricular diastolic parameters are indeterminate.  2. Right ventricular systolic function is moderately reduced. The right ventricular size is normal. There is moderately elevated pulmonary artery systolic pressure. The estimated right ventricular systolic pressure is 48.1 mmHg.  3. Left atrial size was severely dilated.  4. The mitral valve is normal in structure. Mild to moderate mitral valve  regurgitation. No evidence of mitral stenosis.  5. Tricuspid valve regurgitation is mild to moderate.  6. The aortic valve is tricuspid. Aortic valve regurgitation is not visualized. Mild to moderate aortic valve sclerosis/calcification is present, without any evidence of aortic stenosis.  7. The inferior vena cava is dilated in size with <50% respiratory variability, suggesting right atrial pressure of 15 mmHg. FINDINGS  Left Ventricle: Left ventricular ejection fraction, by estimation, is <20%. The left ventricle has severely decreased function. The left ventricle demonstrates global hypokinesis. The left ventricular internal cavity size was mildly dilated. There is mild left ventricular hypertrophy. Left ventricular diastolic parameters are indeterminate. Right Ventricle: The right ventricular size is normal. No increase in right ventricular wall thickness. Right ventricular systolic function is moderately reduced. There is moderately elevated pulmonary  artery systolic pressure. The tricuspid regurgitant velocity is 3.23 m/s, and with an assumed right atrial pressure of 15 mmHg, the estimated right ventricular systolic pressure is 93.5 mmHg. Left Atrium: Left atrial size was severely dilated. Right Atrium: Right atrial size was normal in size. Pericardium: There is no evidence of pericardial effusion. Mitral Valve: The mitral valve is normal in structure. Mild to moderate mitral valve regurgitation. No evidence of mitral valve stenosis. Tricuspid Valve: The tricuspid valve is normal in structure. Tricuspid valve regurgitation is mild to moderate. Aortic Valve: The aortic valve is tricuspid. Aortic valve regurgitation is not visualized. Mild to moderate aortic valve sclerosis/calcification is present, without any evidence of aortic stenosis. Pulmonic Valve: The pulmonic valve was not well visualized. Pulmonic valve regurgitation is not visualized. Aorta: The aortic root and ascending aorta are structurally normal, with no evidence of dilitation. Venous: The inferior vena cava is dilated in size with less than 50% respiratory variability, suggesting right atrial pressure of 15 mmHg. IAS/Shunts: The interatrial septum was not well visualized.  LEFT VENTRICLE PLAX 2D LVIDd:         5.60 cm  Diastology LVIDs:         4.80 cm  LV e' medial:    2.39 cm/s LV PW:         1.20 cm  LV E/e' medial:  36.6 LV IVS:        0.90 cm  LV e' lateral:   5.84 cm/s LVOT diam:     2.10 cm  LV E/e' lateral: 15.0 LV SV:         23 LV SV Index:   13 LVOT Area:     3.46 cm  RIGHT VENTRICLE            IVC RV Basal diam:  3.20 cm    IVC diam: 2.10 cm RV Mid diam:    2.30 cm RV S prime:     4.62 cm/s TAPSE (M-mode): 1.0 cm LEFT ATRIUM              Index       RIGHT ATRIUM           Index LA diam:        5.00 cm  2.78 cm/m  RA Area:     14.30 cm LA Vol (A2C):   147.0 ml 81.76 ml/m RA Volume:   41.10 ml  22.86 ml/m LA Vol (A4C):   70.9 ml  39.44 ml/m LA Biplane Vol: 110.0 ml 61.18 ml/m   AORTIC VALVE LVOT Vmax:   39.50 cm/s LVOT Vmean:  26.700 cm/s LVOT VTI:    0.067 m  AORTA Ao Root diam: 3.10 cm Ao  Asc diam:  3.20 cm MITRAL VALVE                 TRICUSPID VALVE MV Area (PHT): 3.21 cm      TR Peak grad:   41.7 mmHg MV Decel Time: 236 msec      TR Vmax:        323.00 cm/s MR Peak grad:    78.9 mmHg MR Mean grad:    52.0 mmHg   SHUNTS MR Vmax:         444.00 cm/s Systemic VTI:  0.07 m MR Vmean:        339.0 cm/s  Systemic Diam: 2.10 cm MR PISA:         0.57 cm MR PISA Eff ROA: 5 mm MR PISA Radius:  0.30 cm MV E velocity: 87.40 cm/s Oswaldo Milian MD Electronically signed by Oswaldo Milian MD Signature Date/Time: 01/19/2021/7:55:38 PM    Final        LOS: 1 day   [facility] Pager on [email or web address]  01/20/2021, 11:28 AM

## 2021-01-20 NOTE — TOC Initial Note (Signed)
Transition of Care (TOC) - Initial/Assessment Note  Heart Failure   Patient Details  Name: [name] MRN: [id number] Date of Birth: 05/24/1941  Transition of Care [facility]) CM/SW Contact:    [place], [place] Phone Number: 01/20/2021, 11:36 AM  Clinical Narrative:                 CSW spoke with the patient at bedside and completed a very brief SDOH screening with the patient who denied having any needs at this time but asked if the CSW could speak with his wife as he isn't feeling well and she keeps track of the household. CSW agreed as the patients wife was not present in the room at this time and will outreach Mrs. Mckiddy via phone. The patient reported they do have a PCP and they can get to the pharmacy to pick up their medications. CSW provided the patient with social workers name, number and position and to reach out to [place] as other social needs arise.  CSW attempted to outreach the patients spouse Mrs. Keshawn Fiorito (cell [phone] home [phone]) however nobody answered and CSW left a voicemail for her to return the call.  TOC will continue to follow for d/c needs.     Barriers to Discharge: Continued Medical Work up   Patient Goals and CMS Choice        Expected Discharge Plan and Services   In-house Referral: Clinical Social Work     Living arrangements for the past 2 months: Single Family Home                                      Prior Living Arrangements/Services Living arrangements for the past 2 months: [place]   Patient language and need for interpreter reviewed:: Yes        Need for Family Participation in Patient Care: No (Comment) Care giver support system in place?: No (comment)   Criminal Activity/Legal Involvement Pertinent to Current Situation/Hospitalization: No - Comment as needed  Activities of Daily Living Home Assistive Devices/Equipment: Cane (specify quad or straight),Shower chair with back,Eyeglasses ADL  Screening (condition at time of admission) Patient's cognitive ability adequate to safely complete daily activities?: Yes Is the patient deaf or have difficulty hearing?: No Does the patient have difficulty seeing, even when wearing glasses/contacts?: Yes Does the patient have difficulty concentrating, remembering, or making decisions?: No Patient able to express need for assistance with ADLs?: No Does the patient have difficulty dressing or bathing?: No Independently performs ADLs?: Yes (appropriate for developmental age) Does the patient have difficulty walking or climbing stairs?: No Weakness of Legs: None Weakness of Arms/Hands: None  Permission Sought/Granted                  Emotional Assessment Appearance:: Appears stated age Attitude/Demeanor/Rapport: Engaged Affect (typically observed): Pleasant Orientation: : Oriented to Self,Oriented to Place,Oriented to  Time,Oriented to Situation Alcohol / Substance Use: Not Applicable Psych Involvement: No (comment)  Admission diagnosis:  Acute congestive heart failure (HCC) [I50.9] Elevated troponin [R77.8] NSTEMI (non-ST elevated myocardial infarction) [facility]) [I21.4] Patient Active Problem List   Diagnosis Date Noted  . Acute congestive heart failure ([place]) 01/19/2021  . NSTEMI (non-ST elevated myocardial infarction) ([place]) 01/19/2021  . Failure to thrive in adult 01/19/2021  . Biventricular heart failure ([place]) 09/24/2019  . Protein-calorie malnutrition, severe 05/23/2019  . Hyperosmolar non-ketotic state in patient with type 2  diabetes mellitus ([place]) 05/22/2019  . Uncontrolled diabetes mellitus with hyperglycemia ([place]) 05/21/2019  . Hyperkalemia 05/21/2019  . Acute renal failure superimposed on stage 3 chronic kidney disease ([place]) 05/21/2019  . Prolonged QT interval 05/21/2019  . Acute respiratory failure with hypoxia ([place]) 04/01/2019  . 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 Goals of care, counseling/discussion 04/01/2019  . Shingles outbreak 04/01/2019  . Community acquired pneumonia of left lower lobe of lung 02/05/2019  . Stage 4 chronic kidney disease ([place]) 02/05/2019  . Acute CHF (congestive heart failure) ([place]) 02/05/2019  . Acute on chronic congestive heart failure ([place]) 02/04/2019  . Aftercare following surgery of the circulatory system 08/09/2016  . Status post transmetatarsal amputation of foot, right ([place]) 08/03/2016  . 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) 04/07/2016  . Cellulitis and abscess of foot 03/23/2016  . Diabetic infection of right foot ([place]) 03/23/2016  . Puncture wound of foot, right, complicated 40/04/6760  . Malnutrition of moderate degree 03/23/2016  . AKI (acute kidney injury) ([place])   . Tobacco use disorder 02/28/2016  . Acute CVA (cerebrovascular accident) ([place]) 02/27/2016  . DM (diabetes mellitus), type 2 with renal complications ([place]) 95/05/3266  . Anemia 02/27/2016  . Chronic systolic CHF (congestive heart failure) ([place]) 05/05/2015  . 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 Essential hypertension 07/16/2014  . Former smoker 04/21/2014  . 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 Dizziness 01/23/2014  . Facial droop 01/23/2014  . Benign neoplasm of colon 08/25/2011  . CAD, NATIVE VESSEL 05/23/2010  . DIABETIC  RETINOPATHY 03/21/2010  . Hyperlipidemia 03/21/2010  . NEUROPATHY 03/21/2010  . Primary hypertension 03/21/2010  . LBBB (left bundle branch block) 03/21/2010  . 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 CERVICAL RADICULOPATHY 03/21/2010   PCP:  Prince Solian, MD Pharmacy:   [id number] [address], [place] [address] Phone: [phone] Fax: [phone]     Social Determinants of  Health (SDOH) Interventions Food Insecurity Interventions: Intervention Not Indicated Financial Strain Interventions: Intervention Not Indicated Housing Interventions: Intervention Not Indicated Transportation Interventions: Intervention Not Indicated  Readmission Risk Interventions No flowsheet data found.  Kentrell Guettler, MSW, [place] Heart Failure Social Worker

## 2021-01-21 DIAGNOSIS — I5022 Chronic systolic (congestive) heart failure: Secondary | ICD-10-CM

## 2021-01-21 LAB — CBC
HCT: 38.6 % — ABNORMAL LOW (ref 39.0–52.0)
Hemoglobin: 12.6 g/dL — ABNORMAL LOW (ref 13.0–17.0)
MCH: 30 pg (ref 26.0–34.0)
MCHC: 32.6 g/dL (ref 30.0–36.0)
MCV: 91.9 fL (ref 80.0–100.0)
Platelets: 192 10*3/uL (ref 150–400)
RBC: 4.2 MIL/uL — ABNORMAL LOW (ref 4.22–5.81)
RDW: 14.8 % (ref 11.5–15.5)
WBC: 7 10*3/uL (ref 4.0–10.5)
nRBC: 0.3 % — ABNORMAL HIGH (ref 0.0–0.2)

## 2021-01-21 LAB — GLUCOSE, CAPILLARY
Glucose-Capillary: 183 mg/dL — ABNORMAL HIGH (ref 70–99)
Glucose-Capillary: 261 mg/dL — ABNORMAL HIGH (ref 70–99)
Glucose-Capillary: 251 mg/dL — ABNORMAL HIGH (ref 70–99)
Glucose-Capillary: 284 mg/dL — ABNORMAL HIGH (ref 70–99)

## 2021-01-21 LAB — BASIC METABOLIC PANEL
Anion gap: 15 (ref 5–15)
BUN: 80 mg/dL — ABNORMAL HIGH (ref 8–23)
CO2: 23 mmol/L (ref 22–32)
Calcium: 9.1 mg/dL (ref 8.9–10.3)
Chloride: 99 mmol/L (ref 98–111)
Creatinine, Ser: 3.15 mg/dL — ABNORMAL HIGH (ref 0.61–1.24)
GFR, Estimated: 20 mL/min — ABNORMAL LOW (ref >60)
Glucose, Bld: 207 mg/dL — ABNORMAL HIGH (ref 70–99)
Potassium: 4.7 mmol/L (ref 3.5–5.1)
Sodium: 137 mmol/L (ref 135–145)

## 2021-01-21 MED ORDER — METOCLOPRAMIDE HCL 5 MG/ML IJ SOLN
10.0000 mg | Freq: Once | INTRAMUSCULAR | Status: AC
  Administered 2021-01-21: 10 mg via INTRAVENOUS
  Filled 2021-01-21: qty 2

## 2021-01-21 MED ORDER — PANTOPRAZOLE SODIUM 40 MG IV SOLR
40.0000 mg | Freq: Once | INTRAVENOUS | Status: AC
  Administered 2021-01-21: 40 mg via INTRAVENOUS
  Filled 2021-01-21: qty 40

## 2021-01-21 MED ORDER — METOCLOPRAMIDE HCL 5 MG PO TABS
10.0000 mg | ORAL_TABLET | Freq: Four times a day (QID) | ORAL | Status: DC | PRN
  Administered 2021-01-21: 10 mg via ORAL
  Filled 2021-01-21: qty 2

## 2021-01-21 MED ORDER — PANTOPRAZOLE SODIUM 40 MG PO TBEC
40.0000 mg | DELAYED_RELEASE_TABLET | Freq: Every day | ORAL | Status: DC
  Administered 2021-01-21 – 2021-01-22 (×2): 40 mg via ORAL
  Filled 2021-01-21 (×2): qty 1

## 2021-01-21 NOTE — Progress Notes (Signed)
Spoke with patient about his diabetes. States that he takes a "sliding scale" insulin dosage at home per his PCP according to his blood sugar at meal times. This was verified by his wife on the phone. She states that he takes Novolog insulin, but not sure if it is 70/30 mix. States that he sees the PCP every 2 months to see if the dosage needs to be changed.   Will continue to follow while in the hospital.  Harvel Ricks RN BSN CDE Diabetes Coordinator Pager: [phone]  8am-5pm

## 2021-01-21 NOTE — Progress Notes (Signed)
TRH night shift.  The nursing staff reports that the patient is having persisting hiccups that is not letting him sleep.  Metoclopramide 10 mg IVPB and pantoprazole 40 mg IVP x1 dose ordered.  Tennis Must, MD.

## 2021-01-21 NOTE — Progress Notes (Signed)
[facility] PROGRESS NOTE   Vaibhav Fogleman TGY:[id number] DOB: 06-May-1941 DOA: 01/18/2021  PCP: Prince Solian, MD  Brief History/Interval Summary: [name] is a 80 y.o. male with medical history significant of CVA; HTN; HLD: DM; CKD; CAD (not a candidate for CABG) chronic systolic CHF; and afib presenting with  SOB.  He can't remember things, staggering, losing weight unintentionally, no appetite.  +SOB, does not wear home O2.  He had 2 episodes of CP this week - sharp pain with associated SOB, unable to breathe in or out.    Went to the ED for these symptoms.  Consultants: Cardiology.  Neurosurgery.  Procedures: None yet  Antibiotics: Anti-infectives (From admission, onward)   None      Subjective/Interval History: Patient remains pleasantly confused.  His main concern today is his persistent hiccups.  Complains of some shortness of breath with exertion but denies any chest pain nausea or vomiting.  No headaches    Assessment/Plan:  NSTEMI/chronic systolic CHF Patient noted to have elevated troponin.  EKG was noted to be abnormal.  Cardiology was consulted.  It does not appear that any testing is planned.  Does not appear to have any symptoms at this time.  His dyspnea appears to be chronic.  From a CHF standpoint he was noted to be euvolemic.  Continued on his usual torsemide.  Echocardiogram showed EF to be less than 20%   Subdural hematoma Seen by neurosurgery.  Patient appears to be stable from a neurological0 standpoint.  No surgery is being planned.  Conservative management.  Antiplatelet agents on hold.  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  He is disoriented currently.  No focal deficits noted.  Hiccups Patient was given metoclopramide and Protonix overnight with improvement but he has had recurrence of symptoms.  Will order Reglan as needed.  Continue PPI.  Failure to  thrive Patient has been experiencing confusion weakness and weight loss.  Likely due to his multiple chronic comorbidities.  History of hypotension Midodrine being continued.  Diabetes mellitus type 2 HbA1c 1.9.  Monitor CBGs.  Apparently on 70/30 insulin at home but has not been taking it regularly.  CBGs noted to be between 1 60-200.  History of atrial fibrillation Not on rate control medications.  No anticoagulation.  Acute on chronic kidney disease stage IV/hyperkalemia Followed by [name] in the outpatient setting.  Baseline creatinine appears to be around 2.5.  Creatinine has been somewhat on the higher side.  Noted to be 3.15 today.  BUN is also climbed to 80.  This is all likely cardiorenal.  Patient not a candidate for hemodialysis considering his other significant comorbidities.  Potassium level improved after he was given Lokelma.   Tobacco dependence Reports that he is down to 3 cigarettes/day.  Declined patch.  Goals of care Waiting on palliative care input.   DVT Prophylaxis: SCDs Code Status: Full code Family Communication: No family at bedside Disposition Plan:   Unclear for now  Status is: Inpatient  Remains inpatient appropriate because:Altered mental status, Ongoing diagnostic testing needed not appropriate for outpatient work up and Unsafe d/c plan   Dispo: The patient is from: Home              Anticipated d/c is to: To be determined              Patient currently is not medically stable to d/c.   Difficult to place patient No  Medications:  Scheduled: . atorvastatin  40 mg Oral Daily  . feeding supplement  237 mL Oral TID BM  . insulin aspart  0-5 Units Subcutaneous QHS  . insulin aspart  0-9 Units Subcutaneous TID WC  . midodrine  5 mg Oral TID WC  . multivitamin with minerals  1 tablet Oral Daily  . nicotine  14 mg Transdermal Daily  . sodium chloride flush  3 mL Intravenous Q12H  . torsemide  20 mg Oral Daily   Continuous: . sodium  chloride     CBJ:SEGBTD chloride, acetaminophen, nitroGLYCERIN, ondansetron (ZOFRAN) IV, sodium chloride flush   Objective:  Vital Signs  Vitals:   01/20/21 2344 01/21/21 0358 01/21/21 0637 01/21/21 0954  BP: 134/79 (!) 136/106 132/85 (!) 146/91  Pulse: 89 89 84   Resp: 18 18  16   Temp: 98.3 F (36.8 C) 97.8 F (36.6 C)    TempSrc: Oral Oral    SpO2: 94% 99%    Weight:  65.2 kg    Height:        Intake/Output Summary (Last 24 hours) at 01/21/2021 1001 Last data filed at 01/21/2021 0900 Gross per 24 hour  Intake 937 ml  Output 250 ml  Net 687 ml   Filed Weights   01/20/21 0054 01/21/21 0358  Weight: 64.7 kg 65.2 kg    General appearance: Awake alert.  In no distress Resp: Normal effort at rest.  Diminished air entry at the bases.  No crackles or wheezing appreciated. Cardio: S1-S2 is normal regular.  No S3-S4.  No rubs murmurs or bruit GI: Abdomen is soft.  Nontender nondistended.  Bowel sounds are present normal.  No masses organomegaly Extremities: No edema.   Neurologic:   No focal neurological deficits.     Lab Results:  Data Reviewed: I have personally reviewed following labs and imaging studies  CBC: Recent Labs  Lab 01/18/21 2241 01/20/21 0332 01/21/21 0448  WBC 7.1 7.1 7.0  NEUTROABS 4.8  --   --   HGB 13.1 13.5 12.6*  HCT 41.9 42.8 38.6*  MCV 95.9 94.9 91.9  PLT 201 191 176    Basic Metabolic Panel: Recent Labs  Lab 01/18/21 2241 01/20/21 0332 01/21/21 0448  NA 140 137 137  K 4.9 5.3* 4.7  CL 102 102 99  CO2 26 21* 23  GLUCOSE 220* 79 207*  BUN 49* 65* 80*  CREATININE 2.57* 3.06* 3.15*  CALCIUM 9.5 9.0 9.1    GFR: Estimated Creatinine Clearance: 18.1 mL/min (A) (by C-G formula based on SCr of 3.15 mg/dL (H)).  Liver Function Tests: Recent Labs  Lab 01/18/21 2241  AST 49*  ALT 36  ALKPHOS 76  BILITOT 0.9  PROT 6.8  ALBUMIN 3.5    Coagulation Profile: Recent Labs  Lab 01/20/21 0332  INR 1.2     HbA1C: Recent  Labs    01/19/21 0950  HGBA1C 7.9*    CBG: Recent Labs  Lab 01/20/21 0559 01/20/21 1119 01/20/21 1625 01/20/21 2119 01/21/21 0538  GLUCAP 93 220* 166* 160* 183*    Lipid Profile: Recent Labs    01/19/21 0951  CHOL 144  HDL 43  LDLCALC 81  TRIG 98  CHOLHDL 3.3    Thyroid Function Tests: Recent Labs    01/19/21 0950  TSH 1.962      Recent Results (from the past 240 hour(s))  Resp Panel by RT-PCR (Flu A&B, Covid) Nasopharyngeal Swab     Status: None   Collection Time: 01/20/21  7:11 AM   Specimen: Nasopharyngeal Swab; Nasopharyngeal(NP) swabs in vial transport medium  Result Value Ref Range Status   SARS Coronavirus 2 by RT PCR NEGATIVE NEGATIVE Final    Comment: (NOTE) SARS-CoV-2 target nucleic acids are NOT DETECTED.  The SARS-CoV-2 RNA is generally detectable in upper respiratory specimens during the acute phase of infection. The lowest concentration of SARS-CoV-2 viral copies this assay can detect is 138 copies/mL. A negative result does not preclude SARS-Cov-2 infection and should not be used as the sole basis for treatment or other patient management decisions. A negative result may occur with  improper specimen collection/handling, submission of specimen other than nasopharyngeal swab, presence of viral mutation(s) within the areas targeted by this assay, and inadequate number of viral copies(<138 copies/mL). A negative result must be combined with clinical observations, patient history, and epidemiological information. The expected result is Negative.  Fact Sheet for Patients:  EntrepreneurPulse.com.au  Fact Sheet for Healthcare Providers:  IncredibleEmployment.be  This test is no t yet approved or cleared by the [place] FDA and  has been authorized for detection and/or diagnosis of SARS-CoV-2 by FDA under an Emergency Use Authorization (EUA). This EUA will remain  in effect (meaning this test can be  used) for the duration of the COVID-19 declaration under Section 564(b)(1) of the Act, 21 U.S.C.section 360bbb-3(b)(1), unless the authorization is terminated  or revoked sooner.       Influenza A by PCR NEGATIVE NEGATIVE Final   Influenza B by PCR NEGATIVE NEGATIVE Final    Comment: (NOTE) The Xpert Xpress SARS-CoV-2/FLU/RSV plus assay is intended as an aid in the diagnosis of influenza from Nasopharyngeal swab specimens and should not be used as a sole basis for treatment. Nasal washings and aspirates are unacceptable for Xpert Xpress SARS-CoV-2/FLU/RSV testing.  Fact Sheet for Patients: EntrepreneurPulse.com.au  Fact Sheet for Healthcare Providers: IncredibleEmployment.be  This test is not yet approved or cleared by the [place] FDA and has been authorized for detection and/or diagnosis of SARS-CoV-2 by FDA under an Emergency Use Authorization (EUA). This EUA will remain in effect (meaning this test can be used) for the duration of the COVID-19 declaration under Section 564(b)(1) of the Act, 21 U.S.C. section 360bbb-3(b)(1), unless the authorization is terminated or revoked.  Performed at [facility], [place] [address], [place], [place] [address]       Radiology Studies: CT HEAD WO CONTRAST  Result Date: 01/19/2021 CLINICAL DATA:  Ataxia.  Recent CVA. EXAM: CT HEAD WITHOUT CONTRAST TECHNIQUE: Contiguous axial images were obtained from the base of the skull through the vertex without intravenous contrast. COMPARISON:  CT head without contrast 02/28/2016. FINDINGS: Brain: Bilateral intermediate density the extra-axial fluid collections are present over the convexities. Left-sided collection measures up to 12 mm on coronal images. Right-sided collection measures 7 mm maximally on coronal images. There is some mass effect over the left parietal lobe with partial effacement sulci. No midline shift present. Moderate atrophy white matter  disease is otherwise stable. Remote lacunar infarct noted in the right thalamus. White matter changes extend into the brainstem. Cerebellum is unremarkable. Vascular: Atherosclerotic calcifications present the cavernous internal carotid arteries. No hyperdense vessel is present. Skull: Calvarium is intact. No focal lytic or blastic lesions are present. No significant extracranial soft tissue lesion is present. Sinuses/Orbits: The paranasal sinuses and mastoid air cells are clear. Bilateral lens replacements are noted. Globes and orbits are otherwise unremarkable. IMPRESSION: 1. 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 2. Mass effect over the left parietal lobe with partial effacement of sulci. No midline shift. 3. Stable atrophy and white matter disease. 4. Remote lacunar infarct of the right thalamus. Critical Value/emergent results were called by telephone at the time of interpretation on 01/19/2021 at 11:40 am to provider [facility] , who verbally acknowledged these results. Electronically Signed   By: San Morelle M.D.   On: 01/19/2021 11:40   ECHOCARDIOGRAM COMPLETE  Result Date: 01/19/2021    ECHOCARDIOGRAM REPORT   Patient Name:   SAFAL HALDERMAN Date of Exam: 01/19/2021 Medical Rec #:  [id number]    Height:       69.0 in Accession #:    [id number]   Weight:       144.2 lb Date of Birth:  [birth year]   BSA:          1.798 m Patient Age:    18 years     BP:           124/85 mmHg Patient Gender: M            HR:           76 bpm. Exam Location:  Inpatient Procedure: 2D Echo, Cardiac Doppler and Color Doppler Indications:    I50.23 Acute on chronic systolic (congestive) heart failure  History:        Patient has prior history of Echocardiogram examinations, most                 recent 04/12/2020. CHF, Stroke, Arrythmias:Atrial Fibrillation                 and LBBB; Risk Factors:Hypertension,  Diabetes and Dyslipidemia.  Sonographer:    Jonelle Sidle Dance Referring Phys: [id number] JENNIFER YATES  Sonographer Comments: Image acquisition challenging due to respiratory motion. IMPRESSIONS  1. Left ventricular ejection fraction, by estimation, is <20%. The left ventricle has severely decreased function. The left ventricle demonstrates global hypokinesis. The left ventricular internal cavity size was mildly dilated. There is mild left ventricular hypertrophy. Left ventricular diastolic parameters are indeterminate.  2. Right ventricular systolic function is moderately reduced. The right ventricular size is normal. There is moderately elevated pulmonary artery systolic pressure. The estimated right ventricular systolic pressure is 19.6 mmHg.  3. Left atrial size was severely dilated.  4. The mitral valve is normal in structure. Mild to moderate mitral valve regurgitation. No evidence of mitral stenosis.  5. Tricuspid valve regurgitation is mild to moderate.  6. The aortic valve is tricuspid. Aortic valve regurgitation is not visualized. Mild to moderate aortic valve sclerosis/calcification is present, without any evidence of aortic stenosis.  7. The inferior vena cava is dilated in size with <50% respiratory variability, suggesting right atrial pressure of 15 mmHg. FINDINGS  Left Ventricle: Left ventricular ejection fraction, by estimation, is <20%. The left ventricle has severely decreased function. The left ventricle demonstrates global hypokinesis. The left ventricular internal cavity size was mildly dilated. There is mild left ventricular hypertrophy. Left ventricular diastolic parameters are indeterminate. Right Ventricle: The right ventricular size is normal. No increase in right ventricular wall thickness. Right ventricular systolic function is moderately reduced. There is moderately elevated pulmonary artery systolic pressure. The tricuspid regurgitant velocity is 3.23 m/s, and with an assumed right atrial  pressure of 15 mmHg, the estimated right ventricular systolic pressure is 22.2 mmHg. Left Atrium: Left atrial size was severely dilated. Right Atrium: Right atrial size was normal in  size. Pericardium: There is no evidence of pericardial effusion. Mitral Valve: The mitral valve is normal in structure. Mild to moderate mitral valve regurgitation. No evidence of mitral valve stenosis. Tricuspid Valve: The tricuspid valve is normal in structure. Tricuspid valve regurgitation is mild to moderate. Aortic Valve: The aortic valve is tricuspid. Aortic valve regurgitation is not visualized. Mild to moderate aortic valve sclerosis/calcification is present, without any evidence of aortic stenosis. Pulmonic Valve: The pulmonic valve was not well visualized. Pulmonic valve regurgitation is not visualized. Aorta: The aortic root and ascending aorta are structurally normal, with no evidence of dilitation. Venous: The inferior vena cava is dilated in size with less than 50% respiratory variability, suggesting right atrial pressure of 15 mmHg. IAS/Shunts: The interatrial septum was not well visualized.  LEFT VENTRICLE PLAX 2D LVIDd:         5.60 cm  Diastology LVIDs:         4.80 cm  LV e' medial:    2.39 cm/s LV PW:         1.20 cm  LV E/e' medial:  36.6 LV IVS:        0.90 cm  LV e' lateral:   5.84 cm/s LVOT diam:     2.10 cm  LV E/e' lateral: 15.0 LV SV:         23 LV SV Index:   13 LVOT Area:     3.46 cm  RIGHT VENTRICLE            IVC RV Basal diam:  3.20 cm    IVC diam: 2.10 cm RV Mid diam:    2.30 cm RV S prime:     4.62 cm/s TAPSE (M-mode): 1.0 cm LEFT ATRIUM              Index       RIGHT ATRIUM           Index LA diam:        5.00 cm  2.78 cm/m  RA Area:     14.30 cm LA Vol (A2C):   147.0 ml 81.76 ml/m RA Volume:   41.10 ml  22.86 ml/m LA Vol (A4C):   70.9 ml  39.44 ml/m LA Biplane Vol: 110.0 ml 61.18 ml/m  AORTIC VALVE LVOT Vmax:   39.50 cm/s LVOT Vmean:  26.700 cm/s LVOT VTI:    0.067 m  AORTA Ao Root diam: 3.10  cm Ao Asc diam:  3.20 cm MITRAL VALVE                 TRICUSPID VALVE MV Area (PHT): 3.21 cm      TR Peak grad:   41.7 mmHg MV Decel Time: 236 msec      TR Vmax:        323.00 cm/s MR Peak grad:    78.9 mmHg MR Mean grad:    52.0 mmHg   SHUNTS MR Vmax:         444.00 cm/s Systemic VTI:  0.07 m MR Vmean:        339.0 cm/s  Systemic Diam: 2.10 cm MR PISA:         0.57 cm MR PISA Eff ROA: 5 mm MR PISA Radius:  0.30 cm MV E velocity: 87.40 cm/s Oswaldo Milian MD Electronically signed by Oswaldo Milian MD Signature Date/Time: 01/19/2021/7:55:38 PM    Final        LOS: 2 days   [place] [facility] Pager on [email or web address]  01/21/2021, 10:01 AM

## 2021-01-21 NOTE — Progress Notes (Signed)
Reglan and Protonix effective for hiccups. Hiccups resolved. Patient is asleep.

## 2021-01-21 NOTE — TOC Progression Note (Addendum)
Transition of Care (TOC) - Progression Note  Heart Failure   Patient Details  Name: [name] MRN: [id number] Date of Birth: [birth year]  Transition of Care [facility]) CM/SW [place], [place] Phone Number: 01/21/2021, 10:26 AM  Clinical Narrative:    CSW was able to get in touch with the patient's wife, Mrs. Arend Bahl [phone]) who denied having any concerns with transportation, housing or food but that the patients insulin is roughly $200/$300 a month and that is with the patients' health insurance, [employer]. Mrs. Sawin reported this to be a big expense and is wondering if there is any assistance available. CSW reached out to Cardiologist and rounding pharmacist to ask about patient assistance for the insulin. Pharmacist reported that the best option would include calling his primary care doctor to discuss affordability concerns regarding his insulin and to ask about changing to a different type of insulin that would be more cost effective as the provider would need to be the one to submit for the patient assistance.  CSW called the patients spouse, Mrs. Alois Colgan and informed her of this and she reported understanding.  TOC will continue to follow for d/c needs.      Barriers to Discharge: Continued Medical Work up  Expected Discharge Plan and Services   In-house Referral: Clinical Social Work     Living arrangements for the past 2 months: Single Family Home                                       Social Determinants of Health (SDOH) Interventions Food Insecurity Interventions: Intervention Not Indicated Financial Strain Interventions: Intervention Not Indicated Housing Interventions: Intervention Not Indicated Transportation Interventions: Intervention Not Indicated  Readmission Risk Interventions No flowsheet data found.  Raynald Rouillard, MSW, [place] Heart Failure Social Worker

## 2021-01-21 NOTE — Evaluation (Signed)
Occupational Therapy Evaluation Patient Details Name: [name] MRN: [id number] DOB: 01-10-1941 Today's Date: 01/21/2021    History of Present Illness 80 y.o. male presents to [name] ED on 01/18/2021 with SOB, impaired memory, gait instability, unintentional weight loss, and recent episodes of chest pain. Pt admitted with NSTEMI, CHF, FTT. Head CT revealed bilateral SDH. PMH includes CVA; HTN; HLD: DM; CKD; CAD (not a candidate for CABG) chronic systolic CHF; and afib.   Clinical Impression   Patient admitted for the diagnosis above.  PTA he lives with his spouse who is able to provide assist as needed.  He was completing his own ADL, and walked with a SPC.  Barriers are listed below.  Currently he needs up to [facility] for in room mobility and ADL in stand due to occasional unsteadiness.  He plans on returning home, and is considering outpatient Pt to regain strength.  No further OT needs in the acute setting.  Encouraged him to walk 3+ times each day.      Follow Up Recommendations  No OT follow up    Equipment Recommendations  None recommended by OT    Recommendations for Other Services       Precautions / Restrictions Precautions Precautions: Fall Restrictions Weight Bearing Restrictions: No      Mobility Bed Mobility               General bed mobility comments: up in recliner Patient Response: Cooperative  Transfers Overall transfer level: Needs assistance Equipment used: Rolling walker (2 wheeled);Straight cane Transfers: Sit to/from Stand Sit to Stand: Min guard              Balance Overall balance assessment: Needs assistance         Standing balance support: Single extremity supported Standing balance-Leahy Scale: Poor                             ADL either performed or assessed with clinical judgement   ADL Overall ADL's : At baseline                                       General ADL Comments: supervision and  setup as needed for bathing and dressing.  Up to [facility] for in room mobility.     Vision Baseline Vision/History: No visual deficits Patient Visual Report: No change from baseline       Perception     Praxis      Pertinent Vitals/Pain Pain Assessment: No/denies pain     Hand Dominance Right   Extremity/Trunk Assessment Upper Extremity Assessment Upper Extremity Assessment: Overall WFL for tasks assessed   Lower Extremity Assessment Lower Extremity Assessment: Defer to PT evaluation   Cervical / Trunk Assessment Cervical / Trunk Assessment: Normal   Communication Communication Communication: No difficulties   Cognition Arousal/Alertness: Awake/alert Behavior During Therapy: WFL for tasks assessed/performed Overall Cognitive Status: Within Functional Limits for tasks assessed                                 General Comments: ST Memort deficts noted - patient defers to spouse on questions.   General Comments       Exercises     Shoulder Instructions      Home Living Family/patient expects to be  discharged to:: Private residence Living Arrangements: Spouse/significant other Available Help at Discharge: Family;Available 24 hours/day Type of Home: House Home Access: Stairs to enter [employer] of Steps: 2 Entrance Stairs-Rails: Right Home Layout: Two level;Able to live on main level with bedroom/bathroom     Bathroom Shower/Tub: Tub/shower unit   Bathroom Toilet: Handicapped height     Home Equipment: Environmental consultant - 2 wheels;Cane - single point          Prior Functioning/Environment Level of Independence: Independent with assistive device(s)        Comments: has a remodeled walk in tub - seat, grab bars and LH shower.        OT Problem List: Decreased activity tolerance;Impaired balance (sitting and/or standing)      OT Treatment/Interventions:      OT Goals(Current goals can be found in the care plan section) Acute  Rehab OT Goals Patient Stated Goal: Return home OT Goal Formulation: With patient Time For Goal Achievement: 01/21/21 Potential to Achieve Goals: Good  OT Frequency:     Barriers to D/C:            Co-evaluation              AM-PAC OT "6 Clicks" Daily Activity     Outcome Measure Help from another person eating meals?: None Help from another person taking care of personal grooming?: None Help from another person toileting, which includes using toliet, bedpan, or urinal?: None Help from another person bathing (including washing, rinsing, drying)?: None Help from another person to put on and taking off regular upper body clothing?: None Help from another person to put on and taking off regular lower body clothing?: None 6 Click Score: 24   End of Session Equipment Utilized During Treatment: Gait belt;Other (comment)  Activity Tolerance: Patient tolerated treatment well Patient left: in chair;with call bell/phone within reach;with family/visitor present  OT Visit Diagnosis: Unsteadiness on feet (R26.81)                Time: 9211-9417 OT Time Calculation (min): 17 min Charges:  OT General Charges $OT Visit: 1 Visit OT Evaluation $OT Eval Moderate Complexity: 1 Mod  01/21/2021  Rich, OTR/L  Acute Rehabilitation Services  Office:  [phone]   Metta Clines 01/21/2021, 10:49 AM

## 2021-01-21 NOTE — Progress Notes (Signed)
Patient c/o of persistent hiccups that will not allow him to sleep. Dr. Olevia Bowens notified via [facility] communication. New orders received.

## 2021-01-22 DIAGNOSIS — S065X9A Traumatic subdural hemorrhage with loss of consciousness of unspecified duration, initial encounter: Secondary | ICD-10-CM | POA: Diagnosis not present

## 2021-01-22 LAB — BASIC METABOLIC PANEL
Anion gap: 12 (ref 5–15)
BUN: 83 mg/dL — ABNORMAL HIGH (ref 8–23)
CO2: 26 mmol/L (ref 22–32)
Calcium: 8.9 mg/dL (ref 8.9–10.3)
Chloride: 99 mmol/L (ref 98–111)
Creatinine, Ser: 3.04 mg/dL — ABNORMAL HIGH (ref 0.61–1.24)
GFR, Estimated: 20 mL/min — ABNORMAL LOW (ref >60)
Glucose, Bld: 93 mg/dL (ref 70–99)
Potassium: 5.1 mmol/L (ref 3.5–5.1)
Sodium: 137 mmol/L (ref 135–145)

## 2021-01-22 LAB — GLUCOSE, CAPILLARY
Glucose-Capillary: 115 mg/dL — ABNORMAL HIGH (ref 70–99)
Glucose-Capillary: 178 mg/dL — ABNORMAL HIGH (ref 70–99)

## 2021-01-22 MED ORDER — PANTOPRAZOLE SODIUM 40 MG PO TBEC: 40.0000 mg | DELAYED_RELEASE_TABLET | Freq: Every day | ORAL | 0 refills | Status: DC

## 2021-01-22 MED ORDER — METOCLOPRAMIDE HCL 5 MG PO TABS: 10.0000 mg | ORAL_TABLET | Freq: Three times a day (TID) | ORAL | 0 refills | Status: DC | PRN

## 2021-01-22 NOTE — Progress Notes (Addendum)
Pt has been D/C today. Received referral to assist with HH PT and RN. Will call HH agencies to see who can accept pt's insurance. Contacted Corey with [facility] and he accepted the referral. Met with pt and wife. Informed them that [facility] accepted the referral and wife is agreeable. Pt has a cane and a RW. She denies any D/C needs. 

## 2021-01-22 NOTE — Progress Notes (Signed)
Daily Progress Note   Patient Name: [name]       Date: 01/22/2021 DOB: Sep 08, 1941  Age: 80 y.o. MRN#: [id number] Attending Physician: Bonnielee Haff, MD Primary Care Physician: Prince Solian, MD Admit Date: 01/18/2021  Reason for Consultation/Follow-up: Establishing goals of care  Subjective: Patient is sitting on the side of the bed, attempting to eat dinner. He endorses feeling tired and weak. He has to frequently stop while trying to eat, and lays back down in the bed for the few minutes.   Wife [name] is at bedside. Discussed the issue of acute on chronic kidney disease. I express concern that his creatinine has increased again today to 3.15. Discussed that if creatinine continues to increase, patient would not be a good candidate for dialysis due to advanced heart failure and other co-morbidities. [name] verbalizes understanding.   [name] and I completed a MOST form together. The patient and family outlined their wishes for the following treatment decisions:  Cardiopulmonary Resuscitation: Attempt Resuscitation (CPR)  Medical Interventions: Full Scope of Treatment: Use intubation, advanced airway interventions, mechanical ventilation, cardioversion as indicated, medical treatment, IV fluids, etc, also provide comfort measures. Transfer to the hospital if indicated  Antibiotics: Antibiotics if indicated  IV Fluids: IV fluids if indicated  Feeding Tube: Feeding tube for a defined trial period   Provided education on evidenced based poor outcomes in similar hospitalized patients, as the cause of the arrest is likely associated with chronic/terminal disease rather than a reversible acute cardio-pulmonary event.  Patient/family understand  prognosis would be poor in the event of a  resuscitation event, but wish to continue full code status.  Patient does state he would not want long-term mechanical ventilation or tracheostomy.       Length of Stay: 3  Current Medications: Scheduled Meds:  . atorvastatin  40 mg Oral Daily  . feeding supplement  237 mL Oral TID BM  . insulin aspart  0-5 Units Subcutaneous QHS  . insulin aspart  0-9 Units Subcutaneous TID WC  . midodrine  5 mg Oral TID WC  . multivitamin with minerals  1 tablet Oral Daily  . nicotine  14 mg Transdermal Daily  . pantoprazole  40 mg Oral Q1200  . sodium chloride flush  3 mL Intravenous Q12H  . torsemide  20 mg Oral Daily  PRN Meds: sodium chloride, acetaminophen, metoCLOPramide, nitroGLYCERIN, ondansetron (ZOFRAN) IV, sodium chloride flush  Physical Exam Vitals reviewed.  Constitutional:      General: He is not in acute distress.    Appearance: He is ill-appearing.  Pulmonary:     Effort: Pulmonary effort is normal.  Neurological:     Mental Status: He is alert.     Motor: Weakness present.             Vital Signs: BP 130/77 (BP Location: Right Arm)   Pulse 95   Temp 97.9 F (36.6 C) (Oral)   Resp 16   Ht 5\' 9"  (1.753 m)   Wt 66 kg   SpO2 98%   BMI 21.49 kg/m  SpO2: SpO2: 98 % O2 Device: O2 Device: Room Air O2 Flow Rate:    Intake/output summary:   Intake/Output Summary (Last 24 hours) at 01/22/2021 1340 Last data filed at 01/22/2021 1300 Gross per 24 hour  Intake 697 ml  Output 750 ml  Net -53 ml   LBM: Last BM Date: 01/21/21 Baseline Weight: Weight: 64.7 kg (scale C) Most recent weight: Weight: 66 kg       Palliative Assessment/Data: PPS 40%       Palliative Care Assessment & Plan    HPI/Patient Profile: 79 y.o. male  with past medical history of chronic systolic CHF, CAD (not a candidate for CABG), atrial fibrillation, CVA, CKD stage 4, HTN, HLD, and type 2 diabetes. He presented to the emergency department on 01/18/2021 with chest pain.  ED Course:  Troponin 1187-> 1169, BNP 3838, creatinine 2.57. Chest x-ray showed small left pleural effusion with left mid to lower lung field atelectasis versus developing infiltrate.  Admitted to [facility] with NSTEMI, acute on chronic combined CHF, and failure to thrive.   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 - Acute on chronic CKD stage IV - hiccups - failure to thrive  Recommendations/Plan:  Full code full scope  MOST form completed - copy will be scanned into Vynca and original placed on shadow chart  Patient/family would not want long-term mechanical ventilation or tracheostomy  Patient/family understand he would not be a good candidate for dialysis  Goal of care is to return home   Goals of Care and Additional Recommendations:  Limitations on Scope of Treatment: Full Scope Treatment and No Tracheostomy  Code Status:  Full  Prognosis:   Unable to determine  Discharge Planning:  Home with [place] was discussed with bedside RN  Thank you for allowing the Palliative Medicine Team to assist in the care of this patient.   Total Time 35 minutes Prolonged Time Billed  no       Greater than 50%  of this time was spent counseling and coordinating care related to the above assessment and plan.  Lavena Bullion, NP  Please contact Palliative Medicine Team phone at [phone] for questions and concerns.

## 2021-01-22 NOTE — Plan of Care (Signed)

## 2021-01-22 NOTE — Discharge Summary (Addendum)
[facility]  Physician Discharge Summary   Patient ID: [name] MRN: [id number] DOB/AGE: 78-Dec-1942 78 y.o.  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 Outpatient follow-up with neurosurgery. 2. To hold antiplatelet agents till follow-up with neurosurgery next week.  Home Health: RN and PT Equipment/Devices: None  CODE STATUS: Full code  DISCHARGE CONDITION: fair  Diet recommendation: As before  INITIAL HISTORY: Peter Rogersis a 80 y.o.malewith medical history significant ofCVA; HTN; HLD: DM; CKD; CAD (not a candidate for CABG) chronic systolic CHF;and afib presenting with SOB. He can't remember things, staggering, losing weight unintentionally, no appetite. +SOB, does not wear home O2. He had 2 episodes of CP this week - sharp pain with associated SOB, unable to breathe in or out.   Went to the ED for these symptoms.  Consultants: Cardiology.  Neurosurgery.  Procedures: None    HOSPITAL COURSE:   NSTEMI/chronic systolic CHF Patient noted to have elevated troponin.  EKG was noted to be abnormal.  Cardiology was consulted.  Patient was not experiencing any chest pain.  Dyspnea was chronic.  No ischemic work-up was planned by cardiology.  From a CHF standpoint he was noted to be euvolemic.  He was continued on his torsemide.Echocardiogram showed EF to be less than 20%   Subdural hematoma Seen by neurosurgery.  Patient appears to be stable from a neurological standpoint.  No surgery is being planned.    Discussed with neurosurgery today.  They recommend continue to hold antiplatelets till follow-up next week in office.  History of stroke/possible vascular  dementia Patient has been having memory issues according to family reports.    Hiccups Patient was given metoclopramide and Protonix overnight with improvement.  Failure to thrive Patient has been experiencing confusion weakness and weight loss.  Likely due to his multiple chronic comorbidities.  History of hypotension Midodrine being continued.  Diabetes mellitus type 2 HbA1c 1.9.    Continue home medications  Questionable historyof atrial fibrillation Per cardiology note there is no documented history of same.  Acute on chronic kidney disease stage IV/hyperkalemia Followed by [name] in the outpatient setting.  Baseline creatinine appears to be around 2.5.  Creatinine has been somewhat on the higher side here in the hospital.  Could be experiencing progression of his kidney disease.  Electrolytes were checked and his potassium level was noted to be elevated for which he was given [facility] with improvement.  He will need outpatient follow-up with his nephrologist.  Tobacco dependence Reports that he is down to 3 cigarettes/day.  Declined patch.  Goals of care Seen by palliative care.  Currently plan is to continue full scope of care.  Severe malnutrition Nutrition Problem: Severe Malnutrition Etiology: chronic illness (CVA)  Signs/Symptoms: percent weight loss,energy intake < or equal to 75% for > or equal to 1 month,moderate fat depletion,severe fat depletion,moderate muscle depletion,severe muscle depletion  Interventions: Ensure Enlive (each supplement provides 350kcal and 20 grams of protein),MVI  Patient has remained stable.  Discussed with his wife.  Patient seen by palliative care yesterday.  Labs are stable this morning.  Okay for discharge home today.  Home health has been ordered.   PERTINENT LABS:  The results of significant diagnostics from this hospitalization (including imaging, microbiology, ancillary and laboratory) are  listed below for reference.     Microbiology: Recent Results (from the past 240 hour(s))  Resp Panel by RT-PCR (Flu A&B, Covid) Nasopharyngeal Swab     Status: None   Collection Time: 01/20/21  7:11 AM   Specimen: Nasopharyngeal Swab; Nasopharyngeal(NP) swabs in vial transport medium  Result Value Ref Range Status   SARS Coronavirus 2 by RT PCR NEGATIVE NEGATIVE Final    Comment: (NOTE) SARS-CoV-2 target nucleic acids are NOT DETECTED.  The SARS-CoV-2 RNA is generally detectable in upper respiratory specimens during the acute phase of infection. The lowest concentration of SARS-CoV-2 viral copies this assay can detect is 138 copies/mL. A negative result does not preclude SARS-Cov-2 infection and should not be used as the sole basis for treatment or other patient management decisions. A negative result may occur with  improper specimen collection/handling, submission of specimen other than nasopharyngeal swab, presence of viral mutation(s) within the areas targeted by this assay, and inadequate number of viral copies(<138 copies/mL). A negative result must be combined with clinical observations, patient history, and epidemiological information. The expected result is Negative.  Fact Sheet for Patients:  EntrepreneurPulse.com.au  Fact Sheet for Healthcare Providers:  IncredibleEmployment.be  This test is no t yet approved or cleared by the [place] FDA and  has been authorized for detection and/or diagnosis of SARS-CoV-2 by FDA under an Emergency Use Authorization (EUA). This EUA will remain  in effect (meaning this test can be used) for the duration of the COVID-19 declaration under Section 564(b)(1) of the Act, 21 U.S.C.section 360bbb-3(b)(1), unless the authorization is terminated  or revoked sooner.       Influenza A by PCR NEGATIVE NEGATIVE Final   Influenza B by PCR NEGATIVE NEGATIVE Final    Comment: (NOTE) The Xpert Xpress SARS-CoV-2/FLU/RSV plus assay is  intended as an aid in the diagnosis of influenza from Nasopharyngeal swab specimens and should not be used as a sole basis for treatment. Nasal washings and aspirates are unacceptable for Xpert Xpress SARS-CoV-2/FLU/RSV testing.  Fact Sheet for Patients: EntrepreneurPulse.com.au  Fact Sheet for Healthcare Providers: IncredibleEmployment.be  This test is not yet approved or cleared by the [place] FDA and has been authorized for detection and/or diagnosis of SARS-CoV-2 by FDA under an Emergency Use Authorization (EUA). This EUA will remain in effect (meaning this test can be used) for the duration of the COVID-19 declaration under Section 564(b)(1) of the Act, 21 U.S.C. section 360bbb-3(b)(1), unless the authorization is terminated or revoked.  Performed at [facility], [place] [address], [place], [place] [address]      Labs:  COVID-19 Labs    Lab Results  Component Value Date   [place] 01/20/2021   [place] NEGATIVE 12/15/2020   [place] NEGATIVE 05/21/2019   [place] NEGATIVE 04/01/2019      Basic Metabolic Panel: Recent Labs  Lab 01/18/21 2241 01/20/21 0332 01/21/21 0448 01/22/21 0327  NA 140 137 137 137  K 4.9 5.3* 4.7 5.1  CL 102 102 99 99  CO2 26 21* 23 26  GLUCOSE 220* 79 207* 93  BUN 49* 65* 80* 83*  CREATININE 2.57* 3.06* 3.15* 3.04*  CALCIUM 9.5 9.0 9.1 8.9   Liver Function Tests: Recent Labs  Lab 01/18/21 2241  AST 49*  ALT 36  ALKPHOS 76  BILITOT 0.9  PROT 6.8  ALBUMIN 3.5   CBC: Recent Labs  Lab 01/18/21 2241 01/20/21 0332 01/21/21 0448  WBC 7.1 7.1 7.0  NEUTROABS 4.8  --   --  HGB 13.1 13.5 12.6*  HCT 41.9 42.8 38.6*  MCV 95.9 94.9 91.9  PLT 201 191 192   BNP: BNP (last 3 results) Recent Labs    11/03/20 1036 12/15/20 0212 01/19/21 0954  BNP 1,049.6* 2,613.3* 3,838.2*    CBG: Recent Labs  Lab 01/21/21 1109 01/21/21 1700 01/21/21 2059  01/22/21 0603 01/22/21 1119  GLUCAP 261* 251* 284* 115* 178*     IMAGING STUDIES CT ABDOMEN PELVIS WO CONTRAST  Result Date: 01/19/2021 CLINICAL DATA:  Shortness of breath. Unintentional weight loss. Tobacco use. EXAM: CT CHEST, ABDOMEN AND PELVIS WITHOUT CONTRAST TECHNIQUE: Multidetector CT imaging of the chest, abdomen and pelvis was performed following the standard protocol without IV contrast. COMPARISON:  CT chest 04/01/2019 FINDINGS: CT CHEST FINDINGS Cardiovascular: Cardiac enlargement. No pericardial effusions. Prominent coronary artery and minimal aortic calcification. Normal caliber thoracic aorta. Mediastinum/Nodes: Esophagus is decompressed. Moderately prominent mediastinal lymph nodes without pathologic enlargement, likely reactive. Similar appearance to previous study. Lungs/Pleura: Small bilateral pleural effusions. Basilar atelectasis. Mild subpleural fibrosis in the lungs. Mild interstitial pattern likely due to edema. Patchy focal airspace disease in the right upper lung may represent multifocal pneumonia or early edema. No pneumothorax. Musculoskeletal: Degenerative changes in the thoracic spine. Normal alignment. No vertebral compression deformities. Sternum and ribs are nondepressed. CT ABDOMEN PELVIS FINDINGS Hepatobiliary: No focal liver abnormality is seen. No gallstones, gallbladder wall thickening, or biliary dilatation. Pancreas: Unremarkable. No pancreatic ductal dilatation or surrounding inflammatory changes. Spleen: Normal in size without focal abnormality. Adrenals/Urinary Tract: Left adrenal gland nodule with fat attenuation consistent with benign adenoma. No change since prior study. Kidneys are symmetrical in size. No hydronephrosis or hydroureter. No renal or ureteral stones. Bladder is normal for degree of distention. Stomach/Bowel: Stomach is within normal limits. Appendix appears normal. No evidence of bowel wall thickening, distention, or inflammatory changes.  Vascular/Lymphatic: Aortic atherosclerosis. No enlarged abdominal or pelvic lymph nodes. Reproductive: Prostate gland is mildly enlarged. Penile prosthesis with retention reservoir or in the anterior pelvis. Other: No free air or free fluid in the abdomen. Lipoma in the subcutaneous fat superficial to the left flank musculature. Vascular calcifications in the groin regions. Surgical clips in the right groin. Musculoskeletal: No acute or significant osseous findings. IMPRESSION: 1. Cardiac enlargement with mild interstitial edema and small bilateral pleural effusions. 2. Patchy focal airspace disease in the right upper lung may represent multifocal pneumonia or early alveolar edema. 3. Mild subpleural fibrosis in the lungs. 4. Prominent mediastinal lymph nodes without pathologic enlargement, likely reactive. 5. Coronary artery and minimal aortic calcification. Aortic Atherosclerosis (ICD10-I70.0). Electronically Signed   By: Lucienne Capers M.D.   On: 01/19/2021 01:51   DG Chest 2 View  Result Date: 01/18/2021 CLINICAL DATA:  80 year old male with shortness of breath. EXAM: CHEST - 2 VIEW COMPARISON:  Chest radiograph dated 12/14/2020. FINDINGS: Faint left mid to lower lung field interstitial densities may represent atelectasis but concerning for developing infiltrate. There is a small left pleural effusion. No pneumothorax. Mild cardiomegaly. Atherosclerotic calcification of the aorta. No acute osseous pathology. Degenerative changes of the spine. IMPRESSION: Small left pleural effusion with left mid to lower lung field atelectasis versus developing infiltrate. Electronically Signed   By: Anner Crete M.D.   On: 01/18/2021 23:15   CT HEAD WO CONTRAST  Result Date: 01/19/2021 CLINICAL DATA:  Ataxia.  Recent CVA. EXAM: CT HEAD WITHOUT CONTRAST TECHNIQUE: Contiguous axial images were obtained from the base of the skull through the vertex without intravenous contrast. COMPARISON:  CT head without contrast  02/28/2016. FINDINGS: Brain: Bilateral intermediate density the extra-axial fluid collections are present over the convexities. Left-sided collection measures up to 12 mm on coronal images. Right-sided collection measures 7 mm maximally on coronal images. There is some mass effect over the left parietal lobe with partial effacement sulci. No midline shift present. Moderate atrophy white matter disease is otherwise stable. Remote lacunar infarct noted in the right thalamus. White matter changes extend into the brainstem. Cerebellum is unremarkable. Vascular: Atherosclerotic calcifications present the cavernous internal carotid arteries. No hyperdense vessel is present. Skull: Calvarium is intact. No focal lytic or blastic lesions are present. No significant extracranial soft tissue lesion is present. Sinuses/Orbits: The paranasal sinuses and mastoid air cells are clear. Bilateral lens replacements are noted. Globes and orbits are otherwise unremarkable. IMPRESSION: 1. Bilateral intermediate density extra-axial fluid collections over the convexities most compatible with subacute or early chronic subdural hematomas. 2. Mass effect over the left parietal lobe with partial effacement of sulci. No midline shift. 3. Stable atrophy and white matter disease. 4. Remote lacunar infarct of the right thalamus. Critical Value/emergent results were called by telephone at the time of interpretation on 01/19/2021 at 11:40 am to provider [facility] , who verbally acknowledged these results. Electronically Signed   By: San Morelle M.D.   On: 01/19/2021 11:40   CT Chest Wo Contrast  Result Date: 01/19/2021 CLINICAL DATA:  Shortness of breath. Unintentional weight loss. Tobacco use. EXAM: CT CHEST, ABDOMEN AND PELVIS WITHOUT CONTRAST TECHNIQUE: Multidetector CT imaging of the chest, abdomen and pelvis was performed following the standard protocol without IV contrast. COMPARISON:  CT chest 04/01/2019 FINDINGS: CT CHEST  FINDINGS Cardiovascular: Cardiac enlargement. No pericardial effusions. Prominent coronary artery and minimal aortic calcification. Normal caliber thoracic aorta. Mediastinum/Nodes: Esophagus is decompressed. Moderately prominent mediastinal lymph nodes without pathologic enlargement, likely reactive. Similar appearance to previous study. Lungs/Pleura: Small bilateral pleural effusions. Basilar atelectasis. Mild subpleural fibrosis in the lungs. Mild interstitial pattern likely due to edema. Patchy focal airspace disease in the right upper lung may represent multifocal pneumonia or early edema. No pneumothorax. Musculoskeletal: Degenerative changes in the thoracic spine. Normal alignment. No vertebral compression deformities. Sternum and ribs are nondepressed. CT ABDOMEN PELVIS FINDINGS Hepatobiliary: No focal liver abnormality is seen. No gallstones, gallbladder wall thickening, or biliary dilatation. Pancreas: Unremarkable. No pancreatic ductal dilatation or surrounding inflammatory changes. Spleen: Normal in size without focal abnormality. Adrenals/Urinary Tract: Left adrenal gland nodule with fat attenuation consistent with benign adenoma. No change since prior study. Kidneys are symmetrical in size. No hydronephrosis or hydroureter. No renal or ureteral stones. Bladder is normal for degree of distention. Stomach/Bowel: Stomach is within normal limits. Appendix appears normal. No evidence of bowel wall thickening, distention, or inflammatory changes. Vascular/Lymphatic: Aortic atherosclerosis. No enlarged abdominal or pelvic lymph nodes. Reproductive: Prostate gland is mildly enlarged. Penile prosthesis with retention reservoir or in the anterior pelvis. Other: No free air or free fluid in the abdomen. Lipoma in the subcutaneous fat superficial to the left flank musculature. Vascular calcifications in the groin regions. Surgical clips in the right groin. Musculoskeletal: No acute or significant osseous findings.  IMPRESSION: 1. Cardiac enlargement with mild interstitial edema and small bilateral pleural effusions. 2. Patchy focal airspace disease in the right upper lung may represent multifocal pneumonia or early alveolar edema. 3. Mild subpleural fibrosis in the lungs. 4. Prominent mediastinal lymph nodes without pathologic enlargement, likely reactive. 5. Coronary artery and minimal aortic calcification. Aortic Atherosclerosis (ICD10-I70.0). Electronically  Signed   By: Lucienne Capers M.D.   On: 01/19/2021 01:51   ECHOCARDIOGRAM COMPLETE  Result Date: 01/19/2021    ECHOCARDIOGRAM REPORT   Patient Name:   ALANDIS BLUEMEL Date of Exam: 01/19/2021 Medical Rec #:  [id number]    Height:       69.0 in Accession #:    [id number]   Weight:       144.2 lb Date of Birth:  10/22/1940   BSA:          1.798 m Patient Age:    50 years     BP:           124/85 mmHg Patient Gender: M            HR:           76 bpm. Exam Location:  Inpatient Procedure: 2D Echo, Cardiac Doppler and Color Doppler Indications:    I50.23 Acute on chronic systolic (congestive) heart failure  History:        Patient has prior history of Echocardiogram examinations, most                 recent 04/12/2020. CHF, Stroke, Arrythmias:Atrial Fibrillation                 and LBBB; Risk Factors:Hypertension, Diabetes and Dyslipidemia.  Sonographer:    Jonelle Sidle Dance Referring Phys: [id number] JENNIFER YATES  Sonographer Comments: Image acquisition challenging due to respiratory motion. IMPRESSIONS  1. Left ventricular ejection fraction, by estimation, is <20%. The left ventricle has severely decreased function. The left ventricle demonstrates global hypokinesis. The left ventricular internal cavity size was mildly dilated. There is mild left ventricular hypertrophy. Left ventricular diastolic parameters are indeterminate.  2. Right ventricular systolic function is moderately reduced. The right ventricular size is normal. There is moderately elevated pulmonary artery systolic  pressure. The estimated right ventricular systolic pressure is 10.9 mmHg.  3. Left atrial size was severely dilated.  4. The mitral valve is normal in structure. Mild to moderate mitral valve regurgitation. No evidence of mitral stenosis.  5. Tricuspid valve regurgitation is mild to moderate.  6. The aortic valve is tricuspid. Aortic valve regurgitation is not visualized. Mild to moderate aortic valve sclerosis/calcification is present, without any evidence of aortic stenosis.  7. The inferior vena cava is dilated in size with <50% respiratory variability, suggesting right atrial pressure of 15 mmHg. FINDINGS  Left Ventricle: Left ventricular ejection fraction, by estimation, is <20%. The left ventricle has severely decreased function. The left ventricle demonstrates global hypokinesis. The left ventricular internal cavity size was mildly dilated. There is mild left ventricular hypertrophy. Left ventricular diastolic parameters are indeterminate. Right Ventricle: The right ventricular size is normal. No increase in right ventricular wall thickness. Right ventricular systolic function is moderately reduced. There is moderately elevated pulmonary artery systolic pressure. The tricuspid regurgitant velocity is 3.23 m/s, and with an assumed right atrial pressure of 15 mmHg, the estimated right ventricular systolic pressure is 32.3 mmHg. Left Atrium: Left atrial size was severely dilated. Right Atrium: Right atrial size was normal in size. Pericardium: There is no evidence of pericardial effusion. Mitral Valve: The mitral valve is normal in structure. Mild to moderate mitral valve regurgitation. No evidence of mitral valve stenosis. Tricuspid Valve: The tricuspid valve is normal in structure. Tricuspid valve regurgitation is mild to moderate. Aortic Valve: The aortic valve is tricuspid. Aortic valve regurgitation is not visualized. Mild to moderate aortic valve sclerosis/calcification is present, without any  evidence of  aortic stenosis. Pulmonic Valve: The pulmonic valve was not well visualized. Pulmonic valve regurgitation is not visualized. Aorta: The aortic root and ascending aorta are structurally normal, with no evidence of dilitation. Venous: The inferior vena cava is dilated in size with less than 50% respiratory variability, suggesting right atrial pressure of 15 mmHg. IAS/Shunts: The interatrial septum was not well visualized.  LEFT VENTRICLE PLAX 2D LVIDd:         5.60 cm  Diastology LVIDs:         4.80 cm  LV e' medial:    2.39 cm/s LV PW:         1.20 cm  LV E/e' medial:  36.6 LV IVS:        0.90 cm  LV e' lateral:   5.84 cm/s LVOT diam:     2.10 cm  LV E/e' lateral: 15.0 LV SV:         23 LV SV Index:   13 LVOT Area:     3.46 cm  RIGHT VENTRICLE            IVC RV Basal diam:  3.20 cm    IVC diam: 2.10 cm RV Mid diam:    2.30 cm RV S prime:     4.62 cm/s TAPSE (M-mode): 1.0 cm LEFT ATRIUM              Index       RIGHT ATRIUM           Index LA diam:        5.00 cm  2.78 cm/m  RA Area:     14.30 cm LA Vol (A2C):   147.0 ml 81.76 ml/m RA Volume:   41.10 ml  22.86 ml/m LA Vol (A4C):   70.9 ml  39.44 ml/m LA Biplane Vol: 110.0 ml 61.18 ml/m  AORTIC VALVE LVOT Vmax:   39.50 cm/s LVOT Vmean:  26.700 cm/s LVOT VTI:    0.067 m  AORTA Ao Root diam: 3.10 cm Ao Asc diam:  3.20 cm MITRAL VALVE                 TRICUSPID VALVE MV Area (PHT): 3.21 cm      TR Peak grad:   41.7 mmHg MV Decel Time: 236 msec      TR Vmax:        323.00 cm/s MR Peak grad:    78.9 mmHg MR Mean grad:    52.0 mmHg   SHUNTS MR Vmax:         444.00 cm/s Systemic VTI:  0.07 m MR Vmean:        339.0 cm/s  Systemic Diam: 2.10 cm MR PISA:         0.57 cm MR PISA Eff ROA: 5 mm MR PISA Radius:  0.30 cm MV E velocity: 87.40 cm/s Oswaldo Milian MD Electronically signed by Oswaldo Milian MD Signature Date/Time: 01/19/2021/7:55:38 PM    Final     DISCHARGE EXAMINATION: Vitals:   01/21/21 1227 01/21/21 2000 01/22/21 0340 01/22/21 1133  BP:   123/71 116/71 130/77  Pulse:  88 88 95  Resp:  19 19 16   Temp: 97.9 F (36.6 C) (!) 97.5 F (36.4 C) 98 F (36.7 C) 97.9 F (36.6 C)  TempSrc: Oral Oral Oral Oral  SpO2: 100% 100% 94% 98%  Weight:   66 kg   Height:       General appearance: Awake alert.  In no distress  Resp: Clear to auscultation bilaterally.  Normal effort Cardio: S1-S2 is normal regular.  No S3-S4.  No rubs murmurs or bruit GI: Abdomen is soft.  Nontender nondistended.  Bowel sounds are present normal.  No masses organomegaly    DISPOSITION: Home with wife  Discharge Instructions    (HEART FAILURE PATIENTS) Call MD:  Anytime you have any of the following symptoms: 1) 3 pound weight gain in 24 hours or 5 pounds in 1 week 2) shortness of breath, with or without a dry hacking cough 3) swelling in the hands, feet or stomach 4) if you have to sleep on extra pillows at night in order to breathe.   Complete by: As directed    Call MD for:  difficulty breathing, headache or visual disturbances   Complete by: As directed    Call MD for:  extreme fatigue   Complete by: As directed    Call MD for:  persistant dizziness or light-headedness   Complete by: As directed    Call MD for:  persistant nausea and vomiting   Complete by: As directed    Call MD for:  severe uncontrolled pain   Complete by: As directed    Call MD for:  temperature >100.4   Complete by: As directed    Discharge diet:   Complete by: As directed    Take a dysphagia 3 diet.  See information sheet   Discharge instructions   Complete by: As directed    Please be sure to call Dr. Hewitt Shorts office on Monday to arrange follow-up.  Please do not take your aspirin and Plavix till he has seen you in his office and has cleared you to resume these medications.  You were cared for by a hospitalist during your hospital stay. If you have any questions about your discharge medications or the care you received while you were in the hospital after you are  discharged, you can call the unit and asked to speak with the hospitalist on call if the hospitalist that took care of you is not available. Once you are discharged, your primary care physician will handle any further medical issues. Please note that NO REFILLS for any discharge medications will be authorized once you are discharged, as it is imperative that you return to your primary care physician (or establish a relationship with a primary care physician if you do not have one) for your aftercare needs so that they can reassess your need for medications and monitor your lab values. If you do not have a primary care physician, you can call [phone] for a physician referral.   Increase activity slowly   Complete by: As directed         Allergies as of 01/22/2021      Reactions   Bee Venom Itching, Swelling, Other (See Comments)   Dizziness        Medication List    STOP taking these medications   aspirin 81 MG EC tablet   clopidogrel 75 MG tablet Commonly known as: PLAVIX     TAKE these medications   insulin aspart protamine- aspart (70-30) 100 UNIT/ML injection Commonly known as: NOVOLOG MIX 70/30 Inject 8-16 Units into the skin 2 (two) times daily. Sliding scale   Insulin Pen Needle 32G X 4 MM Misc Use as directed daily at bedtime.   iron polysaccharides 150 MG capsule Commonly known as: Ferrex 150 Take 1 capsule (150 mg total) by mouth daily.   metoCLOPramide 5 MG tablet Commonly  known as: REGLAN Take 2 tablets (10 mg total) by mouth every 8 (eight) hours as needed (hiccups).   midodrine 5 MG tablet Commonly known as: PROAMATINE Take 1 tablet (5 mg total) by mouth 3 (three) times daily with meals.   multivitamin with minerals Tabs tablet Take 1 tablet by mouth daily.   pantoprazole 40 MG tablet Commonly known as: PROTONIX Take 1 tablet (40 mg total) by mouth daily at 12 noon.   simvastatin 40 MG tablet Commonly known as: ZOCOR Take 40 mg by mouth daily.    torsemide 20 MG tablet Commonly known as: DEMADEX Take 1 tablet (20 mg total) by mouth daily. What changed: when to take this         Follow-up Information    Avva, Ravisankar, MD. Schedule an appointment as soon as possible for a visit in 1 week(s).   Specialty: Internal Medicine Contact information: [place] [address] [phone]        Ashok Pall, MD Follow up.   Specialty: Neurosurgery Why: He wants to see you in his office in 1 week. Please call office on monday to schedule appoinment Contact information: [address] [place] [address] [phone]        Care, [facility] Follow up.   Specialty: Home Health Services Why: Someone from the home health agency will be in contact with you after discharge to arrange your first home health service.        Contact information: [place] [address] [phone]               TOTAL DISCHARGE TIME: 35 minutes  [facility] Pager on [email or web address]  01/23/2021, 11:24 AM

## 2021-01-22 NOTE — Discharge Instructions (Signed)
Dysphagia Eating Plan, Bite Size Food This eating plan is for people with moderate swallowing problems who have transitioned from pureed and minced foods. Bite size foods are soft and cut into small chunks so that they can be swallowed safely. On this eating plan, you may be instructed to drink liquids that are thickened. Work with your health care provider and your diet and nutrition specialist (dietitian) to make sure that you are following the eating plan safely and getting all the nutrients you need. What are tips for following this plan? General information  You may eat foods that are tender, soft, and moist.  Always test food texture before taking a bite. Poke food with a fork or spoon to make sure it is tender. The test sample should squash, break apart, or change shape, and it should not return to its original shape when the fork or spoon is removed.  Food should be easy to cut and chew. Avoid large pieces of food that require a lot of chewing.  Take small bites. Each bite should be smaller than your thumb nail (about 15 mm by 15 mm).  If you were on a pureed or minced food eating plan, you may eat any of the foods included in those diets.  Avoid foods that are very dry, hard, sticky, chewy, coarse, or crunchy.  If instructed by your health care provider, thicken liquids. Follow your health care provider's instructions for what products to use, how to do this, and to what thickness. ? Your health care provider may recommend using a commercial thickener, rice cereal, or potato flakes. Ask your health care provider to recommend thickeners. ? Thickened liquids are usually a "pudding-like" consistency, or they may be as thick as honey or thick enough to eat with a spoon.   Cooking  To moisten foods, you may add liquids while you are blending, mashing, or grinding your foods to the right consistency. These liquids include gravies, sauces, vegetable or fruit juice, milk, half and half, or  water.  Strain extra liquid from foods before eating.  Reheat foods slowly to prevent a tough crust from forming.  Prepare foods in advance. Meal planning  Eat a variety of foods to get all the nutrients you need.  Some foods may be tolerated better than others. Work with your dietitian to identify which foods are safest for you to eat.  Follow your meal plan as told by your dietitian. What foods can I eat? Grains Moist breads without nuts or seeds. Biscuits, muffins, pancakes, and waffles that are well-moistened with syrup, jelly, margarine, or butter. Cooked cereals. Moist bread stuffing. Moist rice. Well-moistened cold cereal with small chunks. Well-cooked pasta, noodles, rice, and bread dressing in small pieces and thick sauce. Soft dumplings or spaetzle in small pieces and butter or gravy. Fruits Canned or cooked fruits that are soft or moist and do not have skin or seeds. Fresh, soft bananas. Thickened fruit juices. Vegetables Soft, well-cooked vegetables in small pieces. Soft-cooked, mashed potatoes. Thickened vegetable juice. Meats and other proteins Tender, moist meats or poultry in small pieces. Moist meatballs or meatloaf. Fish without bones. Eggs or egg substitutes in small pieces. Tofu. Tempeh and meat alternatives in small pieces. Well-cooked, tender beans, peas, baked beans, and other legumes. Dairy Thickened milk. Cream cheese. Yogurt. Cottage cheese. Sour cream. Small pieces of soft cheese. Fats and oils Butter. Oils. Margarine. Mayonnaise. Gravy. Spreads. Sweets and desserts Soft, smooth, moist desserts. Pudding. Custard. Moist cakes. Jam. Jelly. Honey. Preserves. Ask   your health care provider whether you can have frozen desserts. Seasonings and other foods All seasonings and sweeteners. All sauces with small chunks. Prepared tuna, egg, or chicken salad without raw fruits or vegetables. Moist casseroles with small, tender pieces of meat. Soups with tender meat. The  items listed above may not be a complete list of foods and beverages you can eat. Contact a dietitian for more information. What foods must I avoid? Grains Coarse or dry cereals. Dry breads. Toast. Crackers. Tough, crusty breads, such as [place] bread and baguettes. Dry pancakes, waffles, and muffins. Sticky rice. Dry bread stuffing. Granola. Popcorn. Chips. Fruits Hard, crunchy, stringy, high-pulp, and juicy raw fruits such as apples, pineapple, papaya, and watermelon. Small, round fruits, such as grapes. Dried fruit and fruit leather. Vegetables All raw vegetables. Cooked corn. Rubbery or stiff cooked vegetables. Stringy vegetables, such as celery. Tough, crisp fried potatoes. Potato skins. Meats and other proteins Large pieces of meat. Dry, tough meats, such as bacon, sausage, and hot dogs. Chicken, [place], or fish with skin and bones. Crunchy peanut butter. Nuts. Seeds. Nut and seed butters. Dairy Yogurt with nuts, seeds, or large chunks. Large chunks of cheese. Sweets and desserts Dry cakes. Chewy or dry cookies. Any desserts with nuts, seeds, dry fruits, coconut, pineapple, or anything dry, sticky, or hard. Chewy caramel. Licorice. Taffy-type candies. Ask your health care provider whether you can have frozen desserts. Seasonings and other foods Soups with tough or large chunks of meats, poultry, or vegetables. Corn or clam chowder. Smoothies with large chunks of fruit. The items listed above may not be a complete list of foods and beverages you should avoid. Contact a dietitian for more information. Summary  Bite size foods can be helpful for people with moderate swallowing problems.  On this dysphagia eating plan, you may eat foods that are soft, moist, and cut into pieces smaller than your thumb nail (about 15 mm by 15 mm).  You may be instructed to thicken liquids. Follow your health care provider's instructions about how to do this and to what consistency. This information is not  intended to replace advice given to you by your health care provider. Make sure you discuss any questions you have with your health care provider. Document Revised: 05/31/2020 Document Reviewed: 05/31/2020 Elsevier Patient Education  2021 [place].   Subdural Hematoma  A subdural hematoma is a collection of blood between the brain and its outer covering (dura). As the amount of blood increases, pressure builds on the brain. There are two types of subdural hematomas:  Acute. This type develops shortly after a hard, direct hit to the head and causes blood to collect very quickly. This is a medical emergency. If it is not diagnosed and treated quickly, it can lead to severe brain injury or death.  Chronic. This is when bleeding develops more slowly, over weeks or months. In some cases, this type does not cause symptoms. What are the causes? This condition is caused by bleeding (hemorrhage) from a broken (ruptured) blood vessel. In most cases, a blood vessel ruptures and bleeds because of a head injury, such as from a hard, direct hit. Head injuries can happen in car accidents, falls, assaults, or while playing sports. In rare cases, a hemorrhage can happen without a known cause (spontaneously), especially if you take blood thinners (anticoagulants). What increases the risk? This condition is more likely to develop in:  Older people.  Infants.  People who take blood thinners.  People who have  head injuries.  People who abuse alcohol. What are the signs or symptoms? Symptoms of this condition can vary depending on the size of the hematoma. Symptoms can be mild, severe, or life-threatening. They include:  Headaches.  Nausea or vomiting.  Changes in vision, such as double vision or loss of vision.  Changes in speech or trouble understanding what people say.  Loss of balance or trouble walking.  Weakness, numbness, or tingling in the arms or legs, especially on one side of the  body.  Seizures.  Change in personality.  Increased sleepiness.  Memory loss.  Loss of consciousness.  Coma. Symptoms of acute subdural hematoma can develop over minutes or hours. Symptoms of chronic subdural hematoma may develop over weeks or months. How is this diagnosed? This condition is diagnosed based on the results of:  A physical exam.  Tests of strength, reflexes, coordination, senses, manner of walking (gait), and facial and eye movements (neurological exam).  Imaging tests, such as an MRI or a CT scan. How is this treated? Treatment for this condition depends on the type of hematoma and how severe it is. Treatment for acute hematoma may include:  Emergency surgery to drain blood or remove a blood clot.  Medicines that help the body get rid of excess fluids (diuretics). These may help to reduce pressure in the brain.  Assisted breathing (ventilation). Treatment for chronic hematoma may include:  Observation and bed rest at the hospital.  Surgery. If you take blood thinners, you may need to stop taking them for a short time. You may also be given anti-seizure (anticonvulsant) medicine. Sometimes, no treatment is needed for chronic subdural hematoma. Follow these instructions at home: Activity  Avoid situations where you could injure your head again, such as in competitive sports, downhill snow sports, and horseback riding. Do not do these activities until your health care provider approves. ? Wear protective gear, such as a helmet, when participating in activities such as biking or contact sports.  Avoid too much visual stimulation while recovering. This means limiting how much you read and limiting your screen time on a smart phone, tablet, computer, or TV.  Rest as told by your health care provider. Rest helps the brain heal.  Try to avoid activities that cause physical or mental stress. Return to work or school as told by your health care provider.  Do  not lift anything that is heavier than 5 lb (2.3 kg), or the limit you are told, until your health care provider says that it is safe.  Do not drive, ride a bike, or use heavy machinery until your health care provider approves.  Always wear your seat belt when you are in a motor vehicle. Alcohol use  Do not drink alcohol if your health care provider tells you not to drink.  If you drink alcohol, limit how much you use to: ? 0-1 drink a day for women. ? 0-2 drinks a day for men. General instructions  Monitor your symptoms, and ask people around you to do the same. Recovery from brain injuries varies. Talk with your health care provider about what to expect.  Take over-the-counter and prescription medicines only as told by your health care provider. Do not take blood thinners or NSAIDs unless your health care provider approves. These include aspirin, ibuprofen, naproxen, and warfarin.  Keep your home environment safe to reduce the risk of falling.  Keep all follow-up visits as told by your health care provider. This is important. Where to  find more information  [employer] of Neurological Disorders and Stroke: MasterBoxes.it  American Academy of Neurology (AAN): http://keith.biz/  Brain Injury Association of [place]: www.biausa.org Get help right away if you:  Are taking blood thinners and you fall or you experience minor trauma to the head. If you take any blood thinners, even a very small injury can cause a subdural hematoma.  Have a bleeding disorder and you fall or you experience minor trauma to the head.  Develop any of the following symptoms after a head injury: ? Clear fluid draining from your nose or ears. ? Nausea or vomiting. ? Changes in speech or trouble understanding what people say. ? Seizures. ? Drowsiness or a decrease in alertness. ? Double vision. ? Numbness or inability to move (paralysis) in any part of your body. ? Difficulty walking or poor  coordination. ? Difficulty thinking. ? Confusion or forgetfulness. ? Personality changes. ? Irrational or aggressive behavior. These symptoms may represent a serious problem that is an emergency. Do not wait to see if the symptoms will go away. Get medical help right away. Call your local emergency services (911 in the [place] Do not drive yourself to the hospital. Summary  A subdural hematoma is a collection of blood between the brain and its outer covering (dura).  Treatment for this condition depends on what type of subdural hematoma you have and how severe it is.  Symptoms can vary from mild to severe to life-threatening.  Monitor your symptoms, and ask others around you to do the same. This information is not intended to replace advice given to you by your health care provider. Make sure you discuss any questions you have with your health care provider. Document Revised: 08/12/2018 Document Reviewed: 08/12/2018 Elsevier Patient Education  2021 [employer].

## 2021-01-25 ENCOUNTER — Other Ambulatory Visit: Payer: Self-pay | Admitting: *Deleted

## 2021-01-25 NOTE — Patient Outreach (Addendum)
[place] [facility]) Care Management  01/25/2021  [name] [birth year] [id number]   Telephone Assessment-Unsuccessful Transition of care to be completed by the provider-D/C 4/30  RN spoke with the pt's spouse [name]) who requested a call back on Thursday. Unable to discuss pt's care/needs at this time. States the [facility] agency has already reached out to arranged home visits.  Will follow up on Thursday as requested.  [name], RN Care Management Coordinator [place] Office [phone]

## 2021-01-26 DIAGNOSIS — D631 Anemia in chronic kidney disease: Secondary | ICD-10-CM | POA: Diagnosis not present

## 2021-01-26 DIAGNOSIS — E1122 Type 2 diabetes mellitus with diabetic chronic kidney disease: Secondary | ICD-10-CM | POA: Diagnosis not present

## 2021-01-26 DIAGNOSIS — N183 Chronic kidney disease, stage 3 unspecified: Secondary | ICD-10-CM | POA: Diagnosis not present

## 2021-01-26 DIAGNOSIS — I509 Heart failure, unspecified: Secondary | ICD-10-CM | POA: Diagnosis not present

## 2021-01-26 DIAGNOSIS — E875 Hyperkalemia: Secondary | ICD-10-CM | POA: Diagnosis not present

## 2021-01-26 DIAGNOSIS — E785 Hyperlipidemia, unspecified: Secondary | ICD-10-CM | POA: Diagnosis not present

## 2021-01-26 DIAGNOSIS — I2510 Atherosclerotic heart disease of native coronary artery without angina pectoris: Secondary | ICD-10-CM | POA: Diagnosis not present

## 2021-01-26 DIAGNOSIS — J449 Chronic obstructive pulmonary disease, unspecified: Secondary | ICD-10-CM | POA: Diagnosis not present

## 2021-01-26 DIAGNOSIS — H3500 Unspecified background retinopathy: Secondary | ICD-10-CM | POA: Diagnosis not present

## 2021-01-26 DIAGNOSIS — N2581 Secondary hyperparathyroidism of renal origin: Secondary | ICD-10-CM | POA: Diagnosis not present

## 2021-01-27 ENCOUNTER — Other Ambulatory Visit: Payer: Self-pay | Admitting: *Deleted

## 2021-01-27 DIAGNOSIS — K5790 Diverticulosis of intestine, part unspecified, without perforation or abscess without bleeding: Secondary | ICD-10-CM | POA: Diagnosis not present

## 2021-01-27 DIAGNOSIS — E785 Hyperlipidemia, unspecified: Secondary | ICD-10-CM | POA: Diagnosis not present

## 2021-01-27 DIAGNOSIS — I4891 Unspecified atrial fibrillation: Secondary | ICD-10-CM | POA: Diagnosis not present

## 2021-01-27 DIAGNOSIS — I130 Hypertensive heart and chronic kidney disease with heart failure and stage 1 through stage 4 chronic kidney disease, or unspecified chronic kidney disease: Secondary | ICD-10-CM | POA: Diagnosis not present

## 2021-01-27 DIAGNOSIS — N184 Chronic kidney disease, stage 4 (severe): Secondary | ICD-10-CM | POA: Diagnosis not present

## 2021-01-27 DIAGNOSIS — I214 Non-ST elevation (NSTEMI) myocardial infarction: Secondary | ICD-10-CM | POA: Diagnosis not present

## 2021-01-27 DIAGNOSIS — I5043 Acute on chronic combined systolic (congestive) and diastolic (congestive) heart failure: Secondary | ICD-10-CM | POA: Diagnosis not present

## 2021-01-27 DIAGNOSIS — I5082 Biventricular heart failure: Secondary | ICD-10-CM | POA: Diagnosis not present

## 2021-01-27 DIAGNOSIS — E1122 Type 2 diabetes mellitus with diabetic chronic kidney disease: Secondary | ICD-10-CM | POA: Diagnosis not present

## 2021-01-27 DIAGNOSIS — I2510 Atherosclerotic heart disease of native coronary artery without angina pectoris: Secondary | ICD-10-CM | POA: Diagnosis not present

## 2021-01-27 NOTE — Patient Outreach (Signed)
[facility] [facility]) Care Management  01/27/2021  [name] Mar 30, 1941 [id number]   Telephone Assessment-Unsuccessful  RN was requested to call back today however unsuccessful with the outreach call. Call both the home contact [phone] left HIPP message and cell [phone] (wife [name])line was busy.  Will attempt another outreach next week for ongoing [facility] services.  [name], RN Care Management Coordinator [place] Office [phone]

## 2021-01-31 DIAGNOSIS — I6203 Nontraumatic chronic subdural hemorrhage: Secondary | ICD-10-CM | POA: Diagnosis not present

## 2021-02-02 ENCOUNTER — Other Ambulatory Visit: Payer: Self-pay | Admitting: *Deleted

## 2021-02-02 DIAGNOSIS — I2510 Atherosclerotic heart disease of native coronary artery without angina pectoris: Secondary | ICD-10-CM | POA: Diagnosis not present

## 2021-02-02 DIAGNOSIS — I4891 Unspecified atrial fibrillation: Secondary | ICD-10-CM | POA: Diagnosis not present

## 2021-02-02 DIAGNOSIS — E785 Hyperlipidemia, unspecified: Secondary | ICD-10-CM | POA: Diagnosis not present

## 2021-02-02 DIAGNOSIS — K5790 Diverticulosis of intestine, part unspecified, without perforation or abscess without bleeding: Secondary | ICD-10-CM | POA: Diagnosis not present

## 2021-02-02 DIAGNOSIS — E1122 Type 2 diabetes mellitus with diabetic chronic kidney disease: Secondary | ICD-10-CM | POA: Diagnosis not present

## 2021-02-02 DIAGNOSIS — I130 Hypertensive heart and chronic kidney disease with heart failure and stage 1 through stage 4 chronic kidney disease, or unspecified chronic kidney disease: Secondary | ICD-10-CM | POA: Diagnosis not present

## 2021-02-02 DIAGNOSIS — I214 Non-ST elevation (NSTEMI) myocardial infarction: Secondary | ICD-10-CM | POA: Diagnosis not present

## 2021-02-02 DIAGNOSIS — I5082 Biventricular heart failure: Secondary | ICD-10-CM | POA: Diagnosis not present

## 2021-02-02 DIAGNOSIS — I5043 Acute on chronic combined systolic (congestive) and diastolic (congestive) heart failure: Secondary | ICD-10-CM | POA: Diagnosis not present

## 2021-02-02 DIAGNOSIS — N184 Chronic kidney disease, stage 4 (severe): Secondary | ICD-10-CM | POA: Diagnosis not present

## 2021-02-02 NOTE — Patient Outreach (Signed)
[place] [facility]) Care Management  02/02/2021  Harris Penton 1941/09/23 [id number]   Telephone Assessment-Successful-Diabetes  Spoke with pt's spouse [name], pt is recovering well with pending appointments verified. Plan of care reviewed and discussed with all updates concerning pt's ongoing management of care. Some discrepancy related to pt's Plavix and ASA dosing being discussed with providers. RN strongly encouraged spouse to follow up with the provider if no update received in a few days. Also offered [facility] pharmacy assistance however spouse opt to decline.  Pt continue to tolerate his SSI with glucose readings ranging from 61-95 with no signs or related symptoms of hypo-hyperglycemia. Will continue to encourage adherence to the plan of care discussed  Today and offer any needed assistance related to pt's ongoing management of care.   Will follow up next month with ongoing case management services and continue to update the provider accordingly.  Goals Addressed            This Visit's Progress   . [facility] CM: Make and Keep All Appointments   On track    Follow Up Date 03/03/2021 Timeframe:  Short-Term Goal Priority:  Medium Start Date:    11/01/2020                         Expected End Date:   03/24/2021                      - ask family or friend for a ride - keep a calendar with prescription refill dates - keep a calendar with appointment dates    Why is this important?   Part of staying healthy is seeing the doctor for follow-up care.  If you forget your appointments, there are some things you can do to stay on track.   Barriers: Health Behaviors  Notes:  5/11-Will verified all pending appointments and continue to encourage adherence with all that is scheduled verified pt has sufficient transportation to all pending medical appointments and continue and continue to be adherent to all follow-up instructions presented.  3/7- Spouse verifies pt attendance to all medical  appointments with no needed refills on his medications.  Feb-Spouse verified pt continues to have sufficient transportation to all medical appointments. Review all medication and verified refills. Pt taking accordingly to prescription orders. Will continue to encouraged adherence and re-evaluate next month. [place] been out of the [place] and recently returned.  07/27/20 -- reviewed recent and upcoming provider appointments with spouse- confirmed she will continue to provide transportation and has plans to attend all scheduled appointments -- placed care coordination outreach to PCP to initiate triage/ possible urgent appointment to address patient's nonspecific symptoms of weakness with request to address insulin management  08/24/20: -- reviewed recent and upcoming provider appointments with caregiver and confirmed that patient has stable reliable transportation and still drives self; wife able to assist if/ as needed    . THN CM: Manage My Medicine   On track    Follow Up Date 03/03/2021 Timeframe:  Short-Term Goal Priority:  Medium Start Date:   11/01/2020                          Expected End Date: 03/24/2021                   - call for medicine refill 2 or 3 days before it runs out - call if I am  sick and can't take my medicine - keep a list of all the medicines I take; vitamins and herbals too    Why is this important?   These steps will help you keep on track with your medicines.   Barriers: Health Behaviors  Notes:  5/11- Pt continue to work with his providers on confirming certain medications. Currently pending MD-MD consult to verified if pt should remain on his Plavix and ASA. RN strongly encouraged pt's caregiver spouse to follow up if no confirmation over the next few days.  3/7-Due to ongoing changes in pt's medication to extend to allow ongoing adherence on pt's compliance. Verified no changes in pt's medications and no adding  of supplements. Will continue to encouraged  adherence with all medications and changes accordingly. Wife is very supportive in assisting pt in managing is medications. Feb-Verified sufficient refills on all medications and pt has enough supplies. Will reiterated on the above goal and interventions related for ongoing prevention measures. Will reassess next month on pt's progress.  07/27/20: -- discussed basic sick day rules with caregiver and placed care coordination to PCP to initiate triage follow up for nonspecific symptoms of weakness reported today by caregiver  08/24/20: -- medication review completed with caregiver today; no concerns or discrepancies identified; confirmed patient's caregiver/ spouse continues to manage patient's medications    . THN CM: Monitor and Manage My Blood Sugar   On track    Follow Up Date 03/03/2021 Timeframe:  Short-Term Goal Priority:  Medium Start Date:    11/01/2020                         Expected End Date:   03/24/2021                    - check blood sugar at prescribed times - check blood sugar if I feel it is too high or too low - enter blood sugar readings and medication or insulin into daily log - take the blood sugar log to all doctor visits - take the blood sugar meter to all doctor visits    Why is this important?   Checking your blood sugar at home helps to keep it from getting very high or very low.  Writing the results in a diary or log helps the doctor know how to care for you.  Your blood sugar log should have the time, date and the results.  Also, write down the amount of insulin or other medicine that you take.  Other information, like what you ate, exercise done and how you were feeling, will also be helpful.    Barriers: Health Behaviors  Notes:  5/11-Pt reports BS reads from 61-95 on the new SSI with no acute issues. Pt recent discharge and currently having issues with medication confirmation on Plavix and ASA. Will be confirmed by the neurologist. Will continue to encourage  with his ongoing monitoring. 3/7-Verified pt's fasting glucose readings are around 91 AM. States continue to the SSI. States appetite not as good with small meals in between as pt states "he's not hungry". Reporting to MD alone with pt's dizziness and lightheadedness but no falls. Discussed the importance f strength building with mobility and hydration however caution due to pt's HF. [place] office has been notified. Encouraged ongoing safety and use of DME to prevent falls.  Feb-Will verified readings with ranges from 65-79 and recent change in medication not with a SSI on the same  insulin medication. Will continue to encourage ongoing documentation of all readings for pt's provider to view his daily glucose reads.Further discussed dietary habits that can affect his overall A1c readings over the course of 3 months even with good daily glucose reads. Will continue to educate on high protein and low carbohydrates dietary habits..  07/27/20: -- reviewed with caregiver patients recent blood sugar values at home  08/24/20: -- reviewed with caregiver recent blood sugars at home -- confirmed patient's previously reported clinical symptoms and low blood sugar episodes have resolved with recent changes to insulin dosing -- confirmed that patient continues following diabetic diet-- previously provided education reinforced       [name], RN Care Management Coordinator [place] Office [phone]

## 2021-02-03 ENCOUNTER — Other Ambulatory Visit: Payer: Self-pay | Admitting: Neurosurgery

## 2021-02-03 DIAGNOSIS — I6203 Nontraumatic chronic subdural hemorrhage: Secondary | ICD-10-CM

## 2021-02-08 ENCOUNTER — Other Ambulatory Visit: Payer: Self-pay

## 2021-02-08 ENCOUNTER — Ambulatory Visit
Admission: RE | Admit: 2021-02-08 | Discharge: 2021-02-08 | Disposition: A | Discharge status: Home / Self Care | Payer: Medicare HMO | Source: Ambulatory Visit | Attending: Neurosurgery | Admitting: Neurosurgery

## 2021-02-08 DIAGNOSIS — I6203 Nontraumatic chronic subdural hemorrhage: Secondary | ICD-10-CM

## 2021-02-08 DIAGNOSIS — I611 Nontraumatic intracerebral hemorrhage in hemisphere, cortical: Secondary | ICD-10-CM | POA: Diagnosis not present

## 2021-02-08 DIAGNOSIS — I639 Cerebral infarction, unspecified: Secondary | ICD-10-CM | POA: Diagnosis not present

## 2021-02-08 DIAGNOSIS — I6200 Nontraumatic subdural hemorrhage, unspecified: Secondary | ICD-10-CM | POA: Diagnosis not present

## 2021-02-08 DIAGNOSIS — G319 Degenerative disease of nervous system, unspecified: Secondary | ICD-10-CM | POA: Diagnosis not present

## 2021-02-09 DIAGNOSIS — K5790 Diverticulosis of intestine, part unspecified, without perforation or abscess without bleeding: Secondary | ICD-10-CM | POA: Diagnosis not present

## 2021-02-09 DIAGNOSIS — I5082 Biventricular heart failure: Secondary | ICD-10-CM | POA: Diagnosis not present

## 2021-02-09 DIAGNOSIS — I5043 Acute on chronic combined systolic (congestive) and diastolic (congestive) heart failure: Secondary | ICD-10-CM | POA: Diagnosis not present

## 2021-02-09 DIAGNOSIS — E785 Hyperlipidemia, unspecified: Secondary | ICD-10-CM | POA: Diagnosis not present

## 2021-02-09 DIAGNOSIS — I4891 Unspecified atrial fibrillation: Secondary | ICD-10-CM | POA: Diagnosis not present

## 2021-02-09 DIAGNOSIS — E1122 Type 2 diabetes mellitus with diabetic chronic kidney disease: Secondary | ICD-10-CM | POA: Diagnosis not present

## 2021-02-09 DIAGNOSIS — I214 Non-ST elevation (NSTEMI) myocardial infarction: Secondary | ICD-10-CM | POA: Diagnosis not present

## 2021-02-09 DIAGNOSIS — I2510 Atherosclerotic heart disease of native coronary artery without angina pectoris: Secondary | ICD-10-CM | POA: Diagnosis not present

## 2021-02-09 DIAGNOSIS — I130 Hypertensive heart and chronic kidney disease with heart failure and stage 1 through stage 4 chronic kidney disease, or unspecified chronic kidney disease: Secondary | ICD-10-CM | POA: Diagnosis not present

## 2021-02-09 DIAGNOSIS — N184 Chronic kidney disease, stage 4 (severe): Secondary | ICD-10-CM | POA: Diagnosis not present

## 2021-02-11 ENCOUNTER — Ambulatory Visit: Payer: Self-pay | Admitting: *Deleted

## 2021-02-14 DIAGNOSIS — I6203 Nontraumatic chronic subdural hemorrhage: Secondary | ICD-10-CM | POA: Diagnosis not present

## 2021-02-16 ENCOUNTER — Encounter (HOSPITAL_COMMUNITY): Payer: Medicare HMO | Admitting: Internal Medicine

## 2021-02-23 DIAGNOSIS — I130 Hypertensive heart and chronic kidney disease with heart failure and stage 1 through stage 4 chronic kidney disease, or unspecified chronic kidney disease: Secondary | ICD-10-CM | POA: Diagnosis not present

## 2021-02-23 DIAGNOSIS — I5020 Unspecified systolic (congestive) heart failure: Secondary | ICD-10-CM | POA: Diagnosis not present

## 2021-02-23 DIAGNOSIS — S065X9A Traumatic subdural hemorrhage with loss of consciousness of unspecified duration, initial encounter: Secondary | ICD-10-CM | POA: Diagnosis not present

## 2021-02-23 DIAGNOSIS — J449 Chronic obstructive pulmonary disease, unspecified: Secondary | ICD-10-CM | POA: Diagnosis not present

## 2021-02-23 DIAGNOSIS — E46 Unspecified protein-calorie malnutrition: Secondary | ICD-10-CM | POA: Diagnosis not present

## 2021-02-23 DIAGNOSIS — I25118 Atherosclerotic heart disease of native coronary artery with other forms of angina pectoris: Secondary | ICD-10-CM | POA: Diagnosis not present

## 2021-02-23 DIAGNOSIS — N184 Chronic kidney disease, stage 4 (severe): Secondary | ICD-10-CM | POA: Diagnosis not present

## 2021-02-23 DIAGNOSIS — E1122 Type 2 diabetes mellitus with diabetic chronic kidney disease: Secondary | ICD-10-CM | POA: Diagnosis not present

## 2021-03-03 ENCOUNTER — Other Ambulatory Visit: Payer: Self-pay | Admitting: *Deleted

## 2021-03-03 NOTE — Patient Outreach (Signed)
[place] [facility]) Care Management  03/03/2021  Davaughn Hillyard May 31, 1941 [id number]   Telephone Assessment-Unsuccessful  RN attempted outreach call however unsuccessful. RN able to  leave a HIPAA approved voice message requesting a call back.  Will rescheduled another outreach next week for ongoing [facility] services.  [name], RN Care Management Coordinator [place] Office [phone]

## 2021-03-04 ENCOUNTER — Ambulatory Visit (HOSPITAL_BASED_OUTPATIENT_CLINIC_OR_DEPARTMENT_OTHER)
Admission: RE | Admit: 2021-03-04 | Discharge: 2021-03-04 | Disposition: A | Discharge status: Home / Self Care | Payer: Medicare HMO | Source: Ambulatory Visit | Attending: Internal Medicine | Admitting: Internal Medicine

## 2021-03-04 ENCOUNTER — Encounter (HOSPITAL_COMMUNITY): Payer: Self-pay | Admitting: Internal Medicine

## 2021-03-04 ENCOUNTER — Ambulatory Visit (HOSPITAL_COMMUNITY)
Admission: RE | Admit: 2021-03-04 | Discharge: 2021-03-04 | Disposition: A | Discharge status: Home / Self Care | Payer: Medicare HMO | Source: Ambulatory Visit | Attending: Internal Medicine | Admitting: Internal Medicine

## 2021-03-04 ENCOUNTER — Other Ambulatory Visit: Payer: Self-pay

## 2021-03-04 VITALS — BP 92/80 | HR 87 | Ht 69.0 in | Wt 149.2 lb

## 2021-03-04 DIAGNOSIS — I447 Left bundle-branch block, unspecified: Secondary | ICD-10-CM

## 2021-03-04 DIAGNOSIS — I5022 Chronic systolic (congestive) heart failure: Secondary | ICD-10-CM | POA: Diagnosis not present

## 2021-03-04 DIAGNOSIS — R0602 Shortness of breath: Secondary | ICD-10-CM | POA: Insufficient documentation

## 2021-03-04 DIAGNOSIS — F17210 Nicotine dependence, cigarettes, uncomplicated: Secondary | ICD-10-CM | POA: Insufficient documentation

## 2021-03-04 DIAGNOSIS — Z794 Long term (current) use of insulin: Secondary | ICD-10-CM | POA: Diagnosis not present

## 2021-03-04 DIAGNOSIS — Z8249 Family history of ischemic heart disease and other diseases of the circulatory system: Secondary | ICD-10-CM | POA: Insufficient documentation

## 2021-03-04 DIAGNOSIS — E1136 Type 2 diabetes mellitus with diabetic cataract: Secondary | ICD-10-CM | POA: Insufficient documentation

## 2021-03-04 DIAGNOSIS — I2510 Atherosclerotic heart disease of native coronary artery without angina pectoris: Secondary | ICD-10-CM | POA: Diagnosis not present

## 2021-03-04 DIAGNOSIS — N185 Chronic kidney disease, stage 5: Secondary | ICD-10-CM | POA: Insufficient documentation

## 2021-03-04 DIAGNOSIS — Z7982 Long term (current) use of aspirin: Secondary | ICD-10-CM | POA: Diagnosis not present

## 2021-03-04 DIAGNOSIS — I080 Rheumatic disorders of both mitral and aortic valves: Secondary | ICD-10-CM | POA: Insufficient documentation

## 2021-03-04 DIAGNOSIS — N184 Chronic kidney disease, stage 4 (severe): Secondary | ICD-10-CM | POA: Diagnosis not present

## 2021-03-04 DIAGNOSIS — I4891 Unspecified atrial fibrillation: Secondary | ICD-10-CM | POA: Diagnosis not present

## 2021-03-04 DIAGNOSIS — Z7902 Long term (current) use of antithrombotics/antiplatelets: Secondary | ICD-10-CM | POA: Insufficient documentation

## 2021-03-04 DIAGNOSIS — Z8673 Personal history of transient ischemic attack (TIA), and cerebral infarction without residual deficits: Secondary | ICD-10-CM | POA: Insufficient documentation

## 2021-03-04 DIAGNOSIS — E1122 Type 2 diabetes mellitus with diabetic chronic kidney disease: Secondary | ICD-10-CM | POA: Insufficient documentation

## 2021-03-04 DIAGNOSIS — I132 Hypertensive heart and chronic kidney disease with heart failure and with stage 5 chronic kidney disease, or end stage renal disease: Secondary | ICD-10-CM | POA: Insufficient documentation

## 2021-03-04 DIAGNOSIS — Z66 Do not resuscitate: Secondary | ICD-10-CM | POA: Insufficient documentation

## 2021-03-04 DIAGNOSIS — I5082 Biventricular heart failure: Secondary | ICD-10-CM | POA: Insufficient documentation

## 2021-03-04 DIAGNOSIS — Z79899 Other long term (current) drug therapy: Secondary | ICD-10-CM | POA: Diagnosis not present

## 2021-03-04 DIAGNOSIS — I428 Other cardiomyopathies: Secondary | ICD-10-CM | POA: Insufficient documentation

## 2021-03-04 LAB — BASIC METABOLIC PANEL
Anion gap: 9 (ref 5–15)
BUN: 49 mg/dL — ABNORMAL HIGH (ref 8–23)
CO2: 29 mmol/L (ref 22–32)
Calcium: 9.5 mg/dL (ref 8.9–10.3)
Chloride: 101 mmol/L (ref 98–111)
Creatinine, Ser: 2.76 mg/dL — ABNORMAL HIGH (ref 0.61–1.24)
GFR, Estimated: 23 mL/min — ABNORMAL LOW (ref >60)
Glucose, Bld: 222 mg/dL — ABNORMAL HIGH (ref 70–99)
Potassium: 4.8 mmol/L (ref 3.5–5.1)
Sodium: 139 mmol/L (ref 135–145)

## 2021-03-04 LAB — ECHOCARDIOGRAM COMPLETE
Calc EF: 21.1 %
MV M vel: 3.9 m/s
MV Peak grad: 60.8 mmHg
Radius: 0.2 cm
S' Lateral: 4.9 cm
Single Plane A2C EF: 21.9 %
Single Plane A4C EF: 19.1 %

## 2021-03-04 LAB — CBC
HCT: 51.1 % (ref 39.0–52.0)
Hemoglobin: 15.9 g/dL (ref 13.0–17.0)
MCH: 29.7 pg (ref 26.0–34.0)
MCHC: 31.1 g/dL (ref 30.0–36.0)
MCV: 95.5 fL (ref 80.0–100.0)
Platelets: 172 10*3/uL (ref 150–400)
RBC: 5.35 MIL/uL (ref 4.22–5.81)
RDW: 17.7 % — ABNORMAL HIGH (ref 11.5–15.5)
WBC: 3.9 10*3/uL — ABNORMAL LOW (ref 4.0–10.5)
nRBC: 0 % (ref 0.0–0.2)

## 2021-03-04 LAB — BRAIN NATRIURETIC PEPTIDE: B Natriuretic Peptide: 3831.8 pg/mL — ABNORMAL HIGH (ref 0.0–100.0)

## 2021-03-04 MED ORDER — TORSEMIDE 20 MG PO TABS: 40.0000 mg | ORAL_TABLET | Freq: Every day | ORAL | 2 refills | Status: AC

## 2021-03-04 NOTE — Progress Notes (Signed)
Dr.Bensimhon completed DNR form for this patient. Copy given to the patient

## 2021-03-04 NOTE — Addendum Note (Signed)
Encounter addended by: Stanford Scotland, RN on: 03/04/2021 3:40 PM  Actions taken: Order list changed, Diagnosis association updated

## 2021-03-04 NOTE — Patient Instructions (Signed)
Increase Torsemide to 40 mg Daily  Labs done today, your results will be available in MyChart, we will contact you for abnormal readings.  You have been referred to hospice,they will call you.  Your physician recommends that you schedule a follow-up appointment in: 2 months   If you have any questions or concerns before your next appointment please send [place] a message through [place] or call our office at [phone].    TO LEAVE A MESSAGE FOR THE NURSE SELECT OPTION 2, PLEASE LEAVE A MESSAGE INCLUDING: YOUR NAME DATE OF BIRTH CALL BACK NUMBER REASON FOR CALL**this is important as we prioritize the call backs  YOU WILL RECEIVE A CALL BACK THE SAME DAY AS LONG AS YOU CALL BEFORE 4:00 PM  At the [facility], you and your health needs are our priority. As part of our continuing mission to provide you with exceptional heart care, we have created designated Provider Care Teams. These Care Teams include your primary Cardiologist (physician) and Advanced Practice Providers (APPs- Physician Assistants and Nurse Practitioners) who all work together to provide you with the care you need, when you need it.   You may see any of the following providers on your designated Care Team at your next follow up: Dr Glori Bickers Dr Loralie Champagne Dr Patrice Paradise, NP Lyda Jester, [place] Ginnie Smart Audry Riles, PharmD   Please be sure to bring in all your medications bottles to every appointment.

## 2021-03-04 NOTE — Progress Notes (Signed)
ADVANCED HF CLINIC NOTE  PCP: Dr Dagmar Hait  Primary HF Cardiologist: Dr Haroldine Laws   HPI: [name] is a 80 y.o. 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 Echo EF 15-20% with severe RV dysfunction. Had RHC/LHC with volume overlaod and high grade disttal LM lesion. CT surgery consulted. He was not thought to be surgery candidate given severe RV dysfunction, advanced age, fraility and CKD. Case reviewed with Dr. Burt Knack with regard to possibility of LM stenting. It is highly calcified lesion and with severely reduced EF would need probable Impella support but PAD likely precludes. Additionally, LV was down prior to CAD so may not improve with stenting -> medical management recommended. Improved markedly with diuresis. D/c weight 146   Readmitted to [facility] in 8/20 with poorly controlled DM2 (HgBa1c 15%). Treated with IV insulin and IVF.  Recently we have been following for weakness and weight loss. Carvedilol and Bidil had been stopped by [name] (Nephrology).Torsemide decreased. Midodrine added.   Added 5/22 and found to have bilateral SDH. Seen by NSU and felt not to be candidate for drainage. Managed medically. HF was stable.   Here for post-hospital f/u with his wife. Says appetite is improved a bit. Has gained about 2 pounds. Very SOB with any activity. +orthopnea. Mild ankle edema. Says he has felt this way for a year.  No HAs or other neuro symptoms. Recent creatinine up to 3.0. Saw [name] last week. Increased torsemide to daily (20mg )   Echo today EF 20-25% severe MR. RV moderately  decreased Personally reviewed   PYP 11/21 negative   Echo  04/12/20 EF 35-40%   RHC/LHC 02/07/2019  Mid RCA to Dist RCA lesion is 50% stenosed. Prox RCA lesion is 40% stenosed. Mid LM to Dist LM lesion is 80% stenosed. RPDA lesion is 90% stenosed. Ost Cx to Prox Cx lesion is 80% stenosed. Dist LM to Ost LAD lesion is 60% stenosed. Prox Cx to Mid Cx lesion is 50% stenosed. Prox LAD to Mid LAD lesion is 70% stenosed.  Findings:  Ao = 136/76 (97) LV = 139/28 RA = 9 RV = 65/10 PA = 67/29 (48) PCW = 36 Fick cardiac output/index = 4.1/2.2 PVR = 3.0 WU SVR = 1721 Assessment: 1. Severe 2v CAD with probable high grade, calcified distal left main lesion 2. EF 20-25% due to iCM 3. Elevated filling pressures with moderately reduced CO   Echo  EF 40-45% in 2015  EF 25-30% in 2016 EF 35-40% in 2017 EF 15-20% in 2020  Severe RV dysfunction.   ROS: All systems negative except as listed in HPI, PMH and Problem List.  SH:  Social History   Socioeconomic History   Marital status: Married    Spouse name: Bowman Higbie    Number of children: 3   Years of education: Not on file   Highest education level: Not on file  Occupational History   Occupation: Retired  Tobacco Use   Smoking status: Every Day    Packs/day: 0.75    Years: 50.00    Pack years: 37.50    Types: Cigarettes  Smokeless tobacco: Never   Tobacco comments:    4 cigs/day currently  Vaping Use   Vaping Use: Never used  Substance and Sexual Activity   Alcohol use: Not Currently    Comment: rarely   Drug use: No   Sexual activity: Not on file  Other Topics Concern   Not on file  Social History Narrative   Not on file   Social Determinants of Health   Financial Resource Strain: Low Risk    Difficulty of Paying Living Expenses: Not very hard  Food Insecurity: No Food Insecurity   Worried About Running Out of Food in the Last Year: Never true   Ran Out of Food in the Last Year: Never true  Transportation  Needs: No Transportation Needs   Lack of Transportation (Medical): No   Lack of Transportation (Non-Medical): No  Physical Activity: Not on file  Stress: Not on file  Social Connections: Not on file  Intimate Partner Violence: Not on file    FH:  Family History  Problem Relation Age of Onset   Diabetes Mellitus II Mother    Heart disease Brother        before age 56   Colon cancer Neg Hx    Esophageal cancer Neg Hx    Stomach cancer Neg Hx    Anesthesia problems Neg Hx    Hypotension Neg Hx    Malignant hyperthermia Neg Hx    Pseudochol deficiency Neg Hx     Past Medical History:  Diagnosis Date   Allergy    Atrial fibrillation (HCC)    Cataract    CHF (congestive heart failure) ([place])    Chronic kidney disease    Renal Insuffiecny   Colon polyps 2012   Colonoscopy   Diabetes mellitus    type 2   Diverticulosis 2012   Colonoscopy    Hyperglycemia 04/2019   Hyperlipidemia    Hypertension    LBBB (left bundle branch block)    Pneumonia 2015   3 times   Stroke [facility]) 2015    Current Outpatient Medications  Medication Sig Dispense Refill   aspirin 81 MG EC tablet take one tab by mouth once daily     insulin aspart protamine- aspart (NOVOLOG MIX 70/30) (70-30) 100 UNIT/ML injection Inject 8-16 Units into the skin 2 (two) times daily. Sliding scale     Insulin Pen Needle 32G X 4 MM MISC Use as directed daily at bedtime. 60 each 0   iron polysaccharides (FERREX 150) 150 MG capsule Take 1 capsule (150 mg total) by mouth daily. 30 capsule 0   midodrine (PROAMATINE) 5 MG tablet Take 1 tablet (5 mg total) by mouth 3 (three) times daily with meals. 90 tablet 3   Multiple Vitamin (MULTIVITAMIN WITH MINERALS) TABS tablet Take 1 tablet by mouth daily.     ONETOUCH VERIO test strip 3 (three) times daily.     simvastatin (ZOCOR) 40 MG tablet Take 40 mg by mouth daily.     torsemide (DEMADEX) 20 MG tablet Take 1 tablet (20 mg total) by mouth daily. 30 tablet 2   No current  facility-administered medications for this encounter.      Vitals:   03/04/21 1003  BP: 92/80  Pulse: 87  SpO2: 100%  Weight: 67.7 kg (149 lb 3.2 oz)  Height: 5\' 9"  (1.753 m)   Wt Readings from Last 3 Encounters:  03/04/21 67.7 kg (149 lb 3.2 oz)  01/22/21 66 kg (145 lb 8 oz)  01/10/21 65.4 kg (144 lb 3.2 oz)   PHYSICAL EXAM: General:  Thin elderly appearing. Mildly SOB HEENT: normal Neck: supple. JVP 8-10 . Carotids 2+ bilat; no bruits. No lymphadenopathy or thryomegaly appreciated. Cor: PMI nondisplaced. Regular rate & rhythm. 2-6 MR Lungs: clear Abdomen: soft, nontender, nondistended. No hepatosplenomegaly. No bruits or masses. Good bowel sounds. Extremities: no cyanosis, clubbing, rash, 2+ edema cool  Neuro: alert & orientedx3, cranial nerves grossly intact. moves all 4 extremities w/o difficulty. Affect pleasant   ASSESSMENT & PLAN:  1. Bilateral SDH - based on CT 4/22. Seen by NSU. Not candidate for draiange - repeat CT 02/08/21 with improving SDH - HF meds recently cut back and midodrine added for orthostasis - Orthostatic vital signs negative today, Continue midodrine 2.5 tid - PYP negative  2. Chronic biventricular CHF: NICM -  EF 25-30% in 2016, improved to 35-40% on echo in 2017.   - Echo  01/2019 revealed EF 15-20%, G3DD, and severe RV hypokinesis.  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 LBBB may also be playing a role - Echo 11/20 EF 20-25% RV ok   - Echo 04/12/20 EF  ~35-40%  - Echo today 03/04/21 EF 20-25% severe MR. RV moderately decreased Personally reviewed - He is now endstage. EF worse with severe biventricular failure. NYHA IV with progressive volume overload - Increase torsemide to 40 daily - Carvedilol and Bidil on hold due to low BP - Off spironolactone due to CKD (Last creatinine 2.9) - Not candidate for SGLT2i with GFR < 20 - LBBB may also be playing a role (though it is atypical) - He saw Dr. Caryl Comes in 12/20 and felt not to be candidate for ICD and felt  he would not benefit from CRT either. - Long talk about his situation. Will refer to Palliative Care/Hospice.  [place] Rm form for DNR/DNI completed in Clinic   3. CAD: - Cath 02/07/19 with likely high-grade distal LM lesion.  - Case has been reviewed with Dr. Prescott Gum who has reviewed images. LAD only graftable vessel and given severe RV dysfunction, advanced age, fraility and CKD not felt to be CABG candidate. Case reviewed with Dr. Burt Knack with regard to possibility of LM stenting. It is highly calcified lesion and with severely reduced EF would need probable Impella support but PAD likely precludes. Additionally, LV was down prior to CAD so may not improve with stenting. I suspect medical management may be best.  - No s/s angina - On ASA/Plavix. No bleeding     4.CKD V:  - Follows with [name]. Most recent SCr 3.0 - Labs today   5. History of CVA:  - He is on ASA 81, Plavix, and statin.    6. DM type 2, poorly controlled: - Followed by PCP  7. DNR/DNI - Gold form filled out today  Total time spent 45 minutes. Over half that time spent discussing above.     Glori Bickers, MD  10:13 AM

## 2021-03-04 NOTE — Progress Notes (Signed)
  Echocardiogram 2D Echocardiogram has been performed.  [name] 03/04/2021, 9:53 AM

## 2021-03-08 ENCOUNTER — Other Ambulatory Visit: Payer: Self-pay | Admitting: *Deleted

## 2021-03-08 NOTE — Patient Outreach (Signed)
[facility]) Care Management  03/08/2021  [name] 1940/12/08 [id number]   Telephone Assessment-Successful-Diabetes  RN spoke with pt's spouse [name] who provided an update on pt's ongoing management of care. States pt continue to do well with glucose readings around 90-96 in the AM and pt continue to be mobile and independent with his ADLs. Verified with neurologist that pt will remain on Plavix and ASA for daily dosing. Pt has followed up with his CAD who indicates pt is getting weakness and Palliative has been called in for ongoing consultation (pending call from this service). Endocrinologist visit reports new A1C at 7.5. State pt continues with his mobility with more outings tolerated however pt continues to smoke and does not wish to quit at this time.   Plan of care reviewed and discussed with noted updates within the plan of care. Will update provider for quarterly and follow up next month for update on pt's ongoing management of care.   Goals Addressed             This Visit's Progress    COMPLETED: THN CM: Make and Keep All Appointments   On track    Follow Up Date 03/03/2021 Timeframe:  Short-Term Goal Priority:  Medium Start Date:    11/01/2020                         Expected End Date:   03/24/2021                      - ask family or friend for a ride - keep a calendar with prescription refill dates - keep a calendar with appointment dates    Why is this important?   Part of staying healthy is seeing the doctor for follow-up care.  If you forget your appointments, there are some things you can do to stay on track.   Barriers: Health Behaviors  Notes:  5/11-Will verified all pending appointments and continue to encourage adherence with all that is scheduled verified pt has sufficient transportation to all pending medical appointments and continue and continue to be adherent to all follow-up instructions presented.  3/7- Spouse verifies pt attendance  to all medical appointments with no needed refills on his medications.  Feb-Spouse verified pt continues to have sufficient transportation to all medical appointments. Review all medication and verified refills. Pt taking accordingly to prescription orders. Will continue to encouraged adherence and re-evaluate next month. [place].  07/27/20 -- reviewed recent and upcoming provider appointments with spouse- confirmed she will continue to provide transportation and has plans to attend all scheduled appointments -- placed care coordination outreach to PCP to initiate triage/ possible urgent appointment to address patient's nonspecific symptoms of weakness with request to address insulin management  08/24/20: -- reviewed recent and upcoming provider appointments with caregiver and confirmed that patient has stable reliable transportation and still drives self; wife able to assist if/ as needed      [facility] CM: Manage My Medicine   On track    Follow Up Date 04/08/2021 Timeframe:  Short-Term Goal Priority:  Medium Start Date:   11/01/2020                          Expected End Date:   04/22/2021                  -  call for medicine refill 2 or 3 days before it runs out - call if I am sick and can't take my medicine - keep a list of all the medicines I take; vitamins and herbals too    Why is this important?   These steps will help you keep on track with your medicines.   Barriers: Health Behaviors  Notes:  6/14-Pt continue to have all his medications and no needed refills. Confirms pt is taking both ASA and Plavix via pt's neurology. 5/11- Pt continue to work with his providers on confirming certain medications. Currently pending MD-MD consult to verified if pt should remain on his Plavix and ASA. RN strongly encouraged pt's caregiver spouse to follow up if no confirmation over the next few days.  3/7-Due to ongoing changes in pt's medication to extend to  allow ongoing adherence on pt's compliance. Verified no changes in pt's medications and no adding  of supplements. Will continue to encouraged adherence with all medications and changes accordingly. Wife is very supportive in assisting pt in managing is medications. Feb-Verified sufficient refills on all medications and pt has enough supplies. Will reiterated on the above goal and interventions related for ongoing prevention measures. Will reassess next month on pt's progress.  07/27/20: -- discussed basic sick day rules with caregiver and placed care coordination to PCP to initiate triage follow up for nonspecific symptoms of weakness reported today by caregiver  08/24/20: -- medication review completed with caregiver today; no concerns or discrepancies identified; confirmed patient's caregiver/ spouse continues to manage patient's medications      COMPLETED: THN CM: Monitor and Manage My Blood Sugar   On track    Follow Up Date 03/03/2021 Timeframe:  Short-Term Goal Priority:  Medium Start Date:    11/01/2020                         Expected End Date:   03/24/2021                    - check blood sugar at prescribed times - check blood sugar if I feel it is too high or too low - enter blood sugar readings and medication or insulin into daily log - take the blood sugar log to all doctor visits - take the blood sugar meter to all doctor visits    Why is this important?   Checking your blood sugar at home helps to keep it from getting very high or very low.  Writing the results in a diary or log helps the doctor know how to care for you.  Your blood sugar log should have the time, date and the results.  Also, write down the amount of insulin or other medicine that you take.  Other information, like what you ate, exercise done and how you were feeling, will also be helpful.    Barriers: Health Behaviors  Notes:  5/11-Pt reports BS reads from 61-95 on the new SSI with no acute issues. Pt  recent discharge and currently having issues with medication confirmation on Plavix and ASA. Will be confirmed by the neurologist. Will continue to encourage with his ongoing monitoring. 3/7-Verified pt's fasting glucose readings are around 91 AM. States continue to the SSI. States appetite not as good with small meals in between as pt states "he's not hungry". Reporting to MD alone with pt's dizziness and lightheadedness but no falls. Discussed the importance f strength building with mobility and hydration  however caution due to pt's HF. [place] office has been notified. Encouraged ongoing safety and use of DME to prevent falls.  Feb-Will verified readings with ranges from 65-79 and recent change in medication not with a SSI on the same insulin medication. Will continue to encourage ongoing documentation of all readings for pt's provider to view his daily glucose reads.Further discussed dietary habits that can affect his overall A1c readings over the course of 3 months even with good daily glucose reads. Will continue to educate on high protein and low carbohydrates dietary habits..  07/27/20: -- reviewed with caregiver patients recent blood sugar values at home  08/24/20: -- reviewed with caregiver recent blood sugars at home -- confirmed patient's previously reported clinical symptoms and low blood sugar episodes have resolved with recent changes to insulin dosing -- confirmed that patient continues following diabetic diet-- previously provided education reinforced          [name], RN Care Management Coordinator [place] Office [phone]

## 2021-03-15 ENCOUNTER — Telehealth: Payer: Self-pay

## 2021-03-15 NOTE — Telephone Encounter (Signed)
Spoke with patient's wife [name] and scheduled an in-person Palliative Consult for 04/08/21 @ 9AM.   COVID screening was negative. No pets in home. Patient lives with wife.  Consent obtained; updated Outlook/Netsmart/Team List and Epic.   Family is aware they may be receiving a call from NP the day before or day of to confirm appointment.

## 2021-03-23 ENCOUNTER — Telehealth: Payer: Self-pay | Admitting: Student

## 2021-03-24 DIAGNOSIS — I25119 Atherosclerotic heart disease of native coronary artery with unspecified angina pectoris: Secondary | ICD-10-CM | POA: Diagnosis not present

## 2021-03-24 DIAGNOSIS — I6203 Nontraumatic chronic subdural hemorrhage: Secondary | ICD-10-CM | POA: Diagnosis not present

## 2021-03-24 DIAGNOSIS — I69391 Dysphagia following cerebral infarction: Secondary | ICD-10-CM | POA: Diagnosis not present

## 2021-03-24 DIAGNOSIS — Z515 Encounter for palliative care: Secondary | ICD-10-CM | POA: Diagnosis not present

## 2021-03-24 DIAGNOSIS — I130 Hypertensive heart and chronic kidney disease with heart failure and stage 1 through stage 4 chronic kidney disease, or unspecified chronic kidney disease: Secondary | ICD-10-CM | POA: Diagnosis not present

## 2021-03-24 DIAGNOSIS — I739 Peripheral vascular disease, unspecified: Secondary | ICD-10-CM | POA: Diagnosis not present

## 2021-03-24 DIAGNOSIS — D631 Anemia in chronic kidney disease: Secondary | ICD-10-CM | POA: Diagnosis not present

## 2021-03-24 DIAGNOSIS — R69 Illness, unspecified: Secondary | ICD-10-CM | POA: Diagnosis not present

## 2021-03-24 DIAGNOSIS — I255 Ischemic cardiomyopathy: Secondary | ICD-10-CM | POA: Diagnosis not present

## 2021-03-25 NOTE — Telephone Encounter (Signed)
Spoke with patient's wife, [name], and have rescheduled the Palliative Care consult with Dr. Hollace Kinnier, documentation will be noted in [facility]'s EMR Netsmart

## 2021-04-06 DIAGNOSIS — I25118 Atherosclerotic heart disease of native coronary artery with other forms of angina pectoris: Secondary | ICD-10-CM | POA: Diagnosis not present

## 2021-04-06 DIAGNOSIS — N184 Chronic kidney disease, stage 4 (severe): Secondary | ICD-10-CM | POA: Diagnosis not present

## 2021-04-06 DIAGNOSIS — I130 Hypertensive heart and chronic kidney disease with heart failure and stage 1 through stage 4 chronic kidney disease, or unspecified chronic kidney disease: Secondary | ICD-10-CM | POA: Diagnosis not present

## 2021-04-06 DIAGNOSIS — E1122 Type 2 diabetes mellitus with diabetic chronic kidney disease: Secondary | ICD-10-CM | POA: Diagnosis not present

## 2021-04-06 DIAGNOSIS — Z794 Long term (current) use of insulin: Secondary | ICD-10-CM | POA: Diagnosis not present

## 2021-04-08 ENCOUNTER — Ambulatory Visit: Payer: Self-pay | Admitting: *Deleted

## 2021-04-08 ENCOUNTER — Other Ambulatory Visit: Payer: Medicare HMO | Admitting: Student

## 2021-04-20 NOTE — Telephone Encounter (Signed)
Spoke with patient's wife and have rescheduled the 04/08/21 Palliative Consult to 03/24/21 @ 1:30 PM with Dr. Mariea Clonts.  Palliative documentation will be completed in Netsmart.

## 2021-04-21 DIAGNOSIS — I5043 Acute on chronic combined systolic (congestive) and diastolic (congestive) heart failure: Secondary | ICD-10-CM | POA: Diagnosis not present

## 2021-04-21 DIAGNOSIS — Z515 Encounter for palliative care: Secondary | ICD-10-CM | POA: Diagnosis not present

## 2021-04-21 DIAGNOSIS — N183 Chronic kidney disease, stage 3 unspecified: Secondary | ICD-10-CM | POA: Diagnosis not present

## 2021-04-21 DIAGNOSIS — I130 Hypertensive heart and chronic kidney disease with heart failure and stage 1 through stage 4 chronic kidney disease, or unspecified chronic kidney disease: Secondary | ICD-10-CM | POA: Diagnosis not present

## 2021-04-21 DIAGNOSIS — I6203 Nontraumatic chronic subdural hemorrhage: Secondary | ICD-10-CM | POA: Diagnosis not present

## 2021-04-21 DIAGNOSIS — R69 Illness, unspecified: Secondary | ICD-10-CM | POA: Diagnosis not present

## 2021-04-25 DIAGNOSIS — N189 Chronic kidney disease, unspecified: Secondary | ICD-10-CM | POA: Diagnosis not present

## 2021-04-25 DIAGNOSIS — N183 Chronic kidney disease, stage 3 unspecified: Secondary | ICD-10-CM | POA: Diagnosis not present

## 2021-04-25 DIAGNOSIS — I2510 Atherosclerotic heart disease of native coronary artery without angina pectoris: Secondary | ICD-10-CM | POA: Diagnosis not present

## 2021-04-25 DIAGNOSIS — E875 Hyperkalemia: Secondary | ICD-10-CM | POA: Diagnosis not present

## 2021-04-25 DIAGNOSIS — E785 Hyperlipidemia, unspecified: Secondary | ICD-10-CM | POA: Diagnosis not present

## 2021-04-25 DIAGNOSIS — H3500 Unspecified background retinopathy: Secondary | ICD-10-CM | POA: Diagnosis not present

## 2021-04-25 DIAGNOSIS — I509 Heart failure, unspecified: Secondary | ICD-10-CM | POA: Diagnosis not present

## 2021-04-25 DIAGNOSIS — D631 Anemia in chronic kidney disease: Secondary | ICD-10-CM | POA: Diagnosis not present

## 2021-04-25 DIAGNOSIS — J449 Chronic obstructive pulmonary disease, unspecified: Secondary | ICD-10-CM | POA: Diagnosis not present

## 2021-04-25 DIAGNOSIS — N2581 Secondary hyperparathyroidism of renal origin: Secondary | ICD-10-CM | POA: Diagnosis not present

## 2021-04-25 DIAGNOSIS — E1122 Type 2 diabetes mellitus with diabetic chronic kidney disease: Secondary | ICD-10-CM | POA: Diagnosis not present

## 2021-04-26 ENCOUNTER — Other Ambulatory Visit: Payer: Self-pay | Admitting: *Deleted

## 2021-04-26 NOTE — Progress Notes (Signed)
ADVANCED HF CLINIC NOTE  PCP: Dr Dagmar Hait  Primary HF Cardiologist: Dr Haroldine Laws   HPI: [name] is a 80 y.o. 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 Echo EF 15-20% with severe RV dysfunction. Had RHC/LHC with volume overlaod and high grade disttal LM lesion. CT surgery consulted. He was not thought to be surgery candidate given severe RV dysfunction, advanced age, fraility and CKD. Case reviewed with Dr. Burt Knack with regard to possibility of LM stenting. It is highly calcified lesion and with severely reduced EF would need probable Impella support but PAD likely precludes. Additionally, LV was down prior to CAD so may not improve with stenting -> medical management recommended. Improved markedly with diuresis. D/c weight 146   Readmitted to [facility] in 8/20 with poorly controlled DM2 (HgBa1c 15%). Treated with IV insulin and IVF.  Recently we have been following for weakness and weight loss. Carvedilol and Bidil had been stopped by [name] (Nephrology).Torsemide decreased. Midodrine added.   Added 5/22 and found to have bilateral SDH. Seen by [facility] and felt not to be candidate for drainage. Managed medically. HF was stable.   At last visit we referred to Hospice/Palliative Care. Gold Form completed. Here for f/u with his wife. Feels very weak. Remains SOB. Says sinuses are congested. Poor appetite. Has lost another 10 pounds. Saw [name] on Monday. Renal function stable.    Echo 6/22 EF 20-25% severe MR. RV moderately decreased  Cardiac studies:  PYP 11/21 negative   Echo  04/12/20  EF 35-40%   RHC/LHC 02/07/2019  Mid RCA to Dist RCA lesion is 50% stenosed. Prox RCA lesion is 40% stenosed. Mid LM to Dist LM lesion is 80% stenosed. RPDA lesion is 90% stenosed. Ost Cx to Prox Cx lesion is 80% stenosed. Dist LM to Ost LAD lesion is 60% stenosed. Prox Cx to Mid Cx lesion is 50% stenosed. Prox LAD to Mid LAD lesion is 70% stenosed.  Findings:  Ao = 136/76 (97) LV = 139/28 RA = 9 RV = 65/10 PA = 67/29 (48) PCW = 36 Fick cardiac output/index = 4.1/2.2 PVR = 3.0 WU SVR = 1721 Assessment: 1. Severe 2v CAD with probable high grade, calcified distal left main lesion 2. EF 20-25% due to iCM 3. Elevated filling pressures with moderately reduced CO   Echo  EF 40-45% in 2015  EF 25-30% in 2016 EF 35-40% in 2017 EF 15-20% in 2020  Severe RV dysfunction.   ROS: All systems negative except as listed in HPI, PMH and Problem List.  SH:  Social History   Socioeconomic History   Marital status: Married    Spouse name: Jayjay Littles    Number of children: 3   Years of education: Not on file   Highest education level: Not on file  Occupational History   Occupation: Retired  Tobacco Use   Smoking status: Every Day    Packs/day: 0.75    Years: 50.00    Pack years: 37.50    Types: Cigarettes   Smokeless tobacco: Never   Tobacco comments:    4 cigs/day currently  Vaping Use  Vaping Use: Never used  Substance and Sexual Activity   Alcohol use: Not Currently    Comment: rarely   Drug use: No   Sexual activity: Not on file  Other Topics Concern   Not on file  Social History Narrative   Not on file   Social Determinants of Health   Financial Resource Strain: Low Risk    Difficulty of Paying Living Expenses: Not very hard  Food Insecurity: No Food Insecurity   Worried About Running Out of Food in the Last Year: Never true   Ran Out of Food in the Last Year: Never true  Transportation Needs: No Transportation Needs   Lack of Transportation (Medical): No    Lack of Transportation (Non-Medical): No  Physical Activity: Not on file  Stress: Not on file  Social Connections: Not on file  Intimate Partner Violence: Not on file    FH:  Family History  Problem Relation Age of Onset   Diabetes Mellitus II Mother    Heart disease Brother        before age 78   Colon cancer Neg Hx    Esophageal cancer Neg Hx    Stomach cancer Neg Hx    Anesthesia problems Neg Hx    Hypotension Neg Hx    Malignant hyperthermia Neg Hx    Pseudochol deficiency Neg Hx     Past Medical History:  Diagnosis Date   Allergy    Atrial fibrillation (HCC)    Cataract    CHF (congestive heart failure) ([place])    Chronic kidney disease    Renal Insuffiecny   Colon polyps 2012   Colonoscopy   Diabetes mellitus    type 2   Diverticulosis 2012   Colonoscopy    Hyperglycemia 04/2019   Hyperlipidemia    Hypertension    LBBB (left bundle branch block)    Pneumonia 2015   3 times   Stroke [facility]) 2015    Current Outpatient Medications  Medication Sig Dispense Refill   aspirin 81 MG EC tablet take one tab by mouth once daily     insulin aspart protamine- aspart (NOVOLOG MIX 70/30) (70-30) 100 UNIT/ML injection Inject 8-16 Units into the skin 2 (two) times daily. Sliding scale     Insulin Pen Needle 32G X 4 MM MISC Use as directed daily at bedtime. 60 each 0   iron polysaccharides (FERREX 150) 150 MG capsule Take 1 capsule (150 mg total) by mouth daily. 30 capsule 0   midodrine (PROAMATINE) 5 MG tablet Take 1 tablet (5 mg total) by mouth 3 (three) times daily with meals. 90 tablet 3   Multiple Vitamin (MULTIVITAMIN WITH MINERALS) TABS tablet Take 1 tablet by mouth daily.     ONETOUCH VERIO test strip 3 (three) times daily.     simvastatin (ZOCOR) 40 MG tablet Take 40 mg by mouth daily.     torsemide (DEMADEX) 20 MG tablet Take 2 tablets (40 mg total) by mouth daily. 30 tablet 2   No current facility-administered medications for this encounter.      Vitals:    04/27/21 0951  BP: 102/60  Pulse: 84  SpO2: 96%  Weight: 63.7 kg (140 lb 6.4 oz)    Wt Readings from Last 3 Encounters:  04/27/21 63.7 kg (140 lb 6.4 oz)  03/04/21 67.7 kg (149 lb 3.2 oz)  01/22/21 66 kg (145 lb 8 oz)   PHYSICAL EXAM: General:  Thin elderly appearing in WC HEENT: normal Neck: supple. no  JVD. Carotids 2+ bilat; no bruits. No lymphadenopathy or thryomegaly appreciated. Cor: PMI nondisplaced. Regular rate & rhythm. 2/6 MR Lungs: clear Abdomen: soft, nontender, nondistended. No hepatosplenomegaly. No bruits or masses. Good bowel sounds. Extremities: no cyanosis, clubbing, rash, 2+ ankle edema Neuro: alert & orientedx3, cranial nerves grossly intact. moves all 4 extremities w/o difficulty. Affect pleasant   ASSESSMENT & PLAN:  1. Bilateral SDH - based on CT 4/22. Seen by [facility]U. Not candidate for draiange - repeat CT 02/08/21 with improving SDH - HF meds recently cut back and midodrine added for orthostasis - BP now stable. Continue midodrine 2.5 tid - PYP negative  2. Chronic biventricular CHF: NICM -  EF 25-30% in 2016, improved to 35-40% on echo in 2017.   - Echo  01/2019 revealed EF 15-20%, G3DD, and severe RV hypokinesis.  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 LBBB may also be playing a role - Echo 11/20 EF 20-25% RV ok   - Echo 04/12/20 EF  ~35-40%  - Echo today 03/04/21 EF 20-25% severe MR. RV moderately decreased Personally reviewed - He is now endstage. EF worse with severe biventricular failure. NYHA IV. Now has Palliative Care following.  - Volume status stable on torsemide 40 daily. He does have some ankle edema likely due to venous stasis and low protein. Will place compression hose.  - Carvedilol and Bidil on hold due to low BP - Off spironolactone due to CKD (Last creatinine 2.9) - Not candidate for SGLT2i with GFR < 20 - LBBB may also be playing a role (though it is atypical). He saw Dr. Caryl Comes in 12/20 and felt not to be candidate for ICD and felt he would not  benefit from CRT either.  3. CAD: - Cath 02/07/19 with likely high-grade distal LM lesion.  - Case has been reviewed with Dr. Prescott Gum who has reviewed images. LAD only graftable vessel and given severe RV dysfunction, advanced age, fraility and CKD not felt to be CABG candidate. Case reviewed with Dr. Burt Knack with regard to possibility of LM stenting. It is highly calcified lesion and with severely reduced EF would need probable Impella support but PAD likely precludes. Additionally, LV was down prior to CAD so may not improve with stenting.management may be best.  - No s/s angina. On ASA/statin  4.CKD V:  - Follows with [name]. Most recent SCr 2.8 (stable)    5. History of CVA:  - He is on ASA 81 and statin.    6. DM type 2, poorly controlled: - Followed by PCP  7. DNR/DNI - Gold form completed    Glori Bickers, MD  11:32 PM

## 2021-04-26 NOTE — Patient Outreach (Signed)
[facility]) Care Management  04/26/2021  [name] 04-25-1941 [id number]   Telephone Assessment-Unsuccessful  RN attempted to speak with pt today however unsuccessful  at both home and cell. RN able to leave a HIPAA approved voice message requesting a call back.  Will follow up once again with another outreach call over the next week.  [name], RN Care Management Coordinator [place] Office [phone]

## 2021-04-26 NOTE — Patient Outreach (Signed)
[facility]) Care Management  04/26/2021  Mansel Strother Mar 19, 1941 [id number]  Telephone Assessment-Successful-Diabetes  RN spoke with pt's spouse Rennis Golden) who provided an update on pt's ongoing management of care.  Care plan reviewed and discussed with noted updates.  Will follow up next month with ongoing case management needs and communicate with the pt's provider concerning pt's disposition.   Goals Addressed             This Visit's Progress    THN CM: Manage My Medicine   On track    Follow Up Date 05/27/2021 Timeframe:  Short-Term Goal Priority:  Medium Start Date:   11/01/2020                          Expected End Date:   06/24/2021                  - call for medicine refill 2 or 3 days before it runs out - call if I am sick and can't take my medicine - keep a list of all the medicines I take; vitamins and herbals too    Why is this important?   These steps will help you keep on track with your medicines.   Barriers: Health Behaviors  Notes:  8/2-Wife ([name]) indicates pt is do very well however he remains very weak due to his low EF related to his mobility. All medications are managed with Vita D added 6/14-Pt continue to have all his medications and no needed refills. Confirms pt is taking both ASA and Plavix via pt's neurology. 5/11- Pt continue to work with his providers on confirming certain medications. Currently pending MD-MD consult to verified if pt should remain on his Plavix and ASA. RN strongly encouraged pt's caregiver spouse to follow up if no confirmation over the next few days.  3/7-Due to ongoing changes in pt's medication to extend to allow ongoing adherence on pt's compliance. Verified no changes in pt's medications and no adding  of supplements. Will continue to encouraged adherence with all medications and changes accordingly. Wife is very supportive in assisting pt in managing is medications. Feb-Verified sufficient refills on all  medications and pt has enough supplies. Will reiterated on the above goal and interventions related for ongoing prevention measures. Will reassess next month on pt's progress.  07/27/20: -- discussed basic sick day rules with caregiver and placed care coordination to PCP to initiate triage follow up for nonspecific symptoms of weakness reported today by caregiver  08/24/20: -- medication review completed with caregiver today; no concerns or discrepancies identified; confirmed patient's caregiver/ spouse continues to manage patient's medications         [name], RN Care Management Coordinator [place] Office [phone]

## 2021-04-27 ENCOUNTER — Encounter (HOSPITAL_COMMUNITY): Payer: Self-pay | Admitting: Internal Medicine

## 2021-04-27 ENCOUNTER — Other Ambulatory Visit: Payer: Self-pay

## 2021-04-27 ENCOUNTER — Ambulatory Visit (HOSPITAL_COMMUNITY)
Admission: RE | Admit: 2021-04-27 | Discharge: 2021-04-27 | Disposition: A | Discharge status: Home / Self Care | Payer: Medicare HMO | Source: Ambulatory Visit | Attending: Internal Medicine | Admitting: Internal Medicine

## 2021-04-27 VITALS — BP 102/60 | HR 84 | Wt 140.4 lb

## 2021-04-27 DIAGNOSIS — N185 Chronic kidney disease, stage 5: Secondary | ICD-10-CM | POA: Insufficient documentation

## 2021-04-27 DIAGNOSIS — I5022 Chronic systolic (congestive) heart failure: Secondary | ICD-10-CM | POA: Diagnosis not present

## 2021-04-27 DIAGNOSIS — N184 Chronic kidney disease, stage 4 (severe): Secondary | ICD-10-CM | POA: Diagnosis not present

## 2021-04-27 DIAGNOSIS — Z66 Do not resuscitate: Secondary | ICD-10-CM | POA: Insufficient documentation

## 2021-04-27 DIAGNOSIS — Z8673 Personal history of transient ischemic attack (TIA), and cerebral infarction without residual deficits: Secondary | ICD-10-CM | POA: Diagnosis not present

## 2021-04-27 DIAGNOSIS — Z79899 Other long term (current) drug therapy: Secondary | ICD-10-CM | POA: Insufficient documentation

## 2021-04-27 DIAGNOSIS — Z7982 Long term (current) use of aspirin: Secondary | ICD-10-CM | POA: Insufficient documentation

## 2021-04-27 DIAGNOSIS — I2510 Atherosclerotic heart disease of native coronary artery without angina pectoris: Secondary | ICD-10-CM | POA: Insufficient documentation

## 2021-04-27 DIAGNOSIS — F17210 Nicotine dependence, cigarettes, uncomplicated: Secondary | ICD-10-CM | POA: Diagnosis not present

## 2021-04-27 DIAGNOSIS — I6200 Nontraumatic subdural hemorrhage, unspecified: Secondary | ICD-10-CM | POA: Insufficient documentation

## 2021-04-27 DIAGNOSIS — E785 Hyperlipidemia, unspecified: Secondary | ICD-10-CM | POA: Diagnosis not present

## 2021-04-27 DIAGNOSIS — I132 Hypertensive heart and chronic kidney disease with heart failure and with stage 5 chronic kidney disease, or end stage renal disease: Secondary | ICD-10-CM | POA: Insufficient documentation

## 2021-04-27 DIAGNOSIS — I428 Other cardiomyopathies: Secondary | ICD-10-CM | POA: Insufficient documentation

## 2021-04-27 DIAGNOSIS — E1165 Type 2 diabetes mellitus with hyperglycemia: Secondary | ICD-10-CM | POA: Diagnosis not present

## 2021-04-27 DIAGNOSIS — Z794 Long term (current) use of insulin: Secondary | ICD-10-CM | POA: Diagnosis not present

## 2021-04-27 DIAGNOSIS — E1122 Type 2 diabetes mellitus with diabetic chronic kidney disease: Secondary | ICD-10-CM | POA: Insufficient documentation

## 2021-04-27 DIAGNOSIS — I447 Left bundle-branch block, unspecified: Secondary | ICD-10-CM | POA: Insufficient documentation

## 2021-04-27 DIAGNOSIS — I5082 Biventricular heart failure: Secondary | ICD-10-CM | POA: Diagnosis not present

## 2021-04-27 DIAGNOSIS — E1151 Type 2 diabetes mellitus with diabetic peripheral angiopathy without gangrene: Secondary | ICD-10-CM | POA: Diagnosis not present

## 2021-04-27 MED ORDER — MIDODRINE HCL 5 MG PO TABS: 5.0000 mg | ORAL_TABLET | Freq: Three times a day (TID) | ORAL | 6 refills | Status: AC

## 2021-04-27 NOTE — Patient Instructions (Signed)
Please wear your compression hose daily, place them on as soon as you get up in the morning and remove before you go to bed at night.  Your physician recommends that you schedule a follow-up appointment in: 3-4 months  Do the following things EVERYDAY: Weigh yourself in the morning before breakfast. Write it down and keep it in a log. Take your medicines as prescribed Eat low salt foods--Limit salt (sodium) to 2000 mg per day.  Stay as active as you can everyday Limit all fluids for the day to less than 2 liters  If you have any questions or concerns before your next appointment please send [name] a message through [facility] or call our office at [phone].    TO LEAVE A MESSAGE FOR THE NURSE SELECT OPTION 2, PLEASE LEAVE A MESSAGE INCLUDING: YOUR NAME DATE OF BIRTH CALL BACK NUMBER REASON FOR CALL**this is important as we prioritize the call backs  YOU WILL RECEIVE A CALL BACK THE SAME DAY AS LONG AS YOU CALL BEFORE 4:00 PM  milAt the [facility], you and your health needs are our priority. As part of our continuing mission to provide you with exceptional heart care, we have created designated Provider Care Teams. These Care Teams include your primary Cardiologist (physician) and Advanced Practice Providers (APPs- Physician Assistants and Nurse Practitioners) who all work together to provide you with the care you need, when you need it.   You may see any of the following providers on your designated Care Team at your next follow up: Dr Glori Bickers Dr Loralie Champagne Dr Patrice Paradise, NP Lyda Jester, [place] Ginnie Smart Audry Riles, PharmD   Please be sure to bring in all your medications bottles to every appointment.

## 2021-05-09 ENCOUNTER — Other Ambulatory Visit: Payer: Self-pay

## 2021-05-09 ENCOUNTER — Ambulatory Visit: Payer: Medicare HMO | Admitting: Podiatry

## 2021-05-09 DIAGNOSIS — B351 Tinea unguium: Secondary | ICD-10-CM | POA: Diagnosis not present

## 2021-05-09 DIAGNOSIS — M79674 Pain in right toe(s): Secondary | ICD-10-CM | POA: Diagnosis not present

## 2021-05-09 DIAGNOSIS — N184 Chronic kidney disease, stage 4 (severe): Secondary | ICD-10-CM | POA: Diagnosis not present

## 2021-05-09 DIAGNOSIS — E1122 Type 2 diabetes mellitus with diabetic chronic kidney disease: Secondary | ICD-10-CM

## 2021-05-09 DIAGNOSIS — Z89431 Acquired absence of right foot: Secondary | ICD-10-CM | POA: Diagnosis not present

## 2021-05-09 DIAGNOSIS — M79675 Pain in left toe(s): Secondary | ICD-10-CM

## 2021-05-10 ENCOUNTER — Encounter: Payer: Self-pay | Admitting: Podiatry

## 2021-05-10 NOTE — Progress Notes (Signed)
  Subjective:  Patient ID: [name], male    DOB: 1940-11-12,  MRN: [id number]  Chief Complaint  Patient presents with   Nail Problem    NP  diabetic nail trim    80 y.o. male presents with the above complaint. History confirmed with patient.  He has a history of a transmetatarsal amp from previous puncture wound his A1c is 7.9%.  Nails in the left foot are thickened elongated and causing pain  Objective:  Physical Exam: warm, good capillary refill and no trophic changes or ulcerative lesions.  Well-healed transmetatarsal amputation on the right.  Dystrophic elongated yellow toenails on the left with subungual debris.  Significant neuropathy Assessment:   1. Pain due to onychomycosis of toenails of both feet   2. History of transmetatarsal amputation of right foot ([place])   3. Type 2 diabetes mellitus with stage 4 chronic kidney disease, without long-term current use of insulin ([place])      Plan:  Patient was evaluated and treated and all questions answered.  Patient educated on diabetes. Discussed proper diabetic foot care and discussed risks and complications of disease. Educated patient in depth on reasons to return to the office immediately should he/she discover anything concerning or new on the feet. All questions answered. Discussed proper shoes as well.   With his history of significant neuropathy and amputation history I would recommend he be in a extra-depth diabetic shoe with a custom molded insole and amputation filler on the right.  He will be scheduled for fitting for this.  Also recommend an AFO to help with ambulation he currently has a double upright style AFO and this has not been helpful for him.  We will have him scheduled with our pedorthist for fitting  Discussed the etiology and treatment options for the condition in detail with the patient. Educated patient on the topical and oral treatment options for mycotic nails. Recommended debridement of the nails today.  Sharp and mechanical debridement performed of all painful and mycotic nails today. Nails debrided in length and thickness using a nail nipper to level of comfort. Discussed treatment options including appropriate shoe gear. Follow up as needed for painful nails.   Return in about 3 months (around 08/09/2021) for at risk diabetic foot care.

## 2021-05-19 DIAGNOSIS — Z515 Encounter for palliative care: Secondary | ICD-10-CM | POA: Diagnosis not present

## 2021-05-19 DIAGNOSIS — R69 Illness, unspecified: Secondary | ICD-10-CM | POA: Diagnosis not present

## 2021-05-19 DIAGNOSIS — I6203 Nontraumatic chronic subdural hemorrhage: Secondary | ICD-10-CM | POA: Diagnosis not present

## 2021-05-19 DIAGNOSIS — I25119 Atherosclerotic heart disease of native coronary artery with unspecified angina pectoris: Secondary | ICD-10-CM | POA: Diagnosis not present

## 2021-05-19 DIAGNOSIS — I255 Ischemic cardiomyopathy: Secondary | ICD-10-CM | POA: Diagnosis not present

## 2021-05-19 DIAGNOSIS — E441 Mild protein-calorie malnutrition: Secondary | ICD-10-CM | POA: Diagnosis not present

## 2021-05-23 ENCOUNTER — Other Ambulatory Visit: Payer: Medicare HMO

## 2021-06-01 ENCOUNTER — Other Ambulatory Visit: Payer: Self-pay | Admitting: *Deleted

## 2021-06-01 NOTE — Patient Outreach (Signed)
[facility]) Care Management  06/01/2021  [name] [birth year] [id number]  Telephone Assessment-Successful-Diabetes  Spoke with pt's spouse [name] who reports pt's progress and states pt's daily blood glucose levels (97-110). Plan of care discussed and updated accordingly. Next A1C on Oct via primary provider. Wife interested on PCI device due to pt's ongoing weakness and limiting mobility. Pt has worked with [place] however unsuccessful and wife indicates she has inquired on out patient therapy however this was not initiated. Currently wife will discuss with [name] and possible seek a referral from pt's CAD Dr. Caryl Comes concerning her inquires on the PCI device to assist pt with possible more energy.  Will follow up next month with ongoing case management needs and further engage on pt's progress related to any interventions that may take place over the next month.    Goals Addressed             This Visit's Progress    THN CM: Manage My Medicine   On track    Follow Up Date 06/29/2021 Timeframe:  Short-Term Goal Priority:  Medium Start Date:   11/01/2020                          Expected End Date:   07/25/2021                  - call for medicine refill 2 or 3 days before it runs out - call if I am sick and can't take my medicine - keep a list of all the medicines I take; vitamins and herbals too    Why is this important?   These steps will help you keep on track with your medicines.   Barriers: Health Behaviors  Notes:  9/7-Wife states pt continue to take adherence with taking all his medications however pt continue to be weak with limited mobility. Wife discussing possible intervention for PCI to assist pt with increase level of energy. Will continue to encourage adherence with his ongoing management of care  8/2-Wife ([name]) indicates pt is do very well however he remains very weak due to his low EF related to his mobility. All medications are managed  with Vita D added 6/14-Pt continue to have all his medications and no needed refills. Confirms pt is taking both ASA and Plavix via pt's neurology. 5/11- Pt continue to work with his providers on confirming certain medications. Currently pending MD-MD consult to verified if pt should remain on his Plavix and ASA. RN strongly encouraged pt's caregiver spouse to follow up if no confirmation over the next few days.  3/7-Due to ongoing changes in pt's medication to extend to allow ongoing adherence on pt's compliance. Verified no changes in pt's medications and no adding  of supplements. Will continue to encouraged adherence with all medications and changes accordingly. Wife is very supportive in assisting pt in managing is medications. Feb-Verified sufficient refills on all medications and pt has enough supplies. Will reiterated on the above goal and interventions related for ongoing prevention measures. Will reassess next month on pt's progress.  07/27/20: -- discussed basic sick day rules with caregiver and placed care coordination to PCP to initiate triage follow up for nonspecific symptoms of weakness reported today by caregiver  08/24/20: -- medication review completed with caregiver today; no concerns or discrepancies identified; confirmed patient's caregiver/ spouse continues to manage patient's medications         [name], RN Care Management  [place] Office [phone]

## 2021-06-10 ENCOUNTER — Other Ambulatory Visit: Payer: Self-pay

## 2021-06-10 ENCOUNTER — Other Ambulatory Visit: Payer: Medicare HMO

## 2021-06-13 ENCOUNTER — Telehealth: Payer: Self-pay | Admitting: Podiatry

## 2021-06-13 NOTE — Telephone Encounter (Signed)
Pts spouse Partick Musselman left message for someone to call her back from this dept about the appt pt had yesterday.    I returned call and left message for pts wife to call me back

## 2021-06-23 ENCOUNTER — Telehealth: Payer: Self-pay | Admitting: Podiatry

## 2021-06-23 NOTE — Telephone Encounter (Signed)
Per Ioan Dibbles @ [employer] the L5000 is valid and billable code and no Josem Kaufmann is needed.. ref # [id number]  Dme is covered @ 80% no deductible. Out of pocket is 4500.00(met 1448.00) per automated system. Ref # [id number].Marland KitchenMarland Kitchen

## 2021-06-29 ENCOUNTER — Other Ambulatory Visit: Payer: Self-pay | Admitting: *Deleted

## 2021-06-29 NOTE — Patient Outreach (Signed)
[facility]) Care Management  06/29/2021  [name] 03/22/1941 [id number]   Telephone Assessment-Successful- Diabetes  Spoke with the spouse concerning pt's ongoing care. Wife states she continues to communicate with Palliative services and [name] (HF clinic). States pt is doing well however needs compression stockings when pt's has some swelling. [employer] in [place] provider which is a warehouse for compression stockings. RN also inquired on pt's HTN with reported reading last taken 108/78. Plan of care discussed and updated with notes within the plan. Reports pt's CBG range within 98-107 on his ongoing SSI with 2-4 units as needed. Spouse states pt continues to be weak and she is responsible for "getting him out of the bed". States she is still looking into the PCI valve that may assist the pt increase his O2.  Will follow up next month with update on pt's ongoing management of care. Offered quarterly follow ups however spouse has requested ongoing monthly calls of inquires and management of care.      Goals Addressed             This Visit's Progress    THN CM: Manage My Medicine       Follow Up Date 07/29/2021 Timeframe:  Short-Term Goal Priority:  Medium Start Date:   11/01/2020                          Expected End Date:   08/24/2021                  - call for medicine refill 2 or 3 days before it runs out - call if I am sick and can't take my medicine - keep a list of all the medicines I take; vitamins and herbals too    Why is this important?   These steps will help you keep on track with your medicines.   Barriers: Health Behaviors  Notes:  10/5- Wife continue to have an interested in the PCI valve to assist pt with his breathing however seeking a specialist that may support this procedure. No medication changes over this last month.  9/7-Wife states pt continue to take adherence with taking all his medications however pt continue  to be weak with limited mobility. Wife discussing possible intervention for PCI to assist pt with increase level of energy. Will continue to encourage adherence with his ongoing management of care  8/2-Wife ([name]) indicates pt is do very well however he remains very weak due to his low EF related to his mobility. All medications are managed with Vita D added 6/14-Pt continue to have all his medications and no needed refills. Confirms pt is taking both ASA and Plavix via pt's neurology. 5/11- Pt continue to work with his providers on confirming certain medications. Currently pending MD-MD consult to verified if pt should remain on his Plavix and ASA. RN strongly encouraged pt's caregiver spouse to follow up if no confirmation over the next few days.  3/7-Due to ongoing changes in pt's medication to extend to allow ongoing adherence on pt's compliance. Verified no changes in pt's medications and no adding  of supplements. Will continue to encouraged adherence with all medications and changes accordingly. Wife is very supportive in assisting pt in managing is medications. Feb-Verified sufficient refills on all medications and pt has enough supplies. Will reiterated on the above goal and interventions related for ongoing prevention measures. Will reassess next month on pt's progress.  07/27/20: --  discussed basic sick day rules with caregiver and placed care coordination to PCP to initiate triage follow up for nonspecific symptoms of weakness reported today by caregiver  08/24/20: -- medication review completed with caregiver today; no concerns or discrepancies identified; confirmed patient's caregiver/ spouse continues to manage patient's medications     THN-Comorbidities Identified and Managed       Follow up: 07/29/2021 Timeframe:  Long-Range Goal Priority:  Medium Start Date:  06/29/2021                           Expected End Date:  10/24/2021                      Barriers: Health Behaviors   Evidence-based guidance:  Assess and address signs/symptoms of comorbidity, including dyslipidemia, diabetes, iron deficiency, gout, arthritis, dysrhythmia, hypertension, cachexia, coronary artery disease, kidney dysfunction and lung disease.  Prepare patient for laboratory and diagnostic exams based on risk and presentation.  Prepare for use of pharmacologic therapy that may include statin, angiotensin converting enzyme (ACE) inhibitor, angiotensin receptor blocker (ARB), beta-blocker, digoxin, antidysrhythmic, diuretic or omega-3 fatty acid.  Monitor side effects and anticipate need for periodic adjustments.  Prepare patient for potential invasive treatment, such as implantable cardioverter-defibrillator, cardiac resynchronization therapy or heart transplant as disease progresses.   Notes:  10/5-Discussed and educated on HF as this condition is under control however some LE swelling by the end of the day. Spouse searching for resources for compression hoses. Provided a local contact for [employer] out of [place] (warehouse for compression stockings). Reports BP at 108/78 controlled along with CBG at 98-107 as pt remains in his SSI. All medical conditions are being managed well.          [name], RN Care Management Coordinator [place] Office [phone]

## 2021-07-04 DIAGNOSIS — J449 Chronic obstructive pulmonary disease, unspecified: Secondary | ICD-10-CM | POA: Diagnosis not present

## 2021-07-04 DIAGNOSIS — I5020 Unspecified systolic (congestive) heart failure: Secondary | ICD-10-CM | POA: Diagnosis not present

## 2021-07-04 DIAGNOSIS — E1122 Type 2 diabetes mellitus with diabetic chronic kidney disease: Secondary | ICD-10-CM | POA: Diagnosis not present

## 2021-07-04 DIAGNOSIS — S065X9A Traumatic subdural hemorrhage with loss of consciousness of unspecified duration, initial encounter: Secondary | ICD-10-CM | POA: Diagnosis not present

## 2021-07-04 DIAGNOSIS — E11311 Type 2 diabetes mellitus with unspecified diabetic retinopathy with macular edema: Secondary | ICD-10-CM | POA: Diagnosis not present

## 2021-07-04 DIAGNOSIS — I25118 Atherosclerotic heart disease of native coronary artery with other forms of angina pectoris: Secondary | ICD-10-CM | POA: Diagnosis not present

## 2021-07-04 DIAGNOSIS — N184 Chronic kidney disease, stage 4 (severe): Secondary | ICD-10-CM | POA: Diagnosis not present

## 2021-07-04 DIAGNOSIS — I130 Hypertensive heart and chronic kidney disease with heart failure and stage 1 through stage 4 chronic kidney disease, or unspecified chronic kidney disease: Secondary | ICD-10-CM | POA: Diagnosis not present

## 2021-07-04 DIAGNOSIS — E46 Unspecified protein-calorie malnutrition: Secondary | ICD-10-CM | POA: Diagnosis not present

## 2021-07-05 ENCOUNTER — Ambulatory Visit: Payer: Medicare HMO | Admitting: Podiatry

## 2021-07-06 ENCOUNTER — Other Ambulatory Visit: Payer: Self-pay | Admitting: *Deleted

## 2021-07-06 ENCOUNTER — Encounter (HOSPITAL_COMMUNITY): Payer: Self-pay | Admitting: Emergency Medicine

## 2021-07-06 ENCOUNTER — Other Ambulatory Visit: Payer: Self-pay

## 2021-07-06 ENCOUNTER — Inpatient Hospital Stay (HOSPITAL_COMMUNITY)
Admission: EM | Admit: 2021-07-06 | Discharge: 2021-07-26 | DRG: 682 | Disposition: E | Payer: Medicare HMO | Attending: Internal Medicine | Admitting: Internal Medicine

## 2021-07-06 DIAGNOSIS — I5084 End stage heart failure: Secondary | ICD-10-CM | POA: Diagnosis present

## 2021-07-06 DIAGNOSIS — Z515 Encounter for palliative care: Secondary | ICD-10-CM

## 2021-07-06 DIAGNOSIS — I4891 Unspecified atrial fibrillation: Secondary | ICD-10-CM | POA: Diagnosis present

## 2021-07-06 DIAGNOSIS — Z66 Do not resuscitate: Secondary | ICD-10-CM

## 2021-07-06 DIAGNOSIS — R339 Retention of urine, unspecified: Secondary | ICD-10-CM | POA: Diagnosis not present

## 2021-07-06 DIAGNOSIS — E860 Dehydration: Secondary | ICD-10-CM | POA: Diagnosis present

## 2021-07-06 DIAGNOSIS — R600 Localized edema: Secondary | ICD-10-CM

## 2021-07-06 DIAGNOSIS — Z6823 Body mass index (BMI) 23.0-23.9, adult: Secondary | ICD-10-CM

## 2021-07-06 DIAGNOSIS — Z743 Need for continuous supervision: Secondary | ICD-10-CM | POA: Diagnosis not present

## 2021-07-06 DIAGNOSIS — Z833 Family history of diabetes mellitus: Secondary | ICD-10-CM

## 2021-07-06 DIAGNOSIS — I10 Essential (primary) hypertension: Secondary | ICD-10-CM | POA: Diagnosis present

## 2021-07-06 DIAGNOSIS — I447 Left bundle-branch block, unspecified: Secondary | ICD-10-CM | POA: Diagnosis present

## 2021-07-06 DIAGNOSIS — Z794 Long term (current) use of insulin: Secondary | ICD-10-CM

## 2021-07-06 DIAGNOSIS — I130 Hypertensive heart and chronic kidney disease with heart failure and stage 1 through stage 4 chronic kidney disease, or unspecified chronic kidney disease: Secondary | ICD-10-CM | POA: Diagnosis present

## 2021-07-06 DIAGNOSIS — I5022 Chronic systolic (congestive) heart failure: Secondary | ICD-10-CM | POA: Diagnosis present

## 2021-07-06 DIAGNOSIS — Z8249 Family history of ischemic heart disease and other diseases of the circulatory system: Secondary | ICD-10-CM

## 2021-07-06 DIAGNOSIS — N184 Chronic kidney disease, stage 4 (severe): Secondary | ICD-10-CM | POA: Diagnosis present

## 2021-07-06 DIAGNOSIS — L8990 Pressure ulcer of unspecified site, unspecified stage: Secondary | ICD-10-CM | POA: Insufficient documentation

## 2021-07-06 DIAGNOSIS — R0602 Shortness of breath: Secondary | ICD-10-CM | POA: Diagnosis not present

## 2021-07-06 DIAGNOSIS — N4830 Priapism, unspecified: Secondary | ICD-10-CM | POA: Diagnosis present

## 2021-07-06 DIAGNOSIS — E1151 Type 2 diabetes mellitus with diabetic peripheral angiopathy without gangrene: Secondary | ICD-10-CM | POA: Diagnosis present

## 2021-07-06 DIAGNOSIS — G928 Other toxic encephalopathy: Secondary | ICD-10-CM | POA: Diagnosis present

## 2021-07-06 DIAGNOSIS — F17210 Nicotine dependence, cigarettes, uncomplicated: Secondary | ICD-10-CM | POA: Diagnosis present

## 2021-07-06 DIAGNOSIS — Z89431 Acquired absence of right foot: Secondary | ICD-10-CM

## 2021-07-06 DIAGNOSIS — R319 Hematuria, unspecified: Secondary | ICD-10-CM | POA: Diagnosis present

## 2021-07-06 DIAGNOSIS — I252 Old myocardial infarction: Secondary | ICD-10-CM

## 2021-07-06 DIAGNOSIS — I2510 Atherosclerotic heart disease of native coronary artery without angina pectoris: Secondary | ICD-10-CM | POA: Diagnosis present

## 2021-07-06 DIAGNOSIS — R64 Cachexia: Secondary | ICD-10-CM | POA: Diagnosis present

## 2021-07-06 DIAGNOSIS — I5082 Biventricular heart failure: Secondary | ICD-10-CM | POA: Diagnosis present

## 2021-07-06 DIAGNOSIS — E43 Unspecified severe protein-calorie malnutrition: Secondary | ICD-10-CM | POA: Diagnosis present

## 2021-07-06 DIAGNOSIS — R627 Adult failure to thrive: Secondary | ICD-10-CM | POA: Diagnosis present

## 2021-07-06 DIAGNOSIS — Z20822 Contact with and (suspected) exposure to covid-19: Secondary | ICD-10-CM | POA: Diagnosis present

## 2021-07-06 DIAGNOSIS — E785 Hyperlipidemia, unspecified: Secondary | ICD-10-CM | POA: Diagnosis present

## 2021-07-06 DIAGNOSIS — E1122 Type 2 diabetes mellitus with diabetic chronic kidney disease: Secondary | ICD-10-CM | POA: Diagnosis present

## 2021-07-06 DIAGNOSIS — N179 Acute kidney failure, unspecified: Principal | ICD-10-CM

## 2021-07-06 DIAGNOSIS — E875 Hyperkalemia: Secondary | ICD-10-CM | POA: Diagnosis present

## 2021-07-06 DIAGNOSIS — Z8673 Personal history of transient ischemic attack (TIA), and cerebral infarction without residual deficits: Secondary | ICD-10-CM

## 2021-07-06 DIAGNOSIS — R570 Cardiogenic shock: Secondary | ICD-10-CM

## 2021-07-06 DIAGNOSIS — R296 Repeated falls: Secondary | ICD-10-CM | POA: Diagnosis present

## 2021-07-06 DIAGNOSIS — Z7902 Long term (current) use of antithrombotics/antiplatelets: Secondary | ICD-10-CM

## 2021-07-06 DIAGNOSIS — R54 Age-related physical debility: Secondary | ICD-10-CM | POA: Diagnosis present

## 2021-07-06 DIAGNOSIS — J90 Pleural effusion, not elsewhere classified: Secondary | ICD-10-CM

## 2021-07-06 DIAGNOSIS — Z91030 Bee allergy status: Secondary | ICD-10-CM

## 2021-07-06 DIAGNOSIS — Z7982 Long term (current) use of aspirin: Secondary | ICD-10-CM

## 2021-07-06 DIAGNOSIS — I428 Other cardiomyopathies: Secondary | ICD-10-CM | POA: Diagnosis present

## 2021-07-06 DIAGNOSIS — R609 Edema, unspecified: Secondary | ICD-10-CM | POA: Diagnosis not present

## 2021-07-06 DIAGNOSIS — E1129 Type 2 diabetes mellitus with other diabetic kidney complication: Secondary | ICD-10-CM | POA: Diagnosis present

## 2021-07-06 DIAGNOSIS — Z79899 Other long term (current) drug therapy: Secondary | ICD-10-CM

## 2021-07-06 MED ORDER — MORPHINE SULFATE (PF) 4 MG/ML IV SOLN
4.0000 mg | Freq: Once | INTRAVENOUS | Status: AC
Start: 2021-07-06 — End: 2021-07-07
  Administered 2021-07-07: 4 mg via INTRAVENOUS
  Filled 2021-07-06: qty 1

## 2021-07-06 NOTE — Patient Outreach (Signed)
[facility]) Care Management  07/23/2021  [name] [birth year] [id number]   Care Coordination-Successful  RN responded to a incoming call from the office that the pt's spouse needed to speak with this RN case manager.  RN returned the call and spoke with the spouse who inquired on DME however provided a lot of details concerning how difficult it is becoming to care for the pt concerning mobility and transfers issues and how difficult is was become to utilizing family for assistance when needed to assist with getting pt out and in the bed. Wife use to be a Mudlogger for a nursing facility so she was familiar with levels of care and how to go about this process if ever needed. RN further encouraged a hospital bed and hoyer lift if pt could qualify for this DME. Provided resources and offered guide guides for community resources and a LSW if needed for respite care or placement if considering this route at this time. Other options discussed [facility] for PT/OT or outpt PT.   Spouse indicated she just wants to move the pt from upstairs to downstairs and use the hoyer life and hospital bed to support the pt with mobility. Spouse aware to contact the primary provider and make the request for the two items for [employer] (choices provided) as she is familiar with [employer] for DME. No further assistance needed.  RN provided direct contact number for RN if additional services are needed.   [name], RN Care Management Coordinator [place] Office [phone]

## 2021-07-06 NOTE — ED Triage Notes (Signed)
Pt arrives via [facility] from home for urinary retention, bilateral leg edema, and shob. Pt saw PCP 3 days ago, no med changes made. 2 days ago pt had noticeable swelling to both feet and ankles. Pt reports he hasn't urinated in the last 24 hours, and c/o shob. Pt aox3

## 2021-07-07 ENCOUNTER — Emergency Department (HOSPITAL_COMMUNITY): Payer: Medicare HMO

## 2021-07-07 DIAGNOSIS — Z8673 Personal history of transient ischemic attack (TIA), and cerebral infarction without residual deficits: Secondary | ICD-10-CM | POA: Diagnosis not present

## 2021-07-07 DIAGNOSIS — I5082 Biventricular heart failure: Secondary | ICD-10-CM

## 2021-07-07 DIAGNOSIS — I10 Essential (primary) hypertension: Secondary | ICD-10-CM | POA: Diagnosis not present

## 2021-07-07 DIAGNOSIS — Z7189 Other specified counseling: Secondary | ICD-10-CM

## 2021-07-07 DIAGNOSIS — Z66 Do not resuscitate: Secondary | ICD-10-CM

## 2021-07-07 DIAGNOSIS — Z20822 Contact with and (suspected) exposure to covid-19: Secondary | ICD-10-CM | POA: Diagnosis not present

## 2021-07-07 DIAGNOSIS — N184 Chronic kidney disease, stage 4 (severe): Secondary | ICD-10-CM

## 2021-07-07 DIAGNOSIS — I5084 End stage heart failure: Secondary | ICD-10-CM | POA: Diagnosis not present

## 2021-07-07 DIAGNOSIS — R296 Repeated falls: Secondary | ICD-10-CM | POA: Diagnosis present

## 2021-07-07 DIAGNOSIS — I5023 Acute on chronic systolic (congestive) heart failure: Secondary | ICD-10-CM

## 2021-07-07 DIAGNOSIS — G928 Other toxic encephalopathy: Secondary | ICD-10-CM | POA: Diagnosis not present

## 2021-07-07 DIAGNOSIS — Z89431 Acquired absence of right foot: Secondary | ICD-10-CM | POA: Diagnosis not present

## 2021-07-07 DIAGNOSIS — R570 Cardiogenic shock: Secondary | ICD-10-CM | POA: Diagnosis not present

## 2021-07-07 DIAGNOSIS — J90 Pleural effusion, not elsewhere classified: Secondary | ICD-10-CM

## 2021-07-07 DIAGNOSIS — R64 Cachexia: Secondary | ICD-10-CM | POA: Diagnosis not present

## 2021-07-07 DIAGNOSIS — L8990 Pressure ulcer of unspecified site, unspecified stage: Secondary | ICD-10-CM | POA: Insufficient documentation

## 2021-07-07 DIAGNOSIS — E860 Dehydration: Secondary | ICD-10-CM | POA: Diagnosis present

## 2021-07-07 DIAGNOSIS — E1122 Type 2 diabetes mellitus with diabetic chronic kidney disease: Secondary | ICD-10-CM | POA: Diagnosis not present

## 2021-07-07 DIAGNOSIS — N179 Acute kidney failure, unspecified: Principal | ICD-10-CM

## 2021-07-07 DIAGNOSIS — R627 Adult failure to thrive: Secondary | ICD-10-CM | POA: Diagnosis not present

## 2021-07-07 DIAGNOSIS — I428 Other cardiomyopathies: Secondary | ICD-10-CM | POA: Diagnosis not present

## 2021-07-07 DIAGNOSIS — E1151 Type 2 diabetes mellitus with diabetic peripheral angiopathy without gangrene: Secondary | ICD-10-CM | POA: Diagnosis present

## 2021-07-07 DIAGNOSIS — I5022 Chronic systolic (congestive) heart failure: Secondary | ICD-10-CM | POA: Diagnosis not present

## 2021-07-07 DIAGNOSIS — I2510 Atherosclerotic heart disease of native coronary artery without angina pectoris: Secondary | ICD-10-CM | POA: Diagnosis present

## 2021-07-07 DIAGNOSIS — N170 Acute kidney failure with tubular necrosis: Secondary | ICD-10-CM | POA: Diagnosis not present

## 2021-07-07 DIAGNOSIS — Z515 Encounter for palliative care: Secondary | ICD-10-CM | POA: Diagnosis not present

## 2021-07-07 DIAGNOSIS — E875 Hyperkalemia: Secondary | ICD-10-CM | POA: Diagnosis present

## 2021-07-07 DIAGNOSIS — N4830 Priapism, unspecified: Secondary | ICD-10-CM | POA: Diagnosis present

## 2021-07-07 DIAGNOSIS — R0602 Shortness of breath: Secondary | ICD-10-CM | POA: Diagnosis not present

## 2021-07-07 DIAGNOSIS — E43 Unspecified severe protein-calorie malnutrition: Secondary | ICD-10-CM | POA: Diagnosis not present

## 2021-07-07 DIAGNOSIS — I130 Hypertensive heart and chronic kidney disease with heart failure and stage 1 through stage 4 chronic kidney disease, or unspecified chronic kidney disease: Secondary | ICD-10-CM | POA: Diagnosis not present

## 2021-07-07 DIAGNOSIS — Z6823 Body mass index (BMI) 23.0-23.9, adult: Secondary | ICD-10-CM | POA: Diagnosis not present

## 2021-07-07 LAB — CREATININE, SERUM
Creatinine, Ser: 4.84 mg/dL — ABNORMAL HIGH (ref 0.61–1.24)
GFR, Estimated: 12 mL/min — ABNORMAL LOW (ref >60)

## 2021-07-07 LAB — URINALYSIS, ROUTINE W REFLEX MICROSCOPIC
Bilirubin Urine: NEGATIVE
Glucose, UA: NEGATIVE mg/dL
Hgb urine dipstick: NEGATIVE
Ketones, ur: NEGATIVE mg/dL
Nitrite: NEGATIVE
Protein, ur: 30 mg/dL — AB
Specific Gravity, Urine: 1.012 (ref 1.005–1.030)
pH: 5 (ref 5.0–8.0)

## 2021-07-07 LAB — CBC WITH DIFFERENTIAL/PLATELET
Abs Immature Granulocytes: 0.44 10*3/uL — ABNORMAL HIGH (ref 0.00–0.07)
Basophils Absolute: 0.1 10*3/uL (ref 0.0–0.1)
Basophils Relative: 1 %
Eosinophils Absolute: 0 10*3/uL (ref 0.0–0.5)
Eosinophils Relative: 0 %
HCT: 50.7 % (ref 39.0–52.0)
Hemoglobin: 18 g/dL — ABNORMAL HIGH (ref 13.0–17.0)
Immature Granulocytes: 5 %
Lymphocytes Relative: 2 %
Lymphs Abs: 0.2 10*3/uL — ABNORMAL LOW (ref 0.7–4.0)
MCH: 31.9 pg (ref 26.0–34.0)
MCHC: 35.5 g/dL (ref 30.0–36.0)
MCV: 89.9 fL (ref 80.0–100.0)
Monocytes Absolute: 0.7 10*3/uL (ref 0.1–1.0)
Monocytes Relative: 7 %
Neutro Abs: 8.1 10*3/uL — ABNORMAL HIGH (ref 1.7–7.7)
Neutrophils Relative %: 85 %
Platelets: 63 10*3/uL — ABNORMAL LOW (ref 150–400)
RBC: 5.64 MIL/uL (ref 4.22–5.81)
RDW: 21.6 % — ABNORMAL HIGH (ref 11.5–15.5)
WBC Morphology: INCREASED
WBC: 9.5 10*3/uL (ref 4.0–10.5)
nRBC: 0.9 % — ABNORMAL HIGH (ref 0.0–0.2)

## 2021-07-07 LAB — COMPREHENSIVE METABOLIC PANEL
ALT: 50 U/L — ABNORMAL HIGH (ref 0–44)
AST: 68 U/L — ABNORMAL HIGH (ref 15–41)
Albumin: 2.6 g/dL — ABNORMAL LOW (ref 3.5–5.0)
Alkaline Phosphatase: 149 U/L — ABNORMAL HIGH (ref 38–126)
Anion gap: 18 — ABNORMAL HIGH (ref 5–15)
BUN: 132 mg/dL — ABNORMAL HIGH (ref 8–23)
CO2: 19 mmol/L — ABNORMAL LOW (ref 22–32)
Calcium: 8.9 mg/dL (ref 8.9–10.3)
Chloride: 96 mmol/L — ABNORMAL LOW (ref 98–111)
Creatinine, Ser: 4.81 mg/dL — ABNORMAL HIGH (ref 0.61–1.24)
GFR, Estimated: 12 mL/min — ABNORMAL LOW (ref >60)
Glucose, Bld: 145 mg/dL — ABNORMAL HIGH (ref 70–99)
Potassium: 5.4 mmol/L — ABNORMAL HIGH (ref 3.5–5.1)
Sodium: 133 mmol/L — ABNORMAL LOW (ref 135–145)
Total Bilirubin: 2.8 mg/dL — ABNORMAL HIGH (ref 0.3–1.2)
Total Protein: 5.8 g/dL — ABNORMAL LOW (ref 6.5–8.1)

## 2021-07-07 LAB — OSMOLALITY, URINE: Osmolality, Ur: 349 mOsm/kg (ref 300–900)

## 2021-07-07 LAB — SODIUM, URINE, RANDOM: Sodium, Ur: <10 mmol/L

## 2021-07-07 LAB — RESP PANEL BY RT-PCR (FLU A&B, COVID) ARPGX2
Influenza A by PCR: NEGATIVE
Influenza B by PCR: NEGATIVE
SARS Coronavirus 2 by RT PCR: NEGATIVE

## 2021-07-07 LAB — CREATININE, URINE, RANDOM: Creatinine, Urine: 107.63 mg/dL

## 2021-07-07 LAB — CBC
HCT: 51 % (ref 39.0–52.0)
Hemoglobin: 18 g/dL — ABNORMAL HIGH (ref 13.0–17.0)
MCH: 32.1 pg (ref 26.0–34.0)
MCHC: 35.3 g/dL (ref 30.0–36.0)
MCV: 91.1 fL (ref 80.0–100.0)
Platelets: 49 10*3/uL — ABNORMAL LOW (ref 150–400)
RBC: 5.6 MIL/uL (ref 4.22–5.81)
RDW: 22.3 % — ABNORMAL HIGH (ref 11.5–15.5)
WBC: 11.8 10*3/uL — ABNORMAL HIGH (ref 4.0–10.5)
nRBC: 0.7 % — ABNORMAL HIGH (ref 0.0–0.2)

## 2021-07-07 LAB — TROPONIN I (HIGH SENSITIVITY)
Troponin I (High Sensitivity): 316 ng/L (ref <18)
Troponin I (High Sensitivity): 314 ng/L (ref <18)

## 2021-07-07 LAB — GLUCOSE, CAPILLARY
Glucose-Capillary: 137 mg/dL — ABNORMAL HIGH (ref 70–99)
Glucose-Capillary: 124 mg/dL — ABNORMAL HIGH (ref 70–99)

## 2021-07-07 LAB — HEMOGLOBIN A1C
Hgb A1c MFr Bld: 7.6 % — ABNORMAL HIGH (ref 4.8–5.6)
Mean Plasma Glucose: 171.42 mg/dL

## 2021-07-07 LAB — BRAIN NATRIURETIC PEPTIDE: B Natriuretic Peptide: >4500 pg/mL — ABNORMAL HIGH (ref 0.0–100.0)

## 2021-07-07 LAB — OSMOLALITY: Osmolality: 335 mOsm/kg (ref 275–295)

## 2021-07-07 MED ORDER — SIMVASTATIN 20 MG PO TABS
40.0000 mg | ORAL_TABLET | Freq: Every day | ORAL | Status: DC
  Administered 2021-07-07: 40 mg via ORAL
  Filled 2021-07-07: qty 2

## 2021-07-07 MED ORDER — LIDOCAINE HCL (PF) 1 % IJ SOLN
30.0000 mL | Freq: Once | INTRAMUSCULAR | Status: AC
  Administered 2021-07-07: 30 mL
  Filled 2021-07-07: qty 30

## 2021-07-07 MED ORDER — ORAL CARE MOUTH RINSE
15.0000 mL | Freq: Two times a day (BID) | OROMUCOSAL | Status: DC
  Administered 2021-07-07 – 2021-07-08 (×2): 15 mL via OROMUCOSAL

## 2021-07-07 MED ORDER — BACITRACIN-NEOMYCIN-POLYMYXIN OINTMENT TUBE
TOPICAL_OINTMENT | Freq: Three times a day (TID) | CUTANEOUS | Status: DC
  Administered 2021-07-07 – 2021-07-08 (×3): 1 via TOPICAL
  Filled 2021-07-07: qty 14

## 2021-07-07 MED ORDER — ACETAMINOPHEN 325 MG PO TABS: 650.0000 mg | ORAL_TABLET | Freq: Four times a day (QID) | ORAL | Status: DC | PRN

## 2021-07-07 MED ORDER — ASPIRIN EC 81 MG PO TBEC
81.0000 mg | DELAYED_RELEASE_TABLET | Freq: Every day | ORAL | Status: DC
  Administered 2021-07-07: 81 mg via ORAL
  Filled 2021-07-07: qty 1

## 2021-07-07 MED ORDER — SODIUM ZIRCONIUM CYCLOSILICATE 10 G PO PACK
10.0000 g | PACK | Freq: Three times a day (TID) | ORAL | Status: AC
  Administered 2021-07-07 (×2): 10 g via ORAL
  Filled 2021-07-07 (×2): qty 1

## 2021-07-07 MED ORDER — HEPARIN SODIUM (PORCINE) 5000 UNIT/ML IJ SOLN
5000.0000 [IU] | Freq: Three times a day (TID) | INTRAMUSCULAR | Status: DC
  Administered 2021-07-07 (×2): 5000 [IU] via SUBCUTANEOUS
  Filled 2021-07-07 (×2): qty 1

## 2021-07-07 MED ORDER — CEFAZOLIN SODIUM-DEXTROSE 1-4 GM/50ML-% IV SOLN
1.0000 g | Freq: Two times a day (BID) | INTRAVENOUS | Status: DC
  Administered 2021-07-08: 1 g via INTRAVENOUS
  Filled 2021-07-07 (×2): qty 50

## 2021-07-07 MED ORDER — HYDROMORPHONE HCL 1 MG/ML IJ SOLN
1.0000 mg | Freq: Once | INTRAMUSCULAR | Status: AC
Start: 2021-07-07 — End: 2021-07-07
  Administered 2021-07-07: 1 mg via INTRAVENOUS
  Filled 2021-07-07: qty 1

## 2021-07-07 MED ORDER — TRAMADOL HCL 50 MG PO TABS: 50.0000 mg | ORAL_TABLET | Freq: Four times a day (QID) | ORAL | Status: DC | PRN

## 2021-07-07 MED ORDER — CLOPIDOGREL BISULFATE 75 MG PO TABS
75.0000 mg | ORAL_TABLET | Freq: Every day | ORAL | Status: DC
  Administered 2021-07-07: 75 mg via ORAL
  Filled 2021-07-07: qty 1

## 2021-07-07 MED ORDER — INSULIN ASPART 100 UNIT/ML IJ SOLN: 0.0000 [IU] | Freq: Four times a day (QID) | INTRAMUSCULAR | Status: DC

## 2021-07-07 MED ORDER — CEFAZOLIN SODIUM-DEXTROSE 1-4 GM/50ML-% IV SOLN
1.0000 g | Freq: Three times a day (TID) | INTRAVENOUS | Status: DC
  Administered 2021-07-07 (×2): 1 g via INTRAVENOUS
  Filled 2021-07-07 (×4): qty 50

## 2021-07-07 MED ORDER — HEPARIN SODIUM (PORCINE) 5000 UNIT/ML IJ SOLN: 5000.0000 [IU] | Freq: Three times a day (TID) | INTRAMUSCULAR | Status: DC

## 2021-07-07 MED ORDER — ACETAMINOPHEN 650 MG RE SUPP: 650.0000 mg | Freq: Four times a day (QID) | RECTAL | Status: DC | PRN

## 2021-07-07 NOTE — Assessment & Plan Note (Signed)
Stable

## 2021-07-07 NOTE — Progress Notes (Signed)
PHARMACY NOTE:  ANTIMICROBIAL RENAL DOSAGE ADJUSTMENT  Current antimicrobial regimen includes a mismatch between antimicrobial dosage and estimated renal function.  As per policy approved by the Pharmacy & Therapeutics and Medical Executive Committees, the antimicrobial dosage will be adjusted accordingly.  Current antimicrobial dosage: cefazolin 1g q8h  Indication: post procedure  Renal Function:  Estimated Creatinine Clearance: 12.9 mL/min (A) (by C-G formula based on SCr of 4.81 mg/dL (H)).    Antimicrobial dosage has been changed to:  cefazolin 1g q12h  Additional comments:   Thank you for allowing pharmacy to be a part of this patient's care.  Cristela Felt, PharmD, BCPS Clinical Pharmacist 07/07/2021 5:42 PM

## 2021-07-07 NOTE — Assessment & Plan Note (Signed)
LVEF 20-25% in 02/2021. Will need to hold diuretics in the face of his ARF. Pt may need palliative care team to see him and his wife again to review goals of care.

## 2021-07-07 NOTE — H&P (Signed)
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  Wife states over the last 3 to 4 days, she has noticed increasing swelling of his lower extremities.  Wife states that she took the patient to see the primary care physician last week.  She mentioned the patient's swelling to the doctor.  She states that the primary care doctor was not concerned about a swelling.  Over the weekend, not wife noticed the patient urinating last.  She states he had at least 1 or 2 days of a dry adult diaper.  She states that this is not normal for him as the patient does drink a lot of water.  She was concerned more about the increasing edema.  She states the patient was complaining of pain in his legs due to the swelling.  Patient was brought to the ER for evaluation.  On arrival, patient noted to be tachycardic with a heart rate of 107 blood pressure 109/78 satting 98% on room air.  On exam by the ER provider, patient noted to have priapism.  Patient was unable to urinate.  EDP was able to aspirate the corpus cavernosum with approximately 40 cc of bloody fluid aspirated.  Foley catheter was placed.  There was bloody urine drainage.  Laboratory evaluation showed a BNP of greater than 4500.  Hemoglobin was elevated 18 g/dL.  Chemistry sodium 133, potassium 5.4, bicarbonate 19, BUN of 132, creatinine 4.8.  Reportedly patient heme-negative.  COVID test was negative.  Chest x-ray demonstrated bilateral pleural effusions right greater than  left.  Patient's mentation is altered and is unable to give any history or review of systems.  History is obtained with the patient's wife.  Due to the patient's acute renal failure on top of chronic kidney disease and worsening pleural effusions, [facility] hospital[facility] contacted for admission.   ED Course: pt noted to  have priapism. Pt had penile vein drained. Foley catheter placed.  Labs showed worsening ARF.  Review of Systems:  Review of Systems  Unable to perform ROS: Mental status change   Past Medical History:  Diagnosis Date   Allergy    Atrial fibrillation ([place])    Cataract    CHF (congestive heart failure) ([place])    Chronic kidney disease    Renal Insuffiecny   Colon polyps 2012   Colonoscopy   Diabetes mellitus    type 2   Diverticulosis 2012   Colonoscopy    Hyperglycemia 04/2019   Hyperlipidemia    Hypertension    LBBB (left bundle branch block)    Pneumonia 2015   3 times   Stroke [facility]) 2015    Past Surgical History:  Procedure Laterality Date   AMPUTATION Right 04/28/2016   Procedure: Right Transmetatarsal Amputation;  Surgeon: Newt Minion, MD;  Location: [facility];  Service: Orthopedics;  Laterality: Right;   AMPUTATION TOE Right    CATARACT EXTRACTION Bilateral    both eyes   COLON SURGERY     to repair intestines as  child   COLONOSCOPY  10/20/2011   Procedure: COLONOSCOPY;  Surgeon: Inda Castle, MD;  Location: [facility] ENDOSCOPY;  Service: Endoscopy;  Laterality: N/A;   FEMORAL-POPLITEAL BYPASS GRAFT Right 04/07/2016   Procedure: BYPASS GRAFT FEMORAL-POPLITEAL ARTERY-RIGHT;  Surgeon: Angelia Mould, MD;  [facility];  Service: Vascular;  Laterality: Right;   HOT HEMOSTASIS  10/20/2011   Procedure: HOT HEMOSTASIS (ARGON PLASMA COAGULATION/BICAP);  Surgeon: Inda Castle, MD;  Location: [facility];  Service: Endoscopy;  Laterality: N/A;   KNEE ARTHROSCOPY Left    left   NECK SURGERY     disectomy   PERIPHERAL VASCULAR CATHETERIZATION Right  04/05/2016   Procedure: Lower Extremity Angiography;  Surgeon: Rosetta Posner, MD;  Location: [facility];  Service: Cardiovascular;  Laterality: Right;   PERIPHERAL VASCULAR CATHETERIZATION N/A 04/05/2016   Procedure: Abdominal Aortogram;  Surgeon: Rosetta Posner, MD;  Location: [facility];  Service: Cardiovascular;  Laterality: N/A;   RIGHT/LEFT HEART CATH AND CORONARY ANGIOGRAPHY N/A 02/07/2019   Procedure: RIGHT/LEFT HEART CATH AND CORONARY ANGIOGRAPHY;  Surgeon: Jolaine Artist, MD;  Location: [facility] CV LAB;  Service: Cardiovascular;  Laterality: N/A;   VEIN HARVEST Right 04/07/2016   Procedure: VEIN HARVEST GREATER SAPHENOUS VIEN ;  Surgeon: Angelia Mould, MD;  Location: [facility];  Service: Vascular;  Laterality: Right;     reports that he has been smoking cigarettes. He has a 37.50 pack-year smoking history. He has never used smokeless tobacco. He reports that he does not currently use alcohol. He reports that he does not use drugs.  Allergies  Allergen Reactions   Bee Venom Itching, Swelling and Other (See Comments)    Dizziness      Family History  Problem Relation Age of Onset   Diabetes Mellitus II Mother    Heart disease Brother        before age 44   Colon cancer Neg Hx    Esophageal cancer Neg Hx    Stomach cancer Neg Hx    Anesthesia problems Neg Hx    Hypotension Neg Hx    Malignant hyperthermia Neg Hx    Pseudochol deficiency Neg Hx     Prior to Admission medications   Medication Sig Start Date End Date Taking? Authorizing Provider  aspirin 81 MG EC tablet Take 81 mg by mouth daily.   Yes [provider]  clopidogrel (PLAVIX) 75 MG tablet Take 75 mg by mouth daily.   Yes [provider]  ferrous sulfate 325 (65 FE) MG tablet Take 325 mg by mouth daily. 05/15/21  Yes [provider]  insulin aspart protamine- aspart (NOVOLOG MIX 70/30) (70-30) 100 UNIT/ML injection Inject 0-16 Units into the skin 2 (two) times daily  with a meal. Sliding scale   Yes [provider]  Insulin Pen Needle 32G X 4 MM MISC Use as directed daily at bedtime. 05/23/19  Yes Hongalgi, Lenis Dickinson, MD  midodrine (PROAMATINE) 5 MG tablet Take 1 tablet (5 mg total) by mouth 3 (three) times daily with meals. 04/27/21  Yes Bensimhon, Shaune Pascal, MD  Multiple Vitamin (MULTIVITAMIN WITH MINERALS) TABS tablet Take 1 tablet by mouth daily. 05/23/19  Yes Hongalgi, Lenis Dickinson, MD  [facility] VERIO test strip Check blood sugar twice daily 02/01/21  Yes [provider]  simvastatin (ZOCOR) 40 MG tablet Take 40 mg by mouth daily.   Yes [provider]  torsemide (DEMADEX) 20 MG tablet Take 2 tablets (40 mg  total) by mouth daily. 03/04/21  Yes Bensimhon, Shaune Pascal, MD  vitamin C (ASCORBIC ACID) 500 MG tablet Take 500 mg by mouth daily.   Yes [provider]  iron polysaccharides (FERREX 150) 150 MG capsule Take 1 capsule (150 mg total) by mouth daily. Patient not taking: Reported on 07/07/2021 05/23/19   Modena Jansky, MD    Physical Exam: Vitals:   07/07/21 0215 07/07/21 0245 07/07/21 0300 07/07/21 0330  BP: 99/70 98/73 97/75  103/73  Pulse: (!) 103 (!) 101 (!) 102 (!) 102  Resp: (!) 24 12 10 12   SpO2: 99% 99% 100% 100%    Physical Exam Vitals and nursing note reviewed.  Constitutional:      Comments: Chronically ill appearing male  HENT:     Head: Normocephalic.  Cardiovascular:     Rate and Rhythm: Regular rhythm. Tachycardia present.     Heart sounds: Murmur heard.  Pulmonary:     Breath sounds: Examination of the right-lower field reveals rhonchi. Examination of the left-lower field reveals rhonchi. Rhonchi present.  Abdominal:     General: Bowel sounds are normal. There is no distension.     Tenderness: There is no abdominal tenderness.  Musculoskeletal:     Right upper leg: Swelling present.     Left upper leg: Swelling present.     Right lower leg: 1+ Edema present.     Left lower leg: 1+ Edema present.      Comments: Right transmetatarsal amputation. Ischemic wounds to right foot stump  +1-2 pitting LE edema from ankle to mid thighs bilaterally  Skin:    Capillary Refill: Capillary refill takes less than 2 seconds.     Findings: Lesion present.  Neurological:     Comments: Not arousable     Labs on Admission: I have personally reviewed following labs and imaging studies  CBC: Recent Labs  Lab 07/07/21 0005  WBC 9.5  NEUTROABS 8.1*  HGB 18.0*  HCT 50.7  MCV 89.9  PLT 63*   Basic Metabolic Panel: Recent Labs  Lab 07/07/21 0005  NA 133*  K 5.4*  CL 96*  CO2 19*  GLUCOSE 145*  BUN 132*  CREATININE 4.81*  CALCIUM 8.9   GFR: CrCl cannot be calculated (Unknown ideal weight.). Liver Function Tests: Recent Labs  Lab 07/07/21 0005  AST 68*  ALT 50*  ALKPHOS 149*  BILITOT 2.8*  PROT 5.8*  ALBUMIN 2.6*   No results for input(s): LIPASE, AMYLASE in the last 168 hours. No results for input(s): AMMONIA in the last 168 hours. Coagulation Profile: No results for input(s): INR, PROTIME in the last 168 hours. Cardiac Enzymes: No results for input(s): CKTOTAL, CKMB, CKMBINDEX, TROPONINI in the last 168 hours. BNP (last 3 results) No results for input(s): PROBNP in the last 8760 hours. HbA1C: No results for input(s): HGBA1C in the last 72 hours. CBG: No results for input(s): GLUCAP in the last 168 hours. Lipid Profile: No results for input(s): CHOL, HDL, LDLCALC, TRIG, CHOLHDL, LDLDIRECT in the last 72 hours. Thyroid Function Tests: No results for input(s): TSH, T4TOTAL, FREET4, T3FREE, THYROIDAB in the last 72 hours. Anemia Panel: No results for input(s): VITAMINB12, FOLATE, FERRITIN, TIBC, IRON, RETICCTPCT in the last 72 hours. Urine analysis:    Component Value Date/Time   COLORURINE AMBER (A) 07/07/2021 0424   APPEARANCEUR HAZY (A) 07/07/2021 0424   LABSPEC 1.012 07/07/2021 0424   PHURINE 5.0 07/07/2021 0424   GLUCOSEU NEGATIVE 07/07/2021 0424   HGBUR  NEGATIVE 07/07/2021  [place] 07/07/2021 0424   KETONESUR NEGATIVE 07/07/2021 0424   PROTEINUR 30 (A) 07/07/2021 0424   UROBILINOGEN 0.2 01/23/2014 1600   NITRITE NEGATIVE 07/07/2021 0424   LEUKOCYTESUR TRACE (A) 07/07/2021 0424    Radiological Exams on Admission: I have personally reviewed images DG Chest Port 1 View  Result Date: 07/07/2021 CLINICAL DATA:  Shortness of breath EXAM: PORTABLE CHEST 1 VIEW COMPARISON:  01/18/2021 FINDINGS: Shallow inspiration with elevation of the right hemidiaphragm. Probable small bilateral pleural effusions. Cardiac enlargement. Bilateral perihilar infiltrates, greater on the right, likely edema. No pneumothorax. IMPRESSION: Cardiac enlargement with bilateral pleural effusions. Perihilar infiltration is likely edema. Electronically Signed   By: Lucienne Capers M.D.   On: 07/07/2021 00:32    EKG: I have personally reviewed EKG: sinus tachycardia    Assessment/Plan Principal Problem:   Acute renal failure superimposed on stage 4 chronic kidney disease (HCC) Active Problems:   Pleural effusion on right   Biventricular heart failure (HCC)   Essential hypertension   Chronic systolic CHF (congestive heart failure) (HCC)   DM (diabetes mellitus), type 2 with renal complications (HCC)   Failure to thrive in adult   DNR (do not resuscitate)/DNI(Do Not Intubate)    Acute renal failure superimposed on stage 4 chronic kidney disease ([place]) Admit to med bed. Foley catheter placed. ARF could be due to obstructive from his priapism. Now that priapism has been drained, continue with foley and follow Scr. Verified with pt's wife that pt would not want dialysis. Wife affirms that pt is a DNR/DNI.  Pleural effusion on right Pt with pleural effusion on the right side based on CXR. If pt's respiratory status becomes an issue, consider thoracentesis as diuresis with pt with CKD stage 4-5 likely to be ineffective in removing enough volume that  pleural effusion will shrink.  Biventricular heart failure (HCC) LVEF 20-25% in 02/2021. Will need to hold diuretics in the face of his ARF. Pt may need palliative care team to see him and his wife again to review goals of care.  Essential hypertension Stable.  Chronic systolic CHF (congestive heart failure) (HCC) Pt has LE edema. May need advanced CHF team to assist with diuresis(if possible) given pt's advanced renal failure.  Failure to thrive in adult Chronic.  DNR (do not resuscitate)/DNI(Do Not Intubate) Verified pt's DNR/DNI status with wife.  DM (diabetes mellitus), type 2 with renal complications (HCC) Add SSI.  DVT prophylaxis: SQ Heparin Code Status: DNR/DNI(Do NOT Intubate)verified with wife Family Communication: discussed with pt's wife at bedside  Disposition Plan: return home vs hospice  Consults called: none  Admission status: Inpatient, Med-Surg   Kristopher Oppenheim, DO [facility] 07/07/2021, 5:15 AM

## 2021-07-07 NOTE — Assessment & Plan Note (Signed)
Add SSI. ?

## 2021-07-07 NOTE — Assessment & Plan Note (Signed)
Chronic. 

## 2021-07-07 NOTE — Consult Note (Signed)
Consultation Note Date: 07/07/2021   Patient Name: [name]  DOB: 05-Jan-1941  MRN: [id number]  Age / Sex: 80 y.o., male  PCP: Prince Solian, MD Referring Physician: Thurnell Lose, MD  Reason for Consultation: Establishing goals of care  HPI/Patient Profile: 80 y.o. 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   ED workup revealed priapism, bilateral pleural effusions right greater than left, acute renal failure on top of chronic kidney disease. PMT has been consulted to assist with goals of care conversation.   Clinical Assessment and Goals of Care:  I have reviewed medical records including EPIC notes, labs and imaging, received report from RN, assessed the patient and then met at the bedside along with patient's sisters Tye Maryland and Stanton Kidney, then met with patient's wife [name], to discuss diagnosis prognosis, GOC, EOL wishes, disposition and options.  I introduced Palliative Medicine as specialized medical care for people living with serious illness. It focuses on providing relief from the symptoms and stress of a serious illness. The goal is to improve quality of life for both the patient and the family.  We discussed a brief life review of the patient and then focused on their current illness. Patient's family shares that he previously worked as a Administrator for around 30 years. He became widowed after his first marriage and now has been married to [place] for many years, who was also a widow previously. She is his caregiver 24/7 and laments that he is her only companion at home. She has not yet informed her son of patient's  current illness. She notes that her sister's husband had a similar presentation with leg swelling and decreased kidney function which he was then able to recover from after several months. I empathized with [name] and shared that while I wish for Sarp's recovery, I am very worried that he appears to be at end of life and suffering with pain and discomfort. We discussed recommendations for comfort care given Carole's preference to decline dialysis. Reviewed the immense difficulty in treating his end stage heart failure effectively with concurrent and acutely worsening renal failure. I emphasized the importance of prioritizing patient's wishes if were able to speak for himself. Patient's sisters feel he is currently suffering and would wish to be comfortable.  The natural disease trajectory and expectations at EOL were discussed. [name] feels strongly about her decision to continue attempting medical management at this time. She states that if the patient were to pass away despite ongoing treatments, this would be accepted as God's will. I shared that there are options to prevent discomfort and promote peace/dignity as the patient's natural disease process progresses. I shared my recommendation for a transition to comfort-focused care given that treatments will likely prolong his suffering rather than provide  benefit.  I attempted to elicit values and goals of care important to the patient.   [name] wishes to take patient home with continued home health services. She understands that hospice services would provide more support but she is not ready to consider end of life care or hospice philosophy.  The difference between aggressive medical intervention and comfort care was considered in light of the patient's goals of care.   Advanced directives, concepts specific to code status, artifical feeding and hydration, and rehospitalization were considered and discussed.  Hospice and Palliative Care services  outpatient were explained and offered.  Discussed the importance of continued conversation with family and the medical providers regarding overall plan of care and treatment options, ensuring decisions are within the context of the patient's values and GOCs.    Questions and concerns were addressed.  Hard Choices booklet left for review. The family was encouraged to call with questions or concerns.  PMT will continue to support holistically.    NEXT OF KIN is patient's wife [name]. No HCPOA on file.    SUMMARY OF RECOMMENDATIONS   -DNR -Continue supportive care -Patient's wife is not ready for comfort-focused care, would prefer HH/OP PC -Psychosocial and emotional support provided -Ongoing support from PMT  Additional Recommendations (Limitations, Scope, Preferences): No Hemodialysis  Psycho-social/Spiritual:  Desire for further Chaplaincy support: Declined today Additional Recommendations: Education on Hospice and Grief/Bereavement Support  Prognosis:  Poor prognosis given end stage biventricular heart failure, AKI on CKD4, failure to thrive including progressive decline in functional/nutritional status, cognitive decline, and multiple other comorbidities   Discharge Planning: To Be Determined      Primary Diagnoses: Present on Admission:  Chronic systolic CHF (congestive heart failure) (HCC)  Essential hypertension  Failure to thrive in adult  DM (diabetes mellitus), type 2 with renal complications ([place])  Biventricular heart failure ([place])   I have reviewed the medical record, interviewed the patient and family, and examined the patient. The following aspects are pertinent.  Past Medical History:  Diagnosis Date   Allergy    Atrial fibrillation ([place])    Cataract    CHF (congestive heart failure) ([place])    Chronic kidney disease    Renal Insuffiecny   Colon polyps 2012   Colonoscopy   Diabetes mellitus    type 2   Diverticulosis 2012   Colonoscopy     Hyperglycemia 04/2019   Hyperlipidemia    Hypertension    LBBB (left bundle branch block)    Pneumonia 2015   3 times   Stroke [facility]) 2015   Social History   Socioeconomic History   Marital status: Married    Spouse name: Koven Belinsky    Number of children: 3   Years of education: Not on file   Highest education level: Not on file  Occupational History   Occupation: Retired  Tobacco Use   Smoking status: Every Day    Packs/day: 0.75    Years: 50.00    Pack years: 37.50    Types: Cigarettes   Smokeless tobacco: Never   Tobacco comments:    4 cigs/day currently  Vaping Use   Vaping Use: Never used  Substance and Sexual Activity   Alcohol use: Not Currently    Comment: rarely   Drug use: No   Sexual activity: Not on file  Other Topics Concern   Not on file  Social History Narrative   Not on file   Social Determinants of Health   Financial Resource Strain: Low  Risk    Difficulty of Paying Living Expenses: Not very hard  Food Insecurity: No Food Insecurity   Worried About Running Out of Food in the Last Year: Never true   Ran Out of Food in the Last Year: Never true  Transportation Needs: No Transportation Needs   Lack of Transportation (Medical): No   Lack of Transportation (Non-Medical): No  Physical Activity: Not on file  Stress: Not on file  Social Connections: Not on file   Family History  Problem Relation Age of Onset   Diabetes Mellitus II Mother    Heart disease Brother        before age 83   Colon cancer Neg Hx    Esophageal cancer Neg Hx    Stomach cancer Neg Hx    Anesthesia problems Neg Hx    Hypotension Neg Hx    Malignant hyperthermia Neg Hx    Pseudochol deficiency Neg Hx    Scheduled Meds:  aspirin EC  81 mg Oral Daily   clopidogrel  75 mg Oral Daily   insulin aspart  0-9 Units Subcutaneous QID   neomycin-bacitracin-polymyxin   Topical TID   simvastatin  40 mg Oral Daily   sodium zirconium cyclosilicate  10 g Oral TID   Continuous  Infusions:   ceFAZolin (ANCEF) IV Stopped (07/07/21 0622)   PRN Meds:.acetaminophen **OR** acetaminophen, traMADol Medications Prior to Admission:  Prior to Admission medications   Medication Sig Start Date End Date Taking? Authorizing Provider  aspirin 81 MG EC tablet Take 81 mg by mouth daily.   Yes [provider]  clopidogrel (PLAVIX) 75 MG tablet Take 75 mg by mouth daily.   Yes [provider]  ferrous sulfate 325 (65 FE) MG tablet Take 325 mg by mouth daily. 05/15/21  Yes [provider]  insulin aspart protamine- aspart (NOVOLOG MIX 70/30) (70-30) 100 UNIT/ML injection Inject 0-16 Units into the skin 2 (two) times daily with a meal. Sliding scale   Yes [provider]  Insulin Pen Needle 32G X 4 MM MISC Use as directed daily at bedtime. 05/23/19  Yes Hongalgi, Lenis Dickinson, MD  midodrine (PROAMATINE) 5 MG tablet Take 1 tablet (5 mg total) by mouth 3 (three) times daily with meals. 04/27/21  Yes Bensimhon, Shaune Pascal, MD  Multiple Vitamin (MULTIVITAMIN WITH MINERALS) TABS tablet Take 1 tablet by mouth daily. 05/23/19  Yes Hongalgi, Lenis Dickinson, MD  [facility] VERIO test strip Check blood sugar twice daily 02/01/21  Yes [provider]  simvastatin (ZOCOR) 40 MG tablet Take 40 mg by mouth daily.   Yes [provider]  torsemide (DEMADEX) 20 MG tablet Take 2 tablets (40 mg total) by mouth daily. 03/04/21  Yes Bensimhon, Shaune Pascal, MD  vitamin C (ASCORBIC ACID) 500 MG tablet Take 500 mg by mouth daily.   Yes [provider]  iron polysaccharides (FERREX 150) 150 MG capsule Take 1 capsule (150 mg total) by mouth daily. Patient not taking: Reported on 07/07/2021 05/23/19   Modena Jansky, MD   Allergies  Allergen Reactions   Bee Venom Itching, Swelling and Other (See Comments)    Dizziness     Review of Systems  Unable to perform ROS: Mental status change   Physical Exam Vitals and nursing note reviewed.  Constitutional:      Appearance:  He is ill-appearing.  Cardiovascular:     Rate and Rhythm: Tachycardia present.  Pulmonary:     Effort: Pulmonary effort is normal.  Musculoskeletal:  Right lower leg: Edema present.     Left lower leg: Edema present.  Neurological:     Mental Status: He is lethargic and disoriented.    Vital Signs: BP 106/70   Pulse (!) 101   Resp 12   SpO2 100%  Pain Scale: 0-10   Pain Score: 10-Worst pain ever   SpO2: SpO2: 100 % O2 Device:SpO2: 100 % O2 Flow Rate: .   IO: Intake/output summary:  Intake/Output Summary (Last 24 hours) at 07/07/2021 1243 Last data filed at 07/07/2021 1686 Gross per 24 hour  Intake 50 ml  Output 300 ml  Net -250 ml    LBM:   Baseline Weight:   Most recent weight:       Palliative Assessment/Data: 10%     Time In: 11:30am Time Out: 12:40pm Time Total: 70 minutes Greater than 50% of this time was spent in counseling and coordinating care related to the above assessment and plan.  Dorthy Cooler, PA-C Palliative Medicine Team Team phone # [phone]  Thank you for allowing the Palliative Medicine Team to assist in the care of this patient. Please utilize secure chat with additional questions, if there is no response within 30 minutes please call the above phone number.  Palliative Medicine Team providers are available by phone from 7am to 7pm daily and can be reached through the team cell phone.  Should this patient require assistance outside of these hours, please call the patient's attending physician.

## 2021-07-07 NOTE — Progress Notes (Signed)
Responded to request to pray with patient.  Upon my arrival family member (sister) said she was [place] Witness and had already prayed with and for patient.Marland Kitchen Respected family preference. Provided emotional support and ministry of presence. Will follow as needed.  Jaclynn Major, [place], [facility], Pager [phone]

## 2021-07-07 NOTE — Assessment & Plan Note (Signed)
Pt with pleural effusion on the right side based on CXR. If pt's respiratory status becomes an issue, consider thoracentesis as diuresis with pt with CKD stage 4-5 likely to be ineffective in removing enough volume that pleural effusion will shrink.

## 2021-07-07 NOTE — Evaluation (Signed)
Physical Therapy Evaluation Patient Details Name: [name] MRN: [id number] DOB: 20-Dec-1940 Today's Date: 07/07/2021  History of Present Illness  Pt is a 80 y/o male admitted 10/12 secondary to SOB, and BLE. Has been unable to ambulate as well. Thought to be secondary to Acute renal failure. PMH includes CKD, HTN, CHF, R transmet amputation, DM, a fib and CVA.  Clinical Impression  Pt admitted secondary to problem above with deficits below. Pt lethargic, but would awaken with extremity movement. Pt crying out when moving BLE (RLE>LLE). Placed in chair position to assess pt tolerance, however, pt quickly falling asleep. Per RN, palliative consult was placed and awaiting their consult. Pt's wife reports they would like to take the patient home at d/c. Recommending DME below for ease of care. May benefit from [facility] services, but palliative consult pending. Will continue to follow acutely.        Recommendations for follow up therapy are one component of a multi-disciplinary discharge planning process, led by the attending physician.  Recommendations may be updated based on patient status, additional functional criteria and insurance authorization.  Follow Up Recommendations Home health PT;Supervision/Assistance - 24 hour (pending palliative discussion)    Equipment Recommendations  Hospital bed;Other (comment) (hoyer lift with pad)    Recommendations for Other Services       Precautions / Restrictions Precautions Precautions: Fall Restrictions Weight Bearing Restrictions: No      Mobility  Bed Mobility Overal bed mobility: Needs Assistance             General bed mobility comments: Placed pt in chair position in stretcher to evaluate if alertness would improve. Pt opened eyes briefly, but fell back to sleep quickly. Further mobility deferred. BP at 107/77 in chair position.    Transfers                    Ambulation/Gait                Stairs             Wheelchair Mobility    Modified Rankin (Stroke Patients Only)       Balance                                             Pertinent Vitals/Pain Pain Assessment: Faces Faces Pain Scale: Hurts whole lot Pain Location: BLE with movement (RLE>LLE) Pain Descriptors / Indicators: Guarding;Grimacing;Moaning Pain Intervention(s): Limited activity within patient's tolerance;Monitored during session;Repositioned    Home Living Family/patient expects to be discharged to:: Private residence Living Arrangements: Spouse/significant other Available Help at Discharge: Family;Available 24 hours/day Type of Home: House Home Access: Level entry     Home Layout: Two level;Able to live on main level with bedroom/bathroom Home Equipment: Gilford Rile - 2 wheels;Cane - single point;Wheelchair - manual;Bedside commode      Prior Function Level of Independence: Needs assistance   Gait / Transfers Assistance Needed: Pt's daughters report pt has had progressive decline with mobility the past 4 months and has not been wanting to get up. Per wife, pt was able to ambulate prior to 2 days ago, but in the past two days has had to use WC  ADL's / Homemaking Assistance Needed: Wife has to assist with ADLs.        Hand Dominance        Extremity/Trunk Assessment   Upper  Extremity Assessment Upper Extremity Assessment: Defer to OT evaluation    Lower Extremity Assessment Lower Extremity Assessment: Generalized weakness;RLE deficits/detail;LLE deficits/detail RLE Deficits / Details: RLE positioned in ER at hip and knee flexion. Very limited ROM secondary to pain. LLE Deficits / Details: Increased swelling noted and very limited ROM secondary to pain       Communication   Communication: Expressive difficulties  Cognition Arousal/Alertness: Lethargic Behavior During Therapy: Flat affect Overall Cognitive Status: Difficult to assess                                  General Comments: Pt awakens when BLE moved secondary to pain. Otherwise falls back to sleep quickly.      General Comments General comments (skin integrity, edema, etc.): Had discussion with daughters and wife about DME recommendations and were agreeable.    Exercises     Assessment/Plan    PT Assessment Patient needs continued PT services  PT Problem List Decreased strength;Decreased range of motion;Decreased activity tolerance;Decreased balance;Decreased mobility;Decreased cognition;Decreased coordination;Decreased knowledge of use of DME;Decreased safety awareness;Decreased knowledge of precautions;Pain       PT Treatment Interventions DME instruction;Functional mobility training;Therapeutic activities;Therapeutic exercise;Balance training;Patient/family education    PT Goals (Current goals can be found in the Care Plan section)  Acute Rehab PT Goals Patient Stated Goal: for pt to be comfortable and return home PT Goal Formulation: With family Time For Goal Achievement: 07/21/21 Potential to Achieve Goals: Fair    Frequency Min 2X/week   Barriers to discharge        Co-evaluation               AM-PAC PT "6 Clicks" Mobility  Outcome Measure Help needed turning from your back to your side while in a flat bed without using bedrails?: Total Help needed moving from lying on your back to sitting on the side of a flat bed without using bedrails?: Total Help needed moving to and from a bed to a chair (including a wheelchair)?: Total Help needed standing up from a chair using your arms (e.g., wheelchair or bedside chair)?: Total Help needed to walk in hospital room?: Total Help needed climbing 3-5 steps with a railing? : Total 6 Click Score: 6    End of Session   Activity Tolerance: Patient limited by lethargy;Patient limited by fatigue Patient left: in bed;with call bell/phone within reach (on stretcher in ED in chair position) Nurse Communication: Mobility  status PT Visit Diagnosis: Other abnormalities of gait and mobility (R26.89);Difficulty in walking, not elsewhere classified (R26.2)    Time: 3244-0102 PT Time Calculation (min) (ACUTE ONLY): 14 min   Charges:   PT Evaluation $PT Eval Moderate Complexity: 1 Mod          Reuel Derby, PT, DPT  Acute Rehabilitation Services  Pager: [phone] Office: [phone]   Rudean Hitt 07/07/2021, 11:28 AM

## 2021-07-07 NOTE — Subjective & Objective (Signed)
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  Wife states over the last 3 to 4 days, she has noticed increasing swelling of his lower extremities.  Wife states that she took the patient to see the primary care physician last week.  She mentioned the patient's swelling to the doctor.  She states that the primary care doctor was not concerned about a swelling.  Over the weekend, not wife noticed the patient urinating last.  She states he had at least 1 or 2 days of a dry adult diaper.  She states that this is not normal for him as the patient does drink a lot of water.  She was concerned more about the increasing edema.  She states the patient was complaining of pain in his legs due to the swelling.  Patient was brought to the ER for evaluation.  On arrival, patient noted to be tachycardic with a heart rate of 107 blood pressure 109/78 satting 98% on room air.  On exam by the ER provider, patient noted to have priapism.  Patient was unable to urinate.  EDP was able to aspirate the corpus cavernosum with approximately 40 cc of bloody fluid aspirated.  Foley catheter was placed.  There was bloody urine drainage.  Laboratory evaluation showed a BNP of greater than 4500.  Hemoglobin was elevated 18 g/dL.  Chemistry sodium 133, potassium 5.4, bicarbonate 19, BUN of 132, creatinine 4.8.  Reportedly patient heme-negative.  COVID test was negative.  Chest x-ray demonstrated bilateral pleural effusions right greater than left.  Patient's mentation is altered and is unable to give any history or review of systems.  History is obtained with the patient's wife.  Due to the patient's acute renal failure on top of chronic kidney disease and worsening pleural  effusions, [facility] hospitalist contacted for admission.

## 2021-07-07 NOTE — Assessment & Plan Note (Addendum)
Admit to med bed. Foley catheter placed. ARF could be due to obstructive from his priapism. Now that priapism has been drained, continue with foley and follow Scr. Verified with pt's wife that pt would not want dialysis. Wife affirms that pt is a DNR/DNI.

## 2021-07-07 NOTE — ED Provider Notes (Addendum)
[facility] EMERGENCY DEPARTMENT Provider Note   CSN: [id number] Arrival date & time: 07/24/2021  2248     History Chief Complaint  Patient presents with  . Shortness of Breath  . Leg Swelling    [name] is a 80 y.o. male.  HPI  80 year old male with past medical history of HTN, HLD, atrial fibrillation on aspirin and Plavix, chronic LBBB, CKD, CHF, DM, right midfoot amputation presents the emergency department with multiple complaints.  Patient is a poor historian, accompanied by family member at bedside.  She is her primary caregiver.  Patient does see a palliative doctor.  Patient and family member endorse bilateral leg swelling and pain for the past couple days, not improved with elevation.  He has a chronic cough at baseline that has gotten worse and now associated with shortness of breath.  Patient also has not voided any urine in the last 24 hours.  This is unusual for the patient.  He denies any acute suprapubic abdominal pain.  Denies any back/flank pain.  No fever, vomiting or diarrhea.  Past Medical History:  Diagnosis Date  . Allergy   . Atrial fibrillation ([place])   . Cataract   . CHF (congestive heart failure) ([place])   . Chronic kidney disease    Renal Insuffiecny  . Colon polyps 2012   Colonoscopy  . Diabetes mellitus    type 2  . Diverticulosis 2012   Colonoscopy   . Hyperglycemia 04/2019  . Hyperlipidemia   . Hypertension   . LBBB (left bundle branch block)   . Pneumonia 2015   3 times  . Stroke [facility]) 2015    Patient Active Problem List   Diagnosis Date Noted  . Acute congestive heart failure ([place]) 01/19/2021  . NSTEMI (non-ST elevated myocardial infarction) ([place]) 01/19/2021  . Failure to thrive in adult 01/19/2021  . Biventricular heart failure ([place]) 09/24/2019  . Protein-calorie malnutrition, severe 05/23/2019  . Hyperosmolar non-ketotic state in patient with type 2 diabetes mellitus ([place]) 05/22/2019  . Uncontrolled diabetes  mellitus with hyperglycemia ([place]) 05/21/2019  . Hyperkalemia 05/21/2019  . Acute renal failure superimposed on stage 3 chronic kidney disease ([place]) 05/21/2019  . Prolonged QT interval 05/21/2019  . Acute respiratory failure with hypoxia ([place]) 04/01/2019  . 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 Goals of care, counseling/discussion 04/01/2019  . Shingles outbreak 04/01/2019  . Community acquired pneumonia of left lower lobe of lung 02/05/2019  . Stage 4 chronic kidney disease ([place]) 02/05/2019  . Acute CHF (congestive heart failure) ([place]) 02/05/2019  . Acute on chronic congestive heart failure ([place]) 02/04/2019  . Aftercare following surgery of the circulatory system 08/09/2016  . Status post transmetatarsal amputation of foot, right ([place]) 08/03/2016  . 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 Cellulitis and abscess of foot 03/23/2016  . Diabetic infection of right foot ([place]) 03/23/2016  . Puncture wound of foot, right, complicated 96/78/9381  . Malnutrition of moderate degree 03/23/2016  . AKI (acute kidney injury) ([place])   . Tobacco use disorder 02/28/2016  . Acute CVA (cerebrovascular accident) ([place]) 02/27/2016  . DM (diabetes mellitus), type 2 with renal complications ([place]) 01/75/1025  . Anemia 02/27/2016  . Chronic systolic CHF (congestive heart failure) ([place]) 05/05/2015  . 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 Essential hypertension  07/16/2014  . Former smoker 04/21/2014  . Thrombotic stroke ([place]) 01/23/2014  . Dizziness 01/23/2014  . Facial droop 01/23/2014  . Benign neoplasm of colon 08/25/2011  . CAD, NATIVE VESSEL 05/23/2010  . DIABETIC  RETINOPATHY 03/21/2010  . Hyperlipidemia 03/21/2010  . NEUROPATHY 03/21/2010  . Primary hypertension 03/21/2010   . LBBB (left bundle branch block) 03/21/2010  . 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 CERVICAL RADICULOPATHY 03/21/2010    Past Surgical History:  Procedure Laterality Date  . AMPUTATION Right 04/28/2016   Procedure: Right Transmetatarsal Amputation;  Surgeon: Newt Minion, MD;  Location: [facility];  Service: Orthopedics;  Laterality: Right;  . AMPUTATION TOE Right   . CATARACT EXTRACTION Bilateral    both eyes  . COLON SURGERY     to repair intestines as child  . COLONOSCOPY  10/20/2011   Procedure: COLONOSCOPY;  Surgeon: Inda Castle, MD;  Location: [facility];  Service: Endoscopy;  Laterality: N/A;  . FEMORAL-POPLITEAL BYPASS GRAFT Right 04/07/2016   Procedure: BYPASS GRAFT FEMORAL-POPLITEAL ARTERY-RIGHT;  Surgeon: Angelia Mould, MD;  Location: [facility];  Service: Vascular;  Laterality: Right;  . HOT HEMOSTASIS  10/20/2011   Procedure: HOT HEMOSTASIS (ARGON PLASMA COAGULATION/BICAP);  Surgeon: Inda Castle, MD;  Location: [facility];  Service: Endoscopy;  Laterality: N/A;  . KNEE ARTHROSCOPY Left    left  . NECK SURGERY     disectomy  . PERIPHERAL VASCULAR CATHETERIZATION Right 04/05/2016   Procedure: Lower Extremity Angiography;  Surgeon: Rosetta Posner, MD;  Location: [facility];  Service: Cardiovascular;  Laterality: Right;  . PERIPHERAL VASCULAR CATHETERIZATION N/A 04/05/2016   Procedure: Abdominal Aortogram;  Surgeon: Rosetta Posner, MD;  Location: [facility];  Service: Cardiovascular;  Laterality: N/A;  . RIGHT/LEFT HEART CATH AND CORONARY ANGIOGRAPHY N/A 02/07/2019   Procedure: RIGHT/LEFT HEART CATH AND CORONARY ANGIOGRAPHY;  Surgeon: Jolaine Artist, MD;  Location: [facility];  Service: Cardiovascular;  Laterality: N/A;  . VEIN HARVEST Right 04/07/2016   Procedure: VEIN HARVEST GREATER SAPHENOUS VIEN ;  Surgeon: Angelia Mould, MD;  Location: [facility] OR;  Service: Vascular;  Laterality: Right;       Family History  Problem Relation Age of Onset   . Diabetes Mellitus II Mother   . Heart disease Brother        before age 80  . Colon cancer Neg Hx   . Esophageal cancer Neg Hx   . Stomach cancer Neg Hx   . Anesthesia problems Neg Hx   . Hypotension Neg Hx   . Malignant hyperthermia Neg Hx   . Pseudochol deficiency Neg Hx     Social History   Tobacco Use  . Smoking status: Every Day    Packs/day: 0.75    Years: 50.00    Pack years: 37.50    Types: Cigarettes  . Smokeless tobacco: Never  . Tobacco comments:    4 cigs/day currently  Vaping Use  . Vaping Use: Never used  Substance Use Topics  . Alcohol use: Not Currently    Comment: rarely  . Drug use: No    Home Medications Prior to Admission medications   Medication Sig Start Date End Date Taking? Authorizing Provider  aspirin 81 MG EC tablet take one tab by mouth once daily    [provider]  clopidogrel (PLAVIX) 75 MG tablet Take 75 mg by mouth daily.    [provider]  insulin aspart protamine- aspart (NOVOLOG MIX 70/30) (  70-30) 100 UNIT/ML injection Inject 8-16 Units into the skin 2 (two) times daily. Sliding scale    [provider]  insulin isophane & regular human KwikPen (NOVOLIN 70/30 KWIKPEN) (70-30) 100 UNIT/ML KwikPen inject 8 units    [provider]  Insulin Pen Needle 32G X 4 MM MISC Use as directed daily at bedtime. 05/23/19   Hongalgi, Lenis Dickinson, MD  iron polysaccharides (FERREX 150) 150 MG capsule Take 1 capsule (150 mg total) by mouth daily. 05/23/19   Hongalgi, Lenis Dickinson, MD  midodrine (PROAMATINE) 5 MG tablet Take 1 tablet (5 mg total) by mouth 3 (three) times daily with meals. 04/27/21   Bensimhon, Shaune Pascal, MD  Multiple Vitamin (MULTIVITAMIN WITH MINERALS) TABS tablet Take 1 tablet by mouth daily. 05/23/19   Hongalgi, Lenis Dickinson, MD  [facility] VERIO test strip 3 (three) times daily. 02/01/21   [provider]  simvastatin (ZOCOR) 40 MG tablet Take 40 mg by mouth daily.    [provider]  torsemide  (DEMADEX) 20 MG tablet Take 2 tablets (40 mg total) by mouth daily. 03/04/21   Bensimhon, Shaune Pascal, MD  vitamin C (ASCORBIC ACID) 500 MG tablet Take 500 mg by mouth daily.    [provider]    Allergies    Bee venom  Review of Systems   Review of Systems  Constitutional:  Positive for fatigue. Negative for chills and fever.  HENT:  Negative for congestion.   Eyes:  Negative for visual disturbance.  Respiratory:  Positive for cough and shortness of breath.   Cardiovascular:  Positive for leg swelling. Negative for chest pain and palpitations.  Gastrointestinal:  Negative for abdominal pain, diarrhea and vomiting.  Genitourinary:  Positive for difficulty urinating. Negative for flank pain and hematuria.  Musculoskeletal:  Negative for back pain.  Skin:  Negative for rash.  Neurological:  Negative for headaches.   Physical Exam Updated Vital Signs BP 106/66   Pulse (!) 28   Resp (!) 22   SpO2 91%   Physical Exam Vitals and nursing note reviewed.  Constitutional:      Appearance: Normal appearance. He is ill-appearing.  HENT:     Head: Normocephalic.     Mouth/Throat:     Mouth: Mucous membranes are moist.  Cardiovascular:     Rate and Rhythm: Normal rate.  Pulmonary:     Effort: Pulmonary effort is normal. Tachypnea present.     Breath sounds: Rales present.  Abdominal:     Palpations: Abdomen is soft.     Tenderness: There is no abdominal tenderness.  Genitourinary:    Comments: erection Musculoskeletal:     Comments: Right midfoot amputation, both legs are edematous up to the thigh with erythema of both feet, brisk capillary refill of the left toes, palpable DP pulse on the left, no tracking erythema or warmth  Skin:    General: Skin is warm.  Neurological:     Mental Status: He is alert and oriented to person, place, and time. Mental status is at baseline.  Psychiatric:        Mood and Affect: Mood normal.    ED Results / Procedures / Treatments    Labs (all labs ordered are listed, but only abnormal results are displayed) Labs Reviewed  RESP PANEL BY RT-PCR (FLU A&B, COVID) ARPGX2  CBC WITH DIFFERENTIAL/PLATELET  COMPREHENSIVE METABOLIC PANEL  URINALYSIS, ROUTINE W REFLEX MICROSCOPIC  BRAIN NATRIURETIC PEPTIDE  TROPONIN I (HIGH SENSITIVITY)    EKG None  Radiology No results found.  Procedures Irrigate corpus cavern, priapism  Date/Time: 07/07/2021 4:58 AM Performed by: Lorelle Gibbs, DO Authorized by: Lorelle Gibbs, DO  Consent: The procedure was performed in an emergent situation. Verbal consent obtained. Written consent not obtained. Consent given by: spouse Patient identity confirmed: arm band and verbally with patient Preparation: Patient was prepped and draped in the usual sterile fashion. Local anesthesia used: yes Anesthesia: local infiltration  Anesthesia: Local anesthesia used: yes Local Anesthetic: lidocaine 1% with epinephrine Anesthetic total: 1 mL  Sedation: Patient sedated: no  Patient tolerance: patient tolerated the procedure well with no immediate complications Comments: The penis was copiously cleaned. Minimal bloody fluid aspirated, small amount of phenyelphrine injected in right lower location. The penis was re cleaned and a small dressing and light [place] was placed. No active bleeding/swelling.   .Critical Care Performed by: Lorelle Gibbs, DO Authorized by: Lorelle Gibbs, DO   Critical care provider statement:    Critical care time (minutes):  45   Critical care was necessary to treat or prevent imminent or life-threatening deterioration of the following conditions:  Renal failure   Critical care was time spent personally by me on the following activities:  Blood draw for specimens, evaluation of patient's response to treatment, examination of patient, ordering and performing treatments and interventions, ordering and review of laboratory studies, ordering and review of  radiographic studies, re-evaluation of patient's condition and review of old charts   I assumed direction of critical care for this patient from another provider in my specialty: no     Care discussed with: admitting provider     Medications Ordered in ED Medications  morphine 4 MG/ML injection 4 mg (has no administration in time range)    ED Course  I have reviewed the triage vital signs and the nursing notes.  Pertinent labs & imaging results that were available during my care of the patient were reviewed by me and considered in my medical decision making (see chart for details).    MDM Rules/Calculators/A&P                           80 year old male presents emergency department with shortness of breath, leg swelling and inability to urinate for the past 24 hours.  Of note patient does see a palliative doctor for his elevated kidney dysfunction, has made it known that he does not want to proceed with dialysis.  Patient is a poor historian, more confused compared to baseline.  Mainly complaining of pain in his bilateral lower extremities.  Bedside bladder scan shows approximately 400 cc of urine in the bladder, slight tenderness to palpation in the suprapubic area.  Patient has never had this issue before, has an erection on exam. Spouse at bedside says she has not noticed an erection for the past 24 hours. Denies patient being on ED medications. No admitted drug use.   Patient has approximately 3+ pitting edema the bilateral lower extremities, right midfoot has been amputated from a traumatic injury, he has dopplerable DP pulses on the left with normal capillary refill.  Blood work shows worsening kidney dysfunction, patient's confusion is most likely related to uremia, patient is unable to urinate due to erection.  In setting of urinary retention and worsening renal failure, drainage attempted. Spouse at bedside consented verbally, procedure emergent. Understands risks associated. Procedure  performed under sterile condition. Minimal bloody fluid aspirated, small amount of phenylephrine injected in  right lower location. While penis was being dressed, abnormal FB palpated in patients lower testicle. When addressed with the spouse she now admits to a penile prosthesis. Prosthesis manipulated, mild change in erection. Patient still unable to urinate, foley placed. Spouse understands potential damage to prosthesis from procedure.   Chest x-ray shows pulmonary edema, BNP is chronically elevated higher than baseline, troponins are flat and much lower than baseline, no active chest pain.  Now that the patient is draining urine we may consider diuresis.  Patients evaluation and results requires admission for further treatment and care. Patient agrees with admission plan, offers no new complaints and is stable/unchanged at time of admit.  Final Clinical Impression(s) / ED Diagnoses Final diagnoses:  None    Rx / DC Orders ED Discharge Orders     None        Lorelle Gibbs, DO 07/07/21 0509    Lorelle Gibbs, DO 07/07/21 [id number]

## 2021-07-07 NOTE — Progress Notes (Signed)
Attempted bedside swallow screen with sips of water and applesauce. Pt began coughing moderately with each fed amount. Recommend SLP follow-up and maintain NPO.

## 2021-07-07 NOTE — Consult Note (Addendum)
Advanced Heart Failure Team Consult Note   Primary Physician: Prince Solian, MD HF-Cardiologist:  Dr Beatris Si  Nephrology: Dr Justin Mend   Reason for Consultation: End Stage Biventricular Heart Failure   HPI:    [name] is seen today for evaluation of End Stage Biventricular Heart Failure  at the request of Dr Candiss Norse.   [name] is a 80 y.o. male with complicated past medical history: end stage biventricular HF, previous non-obstructive CAD on cath 2011 but with high-grade LM CAD in 5/20, PAD s/p right fem-pop in 2017, DM type 2, HTN, HLD, stroke, CKD stage IV, tobacco abuse, and SDH 01/2021 ( not a surgical candidate).   Over the last few years he has had extensive cardiac work ups. Had cath back in 2020 with hight grade LM disease but was not a candidate for revascularization. Referred to EP for ICD/CRT-D but not felt to be a candidate due to comorbidities.   Most recent ECHO 02/2021 EF 20-25% and reduced RV.   Followed closely in the [facility]. Over the last few months he has declined and was referred to Hospice/Palliative Care. Gold DNR Form completed. He was seen by Dr Haroldine Laws in August and was end stage. At that time he had poor appetite, 10 pound weight loss and was SOB with minimal exertion. Little offer other than oral diuretics.   Presented to [facility] ED with lower extremity edema and leg pain. Over the last few days appetite has decreased. Not drinking or urinating much. No urine output over the last 24 hours. Not able to walk.  Had priapism on arrival . Given phenylephrine  injection. and foley placed. CXR with bilateral pleural effusions and edema. Pertinent labs: BNP > 4500, creatinine 4.8,  BUN 132, Bicarb19, K 5.4, sodium 133. And HS trop 316> 314. Nephrology consulted with recommendations for comfort care.   Complaining of pain all over.  Moaning with any movement.    Review of Systems: [y] = yes, [ ]  = no  Patient is encephalopathic. Therefore  history has been obtained from chart review.   General: Weight gain [ ] ; Weight loss [ ] ; Anorexia [ ] ; Fatigue [ Y]; Fever [ ] ; Chills [ ] ; Weakness [ Y]  Cardiac: Chest pain/pressure [ ] ; Resting SOB [ Y]; Exertional SOB [ Y]; Orthopnea [ Y]; Pedal Edema [ Y]; Palpitations [ ] ; Syncope [ ] ; Presyncope [ ] ; Paroxysmal nocturnal dyspnea[ ]   Pulmonary: Cough [ ] ; Wheezing[ ] ; Hemoptysis[ ] ; Sputum [ ] ; Snoring [ ]   GI: Vomiting[ ] ; Dysphagia[ ] ; Melena[ ] ; Hematochezia [ ] ; Heartburn[ ] ; Abdominal pain [ ] ; Constipation [ ] ; Diarrhea [ ] ; BRBPR [ ]   GU: Hematuria[ ] ; Dysuria [ ] ; Nocturia[ ]   Vascular: Pain in legs with walking [ ] ; Pain in feet with lying flat [ ] ; Non-healing sores [ ] ; Stroke [Y ]; TIA [ ] ; Slurred speech [ ] ;  Neuro: Headaches[ ] ; Vertigo[ ] ; Seizures[ ] ; Paresthesias[ ] ;Blurred vision [ ] ; Diplopia [ ] ; Vision changes [ ]   Ortho/Skin: Arthritis [ Y]; Joint pain [Y]; Muscle pain [ ] ; Joint swelling [ ] ; Back Pain [ Y]; Rash [ ]   Psych: Depression[ ] ; Anxiety[ ]   Heme: Bleeding problems [ ] ; Clotting disorders [ ] ; Anemia [ ]   Endocrine: Diabetes [Y ]; Thyroid dysfunction[ ]   Home Medications Prior to Admission medications   Medication Sig Start Date End Date Taking? Authorizing Provider  aspirin 81 MG EC tablet Take 81 mg by mouth daily.  Yes [provider]  clopidogrel (PLAVIX) 75 MG tablet Take 75 mg by mouth daily.   Yes [provider]  ferrous sulfate 325 (65 FE) MG tablet Take 325 mg by mouth daily. 05/15/21  Yes [provider]  insulin aspart protamine- aspart (NOVOLOG MIX 70/30) (70-30) 100 UNIT/ML injection Inject 0-16 Units into the skin 2 (two) times daily with a meal. Sliding scale   Yes [provider]  Insulin Pen Needle 32G X 4 MM MISC Use as directed daily at bedtime. 05/23/19  Yes Hongalgi, Lenis Dickinson, MD  midodrine (PROAMATINE) 5 MG tablet Take 1 tablet (5 mg total) by mouth 3 (three) times daily with meals. 04/27/21  Yes  Charisma Charlot, Shaune Pascal, MD  Multiple Vitamin (MULTIVITAMIN WITH MINERALS) TABS tablet Take 1 tablet by mouth daily. 05/23/19  Yes Hongalgi, Lenis Dickinson, MD  [facility] VERIO test strip Check blood sugar twice daily 02/01/21  Yes [provider]  simvastatin (ZOCOR) 40 MG tablet Take 40 mg by mouth daily.   Yes [provider]  torsemide (DEMADEX) 20 MG tablet Take 2 tablets (40 mg total) by mouth daily. 03/04/21  Yes Flint Hakeem, Shaune Pascal, MD  vitamin C (ASCORBIC ACID) 500 MG tablet Take 500 mg by mouth daily.   Yes [provider]  iron polysaccharides (FERREX 150) 150 MG capsule Take 1 capsule (150 mg total) by mouth daily. Patient not taking: Reported on 07/07/2021 05/23/19   Modena Jansky, MD    Past Medical History: Past Medical History:  Diagnosis Date   Allergy    Atrial fibrillation [facility])    Cataract    CHF (congestive heart failure) ([place])    Chronic kidney disease    Renal Insuffiecny   Colon polyps 2012   Colonoscopy   Diabetes mellitus    type 2   Diverticulosis 2012   Colonoscopy    Hyperglycemia 04/2019   Hyperlipidemia    Hypertension    LBBB (left bundle branch block)    Pneumonia 2015   3 times   Stroke [facility]) 2015    Past Surgical History: Past Surgical History:  Procedure Laterality Date   AMPUTATION Right 04/28/2016   Procedure: Right Transmetatarsal Amputation;  Surgeon: Newt Minion, MD;  Location: [place];  Service: Orthopedics;  Laterality: Right;   AMPUTATION TOE Right    CATARACT EXTRACTION Bilateral    both eyes   COLON SURGERY     to repair intestines as child   COLONOSCOPY  10/20/2011   Procedure: COLONOSCOPY;  Surgeon: Inda Castle, MD;  Location: [facility];  Service: Endoscopy;  Laterality: N/A;   FEMORAL-POPLITEAL BYPASS GRAFT Right 04/07/2016   Procedure: BYPASS GRAFT FEMORAL-POPLITEAL ARTERY-RIGHT;  Surgeon: Angelia Mould, MD;  Location: [facility] OR;  Service: Vascular;  Laterality: Right;   HOT HEMOSTASIS  10/20/2011    Procedure: HOT HEMOSTASIS (ARGON PLASMA COAGULATION/BICAP);  Surgeon: Inda Castle, MD;  Location: [facility];  Service: Endoscopy;  Laterality: N/A;   KNEE ARTHROSCOPY Left    left   NECK SURGERY     disectomy   PERIPHERAL VASCULAR CATHETERIZATION Right 04/05/2016   Procedure: Lower Extremity Angiography;  Surgeon: Rosetta Posner, MD;  Location: [place] [facility];  Service: Cardiovascular;  Laterality: Right;   PERIPHERAL VASCULAR CATHETERIZATION N/A 04/05/2016   Procedure: Abdominal Aortogram;  Surgeon: Rosetta Posner, MD;  Location: [place] [facility];  Service: Cardiovascular;  Laterality: N/A;   RIGHT/LEFT HEART CATH AND CORONARY ANGIOGRAPHY N/A 02/07/2019  Procedure: RIGHT/LEFT HEART CATH AND CORONARY ANGIOGRAPHY;  Surgeon: Jolaine Artist, MD;  Location: [place] [facility];  Service: Cardiovascular;  Laterality: N/A;   VEIN HARVEST Right 04/07/2016   Procedure: VEIN HARVEST GREATER SAPHENOUS VIEN ;  Surgeon: Angelia Mould, MD;  Location: [facility] OR;  Service: Vascular;  Laterality: Right;    Family History: Family History  Problem Relation Age of Onset   Diabetes Mellitus II Mother    Heart disease Brother        before age 18   Colon cancer Neg Hx    Esophageal cancer Neg Hx    Stomach cancer Neg Hx    Anesthesia problems Neg Hx    Hypotension Neg Hx    Malignant hyperthermia Neg Hx    Pseudochol deficiency Neg Hx     Social History: Social History   Socioeconomic History   Marital status: Married    Spouse name: Zac Torti    Number of children: 3   Years of education: Not on file   Highest education level: Not on file  Occupational History   Occupation: Retired  Tobacco Use   Smoking status: Every Day    Packs/day: 0.75    Years: 50.00    Pack years: 37.50    Types: Cigarettes   Smokeless tobacco: Never   Tobacco comments:    4 cigs/day currently  Vaping Use   Vaping Use: Never used  Substance and Sexual Activity   Alcohol use: Not Currently     Comment: rarely   Drug use: No   Sexual activity: Not on file  Other Topics Concern   Not on file  Social History Narrative   Not on file   Social Determinants of Health   Financial Resource Strain: Low Risk    Difficulty of Paying Living Expenses: Not very hard  Food Insecurity: No Food Insecurity   Worried About Charity fundraiser in the Last Year: Never true   Ran Out of Food in the Last Year: Never true  Transportation Needs: No Transportation Needs   Lack of Transportation (Medical): No   Lack of Transportation (Non-Medical): No  Physical Activity: Not on file  Stress: Not on file  Social Connections: Not on file    Allergies:  Allergies  Allergen Reactions   Bee Venom Itching, Swelling and Other (See Comments)    Dizziness      Objective:    Vital Signs:   Pulse Rate:  [28-107] 102 (10/13 1015) Resp:  [10-24] 11 (10/13 1015) BP: (97-116)/(66-87) 106/76 (10/13 1015) SpO2:  [91 %-100 %] 100 % (10/13 1015)    Weight change: There were no vitals filed for this visit.  Intake/Output:   Intake/Output Summary (Last 24 hours) at 07/07/2021 1019 Last data filed at 07/07/2021 0622 Gross per 24 hour  Intake 50 ml  Output 300 ml  Net -250 ml      Physical Exam    General: Moaning with any movement. Appear weak. Frail HEENT: normal Neck: supple. JVP to jaw . Carotids 2+ bilat; no bruits. No lymphadenopathy or thyromegaly appreciated. Cor: PMI nondisplaced. Regular rate & rhythm. No rubs, gallops or murmurs. Lungs: Rhonchi throughout. Decreaed on right.  Abdomen: soft, nontender, nondistended. No hepatosplenomegaly. No bruits or masses. Good bowel sounds. Extremities: no cyanosis, clubbing, rash, R forefoot amputation. and LLE 2+ edema extending thighs.  Neuro: alert & orientedx3, cranial nerves grossly intact. moves all 4 extremities w/o difficulty. Affect pleasant Skin: Lower extremities with multiple  partial thickness wounds.    Telemetry  SR 90s    EKG    Sinus Tach 106 bpm LBBB Labs   Basic Metabolic Panel: Recent Labs  Lab 07/07/21 0005  NA 133*  K 5.4*  CL 96*  CO2 19*  GLUCOSE 145*  BUN 132*  CREATININE 4.81*  CALCIUM 8.9    Liver Function Tests: Recent Labs  Lab 07/07/21 0005  AST 68*  ALT 50*  ALKPHOS 149*  BILITOT 2.8*  PROT 5.8*  ALBUMIN 2.6*   No results for input(s): LIPASE, AMYLASE in the last 168 hours. No results for input(s): AMMONIA in the last 168 hours.  CBC: Recent Labs  Lab 07/07/21 0005  WBC 9.5  NEUTROABS 8.1*  HGB 18.0*  HCT 50.7  MCV 89.9  PLT 63*    Cardiac Enzymes: No results for input(s): CKTOTAL, CKMB, CKMBINDEX, TROPONINI in the last 168 hours.  BNP: BNP (last 3 results) Recent Labs    01/19/21 0954 03/04/21 1106 07/07/21 0005  BNP 3,838.2* 3,831.8* >4,500.0*    ProBNP (last 3 results) No results for input(s): PROBNP in the last 8760 hours.   CBG: No results for input(s): GLUCAP in the last 168 hours.  Coagulation Studies: No results for input(s): LABPROT, INR in the last 72 hours.   Imaging   DG Chest Port 1 View  Result Date: 07/07/2021 CLINICAL DATA:  Shortness of breath EXAM: PORTABLE CHEST 1 VIEW COMPARISON:  01/18/2021 FINDINGS: Shallow inspiration with elevation of the right hemidiaphragm. Probable small bilateral pleural effusions. Cardiac enlargement. Bilateral perihilar infiltrates, greater on the right, likely edema. No pneumothorax. IMPRESSION: Cardiac enlargement with bilateral pleural effusions. Perihilar infiltration is likely edema. Electronically Signed   By: Lucienne Capers M.D.   On: 07/07/2021 00:32     Medications:     Current Medications:   Infusions:   ceFAZolin (ANCEF) IV Stopped (07/07/21 0622)      Patient Profile   [name] is a 80 y.o. male with complicated past medical history: end stage biventricular HF, previous non-obstructive CAD on cath 2011 but with high-grade LM CAD in 5/20, PAD s/p right fem-pop in  2017, DM type 2, HTN, HLD, stroke, CKD stage IV, tobacco abuse, and SDH 01/2021 ( not a surgical candidate).   Admitted with ARF/CKD IV and End Stage Biventricular Heart Failure.   Assessment/Plan   End Stage Biventricular Heart Failure, NICM  -Most recent ECHO 02/2021 EF 20-25% and moderately reduced RV.  - In the past he was turned down for ICD/CRT-D  given co-morbidities.  -Presenting with volume overload and worsening renal function. Marked volume overload noted. - Give 120 mg IV lasix for comfort but will likely not change trajectory.  -Off GDMT with CKD/hypotension.  Nephrology recommending comfort care.  - Not a candidate for mechanical support   2. ARF on CKD Stage IV  -Nephrology consulted. In the past Mr Zurn did not want to pursue dialysis and this was confirmed with family. Dialysis would have been difficult with multiple co-morbidities.  -Dr Posey Pronto recommend transition to comfort care  3. R Pleural Effusion  -Noted on CXR   4. CAD -Cath 02/07/19 with likely high-grade distal LM lesion. Not a candidate for revascularization.  -HS Trop 316>314  5. H/O SDH 12/2020  6.. DNR/DNI Palliative Care consulted.  Moaning with any movement. ? Comfort drips.    Length of Stay: 0  Darrick Grinder, NP  07/07/2021, 10:19 AM  Advanced Heart Failure Team Pager [phone] (M-F; 7a - 5p)  Please contact [facility] for night-coverage after hours (4p -7a ) and weekends on [email or web address]  80 y/o male with end-stage systolic HF, CKD 4 now admitted with progressive renal failure and HF with SOB and pain. SCr 3 -> 5. He is anuric   General:  Appears terminally ill. Wasted  HEENT: normal Neck: supple. JVP to ear Carotids 2+ bilat; no bruits. No lymphadenopathy or thryomegaly appreciated. Cor: PMI nondisplaced. Regular rate & rhythm. +s3 2/6 TR Lungs: + rhonchi Abdomen: soft, nontender, mildly distended. No hepatosplenomegaly. No bruits or masses. Good bowel sounds. Extremities: no cyanosis,  clubbing, rash, 3+ edema Neuro: lethargic but will arouse and respond   He is actively dying from end-stage HF and renal failure. He is not candidate for dialysis or advanced HF therapies. Stressed to his family the need to keep him comfortable.Palliative Care has been consulted.   Glori Bickers, MD  5:32 PM

## 2021-07-07 NOTE — Progress Notes (Signed)
PROGRESS NOTE                                                                                                                                                                                                             Patient Demographics:    [name], is a 80 y.o. male, DOB - [birth year], MLY:[id number]  Outpatient Primary MD for the patient is Avva, Ravisankar, MD    LOS - 0  Admit date - 06/26/2021    Chief Complaint  Patient presents with   Shortness of Breath   Leg Swelling       Brief Narrative (HPI from H&P)  80 yo AAM with hx of biventricular failure with an EF of 20 to 25%, CKD stage IV with a baseline creatinine of 2.8-3.0, type 2 diabetes on insulin therapy, history of failure to thrive, coronary artery disease, history of stroke presents to the ER today with worsening weakness, multiple falls at home, inability to urinate, leg edema, weight loss and getting weaker.  In the ER diagnosed with AKI, priapism, severe cachexia and was admitted to the hospital.   Subjective:    [name] today in bed somnolent, unable to answer questions or follow commands reliably.   Assessment  & Plan :      Acute renal failure on top of CKD stage IV.  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  For now supportive care, will involve palliative care as well for goals of care.  Monitor closely.-   2.  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  Does have some third spacing of fluids but appears intravascularly dehydrated, CHF team will follow, poor candidate for inotropes due  to his underlying deconditioning and severe cachexia.  Likely will benefit from comfort measures.  3.  History of stroke.  On dual antiplatelet therapy and statin.  4.  Failure to thrive, ongoing weight loss, severe cachexia, severe protein calorie malnutrition with third spacing of fluid.  Supportive care.  Involve palliative  care.  5.  History of right transmetatarsal amputation, ulcers.  Supportive care.  Neosporin ointment 3 times daily.  6.  Metabolic and toxic encephalopathy.  Due to worsening renal failure along with recent administration of narcotics.  No focal deficits.  Will monitor.  Minimize narcotics and benzo use.    7. DM type II.  Sliding scale.   CBG (last 3)  No results for input(s): GLUCAP in the last 72 hours.      Condition - Extremely Guarded  Family Communication  : Wife bedside  Code Status :  DNR  Consults  :  Pall Care, Cards, Renal  PUD Prophylaxis :    Procedures  :            Disposition Plan  :    Status is: Inpatient  Remains inpatient appropriate because:IV treatments appropriate due to intensity of illness or inability to take PO  Dispo: The patient is from: Home              Anticipated d/c is to: Home              Patient currently is not medically stable to d/c.   Difficult to place patient No   DVT Prophylaxis  :    heparin injection 5,000 Units Start: 07/07/21 1400 SCDs Start: 07/07/21 1118    Lab Results  Component Value Date   PLT 63 (L) 07/07/2021    Diet :  Diet Order             Diet NPO time specified Except for: Sips with Meds  Diet effective now                    Inpatient Medications  Scheduled Meds:  aspirin EC  81 mg Oral Daily   clopidogrel  75 mg Oral Daily   heparin injection (subcutaneous)  5,000 Units Subcutaneous Q8H   insulin aspart  0-9 Units Subcutaneous QID   neomycin-bacitracin-polymyxin   Topical TID   simvastatin  40 mg Oral Daily   sodium zirconium cyclosilicate  10 g Oral TID    Continuous Infusions:   ceFAZolin (ANCEF) IV Stopped (07/07/21 0622)   PRN Meds:.acetaminophen **OR** acetaminophen, traMADol  Antibiotics  :    Anti-infectives (From admission, onward)    Start     Dose/Rate Route Frequency Ordered Stop   07/07/21 0600  ceFAZolin (ANCEF) IVPB 1 g/50 mL premix        1 g 100 mL/hr over 30 Minutes Intravenous Every 8 hours 07/07/21 0502 07/10/21 0559        Time Spent in minutes  30   Lala Lund M.D on 07/07/2021 at 11:21 AM  To page go to [email or web address]   [facility] -  Office  [phone]  See all Orders from today for further details    Objective:   Vitals:   07/07/21 1000 07/07/21 1015 07/07/21 1045 07/07/21 1115  BP: 102/87 106/76 100/79 101/73  Pulse: (!) 102 (!) 102 (!) 101 (!) 101  Resp: 13 11 12 11   SpO2: 100% 100% 100% 98%    Wt Readings from Last 3 Encounters:  04/27/21 63.7 kg  03/04/21 67.7 kg  01/22/21 66 kg     Intake/Output Summary (Last 24 hours) at 07/07/2021 1121 Last data filed at 07/07/2021 0622 Gross per 24 hour  Intake 50 ml  Output 300 ml  Net -250 ml     Physical Exam  Cachectic, Somnolent , moves all 4 extremities  to pain [place].AT,PERRAL Supple Neck, No JVD,   Symmetrical Chest wall movement, Good air movement bilaterally, CTAB RRR,No Gallops,Rubs or new Murmurs,  +ve B.Sounds, Abd Soft, No tenderness,   R transmetatarsal amputation, L leg has 1+ edema with a healing laceration on the dorsal lateral foot skin   Data Review:    CBC Recent Labs  Lab 07/07/21 0005  WBC 9.5  HGB 18.0*  HCT 50.7  PLT 63*  MCV 89.9  MCH 31.9  MCHC 35.5  RDW 21.6*  LYMPHSABS 0.2*  MONOABS 0.7  EOSABS 0.0  BASOSABS 0.1    Recent Labs  Lab 07/07/21 0005  NA 133*  K 5.4*  CL 96*  CO2 19*  GLUCOSE 145*  BUN 132*  CREATININE 4.81*  CALCIUM 8.9  AST 68*  ALT 50*  ALKPHOS 149*  BILITOT 2.8*  ALBUMIN 2.6*  BNP >4,500.0*     ------------------------------------------------------------------------------------------------------------------ No results for input(s): CHOL, HDL, LDLCALC, TRIG, CHOLHDL, LDLDIRECT in the last 72 hours.  Lab Results  Component Value Date   HGBA1C 7.9 (H) 01/19/2021   ------------------------------------------------------------------------------------------------------------------ No results for input(s): TSH, T4TOTAL, T3FREE, THYROIDAB in the last 72 hours.  Invalid input(s): FREET3  Cardiac Enzymes No results for input(s): CKMB, TROPONINI, MYOGLOBIN in the last 168 hours.  Invalid input(s): CK ------------------------------------------------------------------------------------------------------------------    Component Value Date/Time   BNP >4,500.0 (H) 07/07/2021 0005     Radiology Reports DG Chest Port 1 View  Result Date: 07/07/2021 CLINICAL DATA:  Shortness of breath EXAM: PORTABLE CHEST 1 VIEW COMPARISON:  01/18/2021 FINDINGS: Shallow inspiration with elevation of the right hemidiaphragm. Probable small bilateral pleural effusions. Cardiac enlargement. Bilateral perihilar infiltrates, greater on the right, likely edema. No pneumothorax. IMPRESSION: Cardiac enlargement with bilateral pleural effusions. Perihilar infiltration is likely edema. Electronically Signed   By: Lucienne Capers M.D.   On: 07/07/2021 00:32

## 2021-07-07 NOTE — Assessment & Plan Note (Signed)
Verified pt's DNR/DNI status with wife.

## 2021-07-07 NOTE — Assessment & Plan Note (Signed)
Pt has LE edema. May need advanced CHF team to assist with diuresis(if possible) given pt's advanced renal failure.

## 2021-07-07 NOTE — Progress Notes (Signed)
Patient ID: [name], male   DOB: [birth year], 80 y.o.   MRN: [id number] Asked by Dr. Candiss Norse to offer an opinion on this patient's dialysis candidacy/acute kidney injury on chronic kidney disease stage IV as a curbside consult.  Database reviewed and found to be pertinent for a 80 year old man with underlying history of biventricular heart failure (EF 20 to 25%) with type 2 diabetes mellitus, coronary artery disease, history of CVA and chronic kidney disease stage IV at baseline (creatinine 2.8-3.0).  Brought to the emergency room by his wife yesterday with concerns of increasing lower extremity edema and difficulty/inability to urinate for the past week or so.  It appears that he has been having some progressive deconditioning over the past few weeks and in the past has had varying opinions about dialysis-most recently reported that he would not undertake dialysis to prolong his life.  When seen, he was encephalopathic and not able to offer any reliable history or opinion.  He appears to be quite cachectic with sarcopenia and lower extremity edema.  I had a discussion with his daughter who was at bedside and his wife [name] (via phone) to confirm Mr. Barretta decision not to pursue dialysis.  Additionally, he appears to be significantly deconditioned with multiple underlying comorbidities that would make it challenging for him to undertake maintenance hemodialysis.  I recommend transition to comfort care measures/hospice as maximizing medical management is likely to be challenging.  Elmarie Shiley MD [facility]. Office # [phone] Pager # [phone] 10:59 AM

## 2021-07-08 ENCOUNTER — Inpatient Hospital Stay (HOSPITAL_COMMUNITY): Payer: Medicare HMO

## 2021-07-08 DIAGNOSIS — Z515 Encounter for palliative care: Secondary | ICD-10-CM | POA: Diagnosis not present

## 2021-07-08 DIAGNOSIS — N170 Acute kidney failure with tubular necrosis: Secondary | ICD-10-CM | POA: Diagnosis not present

## 2021-07-08 DIAGNOSIS — R627 Adult failure to thrive: Secondary | ICD-10-CM | POA: Diagnosis not present

## 2021-07-08 DIAGNOSIS — N184 Chronic kidney disease, stage 4 (severe): Secondary | ICD-10-CM | POA: Diagnosis not present

## 2021-07-08 DIAGNOSIS — I5082 Biventricular heart failure: Secondary | ICD-10-CM | POA: Diagnosis not present

## 2021-07-08 DIAGNOSIS — R570 Cardiogenic shock: Secondary | ICD-10-CM | POA: Diagnosis not present

## 2021-07-08 DIAGNOSIS — N179 Acute kidney failure, unspecified: Secondary | ICD-10-CM | POA: Diagnosis not present

## 2021-07-08 LAB — COMPREHENSIVE METABOLIC PANEL
ALT: 39 U/L (ref 0–44)
AST: 42 U/L — ABNORMAL HIGH (ref 15–41)
Albumin: 2.5 g/dL — ABNORMAL LOW (ref 3.5–5.0)
Alkaline Phosphatase: 130 U/L — ABNORMAL HIGH (ref 38–126)
Anion gap: 18 — ABNORMAL HIGH (ref 5–15)
BUN: 142 mg/dL — ABNORMAL HIGH (ref 8–23)
CO2: 18 mmol/L — ABNORMAL LOW (ref 22–32)
Calcium: 8.6 mg/dL — ABNORMAL LOW (ref 8.9–10.3)
Chloride: 98 mmol/L (ref 98–111)
Creatinine, Ser: 4.89 mg/dL — ABNORMAL HIGH (ref 0.61–1.24)
GFR, Estimated: 12 mL/min — ABNORMAL LOW (ref >60)
Glucose, Bld: 126 mg/dL — ABNORMAL HIGH (ref 70–99)
Potassium: 6 mmol/L — ABNORMAL HIGH (ref 3.5–5.1)
Sodium: 134 mmol/L — ABNORMAL LOW (ref 135–145)
Total Bilirubin: 2.8 mg/dL — ABNORMAL HIGH (ref 0.3–1.2)
Total Protein: 5.5 g/dL — ABNORMAL LOW (ref 6.5–8.1)

## 2021-07-08 LAB — CBC WITH DIFFERENTIAL/PLATELET
Abs Immature Granulocytes: 0.09 10*3/uL — ABNORMAL HIGH (ref 0.00–0.07)
Basophils Absolute: 0 10*3/uL (ref 0.0–0.1)
Basophils Relative: 0 %
Eosinophils Absolute: 0 10*3/uL (ref 0.0–0.5)
Eosinophils Relative: 0 %
HCT: 50.8 % (ref 39.0–52.0)
Hemoglobin: 17.8 g/dL — ABNORMAL HIGH (ref 13.0–17.0)
Immature Granulocytes: 1 %
Lymphocytes Relative: 2 %
Lymphs Abs: 0.2 10*3/uL — ABNORMAL LOW (ref 0.7–4.0)
MCH: 31.1 pg (ref 26.0–34.0)
MCHC: 35 g/dL (ref 30.0–36.0)
MCV: 88.7 fL (ref 80.0–100.0)
Monocytes Absolute: 0.8 10*3/uL (ref 0.1–1.0)
Monocytes Relative: 7 %
Neutro Abs: 10.6 10*3/uL — ABNORMAL HIGH (ref 1.7–7.7)
Neutrophils Relative %: 90 %
Platelets: 42 10*3/uL — ABNORMAL LOW (ref 150–400)
RBC: 5.73 MIL/uL (ref 4.22–5.81)
RDW: 21.4 % — ABNORMAL HIGH (ref 11.5–15.5)
WBC: 11.6 10*3/uL — ABNORMAL HIGH (ref 4.0–10.5)
nRBC: 1 % — ABNORMAL HIGH (ref 0.0–0.2)

## 2021-07-08 LAB — BRAIN NATRIURETIC PEPTIDE: B Natriuretic Peptide: >4500 pg/mL — ABNORMAL HIGH (ref 0.0–100.0)

## 2021-07-08 LAB — GLUCOSE, CAPILLARY: Glucose-Capillary: 113 mg/dL — ABNORMAL HIGH (ref 70–99)

## 2021-07-08 LAB — UREA NITROGEN, URINE: Urea Nitrogen, Ur: 477 mg/dL

## 2021-07-08 LAB — MAGNESIUM: Magnesium: 2.9 mg/dL — ABNORMAL HIGH (ref 1.7–2.4)

## 2021-07-08 MED ORDER — INSULIN ASPART 100 UNIT/ML IJ SOLN: 0.0000 [IU] | Freq: Three times a day (TID) | INTRAMUSCULAR | Status: DC

## 2021-07-08 MED ORDER — BISACODYL 10 MG RE SUPP: 10.0000 mg | Freq: Every day | RECTAL | Status: DC | PRN

## 2021-07-08 MED ORDER — ONDANSETRON HCL 4 MG/2ML IJ SOLN: 4.0000 mg | Freq: Four times a day (QID) | INTRAMUSCULAR | Status: DC | PRN

## 2021-07-08 MED ORDER — BIOTENE DRY MOUTH MT LIQD: 15.0000 mL | OROMUCOSAL | Status: DC | PRN

## 2021-07-08 MED ORDER — SODIUM ZIRCONIUM CYCLOSILICATE 10 G PO PACK: 10.0000 g | PACK | Freq: Three times a day (TID) | ORAL | Status: DC

## 2021-07-08 MED ORDER — GLYCOPYRROLATE 0.2 MG/ML IJ SOLN: 0.4000 mg | INTRAMUSCULAR | Status: DC | PRN

## 2021-07-08 MED ORDER — SODIUM POLYSTYRENE SULFONATE 15 GM/60ML PO SUSP
30.0000 g | Freq: Once | ORAL | Status: DC
  Filled 2021-07-08: qty 120

## 2021-07-08 MED ORDER — LORAZEPAM 2 MG/ML IJ SOLN: 1.0000 mg | INTRAMUSCULAR | Status: DC | PRN

## 2021-07-08 MED ORDER — FUROSEMIDE 10 MG/ML IJ SOLN
20.0000 mg | Freq: Once | INTRAMUSCULAR | Status: AC
  Administered 2021-07-08: 20 mg via INTRAVENOUS
  Filled 2021-07-08: qty 2

## 2021-07-08 MED ORDER — LORAZEPAM 2 MG/ML PO CONC: 1.0000 mg | ORAL | Status: DC | PRN

## 2021-07-08 MED ORDER — LACTATED RINGERS IV SOLN: INTRAVENOUS | Status: DC

## 2021-07-08 MED ORDER — HYDROMORPHONE HCL 1 MG/ML IJ SOLN
0.5000 mg | INTRAMUSCULAR | Status: DC | PRN
  Administered 2021-07-08 – 2021-07-09 (×3): 0.5 mg via INTRAVENOUS
  Filled 2021-07-08 (×4): qty 0.5

## 2021-07-08 MED ORDER — DIPHENHYDRAMINE HCL 50 MG/ML IJ SOLN: 12.5000 mg | INTRAMUSCULAR | Status: DC | PRN

## 2021-07-08 MED ORDER — GLYCOPYRROLATE 0.2 MG/ML IJ SOLN
0.4000 mg | INTRAMUSCULAR | Status: DC | PRN
  Administered 2021-07-08: 0.4 mg via INTRAVENOUS
  Filled 2021-07-08: qty 2

## 2021-07-08 MED ORDER — POLYVINYL ALCOHOL 1.4 % OP SOLN
1.0000 [drp] | Freq: Four times a day (QID) | OPHTHALMIC | Status: DC | PRN
  Filled 2021-07-08: qty 15

## 2021-07-08 MED ORDER — GLYCOPYRROLATE 0.2 MG/ML IJ SOLN
0.4000 mg | INTRAMUSCULAR | Status: DC
  Administered 2021-07-08 – 2021-07-09 (×3): 0.4 mg via INTRAVENOUS
  Filled 2021-07-08 (×3): qty 2

## 2021-07-08 NOTE — Progress Notes (Signed)
PROGRESS NOTE                                                                                                                                                                                                             Patient Demographics:    [name], is a 80 y.o. 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  In the ER diagnosed with AKI, priapism, severe cachexia and was admitted to the hospital.   Subjective:   Patient in bed, appears comfortable, denies any headache, no fever, no chest pain or pressure, no shortness of breath , no abdominal pain. No new focal weakness.    Assessment  & Plan :      Acute renal failure on top of CKD stage IV.  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  For now supportive care, Pall Care following, unfortunately wife has some unrealistic expectations and wants to continue present Rx, has failed swallow eval by SLP - monitor, extremely poor prognosis. Note Renal and Cards agree with Comfort  care.  2.  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  Does have some third spacing of fluids but appears intravascularly dehydrated, CHF team agrees with comfort care, poor candidate for inotropes due to his underlying deconditioning and severe cachexia.  Likely will benefit from comfort measures.  3.  History  of stroke.  On dual antiplatelet therapy and statin.  4.  Failure to thrive, ongoing weight loss, severe cachexia, severe protein calorie malnutrition with third spacing of fluid.  Supportive care. SLP following, failed swallow,  palliative care on board.  5.  History of right transmetatarsal amputation, ulcers.  Supportive care.  Neosporin ointment 3 times daily.  6.  Metabolic and toxic encephalopathy.  Due to worsening renal failure along with recent administration of narcotics.  No focal deficits.  Will monitor.  Minimize narcotics and benzo use.    7. Hyperkalemia - NPO, hence Kayexalate enema, Fluids + lasix.  8. DM type II.  Sliding scale.   CBG (last 3)  Recent Labs    07/07/21 1544 07/07/21 1936 07/08/21 0739  GLUCAP 137* 124* 113*        Condition - Extremely Guarded  Family Communication  : Wife bedside 07/07/21, 07/08/21  Code Status :  DNR  Consults  :  Pall Care, Cards, Renal  PUD Prophylaxis :    Procedures  :            Disposition Plan  :    Status is: Inpatient  Remains inpatient appropriate because:IV treatments appropriate due to intensity of illness or inability to take PO  Dispo: The patient is from: Home              Anticipated d/c is to: Home              Patient currently is not medically stable to d/c.   Difficult to place patient No   DVT Prophylaxis  :    Place and maintain sequential compression device Start: 07/08/21 0836 SCDs Start: 07/07/21 1118    Lab Results  Component Value Date   PLT 42 (L) 07/08/2021    Diet :  Diet Order             Diet NPO time specified Except for: Sips  with Meds  Diet effective now                    Inpatient Medications  Scheduled Meds:  aspirin EC  81 mg Oral Daily   clopidogrel  75 mg Oral Daily   furosemide  20 mg Intravenous Once   insulin aspart  0-9 Units Subcutaneous TID AC & HS   mouth rinse  15 mL Mouth Rinse BID   neomycin-bacitracin-polymyxin   Topical TID   simvastatin  40 mg Oral Daily   sodium polystyrene  30 g Rectal Once   sodium zirconium cyclosilicate  10 g Oral TID   Continuous Infusions:   ceFAZolin (ANCEF) IV 1 g (07/08/21 0945)   lactated ringers     PRN Meds:.acetaminophen **OR** acetaminophen, traMADol  Antibiotics  :    Anti-infectives (From admission, onward)    Start     Dose/Rate Route Frequency Ordered Stop   07/08/21 1000  ceFAZolin (ANCEF) IVPB 1 g/50 mL premix        1 g 100 mL/hr over 30 Minutes Intravenous Every 12 hours 07/07/21 1747 07/10/21 0959   07/07/21 0600  ceFAZolin (ANCEF) IVPB 1 g/50 mL premix  Status:  Discontinued        1 g 100 mL/hr over 30 Minutes Intravenous Every 8 hours 07/07/21 0502 07/07/21 1747        Time Spent in minutes  [place] M.D on 07/08/2021 at 10:37 AM  To page go to [email or web address]   [facility] -  Office  [phone]  See all Orders from today for further details    Objective:   Vitals:   07/07/21 2050 07/08/21 0605 07/08/21 0615 07/08/21 0745  BP: 102/77 98/77  104/74  Pulse: 85 100  100  Resp: 12 14  17   Temp: (!) 97.5 F (36.4 C) 97.6 F (36.4 C)  98.1 F (36.7 C)  TempSrc:    Oral  SpO2: 92% 94%  95%  Weight:   72.1 kg   Height:        Wt Readings from Last 3 Encounters:  07/08/21 72.1 kg  04/27/21 63.7 kg  03/04/21 67.7 kg     Intake/Output Summary (Last 24 hours) at 07/08/2021 1037 Last data filed at 07/08/2021 0746 Gross per 24 hour  Intake 0 ml  Output 250 ml  Net -250 ml     Physical Exam  Cachectic, more awake , moves all 4 extremities by self [place].AT,PERRAL Supple Neck, No JVD,    Symmetrical Chest wall movement, Good air movement bilaterally, CTAB RRR,No Gallops, Rubs or new Murmurs,  +ve B.Sounds, Abd Soft, No tenderness,   R transmetatarsal amputation, L leg has 1+ edema with a healing laceration on the dorsal lateral foot skin   Data Review:    CBC Recent Labs  Lab 07/07/21 0005 07/07/21 1200 07/08/21 0146  WBC 9.5 11.8* 11.6*  HGB 18.0* 18.0* 17.8*  HCT 50.7 51.0 50.8  PLT 63* 49* 42*  MCV 89.9 91.1 88.7  MCH 31.9 32.1 31.1  MCHC 35.5 35.3 35.0  RDW 21.6* 22.3* 21.4*  LYMPHSABS 0.2*  --  0.2*  MONOABS 0.7  --  0.8  EOSABS 0.0  --  0.0  BASOSABS 0.1  --  0.0    Recent Labs  Lab 07/07/21 0005 07/07/21 1200 07/08/21 0146  NA 133*  --  134*  K 5.4*  --  6.0*  CL 96*  --  98  CO2 19*  --  18*  GLUCOSE 145*  --  126*  BUN 132*  --  142*  CREATININE 4.81* 4.84* 4.89*  CALCIUM 8.9  --  8.6*  AST 68*  --  42*  ALT 50*  --  39  ALKPHOS 149*  --  130*  BILITOT 2.8*  --  2.8*  ALBUMIN 2.6*  --  2.5*  MG  --   --  2.9*  HGBA1C  --  7.6*  --   BNP >4,500.0*  --  >4,500.0*    ------------------------------------------------------------------------------------------------------------------ No results for input(s): CHOL, HDL, LDLCALC, TRIG, CHOLHDL, LDLDIRECT in the last 72 hours.  Lab Results  Component Value Date   HGBA1C 7.6 (H) 07/07/2021   ------------------------------------------------------------------------------------------------------------------ No results for input(s): TSH, T4TOTAL, T3FREE, THYROIDAB in the last 72 hours.  Invalid input(s): FREET3  Cardiac Enzymes No results for input(s): CKMB, TROPONINI, MYOGLOBIN in the last 168 hours.  Invalid input(s): CK ------------------------------------------------------------------------------------------------------------------    Component Value Date/Time   BNP >4,500.0 (H) 07/08/2021 0146     Radiology Reports DG Chest Port 1 View  Result Date: 07/07/2021 CLINICAL  DATA:  Shortness of breath EXAM: PORTABLE CHEST 1 VIEW COMPARISON:  01/18/2021 FINDINGS: Shallow inspiration with elevation of the right hemidiaphragm. Probable small bilateral pleural effusions. Cardiac enlargement. Bilateral perihilar infiltrates, greater on the right, likely edema. No pneumothorax. IMPRESSION: Cardiac enlargement with bilateral pleural effusions. Perihilar infiltration is likely edema. Electronically Signed   By: Lucienne Capers M.D.   On: 07/07/2021 00:32

## 2021-07-08 NOTE — Progress Notes (Addendum)
Advanced Heart Failure Rounding Note  PCP-Cardiologist: None   Subjective:    Appears uncomfortable, moaning. Wife at bedside. Considering making comfort care but still struggling w/ making the decision. Palliative care following.   Objective:   Weight Range: 72.1 kg Body mass index is 23.47 kg/m.   Vital Signs:   Temp:  [97.4 F (36.3 C)-98.1 F (36.7 C)] 98.1 F (36.7 C) (10/14 0745) Pulse Rate:  [46-101] 100 (10/14 0745) Resp:  [10-17] 17 (10/14 0745) BP: (98-106)/(70-80) 104/74 (10/14 0745) SpO2:  [82 %-100 %] 95 % (10/14 0745) Weight:  [71 kg-72.1 kg] 72.1 kg (10/14 0615)    Weight change: Filed Weights   07/07/21 1700 07/08/21 0615  Weight: 71 kg 72.1 kg    Intake/Output:   Intake/Output Summary (Last 24 hours) at 07/08/2021 1146 Last data filed at 07/08/2021 0746 Gross per 24 hour  Intake 0 ml  Output 250 ml  Net -250 ml      Physical Exam   General:  cathectic and chronically ill male, appears uncomfortable in pain. Moaning.  HEENT: Normal Neck: Supple. JVP elevated to jaw . Carotids 2+ bilat; no bruits. No lymphadenopathy or thyromegaly appreciated. Cor: PMI nondisplaced. Regular rate & rhythm. S3 gallop + TR murmur  Lungs: course bilaterally  Abdomen: Soft, nontender, nondistended. No hepatosplenomegaly. No bruits or masses. Good bowel sounds. Extremities: No cyanosis, clubbing, rash, 3+ bilateral LE edema, Rt TMT amputation  Neuro: lethargic but will arouse to stimuli, not conversant   Telemetry   N/A  EKG    N/A   Labs    CBC Recent Labs    07/07/21 0005 07/07/21 1200 07/08/21 0146  WBC 9.5 11.8* 11.6*  NEUTROABS 8.1*  --  10.6*  HGB 18.0* 18.0* 17.8*  HCT 50.7 51.0 50.8  MCV 89.9 91.1 88.7  PLT 63* 49* 42*   Basic Metabolic Panel Recent Labs    07/07/21 0005 07/07/21 1200 07/08/21 0146  NA 133*  --  134*  K 5.4*  --  6.0*  CL 96*  --  98  CO2 19*  --  18*  GLUCOSE 145*  --  126*  BUN 132*  --  142*  CREATININE  4.81* 4.84* 4.89*  CALCIUM 8.9  --  8.6*  MG  --   --  2.9*   Liver Function Tests Recent Labs    07/07/21 0005 07/08/21 0146  AST 68* 42*  ALT 50* 39  ALKPHOS 149* 130*  BILITOT 2.8* 2.8*  PROT 5.8* 5.5*  ALBUMIN 2.6* 2.5*   No results for input(s): LIPASE, AMYLASE in the last 72 hours. Cardiac Enzymes No results for input(s): CKTOTAL, CKMB, CKMBINDEX, TROPONINI in the last 72 hours.  BNP: BNP (last 3 results) Recent Labs    03/04/21 1106 07/07/21 0005 07/08/21 0146  BNP 3,831.8* >4,500.0* >4,500.0*    ProBNP (last 3 results) No results for input(s): PROBNP in the last 8760 hours.   D-Dimer No results for input(s): DDIMER in the last 72 hours. Hemoglobin A1C Recent Labs    07/07/21 1200  HGBA1C 7.6*   Fasting Lipid Panel No results for input(s): CHOL, HDL, LDLCALC, TRIG, CHOLHDL, LDLDIRECT in the last 72 hours. Thyroid Function Tests No results for input(s): TSH, T4TOTAL, T3FREE, THYROIDAB in the last 72 hours.  Invalid input(s): FREET3  Other results:   Imaging    No results found.   Medications:     Scheduled Medications:  aspirin EC  81 mg Oral Daily   clopidogrel  75 mg Oral Daily   furosemide  20 mg Intravenous Once   insulin aspart  0-9 Units Subcutaneous TID AC & HS   mouth rinse  15 mL Mouth Rinse BID   neomycin-bacitracin-polymyxin   Topical TID   simvastatin  40 mg Oral Daily   sodium polystyrene  30 g Rectal Once   sodium zirconium cyclosilicate  10 g Oral TID    Infusions:   ceFAZolin (ANCEF) IV 1 g (07/08/21 0945)   lactated ringers      PRN Medications: acetaminophen **OR** acetaminophen, traMADol    Assessment/Plan   End Stage Biventricular Heart Failure, NICM  -Most recent ECHO 02/2021 EF 20-25% and moderately reduced RV.  - In the past he was turned down for ICD/CRT-D  given co-morbidities.  - Admitted w/ End stage CHF w/ shock and MSOF including anuric renal failure  - Not a candidate for advanced therapies and  not a candidate for HD - Recommend Comfort Care. D/w again w/ wife at bedside today    2. ARF on CKD Stage IV  -Nephrology consulted. In the past Mr Town did not want to pursue dialysis and this was confirmed with family. Dialysis would have been difficult with multiple co-morbidities.  -Dr Posey Pronto recommend transition to comfort care   3. R Pleural Effusion  - Noted on CXR    4. CAD - Cath 02/07/19 with likely high-grade distal LM lesion. Not a candidate for revascularization.  - HS Trop 316>314   5. H/O SDH 12/2020   6.DNR/DNI - Palliative Care following  - Wife not ready to move to full comfort care yet     Length of Stay: 1  Brittainy Simmons, PA-C  07/08/2021, 11:46 AM  Advanced Heart Failure Team Pager [phone] (M-F; 7a - 5p)  Please contact [facility] for night-coverage after hours (5p -7a ) and weekends on [email or web address]   Patient seen and examined with the above-signed Advanced Practice Provider and/or Housestaff. I personally reviewed laboratory data, imaging studies and relevant notes. I independently examined the patient and formulated the important aspects of the plan. I have edited the note to reflect any of my changes or salient points. I have personally discussed the plan with the patient and/or family.  Continues to deteriorate. Moaning in pain. Disoriented. Labs worse.   General:  Terminally-ill appearing.  Moaning in pain HEENT: normal Neck: JVP to ear  No lymphadenopathy or thryomegaly appreciated. Cor: PMI nondisplaced. Regular rate & rhythm. +s3 Lungs: rhonchi gurgling Abdomen: soft + distended  Extremities: cool 3+ edema Neuro: disoriented. Moves all 4   He is actively dying from advanced HF and renal failure. Family has met again with Palliative Care and now being transitioned to comfort care which is what he needs.   Appreciate Palliative Care's efforts. AHF will sign off. Please call if we can help.   Glori Bickers, MD  1:47 PM

## 2021-07-08 NOTE — Progress Notes (Signed)
This chaplain responded to PMT consult for EOL spiritual care for the Pt. wife-[name].  The chaplain understands [name] has stepped away from the bedside.  The Pt. daughter-Kenyatta and brother in law-Omar are at the bedside.  Kenyatta shares the Pt. is resting more comfortably now than he was earlier this afternoon.  The Pt. responds with "thank you" as I introduce myself.  The chaplain listens reflectively and offers emotional support as Dola Factor shares her memories of the Pt. strength and humor. The chaplain understands there is peace for the family in knowing the Pt. is comfortable and has a personal relationship with God.  This chaplain is available for F/U spiritual care as needed.  Chaplain Sallyanne Kuster [phone]

## 2021-07-08 NOTE — Progress Notes (Signed)
Speech Language Pathology Discharge Patient Details Name: [name] MRN: [id number] DOB: Mar 30, 1941 Today's Date: 07/08/2021 Time:  -     Patient discharged from SLP services secondary to pt on comfort care   Please see latest therapy progress note for current level of functioning and progress toward goals.    Progress and discharge plan discussed with patient and/or caregiver:   GO     [name] 07/08/2021, 4:57 PM

## 2021-07-08 NOTE — Plan of Care (Signed)

## 2021-07-08 NOTE — Progress Notes (Signed)
Daily Progress Note   Patient Name: [name]       Date: 07/08/2021 DOB: 06/30/1941  Age: 80 y.o. MRN#: [id number] Attending Physician: Thurnell Lose, MD Primary Care Physician: Prince Solian, MD Admit Date: 07/02/2021  Reason for Consultation/Follow-up: Establishing goals of care  Subjective: Medical records reviewed. Patient assessed at the bedside and is grimacing, moaning. He will occasionally answer questions by nodding his head. Patient's wife [name] is present at the bedside.  Discussed patient's unfortunately poor prognosis and particularly his worsening hyperkalemia, confusion, and kidney function despite treatment. [name] shares that he was able to speak clearly with many family members over the phone yesterday and his confusion is now worse this morning. She has been thinking further about recommendations for comfort care and the concern for causing more suffering than benefit with current treatments and diagnostics. Kirtis has been making statements that indicate he is at peace with his mortality and feels ready to pass peacefully.   Reviewed comfort care - patient would no longer receive aggressive medical interventions such as continuous vital signs, lab work, radiology testing, or medications not focused on comfort. All care would focus on how the patient is looking and feeling. This would include management of any symptoms that may cause discomfort, pain, shortness of breath, cough, nausea, agitation, anxiety, and/or secretions etc. Symptoms would be managed with medications and other non-pharmacological interventions such as spiritual support if requested, repositioning, music therapy, or therapeutic listening. Family verbalized understanding and appreciation. Counseled on  the option of transfer to residential hospice if patient is stable. [name] feels that it would be best to keep him in a consistent environment at this time.  Questions and concerns addressed. G[facility] My [facility] booklet provided for review. PMT will continue to support holistically.  Length of Stay: 1  Current Medications: Scheduled Meds:   furosemide  20 mg Intravenous Once   mouth rinse  15 mL Mouth Rinse BID   neomycin-bacitracin-polymyxin   Topical TID    Continuous Infusions:   PRN Meds: acetaminophen **OR** acetaminophen, antiseptic oral rinse, bisacodyl, diphenhydrAMINE, glycopyrrolate **OR** glycopyrrolate, HYDROmorphone (DILAUDID) injection, LORazepam **OR** LORazepam, ondansetron (ZOFRAN) IV, polyvinyl alcohol, traMADol  Physical Exam Vitals and nursing note reviewed.  Constitutional:      Appearance: He is cachectic. He is ill-appearing.  Cardiovascular:  Rate and Rhythm: Normal rate.  Pulmonary:     Effort: Pulmonary effort is normal.  Neurological:     Mental Status: He is easily aroused. He is lethargic and disoriented.            Vital Signs: BP 104/74 (BP Location: Right Arm)   Pulse 100   Temp 98.1 F (36.7 C) (Oral)   Resp 17   Ht 5\' 9"  (1.753 m)   Wt 72.1 kg   SpO2 95%   BMI 23.47 kg/m  SpO2: SpO2: 95 % O2 Device: O2 Device: Room Air O2 Flow Rate:    Intake/output summary:  Intake/Output Summary (Last 24 hours) at 07/08/2021 1203 Last data filed at 07/08/2021 0746 Gross per 24 hour  Intake 0 ml  Output 250 ml  Net -250 ml   LBM:   Baseline Weight: Weight: 71 kg Most recent weight: Weight: 72.1 kg       Palliative Assessment/Data: 10%      Patient Active Problem List   Diagnosis Date Noted   Pleural effusion on right 07/07/2021   DNR (do not resuscitate)/DNI(Do Not Intubate) 07/07/2021   Pressure injury of skin 07/07/2021   Palliative care by specialist    NSTEMI (non-ST elevated myocardial infarction) ([place]) 01/19/2021    Failure to thrive in adult 01/19/2021   Biventricular heart failure ([place]) 09/24/2019   Protein-calorie malnutrition, severe 05/23/2019   Uncontrolled diabetes mellitus with hyperglycemia ([place]) 05/21/2019   Acute renal failure superimposed on stage 4 chronic kidney disease ([place]) 05/21/2019   Prolonged QT interval 05/21/2019   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 and stage 3 chronic kidney disease ([place]) 04/01/2019   Goals of care, counseling/discussion 04/01/2019   Shingles outbreak 04/01/2019   Stage 4 chronic kidney disease ([place]) 02/05/2019   Acute on chronic congestive heart failure ([place]) 02/04/2019   Aftercare following surgery of the circulatory system 08/09/2016   Status post transmetatarsal amputation of foot, right ([place]) 08/03/2016   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 ([place]) 04/07/2016   Cellulitis and abscess of foot 03/23/2016   Diabetic infection of right foot ([place]) 03/23/2016   Puncture wound of foot, right, complicated 42/70/6237   Malnutrition of moderate degree 03/23/2016   AKI (acute kidney injury) ([place])    Tobacco use disorder 02/28/2016   Acute CVA (cerebrovascular accident) ([place]) 02/27/2016   DM (diabetes mellitus), type 2 with renal complications ([place]) 62/83/1517   Anemia 61/60/7371   Chronic systolic CHF (congestive heart failure) ([place]) 05/05/2015   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  ED workup revealed priapism, bilateral pleural effusions right greater than left, acute renal failure on top of chronic kidney disease. PMT has been consulted to assist with goals of care conversation.   Assessment: Goals of care conversation End Stage Biventricular Heart Failure AKI on CKD4  Recommendations/Plan: Transition to comfort-focused care today Dilaudid PRN for pain/air hunger/comfort Robinul PRN for excessive secretions Ativan PRN for agitation/anxiety Zofran PRN for nausea Liquifilm tears PRN for dry eyes Bisacodyl PRN for constipation Benadryl PRN for itching May have comfort feeding Comfort cart for family Unrestricted visitations in the setting of EOL (per policy) Oxygen PRN 2L or less for comfort. No escalation.   Spiritual care consult appreciated  Goals of Care and Additional Recommendations: Limitations on Scope of Treatment: Full Comfort Care  Prognosis:  < 2 weeks  Discharge Planning: Anticipated Hospital Death  Care plan was discussed with Patient's wife [name], Dr. Candiss Norse  Total time: 45 minutes Greater than 50% of this time was spent in counseling and coordinating care related to the above assessment and plan.  Dorthy Cooler, PA-C Palliative Medicine Team Team phone # [phone]  Thank you for allowing the Palliative Medicine Team to assist in the care of this patient. Please utilize secure chat with additional questions, if there is no response within 30 minutes please call the above phone number.  Palliative Medicine Team  providers are available by phone from 7am to 7pm daily and can be reached through the team cell phone.  Should this patient require assistance outside of these hours, please call the patient's attending physician.

## 2021-07-08 NOTE — Evaluation (Signed)
Clinical/Bedside Swallow Evaluation Patient Details  Name: [name] MRN: [id number] Date of Birth: Dec 20, 1940  Today's Date: 07/08/2021 Time: SLP Start Time (ACUTE ONLY): 2536 SLP Stop Time (ACUTE ONLY): 1004 SLP Time Calculation (min) (ACUTE ONLY): 26 min  Past Medical History:  Past Medical History:  Diagnosis Date   Allergy    Atrial fibrillation ([place])    Cataract    CHF (congestive heart failure) ([place])    Chronic kidney disease    Renal Insuffiecny   Colon polyps 2012   Colonoscopy   Diabetes mellitus    type 2   Diverticulosis 2012   Colonoscopy    Hyperglycemia 04/2019   Hyperlipidemia    Hypertension    LBBB (left bundle branch block)    Pneumonia 2015   3 times   Stroke [facility]) 2015   Past Surgical History:  Past Surgical History:  Procedure Laterality Date   AMPUTATION Right 04/28/2016   Procedure: Right Transmetatarsal Amputation;  Surgeon: Newt Minion, MD;  Location: [place];  Service: Orthopedics;  Laterality: Right;   AMPUTATION TOE Right    CATARACT EXTRACTION Bilateral    both eyes   COLON SURGERY     to repair intestines as child   COLONOSCOPY  10/20/2011   Procedure: COLONOSCOPY;  Surgeon: Inda Castle, MD;  Location: [facility]OS[facility];  Service: Endoscopy;  Laterality: N/A;   FEMORAL-POPLITEAL BYPASS GRAFT Right 04/07/2016   Procedure: BYPASS GRAFT FEMORAL-POPLITEAL ARTERY-RIGHT;  Surgeon: Angelia Mould, MD;  Location: [facility] OR;  Service: Vascular;  Laterality: Right;   HOT HEMOSTASIS  10/20/2011   Procedure: HOT HEMOSTASIS (ARGON PLASMA COAGULATION/BICAP);  Surgeon: Inda Castle, MD;  Location: [facility];  Service: Endoscopy;  Laterality: N/A;   KNEE ARTHROSCOPY Left    left   NECK SURGERY     disectomy   PERIPHERAL VASCULAR CATHETERIZATION Right 04/05/2016   Procedure: Lower Extremity Angiography;  Surgeon: Rosetta Posner, MD;  Location: [place] CV LAB;  Service: Cardiovascular;  Laterality: Right;   PERIPHERAL VASCULAR CATHETERIZATION  N/A 04/05/2016   Procedure: Abdominal Aortogram;  Surgeon: Rosetta Posner, MD;  Location: [place] CV LAB;  Service: Cardiovascular;  Laterality: N/A;   RIGHT/LEFT HEART CATH AND CORONARY ANGIOGRAPHY N/A 02/07/2019   Procedure: RIGHT/LEFT HEART CATH AND CORONARY ANGIOGRAPHY;  Surgeon: Jolaine Artist, MD;  Location: [place] CV LAB;  Service: Cardiovascular;  Laterality: N/A;   VEIN HARVEST Right 04/07/2016   Procedure: VEIN HARVEST GREATER SAPHENOUS VIEN ;  Surgeon: Angelia Mould, MD;  Location: [facility] OR;  Service: Vascular;  Laterality: Right;   HPI:  Pt is a 80 y/o male admitted 10/12 secondary to SOB, and BLE due to inability to ambulate thought to be secondary to Acute renal failure. CXR Cardiac enlargement with bilateral pleural effusions. Perihilar infiltration is likely edema. Failed RN swallow screen; cough with water and applesauce. PMH includes CKD, HTN, CHF, R transmet amputation, DM, a fib and CVA 2015. No prio ST swallow documentation.    Assessment / Plan / Recommendation  Clinical Impression  Pt is cachectic, appears weak, deconditioned and obvious indications of dysfunctional swallow mechanism. Followed commands for oral-motor revealing gneralized weak movements. Upper dentures ill fitting due to lack of  adhesive; lower dentures fit properly. His volitional cough is weak, congested. Oral transit if delayed and once propeled swallow appears incordinated, is audible, multiple swallows and immediatate throat clear and delayed cough. Congested respirations after po's. Wife reports pt has had dysphagia since stroke in  2017 and coughs if takes large sips. Per RN, daughter reported he has been coughing more with po's recently (wife denied this). At present pt was comfort care yesterday but was removed yesterday. Therapists suspects objective measure with MBS will be helpful in diagnosing current swallow ability and plan- wife in agreement and MBS scheduled for 1:30 today- if plans  change and family wishes to proceed with comfort care and feeds, this can be canceled and therapist can assist with recommendations. SLP Visit Diagnosis: Dysphagia, unspecified (R13.10)    Aspiration Risk  Severe aspiration risk;Risk for inadequate nutrition/hydration    Diet Recommendation NPO        Other  Recommendations Oral Care Recommendations: Oral care QID    Recommendations for follow up therapy are one component of a multi-disciplinary discharge planning process, led by the attending physician.  Recommendations may be updated based on patient status, additional functional criteria and insurance authorization.  Follow up Recommendations  (TBD)      Frequency and Duration            Prognosis        Swallow Study   General Date of Onset: 07/07/2021 HPI: Pt is a 80 y/o male admitted 10/12 secondary to SOB, and BLE due to inability to ambulate thought to be secondary to Acute renal failure. CXR Cardiac enlargement with bilateral pleural effusions. Perihilar infiltration is likely edema. Failed RN swallow screen; cough with water and applesauce. PMH includes CKD, HTN, CHF, R transmet amputation, DM, a fib and CVA 2015. No prio ST swallow documentation. Type of Study: Bedside Swallow Evaluation Previous Swallow Assessment:  (no) Diet Prior to this Study: NPO Temperature Spikes Noted: No Respiratory Status: Room air History of Recent Intubation: No Behavior/Cognition: Alert;Cooperative;Pleasant mood;Confused;Requires cueing Oral Cavity Assessment:  (mild lingual discoloration) Oral Care Completed by SLP: Yes Oral Cavity - Dentition: Dentures, top;Dentures, bottom;Edentulous Vision: Functional for self-feeding Self-Feeding Abilities: Needs assist Patient Positioning: Upright in bed Baseline Vocal Quality: Low vocal intensity Volitional Cough: Weak;Congested    Oral/Motor/Sensory Function Overall Oral Motor/Sensory Function: Generalized oral weakness   Ice Chips Ice  chips: Not tested   Thin Liquid Thin Liquid: Impaired Presentation: Cup Oral Phase Impairments: Reduced labial seal Oral Phase Functional Implications: Prolonged oral transit Pharyngeal  Phase Impairments: Multiple swallows;Cough - Delayed    Nectar Thick Nectar Thick Liquid: Not tested   Honey Thick Honey Thick Liquid: Not tested   Puree Puree: Impaired Presentation: Spoon Oral Phase Functional Implications: Prolonged oral transit Pharyngeal Phase Impairments: Throat Clearing - Immediate;Cough - Delayed   Solid     Solid: Not tested      Houston Siren 07/08/2021,10:26 AM Orbie Pyo Colvin Caroli.Ed Risk analyst [phone] Office [phone]

## 2021-07-08 NOTE — Evaluation (Signed)
Occupational Therapy Evaluation Patient Details Name: [name] MRN: [id number] DOB: 1941-08-20 Today's Date: 07/08/2021   History of Present Illness Pt is a 80 y/o male admitted 10/12 secondary to SOB, and BLE. Has been unable to ambulate as well. Thought to be secondary to Acute renal failure. PMH includes CKD, HTN, CHF, R transmet amputation, DM, a fib and CVA.   Clinical Impression   Pt evaluated this date with wife present in the room. Pt's wife reports wanting to keep pt comfortable and recently discussing pt's transition to comfort care with palliative staff prior to OT arrival. Therapist educated pt and wife on proper positioning for feeding (if able), comfort, pressure area prevention and proper assistance with bed mobility for hygiene. Pt's wife demonstrated understanding. Pt with transition to comfort care today. OT will sign off at this time. Thank you for referral.      Recommendations for follow up therapy are one component of a multi-disciplinary discharge planning process, led by the attending physician.  Recommendations may be updated based on patient status, additional functional criteria and insurance authorization.   Follow Up Recommendations  No OT follow up    Equipment Recommendations  Other (comment) (lift equipment)    Recommendations for Other Services       Precautions / Restrictions Precautions Precautions: Fall Restrictions Weight Bearing Restrictions: No      Mobility Bed Mobility Overal bed mobility: Needs Assistance Bed Mobility: Rolling Rolling: Max assist         General bed mobility comments: maxA to roll R<>L in bed for repositioning and educating pt's wife on properly assisting pt with rolling and importance of positional changes to prevent pressure areas    Transfers                 General transfer comment: did not assess due to pain, pt would require lift equipment    Balance                                            ADL either performed or assessed with clinical judgement   ADL Overall ADL's : Needs assistance/impaired                                       General ADL Comments: pt currently requiring maximum to total assistance for all ADL;pt limited by lethargy, pain and weakness     Vision         Perception     Praxis      Pertinent Vitals/Pain Pain Assessment: Faces Faces Pain Scale: Hurts whole lot Pain Location: with repositioning Pain Descriptors / Indicators: Discomfort Pain Intervention(s): Monitored during session;Limited activity within patient's tolerance     Hand Dominance Right   Extremity/Trunk Assessment Upper Extremity Assessment Upper Extremity Assessment: Generalized weakness   Lower Extremity Assessment Lower Extremity Assessment: Defer to PT evaluation RLE Deficits / Details: previous midfoot amputation;limited ROM secondary to increased pain LLE Deficits / Details: Increased swelling noted and very limited ROM secondary to pain       Communication Communication Communication: Expressive difficulties   Cognition Arousal/Alertness: Lethargic Behavior During Therapy: Flat affect Overall Cognitive Status: Difficult to assess  General Comments: pt answering questions but very lethargic, keeping eyes shut majority of session but appeared to be listening to therapist and knodding his head appropriately.   General Comments  educated pt's wife on properly assisting pt with bed mobility, proper positioning, importance of keeping pt independent with ADL (grooming/feeding if able) and importance of positional changes for pressure relief.    Exercises     Shoulder Instructions      Home Living Family/patient expects to be discharged to:: Private residence Living Arrangements: Spouse/significant other Available Help at Discharge: Family;Available 24 hours/day Type of Home:  House Home Access: Level entry     Home Layout: Two level;Able to live on main level with bedroom/bathroom     Bathroom Shower/Tub: Tub/shower unit   Bathroom Toilet: Handicapped height     Home Equipment: Environmental consultant - 2 wheels;Cane - single point;Wheelchair - manual;Bedside commode          Prior Functioning/Environment Level of Independence: Needs assistance  Gait / Transfers Assistance Needed: Pt's daughters report pt has had progressive decline with mobility the past 4 months and has not been wanting to get up. Per wife, pt was able to ambulate prior to 2 days ago, but in the past two days has had to use WC ADL's / Homemaking Assistance Needed: Wife has to assist with ADLs.   Comments: has a remodeled walk in tub - seat, grab bars and LH shower.        OT Problem List: Decreased activity tolerance;Decreased strength;Decreased range of motion;Pain;Other (comment) (end of life)      OT Treatment/Interventions:      OT Goals(Current goals can be found in the care plan section) Acute Rehab OT Goals Patient Stated Goal: for pt to be comfortable and return home OT Goal Formulation: With patient/family Time For Goal Achievement: 07/15/21 Potential to Achieve Goals: Poor  OT Frequency:     Barriers to D/C:            Co-evaluation              AM-PAC OT "6 Clicks" Daily Activity     Outcome Measure Help from another person eating meals?: A Lot Help from another person taking care of personal grooming?: A Lot Help from another person toileting, which includes using toliet, bedpan, or urinal?: Total Help from another person bathing (including washing, rinsing, drying)?: Total Help from another person to put on and taking off regular upper body clothing?: Total Help from another person to put on and taking off regular lower body clothing?: Total 6 Click Score: 8   End of Session Nurse Communication: Mobility status  Activity Tolerance: Patient limited by  pain Patient left: in bed;with call bell/phone within reach;with bed alarm set;with family/visitor present  OT Visit Diagnosis: Other abnormalities of gait and mobility (R26.89);Pain Pain - Right/Left:  (generalized and BLE)                Time: 5974-1638 OT Time Calculation (min): 34 min Charges:  OT General Charges $OT Visit: 1 Visit OT Evaluation $OT Eval Moderate Complexity: 1 Mod OT Treatments $Self Care/Home Management : 8-22 mins  Helene Kelp OTR/L Acute Rehabilitation Services Office: [phone]   Wyn Forster 07/08/2021, 12:32 PM

## 2021-07-09 DIAGNOSIS — N170 Acute kidney failure with tubular necrosis: Secondary | ICD-10-CM | POA: Diagnosis not present

## 2021-07-09 DIAGNOSIS — N184 Chronic kidney disease, stage 4 (severe): Secondary | ICD-10-CM | POA: Diagnosis not present

## 2021-07-13 ENCOUNTER — Other Ambulatory Visit: Payer: Self-pay | Admitting: *Deleted

## 2021-07-13 NOTE — Patient Outreach (Signed)
[facility]) Care Management  07/13/2021  [name] 08-29-1941 [id number]   Pt is deceased and case is closed from further case management services.  [name], RN Care Management Coordinator [place] Office [phone]

## 2021-07-19 ENCOUNTER — Telehealth: Payer: Self-pay | Admitting: Podiatry

## 2021-07-19 NOTE — Telephone Encounter (Signed)
Pts wife called to cancel the diabetic shoes that were ordered because pt passed away.  I have contacted safestep to stop the order.  I did give our condolences for her loss.

## 2021-07-26 NOTE — Progress Notes (Signed)
Family left at this time, per Chaplain no family to return or still to come.

## 2021-07-26 NOTE — Death Summary Note (Signed)
[facility] Death Note                                                                                                                                                                                               [name], is a 80 y.o. male, DOB - 08/27/1941, PYK:[id number]  Admit date - 07/05/2021   Admitting Physician Kristopher Oppenheim, DO  Outpatient Primary MD for the patient is Avva, Ravisankar, MD  LOS - 2  Chief Complaint  Patient presents with   Shortness of Breath   Leg Swelling       Notification: Prince Solian, MD notified of death of 07/12/21   Date and Time of Death - 07/12/2021 at 02.44 am  Pronounced by - RN  History of present illness:   [name] is a 80 y.o. 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  In the ER diagnosed with AKI, priapism, severe cachexia and was admitted to the hospital.  He was in very poor state of health, he was seen by nephrology, cardiology and palliative care team, consensus was made that his prognosis was very poor and that he would benefit from comfort measures, he was transition to comfort measures evening of 07-11-2021 and passed away comfortably on July 12, 2021 early morning.   Final Diagnoses:  Cause if death - CHF, CKD  Signature  Lala Lund M.D on 2021/07/12 at 6:55 AM  [facility]  Office Phone [phone]  Total clinical and documentation time for today Under 30 minutes   Last Note                                                                      PROGRESS NOTE  Patient Demographics:    [name], is a 80 y.o. 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  In the ER diagnosed with AKI, priapism, severe cachexia and was admitted to the hospital.   Subjective:   Patient in bed, appears comfortable, denies any headache, no fever, no chest pain or pressure, no shortness of breath , no abdominal pain. No new focal weakness.    Assessment  & Plan :      Acute renal failure on top of CKD stage IV.  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  For now supportive care, Pall Care following, unfortunately wife has some unrealistic expectations and wants to continue present Rx, has failed swallow eval by SLP - monitor, extremely poor prognosis. Note Renal and Cards agree with Comfort care.  2.  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  Does have some third spacing of fluids but appears intravascularly dehydrated, CHF team agrees with comfort care, poor candidate for inotropes due to his underlying deconditioning and severe cachexia.  Likely will benefit from comfort measures.  3.  History of stroke.  On dual antiplatelet therapy and statin.  4.  Failure to thrive, ongoing weight loss, severe cachexia, severe protein calorie malnutrition with third spacing of fluid.  Supportive care. SLP following, failed swallow,  palliative care on board.  5.  History of right transmetatarsal amputation, ulcers.  Supportive care.  Neosporin ointment 3 times daily.  6.  Metabolic and toxic encephalopathy.  Due to worsening renal failure along with recent administration of narcotics.  No focal deficits.  Will monitor.  Minimize narcotics and benzo use.    7. Hyperkalemia - NPO, hence Kayexalate enema, Fluids + lasix.  8. DM type II.  Sliding scale.   CBG (last 3)  Recent Labs    07/07/21 1544 07/07/21 1936 07/08/21 0739  GLUCAP 137* 124* 113*        Condition - Extremely Guarded  Family Communication  : Wife bedside 07/07/21, 07/08/21  Code Status :  DNR  Consults  :  Pall Care, Cards, Renal  PUD Prophylaxis :    Procedures  :            Disposition Plan  :    Status is: Inpatient  Remains inpatient appropriate because:IV treatments appropriate due to intensity of illness or inability to take PO  Dispo: The patient is from: Home              Anticipated d/c is  to: Home              Patient currently is not medically stable to d/c.   Difficult to place patient No   DVT Prophylaxis  :    Place and maintain sequential compression device Start: 07/08/21 0836    Lab Results  Component Value Date   PLT 42 (L) 07/08/2021    Diet :  Diet Order             Diet NPO time specified Except for: Sips with Meds  Diet effective now                    Inpatient  Medications  Scheduled Meds:  glycopyrrolate  0.4 mg Intravenous Q4H   mouth rinse  15 mL Mouth Rinse BID   neomycin-bacitracin-polymyxin   Topical TID   Continuous Infusions:   PRN Meds:.acetaminophen **OR** acetaminophen, antiseptic oral rinse, bisacodyl, diphenhydrAMINE, HYDROmorphone (DILAUDID) injection, LORazepam **OR** LORazepam, ondansetron (ZOFRAN) IV, polyvinyl alcohol, traMADol  Antibiotics  :    Anti-infectives (From admission, onward)    Start     Dose/Rate Route Frequency Ordered Stop   07/08/21 1000  ceFAZolin (ANCEF) IVPB 1 g/50 mL premix  Status:  Discontinued        1 g 100 mL/hr over 30 Minutes Intravenous Every 12 hours 07/07/21 1747 07/08/21 1202   07/07/21 0600  ceFAZolin (ANCEF) IVPB 1 g/50 mL premix  Status:  Discontinued        1 g 100 mL/hr over 30 Minutes Intravenous Every 8 hours 07/07/21 0502 07/07/21 1747        Time Spent in minutes  30   Lala Lund M.D on 07/14/21 at 6:55 AM  To page go to [email or web address]   [facility] -  Office  [phone]  See all Orders from today for further details    Objective:   Vitals:   07/08/21 0605 07/08/21 0615 07/08/21 0745 07-14-21 0245  BP: 98/77  104/74   Pulse: 100  100   Resp: 14  17   Temp: 97.6 F (36.4 C)  98.1 F (36.7 C)   TempSrc:   Oral   SpO2: 94%  95%   Weight:  72.1 kg  72.1 kg  Height:    5\' 9"  (1.753 m)    Wt Readings from Last 3 Encounters:  07-14-2021 72.1 kg  04/27/21 63.7 kg  03/04/21 67.7 kg     Intake/Output Summary (Last 24 hours) at 07/14/2021 0655 Last data filed at 07/08/2021 1700 Gross per 24 hour  Intake 0 ml  Output --  Net 0 ml     Physical Exam  Cachectic, more awake , moves all 4 extremities by self [place].AT,PERRAL Supple Neck, No JVD,   Symmetrical Chest wall movement, Good air movement bilaterally, CTAB RRR,No Gallops, Rubs or new Murmurs,  +ve B.Sounds, Abd Soft, No tenderness,   R transmetatarsal amputation, L leg has 1+ edema with a  healing laceration on the dorsal lateral foot skin   Data Review:    CBC Recent Labs  Lab 07/07/21 0005 07/07/21 1200 07/08/21 0146  WBC 9.5 11.8* 11.6*  HGB 18.0* 18.0* 17.8*  HCT 50.7 51.0 50.8  PLT 63* 49* 42*  MCV 89.9 91.1 88.7  MCH 31.9 32.1 31.1  MCHC 35.5 35.3 35.0  RDW 21.6* 22.3* 21.4*  LYMPHSABS 0.2*  --  0.2*  MONOABS 0.7  --  0.8  EOSABS 0.0  --  0.0  BASOSABS 0.1  --  0.0    Recent Labs  Lab 07/07/21 0005 07/07/21 1200 07/08/21 0146  NA 133*  --  134*  K 5.4*  --  6.0*  CL 96*  --  98  CO2 19*  --  18*  GLUCOSE 145*  --  126*  BUN 132*  --  142*  CREATININE 4.81* 4.84* 4.89*  CALCIUM 8.9  --  8.6*  AST 68*  --  42*  ALT 50*  --  39  ALKPHOS 149*  --  130*  BILITOT 2.8*  --  2.8*  ALBUMIN 2.6*  --  2.5*  MG  --   --  2.9*  HGBA1C  --  7.6*  --   BNP >4,500.0*  --  >4,500.0*    ------------------------------------------------------------------------------------------------------------------ No results for input(s): CHOL, HDL, LDLCALC, TRIG, CHOLHDL, LDLDIRECT in the last 72 hours.  Lab Results  Component Value Date   HGBA1C 7.6 (H) 07/07/2021   ------------------------------------------------------------------------------------------------------------------ No results for input(s): TSH, T4TOTAL, T3FREE, THYROIDAB in the last 72 hours.  Invalid input(s): FREET3  Cardiac Enzymes No results for input(s): CKMB, TROPONINI, MYOGLOBIN in the last 168 hours.  Invalid input(s): CK ------------------------------------------------------------------------------------------------------------------    Component Value Date/Time   BNP >4,500.0 (H) 07/08/2021 0146     Radiology Reports DG Chest Port 1 View  Result Date: 07/07/2021 CLINICAL DATA:  Shortness of breath EXAM: PORTABLE CHEST 1 VIEW COMPARISON:  01/18/2021 FINDINGS: Shallow inspiration with elevation of the right hemidiaphragm. Probable small bilateral pleural effusions. Cardiac  enlargement. Bilateral perihilar infiltrates, greater on the right, likely edema. No pneumothorax. IMPRESSION: Cardiac enlargement with bilateral pleural effusions. Perihilar infiltration is likely edema. Electronically Signed   By: Lucienne Capers M.D.   On: 07/07/2021 00:32

## 2021-07-26 NOTE — Progress Notes (Signed)
   2021-07-19 0246  Clinical Encounter Type  Visited With Patient and family together  Visit Type Initial;Death  Referral From Nurse  Consult/Referral To Chaplain  Spiritual Encounters  Spiritual Needs Emotional;Grief support   Chaplain responded to page for death. Pt's wife, [name], was at bedside and Pt's daughter, Elzie Rings, brother-in-law, Marinus Maw, and Otilio Jefferson arrived later. Chaplain engaged active listening and provided emotional and grief support. Chaplain encouraged family's story-telling and remembering Pt. Chaplain brought Pt's family drinks. Chaplain stayed with Pt's family until they left the room.  This note was prepared by Marijo File, MDiv. Chaplain remains available as needed through the on-call pager: [phone].

## 2021-07-26 NOTE — Progress Notes (Signed)
This RN called into room by patient's family.  Per family pt not breathing.  This RN and Mavis, RN assessed patient for breath sounds and heart sounds.  None present.  Time of Death at 44.

## 2021-07-26 DEATH — deceased

## 2021-07-29 ENCOUNTER — Ambulatory Visit: Payer: Medicare HMO | Admitting: *Deleted

## 2021-08-01 ENCOUNTER — Encounter (HOSPITAL_COMMUNITY): Payer: Medicare HMO | Admitting: Internal Medicine

## 2021-08-15 ENCOUNTER — Ambulatory Visit: Payer: Medicare HMO | Admitting: Podiatry
